# Patient Record
Sex: Male | Born: 1940
Health system: Southern US, Community
[De-identification: ages and names within clinical notes are randomized; demographics above are authoritative.]

## PROBLEM LIST (undated history)

## (undated) ENCOUNTER — Emergency Department (HOSPITAL_COMMUNITY): Admission: EM | Discharge status: Absent without leave | Payer: Medicare Other

## (undated) DIAGNOSIS — R3914 Feeling of incomplete bladder emptying: Secondary | ICD-10-CM

## (undated) DIAGNOSIS — J439 Emphysema, unspecified: Secondary | ICD-10-CM

## (undated) DIAGNOSIS — K219 Gastro-esophageal reflux disease without esophagitis: Secondary | ICD-10-CM

## (undated) DIAGNOSIS — E119 Type 2 diabetes mellitus without complications: Secondary | ICD-10-CM

## (undated) DIAGNOSIS — N200 Calculus of kidney: Secondary | ICD-10-CM

## (undated) DIAGNOSIS — Z923 Personal history of irradiation: Secondary | ICD-10-CM

## (undated) DIAGNOSIS — F419 Anxiety disorder, unspecified: Secondary | ICD-10-CM

## (undated) DIAGNOSIS — I714 Abdominal aortic aneurysm, without rupture: Secondary | ICD-10-CM

## (undated) DIAGNOSIS — M48061 Spinal stenosis, lumbar region without neurogenic claudication: Secondary | ICD-10-CM

## (undated) DIAGNOSIS — C61 Malignant neoplasm of prostate: Secondary | ICD-10-CM

## (undated) DIAGNOSIS — Z77098 Contact with and (suspected) exposure to other hazardous, chiefly nonmedicinal, chemicals: Secondary | ICD-10-CM

## (undated) DIAGNOSIS — M810 Age-related osteoporosis without current pathological fracture: Secondary | ICD-10-CM

## (undated) DIAGNOSIS — F32A Depression, unspecified: Secondary | ICD-10-CM

## (undated) DIAGNOSIS — Z973 Presence of spectacles and contact lenses: Secondary | ICD-10-CM

## (undated) DIAGNOSIS — R0609 Other forms of dyspnea: Secondary | ICD-10-CM

## (undated) DIAGNOSIS — K449 Diaphragmatic hernia without obstruction or gangrene: Secondary | ICD-10-CM

## (undated) DIAGNOSIS — C3492 Malignant neoplasm of unspecified part of left bronchus or lung: Secondary | ICD-10-CM

## (undated) DIAGNOSIS — F329 Major depressive disorder, single episode, unspecified: Secondary | ICD-10-CM

## (undated) DIAGNOSIS — M109 Gout, unspecified: Secondary | ICD-10-CM

## (undated) DIAGNOSIS — E1140 Type 2 diabetes mellitus with diabetic neuropathy, unspecified: Secondary | ICD-10-CM

## (undated) DIAGNOSIS — Z87442 Personal history of urinary calculi: Secondary | ICD-10-CM

## (undated) DIAGNOSIS — E785 Hyperlipidemia, unspecified: Secondary | ICD-10-CM

## (undated) DIAGNOSIS — N401 Enlarged prostate with lower urinary tract symptoms: Secondary | ICD-10-CM

## (undated) DIAGNOSIS — Z972 Presence of dental prosthetic device (complete) (partial): Secondary | ICD-10-CM

## (undated) DIAGNOSIS — Z87448 Personal history of other diseases of urinary system: Secondary | ICD-10-CM

## (undated) DIAGNOSIS — R0600 Dyspnea, unspecified: Secondary | ICD-10-CM

## (undated) DIAGNOSIS — C3491 Malignant neoplasm of unspecified part of right bronchus or lung: Secondary | ICD-10-CM

## (undated) DIAGNOSIS — J701 Chronic and other pulmonary manifestations due to radiation: Secondary | ICD-10-CM

## (undated) DIAGNOSIS — Z8679 Personal history of other diseases of the circulatory system: Secondary | ICD-10-CM

## (undated) DIAGNOSIS — N323 Diverticulum of bladder: Secondary | ICD-10-CM

## (undated) DIAGNOSIS — Z86718 Personal history of other venous thrombosis and embolism: Secondary | ICD-10-CM

## (undated) DIAGNOSIS — Z860100 Personal history of colon polyps, unspecified: Secondary | ICD-10-CM

## (undated) DIAGNOSIS — I10 Essential (primary) hypertension: Secondary | ICD-10-CM

## (undated) DIAGNOSIS — Z8719 Personal history of other diseases of the digestive system: Secondary | ICD-10-CM

## (undated) DIAGNOSIS — M1990 Unspecified osteoarthritis, unspecified site: Secondary | ICD-10-CM

## (undated) DIAGNOSIS — H259 Unspecified age-related cataract: Secondary | ICD-10-CM

## (undated) DIAGNOSIS — I7143 Infrarenal abdominal aortic aneurysm, without rupture: Secondary | ICD-10-CM

## (undated) DIAGNOSIS — K5909 Other constipation: Secondary | ICD-10-CM

## (undated) DIAGNOSIS — N319 Neuromuscular dysfunction of bladder, unspecified: Secondary | ICD-10-CM

## (undated) DIAGNOSIS — N529 Male erectile dysfunction, unspecified: Secondary | ICD-10-CM

## (undated) DIAGNOSIS — Z7739 Contact with and (suspected) exposure to other war theater: Secondary | ICD-10-CM

## (undated) HISTORY — PX: CYSTOLITHOTOMY: SHX1421

## (undated) HISTORY — PX: CYSTOSCOPY W/ LITHOLAPAXY / EHL: SUR377

## (undated) HISTORY — DX: Diaphragmatic hernia without obstruction or gangrene: K44.9

## (undated) HISTORY — DX: Male erectile dysfunction, unspecified: N52.9

## (undated) HISTORY — DX: Age-related osteoporosis without current pathological fracture: M81.0

## (undated) HISTORY — PX: COLONOSCOPY: SHX174

## (undated) HISTORY — DX: Essential (primary) hypertension: I10

## (undated) HISTORY — DX: Personal history of colonic polyps: Z86.010

## (undated) HISTORY — DX: Personal history of colon polyps, unspecified: Z86.0100

## (undated) HISTORY — DX: Spinal stenosis, lumbar region without neurogenic claudication: M48.061

## (undated) HISTORY — DX: Hyperlipidemia, unspecified: E78.5

## (undated) HISTORY — DX: Unspecified osteoarthritis, unspecified site: M19.90

## (undated) HISTORY — PX: OTHER SURGICAL HISTORY: SHX169

---

## 2003-03-12 ENCOUNTER — Emergency Department (HOSPITAL_COMMUNITY): Admission: EM | Admit: 2003-03-12 | Discharge: 2003-03-12 | Payer: Self-pay | Admitting: Emergency Medicine

## 2004-01-31 ENCOUNTER — Ambulatory Visit: Payer: Self-pay | Admitting: Family Medicine

## 2004-11-13 ENCOUNTER — Ambulatory Visit: Payer: Self-pay | Admitting: Family Medicine

## 2004-11-16 ENCOUNTER — Encounter: Payer: Self-pay | Admitting: Family Medicine

## 2004-11-17 ENCOUNTER — Encounter (INDEPENDENT_AMBULATORY_CARE_PROVIDER_SITE_OTHER): Payer: Self-pay | Admitting: *Deleted

## 2004-11-17 LAB — CONVERTED CEMR LAB: PSA: 0.64 ng/mL

## 2004-11-23 ENCOUNTER — Ambulatory Visit: Payer: Self-pay | Admitting: Family Medicine

## 2004-11-28 ENCOUNTER — Ambulatory Visit: Payer: Self-pay | Admitting: Family Medicine

## 2004-12-11 ENCOUNTER — Ambulatory Visit (HOSPITAL_COMMUNITY): Admission: RE | Admit: 2004-12-11 | Discharge: 2004-12-11 | Payer: Self-pay | Admitting: Family Medicine

## 2004-12-11 ENCOUNTER — Ambulatory Visit: Payer: Self-pay | Admitting: *Deleted

## 2005-03-13 ENCOUNTER — Ambulatory Visit: Payer: Self-pay | Admitting: Family Medicine

## 2005-07-11 ENCOUNTER — Ambulatory Visit: Payer: Self-pay | Admitting: Family Medicine

## 2005-11-12 ENCOUNTER — Ambulatory Visit: Payer: Self-pay | Admitting: Family Medicine

## 2006-05-02 ENCOUNTER — Emergency Department (HOSPITAL_COMMUNITY): Admission: EM | Admit: 2006-05-02 | Discharge: 2006-05-02 | Payer: Self-pay | Admitting: Emergency Medicine

## 2006-05-06 ENCOUNTER — Ambulatory Visit: Payer: Self-pay | Admitting: Family Medicine

## 2006-07-22 ENCOUNTER — Ambulatory Visit: Payer: Self-pay | Admitting: Family Medicine

## 2006-07-23 ENCOUNTER — Encounter: Payer: Self-pay | Admitting: Family Medicine

## 2006-10-07 ENCOUNTER — Ambulatory Visit: Payer: Self-pay | Admitting: Family Medicine

## 2006-10-30 ENCOUNTER — Ambulatory Visit: Payer: Self-pay | Admitting: Family Medicine

## 2006-11-11 ENCOUNTER — Encounter: Payer: Self-pay | Admitting: Family Medicine

## 2006-11-11 LAB — CONVERTED CEMR LAB
BUN: 17 mg/dL (ref 6–23)
CO2: 25 meq/L (ref 19–32)
Calcium: 9.3 mg/dL (ref 8.4–10.5)
Chloride: 102 meq/L (ref 96–112)
Cholesterol: 171 mg/dL (ref 0–200)
Creatinine, Ser: 1.43 mg/dL (ref 0.40–1.50)
Glucose, Bld: 95 mg/dL (ref 70–99)
HDL: 35 mg/dL — ABNORMAL LOW (ref >39)
LDL Cholesterol: 108 mg/dL — ABNORMAL HIGH (ref 0–99)
Potassium: 3.7 meq/L (ref 3.5–5.3)
Sodium: 138 meq/L (ref 135–145)
Total CHOL/HDL Ratio: 4.9
Triglycerides: 140 mg/dL (ref <150)
VLDL: 28 mg/dL (ref 0–40)

## 2006-12-03 ENCOUNTER — Ambulatory Visit: Payer: Self-pay | Admitting: Family Medicine

## 2007-01-06 ENCOUNTER — Ambulatory Visit: Payer: Self-pay | Admitting: Family Medicine

## 2007-02-24 ENCOUNTER — Ambulatory Visit (HOSPITAL_BASED_OUTPATIENT_CLINIC_OR_DEPARTMENT_OTHER): Admission: RE | Admit: 2007-02-24 | Discharge: 2007-02-24 | Payer: Self-pay | Admitting: Urology

## 2007-02-25 ENCOUNTER — Ambulatory Visit: Payer: Self-pay | Admitting: Family Medicine

## 2007-04-15 ENCOUNTER — Ambulatory Visit: Payer: Self-pay | Admitting: Family Medicine

## 2007-07-22 ENCOUNTER — Encounter: Payer: Self-pay | Admitting: *Deleted

## 2007-07-22 ENCOUNTER — Ambulatory Visit: Payer: Self-pay | Admitting: Family Medicine

## 2007-07-22 ENCOUNTER — Encounter (INDEPENDENT_AMBULATORY_CARE_PROVIDER_SITE_OTHER): Payer: Self-pay | Admitting: *Deleted

## 2007-07-22 DIAGNOSIS — I10 Essential (primary) hypertension: Secondary | ICD-10-CM | POA: Insufficient documentation

## 2007-07-22 DIAGNOSIS — E669 Obesity, unspecified: Secondary | ICD-10-CM | POA: Insufficient documentation

## 2007-07-22 DIAGNOSIS — F528 Other sexual dysfunction not due to a substance or known physiological condition: Secondary | ICD-10-CM | POA: Insufficient documentation

## 2007-07-22 DIAGNOSIS — E785 Hyperlipidemia, unspecified: Secondary | ICD-10-CM | POA: Insufficient documentation

## 2007-07-28 ENCOUNTER — Ambulatory Visit (HOSPITAL_COMMUNITY): Admission: RE | Admit: 2007-07-28 | Discharge: 2007-07-28 | Payer: Self-pay | Admitting: Family Medicine

## 2007-08-11 ENCOUNTER — Ambulatory Visit: Payer: Self-pay | Admitting: Family Medicine

## 2007-08-11 ENCOUNTER — Encounter: Payer: Self-pay | Admitting: Family Medicine

## 2007-08-12 DIAGNOSIS — F17200 Nicotine dependence, unspecified, uncomplicated: Secondary | ICD-10-CM | POA: Insufficient documentation

## 2007-09-12 ENCOUNTER — Ambulatory Visit: Payer: Self-pay | Admitting: Family Medicine

## 2007-09-12 LAB — CONVERTED CEMR LAB
Basophils Absolute: 0 10*3/uL (ref 0.0–0.1)
Basophils Relative: 1 % (ref 0–1)
Eosinophils Absolute: 0.2 10*3/uL (ref 0.0–0.7)
Eosinophils Relative: 3 % (ref 0–5)
HCT: 43.9 % (ref 39.0–52.0)
Hemoglobin: 14.3 g/dL (ref 13.0–17.0)
Lymphocytes Relative: 37 % (ref 12–46)
Lymphs Abs: 2.3 10*3/uL (ref 0.7–4.0)
MCHC: 32.6 g/dL (ref 30.0–36.0)
MCV: 98.7 fL (ref 78.0–100.0)
Monocytes Absolute: 0.5 10*3/uL (ref 0.1–1.0)
Monocytes Relative: 7 % (ref 3–12)
Neutro Abs: 3.3 10*3/uL (ref 1.7–7.7)
Neutrophils Relative %: 53 % (ref 43–77)
PSA: 0.74 ng/mL (ref 0.10–4.00)
Platelets: 165 10*3/uL (ref 150–400)
RBC: 4.45 M/uL (ref 4.22–5.81)
RDW: 14.4 % (ref 11.5–15.5)
WBC: 6.2 10*3/uL (ref 4.0–10.5)

## 2007-09-15 ENCOUNTER — Encounter: Payer: Self-pay | Admitting: Family Medicine

## 2007-09-15 LAB — CONVERTED CEMR LAB: Hgb A1c MFr Bld: 6.2 % — ABNORMAL HIGH (ref 4.6–6.1)

## 2007-09-25 ENCOUNTER — Ambulatory Visit (HOSPITAL_COMMUNITY): Payer: Self-pay | Admitting: Psychiatry

## 2007-10-11 ENCOUNTER — Emergency Department (HOSPITAL_COMMUNITY): Admission: EM | Admit: 2007-10-11 | Discharge: 2007-10-11 | Payer: Self-pay | Admitting: Emergency Medicine

## 2007-10-14 ENCOUNTER — Ambulatory Visit (HOSPITAL_COMMUNITY): Admission: RE | Admit: 2007-10-14 | Discharge: 2007-10-14 | Payer: Self-pay | Admitting: Family Medicine

## 2007-10-14 ENCOUNTER — Ambulatory Visit: Payer: Self-pay | Admitting: Family Medicine

## 2007-10-16 ENCOUNTER — Ambulatory Visit (HOSPITAL_COMMUNITY): Payer: Self-pay | Admitting: Psychiatry

## 2007-11-13 ENCOUNTER — Ambulatory Visit (HOSPITAL_COMMUNITY): Payer: Self-pay | Admitting: Psychiatry

## 2007-12-05 DIAGNOSIS — N319 Neuromuscular dysfunction of bladder, unspecified: Secondary | ICD-10-CM | POA: Insufficient documentation

## 2008-02-13 ENCOUNTER — Telehealth: Payer: Self-pay | Admitting: Family Medicine

## 2008-02-27 ENCOUNTER — Ambulatory Visit: Payer: Self-pay | Admitting: Family Medicine

## 2008-02-27 DIAGNOSIS — F3289 Other specified depressive episodes: Secondary | ICD-10-CM | POA: Insufficient documentation

## 2008-02-27 DIAGNOSIS — F329 Major depressive disorder, single episode, unspecified: Secondary | ICD-10-CM | POA: Insufficient documentation

## 2008-02-27 DIAGNOSIS — R079 Chest pain, unspecified: Secondary | ICD-10-CM | POA: Insufficient documentation

## 2008-03-02 ENCOUNTER — Encounter: Payer: Self-pay | Admitting: Family Medicine

## 2008-03-02 ENCOUNTER — Telehealth: Payer: Self-pay | Admitting: Family Medicine

## 2008-03-15 ENCOUNTER — Ambulatory Visit: Payer: Self-pay | Admitting: Cardiology

## 2008-04-01 ENCOUNTER — Encounter: Payer: Self-pay | Admitting: Family Medicine

## 2008-04-01 ENCOUNTER — Ambulatory Visit: Payer: Self-pay | Admitting: Family Medicine

## 2008-06-21 ENCOUNTER — Emergency Department (HOSPITAL_COMMUNITY): Admission: EM | Admit: 2008-06-21 | Discharge: 2008-06-21 | Payer: Self-pay | Admitting: Emergency Medicine

## 2008-07-20 ENCOUNTER — Ambulatory Visit: Payer: Self-pay | Admitting: Family Medicine

## 2008-07-20 DIAGNOSIS — R5383 Other fatigue: Secondary | ICD-10-CM

## 2008-07-20 DIAGNOSIS — N3000 Acute cystitis without hematuria: Secondary | ICD-10-CM | POA: Insufficient documentation

## 2008-07-20 DIAGNOSIS — R5381 Other malaise: Secondary | ICD-10-CM | POA: Insufficient documentation

## 2008-07-24 DIAGNOSIS — G4700 Insomnia, unspecified: Secondary | ICD-10-CM | POA: Insufficient documentation

## 2008-07-26 ENCOUNTER — Ambulatory Visit: Payer: Self-pay | Admitting: Family Medicine

## 2008-07-27 ENCOUNTER — Encounter: Payer: Self-pay | Admitting: Family Medicine

## 2008-07-28 ENCOUNTER — Encounter: Payer: Self-pay | Admitting: Family Medicine

## 2008-07-29 LAB — CONVERTED CEMR LAB
BUN: 15 mg/dL (ref 6–23)
CO2: 26 meq/L (ref 19–32)
Calcium: 9.2 mg/dL (ref 8.4–10.5)
Chloride: 104 meq/L (ref 96–112)
Cholesterol: 165 mg/dL (ref 0–200)
Creatinine, Ser: 1.21 mg/dL (ref 0.40–1.50)
Glucose, Bld: 99 mg/dL (ref 70–99)
HDL: 35 mg/dL — ABNORMAL LOW (ref >39)
LDL Cholesterol: 105 mg/dL — ABNORMAL HIGH (ref 0–99)
Potassium: 4.1 meq/L (ref 3.5–5.3)
Sodium: 137 meq/L (ref 135–145)
Total CHOL/HDL Ratio: 4.7
Triglycerides: 123 mg/dL (ref <150)
VLDL: 25 mg/dL (ref 0–40)

## 2008-07-30 ENCOUNTER — Telehealth: Payer: Self-pay | Admitting: Family Medicine

## 2008-08-09 ENCOUNTER — Telehealth: Payer: Self-pay | Admitting: Family Medicine

## 2008-09-02 ENCOUNTER — Telehealth: Payer: Self-pay | Admitting: Family Medicine

## 2008-09-03 ENCOUNTER — Encounter: Payer: Self-pay | Admitting: Family Medicine

## 2008-10-04 ENCOUNTER — Encounter: Payer: Self-pay | Admitting: Family Medicine

## 2008-10-11 ENCOUNTER — Ambulatory Visit: Payer: Self-pay | Admitting: Family Medicine

## 2008-10-11 DIAGNOSIS — N323 Diverticulum of bladder: Secondary | ICD-10-CM | POA: Insufficient documentation

## 2008-10-26 ENCOUNTER — Ambulatory Visit: Payer: Self-pay | Admitting: Family Medicine

## 2008-10-26 DIAGNOSIS — J449 Chronic obstructive pulmonary disease, unspecified: Secondary | ICD-10-CM | POA: Insufficient documentation

## 2008-10-27 ENCOUNTER — Encounter: Payer: Self-pay | Admitting: Family Medicine

## 2008-10-27 LAB — CONVERTED CEMR LAB
BUN: 13 mg/dL (ref 6–23)
Basophils Absolute: 0 10*3/uL (ref 0.0–0.1)
Basophils Relative: 1 % (ref 0–1)
CO2: 22 meq/L (ref 19–32)
Calcium: 8.9 mg/dL (ref 8.4–10.5)
Chloride: 103 meq/L (ref 96–112)
Creatinine, Ser: 1.35 mg/dL (ref 0.40–1.50)
Eosinophils Absolute: 0.2 10*3/uL (ref 0.0–0.7)
Eosinophils Relative: 4 % (ref 0–5)
Glucose, Bld: 100 mg/dL — ABNORMAL HIGH (ref 70–99)
HCT: 43.5 % (ref 39.0–52.0)
Hemoglobin: 14.2 g/dL (ref 13.0–17.0)
Lymphocytes Relative: 37 % (ref 12–46)
Lymphs Abs: 2.2 10*3/uL (ref 0.7–4.0)
MCHC: 32.6 g/dL (ref 30.0–36.0)
MCV: 98.6 fL (ref 78.0–100.0)
Monocytes Absolute: 0.4 10*3/uL (ref 0.1–1.0)
Monocytes Relative: 7 % (ref 3–12)
Neutro Abs: 3.1 10*3/uL (ref 1.7–7.7)
Neutrophils Relative %: 52 % (ref 43–77)
PSA: 0.69 ng/mL (ref 0.10–4.00)
Platelets: 161 10*3/uL (ref 150–400)
Potassium: 4 meq/L (ref 3.5–5.3)
RBC: 4.41 M/uL (ref 4.22–5.81)
RDW: 13.5 % (ref 11.5–15.5)
Sodium: 138 meq/L (ref 135–145)
TSH: 0.893 microintl units/mL (ref 0.350–4.500)
WBC: 5.9 10*3/uL (ref 4.0–10.5)

## 2008-11-02 ENCOUNTER — Telehealth: Payer: Self-pay | Admitting: Family Medicine

## 2008-11-05 ENCOUNTER — Telehealth: Payer: Self-pay | Admitting: Family Medicine

## 2008-11-18 ENCOUNTER — Encounter: Payer: Self-pay | Admitting: Family Medicine

## 2008-11-27 ENCOUNTER — Encounter: Payer: Self-pay | Admitting: Family Medicine

## 2008-11-28 ENCOUNTER — Encounter: Payer: Self-pay | Admitting: Family Medicine

## 2008-11-29 ENCOUNTER — Encounter: Payer: Self-pay | Admitting: Family Medicine

## 2008-12-02 ENCOUNTER — Ambulatory Visit: Payer: Self-pay | Admitting: Family Medicine

## 2008-12-02 DIAGNOSIS — N3090 Cystitis, unspecified without hematuria: Secondary | ICD-10-CM | POA: Insufficient documentation

## 2008-12-03 ENCOUNTER — Encounter: Payer: Self-pay | Admitting: Family Medicine

## 2008-12-16 ENCOUNTER — Encounter: Payer: Self-pay | Admitting: Family Medicine

## 2009-02-02 ENCOUNTER — Telehealth: Payer: Self-pay | Admitting: Family Medicine

## 2009-03-07 ENCOUNTER — Encounter: Payer: Self-pay | Admitting: Family Medicine

## 2009-04-01 ENCOUNTER — Ambulatory Visit: Payer: Self-pay | Admitting: Family Medicine

## 2009-04-02 DIAGNOSIS — K5900 Constipation, unspecified: Secondary | ICD-10-CM | POA: Insufficient documentation

## 2009-04-02 DIAGNOSIS — K5909 Other constipation: Secondary | ICD-10-CM | POA: Insufficient documentation

## 2009-04-08 ENCOUNTER — Encounter: Payer: Self-pay | Admitting: Family Medicine

## 2009-05-16 ENCOUNTER — Telehealth: Payer: Self-pay | Admitting: Family Medicine

## 2009-06-06 ENCOUNTER — Encounter: Payer: Self-pay | Admitting: Family Medicine

## 2009-07-27 ENCOUNTER — Encounter: Payer: Self-pay | Admitting: Family Medicine

## 2009-07-28 ENCOUNTER — Encounter: Payer: Self-pay | Admitting: Family Medicine

## 2009-09-08 ENCOUNTER — Telehealth: Payer: Self-pay | Admitting: Family Medicine

## 2009-09-09 ENCOUNTER — Encounter: Payer: Self-pay | Admitting: Family Medicine

## 2009-09-12 ENCOUNTER — Encounter: Payer: Self-pay | Admitting: Family Medicine

## 2009-09-22 ENCOUNTER — Telehealth: Payer: Self-pay | Admitting: Family Medicine

## 2009-09-23 ENCOUNTER — Encounter: Payer: Self-pay | Admitting: Family Medicine

## 2009-10-03 ENCOUNTER — Ambulatory Visit: Payer: Self-pay | Admitting: Family Medicine

## 2009-10-07 ENCOUNTER — Telehealth: Payer: Self-pay | Admitting: Family Medicine

## 2009-10-12 ENCOUNTER — Encounter: Payer: Self-pay | Admitting: Family Medicine

## 2009-12-28 ENCOUNTER — Ambulatory Visit: Payer: Self-pay | Admitting: Family Medicine

## 2009-12-29 ENCOUNTER — Encounter: Payer: Self-pay | Admitting: Family Medicine

## 2010-01-02 ENCOUNTER — Encounter: Payer: Self-pay | Admitting: Family Medicine

## 2010-01-09 ENCOUNTER — Encounter: Payer: Self-pay | Admitting: Cardiology

## 2010-01-11 ENCOUNTER — Encounter: Payer: Self-pay | Admitting: Cardiology

## 2010-01-12 ENCOUNTER — Encounter: Payer: Self-pay | Admitting: Cardiology

## 2010-01-13 ENCOUNTER — Ambulatory Visit: Payer: Self-pay | Admitting: Cardiology

## 2010-01-23 ENCOUNTER — Encounter: Payer: Self-pay | Admitting: Cardiology

## 2010-03-03 ENCOUNTER — Encounter: Payer: Self-pay | Admitting: Family Medicine

## 2010-03-06 ENCOUNTER — Telehealth: Payer: Self-pay | Admitting: Family Medicine

## 2010-04-03 ENCOUNTER — Ambulatory Visit
Admission: RE | Admit: 2010-04-03 | Discharge: 2010-04-03 | Payer: Self-pay | Source: Home / Self Care | Attending: Family Medicine | Admitting: Family Medicine

## 2010-04-03 ENCOUNTER — Encounter: Payer: Self-pay | Admitting: Family Medicine

## 2010-04-06 ENCOUNTER — Encounter: Payer: Self-pay | Admitting: Family Medicine

## 2010-04-17 ENCOUNTER — Encounter: Payer: Self-pay | Admitting: Family Medicine

## 2010-04-24 ENCOUNTER — Encounter: Payer: Self-pay | Admitting: Family Medicine

## 2010-04-25 NOTE — Miscellaneous (Signed)
Summary: refill  Clinical Lists Changes  Medications: Rx of VIAGRA 100 MG TABS (SILDENAFIL CITRATE) one tab by mouth once daily prn;  #5 x 0;  Signed;  Entered by: Everitt Amber;  Authorized by: Syliva Overman MD;  Method used: Electronically to [facility] [address], [place], [place], [name]  [address], Ph: [phone], Fax: [phone]    Prescriptions: VIAGRA 100 MG TABS (SILDENAFIL CITRATE) one tab by mouth once daily prn  #5 x 0   Entered by:   Everitt Amber   Authorized by:   Syliva Overman MD   Signed by:   Everitt Amber on 03/07/2009   Method used:   Electronically to        [facility] (retail)       [address]       [place], [name]  [address]       Ph: [phone]       Fax: [phone]   RxID:   [id number]
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
## 2010-04-25 NOTE — Assessment & Plan Note (Signed)
Summary: follow up/cnd   Vital Signs:  Patient profile:   70 year old male Height:      77 inches Weight:      280.44 pounds BMI:     33.38 O2 Sat:      92 % Pulse rate:   86 / minute Pulse rhythm:   regular Resp:     16 per minute BP sitting:   134 / 80  (left arm) Cuff size:   large  Vitals Entered By: Everitt Amber (December 02, 2008 8:59 AM)  Nutrition Counseling: Patient's BMI is greater than 25 and therefore counseled on weight management options. CC: Follow up from [facility], was in there 3 days Is Patient Diabetic? No   CC:  Follow up from [facility] and was in there 3 days.  History of Present Illness: Pt hospitalised at [facility] for urinary tract infection with a UTI and bronchitis from 09/04 to 09/06/ 2011. Pt states that the fault was with this office since hre did not get the antibiotcs for his August UTI until 3 days after .This was in August, review of the hospital records shows the final blood and urine cultures to show no growth, however his wbc was high and will need to be reptd. He also had an [name] of kidneys and bladder which showed bladder diverticulum.I will make him aware of th findings.  He is on prednisone and chantix and is now smoking on avg 7 per day.  States his big prob is with the bladder diverticulum, as it may rupture.Mar[place] KitchenHe [place] and plans to investigate this option.   He states the catheters from the mail are dong good for  him.    Current Medications (verified): 1)  Amlodipine Besylate 10 Mg Tabs (Amlodipine Besylate) .... Take 1 Tablet By Mouth Once A Day 2)  Maxzide-25 37.5-25 Mg Tabs (Triamterene-Hctz) .... One and Half Tablets Daily 3)  Symbicort 80-4.5 Mcg/act Aero (Budesonide-Formoterol Fumarate) .... 2 Puffs Twice Daily 4)  Proventil Hfa 108 (90 Base) Mcg/act Aers (Albuterol Sulfate) .... 2 Puffs Every 6 To 8 Hours As Needed 5)  Nebulizer Compressor  Misc (Nebulizers) .... Uad 6)   Duoneb 0.5-2.5 (3) Mg/29ml Soln (Ipratropium-Albuterol) .... Use Three Times A Day As Needed 7)  Levaquin 500 Mg Tabs (Levofloxacin) .... Take 1 Tablet By Mouth Once A Day 8)  Prednisone 20 Mg Tabs (Prednisone) .... 2 Daily For 5 Days, Then 1 Daily For 5 Days  Allergies (verified): No Known Drug Allergies  Review of Systems      See HPI General:  Complains of fatigue; denies chills and fever. ENT:  Denies hoarseness, nasal congestion, and sinus pressure. CV:  Denies chest pain or discomfort and palpitations. Resp:  See HPI. GI:  Denies abdominal pain, constipation, diarrhea, nausea, and vomiting. GU:  Complains of urinary frequency. MS:  Denies joint pain and stiffness. Derm:  Denies itching, lesion(s), and rash. Psych:  Complains of anxiety and depression; denies suicidal thoughts/plans and unusual visions or sounds; frustrated with poor health , wants definitive surgery for bladder diverticulum.  Physical Exam  General:  alert, well-hydrated, and overweight-appearing.  HEENT: No facial asymmetry,  EOMI, No sinus tenderness, TM's Clear, oropharynx  pink and moist.   Chest: Clear to auscultation bilaterally.  CVS: S1, S2, No murmurs, No S3.   Abd: Soft, Nontender.  MS: Adequate ROM spine, hips, shoulders and knees.  Ext: No edema.   CNS: CN 2-12  intact, power tone and sensation normal throughout.   Skin: Intact, no visible lesions or rashes.  Psych: Good eye contact, normal affect.  Memory intact, not anxious or depressed appearing.     Impression & Recommendations:  Problem # 1:  CYSTITIS, RECURRENT (ICD-595.9) Assessment Deteriorated  The following medications were removed from the medication list:    Macrodantin 100 Mg Caps (Nitrofurantoin macrocrystal) ..... One cap by mouth bid    Cipro 500 Mg Tabs (Ciprofloxacin hcl) ..... One tab by mouth bid His updated medication list for this problem includes:    Levaquin 500 Mg Tabs (Levofloxacin) .[name]... Take 1 tablet by mouth  once a day  Orders:  Urology Referral (Urology)  Problem # 2:  COPD (ICD-496) Assessment: Unchanged  His updated medication list for this problem includes:    Symbicort 80-4.5 Mcg/act Aero (Budesonide-formoterol fumarate) .[name]... 2 puffs twice daily    Proventil Hfa 108 (90 Base) Mcg/act Aers (Albuterol sulfate) .[name]... 2 puffs every 6 to 8 hours as needed    Duoneb 0.5-2.5 (3) Mg/47ml Soln (Ipratropium-albuterol) ..... Use three times a day as needed  Problem # 3:  DIVERTICULUM, BLADDER (ICD-596.3) Assessment: Unchanged pt actively seeking surgical correction  Problem # 4:  NICOTINE ADDICTION (ICD-305.1) Assessment: Improved  Encouraged smoking cessation and discussed different methods for smoking cessation. reports smoking on avg 7 per day  Problem # 5:  HYPERTENSION (ICD-401.9) Assessment: Improved  His updated medication list for this problem includes:    Amlodipine Besylate 10 Mg Tabs (Amlodipine besylate) .[name]... Take 1 tablet by mouth once a day    Maxzide-25 37.5-25 Mg Tabs (Triamterene-hctz) ..... One and half tablets daily  BP today: 134/80 Prior BP: 140/90 (10/26/2008)  Labs Reviewed: K+: 4.0 (10/26/2008) Creat: : 1.35 (10/26/2008)   Chol: 165 (07/28/2008)   HDL: 35 (07/28/2008)   LDL: 105 (07/28/2008)   TG: 123 (07/28/2008)  Complete Medication List: 1)  Amlodipine Besylate 10 Mg Tabs (Amlodipine besylate) .... Take 1 tablet by mouth once a day 2)  Maxzide-25 37.5-25 Mg Tabs (Triamterene-hctz) .... One and half tablets daily 3)  Symbicort 80-4.5 Mcg/act Aero (Budesonide-formoterol fumarate) .... 2 puffs twice daily 4)  Proventil Hfa 108 (90 Base) Mcg/act Aers (Albuterol sulfate) .... 2 puffs every 6 to 8 hours as needed 5)  Nebulizer Compressor Misc (Nebulizers) .... Uad 6)  Duoneb 0.5-2.5 (3) Mg/71ml Soln (Ipratropium-albuterol) .... Use three times a day as needed 7)  Levaquin 500 Mg Tabs (Levofloxacin) .... Take 1 tablet by mouth once a day 8)  Prednisone 20 Mg  Tabs (Prednisone) .... 2 daily for 5 days, then 1 daily for 5 days  Other Orders: T-Culture, Urine ([id number]) Neurology Referral (Neuro)  Patient Instructions: 1)  Please schedule a follow-up appointment in 3 months. 2)  You need to submit urine for culture every month .pls continue this until I hear from Dr. Alferd Apa 3)  I am referring you to Dr. Alferd Apa as a medical kidney specialist, for his opinion on managing your chronic infections.  Appended Document: follow up/cnd pls contact ptand let him knowi got the urine and blood culture reports from [facility], both had no infection in hthem, but his white cell count was high, he needs a rept CBC and diff when he goes to gethis urine re-tested at [facility], pls send over stamped order, not as a stat  Appended Document: follow up/cnd called patient, left message  Appended Document: follow up/cnd patient aware

## 2010-04-25 NOTE — Progress Notes (Signed)
Summary: [facility]   Imported By: Lind Guest 10/04/2009 10:31:10  _____________________________________________________________________  External Attachment:    Type:   Image     Comment:   External Document

## 2010-04-25 NOTE — Assessment & Plan Note (Signed)
Summary: follow up from er   Vital Signs:  Patient profile:   70 year old male Height:      77 inches (195.58 cm) Weight:      284.19 pounds (129.18 kg) BMI:     33.82 BSA:     2.60 Pulse rate:   82 / minute Pulse rhythm:   regular Resp:     16 per minute BP sitting:   180 / 96  (left arm)  Vitals Entered By: Worthy Keeler LPN (July 20, 2008 8:33 AM)  Nutrition Counseling: Patient's BMI is greater than 25 and therefore counseled on weight management options. CC: follow up chronic problems- problems sleeping Is Patient Diabetic? No Pain Assessment Patient in pain? no        CC:  follow up chronic problems- problems sleeping.  History of Present Illness: Pt currently off BP meds and he states hewas recently having floaters, and since stopping the meds the floaters gotbetter.iI advised the 2 were not at all related and that he needs to resume the medication. He is still in the courts regarding the bladder surgery and states he is getting help from good lawyers. He is sleeping poorly on avg 3 hours daily. He reports improvement in his symptoms of depression and anger however. He denies any recent fevr or chills. He does feel, as though he is likelybeginnjing to get another uTI, and states he has been to the ED on several occasions since his last oV with urinary symptoms. He has to  chronically self catheterise, and ios having difficulty getting supplies. He denies head or chest congestion. He dinies significant joint pain or stiffness.  He still smokes, and though he wants to quit , finds it near impossible at this time.  Preventive Screening-Counseling & Management     Smoking Cessation Counseling: yes  Current Medications (verified): 1)  None  Allergies (verified): No Known Drug Allergies  Review of Systems General:  Denies chills and fever. ENT:  Denies hoarseness, nasal congestion, postnasal drainage, and sinus pressure. CV:  Denies chest pain or discomfort,  difficulty breathing while lying down, palpitations, and swelling of feet. Resp:  Denies cough, sputum productive, and wheezing. GI:  Denies abdominal pain, constipation, diarrhea, nausea, and vomiting. GU:  Complains of dysuria. MS:  See HPI. Psych:  Complains of anxiety and depression; denies suicidal thoughts/plans, thoughts of violence, and unusual visions or sounds.  Physical Exam  General:  alert, well-hydrated, and overweight-appearing. HEENT: No facial asymmetry,  EOMI, No sinus tenderness, TM's Clear, oropharynx  pink and moist.   Chest: Clear to auscultation bilaterally.  CVS: S1, S2, No murmurs, No S3.   Abd: Soft, Nontender.  MS: Adequate ROM spine, hips, shoulders and knees.  Ext: No edema.   CNS: CN 2-12 intact, power tone and sensation normal throughout.   Skin: Intact, no visible lesions or rashes.  Psych: Good eye contact, normal affect.  Memory intact, not anxious or depressed appearing.      Impression & Recommendations:  Problem # 1:  ACUTE CYSTITIS (ICD-595.0) Assessment Comment Only  His updated medication list for this problem includes:    Levaquin 500 Mg Tabs (Levofloxacin) .[name]... Take 1 tablet by mouth once a day  Orders: T-Culture, Urine ([id number])  Encouraged to push clear liquids, get enough rest, and take acetaminophen as needed. To be seen in 10 days if no improvement, sooner if worse.  Problem # 2:  NICOTINE ADDICTION (ICD-305.1) Assessment: Unchanged  The following medications were removed from  the medication list:    Chantix Starting Month Pak 0.5 Mg X 11 & 1 Mg X 42 Misc (Varenicline tartrate) ..... Uad  Encouraged smoking cessation and discussed different methods for smoking cessation.   Problem # 3:  HYPERTENSION (ICD-401.9) Assessment: Deteriorated  The following medications were removed from the medication list:    Amlodipine Besylate 10 Mg Tabs (Amlodipine besylate) ..... One tab by mouth qd His updated medication list for this  problem includes:    Maxzide-25 37.5-25 Mg Tabs (Triamterene-hctz) .[name]... Take 1 tablet by mouth once a day  Orders: T-Basic Metabolic Panel [id number])  BP today: 180/96 Prior BP: 148/84 (04/01/2008)  Labs Reviewed: K+: 4.1 (09/12/2007) Creat: : 1.22 (09/12/2007)   Chol: 166 (09/12/2007)   HDL: 37 (09/12/2007)   LDL: 100 (09/12/2007)   TG: 146 (09/12/2007)  Problem # 4:  DYSLIPIDEMIA (ICD-272.4) Assessment: Comment Only  Orders: T-Lipid Profile ([id number])    HDL:37 (09/12/2007), 35 (11/11/2006)  LDL:100 (09/12/2007), 108 (11/11/2006)  Chol:166 (09/12/2007), 171 (11/11/2006)  Trig:146 (09/12/2007), 140 (11/11/2006)  Problem # 5:  OBESITY (ICD-278.00) Assessment: Deteriorated  Ht: 77 (07/20/2008)   Wt: 284.19 (07/20/2008)   BMI: 33.82 (07/20/2008)  Problem # 6:  INSOMNIA (ICD-780.52) Assessment: Comment Only  His updated medication list for this problem includes:    Zolpidem Tartrate 10 Mg Tabs (Zolpidem tartrate) .[name]... Take 1 tab by mouth at bedtime, pt already practicing good sleep hygiene, however expereiencing difficulty falling asleep and staying asleep.  Complete Medication List: 1)  Zolpidem Tartrate 10 Mg Tabs (Zolpidem tartrate) .... Take 1 tab by mouth at bedtime 2)  Levaquin 500 Mg Tabs (Levofloxacin) .... Take 1 tablet by mouth once a day 3)  Maxzide-25 37.5-25 Mg Tabs (Triamterene-hctz) .... Take 1 tablet by mouth once a day 4)  Flexeril 10 Mg Tabs (Cyclobenzaprine hcl) .... Take 1 tab by mouth at bedtime as needed  Other Orders: T-CBC w/Diff ([id number])  Patient Instructions: 1)  Nurse BP check next week Monday. 2)  Please schedule a follow-up appointment in 2 months.MD. 3)  You will start a new bP med today, it is sent in to your pharmacy. 4)  You will get a new med  for assistance with sleep at night . 5)  A med for urinary infection is being sent in, It is best for you to see a doctor if you  think you have an infection and get the urine sent for  testing for culture before you take the first levaquin tablet. 6)  Pls send a culture today. 7)  It is important that you exercise regularly at least 20 minutes 5 times a week. If you develop chest pain, have severe difficulty breathing, or feel very tired , stop exercising immediately and seek medical attention. 8)  You need to lose weight. Consider a lower calorie diet and regular exercise.  9)  Tobacco is very bad for your health and your loved ones! You Should stop smoking!. 10)  Stop Smoking Tips: Choose a Quit date. Cut down before the Quit date. decide what you will do as a substitute when you feel the urge to smoke(gum,toothpick,exercise). 11)  BMP prior to visit, ICD-9: 12)  Lipid Panel prior to visit, ICD-9:  fasting next week 13)  CBC w/ Diff prior to visit, ICD-9: Prescriptions: FLEXERIL 10 MG TABS (CYCLOBENZAPRINE HCL) Take 1 tab by mouth at bedtime as needed  #30 x 3   Entered and Authorized by:   Syliva Overman MD   Signed by:  Syliva Overman MD on 07/20/2008   Method used:   Electronically to        [facility] (retail)       [address]       [place], [name]  [address]       Ph: [phone]       Fax: [phone]   RxID:   [id number] MAXZIDE-25 37.5-25 MG TABS (TRIAMTERENE-HCTZ) Take 1 tablet by mouth once a day  #30 x 2   Entered and Authorized by:   Syliva Overman MD   Signed by:   Syliva Overman MD on 07/20/2008   Method used:   Electronically to        [facility] (retail)       [address]       [place], [name]  [address]       Ph: [phone]       Fax: [phone]   RxID:   [id number] LEVAQUIN 500 MG TABS (LEVOFLOXACIN) Take 1 tablet by mouth once a day  #7 x 0   Entered and Authorized by:   Syliva Overman MD   Signed by:   Syliva Overman MD on 07/20/2008   Method used:   Electronically to        [facility]       [address]       [place], [name]  [address]       Ph: [phone]       Fax: [phone]   RxID:   [id number] ZOLPIDEM TARTRATE 10 MG TABS (ZOLPIDEM TARTRATE) Take 1 tab by mouth at bedtime  #30 x 1   Entered and Authorized by:   Syliva Overman MD   Signed by:   Syliva Overman MD on 07/20/2008   Method used:   Printed then faxed to ...       [facility] (retail)       [address]       [place], [name]  [address]       Ph: [phone]       Fax: [phone]   RxID:   [id number]

## 2010-04-25 NOTE — Progress Notes (Signed)
Summary: [facility]   Imported By: Lind Guest 10/11/2008 09:48:32  _____________________________________________________________________  External Attachment:    Type:   Image     Comment:   External Document

## 2010-04-25 NOTE — Progress Notes (Signed)
Summary: speak with doc  Phone Note Call from Patient   Summary of Call: would like to speak with doc asap   [phone]    [phone] Initial call taken by: Rudene Anda,  September 02, 2008 11:49 AM  Follow-up for Phone Call        Pt states that he wants to come by outside of regular hours an independent medical opinion regarding his  medical condition, states hewants this copied and scannedinto the records here, andwanted me to read it, he is now reading it over the phone, he states he is upset about someof the wording in a report, I avised  him to leave the experts to do their job, and to try not to get upset about   it and to continue to livehis life. Follow-up by: Syliva Overman MD,  September 06, 2008 5:19 PM  Additional Follow-up for Phone Call Additional follow up Details #1::        the pt may bring some papers he wants me to read this is about some claim he is trying to settle, pls make acopy of the papers , lv in mmy box they will be scanned  into [facility] record Additional Follow-up by: Syliva Overman MD,  September 06, 2008 5:20 PM    Additional Follow-up for Phone Call Additional follow up Details #2::    called and left message for pt to call office back.  Follow-up by: Rudene Anda,  September 13, 2008 11:40 AM

## 2010-04-25 NOTE — Letter (Signed)
Summary: MEDICAL RELEASE  MEDICAL RELEASE   Imported By: Lind Guest 07/20/2008 09:00:42  _____________________________________________________________________  External Attachment:    Type:   Image     Comment:   External Document

## 2010-04-25 NOTE — Assessment & Plan Note (Signed)
Summary: BP CHECK  Nurse Visit   Vital Signs:  Patient Profile:   70 Years Old Male CC:      Nurse visit for BP check Height:     77 inches Weight:      279 pounds BMI:     33.20 O2 Sat:      96 % Pulse rate:   96 / minute BP sitting:   148 / 84  (left arm)             Comments BP check was 148/84     Prior Medications: DARVOCET-N 100 100-650 MG  TABS (PROPOXYPHENE N-APAP) one tab by mouth twice weekly as needed for headache VIAGRA 100 MG  TABS (SILDENAFIL CITRATE) Take 1 tablet by mouth once a day as needed AMLODIPINE BESYLATE 10 MG TABS (AMLODIPINE BESYLATE) ONE TAB by mouth QD CHANTIX STARTING MONTH PAK 0.5 MG X 11 & 1 MG X 42 MISC (VARENICLINE TARTRATE) UAD Current Allergies: No known allergies       ]

## 2010-04-25 NOTE — Progress Notes (Signed)
Summary: NOTES  Phone Note Call from Patient   Summary of Call: NEEDS ANOTHER HANDICAPP CARD FILLED OUT  AND ALSO  NEEDS A NOTE ABOUT HE HAS BREATHING MACHINE  AND OFFICE  NOTES SHOWING ABOUT BREATHING MACHINE FOR [employer] CALL BACK AT [phone] OR FAX IT TO HIM  NEXT APPT   7.13.11 8:45 Initial call taken by: Lind Guest,  September 08, 2009 2:43 PM  Follow-up for Phone Call        handicapped application completed and on bookcase for signature  not sure what is needed for [employer] Follow-up by: Adella Hare LPN,  September 08, 2009 3:11 PM  Additional Follow-up for Phone Call Additional follow up Details #1::        pls find out from pt where he got his neb machine from, which pharmacy, and where he is getting neb solution from, track this down then i can write letter, he does have copd , all I am able to see is the mdi's  Additional Follow-up by: Syliva Overman MD,  September 08, 2009 5:44 PM    Additional Follow-up for Phone Call Additional follow up Details #2::    patient uses [facility], has never had neb tx filled there nebs not filled since 10/2008 is requesting refill on this? Follow-up by: Adella Hare LPN,  September 12, 2009 1:18 PM  Additional Follow-up for Phone Call Additional follow up Details #3:: Details for Additional Follow-up Action Taken: pls refill the neb solution and type the letter i have written for him i will sign Additional Follow-up by: Syliva Overman MD,  September 12, 2009 4:12 PM  Prescriptions: DUONEB 0.5-2.5 (3) MG/3ML SOLN (IPRATROPIUM-ALBUTEROL) Use three times a day as needed  #60 x 0   Entered by:   Adella Hare LPN   Authorized by:   Syliva Overman MD   Signed by:   Adella Hare LPN on 47/82/9562   Method used:   Electronically to        [facility] (retail)       [address]       [place], [name]  [address]       Ph: [phone]       Fax: [phone]   RxID:   [id number]  rx sent and letter provided for  doctor's signature

## 2010-04-25 NOTE — Progress Notes (Signed)
[facility]  Phone Note Call from Patient   Summary of Call: needs [facility] as[facility] by today. he said call hime if there is a problem.  said this was very important [phone] Initial call taken by: Rudene Anda,  October 07, 2009 10:29 AM  Follow-up for Phone Call        form in your box Follow-up by: Adella Hare LPN,  October 07, 2009 10:51 AM  Additional Follow-up for Phone Call Additional follow up Details #1::        it has been signed off, ipls let him know Additional Follow-up by: Syliva Overman MD,  October 07, 2009 12:14 PM    Additional Follow-up for Phone Call Additional follow up Details #2::    form faxed, called patient, no answer Follow-up by: Adella Hare LPN,  October 07, 2009 1:26 PM

## 2010-04-25 NOTE — Progress Notes (Signed)
  Phone Note Call from Patient   Caller: Patient Summary of Call: patient wants to know if you can write spiriva for him to replace the nebulizers Initial call taken by: Adella Hare LPN,  September 22, 2009 1:42 PM  Follow-up for Phone Call        needs ov before meds are changed and has not been here for 6 months,pls let himknow  Follow-up by: Syliva Overman MD,  September 22, 2009 2:46 PM  Additional Follow-up for Phone Call Additional follow up Details #1::        NO ANSWER FAX MACHINE Additional Follow-up by: Lind Guest,  September 22, 2009 3:00 PM    Additional Follow-up for Phone Call Additional follow up Details #2::    FAX MACHINE AGAIN Follow-up by: Lind Guest,  September 23, 2009 8:28 AM  Additional Follow-up for Phone Call Additional follow up Details #3:: Details for Additional Follow-up Action Taken: fax machine again  send him a letter to call back Additional Follow-up by: Lind Guest,  September 23, 2009 1:32 PM

## 2010-04-25 NOTE — Assessment & Plan Note (Signed)
Summary: OV   Vital Signs:  Patient profile:   70 year old male Height:      77 inches Weight:      288 pounds BMI:     34.28 O2 Sat:      94 % Pulse rate:   86 / minute Pulse rhythm:   regular Resp:     16 per minute BP sitting:   140 / 90  (left arm) Cuff size:   xl  Vitals Entered By: Everitt Amber (October 26, 2008 8:08 AM)  Nutrition Counseling: Patient's BMI is greater than 25 and therefore counseled on weight management options. CC: wants to see about getting neb machine for bronchitis Is Patient Diabetic? No Pain Assessment Patient in pain? no        CC:  wants to see about getting neb machine for bronchitis.  History of Present Illness: Pt reports that  he has had inc cough, shortness of breath and wheezing over the last 4 to 5 days. Within the past 3 monbths he has had 2 ED  visitswith this complaint, and he states he has used a nb machine of a relative when he feels esp tight in the chest. He denies cough productive of yellow sputum. He denies nasal pressure and inc post nasal drainage. He deniues fver or chills. He reports good tolerance of his BP meds. He denies lightheadedness or leg cramps. he is trying to quit smoking as he realizes that this is a big part of the problem withhis breathing, he now smokes less than10 ciggs daily on avg,   Current Medications (verified): 1)  Maxzide-25 37.5-25 Mg Tabs (Triamterene-Hctz) .... Take 1 Tablet By Mouth Once A Day 2)  Amlodipine Besylate 10 Mg Tabs (Amlodipine Besylate) .... Take 1 Tablet By Mouth Once A Day  Allergies (verified): No Known Drug Allergies  Review of Systems      See HPI General:  Complains of fatigue; denies chills and fever. ENT:  Denies hoarseness, nasal congestion, sinus pressure, and sore throat. CV:  Denies chest pain or discomfort, palpitations, and swelling of feet. Resp:  See HPI. GI:  Denies abdominal pain, constipation, diarrhea, nausea, and vomiting. GU:  Complains of dysuria; denies  erectile dysfunction and urinary frequency; feeels as though his urine is infected and wants to be checked for infection.symptoms std approx 5 days ago, denies flank pain , fever or chills.. MS:  Complains of joint pain and stiffness. Psych:  Complains of anxiety, depression, irritability, and mental problems; denies suicidal thoughts/plans, thoughts of violence, and unusual visions or sounds. Endo:  Denies cold intolerance, excessive hunger, excessive thirst, excessive urination, heat intolerance, polyuria, and weight change.  Physical Exam  General:  alert, well-hydrated, and overweight-appearing.  HEENT: No facial asymmetry,  EOMI, No sinus tenderness, TM's Clear, oropharynx  pink and moist.   Chest: decreased air entry , few crackles, no wheezes  CVS: S1, S2, No murmurs, No S3.   Abd: Soft, Nontender.  MS: Adequate ROM spine, hips, shoulders and knees.  Ext: No edema.   CNS: CN 2-12 intact, power tone and sensation normal throughout.   Skin: Intact, no visible lesions or rashes.  Psych: Good eye contact, normal affect.  Memory intact, not anxious or depressed appearing.     Impression & Recommendations:  Problem # 1:  COPD (ICD-496) Assessment Deteriorated  His updated medication list for this problem includes:    Symbicort 80-4.5 Mcg/act Aero (Budesonide-formoterol fumarate) .[name]... 2 puffs twice daily  Proventil Hfa 108 (90 Base) Mcg/act Aers (Albuterol sulfate) .[name]... 2 puffs every 6 to 8 hours as needed    Duoneb 0.5-2.5 (3) Mg/14ml Soln (Ipratropium-albuterol) ..... Use three times a day as needed  Orders: Pulmonary Referral (Pulmonary)  Problem # 2:  NICOTINE ADDICTION (ICD-305.1) Assessment: Improved  Encouraged smoking cessation and discussed different methods for smoking cessation.   Problem # 3:  OBESITY (ICD-278.00) Assessment: Unchanged  Ht: 77 (10/26/2008)   Wt: 288 (10/26/2008)   BMI: 34.28 (10/26/2008)  Problem # 4:  HYPERTENSION (ICD-401.9) Assessment:  Improved  The following medications were removed from the medication list:    Maxzide-25 37.5-25 Mg Tabs (Triamterene-hctz) .[name]... Take 1 tablet by mouth once a day His updated medication list for this problem includes:    Amlodipine Besylate 10 Mg Tabs (Amlodipine besylate) .[name]... Take 1 tablet by mouth once a day    Maxzide-25 37.5-25 Mg Tabs (Triamterene-hctz) ..... One and half tablets daily  BP today: 140/90 Prior BP: 170/100 (10/11/2008)  Labs Reviewed: K+: 4.1 (07/28/2008) Creat: : 1.21 (07/28/2008)   Chol: 165 (07/28/2008)   HDL: 35 (07/28/2008)   LDL: 105 (07/28/2008)   TG: 123 (07/28/2008)  Problem # 5:  ACUTE CYSTITIS (ICD-595.0) Assessment: Comment Only  Orders: T-Culture, Urine ([id number])  Complete Medication List: 1)  Amlodipine Besylate 10 Mg Tabs (Amlodipine besylate) .... Take 1 tablet by mouth once a day 2)  Maxzide-25 37.5-25 Mg Tabs (Triamterene-hctz) .... One and half tablets daily 3)  Symbicort 80-4.5 Mcg/act Aero (Budesonide-formoterol fumarate) .... 2 puffs twice daily 4)  Proventil Hfa 108 (90 Base) Mcg/act Aers (Albuterol sulfate) .... 2 puffs every 6 to 8 hours as needed 5)  Nebulizer Compressor Misc (Nebulizers) .... Uad 6)  Duoneb 0.5-2.5 (3) Mg/48ml Soln (Ipratropium-albuterol) .... Use three times a day as needed  Patient Instructions: 1)  F/u in 5 weeks. 2)  You will be referred for lung function tests, and will start new meds for COPD. 3)  It is absolutely impt that you quit smoking. 4)  your bP is still too high, pls inc the maxzide to one and a half daily, new dosing instructions have been sent in Prescriptions: DUONEB 0.5-2.5 (3) MG/3ML SOLN (IPRATROPIUM-ALBUTEROL) Use three times a day as needed  #60 x 0   Entered by:   Everitt Amber   Authorized by:   Syliva Overman MD   Signed by:   Worthy Keeler LPN on 09/81/1914   Method used:   Handwritten   RxID:   [id number] PROVENTIL HFA 108 (90 BASE) MCG/ACT AERS (ALBUTEROL SULFATE) 2 puffs  every 6 to 8 hours as needed  #1 x 3   Entered and Authorized by:   Syliva Overman MD   Signed by:   Syliva Overman MD on 10/26/2008   Method used:   Electronically to        [facility] (retail)       [address]       [place], [name]  [address]       Ph: [phone]       Fax: [phone]   RxID:   [id number] SYMBICORT 80-4.5 MCG/ACT AERO (BUDESONIDE-FORMOTEROL FUMARATE) 2 puffs twice daily  #1 x 3   Entered and Authorized by:   Syliva Overman MD   Signed by:   Syliva Overman MD on 10/26/2008   Method used:   Electronically to        [facility] (retail)       [address]       [place], [name]  [address]       Ph: [phone]       Fax: [phone]   RxID:   [id number] MAXZIDE-25 37.5-25 MG TABS (TRIAMTERENE-HCTZ) one and half tablets daily  #45 x 3   Entered and Authorized by:   Syliva Overman MD   Signed by:   Syliva Overman MD on 10/26/2008   Method used:   Electronically to        [facility] (retail)       [address]       [place], [name]  [address]       Ph: [phone]       Fax: [phone]   RxID:   [id number]

## 2010-04-25 NOTE — Assessment & Plan Note (Signed)
Summary: office visit   Vital Signs:  Patient Profile:   70 Years Old Male Height:     77 inches Weight:      279 pounds BMI:     33.20 O2 Sat:      94 % Pulse rate:   94 / minute Resp:     16 per minute BP sitting:   148 / 84  (left arm)  Vitals Entered By: Everitt Amber (April 01, 2008 8:31 AM)                 Chief Complaint:  Follow up and BP check.    Current Allergies: No known allergies         Complete Medication List: 1)  Darvocet-n 100 100-650 Mg Tabs (Propoxyphene n-apap) .... One tab by mouth twice weekly as needed for headache 2)  Viagra 100 Mg Tabs (Sildenafil citrate) .... Take 1 tablet by mouth once a day as needed 3)  Amlodipine Besylate 10 Mg Tabs (Amlodipine besylate) .... One tab by mouth qd 4)  Chantix Starting Month Pak 0.5 Mg X 11 & 1 Mg X 42 Misc (Varenicline tartrate) .... Uad    ]Pt left the office against the advice of the nurse before i could re-check his BP and make med changes

## 2010-04-25 NOTE — Progress Notes (Signed)
Summary: RX  Phone Note Call from Patient   Summary of Call: NEEDS LEVAQUIN SEND TO [facility] IN [place]  HE FEELS THE INFECTION  CALL BACK AT [phone] OR [phone] Initial call taken by: Lind Guest,  February 02, 2009 1:42 PM  Follow-up for Phone Call        returned call, left message Follow-up by: Everitt Amber,  February 02, 2009 2:35 PM  Additional Follow-up for Phone Call Additional follow up Details #1::        Thinks he has a UTI. He was told as soon as he felt anything coming on to let dr know so he could get antibiotic. Wants [facility] in [place]. He has been feeling this way for about 5 days so he needs something now. Additional Follow-up by: Everitt Amber,  February 02, 2009 2:41 PM    Additional Follow-up for Phone Call Additional follow up Details #2::    called in levaquin 500 1 once daily #7 tabs per dr. Lodema Hong Follow-up by: Everitt Amber,  February 02, 2009 3:24 PM  Prescriptions: LEVAQUIN 500 MG TABS (LEVOFLOXACIN) Take 1 tablet by mouth once a day  #7 x 0   Entered by:   Everitt Amber   Authorized by:   Syliva Overman MD   Signed by:   Everitt Amber on 02/02/2009   Method used:   Telephoned to ...       [facility] ([facility]ail)       [address]       [place], [name]  [address]       Ph: [phone]       Fax: [phone]   RxID:   [id number]

## 2010-04-25 NOTE — Medication Information (Signed)
Summary: Tax adviser   Imported By: Lind Guest 10/27/2008 16:40:56  _____________________________________________________________________  External Attachment:    Type:   Image     Comment:   External Document

## 2010-04-25 NOTE — Assessment & Plan Note (Signed)
Summary: office visit   Vital Signs:  Patient profile:   70 year old male Height:      77 inches Weight:      286.56 pounds BMI:     34.10 O2 Sat:      94 % Pulse rate:   88 / minute Pulse rhythm:   regular Resp:     16 per minute BP sitting:   170 / 100  (left arm) Cuff size:   large  Vitals Entered By: Everitt Amber (October 11, 2008 7:56 AM) CC: Follow up chronic problems Is Patient Diabetic? No   CC:  Follow up chronic problems.  History of Present Illness: Ot. continues to try to get his bladder diverticular disease which per his report is worsening. He brings in a CT report from 10/09/2008  for scanning into his records and local review. Of note, there is mention of a small kidney stone, possible liver cyst and kidney cyst, aldso a small umbilical hernia.   he continues to experience severe mental stress over thesituation, and plans to go to today to see about how thingsare proceeding. he reports benmg treated for bronchitis twice in the past 1 month, denies any current symptoms. He has dysuria all the time. he reports mild ankle swelling. He is not exercising at all. He smokes half PPD   Current Medications (verified): 1)  Maxzide-25 37.5-25 Mg Tabs (Triamterene-Hctz) .... Take 1 Tablet By Mouth Once A Day  Allergies (verified): No Known Drug Allergies  Review of Systems General:  Complains of fatigue and sleep disorder; denies chills and fever. ENT:  Denies hoarseness, nasal congestion, sinus pressure, and sore throat. CV:  Complains of swelling of feet; denies chest pain or discomfort, difficulty breathing while lying down, palpitations, and shortness of breath with exertion; 2 pillows for yrs, mild ankle swelling. Resp:  Complains of cough, shortness of breath, sputum productive, and wheezing; Pt was i  the Ed twice in the past 4 weeks at [facility]  for bronchitis, this is now resolved, states his breathing treatment which helped, now he has an mDI. Smokes  half PPD. GI:  Denies abdominal pain, constipation, diarrhea, nausea, and vomiting. GU:  Complains of dysuria; looks cloudy, but does not want to have it checked today. Saw urology recently and has CT report which sh persitentqws persitent . MS:  Complains of joint pain and stiffness. Derm:  Denies lesion(s) and rash. Neuro:  Denies headaches and seizures. Psych:  Complains of anxiety, depression, easily tearful, and irritability; denies suicidal thoughts/plans, thoughts of violence, and unusual visions or sounds. Heme:  Denies abnormal bruising and bleeding.  Physical Exam  General:  alert, well-hydrated, and overweight-appearing.  HEENT: No facial asymmetry,  EOMI, No sinus tenderness, TM's Clear, oropharynx  pink and moist.   Chest: Clear to auscultation bilaterally.  CVS: S1, S2, No murmurs, No S3.   Abd: Soft, Nontender.  MS: Adequate ROM spine, hips, shoulders and knees.  Ext: trace edema.   CNS: CN 2-12 intact, power tone and sensation normal throughout.   Skin: Intact, no visible lesions or rashes.  Psych: Good eye contact, normal affect.  Memory intact, not anxious or depressed appearing.     Impression & Recommendations:  Problem # 1:  DEPRESSION (ICD-311) Assessment Unchanged no interest in therapy or medication at thistime, today pt appears to be in better spirits than at hisprevious visit, but cleearly the situation remains volatile  Problem # 2:  NICOTINE ADDICTION (ICD-305.1) Assessment: Improved  Encouraged smoking cessation and discussed different methods for smoking cessation.   Problem # 3:  OBESITY (ICD-278.00) Assessment: Deteriorated  Ht: 77 (10/11/2008)   Wt: 286.56 (10/11/2008)   BMI: 34.10 (10/11/2008)  Problem # 4:  HYPERTENSION (ICD-401.9) Assessment: Deteriorated  His updated medication list for this problem includes:    Maxzide-25 37.5-25 Mg Tabs (Triamterene-hctz) .[name]... Take 1 tablet by mouth once a day    Amlodipine Besylate 10 Mg Tabs  (Amlodipine besylate) .[name]... Take 1 tablet by mouth once a day  Orders: T-Basic Metabolic Panel [id number])  BP today: 170/100 Prior BP: 140/90 (07/26/2008)  Labs Reviewed: K+: 4.1 (07/28/2008) Creat: : 1.21 (07/28/2008)   Chol: 165 (07/28/2008)   HDL: 35 (07/28/2008)   LDL: 105 (07/28/2008)   TG: 123 (07/28/2008)  Problem # 5:  DIVERTICULUM, BLADDER (ICD-596.3) Assessment: Deteriorated  Complete Medication List: 1)  Maxzide-25 37.5-25 Mg Tabs (Triamterene-hctz) .... Take 1 tablet by mouth once a day 2)  Amlodipine Besylate 10 Mg Tabs (Amlodipine besylate) .... Take 1 tablet by mouth once a day  Other Orders: T-CBC w/Diff ([id number]) T-TSH ([id number]) T-PSA ([id number])  Patient Instructions: 1)  Nurse visit in 10 days for rept blood pressure check. 2)  MD visit in 3 months. 3)  TSH prior to visit, ICD-9: 4)  CBC w/ Diff prior to visit, ICD-9:  fasting in 10 days. 5)  Your bP today is 170/100 6)  PSA prior to visit, ICD-9: 7)  BMP prior to visit, ICD-9: 8)  You need to start anew med in addityion to what you are on, pls take the same time every day. Prescriptions: AMLODIPINE BESYLATE 10 MG TABS (AMLODIPINE BESYLATE) Take 1 tablet by mouth once a day  #30 x 3   Entered and Authorized by:   Syliva Overman MD   Signed by:   Syliva Overman MD on 10/11/2008   Method used:   Print then Give to Patient   RxID:   [id number]

## 2010-04-25 NOTE — Progress Notes (Signed)
  Phone Note From Pharmacy   Caller: [facility] Summary of Call: requesting advair, not on med list past or present Initial call taken by: Adella Hare LPN,  May 16, 2009 4:41 PM  Follow-up for Phone Call        i removed symbicort and sent in advair 9in its place let him know Follow-up by: Syliva Overman MD,  May 16, 2009 5:29 PM  Additional Follow-up for Phone Call Additional follow up Details #1::        patient didnt call, pharmacy sent request Additional Follow-up by: Adella Hare LPN,  May 17, 2009 11:31 AM    New/Updated Medications: ADVAIR DISKUS 100-50 MCG/DOSE AEPB (FLUTICASONE-SALMETEROL) one puff twice daily Prescriptions: ADVAIR DISKUS 100-50 MCG/DOSE AEPB (FLUTICASONE-SALMETEROL) one puff twice daily  #1 x 3   Entered and Authorized by:   Syliva Overman MD   Signed by:   Syliva Overman MD on 05/16/2009   Method used:   Electronically to        [facility] (retail)       [address]       [place], [name]  [address]       Ph: [phone]       Fax: [phone]   RxID:   [id number]

## 2010-04-25 NOTE — Medication Information (Signed)
Summary: Tax adviser   Imported By: Lind Guest 12/16/2008 16:54:24  _____________________________________________________________________  External Attachment:    Type:   Image     Comment:   External Document

## 2010-04-25 NOTE — Letter (Signed)
Summary: HANDICAPP CARD  HANDICAPP CARD   Imported By: Lind Guest 10/12/2009 10:27:30  _____________________________________________________________________  External Attachment:    Type:   Image     Comment:   External Document

## 2010-04-25 NOTE — Assessment & Plan Note (Signed)
Summary: bp check  Nurse Visit   Vital Signs:  Patient profile:   70 year old male BP sitting:   140 / 90  (left arm)  Vitals Entered By: Worthy Keeler LPN (Jul 26, 1476 12:09 PM)    Allergies: No Known Drug Allergies       Prescriptions: SEPTRA DS 800-160 MG TABS (SULFAMETHOXAZOLE-TRIMETHOPRIM) one tab by mouth every mon, wed, and fri  #12 x 5   Entered by:   Worthy Keeler LPN   Authorized by:   Syliva Overman MD   Signed by:   Worthy Keeler LPN on 29/56/2130   Method used:   Handwritten   RxID:   [id number] BAND TOUCHLESS PLUS FREE LUBRICATED URETHER CATHETER KIT uad  #59mnth x 5   Entered by:   Worthy Keeler LPN   Authorized by:   Syliva Overman MD   Signed by:   Worthy Keeler LPN on 32/44/0102   Method used:   Handwritten   RxID:   [id number]

## 2010-04-25 NOTE — Letter (Signed)
Summary: HANDICAPP CARD  HANDICAPP CARD   Imported By: Lind Guest 09/09/2009 08:52:54  _____________________________________________________________________  External Attachment:    Type:   Image     Comment:   External Document

## 2010-04-25 NOTE — Progress Notes (Signed)
Summary: NEED PRE OP PAPERS TODAY  Phone Note Other Incoming   Summary of Call: [facility] CALLED AND NEEDS THE PRE OP PAPERS FAXED OVER BY 2:00 TODAY HIS SURGERY IS TOMORROW FAX # IS [phone] Initial call taken by: Lind Guest,  March 02, 2008 11:52 AM  Follow-up for Phone Call        advise i still have lab reports the pt has not had his blood work, he has been referred to the cardiologist , we can fax his EKG which is abn I have referred him to cardiology also and am awaiting cardiac clearance Follow-up by: Syliva Overman MD,  March 02, 2008 1:51 PM  Additional Follow-up for Phone Call Additional follow up Details #1::        done Additional Follow-up by: Lind Guest,  March 02, 2008 2:12 PM

## 2010-04-25 NOTE — Medication Information (Signed)
Summary: Tax adviser   Imported By: Rudene Anda 09/03/2008 10:03:29  _____________________________________________________________________  External Attachment:    Type:   Image     Comment:   External Document

## 2010-04-25 NOTE — Letter (Signed)
Summary: Letter  Letter   Imported By: Lind Guest 01/02/2010 15:40:32  _____________________________________________________________________  External Attachment:    Type:   Image     Comment:   External Document

## 2010-04-25 NOTE — Assessment & Plan Note (Signed)
Vital Signs:  Patient Profile:   70 Years Old Male Height:     67 inches Weight:      282.3 pounds BMI:     44.37 Pulse rate:   94 / minute Resp:     16 per minute BP sitting:   148 / 98  (right arm)  Pt. in pain?   no  Vitals Entered By: Chipper Herb (Aug 11, 2007 8:04 AM)                  Chief Complaint:  f/u uncontrolled hypertension and other chronic probs..  History of Present Illness: The pt. has not taken the 2nd BP. med prescribed since he reports thar he feels weak ehen he does. He has tried to take both pills at the same time though the directions specify otherwise. He also c/o excessive urination with the diuretic, and since he has to self catherise to void, an alternate drug is clearly prefrrable. Jonathan [name] states the darvocet did help his headache, but he reports that lying still in a dark room is most beneficial. Jonathan [name] has an upcoming appy. with mental health in the next month, and he still experiences alot of anger over his medical care.  l      Current Allergies: No known allergies   Past Surgical History:    Bladder surgery for diveerticular disease in 02/2007.   Social History:    Retired    Single    current smoker    Alcohol use-no    Drug use-no    Current Smoker   Risk Factors:  Tobacco use:  current    Counseled to quit/cut down tobacco use:  yes   Review of Systems  ENT      Denies difficulty swallowing, hoarseness, nasal congestion, sinus pressure, and sore throat.  CV      Denies chest pain or discomfort, difficulty breathing at night, palpitations, shortness of breath with exertion, swelling of feet, and swelling of hands.  Resp      Denies cough, shortness of breath, sputum productive, and wheezing.  GI      Denies abdominal pain, bloody stools, constipation, diarrhea, and indigestion.  MS      Complains of joint pain.  Psych      Denies anxiety and depression.   Physical Exam  General:  overweight-appearing.   Head:     Normocephalic and atraumatic without obvious abnormalities. No apparent alopecia or balding. Ears:     R ear normal and L ear normal.   Nose:     no external deformity and no external erythema.   Mouth:     fair dentition.   Neck:     No deformities, masses, or tenderness noted. Chest Wall:     No deformities, masses, tenderness or gynecomastia noted. Lungs:     Normal respiratory effort, chest expands symmetrically. Lungs are clear to auscultation, no crackles or wheezes. Heart:     Normal rate and regular rhythm. S1 and S2 normal without gallop, murmur, click, rub or other extra sounds. Abdomen:     soft and non-tender.   Msk:     decreased ROM knees..      Impression & Recommendations:  Problem # 1:  OBESITY (ICD-278.00) Assessment: Deteriorated  Problem # 2:  DYSLIPIDEMIA (ICD-272.4)  Labs Reviewed: Chol: 171 (11/11/2006)   HDL: 35 (11/11/2006)   LDL: 108 (11/11/2006)   TG: 140 (11/11/2006) Fasting labs prior to next O/V.   Problem #  3:  HYPERTENSION (ICD-401.9)  The following medications were removed from the medication list:    Tekturna Hct 300-25 Mg Tabs (Aliskiren-hydrochlorothiazide) ..... One tab by mouth once daily  His updated medication list for this problem includes:    Amlodipine Besylate 5 Mg Tabs (Amlodipine besylate) ..... One tab by mouth once daily    Tekturna 300 Mg Tabs (Aliskiren fumarate) .[name]... 1 tab. daily   Problem # 4:  nicotine  Complete Medication List: 1)  Amlodipine Besylate 5 Mg Tabs (Amlodipine besylate) .... One tab by mouth once daily 2)  Darvocet-n 100 100-650 Mg Tabs (Propoxyphene n-apap) .... One tab by mouth twice weekly as needed for headache 3)  Tekturna 300 Mg Tabs (Aliskiren fumarate) .[name].. 1 tab. daily   Patient Instructions: 1)  Please schedule a follow-up appointment in 2 weeks. 2)  BMP prior to visit, ICD-9: 3)  Lipid Panel prior to visit, ICD-9: 4)  PSA prior to visit, ICD-9: 5)   Tobacco is very bad for your health and your loved ones! You Should stop smoking!. 6)  Stop Smoking Tips: Choose a Quit date. Cut down before the Quit date. decide what you will do as a substitute when you feel the urge to smoke(gum,toothpick,exercise). 7)  It is important that you exercise regularly at least 20 minutes 5 times a week. If you develop chest pain, have severe difficulty breathing, or feel very tired , stop exercising immediately and seek medical attention. 8)  You need to lose weight. Consider a lower calorie diet and regular exercise.  9)  Check your Blood Pressure regularly. If it is above: you should make an appointment.   ]

## 2010-04-25 NOTE — Miscellaneous (Signed)
Summary: labs  Clinical Lists Changes  Orders: Added new Test order of T-CBC w/Diff [phone]) - Signed

## 2010-04-25 NOTE — Miscellaneous (Signed)
  Clinical Lists Changes  Problems: Added new problem of SPECIAL SCREENING MALIGNANT NEOPLASM OF PROSTATE (ICD-V76.44) - Signed Orders: Added new Test order of T-Basic Metabolic Panel [phone]) - Signed Added new Test order of T-CBC w/Diff [phone]) - Signed Added new Test order of T-TSH [phone]) - Signed Added new Test order of T-PSA ([id number]) - Signed

## 2010-04-25 NOTE — Letter (Signed)
Summary: Letter  Letter   Imported By: Lind Guest 09/13/2009 13:12:55  _____________________________________________________________________  External Attachment:    Type:   Image     Comment:   External Document

## 2010-04-25 NOTE — Letter (Signed)
Summary: [facility] D[facility]C  DR. [facility]YA  [facility]  [facility]   Imported By: Zachary George 01/30/2010 11:55:32  _____________________________________________________________________  External Attachment:    Type:   Image     Comment:   External Document

## 2010-04-25 NOTE — Medication Information (Signed)
Summary: Tax adviser   Imported By: Lind Guest 10/26/2008 14:31:45  _____________________________________________________________________  External Attachment:    Type:   Image     Comment:   External Document

## 2010-04-25 NOTE — Letter (Signed)
Summary: Letter  Letter   Imported By: Lind Guest 09/23/2009 14:43:23  _____________________________________________________________________  External Attachment:    Type:   Image     Comment:   External Document

## 2010-04-25 NOTE — Progress Notes (Signed)
Summary: call  Phone Note Call from Patient   Summary of Call: took his ant. looking at the news and there is an [place] outbreak he has ecoli and he has ate at the [employer] in [address] and [employer] in [place] wants you to report this to the health department so they will know there is one case of ecoli here he eats at these places at least every other day  cell number is [phone] Initial call taken by: Lind Guest,  Jul 30, 2008 8:12 AM  Follow-up for Phone Call        let pt know that the reportable E coli has to do with vomitting and diarreah, intestinal infection, the infection he has a urinary infeaction which most often due to the same bacteria E.coli, so there is really no need to report his infection. Follow-up by: Syliva Overman MD,  Jul 30, 2008 8:39 AM  Additional Follow-up for Phone Call Additional follow up Details #1::        Returned call, left message Additional Follow-up by: Everitt Amber,  Jul 30, 2008 1:39 PM    Additional Follow-up for Phone Call Additional follow up Details #2::    Returned call, left message Follow-up by: Everitt Amber,  Aug 04, 2008 11:22 AM  Additional Follow-up for Phone Call Additional follow up Details #3:: Details for Additional Follow-up Action Taken: Cannot reach patient Additional Follow-up by: Everitt Amber,  Aug 05, 2008 11:13 AM

## 2010-04-25 NOTE — Letter (Signed)
Summary: [facility]   Imported By: Lind Guest 03/03/2010 09:16:38  _____________________________________________________________________  External Attachment:    Type:   Image     Comment:   External Document

## 2010-04-25 NOTE — Progress Notes (Signed)
Summary: rx  Phone Note Call from Patient   Summary of Call: needs a refill on vigra  [facility] [place]. Initial call taken by: Rudene Anda,  February 13, 2008 11:13 AM  Follow-up for Phone Call        ok refill x 6 Follow-up by: Syliva Overman MD,  February 13, 2008 1:37 PM  Additional Follow-up for Phone Call Additional follow up Details #1::        Sent to [facility] in [place] Additional Follow-up by: Everitt Amber,  February 13, 2008 2:16 PM      Prescriptions: VIAGRA 100 MG  TABS (SILDENAFIL CITRATE) Take 1 tablet by mouth once a day as needed  #5 x 3   Entered by:   Everitt Amber   Authorized by:   Syliva Overman MD   Signed by:   Everitt Amber on 02/13/2008   Method used:   Electronically to        [facility] [phone]* (retail)       [address]       [place], [name]  [address]       Ph: [phone] or [phone]       Fax: [phone]   RxID:   [id number] VIAGRA 100 MG  TABS (SILDENAFIL CITRATE) Take 1 tablet by mouth once a day as needed  #5 x 3   Entered by:   Everitt Amber   Authorized by:   Syliva Overman MD   Signed by:   Everitt Amber on 02/13/2008   Method used:   Electronically to        [facility] (retail)       [address]       [place], [name]  [address]       Ph: [phone]       Fax: [phone]   RxID:   [id number]

## 2010-04-25 NOTE — Letter (Signed)
Summary: Discharge Summary  Discharge Summary   Imported By: Lind Guest 12/29/2008 08:19:07  _____________________________________________________________________  External Attachment:    Type:   Image     Comment:   External Document

## 2010-04-25 NOTE — Progress Notes (Signed)
[facility]   Imported By: Lind Guest 10/05/2009 11:43:08  _____________________________________________________________________  External Attachment:    Type:   Image     Comment:   External Document

## 2010-04-25 NOTE — Assessment & Plan Note (Signed)
Summary: migraine headaches   Vital Signs:  Patient Profile:   70 Years Old Male Height:     282.7 inches Weight:      77.31 pounds BMI:     0.68 Pulse rate:   85 / minute Resp:     20 per minute BP sitting:   158 / 90  (left arm)  Pt. in pain?   yes    Location:   head    Intensity:   10    Type:       aching  Vitals Entered By: Worthy Keeler LPN (July 22, 2007 8:18 AM)                  Chief Complaint:  migraine headache.    Current Allergies: No known allergies         Complete Medication List: 1)  Amlodipine Besylate 5 Mg Tabs (Amlodipine besylate) .... One tab by mouth once daily 2)  Tekturna Hct 300-25 Mg Tabs (Aliskiren-hydrochlorothiazide) .... One tab by mouth once daily 3)  Darvocet-n 100 100-650 Mg Tabs (Propoxyphene n-apap) .... One tab by mouth twice weekly as needed for headache     ]

## 2010-04-25 NOTE — Letter (Signed)
Summary: Letter  Letter   Imported By: Lind Guest 04/12/2009 09:38:03  _____________________________________________________________________  External Attachment:    Type:   Image     Comment:   External Document

## 2010-04-25 NOTE — Assessment & Plan Note (Signed)
Summary: F UP   Vital Signs:  Patient profile:   70 year old male Height:      77 inches Weight:      281.75 pounds BMI:     33.53 O2 Sat:      94 % Pulse rate:   89 / minute Pulse rhythm:   regular Resp:     16 per minute BP sitting:   146 / 90  (left arm)  Vitals Entered By: Everitt Amber LPN (October 03, 2009 9:57 AM)  Nutrition Counseling: Patient's BMI is greater than 25 and therefore counseled on weight management options. CC: Follow up chronic problems   CC:  Follow up chronic problems.  History of Present Illness: Reports  that the has been doing fairly well. He has been having several evaluations by orhtopedics for joint pains, and has also seen a rheumatologist. The pt states that he has an upcoming hearing in [place] as far as the issue of his bladder diverticulum is concerned.  He also reports 3 episodes of flare up in his copd/bronchitis since he was last seen, 1 reportedly requiring hospitalisation in [place] Denies recent fever or chills. Denies sinus pressure, nasal congestion , ear pain or sore throat. Denies chest congestion, or cough productive of sputum. Denies chest pain, palpitations, PND, orthopnea or leg swelling. Denies abdominal pain, nausea, vomitting, diarrhea or constipation. Denies change in bowel movements or bloody stool. Pt has to self catheerise to void, he jhas had no UTI's for over 4 months , and he is currently asymptomatic. Reports chronic back pain with reduced mobility as well s pain in the kneesa and ankles. Denies headaches, vertigo, seizures. Continues to experience depression and anger of his poor health as it relates to bladder issues, in no therapy, still does not want to ever see a Equities trader in [place]. Denies  rash, lesions, or itch.     Current Medications (verified): 1)  Maxzide-25 37.5-25 Mg Tabs (Triamterene-Hctz) .... One and Half Tablets Daily 2)  Proventil Hfa 108 (90 Base) Mcg/act Aers (Albuterol Sulfate)  .... 2 Puffs Every 6 To 8 Hours As Needed 3)  Nebulizer Compressor  Misc (Nebulizers) .... Uad 4)  Duoneb 0.5-2.5 (3) Mg/23ml Soln (Ipratropium-Albuterol) .... Use Three Times A Day As Needed 5)  Amlodipine Besylate 5 Mg Tabs (Amlodipine Besylate) .... Take 1 Tablet By Mouth Once A Day 6)  Advair Diskus 100-50 Mcg/dose Aepb (Fluticasone-Salmeterol) .... One Puff Twice Daily  Allergies (verified): No Known Drug Allergies  Review of Systems      See HPI General:  Complains of fatigue and sleep disorder. Eyes:  Denies discharge and vision loss-1 eye. Resp:  Complains of cough, shortness of breath, and wheezing; pt reports having had 3n flare ups of copd, was hospitalised for 2 days at [facility]. GU:  noo bladder infection for the pasrt 3 months , and not on prophylactic antibiotics. MS:  Complains of joint pain and stiffness; right wrist pain and right ankle pain and stiffness being treated by [facility] the past 3 to4 months, also seeing a rheumatologist . Psych:  Complains of depression, easily angered, and thoughts of violence; denies suicidal thoughts/plans and unusual visions or sounds; still harboring violent thoughts intermittently. Endo:  Denies cold intolerance, excessive hunger, excessive urination, and polyuria. Heme:  Denies abnormal bruising and bleeding. Allergy:  Denies hives or rash and itching eyes.  Physical Exam  General:  Well-developed,overweight ,in no acute distress; alert,appropriate and cooperative throughout examination HEENT:  No facial asymmetry,  EOMI, No sinus tenderness, TM's Clear, oropharynx  pink and moist.   Chest: decreased air entry bilaterally, few wheezes, no crackles, adequate air entry CVS: S1, S2, No murmurs, No S3.   Abd: Soft, Nontender.  EA:[id number]  ROM spine, hips, shoulders and knees.  Ext: No edema.   CNS: CN 2-12 intact, power tone and sensation normal throughout.   Skin: Intact, no visible lesions or rashes.  Psych: Good  eye contact, normal affect.  Memory intact, not anxious or depressed appearing.    Impression & Recommendations:  Problem # 1:  CYSTITIS, RECURRENT (ICD-595.9) Assessment Improved  Problem # 2:  COPD (ICD-496) Assessment: Deteriorated  His updated medication list for this problem includes:    Proventil Hfa 108 (90 Base) Mcg/act Aers (Albuterol sulfate) .[name]... 2 puffs every 6 to 8 hours as needed    Duoneb 0.5-2.5 (3) Mg/73ml Soln (Ipratropium-albuterol) ..... Use three times a day as needed    Advair Diskus 100-50 Mcg/dose Aepb (Fluticasone-salmeterol) ..... One puff twice daily    Spiriva Handihaler 18 Mcg Caps (Tiotropium bromide monohydrate) ..... One puff once daily  Problem # 3:  DIVERTICULUM, BLADDER (ICD-596.3) Assessment: Unchanged  Problem # 4:  INSOMNIA (ICD-780.52) Assessment: Unchanged  Discussed sleep hygiene.   Problem # 5:  NICOTINE ADDICTION (ICD-305.1) Assessment: Unchanged  Problem # 6:  HYPERTENSION (ICD-401.9) Assessment: Deteriorated  The following medications were removed from the medication list:    Furosemide 20 Mg Tabs (Furosemide) .[name]... 1/2 tab as needed His updated medication list for this problem includes:    Maxzide-25 37.5-25 Mg Tabs (Triamterene-hctz) ..... One and half tablets daily    Amlodipine Besylate 5 Mg Tabs (Amlodipine besylate) .[name]... Take 1 tablet by mouth once a day  BP today: 146/90 Prior BP: 140/90 (04/01/2009)  Labs Reviewed: K+: 4.0 (10/26/2008) Creat: : 1.35 (10/26/2008)   Chol: 165 (07/28/2008)   HDL: 35 (07/28/2008)   LDL: 105 (07/28/2008)   TG: 123 (07/28/2008)  Complete Medication List: 1)  Maxzide-25 37.5-25 Mg Tabs (Triamterene-hctz) .... One and half tablets daily 2)  Proventil Hfa 108 (90 Base) Mcg/act Aers (Albuterol sulfate) .... 2 puffs every 6 to 8 hours as needed 3)  Nebulizer Compressor Misc (Nebulizers) .... Uad 4)  Duoneb 0.5-2.5 (3) Mg/66ml Soln (Ipratropium-albuterol) .... Use three times a day as  needed 5)  Amlodipine Besylate 5 Mg Tabs (Amlodipine besylate) .... Take 1 tablet by mouth once a day 6)  Advair Diskus 100-50 Mcg/dose Aepb (Fluticasone-salmeterol) .... One puff twice daily 7)  Spiriva Handihaler 18 Mcg Caps (Tiotropium bromide monohydrate) .... One puff once daily 8)  Viagra 100 Mg Tabs (Sildenafil citrate) .... One tablet 30 minutes before intercourse , maximum twice weekly  Patient Instructions: 1)  f/u in 5 months and 3 weeks 2)  It is important that you exercise regularly at least 20 minutes 5 times a week. If you develop chest pain, have severe difficulty breathing, or feel very tired , stop exercising immediately and seek medical attention. 3)  You need to lose weight. Consider a lower calorie diet and regular exercise.  Prescriptions: VIAGRA 100 MG TABS (SILDENAFIL CITRATE) one tablet 30 minutes before intercourse , maximum twice weekly  #7 x 5   Entered and Authorized by:   Syliva Overman MD   Signed by:   Syliva Overman MD on 10/03/2009   Method used:   Electronically to        [facility] (retail)  [address]       [place], [name]  [address]       Ph: [phone]       Fax: [phone]   RxID:   [id number] SPIRIVA HANDIHALER 18 MCG CAPS (TIOTROPIUM BROMIDE MONOHYDRATE) one puff once daily  #1 x 4   Entered and Authorized by:   Syliva Overman MD   Signed by:   Syliva Overman MD on 10/03/2009   Method used:   Electronically to        [facility] (retail)       [address]       [place], [name]  [address]       Ph: [phone]       Fax: [phone]   RxID:   [id number]

## 2010-04-25 NOTE — Progress Notes (Signed)
Summary: [facility]   Imported By: Lind Guest 10/04/2009 10:30:31  _____________________________________________________________________  External Attachment:    Type:   Image     Comment:   External Document

## 2010-04-25 NOTE — Letter (Signed)
Summary: Letter  Letter   Imported By: Lind Guest 12/06/2008 14:55:19  _____________________________________________________________________  External Attachment:    Type:   Image     Comment:   External Document

## 2010-04-25 NOTE — Letter (Signed)
Summary: medical release  medical release   Imported By: Lind Guest 10/05/2009 08:18:56  _____________________________________________________________________  External Attachment:    Type:   Image     Comment:   External Document

## 2010-04-25 NOTE — Consult Note (Signed)
Summary: [facility]  [facility]   Imported By: Zachary George 01/30/2010 11:47:01  _____________________________________________________________________  External Attachment:    Type:   Image     Comment:   External Document

## 2010-04-25 NOTE — Progress Notes (Signed)
Summary: no show appt  Phone Note Call from Patient   Summary of Call: he was a no show for his pft Initial call taken by: Lind Guest,  November 05, 2008 11:10 AM  Follow-up for Phone Call        noted Follow-up by: Syliva Overman MD,  November 05, 2008 11:12 AM

## 2010-04-25 NOTE — Letter (Signed)
Summary: medical release  medical release   Imported By: Lind Guest 10/05/2009 08:18:30  _____________________________________________________________________  External Attachment:    Type:   Image     Comment:   External Document

## 2010-04-25 NOTE — Progress Notes (Signed)
Summary: [facility]   Imported By: Lind Guest 10/11/2008 08:02:44  _____________________________________________________________________  External Attachment:    Type:   Image     Comment:   External Document

## 2010-04-25 NOTE — Assessment & Plan Note (Signed)
Summary: follow up   Vital Signs:  Patient profile:   70 year old male Height:      77 inches Weight:      280.75 pounds BMI:     33.41 O2 Sat:      93 % on Room air Pulse rate:   96 / minute Pulse rhythm:   regular Resp:     16 per minute BP sitting:   146 / 90  (left arm)  Vitals Entered By: Adella Hare LPN (December 28, 2009 8:43 AM)  Nutrition Counseling: Patient's BMI is greater than 25 and therefore counseled on weight management options.  O2 Flow:  Room air CC: follow up Is Patient Diabetic? No Pain Assessment Patient in pain? no      Comments did not bring meds to ov but reviewed with patient from our list   CC:  follow up.  History of Present Illness: Pt hospitalised 9/27 to 9/29 for UTI, reported by him with sepsis, d/c summary states admit WBC was 16109.Has appt with urologist today in [place], and wants to go to [facility] for surgical opinion, he is requesting a letter from me in support of this decision. he has been troubled by recurrent UTI's some requiringhospitalisation, and has to self catherise. Though his BP is noted to be elevated, he is resistant to med adjustment, stating that it was fie in the hospial. He continues to have anxiety over his health, but appears less angry and depresed.   Current Medications (verified): 1)  Maxzide-25 37.5-25 Mg Tabs (Triamterene-Hctz) .... One and Half Tablets Daily 2)  Nebulizer Compressor  Misc (Nebulizers) .... Uad 3)  Duoneb 0.5-2.5 (3) Mg/59ml Soln (Ipratropium-Albuterol) .... Use Three Times A Day As Needed 4)  Advair Diskus 100-50 Mcg/dose Aepb (Fluticasone-Salmeterol) .... One Puff Twice Daily 5)  Spiriva Handihaler 18 Mcg Caps (Tiotropium Bromide Monohydrate) .... One Puff Once Daily 6)  Viagra 100 Mg Tabs (Sildenafil Citrate) .... One Tablet 30 Minutes Before Intercourse , Maximum Twice Weekly  Allergies (verified): No Known Drug Allergies  Past History:  Past medical, surgical, family and social  histories (including risk factors) reviewed, and no changes noted (except as noted below).  Past Medical History: NICOTINE ADDICTION (ICD-305.1) OBESITY (ICD-278.00) ERECTILE DYSFUNCTION (ICD-302.72) DYSLIPIDEMIA (ICD-272.4) HYPERTENSION (ICD-401.9) Hospitalised with uTI and elevated WBC in Sept 2011  Past Surgical History: Reviewed history from 08/11/2007 and no changes required. Bladder surgery for diveerticular disease in 02/2007.  Family History: Reviewed history from 07/22/2007 and no changes required. TWO CHILDREN MOTHER DECEASED FATHER DECEASED LUNG CANCER FROM ASBESTOS ONE SISTER DECEASED AT CHILDBIRTH  THREE SISTER SISTERS LIVING ONE BROTHER DECEASED TWO BROTHERS LIVING ONE SIBLING  HYPERTENSIVE  Social History: Reviewed history from 08/11/2007 and no changes required. Retired Single current smoker Alcohol use-no Drug use-no Current Smoker  Review of Systems      See HPI General:  Complains of fatigue and malaise. Eyes:  Denies discharge and eye pain. ENT:  Denies nasal congestion, postnasal drainage, and sinus pressure. CV:  Denies chest pain or discomfort, palpitations, and swelling of feet. Resp:  Denies cough, shortness of breath, and sputum productive; continues to smooke. GI:  Denies abdominal pain, constipation, diarrhea, nausea, and vomiting. GU:  Complains of urinary frequency and urinary hesitancy. MS:  Complains of joint pain, low back pain, and mid back pain. Derm:  Denies itching and rash. Neuro:  Denies headaches, seizures, and sensation of room spinning. Psych:  Complains of anxiety and depression; denies mental problems,  suicidal thoughts/plans, thoughts of violence, and unusual visions or sounds. Endo:  Denies excessive thirst and excessive urination. Heme:  Denies abnormal bruising and bleeding. Allergy:  Denies hives or rash and itching eyes.  Physical Exam  General:  Well-developed,well-nourished,in no acute distress; alert,appropriate  and cooperative throughout examination HEENT: No facial asymmetry,  EOMI, No sinus tenderness, TM's Clear, oropharynx  pink and moist.   Chest: Clear to auscultation bilaterally.  CVS: S1, S2, No murmurs, No S3.   Abd: Soft, Nontender.  MS: Adequate ROM spine, hips, shoulders and knees.  Ext: No edema.   CNS: CN 2-12 intact, power tone and sensation normal throughout.   Skin: Intact, no visible lesions or rashes.  Psych: Good eye contact, normal affect.  Memory intact, not anxious or depressed appearing.    Impression & Recommendations:  Problem # 1:  CYSTITIS, RECURRENT (ICD-595.9) Assessment Deteriorated recently hospitalised , plans trip  to tertiary ctr  Problem # 2:  COPD (ICD-496) Assessment: Unchanged  The following medications were removed from the medication list:    Proventil Hfa 108 (90 Base) Mcg/act Aers (Albuterol sulfate) .[name]... 2 puffs every 6 to 8 hours as needed His updated medication list for this problem includes:    Duoneb 0.5-2.5 (3) Mg/97ml Soln (Ipratropium-albuterol) ..... Use three times a day as needed    Advair Diskus 100-50 Mcg/dose Aepb (Fluticasone-salmeterol) ..... One puff twice daily    Spiriva Handihaler 18 Mcg Caps (Tiotropium bromide monohydrate) ..... One puff once daily  Problem # 3:  NICOTINE ADDICTION (ICD-305.1) Assessment: Unchanged  Encouraged smoking cessation and discussed different methods for smoking cessation.   Problem # 4:  HYPERTENSION (ICD-401.9) Assessment: Unchanged  The following medications were removed from the medication list:    Amlodipine Besylate 5 Mg Tabs (Amlodipine besylate) .[name]... Take 1 tablet by mouth once a day His updated medication list for this problem includes:    Maxzide-25 37.5-25 Mg Tabs (Triamterene-hctz) ..... One and half tablets daily  BP today: 146/90 Prior BP: 146/90 (10/03/2009) pt refusing additional med Labs Reviewed: K+: 4.0 (10/26/2008) Creat: : 1.35 (10/26/2008)   Chol: 165 (07/28/2008)    HDL: 35 (07/28/2008)   LDL: 105 (07/28/2008)   TG: 123 (07/28/2008)  Problem # 5:  DYSLIPIDEMIA (ICD-272.4) Assessment: Comment Only    HDL:35 (07/28/2008), 37 (09/12/2007)  LDL:105 (07/28/2008), 100 (09/12/2007)  Chol:165 (07/28/2008), 166 (09/12/2007)  Trig:123 (07/28/2008), 146 (09/12/2007) Low fat dietdiscussed and encouraged  Complete Medication List: 1)  Maxzide-25 37.5-25 Mg Tabs (Triamterene-hctz) .... One and half tablets daily 2)  Nebulizer Compressor Misc (Nebulizers) .... Uad 3)  Duoneb 0.5-2.5 (3) Mg/42ml Soln (Ipratropium-albuterol) .... Use three times a day as needed 4)  Advair Diskus 100-50 Mcg/dose Aepb (Fluticasone-salmeterol) .... One puff twice daily 5)  Spiriva Handihaler 18 Mcg Caps (Tiotropium bromide monohydrate) .... One puff once daily 6)  Viagra 100 Mg Tabs (Sildenafil citrate) .... One tablet 30 minutes before intercourse , maximum twice weekly  Other Orders: T-PSA ([id number])  Patient Instructions: 1)  Please schedule a follow-up appointment in 3 months. 2)  PSA prior to visit, ICD-9:  today/ asap copy tio Dr Baldo Ash urologist in [place].This is past due , last qwas in 08/20`1 and was nprmal. 3)  I support 100% your decision to establish with local urologist as well as to seek another surgical opinion regarding mx of your bladder diverticulum regarding risks and benefitsas far as surgical options are concerned. 4)  BP today is 146/90

## 2010-04-26 ENCOUNTER — Encounter: Payer: Self-pay | Admitting: Family Medicine

## 2010-04-27 NOTE — Letter (Signed)
Summary: BLOOD PRESSURE CUFF  BLOOD PRESSURE CUFF   Imported By: Lind Guest 04/03/2010 13:09:17  _____________________________________________________________________  External Attachment:    Type:   Image     Comment:   External Document

## 2010-04-27 NOTE — Letter (Signed)
Summary: POST OP VISIT  POST OP VISIT   Imported By: Lind Guest 04/18/2010 11:19:30  _____________________________________________________________________  External Attachment:    Type:   Image     Comment:   External Document

## 2010-04-27 NOTE — Assessment & Plan Note (Signed)
Summary: office visit   Vital Signs:  Patient profile:   70 year old male Height:      77 inches Weight:      292.50 pounds BMI:     34.81 O2 Sat:      93 % Pulse rate:   80 / minute Pulse rhythm:   regular Resp:     16 per minute BP sitting:   150 / 104  (left arm) Cuff size:   large  Vitals Entered By: Everitt Amber LPN (April 03, 2010 8:10 AM)  Nutrition Counseling: Patient's BMI is greater than 25 and therefore counseled on weight management options. CC: Follow up chronic problems   CC:  Follow up chronic problems.  History of Present Illness: Reports  that he has been doing fairly well. He has surgery for a bladder  diverticulum which has been a source of poor health and challenge for at least 4 yrs this Thursday. He is very hopeful about the outcome. He had cardiac eval in the past 4 mths, negative, he is in today concerned his BP is high. He has been a challenge as far as compliance is concerned , he deciedes that now he neither wants a stroke, nor does he want his syrgery postponed. Denies recent fever or chills. Denies sinus pressure, nasal congestion , ear pain or sore throat. Denies chest congestion, or cough productive of sputum. Denies chest pain, palpitations, PND, orthopnea or leg swelling. Denies abdominal pain, nausea, vomitting, diarrhea or constipation. Denies change in bowel movements or bloody stool.  Denies  joint pain, swelling, or reduced mobility. Denies headaches, vertigo, seizures. Denies depression, anxiety or insomnia. Denies  rash, lesions, or itch.     Preventive Screening-Counseling & Management  Alcohol-Tobacco     Smoking Cessation Counseling: yes  Current Medications (verified): 1)  Nebulizer Compressor  Misc (Nebulizers) .... Uad 2)  Duoneb 0.5-2.5 (3) Mg/33ml Soln (Ipratropium-Albuterol) .... Use Three Times A Day As Needed 3)  Spiriva Handihaler 18 Mcg Caps (Tiotropium Bromide Monohydrate) .... One Puff Once Daily 4)  Viagra 100  Mg Tabs (Sildenafil Citrate) .... One Tablet 30 Minutes Before Intercourse , Maximum Twice Weekly 5)  Lactulose  Soln (Lactulose) .[name].. 15 To 30 Cc Evry 3 To 4 Days As Needed 6)  Toprol Xl 25 Mg Xr24h-Tab (Metoprolol Succinate) .... Take 1 Tablet By Mouth Once A Day 7)  Ceftin 500 Mg Tabs (Cefuroxime Axetil) .... Take 1 Tablet By Mouth Two Times A Day 8)  Macrodantin 100 Mg Caps (Nitrofurantoin Macrocrystal) .... One Tab Every Evening After Supper 9)  Oxybutynin Chloride 5 Mg Tabs (Oxybutynin Chloride) .... Take 1 Tablet By Mouth Two Times A Day 10)  Isosorbide Mononitrate Cr 60 Mg Xr24h-Tab (Isosorbide Mononitrate) .... One Half Tab Once Daily 11)  Hydrocodone-Acetaminophen 7.5-325 Mg Tabs (Hydrocodone-Acetaminophen) .... One Tab Every 6 Hrs As Needed  Allergies (verified): No Known Drug Allergies  Review of Systems      See HPI Eyes:  Denies discharge and double vision. Endo:  Denies cold intolerance, excessive hunger, excessive urination, and weight change. Heme:  Denies abnormal bruising and bleeding. Allergy:  Denies hives or rash and itching eyes.  Physical Exam  General:  Well-developedobese,in no acute distress; alert,appropriate and cooperative throughout examination HEENT: No facial asymmetry,  EOMI, No sinus tenderness, TM's Clear, oropharynx  pink and moist.   Chest: Clear to auscultation bilaterally.  CVS: S1, S2, No murmurs, No S3.   Abd: Soft, Nontender.  MS: Adequate ROM spine,  hips, shoulders and knees.  Ext: No edema.   CNS: CN 2-12 intact, power tone and sensation normal throughout.   Skin: Intact, no visible lesions or rashes.  Psych: Good eye contact, normal affect.  Memory intact, not anxious or depressed appearing.    Impression & Recommendations:  Problem # 1:  CYSTITIS, RECURRENT (ICD-595.9) Assessment Comment Only  His updated medication list for this problem includes:    Ceftin 500 Mg Tabs (Cefuroxime axetil) .[name]... Take 1 tablet by mouth two times a  day    Macrodantin 100 Mg Caps (Nitrofurantoin macrocrystal) ..... One tab every evening after supper    Oxybutynin Chloride 5 Mg Tabs (Oxybutynin chloride) .[name]... Take 1 tablet by mouth two times a day upcoming surgery to correct this in 3 days at [facility]  Problem # 2:  DIVERTICULUM, BLADDER (ICD-596.3) Assessment: Comment Only elective surgical correction in 3 days  Problem # 3:  NICOTINE ADDICTION (ICD-305.1) Assessment: Improved  His updated medication list for this problem includes:    Nicoderm Cq 21 Mg/24hr Pt24 (Nicotine) .[name]... Apply one patch once daily for 2 weeks    Nicoderm Cq 14 Mg/24hr Pt24 (Nicotine) .[name]... Apply one patch once daily for 2 weeks, start 04/17/2009    Nicoderm Cq 7 Mg/24hr Pt24 (Nicotine) .[name]... Apply one patch daily for 2 weeks , start 05/01/2009  Orders: Medicare Electronic Prescription [phone])  Encouraged smoking cessation and discussed different methods for smoking cessation.   Problem # 4:  HYPERTENSION (ICD-401.9) Assessment: Deteriorated  The following medications were removed from the medication list:    Maxzide-25 37.5-25 Mg Tabs (Triamterene-hctz) ..... One and half tablets daily His updated medication list for this problem includes:    Toprol Xl 25 Mg Xr24h-tab (Metoprolol succinate) .[name]... Take 1 tablet by mouth once a day    Maxzide-25 37.5-25 Mg Tabs (Triamterene-hctz) .[name]... Take 1 tablet by mouth once a day  Orders: Medicare Electronic Prescription [phone])  BP today: 150/104 Prior BP: 146/90 (12/28/2009)  Labs Reviewed: K+: 4.0 (10/26/2008) Creat: : 1.35 (10/26/2008)   Chol: 165 (07/28/2008)   HDL: 35 (07/28/2008)   LDL: 105 (07/28/2008)   TG: 123 (07/28/2008)  Complete Medication List: 1)  Nebulizer Compressor Misc (Nebulizers) .... Uad 2)  Duoneb 0.5-2.5 (3) Mg/44ml Soln (Ipratropium-albuterol) .... Use three times a day as needed 3)  Spiriva Handihaler 18 Mcg Caps (Tiotropium bromide monohydrate) .... One puff once daily 4)   Viagra 100 Mg Tabs (Sildenafil citrate) .... One tablet 30 minutes before intercourse , maximum twice weekly 5)  Lactulose Soln (Lactulose) .[name].. 15 to 30 cc evry 3 to 4 days as needed 6)  Toprol Xl 25 Mg Xr24h-tab (Metoprolol succinate) .... Take 1 tablet by mouth once a day 7)  Ceftin 500 Mg Tabs (Cefuroxime axetil) .... Take 1 tablet by mouth two times a day 8)  Macrodantin 100 Mg Caps (Nitrofurantoin macrocrystal) .... One tab every evening after supper 9)  Oxybutynin Chloride 5 Mg Tabs (Oxybutynin chloride) .... Take 1 tablet by mouth two times a day 10)  Isosorbide Mononitrate Cr 60 Mg Xr24h-tab (Isosorbide mononitrate) .... One half tab once daily 11)  Hydrocodone-acetaminophen 7.5-325 Mg Tabs (Hydrocodone-acetaminophen) .... One tab every 6 hrs as needed 12)  Maxzide-25 37.5-25 Mg Tabs (Triamterene-hctz) .... Take 1 tablet by mouth once a day 13)  Prednisone (pak) 5 Mg Tabs (Prednisone) .... Use as directed 14)  Nicoderm Cq 21 Mg/24hr Pt24 (Nicotine) .... Apply one patch once daily for 2 weeks 15)  Nicoderm Cq 14  Mg/24hr Pt24 (Nicotine) .... Apply one patch once daily for 2 weeks, start 04/17/2009 16)  Nicoderm Cq 7 Mg/24hr Pt24 (Nicotine) .... Apply one patch daily for 2 weeks , start 05/01/2009  Patient Instructions: 1)  Please schedule a follow-up appointment in 3 to 3.5  months. 2)  Tobacco is very bad for your health and your loved ones! You Should stop smoking!. 3)  Stop Smoking Tips: Choose a Quit date. Cut down before the Quit date. decide what you will do as a substitute when you feel the urge to smoke(gum,toothpick,exercise). 4)  It is important that you exercise regularly at least 20 minutes 5 times a week. If you develop chest pain, have severe difficulty breathing, or feel very tired , stop exercising immediately and seek medical attention. 5)  You need to lose weight. Consider a lower calorie diet and regular exercise.  6)  Your BP is too high 150/104 , pls start maxzide 25mg    one dAILY IN ADDITION TO THE MEDS YOU ARE ALREADY TAKING 7)  All the best with your upcoming surgery. Prescriptions: LACTULOSE  SOLN (LACTULOSE) 15 to 30 cc evry 3 to 4 days as needed  #26mnth x 3   Entered by:   Adella Hare LPN   Authorized by:   Syliva Overman MD   Signed by:   Adella Hare LPN on 16/12/9602   Method used:   Electronically to        [facility] (retail)       [address]       [place], [name]  [address]       Ph: [phone]       Fax: [phone]   RxID:   [id number] NICODERM CQ 7 MG/24HR PT24 (NICOTINE) apply one patch daily for 2 weeks , start 05/01/2009  #14 x 0   Entered and Authorized by:   Syliva Overman MD   Signed by:   Syliva Overman MD on 04/03/2010   Method used:   Electronically to        [facility] (retail)       [address]       [place], [name]  [address]       Ph: [phone]       Fax: [phone]   RxID:   [id number] NICODERM CQ 14 MG/24HR PT24 (NICOTINE) apply one patch once daily for 2 weeks, start 04/17/2009  #14 x 0   Entered and Authorized by:   Syliva Overman MD   Signed by:   Syliva Overman MD on 04/03/2010   Method used:   Electronically to        [facility] (retail)       [address]       [place], [name]  [address]       Ph: [phone]       Fax: [phone]   RxID:   [id number] NICODERM CQ 21 MG/24HR PT24 (NICOTINE) apply one patch once daily for 2 weeks  #14 x 0   Entered and Authorized by:   Syliva Overman MD   Signed by:   Syliva Overman MD on 04/03/2010   Method used:   Electronically to        [facility] (retail)       [address]       [place], [name]  [address]       Ph: [phone]       Fax: [phone]   RxID:   [id number] PREDNISONE (PAK) 5 MG TABS (PREDNISONE) Use as directed  #21 x 0   Entered and Authorized by:   Syliva Overman MD   Signed by:    Syliva Overman MD on 04/03/2010   Method used:   Electronically to        [facility] (retail)       [address]       [place], [name]  [address]       Ph: [phone]       Fax: [phone]   RxID:   [id number] MAXZIDE-25 37.5-25 MG TABS (TRIAMTERENE-HCTZ) Take 1 tablet by mouth once a day  #30 x 3   Entered and Authorized by:   Syliva Overman MD   Signed by:   Syliva Overman MD on 04/03/2010   Method used:   Electronically to        [facility] (retail)       [address]       [place], [name]  [address]       Ph: [phone]       Fax: [phone]   RxID:   [id number]    Orders Added: 1)  Est. Patient Level IV [[address]] 2)  Medicare Electronic Prescription [phone]

## 2010-04-27 NOTE — Progress Notes (Signed)
Summary: medicine  Phone Note Call from Patient   Summary of Call: please resend his lactulose to [facility] again they said they did not recieve it Initial call taken by: Lind Guest,  March 06, 2010 10:48 AM    New/Updated Medications: LACTULOSE  SOLN (LACTULOSE) 15 to 30 cc evry 3 to 4 days as needed Prescriptions: LACTULOSE  SOLN (LACTULOSE) 15 to 30 cc evry 3 to 4 days as needed  #75mnth x 1   Entered by:   Adella Hare LPN   Authorized by:   Syliva Overman MD   Signed by:   Adella Hare LPN on 16/12/9602   Method used:   Electronically to        [facility] (retail)       [address]       [place], [name]  [address]       Ph: [phone]       Fax: [phone]   RxID:   [id number]

## 2010-05-01 ENCOUNTER — Encounter: Payer: Self-pay | Admitting: Family Medicine

## 2010-05-03 NOTE — Letter (Signed)
Summary: post- op visit  post- op visit   Imported By: Lind Guest 04/28/2010 15:51:23  _____________________________________________________________________  External Attachment:    Type:   Image     Comment:   External Document

## 2010-05-03 NOTE — Letter (Signed)
Summary: sono follow up  sono follow up   Imported By: Lind Guest 04/24/2010 11:35:38  _____________________________________________________________________  External Attachment:    Type:   Image     Comment:   External Document

## 2010-05-11 NOTE — Letter (Signed)
Summary: post op  post op   Imported By: Lind Guest 05/01/2010 14:11:08  _____________________________________________________________________  External Attachment:    Type:   Image     Comment:   External Document

## 2010-05-17 ENCOUNTER — Encounter: Payer: Self-pay | Admitting: Family Medicine

## 2010-05-23 NOTE — Letter (Signed)
Summary: urology  urology   Imported By: Lind Guest 05/19/2010 14:28:36  _____________________________________________________________________  External Attachment:    Type:   Image     Comment:   External Document

## 2010-05-24 ENCOUNTER — Encounter: Payer: Self-pay | Admitting: Family Medicine

## 2010-06-01 ENCOUNTER — Encounter: Payer: Self-pay | Admitting: Family Medicine

## 2010-06-01 NOTE — Letter (Signed)
Summary: stent removable post op  stent removable post op   Imported By: Lind Guest 05/26/2010 11:28:29  _____________________________________________________________________  External Attachment:    Type:   Image     Comment:   External Document

## 2010-06-06 NOTE — Letter (Signed)
Summary: Historic Patient File  Historic Patient File   Imported By: Rudene Anda 06/01/2010 10:03:29  _____________________________________________________________________  External Attachment:    Type:   Image     Comment:   External Document

## 2010-06-08 ENCOUNTER — Encounter: Payer: Self-pay | Admitting: Family Medicine

## 2010-06-13 NOTE — Letter (Signed)
Summary: PVR FOLLOW UP  PVR FOLLOW UP   Imported By: Lind Guest 06/08/2010 10:33:34  _____________________________________________________________________  External Attachment:    Type:   Image     Comment:   External Document

## 2010-07-06 ENCOUNTER — Other Ambulatory Visit: Payer: Self-pay | Admitting: Family Medicine

## 2010-07-31 ENCOUNTER — Encounter: Payer: Self-pay | Admitting: Family Medicine

## 2010-08-03 ENCOUNTER — Encounter: Payer: Self-pay | Admitting: Family Medicine

## 2010-08-03 ENCOUNTER — Ambulatory Visit: Payer: Self-pay | Admitting: Family Medicine

## 2010-08-08 NOTE — Assessment & Plan Note (Signed)
[facility]                       [place] CARDIOLOGY OFFICE NOTE   ADEMIDE, SCHABERG                    MRN:          [id number]  DATE:03/15/2008                            DOB:          06/01/1940    REFERRING PHYSICIAN:  Milus Mallick. Lodema Hong, MD at [facility].   I was asked by Dr. Syliva Overman to consult on Jonathan [name]. Jonathan [name]  with abnormal EKG and provide preoperative clearance for cystoscopy and  perhaps diverticular surgery of the bladder in the future.   HISTORY OF PRESENT ILLNESS:  Jonathan [name] is a 70 year old black male  who comes in today for the above issues.   He follows with Dr. Lodema [name] on a regular basis.  I have several of her  electronic medical notes all of which have been reviewed.  Recently, she  has been trying to control his blood pressure and actually has increased  his amlodipine to 10 mg per day.  This was based on a blood pressure of  160/90.   He currently has no cardiac complaints.  He specifically has no chest  discomfort or angina.  He has had no orthopnea, PND, or peripheral  edema.  He denies any tachy palpitations, syncope, or presyncope.  He  does not even have dyspnea on exertion.   His EKG showed poor progression across the anterior precordium.  We  repeated and confirmed this today.   PAST MEDICAL HISTORY:  He has no known drug allergies.   He has a history of hypertension.  He had bladder surgery in 2007 at the  [place].  He is currently under litigation about his concern about  postoperative care.  He has had a colonoscopy.   CURRENT MEDICATIONS:  1. Tekturna 300 mg a day.  2. Amlodipine 10 mg per day.  3. Cranberry pill 1 daily.   SOCIAL HISTORY:  He currently smokes 3 packs of cigarettes per day.  He  does not drink alcohol.  He uses no illicit drugs.   He is retired.  He enjoys playing golf.  He is widowed.  He has 2  children.  He was in the [employer] prior to retiring.   FAMILY HISTORY:  Negative for any premature coronary artery disease or  cardiac condition per se.   REVIEW OF SYSTEMS:  He wears reading glasses.  He has bottom dentures.  He has a subcatheter in place from his genitourinary standpoint.  His  other review of systems were questioned and are negative.   His last chest x-ray was October 11, 2007, which showed bronchitis,  possible small pulmonary nodule on the right.  CT scan was done which  was negative for lung nodule.   PHYSICAL EXAMINATION:  VITAL SIGNS:  He is 6 feet 7, weighs 279 pounds.  His blood pressure is 160/90 in the right arm, but he took neither blood  pressure medicine this morning!  Pulse is 85 and regular.  EKG is normal  sinus rhythm with poor progression across the anterior precordium.  HEENT:  Sclerae are muddy.  Facial symmetry is normal, bottom dentures.  NECK:  Supple.  There  is no cervical lymphadenopathy.  Carotid upstrokes  were equal bilaterally without bruits.  No JVD.  Thyroid is not  enlarged.  Trachea is midline.  LUNGS:  For decreased breath sounds throughout.  No rhonchi or wheezes.  HEART:  Poorly appreciated PMI.  Normal S1 and S2.  No gallop, rub, or  murmur.  ABDOMEN:  Soft, good bowel sounds.  No midline bruit.  No hepatomegaly.  EXTREMITIES:  No cyanosis, clubbing, or significant edema.  Pulses are  present.  NEURO:  Intact.   ASSESSMENT:  Abnormal EKG, rule out previous silent anterior myocardial  infarction.  I suspect this is unlikely without any significant history  of such and no other abnormalities on his EKG.  I suspect most of this  is clockwise rotation of the heart from COPD and hyperinflation of his  lungs.  He is also a very large man and this could reflect difficulties  in voltage transmission as well.   PLAN:  A 2-D echocardiogram.  If this is normal, we will see him back on  a p.r.n. basis.  He is also cleared for surgery at that point.     Thomas C. Daleen Squibb, MD, [facility]   Electronically Signed    TCW/MedQ  DD: 03/15/2008  DT: 03/16/2008  Job #: [id number]   cc:   Jonathan [name], M.D.

## 2010-08-08 NOTE — Op Note (Signed)
NAME:  Jonathan [name], Jonathan [name]             ACCOUNT NO.:  [id number]   MEDICAL RECORD NO.:  [id number]          PATIENT TYPE:  AMB   LOCATION:  [facility]                         FACILITY:  [facility]   PHYSICIAN:  Lindaann Slough, M.D.  DATE OF BIRTH:  May 09, 1940   DATE OF PROCEDURE:  02/24/2007  DATE OF DISCHARGE:                               OPERATIVE REPORT   PREOPERATIVE DIAGNOSIS:  Bladder stone, neurogenic bladder and bladder  diverticulum.   POSTOPERATIVE DIAGNOSIS:  Bladder stone, neurogenic bladder and bladder  diverticulum.   PROCEDURE:  Cystoscopy, EHL bladder stone.   SURGEON:  Dr. Brunilda Payor.   ANESTHESIA:  General.   INDICATIONS:  The patient is a 70 years old male who had suprapubic  cystolithotomy at the [facility] in November 2007.  He is known to  have a large bladder diverticulum and chronic urinary tract infection.  CT scan of the abdomen and pelvis showed a 1 cm bladder stone, large  bladder diverticulum.  He has been on in-and-out catheterization to  empty his bladder.  I discussed treatment options with the patient.  I  have told him that in view of the size of the diverticulum, that I do  not feel that a diverticulectomy is indicated at this time.  Prostate  ultrasound shows a prostate volume of 39 mL.  I discussed the treatment  options with him.  I had told that we could remove the bladder stone  with EHL.  Told him that he has a high risk of recurrence in view of the  fact that he is doing in-and-out catheterization and the presence of the  diverticulum.  And he wanted to have it out.  He is scheduled today for  the procedure.   The patient gave his informed consent.  He understands all the risks of  the procedure.   Under general anesthesia the patient was prepped and draped and placed  in the dorsal lithotomy position.  A #22 panendoscope was inserted in  the bladder.  The anterior urethra is normal.  He has a moderate  prostatic hypertrophy.  There is a large stone  in the bladder.  There is  no tumor in the bladder.  There is a diverticulum with ostium of  diverticulum in the posterior wall of the bladder above  the ureteral  orifices.  There is no stone or tumor in the diverticulum.  And the  ureteral orifices are in normal position and shape with clear efflux.  Then with the electrohydraulic probe, the stone was fragmented and stone  fragments were removed with the [facility] basket.  The smaller stone  fragments were then irrigated out of the bladder.  There was no evidence  of remaining stone fragments at the end of the procedure.  The bladder  was then emptied and the cystoscope removed.   The patient tolerated the procedure well and left the OR in satisfactory  condition to post anesthesia care unit.     Lindaann Slough, M.D.  Electronically Signed    MN/MEDQ  D:  02/24/2007  T:  02/24/2007  Job:  [id number]

## 2010-08-10 ENCOUNTER — Emergency Department (HOSPITAL_COMMUNITY)
Admission: EM | Admit: 2010-08-10 | Discharge: 2010-08-10 | Disposition: A | Discharge status: Home / Self Care | Payer: Medicare Other | Attending: Emergency Medicine | Admitting: Emergency Medicine

## 2010-08-10 DIAGNOSIS — I10 Essential (primary) hypertension: Secondary | ICD-10-CM | POA: Insufficient documentation

## 2010-08-10 DIAGNOSIS — R609 Edema, unspecified: Secondary | ICD-10-CM | POA: Insufficient documentation

## 2010-08-10 DIAGNOSIS — N289 Disorder of kidney and ureter, unspecified: Secondary | ICD-10-CM | POA: Insufficient documentation

## 2010-08-10 DIAGNOSIS — M7989 Other specified soft tissue disorders: Secondary | ICD-10-CM | POA: Insufficient documentation

## 2010-08-10 DIAGNOSIS — Z79899 Other long term (current) drug therapy: Secondary | ICD-10-CM | POA: Insufficient documentation

## 2010-08-10 LAB — POCT I-STAT, CHEM 8
BUN: 21 mg/dL (ref 6–23)
Calcium, Ion: 1.16 mmol/L (ref 1.12–1.32)
Chloride: 100 mEq/L (ref 96–112)
Creatinine, Ser: 1.7 mg/dL — ABNORMAL HIGH (ref 0.4–1.5)
Glucose, Bld: 105 mg/dL — ABNORMAL HIGH (ref 70–99)
HCT: 46 % (ref 39.0–52.0)
Hemoglobin: 15.6 g/dL (ref 13.0–17.0)
Potassium: 3.8 mEq/L (ref 3.5–5.1)
Sodium: 139 mEq/L (ref 135–145)
TCO2: 29 mmol/L (ref 0–100)

## 2010-08-10 LAB — URINALYSIS, ROUTINE W REFLEX MICROSCOPIC
Bilirubin Urine: NEGATIVE
Glucose, UA: NEGATIVE mg/dL
Hgb urine dipstick: NEGATIVE
Ketones, ur: NEGATIVE mg/dL
Nitrite: NEGATIVE
Protein, ur: NEGATIVE mg/dL
Specific Gravity, Urine: 1.024 (ref 1.005–1.030)
Urobilinogen, UA: 0.2 mg/dL (ref 0.0–1.0)
pH: 5.5 (ref 5.0–8.0)

## 2010-08-11 LAB — URINE CULTURE
Colony Count: >=100000
Culture  Setup Time: 201205170457

## 2010-08-11 NOTE — H&P (Signed)
NAME:  AMAL, SAIKI                       ACCOUNT NO.:  [id number]   MEDICAL RECORD NO.:  [id number]                   PATIENT TYPE:  AMB   LOCATION:  DAY                                  FACILITY:  [facility]   PHYSICIAN:  Jerolyn Shin C. Katrinka Blazing, M.D.                DATE OF BIRTH:  06-Mar-1941   DATE OF ADMISSION:  DATE OF DISCHARGE:                                HISTORY & PHYSICAL   A 70 year old male referred for screening colonoscopy.  He has been  relatively symptomatic.  His bowel movements have been regular.  He has not  had rectal bleeding.  There is no family history of colon cancer.   PAST MEDICAL HISTORY:  He has hypertension. He has never been hospitalized.   MEDICATIONS:  His only medication is Benicar 20 mg daily.   FAMILY HISTORY:  Positive for lung cancer.   SOCIAL HISTORY:  He is retired Investment banker, operational.  He is married,  college education.   REVIEW OF SYSTEMS:  Negative.   PHYSICAL EXAMINATION:  VITAL SIGNS:  Blood pressure 140/100, pulse 88,  respirations 18, weight 276 pounds.  HEENT:  Unremarkable.  NECK:  Supple.  No JVD or bruits.  CHEST:  Clear to auscultation.  HEART:  Regular rate and rhythm without murmur, gallop, or rub.  ABDOMEN:  Soft, nontender, without masses.  RECTAL:  Normal.  No masses.  Stool guaiac negative.  EXTREMITIES:  No cyanosis, clubbing, or edema.  NEUROLOGIC:  No focal motor, sensory, or cerebellar deficits.   IMPRESSION:  1. Need for screening colonoscopy.  2. Hypertension.   PLAN:  Screening colonoscopy.     ___________________________________________                                         Dirk Dress. Katrinka Blazing, M.D.   LCS/MEDQ  D:  07/07/2003  T:  07/07/2003  Job:  [id number]

## 2010-08-11 NOTE — Procedures (Signed)
NAME:  Jonathan [name], COY NO.:  [id number]   MEDICAL RECORD NO.:  [id number]          PATIENT TYPE:  OUT   LOCATION:  RAD                           FACILITY:  [facility]   PHYSICIAN:  Vida Roller, M.D.   DATE OF BIRTH:  30-Dec-1940   DATE OF PROCEDURE:  DATE OF DISCHARGE:                                  ECHOCARDIOGRAM   TAPE NUMBER:  [id number]   TAPE COUNT:  [id number]   HISTORY OF PRESENT ILLNESS:  This is a 70 year old man with hypertension and  lower extremity edema.  Technical quality of this study is adequate.   M-MODE TRACINGS:  Aorta is 42 mm.   Left atrium 48 mm.   Septum 19 mm.   Posterior wall 15 mm.   Left ventricular diastolic dimension 37 mm.   Left ventricular systolic dimension 22 mm.   A 2D AND DOPPLER IMAGING:  The left ventricle is normal size with vigorous  systolic function, estimated ejection fraction 65-70%.  There are no obvious  wall motion abnormalities.  There is moderate concentric left ventricular  hypertrophy with some predominance in the upper septum.  There is no  evidence of LV outflow obstruction versus systolic anterior motion of the  mitral leaflet.   The right ventricle is normal size with normal systolic function.   Both atria are enlarged.   The aortic valve is mildly sclerotic with no stenosis or regurgitation.   Mitral valve has annular calcification, but no significant stenosis or  regurgitation.   There is trace tricuspid regurgitation.   Pulmonic valve not well seen.   No pericardial effusion.   Inferior vena cava not well seen.   The aortic root appears to be slightly dilated, certainly within or right at  the limits of normal.   RECOMMENDATIONS:  Consideration for further imaging of the aorta including  CT scan.      Vida Roller, M.D.  Electronically Signed     JH/MEDQ  D:  12/11/2004  T:  12/11/2004  Job:  [id number]

## 2010-11-08 ENCOUNTER — Emergency Department (HOSPITAL_COMMUNITY)
Admission: EM | Admit: 2010-11-08 | Discharge: 2010-11-08 | Disposition: A | Discharge status: Home / Self Care | Payer: Medicare Other | Attending: Emergency Medicine | Admitting: Emergency Medicine

## 2010-11-08 ENCOUNTER — Emergency Department (HOSPITAL_COMMUNITY): Payer: Medicare Other

## 2010-11-08 DIAGNOSIS — I10 Essential (primary) hypertension: Secondary | ICD-10-CM | POA: Insufficient documentation

## 2010-11-08 DIAGNOSIS — S43016A Anterior dislocation of unspecified humerus, initial encounter: Secondary | ICD-10-CM | POA: Insufficient documentation

## 2010-11-08 DIAGNOSIS — R209 Unspecified disturbances of skin sensation: Secondary | ICD-10-CM | POA: Insufficient documentation

## 2010-11-08 DIAGNOSIS — W010XXA Fall on same level from slipping, tripping and stumbling without subsequent striking against object, initial encounter: Secondary | ICD-10-CM | POA: Insufficient documentation

## 2010-11-08 DIAGNOSIS — S46909A Unspecified injury of unspecified muscle, fascia and tendon at shoulder and upper arm level, unspecified arm, initial encounter: Secondary | ICD-10-CM | POA: Insufficient documentation

## 2010-11-08 DIAGNOSIS — Y92838 Other recreation area as the place of occurrence of the external cause: Secondary | ICD-10-CM | POA: Insufficient documentation

## 2010-11-08 DIAGNOSIS — S4980XA Other specified injuries of shoulder and upper arm, unspecified arm, initial encounter: Secondary | ICD-10-CM | POA: Insufficient documentation

## 2010-11-08 DIAGNOSIS — Y9239 Other specified sports and athletic area as the place of occurrence of the external cause: Secondary | ICD-10-CM | POA: Insufficient documentation

## 2010-11-08 DIAGNOSIS — Y9353 Activity, golf: Secondary | ICD-10-CM | POA: Insufficient documentation

## 2010-11-08 DIAGNOSIS — M25519 Pain in unspecified shoulder: Secondary | ICD-10-CM | POA: Insufficient documentation

## 2010-11-08 DIAGNOSIS — S4490XA Injury of unspecified nerve at shoulder and upper arm level, unspecified arm, initial encounter: Secondary | ICD-10-CM | POA: Insufficient documentation

## 2010-11-08 DIAGNOSIS — Z79899 Other long term (current) drug therapy: Secondary | ICD-10-CM | POA: Insufficient documentation

## 2010-11-10 ENCOUNTER — Emergency Department (HOSPITAL_COMMUNITY): Payer: Medicare Other

## 2010-11-10 ENCOUNTER — Other Ambulatory Visit (HOSPITAL_COMMUNITY): Payer: Medicare Other

## 2010-11-10 ENCOUNTER — Other Ambulatory Visit (HOSPITAL_COMMUNITY): Payer: Self-pay | Admitting: Emergency Medicine

## 2010-11-10 ENCOUNTER — Emergency Department (HOSPITAL_COMMUNITY)
Admission: EM | Admit: 2010-11-10 | Discharge: 2010-11-10 | Discharge status: Against Medical Advice | Payer: Medicare Other | Attending: Emergency Medicine | Admitting: Emergency Medicine

## 2010-11-10 ENCOUNTER — Encounter (HOSPITAL_COMMUNITY): Payer: Self-pay | Admitting: *Deleted

## 2010-11-10 ENCOUNTER — Ambulatory Visit (HOSPITAL_COMMUNITY)
Admission: RE | Admit: 2010-11-10 | Discharge: 2010-11-10 | Disposition: A | Discharge status: Home / Self Care | Payer: Medicare Other | Source: Ambulatory Visit | Attending: Emergency Medicine | Admitting: Emergency Medicine

## 2010-11-10 DIAGNOSIS — S4991XA Unspecified injury of right shoulder and upper arm, initial encounter: Secondary | ICD-10-CM

## 2010-11-10 DIAGNOSIS — W19XXXA Unspecified fall, initial encounter: Secondary | ICD-10-CM | POA: Insufficient documentation

## 2010-11-10 DIAGNOSIS — F17200 Nicotine dependence, unspecified, uncomplicated: Secondary | ICD-10-CM | POA: Insufficient documentation

## 2010-11-10 DIAGNOSIS — I10 Essential (primary) hypertension: Secondary | ICD-10-CM | POA: Insufficient documentation

## 2010-11-10 DIAGNOSIS — S143XXA Injury of brachial plexus, initial encounter: Secondary | ICD-10-CM | POA: Insufficient documentation

## 2010-11-10 NOTE — ED Notes (Signed)
Dr. Adriana Simas spoke with pt to inform him that his shoulder was dislocated; Dr. Adriana Simas spoke with Dr. Magnus Ivan, the orthopedic doctor on call at [facility] who said pt could have the arm reduced here in the ED since no ligaments were involved; I informed pt that Dr. Adriana Simas could see pt if he registered; pt stated he just wanted a copy of his MRI report so he could take it with him if he chose to go somewhere else; pt was encouraged to register and be seen, but pt stated he had waited long enough and did not wish to stay; pt told if his condition gets any worse to come in and be seen.

## 2010-11-10 NOTE — ED Notes (Signed)
Fell on Wednesday causing dislocation of rt shoulder, swelling and numbness of rt hand.  Seen at [facility] ER

## 2010-11-10 NOTE — ED Provider Notes (Addendum)
History     CSN: [id number] Arrival date & time: 11/10/2010  2:56 AM  Chief Complaint  Patient presents with  . Hand Problem   HPI Comments: Pt is right handed. States he is unable to use his fingers and has no feeling in his fingers since he fell two days ago. States he was hoping it would get better, has mild movement in his RIF but otherwise not improving.   Patient is a 70 y.o. male presenting with hand injury. The history is provided by the patient.  Hand Injury  The incident occurred 2 days ago. Incident location: Pt was playing golf and fell. He was seen at [facility] ED and had a right shoulder dislocation that was reduced. Pt states he has had numbness and weakness in his right hand since he fell.  The injury mechanism was a fall. The pain is present in the right wrist and right hand. Quality: numbness. The pain has been constant since the incident.    Past Medical History  Diagnosis Date  . Nicotine addiction   . Obesity   . Dyslipidemia   . Hypertension   . Elevated WBCs September 2011    and UTI Hospitalized   . Erectile dysfunction   . Arthritis     Past Surgical History  Procedure Date  . Bladder surgery 02/2007    For Diveeticular Disease    Family History  Problem Relation Age of Onset  . Cancer Father     History  Substance Use Topics  . Smoking status: Current Everyday Smoker  . Smokeless tobacco: Not on file  . Alcohol Use: No      Review of Systems  All other systems reviewed and are negative.    Physical Exam  BP 129/87  Pulse 91  Temp(Src) 98.4 F (36.9 C) (Oral)  Resp 18  Wt 278 lb (126.1 kg)  SpO2 98%  Physical Exam  Constitutional: He appears well-developed and well-nourished.  HENT:  Head: Normocephalic and atraumatic.  Eyes: Conjunctivae and EOM are normal. Pupils are equal, round, and reactive to light.  Pulmonary/Chest: Effort normal.  Musculoskeletal:       Pt is wearing a shoulder immobilizer on the right. His shoulder feels  like it is in joint. He appears to have a deformity in his right wrist with diffuse swelling of his right wrist, hand and fingers. He has minimal movement of his right thumb and RIF. He has numbness of the whole right hand.He is unable to flex his elbow or dorsiflex his wrist. He has no radial, medial or ulnar nerve function.  He has intact axillary nerve. He has good pulses and good warmth.   Neurological: He is alert.  Skin: Skin is warm, dry and intact.  Psychiatric: He has a normal mood and affect. His speech is normal and behavior is normal. Judgment normal.    ED Course  Procedures  MDM Results for orders placed during the hospital encounter of 08/10/10  URINALYSIS, ROUTINE W REFLEX MICROSCOPIC      Component Value Range   Color, Urine YELLOW  YELLOW    Appearance CLEAR  CLEAR    Specific Gravity, Urine 1.024  1.005 - 1.030    pH 5.5  5.0 - 8.0    Glucose, UA NEGATIVE  NEGATIVE (mg/dL)   Hgb urine dipstick NEGATIVE  NEGATIVE    Bilirubin Urine NEGATIVE  NEGATIVE    Ketones, ur NEGATIVE  NEGATIVE (mg/dL)   Protein, ur NEGATIVE  NEGATIVE (mg/dL)  Urobilinogen, UA 0.2  0.0 - 1.0 (mg/dL)   Nitrite NEGATIVE  NEGATIVE    Leukocytes, UA    NEGATIVE    Value: NEGATIVE MICROSCOPIC NOT DONE ON URINES WITH NEGATIVE PROTEIN, BLOOD, LEUKOCYTES, NITRITE, OR GLUCOSE <1000 mg/dL.  POCT I-STAT, CHEM 8      Component Value Range   Sodium 139  135 - 145 (mEq/L)   Potassium 3.8  3.5 - 5.1 (mEq/L)   Chloride 100  96 - 112 (mEq/L)   BUN 21  6 - 23 (mg/dL)   Creatinine, Ser 1.61 (*) 0.4 - 1.5 (mg/dL)   Glucose, Bld 096 (*) 70 - 99 (mg/dL)   Calcium, Ion 0.45  4.09 - 1.32 (mmol/L)   TCO2 29  0 - 100 (mmol/L)   Hemoglobin 15.6  13.0 - 17.0 (g/dL)   HCT 81.1  91.4 - 78.2 (%)  URINE CULTURE      Component Value Range   Specimen Description URINE, RANDOM     Special Requests NONE     Setup Time [id number]     Colony Count >=100,000 COLONIES/ML     Culture       Value: Multiple bacterial  morphotypes present, none predominant. Suggest appropriate recollection if clinically indicated.   Report Status 08/11/2010 FINAL     Dg Hand Complete Right  11/10/2010  *RADIOLOGY REPORT*  Clinical Data: Pain, numbness, and swelling after fall 2 days ago.  RIGHT HAND - COMPLETE 3+ VIEW  Comparison: None.  Findings: Marked productive degenerative changes around the first metacarpal phalangeal joint.  Mild degenerative changes of the second metacarpal phalangeal joint and multiple interphalangeal joints.  Degenerative changes in the carpus.  There appears to be some soft tissue swelling over the dorsum of the wrist with small bone fragments demonstrated which appear well corticated, likely representing old ununited ossicles.  No definite evidence of any acute fracture or subluxation.  No focal bone erosion.  IMPRESSION: Degenerative changes in the right hand.  No acute bony abnormalities demonstrated.  Original Report Authenticated By: Marlon Pel, M.D.    Right wrist reviewed by me (canopy problems) Has a lot of degenerative changes, no acute changes.     Have discussed xray results with patient and that he has a brachial plexus injury. He has a friend with him and wants to take him home and come back to the ED later today to get a MRI of his shoulder (offered to let them sleep in the ED until they arrive at 6:30 but he wanted to take his friend home).       Ward Givens, MD 11/10/10 [phone]  5:16 AM Pt has decided he will wait and have his MRI this morning.  Devoria Albe, MD, FACEP  Ward Givens, MD 11/10/10 [phone]   7:16 AM Pt has now decided he needs to leave and will come back later. I have talked to Kathlene November in MRI and he is going to work him in at 2:30 and knows we need a brachial plexus study.   Ward Givens, MD 11/10/10 [phone]

## 2010-11-10 NOTE — ED Notes (Signed)
Pt upset that he has had to wait so long. Pt stated he was told he would have his MRI around 0630. Dr. Lynelle Doctor in and clarified/updated pt. Advised may be around 730 or 8am before MRI can be done. Pt states his ride can not wait that long. Pt has appt for his MRI at 230pm today, pt aware and states he can get a ride back. P in nad. R arm in sling-states prior nurse gave him an xlarge sling and it feels better. NAD at this time.

## 2010-11-11 ENCOUNTER — Emergency Department (HOSPITAL_COMMUNITY)
Admission: EM | Admit: 2010-11-11 | Discharge: 2010-11-11 | Disposition: A | Discharge status: Home / Self Care | Payer: Medicare Other | Attending: Emergency Medicine | Admitting: Emergency Medicine

## 2010-11-11 DIAGNOSIS — R29898 Other symptoms and signs involving the musculoskeletal system: Secondary | ICD-10-CM | POA: Insufficient documentation

## 2010-11-11 DIAGNOSIS — M25519 Pain in unspecified shoulder: Secondary | ICD-10-CM | POA: Insufficient documentation

## 2010-11-11 DIAGNOSIS — R209 Unspecified disturbances of skin sensation: Secondary | ICD-10-CM | POA: Insufficient documentation

## 2010-11-11 DIAGNOSIS — I10 Essential (primary) hypertension: Secondary | ICD-10-CM | POA: Insufficient documentation

## 2010-11-11 DIAGNOSIS — R296 Repeated falls: Secondary | ICD-10-CM | POA: Insufficient documentation

## 2010-11-11 DIAGNOSIS — M25539 Pain in unspecified wrist: Secondary | ICD-10-CM | POA: Insufficient documentation

## 2010-11-11 DIAGNOSIS — M79609 Pain in unspecified limb: Secondary | ICD-10-CM | POA: Insufficient documentation

## 2010-12-22 LAB — POCT I-STAT, CHEM 8
BUN: 14
Calcium, Ion: 1.06 — ABNORMAL LOW
Chloride: 104
Creatinine, Ser: 1.6 — ABNORMAL HIGH
Glucose, Bld: 105 — ABNORMAL HIGH
HCT: 45
Hemoglobin: 15.3
Potassium: 3.8
Sodium: 137
TCO2: 26

## 2010-12-22 LAB — URINALYSIS, ROUTINE W REFLEX MICROSCOPIC
Bilirubin Urine: NEGATIVE
Glucose, UA: NEGATIVE
Hgb urine dipstick: NEGATIVE
Ketones, ur: NEGATIVE
Nitrite: POSITIVE — AB
Protein, ur: 30 — AB
Specific Gravity, Urine: 1.02
Urobilinogen, UA: 0.2
pH: 7

## 2010-12-22 LAB — URINE CULTURE: Colony Count: 8000

## 2010-12-22 LAB — URINE MICROSCOPIC-ADD ON

## 2010-12-29 ENCOUNTER — Emergency Department (HOSPITAL_COMMUNITY)
Admission: EM | Admit: 2010-12-29 | Discharge: 2010-12-29 | Disposition: A | Discharge status: Home / Self Care | Payer: Medicare Other | Attending: Emergency Medicine | Admitting: Emergency Medicine

## 2010-12-29 DIAGNOSIS — R209 Unspecified disturbances of skin sensation: Secondary | ICD-10-CM | POA: Insufficient documentation

## 2010-12-29 DIAGNOSIS — S143XXA Injury of brachial plexus, initial encounter: Secondary | ICD-10-CM | POA: Insufficient documentation

## 2010-12-29 DIAGNOSIS — M79609 Pain in unspecified limb: Secondary | ICD-10-CM | POA: Insufficient documentation

## 2010-12-29 DIAGNOSIS — Z7982 Long term (current) use of aspirin: Secondary | ICD-10-CM | POA: Insufficient documentation

## 2010-12-29 DIAGNOSIS — M7989 Other specified soft tissue disorders: Secondary | ICD-10-CM | POA: Insufficient documentation

## 2010-12-29 DIAGNOSIS — R296 Repeated falls: Secondary | ICD-10-CM | POA: Insufficient documentation

## 2010-12-29 DIAGNOSIS — Z79899 Other long term (current) drug therapy: Secondary | ICD-10-CM | POA: Insufficient documentation

## 2010-12-29 DIAGNOSIS — I10 Essential (primary) hypertension: Secondary | ICD-10-CM | POA: Insufficient documentation

## 2011-01-01 LAB — URINALYSIS, ROUTINE W REFLEX MICROSCOPIC
Bilirubin Urine: NEGATIVE
Glucose, UA: NEGATIVE
Nitrite: NEGATIVE
Protein, ur: 30 — AB
Specific Gravity, Urine: 1.025
Urobilinogen, UA: 1
pH: 6.5

## 2011-01-01 LAB — POCT I-STAT 4, (NA,K, GLUC, HGB,HCT)
Glucose, Bld: 91
HCT: 47
Hemoglobin: 16
Operator id: 114531
Potassium: 5.5 — ABNORMAL HIGH
Sodium: 138

## 2011-01-01 LAB — URINE MICROSCOPIC-ADD ON

## 2011-01-01 LAB — URINE CULTURE
Colony Count: 50000
Special Requests: NEGATIVE

## 2011-07-10 ENCOUNTER — Other Ambulatory Visit: Payer: Self-pay | Admitting: Family Medicine

## 2011-09-25 DIAGNOSIS — R300 Dysuria: Secondary | ICD-10-CM | POA: Insufficient documentation

## 2011-09-25 DIAGNOSIS — K219 Gastro-esophageal reflux disease without esophagitis: Secondary | ICD-10-CM | POA: Insufficient documentation

## 2011-11-08 DIAGNOSIS — R42 Dizziness and giddiness: Secondary | ICD-10-CM

## 2011-12-20 DIAGNOSIS — R079 Chest pain, unspecified: Secondary | ICD-10-CM

## 2012-09-25 DIAGNOSIS — G609 Hereditary and idiopathic neuropathy, unspecified: Secondary | ICD-10-CM | POA: Insufficient documentation

## 2012-09-29 ENCOUNTER — Other Ambulatory Visit: Payer: Self-pay | Admitting: Neurosurgery

## 2012-09-29 DIAGNOSIS — M47816 Spondylosis without myelopathy or radiculopathy, lumbar region: Secondary | ICD-10-CM

## 2012-10-07 ENCOUNTER — Ambulatory Visit
Admission: RE | Admit: 2012-10-07 | Discharge: 2012-10-07 | Disposition: A | Discharge status: Home / Self Care | Payer: Medicare Other | Source: Ambulatory Visit | Attending: Neurosurgery | Admitting: Neurosurgery

## 2012-10-07 DIAGNOSIS — M47816 Spondylosis without myelopathy or radiculopathy, lumbar region: Secondary | ICD-10-CM

## 2012-11-15 ENCOUNTER — Observation Stay (HOSPITAL_COMMUNITY): Payer: Medicare Other

## 2012-11-15 ENCOUNTER — Inpatient Hospital Stay (HOSPITAL_COMMUNITY)
Admission: EM | Admit: 2012-11-15 | Discharge: 2012-11-17 | DRG: 312 | Disposition: A | Discharge status: Home / Self Care | Payer: Medicare Other | Attending: Internal Medicine | Admitting: Internal Medicine

## 2012-11-15 ENCOUNTER — Encounter (HOSPITAL_COMMUNITY): Payer: Self-pay | Admitting: Emergency Medicine

## 2012-11-15 ENCOUNTER — Emergency Department (HOSPITAL_COMMUNITY): Payer: Medicare Other

## 2012-11-15 DIAGNOSIS — J449 Chronic obstructive pulmonary disease, unspecified: Secondary | ICD-10-CM | POA: Diagnosis present

## 2012-11-15 DIAGNOSIS — I10 Essential (primary) hypertension: Secondary | ICD-10-CM | POA: Diagnosis present

## 2012-11-15 DIAGNOSIS — M129 Arthropathy, unspecified: Secondary | ICD-10-CM | POA: Diagnosis present

## 2012-11-15 DIAGNOSIS — J4489 Other specified chronic obstructive pulmonary disease: Secondary | ICD-10-CM | POA: Diagnosis present

## 2012-11-15 DIAGNOSIS — F3289 Other specified depressive episodes: Secondary | ICD-10-CM | POA: Diagnosis present

## 2012-11-15 DIAGNOSIS — F528 Other sexual dysfunction not due to a substance or known physiological condition: Secondary | ICD-10-CM

## 2012-11-15 DIAGNOSIS — E872 Acidosis: Secondary | ICD-10-CM

## 2012-11-15 DIAGNOSIS — E785 Hyperlipidemia, unspecified: Secondary | ICD-10-CM | POA: Diagnosis present

## 2012-11-15 DIAGNOSIS — M109 Gout, unspecified: Secondary | ICD-10-CM

## 2012-11-15 DIAGNOSIS — R55 Syncope and collapse: Principal | ICD-10-CM | POA: Diagnosis present

## 2012-11-15 DIAGNOSIS — M48061 Spinal stenosis, lumbar region without neurogenic claudication: Secondary | ICD-10-CM | POA: Diagnosis present

## 2012-11-15 DIAGNOSIS — E860 Dehydration: Secondary | ICD-10-CM | POA: Diagnosis present

## 2012-11-15 DIAGNOSIS — F329 Major depressive disorder, single episode, unspecified: Secondary | ICD-10-CM | POA: Diagnosis present

## 2012-11-15 DIAGNOSIS — R079 Chest pain, unspecified: Secondary | ICD-10-CM

## 2012-11-15 DIAGNOSIS — Z87440 Personal history of urinary (tract) infections: Secondary | ICD-10-CM

## 2012-11-15 DIAGNOSIS — K5909 Other constipation: Secondary | ICD-10-CM | POA: Diagnosis present

## 2012-11-15 DIAGNOSIS — K5900 Constipation, unspecified: Secondary | ICD-10-CM | POA: Diagnosis present

## 2012-11-15 DIAGNOSIS — E669 Obesity, unspecified: Secondary | ICD-10-CM | POA: Diagnosis present

## 2012-11-15 DIAGNOSIS — F17200 Nicotine dependence, unspecified, uncomplicated: Secondary | ICD-10-CM | POA: Diagnosis present

## 2012-11-15 LAB — CBC
HCT: 42.4 % (ref 39.0–52.0)
Hemoglobin: 15 g/dL (ref 13.0–17.0)
MCH: 32.8 pg (ref 26.0–34.0)
MCHC: 35.4 g/dL (ref 30.0–36.0)
MCV: 92.6 fL (ref 78.0–100.0)
Platelets: 182 10*3/uL (ref 150–400)
RBC: 4.58 MIL/uL (ref 4.22–5.81)
RDW: 14.4 % (ref 11.5–15.5)
WBC: 9.2 10*3/uL (ref 4.0–10.5)

## 2012-11-15 LAB — URINALYSIS, ROUTINE W REFLEX MICROSCOPIC
Bilirubin Urine: NEGATIVE
Glucose, UA: NEGATIVE mg/dL
Hgb urine dipstick: NEGATIVE
Ketones, ur: NEGATIVE mg/dL
Leukocytes, UA: NEGATIVE
Nitrite: NEGATIVE
Protein, ur: 30 mg/dL — AB
Specific Gravity, Urine: 1.012 (ref 1.005–1.030)
Urobilinogen, UA: 0.2 mg/dL (ref 0.0–1.0)
pH: 7 (ref 5.0–8.0)

## 2012-11-15 LAB — BASIC METABOLIC PANEL
BUN: 13 mg/dL (ref 6–23)
CO2: 28 mEq/L (ref 19–32)
Calcium: 9 mg/dL (ref 8.4–10.5)
Chloride: 100 mEq/L (ref 96–112)
Creatinine, Ser: 1.43 mg/dL — ABNORMAL HIGH (ref 0.50–1.35)
GFR calc Af Amer: 56 mL/min — ABNORMAL LOW (ref >90)
GFR calc non Af Amer: 48 mL/min — ABNORMAL LOW (ref >90)
Glucose, Bld: 144 mg/dL — ABNORMAL HIGH (ref 70–99)
Potassium: 3.4 mEq/L — ABNORMAL LOW (ref 3.5–5.1)
Sodium: 139 mEq/L (ref 135–145)

## 2012-11-15 LAB — PRO B NATRIURETIC PEPTIDE: Pro B Natriuretic peptide (BNP): 22.6 pg/mL (ref 0–125)

## 2012-11-15 LAB — LACTIC ACID, PLASMA
Lactic Acid, Venous: 3 mmol/L — ABNORMAL HIGH (ref 0.5–2.2)
Lactic Acid, Venous: 1.8 mmol/L (ref 0.5–2.2)

## 2012-11-15 LAB — POCT I-STAT TROPONIN I: Troponin i, poc: 0.01 ng/mL (ref 0.00–0.08)

## 2012-11-15 LAB — TSH: TSH: 0.348 u[IU]/mL — ABNORMAL LOW (ref 0.350–4.500)

## 2012-11-15 LAB — TROPONIN I: Troponin I: <0.3 ng/mL (ref <0.30)

## 2012-11-15 LAB — CK: Total CK: 120 U/L (ref 7–232)

## 2012-11-15 LAB — URINE MICROSCOPIC-ADD ON

## 2012-11-15 MED ORDER — NICOTINE 14 MG/24HR TD PT24
14.0000 mg | MEDICATED_PATCH | TRANSDERMAL | Status: DC
  Administered 2012-11-15 – 2012-11-17 (×3): 14 mg via TRANSDERMAL
  Filled 2012-11-15 (×3): qty 1

## 2012-11-15 MED ORDER — MOMETASONE FURO-FORMOTEROL FUM 100-5 MCG/ACT IN AERO
2.0000 | INHALATION_SPRAY | Freq: Two times a day (BID) | RESPIRATORY_TRACT | Status: DC
  Filled 2012-11-15: qty 8.8

## 2012-11-15 MED ORDER — SODIUM CHLORIDE 0.9 % IV BOLUS (SEPSIS)
500.0000 mL | Freq: Once | INTRAVENOUS | Status: AC
  Administered 2012-11-15: 500 mL via INTRAVENOUS

## 2012-11-15 MED ORDER — SODIUM CHLORIDE 0.9 % IJ SOLN
3.0000 mL | INTRAMUSCULAR | Status: DC | PRN
  Administered 2012-11-15: 3 mL via INTRAVENOUS

## 2012-11-15 MED ORDER — ATENOLOL 50 MG PO TABS
50.0000 mg | ORAL_TABLET | ORAL | Status: DC
  Administered 2012-11-15 – 2012-11-16 (×2): 50 mg via ORAL
  Filled 2012-11-15 (×3): qty 1

## 2012-11-15 MED ORDER — CHLORTHALIDONE 25 MG PO TABS
25.0000 mg | ORAL_TABLET | ORAL | Status: DC
  Administered 2012-11-15 – 2012-11-16 (×2): 25 mg via ORAL
  Filled 2012-11-15 (×3): qty 1

## 2012-11-15 MED ORDER — SODIUM CHLORIDE 0.9 % IV SOLN
250.0000 mL | INTRAVENOUS | Status: DC | PRN
  Administered 2012-11-16: 1000 mL via INTRAVENOUS

## 2012-11-15 MED ORDER — ATENOLOL 50 MG PO TABS: 50.0000 mg | ORAL_TABLET | ORAL | Status: DC

## 2012-11-15 MED ORDER — ACETAMINOPHEN 325 MG PO TABS: 650.0000 mg | ORAL_TABLET | Freq: Four times a day (QID) | ORAL | Status: DC | PRN

## 2012-11-15 MED ORDER — SODIUM CHLORIDE 0.9 % IJ SOLN: 3.0000 mL | Freq: Two times a day (BID) | INTRAMUSCULAR | Status: DC

## 2012-11-15 MED ORDER — HEPARIN SODIUM (PORCINE) 5000 UNIT/ML IJ SOLN
5000.0000 [IU] | Freq: Three times a day (TID) | INTRAMUSCULAR | Status: DC
  Administered 2012-11-15 – 2012-11-17 (×5): 5000 [IU] via SUBCUTANEOUS
  Filled 2012-11-15 (×9): qty 1

## 2012-11-15 MED ORDER — LACTULOSE 10 GM/15ML PO SOLN
20.0000 g | Freq: Every day | ORAL | Status: DC | PRN
  Filled 2012-11-15: qty 30

## 2012-11-15 MED ORDER — ACETAMINOPHEN 650 MG RE SUPP: 650.0000 mg | Freq: Four times a day (QID) | RECTAL | Status: DC | PRN

## 2012-11-15 MED ORDER — IPRATROPIUM-ALBUTEROL 0.5-2.5 (3) MG/3ML IN SOLN: 3.0000 mL | Freq: Three times a day (TID) | RESPIRATORY_TRACT | Status: DC | PRN

## 2012-11-15 MED ORDER — ATENOLOL 50 MG PO TABS
50.0000 mg | ORAL_TABLET | Freq: Every day | ORAL | Status: DC
  Filled 2012-11-15: qty 1

## 2012-11-15 MED ORDER — IPRATROPIUM BROMIDE 0.02 % IN SOLN
0.5000 mg | Freq: Three times a day (TID) | RESPIRATORY_TRACT | Status: DC | PRN
  Administered 2012-11-17: 0.5 mg via RESPIRATORY_TRACT
  Filled 2012-11-15: qty 2.5

## 2012-11-15 MED ORDER — ALBUTEROL SULFATE (5 MG/ML) 0.5% IN NEBU
2.5000 mg | INHALATION_SOLUTION | Freq: Three times a day (TID) | RESPIRATORY_TRACT | Status: DC | PRN
  Administered 2012-11-17: 2.5 mg via RESPIRATORY_TRACT
  Filled 2012-11-15: qty 0.5

## 2012-11-15 MED ORDER — CHLORTHALIDONE 25 MG PO TABS: 25.0000 mg | ORAL_TABLET | ORAL | Status: DC

## 2012-11-15 MED ORDER — SODIUM CHLORIDE 0.9 % IV SOLN: INTRAVENOUS | Status: DC

## 2012-11-15 MED ORDER — CHLORTHALIDONE 25 MG PO TABS
25.0000 mg | ORAL_TABLET | Freq: Every day | ORAL | Status: DC
  Filled 2012-11-15: qty 1

## 2012-11-15 MED ORDER — ATENOLOL-CHLORTHALIDONE 50-25 MG PO TABS: 1.0000 | ORAL_TABLET | Freq: Every day | ORAL | Status: DC

## 2012-11-15 MED ORDER — SODIUM CHLORIDE 0.9 % IV BOLUS (SEPSIS)
1000.0000 mL | Freq: Once | INTRAVENOUS | Status: AC
  Administered 2012-11-15: 1000 mL via INTRAVENOUS

## 2012-11-15 MED ORDER — NAPROXEN 500 MG PO TABS
500.0000 mg | ORAL_TABLET | Freq: Every day | ORAL | Status: DC | PRN
  Filled 2012-11-15: qty 1

## 2012-11-15 NOTE — ED Notes (Addendum)
Patient alert and oriented and is being admitted to 3 west report to [employer]. Transferred to the floor via strecher. Patient transferred with monitors intact

## 2012-11-15 NOTE — ED Notes (Signed)
Pt sts took Atenolol last night-no bp meds since.

## 2012-11-15 NOTE — ED Notes (Signed)
Patient brought in via [facility]. EMS. Call came is that  patient was riding a lawn mower and was found on the ground clammy and hypotensive. Patient complains of a "gasey sensation in chest, Hx of GRED also complains left flank pain.

## 2012-11-15 NOTE — H&P (Signed)
Date: 11/15/2012               Patient Name:  Jonathan [name] MRN: [id number]  DOB: 07-29-1940 Age / Sex: 72 y.o., male   PCP: Kerri Perches, MD         Medical Service: Internal Medicine Teaching Service         Attending Physician: Dr. Farley Ly, MD    First Contact: Dr. Yetta Barre Pager: [phone]  Second Contact: Dr. Clyde Lundborg Pager: [phone]       After Hours (After 5p/  First Contact Pager: [phone]  weekends / holidays): Second Contact Pager: [phone]   Chief Complaint: Syncope  History of Present Illness:  [name] is a 72 y.o. male w/ PMHx of HTN, ?COPD, dyslipidemia, gout, and OA, presents to the ED after an episode of syncope this AM. He said he went outside to cut the grass before it started to rain and stepped off of his riding lawn mower when he started to feel weak in his legs until they gave out. He said he was dizzy and his surroundings were spinning when he went down to the ground. He denies hitting his head. He said he was on the ground feeling dizzy and thinks he lost his consciousness at this time. It is unclear from the patient how long he thought he was unconscious for, however the ED note says for ~3 hours. He denies any tongue biting, voiding of bowel or bladder, nausea, vomiting, or palpitations. The patient says he has been feeling weird in the last several months involving brief episodes of dizziness and tremulousness prior to eating a meal. He says he has checked his blood sugar during one of these moments and it has never been below 100. The patient is a relatively poor historian and the description of these similar "episodes" is very unclear. Otherwise, the patient denies any chest pain, SOB, cough, history of arrhythmias, history of stroke, change in vision, or slurring of speech. The patient was shown to have spinal stenosis noted on an MRI from 10/07/12 around the region of L4-L5. He takes several medications that can cause fluid depletion such as  Lasix, Chlorthalidone, and Lactulose, and also takes Flomax for his prostate. He also takes Viagra, but has not done so for a couple months.  On arrival to the ED, the patient was noted to have an elevated lactate to 3.0 and Cr of 1.42. Hypotension was not seen w/ orthostatics (139/77 to 144/103 from lying to standing), however, his pulse increased from 77 to 107 when changing from lying to standing.   Meds: Current Facility-Administered Medications  Medication Dose Route Frequency Provider Last Rate Last Dose  . 0.9 %  sodium chloride infusion  250 mL Intravenous PRN Lars Masson, MD      . 0.9 %  sodium chloride infusion   Intravenous Continuous Lars Masson, MD      . acetaminophen (TYLENOL) tablet 650 mg  650 mg Oral Q6H PRN Lars Masson, MD       Or  . acetaminophen (TYLENOL) suppository 650 mg  650 mg Rectal Q6H PRN Lars Masson, MD      . ipratropium (ATROVENT) nebulizer solution 0.5 mg  0.5 mg Nebulization TID PRN Drue Barbaraann Faster, RPH       And  . albuterol (PROVENTIL) (5 MG/ML) 0.5% nebulizer solution 2.5 mg  2.5 mg Nebulization TID PRN Drue Barbaraann Faster, RPH      . atenolol (TENORMIN) tablet 50 mg  50 mg Oral Daily Drue Theba, RPH      . chlorthalidone (HYGROTON) tablet 25 mg  25 mg Oral Daily Drue Alapaha, RPH      . heparin injection 5,000 Units  5,000 Units Subcutaneous Q8H Lars Masson, MD      . lactulose (CHRONULAC) 10 GM/15ML solution 20 g  20 g Oral Daily PRN Lars Masson, MD      . mometasone-formoterol (DULERA) 100-5 MCG/ACT inhaler 2 puff  2 puff Inhalation BID Lars Masson, MD      . naproxen (NAPROSYN) tablet 500 mg  500 mg Oral Daily PRN Lars Masson, MD      . nicotine (NICODERM CQ - dosed in mg/24 hours) patch 14 mg  14 mg Transdermal Q24H Lars Masson, MD      . sodium chloride 0.9 % injection 3 mL  3 mL Intravenous Q12H Lars Masson, MD      . sodium chloride 0.9 % injection 3 mL  3 mL Intravenous Q12H Lars Masson, MD      . sodium chloride 0.9 % injection 3 mL  3 mL Intravenous PRN  Lars Masson, MD        Allergies: Allergies as of 11/15/2012  . (No Known Allergies)   Past Medical History  Diagnosis Date  . Nicotine addiction   . Obesity   . Dyslipidemia   . Hypertension   . Elevated WBCs September 2011    and UTI Hospitalized   . Erectile dysfunction   . Arthritis    Past Surgical History  Procedure Laterality Date  . Bladder surgery  02/2007    For Diveeticular Disease   Family History  Problem Relation Age of Onset  . Cancer Father    History   Social History  . Marital Status: Widowed    Spouse Name: N/A    Number of Children: 2  . Years of Education: N/A   Occupational History  . retired    Social History Main Topics  . Smoking status: Current Every Day Smoker  . Smokeless tobacco: Not on file  . Alcohol Use: No  . Drug Use: No  . Sexual Activity: Not on file   Other Topics Concern  . Not on file   Social History Narrative  . No narrative on file   Review of Systems: General: Denies fever, chills, diaphoresis, appetite change and fatigue.  HEENT: Denies change in vision, eye pain, redness, hearing loss, congestion, sore throat, rhinorrhea, sneezing, mouth sores, trouble swallowing, neck pain, neck stiffness and tinnitus.   Respiratory: Denies SOB, DOE, cough, chest tightness, and wheezing.   Cardiovascular: Positive for infrequent leg swelling. Denies chest pain, palpitations.  Gastrointestinal: Denies nausea, vomiting, abdominal pain, diarrhea, constipation, blood in stool and abdominal distention.  Genitourinary: Denies dysuria, urgency, frequency, hematuria, flank pain and difficulty urinating.  Endocrine: Denies hot or cold intolerance, sweats, polyuria, polydipsia. Musculoskeletal: Denies myalgias, back pain, joint swelling, arthralgias and gait problem.  Skin: Denies pallor, rash and wounds.  Neurological: Positive for weakness, dizziness, lightheadedness, and syncope. Denies seizures, numbness and headaches.    Hematological: Denies adenopathy,easy bruising, personal or family bleeding history.  Psychiatric/Behavioral: Denies mood changes, confusion, nervousness, sleep disturbance and agitation.  Physical Exam: Filed Vitals:   11/15/12 1315 11/15/12 1330 11/15/12 1345 11/15/12 1400  BP: 152/79 140/78 143/84 146/83  Pulse: 80 92 75 94  Resp: 19 21 13 21   SpO2: 97% 97% 91% 94%   General: Vital signs reviewed.  Patient is a well-developed and  well-nourished, in no acute distress and cooperative with exam. Alert and oriented x3.  Head: Normocephalic and atraumatic. Nose: No erythema or drainage noted.  Turbinates normal. Mouth: No erythema, exudates, sores, or ulcerations. Moist mucus membranes. Eyes: PERRL, EOMI, conjunctivae normal, No scleral icterus.  Neck: Supple, trachea midline, normal ROM, No JVD, masses, thyromegaly, or carotid bruit present.  Cardiovascular: RRR, S1 normal, S2 normal, no murmurs, gallops, or rubs. Pulmonary/Chest: Normal respiratory effort, CTAB, no wheezes, rales, or rhonchi. Abdominal: Soft. Non-tender, non-distended, bowel sounds are normal, no masses, organomegaly, or guarding present.  Musculoskeletal: No joint deformities, erythema, or stiffness, ROM full and no nontender. Extremities: Trace pitting edema in LE's. Pulses symmetric and intact bilaterally. No cyanosis or clubbing. Neurological: A&O x3, Grip strength is 4/5 in RUE, but is a chronic issue. Cranial nerve II-XII are grossly intact, no focal motor deficit, sensory intact to light touch bilaterally.  Skin: Warm, dry and intact. No rashes or erythema. Psychiatric: Normal mood and affect. speech and behavior is normal. Cognition and memory are normal.   Lab results: Basic Metabolic Panel:  Recent Labs  78/46/96 1149  NA 139  K 3.4*  CL 100  CO2 28  GLUCOSE 144*  BUN 13  CREATININE 1.43*  CALCIUM 9.0   CBC:  Recent Labs  11/15/12 1149  WBC 9.2  HGB 15.0  HCT 42.4  MCV 92.6  PLT 182    Cardiac Enzymes:  Recent Labs  11/15/12 1549 11/15/12 1601  CKTOTAL  --  120  TROPONINI <0.30  --    BNP:  Recent Labs  11/15/12 1549  PROBNP 22.6   Urinalysis:  Recent Labs  11/15/12 1301  COLORURINE YELLOW  LABSPEC 1.012  PHURINE 7.0  GLUCOSEU NEGATIVE  HGBUR NEGATIVE  BILIRUBINUR NEGATIVE  KETONESUR NEGATIVE  PROTEINUR 30*  UROBILINOGEN 0.2  NITRITE NEGATIVE  LEUKOCYTESUR NEGATIVE   Imaging results:  Ct Head Wo Contrast  11/15/2012   *RADIOLOGY REPORT*  Clinical Data: Syncopal episode.  History of hypertension.  CT HEAD WITHOUT CONTRAST  Technique:  Contiguous axial images were obtained from the base of the skull through the vertex without contrast.  Comparison: Most recent CT head 11/08/2011.  Also prior MR head 07/28/2007.  Findings: There is no evidence for acute infarction, intracranial hemorrhage, mass lesion, hydrocephalus, or extra-axial fluid.  Mild atrophy.  Mild chronic microvascular ischemic change.  Calvarium intact.  No sinus or mastoid disease.  Dense lenticular opacities. Scalp and other extracranial soft tissues unremarkable.  Similar appearance to priors.  IMPRESSION: Mild atrophy and chronic microvascular ischemic change.  No acute intracranial findings.   Original Report Authenticated By: Davonna Belling, M.D.   Dg Chest Port 1 View  11/15/2012   *RADIOLOGY REPORT*  Clinical Data: Hypertension.  PORTABLE CHEST - 1 VIEW  Comparison: 12/20/2011  Findings: The cardiac silhouette is normal in size and configuration.  The mediastinum is normal in contour caliber. There are no hilar masses.  The lungs are mildly hyperexpanded.  The lungs are clear.  No pleural effusion or pneumothorax.  The bony thorax is demineralized but grossly intact.  IMPRESSION: No acute cardiopulmonary disease.  No change from the prior study.   Original Report Authenticated By: Amie Portland, M.D.    Other results: EKG: NSR  Assessment & Plan by Problem:  72 y.o. male w/ PMHx of  HTN, ?COPD, dyslipidemia, gout, and OA, presents to the ED after an episode of syncope this AM.  #Syncope- Patient reports presyncope w/ dizziness and weakness after  mowing the lawn, leading to loss of consciousness, the length of which is unclear. Most likely d/t low intravascular volume. Patient is on both lasix and Atenolol-Chlorthalidone, in addition to Lactulose for constipation and Flomax for BPH. All of these things most likely contributing to volume depletion. Patient also presented w/ Cr of 1.43, baseline unknown however. Given 1.5 L NS in the ED. Orthostatics performed in the ED were negative, however, pulse increased from 77-107 when transitioning from lying to standing. Other possible causes of syncope still must be ruled out. If patient does not improve with volume repletion and still complains of dizziness, consider vestibular PT for BPPV as well. -Admitted for observation to telemetry bed. -Started on NS at 75 ml/hr. -pBNP drawn, found to be 22.6. CHF unlikely at this time. -2D ECHO ordered to rule out possible structural/dynamic cardiac abnormalities.  -Troponin x1 -ve. EKG shows no dynamic changes suggestive of ischemia. -Hold Lasix at this time.  #Dyslipidemia- Previous lipids unknown -Fasting lipid panel ordered for the AM.  #HTN- BP stable at this time. Continue home atenolol-chlorthalidone. -Hold Lasix for now.  #COPD- Patient has been smoking .5 packs/day for >40years. Continued home regimen COPD medications. Albuterol/atrovent prn.  #Constipation- Continued Lactulose  #Gout- Apparently takes Prednisone for gout attacks, however he rarely takes this medication. Held for now.  #Nicotine abuse- Given Nicoderm patch 14 mg  DVT/PE PPx- Heparin  Dispo: Disposition is deferred at this time, awaiting improvement of current medical problems. Anticipated discharge in approximately 1-2 day(s).   The patient does have a current PCP Kerri Perches, MD) and does need an [facility]  hospital follow-up appointment after discharge.  The patient does not have transportation limitations that hinder transportation to clinic appointments.  Signed: Lars Masson, MD 11/15/2012, 5:32 PM  Pager: [phone]

## 2012-11-15 NOTE — ED Provider Notes (Signed)
CSN: [id number]     Arrival date & time 11/15/12  1046 History     First MD Initiated Contact with Patient 11/15/12 1102     Chief Complaint  Patient presents with  . Hypotension   (Consider location/radiation/quality/duration/timing/severity/associated sxs/prior Treatment) Patient is a 72 y.o. male presenting with syncope. The history is provided by the patient.  Loss of Consciousness Episode history:  Single Most recent episode:  Today Timing: once. Progression:  Worsening Chronicity:  New Context: dehydration and standing up   Context: not blood draw, not bowel movement, not exertion, not with normal activity, not sight of blood, not sitting down and not urination   Witnessed: no   Relieved by:  Nothing Worsened by:  Nothing tried Ineffective treatments:  None tried Associated symptoms: diaphoresis, dizziness (woozy) and nausea   Associated symptoms: no chest pain, no confusion, no difficulty breathing, no shortness of breath and no vomiting     Past Medical History  Diagnosis Date  . Nicotine addiction   . Obesity   . Dyslipidemia   . Hypertension   . Elevated WBCs September 2011    and UTI Hospitalized   . Erectile dysfunction   . Arthritis    Past Surgical History  Procedure Laterality Date  . Bladder surgery  02/2007    For Diveeticular Disease   Family History  Problem Relation Age of Onset  . Cancer Father    History  Substance Use Topics  . Smoking status: Current Every Day Smoker  . Smokeless tobacco: Not on file  . Alcohol Use: No    Review of Systems  Constitutional: Positive for diaphoresis.  Respiratory: Negative for shortness of breath.   Cardiovascular: Positive for syncope. Negative for chest pain.  Gastrointestinal: Positive for nausea and abdominal pain (chronic). Negative for vomiting and anal bleeding.  Neurological: Positive for dizziness (woozy).  Psychiatric/Behavioral: Negative for confusion.  All other systems reviewed and are  negative.    Allergies  Review of patient's allergies indicates no known allergies.  Home Medications   Current Outpatient Rx  Name  Route  Sig  Dispense  Refill  . atenolol-chlorthalidone (TENORETIC) 50-25 MG per tablet   Oral   Take 1 tablet by mouth daily.         . Fluticasone-Salmeterol (ADVAIR DISKUS) 100-50 MCG/DOSE AEPB   Inhalation   Inhale 1 puff into the lungs as needed (shortness of breath).          . furosemide (LASIX) 40 MG tablet   Oral   Take 40 mg by mouth daily.           [name] ipratropium-albuterol (DUONEB) 0.5-2.5 (3) MG/3ML SOLN   Nebulization   Take 3 mLs by nebulization 3 (three) times daily as needed (wheezing).          [name] lactulose (CHRONULAC) 10 GM/15ML solution   Oral   Take 20 g by mouth daily as needed (constipation).         . naproxen (NAPROSYN) 500 MG tablet   Oral   Take 500 mg by mouth daily as needed (pain).         . Nebulizers (NEBULIZER COMPRESSOR) MISC   Does not apply   by Does not apply route. UAD          . predniSONE (DELTASONE) 10 MG tablet   Oral   Take 20 mg by mouth daily.          . sildenafil (VIAGRA) 100 MG tablet  Oral   Take 100 mg by mouth. One tablet 30 minutes before intercourse, maximum twice weekly          . tiotropium (SPIRIVA) 18 MCG inhalation capsule   Inhalation   Place 18 mcg into inhaler and inhale daily.            BP 137/81  Pulse 102  SpO2 96% Physical Exam  Constitutional: He is oriented to person, place, and time. He appears well-developed and well-nourished. No distress.  HENT:  Head: Normocephalic and atraumatic.  Mouth/Throat: No oropharyngeal exudate.  Eyes: EOM are normal. Pupils are equal, round, and reactive to light.  Neck: Normal range of motion. Neck supple.  Cardiovascular: Normal rate and regular rhythm.  Exam reveals no friction rub.   No murmur heard. Pulmonary/Chest: Effort normal and breath sounds normal. No respiratory distress. He has no wheezes. He  has no rales.  Abdominal: He exhibits no distension. There is no tenderness. There is no rebound.  Musculoskeletal: Normal range of motion. He exhibits no edema.  Neurological: He is alert and oriented to person, place, and time.  Skin: He is not diaphoretic.    ED Course   Procedures (including critical care time)  Labs Reviewed  CBC  BASIC METABOLIC PANEL  LACTIC ACID, PLASMA   Dg Chest Port 1 View  11/15/2012   *RADIOLOGY REPORT*  Clinical Data: Hypertension.  PORTABLE CHEST - 1 VIEW  Comparison: 12/20/2011  Findings: The cardiac silhouette is normal in size and configuration.  The mediastinum is normal in contour caliber. There are no hilar masses.  The lungs are mildly hyperexpanded.  The lungs are clear.  No pleural effusion or pneumothorax.  The bony thorax is demineralized but grossly intact.  IMPRESSION: No acute cardiopulmonary disease.  No change from the prior study.   Original Report Authenticated By: Amie Portland, M.D.   1. Lactic acidosis   2. Dehydration   3. Syncope      Date: 11/15/2012  Rate: 96  Rhythm: normal sinus rhythm  QRS Axis: normal  Intervals: normal  ST/T Wave abnormalities: Very mild ST depression inferiorly.  Conduction Disutrbances:none  Narrative Interpretation:   Old EKG Reviewed: mild ST depression inferiorly   MDM  72 year old male presents with syncope. 16 field a disorder with some nausea and old the dizziness. He is monos lawn, was very hot and sweaty, and when he stood up to get off he passed out. He denied any preceding shortness of breath or chest pain. He stated that happened around 7 AM.  she arrived to the  ED around 11 after he called 911. There is concern he was down for about 3 hours. He is well-appearing here. Vitals are stable and is not hypotensive. He is staying still nauseated and occasionally woozy, but is improving. Concern for dehydration due to his tongue being very cobblestoned and dry. We'll give fluids and check basic  labs. EKG is normal. Labs show lactic acidosis. With dehydration and elevated lactate, will admit for observation and fluids.  I have reviewed all labs and imaging and considered them in my medical decision making.   Dagmar Hait, MD 11/15/12 1332

## 2012-11-16 ENCOUNTER — Inpatient Hospital Stay (HOSPITAL_COMMUNITY): Payer: Medicare Other

## 2012-11-16 HISTORY — PX: TRANSTHORACIC ECHOCARDIOGRAM: SHX275

## 2012-11-16 LAB — CBC
HCT: 39.2 % (ref 39.0–52.0)
Hemoglobin: 13.5 g/dL (ref 13.0–17.0)
MCH: 32.1 pg (ref 26.0–34.0)
MCHC: 34.4 g/dL (ref 30.0–36.0)
MCV: 93.3 fL (ref 78.0–100.0)
Platelets: 182 10*3/uL (ref 150–400)
RBC: 4.2 MIL/uL — ABNORMAL LOW (ref 4.22–5.81)
RDW: 14.7 % (ref 11.5–15.5)
WBC: 11.8 10*3/uL — ABNORMAL HIGH (ref 4.0–10.5)

## 2012-11-16 LAB — COMPREHENSIVE METABOLIC PANEL
ALT: 14 U/L (ref 0–53)
AST: 14 U/L (ref 0–37)
Albumin: 3.1 g/dL — ABNORMAL LOW (ref 3.5–5.2)
Alkaline Phosphatase: 42 U/L (ref 39–117)
BUN: 13 mg/dL (ref 6–23)
CO2: 31 mEq/L (ref 19–32)
Calcium: 8.8 mg/dL (ref 8.4–10.5)
Chloride: 103 mEq/L (ref 96–112)
Creatinine, Ser: 1.25 mg/dL (ref 0.50–1.35)
GFR calc Af Amer: 66 mL/min — ABNORMAL LOW (ref >90)
GFR calc non Af Amer: 57 mL/min — ABNORMAL LOW (ref >90)
Glucose, Bld: 108 mg/dL — ABNORMAL HIGH (ref 70–99)
Potassium: 3.8 mEq/L (ref 3.5–5.1)
Sodium: 139 mEq/L (ref 135–145)
Total Bilirubin: 0.3 mg/dL (ref 0.3–1.2)
Total Protein: 6.6 g/dL (ref 6.0–8.3)

## 2012-11-16 LAB — LIPID PANEL
Cholesterol: 154 mg/dL (ref 0–200)
HDL: 36 mg/dL — ABNORMAL LOW (ref >39)
LDL Cholesterol: 93 mg/dL (ref 0–99)
Total CHOL/HDL Ratio: 4.3 RATIO
Triglycerides: 125 mg/dL (ref <150)
VLDL: 25 mg/dL (ref 0–40)

## 2012-11-16 LAB — HEMOGLOBIN A1C
Hgb A1c MFr Bld: 6 % — ABNORMAL HIGH (ref <5.7)
Mean Plasma Glucose: 126 mg/dL — ABNORMAL HIGH (ref <117)

## 2012-11-16 LAB — TROPONIN I: Troponin I: <0.3 ng/mL (ref <0.30)

## 2012-11-16 NOTE — Progress Notes (Signed)
  Echocardiogram 2D Echocardiogram has been performed.  Jonathan [name] FRANCES 11/16/2012, 5:01 PM

## 2012-11-16 NOTE — Progress Notes (Signed)
Utilization review completed.  

## 2012-11-16 NOTE — Evaluation (Signed)
Physical Therapy Evaluation Patient Details Name: Jonathan [name] MRN: [id number] DOB: November 06, 1940 Today's Date: 11/16/2012 Time: 2952-8413 PT Time Calculation (min): 11 min  PT Assessment / Plan / Recommendation History of Present Illness  Patient is a 72 year old man with history of hypertension, gout, dyslipidemia, arthritis, spinal stenosis, and other problems as outlined in the medical history admitted following an episode of loss of consciousness with fall.  Clinical Impression  Patient presents with general weakness and slight decrease in balance.  Patient reports no dizziness throughout session.  Will benefit from acute PT to maximize independence prior to discharge home.  Do not anticipate need for any f/u PT at discharge.    PT Assessment  Patient needs continued PT services    Follow Up Recommendations  No PT follow up;Supervision/Assistance - 24 hour    Does the patient have the potential to tolerate intense rehabilitation      Barriers to Discharge        Equipment Recommendations  None recommended by PT    Recommendations for Other Services     Frequency Min 3X/week    Precautions / Restrictions Precautions Precautions: Fall Restrictions Weight Bearing Restrictions: No   Pertinent Vitals/Pain       Mobility  Bed Mobility Bed Mobility: Supine to Sit;Sitting - Scoot to Edge of Bed Supine to Sit: 6: Modified independent (Device/Increase time);With rails Sitting - Scoot to Edge of Bed: 6: Modified independent (Device/Increase time);With rail Transfers Transfers: Sit to Stand;Stand to Sit Sit to Stand: 4: Min guard;With upper extremity assist;From bed Stand to Sit: 4: Min guard;With upper extremity assist;With armrests;To chair/3-in-1 Details for Transfer Assistance: Verbal cues for hand placement.  Assist for balance/safety. Ambulation/Gait Ambulation/Gait Assistance: 4: Min guard Ambulation Distance (Feet): 40 Feet Assistive device:  None Ambulation/Gait Assistance Details: Patient able to ambulate without assistive device with min guard assist.  Balance slightly unsteady.  Patient reports no dizziness with position changes or with gait. Gait Pattern: Step-through pattern;Decreased step length - right;Decreased step length - left;Trunk flexed Gait velocity: Slow gait speed General Gait Details: Patient reports he is "unsteady" in early mornings and when he hasn't eaten.    Exercises     PT Diagnosis: Difficulty walking;Abnormality of gait;Generalized weakness  PT Problem List: Decreased strength;Decreased activity tolerance;Decreased balance;Decreased mobility;Decreased knowledge of use of DME PT Treatment Interventions: DME instruction;Gait training;Functional mobility training;Balance training;Patient/family education     PT Goals(Current goals can be found in the care plan section) Acute Rehab PT Goals Patient Stated Goal: To go home soon PT Goal Formulation: With patient Time For Goal Achievement: 11/23/12 Potential to Achieve Goals: Good  Visit Information  Last PT Received On: 11/16/12 Assistance Needed: +1 History of Present Illness: Patient is a 72 year old man with history of hypertension, gout, dyslipidemia, arthritis, spinal stenosis, and other problems as outlined in the medical history admitted following an episode of loss of consciousness with fall.       Prior Functioning  Home Living Family/patient expects to be discharged to:: Private residence Living Arrangements: Spouse/significant other Available Help at Discharge: Family;Available 24 hours/day Type of Home: House Home Access: Ramped entrance Home Layout: One level Home Equipment: Walker - 2 wheels;Shower seat;Bedside commode;Electric scooter Prior Function Level of Independence: Independent with assistive device(s) Communication Communication: No difficulties    Cognition  Cognition Arousal/Alertness: Awake/alert Behavior During  Therapy: WFL for tasks assessed/performed Overall Cognitive Status: Within Functional Limits for tasks assessed    Extremity/Trunk Assessment Upper Extremity Assessment Upper Extremity Assessment: Overall [facility]  for tasks assessed Lower Extremity Assessment Lower Extremity Assessment: Generalized weakness (Reports occasional pain LLE from "back problem")   Balance Balance Balance Assessed: Yes Static Sitting Balance Static Sitting - Balance Support: No upper extremity supported;Feet supported Static Sitting - Level of Assistance: 5: Stand by assistance Static Sitting - Comment/# of Minutes: 4 Static Standing Balance Static Standing - Balance Support: No upper extremity supported Static Standing - Level of Assistance: 5: Stand by assistance Static Standing - Comment/# of Minutes: 2  End of Session PT - End of Session Equipment Utilized During Treatment: Gait belt Activity Tolerance: Patient limited by fatigue Patient left: in chair;with nursing/sitter in room (being transported to MRI) Nurse Communication: Mobility status  GP     Vena Austria 11/16/2012, 6:42 PM Durenda Hurt. Renaldo Fiddler, [facility] Acute Rehab Services Pager [phone]

## 2012-11-16 NOTE — Progress Notes (Signed)
Subjective: [name] is a 72 y.o. male w/ PMHx of HTN, ?COPD, dyslipidemia, gout, and OA, presents to the ED after an episode of syncope on the morning of 11/15/12.  Patient seen at bedside this AM. Says he is feeling much better today. Says he hasn't felt this good in a long time. Denies any dizziness, SOB, lightheadedness, chest pain, palpitations, nausea, vomiting, or LOC.   Objective: Vital signs in last 24 hours: Filed Vitals:   11/15/12 1345 11/15/12 1400 11/15/12 2057 11/16/12 0531  BP: 143/84 146/83 145/77 125/85  Pulse: 75 94 92 76  Temp:   97.9 F (36.6 C) 97.6 F (36.4 C)  Resp: 13 21 18 18   SpO2: 91% 94% 99% 100%   Weight change:   Intake/Output Summary (Last 24 hours) at 11/16/12 0947 Last data filed at 11/16/12 1610  Gross per 24 hour  Intake    720 ml  Output    500 ml  Net    220 ml   Physical Exam: General: Alert, cooperative, and in no apparent distress HEENT: Vision grossly intact, oropharynx clear and non-erythematous  Neck: Full range of motion without pain, supple, no lymphadenopathy or carotid bruits Lungs: Clear to ascultation bilaterally, normal work of respiration, no wheezes, rales, ronchi Heart: Regular rate and rhythm, no murmurs, gallops, or rubs Abdomen: Soft, non-tender, non-distended, normal bowel sounds Extremities: +1 pitting edema in LE's today, more significant than yesterday. Neurologic: Alert & oriented X3, cranial nerves II-XII intact, strength grossly intact, sensation intact to light touch  Lab Results: Basic Metabolic Panel:  Recent Labs Lab 11/15/12 1149 11/16/12 0435  NA 139 139  K 3.4* 3.8  CL 100 103  CO2 28 31  GLUCOSE 144* 108*  BUN 13 13  CREATININE 1.43* 1.25  CALCIUM 9.0 8.8   Liver Function Tests:  Recent Labs Lab 11/16/12 0435  AST 14  ALT 14  ALKPHOS 42  BILITOT 0.3  PROT 6.6  ALBUMIN 3.1*   CBC:  Recent Labs Lab 11/15/12 1149 11/16/12 0435  WBC 9.2 11.8*  HGB 15.0 13.5  HCT  42.4 39.2  MCV 92.6 93.3  PLT 182 182   Cardiac Enzymes:  Recent Labs Lab 11/15/12 1549 11/15/12 1601 11/16/12 0809  CKTOTAL  --  120  --   TROPONINI <0.30  --  <0.30   BNP:  Recent Labs Lab 11/15/12 1549  PROBNP 22.6   Hemoglobin A1C:  Recent Labs Lab 11/15/12 1601  HGBA1C 6.0*   Fasting Lipid Panel:  Recent Labs Lab 11/16/12 0435  CHOL 154  HDL 36*  LDLCALC 93  TRIG 960  CHOLHDL 4.3   Thyroid Function Tests:  Recent Labs Lab 11/15/12 1601  TSH 0.348*   Urinalysis:  Recent Labs Lab 11/15/12 1301  COLORURINE YELLOW  LABSPEC 1.012  PHURINE 7.0  GLUCOSEU NEGATIVE  HGBUR NEGATIVE  BILIRUBINUR NEGATIVE  KETONESUR NEGATIVE  PROTEINUR 30*  UROBILINOGEN 0.2  NITRITE NEGATIVE  LEUKOCYTESUR NEGATIVE   Studies/Results: Ct Head Wo Contrast  11/15/2012   *RADIOLOGY REPORT*  Clinical Data: Syncopal episode.  History of hypertension.  CT HEAD WITHOUT CONTRAST  Technique:  Contiguous axial images were obtained from the base of the skull through the vertex without contrast.  Comparison: Most recent CT head 11/08/2011.  Also prior MR head 07/28/2007.  Findings: There is no evidence for acute infarction, intracranial hemorrhage, mass lesion, hydrocephalus, or extra-axial fluid.  Mild atrophy.  Mild chronic microvascular ischemic change.  Calvarium intact.  No sinus  or mastoid disease.  Dense lenticular opacities. Scalp and other extracranial soft tissues unremarkable.  Similar appearance to priors.  IMPRESSION: Mild atrophy and chronic microvascular ischemic change.  No acute intracranial findings.   Original Report Authenticated By: Davonna Belling, M.D.   Dg Chest Port 1 View  11/15/2012   *RADIOLOGY REPORT*  Clinical Data: Hypertension.  PORTABLE CHEST - 1 VIEW  Comparison: 12/20/2011  Findings: The cardiac silhouette is normal in size and configuration.  The mediastinum is normal in contour caliber. There are no hilar masses.  The lungs are mildly hyperexpanded.  The  lungs are clear.  No pleural effusion or pneumothorax.  The bony thorax is demineralized but grossly intact.  IMPRESSION: No acute cardiopulmonary disease.  No change from the prior study.   Original Report Authenticated By: Amie Portland, M.D.   Medications: I have reviewed the patient's current medications. Scheduled Meds: . atenolol  50 mg Oral Q24H  . chlorthalidone  25 mg Oral Q24H  . heparin  5,000 Units Subcutaneous Q8H  . mometasone-formoterol  2 puff Inhalation BID  . nicotine  14 mg Transdermal Q24H  . sodium chloride  3 mL Intravenous Q12H  . sodium chloride  3 mL Intravenous Q12H   Continuous Infusions: . sodium chloride     PRN Meds:.sodium chloride, acetaminophen, acetaminophen, albuterol, ipratropium, lactulose, naproxen, sodium chloride  Assessment/Plan: [name] is a 72 y.o. male w/ PMHx of HTN, ?COPD, dyslipidemia, gout, and OA, presents to the ED after an episode of syncope on the morning of 11/15/12.  #Syncope- Patient was stepping off of his riding lawnmower on 11/15/12 and felt his legs become weak, and he slowly fell to the ground. He felt his surroundings start to spin and it is thought that at this time he lost consciousness. The exact details of the event are very unclear as the patient is a very poor historian. Denies any tongue biting, voiding of bowel or bladder, or palpitations. This event is most likely d/t intravascular volume deletion 2/2 medications. This differential diagnosis was further supported on more discussion w/ patient today. Said he had taken 2 doses of lasix the day previously and felt very thirsty the morning of his syncopal episode. He takes both lasix and chlorthalidone, in addition to Flomax for BPH and lactulose for constipation. On admission, Cr was 1.43, today it is 1.25. Patient did have lactate of 3.0 on admission, however on repeat it was normal at 1.8. Patient feels much better this AM after fluids given. -Discontinued fluids  today as he is eating and drinking well. -For MRI/MRA today to r/o any possible intracranial abnormalities accounting for his dizziness/syncope. -For ECHO today, rule out possible structural abnormalities or valvular disease. -pBNP 22, wnl.  -Repeat troponin  (x2) -ve. -CK not elevated at 120.  -Perform EEG today to r/o possible seizure activity, however at this time it is unlikely given the patient's history. -Lipid panel wnl -Patient describes LE weakness prior to episode of weakness and also has LUE grip strength weakness, however this was noted to be a chronic issue. PT/OT consulted today, recommendations pending.  #Mild Leukocytosis- No elevation of WBC's on admission, but patient presented w/ elevated lactate at 3.0, however on repeat, it was 1.8. THis morning, WBC's were 11.8, only slightly elevated, however an occult infection cannot be ruled out d/t elevated lactate initially. -Sent for BCx x 2 today. Pending  #Dyslipidemia- Previous lipids unknown. Fasting lipid panel performed today, shows total cholesterol of 154, triglycerides 125,  HDL 36, and LDL 93. No statin necessary at this time.  #HTN- BP stable at this time. Continue home atenolol-chlorthalidone.  -Still holding Lasix for now.   #COPD- Patient has been smoking .5 packs/day for >40years.  -Continued home regimen COPD medications. Albuterol/atrovent prn.   #Constipation- Continue Lactulose   #Gout- Apparently takes Prednisone for gout attacks, however he rarely takes this medication. Held for now.   #Nicotine abuse- Given Nicoderm patch 14 mg   DVT/PE PPx- Heparin  Dispo: Disposition is deferred at this time, awaiting improvement of current medical problems.  Anticipated discharge in approximately 1-2 day(s).   The patient does have a current PCP Kerri Perches, MD) and does need an [facility] hospital follow-up appointment after discharge.  The patient does not have transportation limitations that hinder  transportation to clinic appointments.  .Services Needed at time of discharge: Y = Yes, Blank = No PT:   OT:   RN:   Equipment:   Other:     LOS: 1 day   Lars Masson, MD 11/16/2012, 9:47 AM Pager: [phone]

## 2012-11-16 NOTE — H&P (Signed)
Internal Medicine Attending Admission Note Date: 11/16/2012  Patient name: Jonathan [name] Medical record number: [id number] Date of birth: 10/17/1940 Age: 72 y.o. Gender: male  I saw and evaluated the patient. I reviewed the resident's note and I agree with the resident's findings and plan as documented in the resident's note, with the following additional comments.  Chief Complaint(s): Episode of loss of consciousness  History - key components related to admission: Patient is a 72 year old man with history of hypertension, gout, dyslipidemia, arthritis, spinal stenosis, and other problems as outlined in the medical history admitted following an episode of loss of consciousness which occurred at home.  Patient reports that he had been mowing his grass, got off of his lawnmower, and when he stood up his legs became weak and then he passed out.  He thinks that he was out for only a few minutes, then regained consciousness and called EMS.  He denies any injury in the fall.  He reports chronic episodes of feeling somewhat dizzy whenever he is hungry; he also reports a fall associated with "vertigo" in the past in which he hurt his right shoulder with subsequent right upper extremity weakness.  He says that he has been worked up at other hospitals including [facility] for a number of problems including a bladder diverticula.  He takes occasional prednisone for gout and says he does not take this on a regular basis.  He had no chest pain with this event, although he does report a feeling of abdominal bloating after he eats.  Review of Care Everywhere shows a visit in May to the [facility] emergency department with complaint of generalized muscle weakness, with a note that his symptoms sounded like intermittent hypoglycemic spells; his glucose there was normal.  He was also been seen at [facility] in the past by a urologist for recurrent UTI.   Physical Exam - key components related to  admission:  Filed Vitals:   11/15/12 1345 11/15/12 1400 11/15/12 2057 11/16/12 0531  BP: 143/84 146/83 145/77 125/85  Pulse: 75 94 92 76  Temp:   97.9 F (36.6 C) 97.6 F (36.4 C)  Resp: 13 21 18 18   SpO2: 91% 94% 99% 100%   General: Alert, no distress  Neck: Supple; no bruits Lungs: Clear Heart: Regular; S1-S2, no S3, no S4, no murmurs Abdomen: Bowel sounds present, soft, nontender; no hepatosplenomegaly; no bruits Extremities: No edema Neurologic: Alert and oriented; cranial nerves II through XII intact; motor 5 over 5 throughout; nonfocal   Lab results:   Basic Metabolic Panel:  Recent Labs  44/01/02 1149 11/16/12 0435  NA 139 139  K 3.4* 3.8  CL 100 103  CO2 28 31  GLUCOSE 144* 108*  BUN 13 13  CREATININE 1.43* 1.25  CALCIUM 9.0 8.8    Liver Function Tests:  Recent Labs  11/16/12 0435  AST 14  ALT 14  ALKPHOS 42  BILITOT 0.3  PROT 6.6  ALBUMIN 3.1*     CBC:  Recent Labs  11/15/12 1149 11/16/12 0435  WBC 9.2 11.8*  HGB 15.0 13.5  HCT 42.4 39.2  MCV 92.6 93.3  PLT 182 182     Cardiac Enzymes:  Recent Labs  11/15/12 1549 11/15/12 1601 11/16/12 0809  CKTOTAL  --  120  --   TROPONINI <0.30  --  <0.30    BNP:  Recent Labs  11/15/12 1549  PROBNP 22.6      Hemoglobin A1C:  Recent Labs  11/15/12  1601  HGBA1C 6.0*   Fasting Lipid Panel:  Recent Labs  11/16/12 0435  CHOL 154  HDL 36*  LDLCALC 93  TRIG 829  CHOLHDL 4.3    Thyroid Function Tests:  Recent Labs  11/15/12 1601  TSH 0.348*      Urinalysis    Component Value Date/Time   COLORURINE YELLOW 11/15/2012 1301   APPEARANCEUR CLEAR 11/15/2012 1301   LABSPEC 1.012 11/15/2012 1301   PHURINE 7.0 11/15/2012 1301   GLUCOSEU NEGATIVE 11/15/2012 1301   HGBUR NEGATIVE 11/15/2012 1301   BILIRUBINUR NEGATIVE 11/15/2012 1301   KETONESUR NEGATIVE 11/15/2012 1301   PROTEINUR 30* 11/15/2012 1301   UROBILINOGEN 0.2 11/15/2012 1301   NITRITE NEGATIVE 11/15/2012 1301    LEUKOCYTESUR NEGATIVE 11/15/2012 1301    Urine microscopic:  Recent Labs  11/15/12 1301  OTHERU AMORPHOUS URATES/PHOSPHATES     Imaging results:  Ct Head Wo Contrast  11/15/2012   *RADIOLOGY REPORT*  Clinical Data: Syncopal episode.  History of hypertension.  CT HEAD WITHOUT CONTRAST  Technique:  Contiguous axial images were obtained from the base of the skull through the vertex without contrast.  Comparison: Most recent CT head 11/08/2011.  Also prior MR head 07/28/2007.  Findings: There is no evidence for acute infarction, intracranial hemorrhage, mass lesion, hydrocephalus, or extra-axial fluid.  Mild atrophy.  Mild chronic microvascular ischemic change.  Calvarium intact.  No sinus or mastoid disease.  Dense lenticular opacities. Scalp and other extracranial soft tissues unremarkable.  Similar appearance to priors.  IMPRESSION: Mild atrophy and chronic microvascular ischemic change.  No acute intracranial findings.   Original Report Authenticated By: Davonna Belling, M.D.   Dg Chest Port 1 View  11/15/2012   *RADIOLOGY REPORT*  Clinical Data: Hypertension.  PORTABLE CHEST - 1 VIEW  Comparison: 12/20/2011  Findings: The cardiac silhouette is normal in size and configuration.  The mediastinum is normal in contour caliber. There are no hilar masses.  The lungs are mildly hyperexpanded.  The lungs are clear.  No pleural effusion or pneumothorax.  The bony thorax is demineralized but grossly intact.  IMPRESSION: No acute cardiopulmonary disease.  No change from the prior study.   Original Report Authenticated By: Amie Portland, M.D.    Other results: EKG: Reportedly normal (not available in Epic)  Assessment & Plan by Problem:  1.  Episode of loss of consciousness.  Patient has no prior history of syncope or seizure; he does have a history of episodes of dizziness which he associates with being hungry, but no documented hypoglycemia.  He also reports a past episode of vertigo with a fall.  The  etiology of yesterday's episode is unclear; he does report premonitory postural symptoms, and it may be that he was volume depleted; he was apparently on both a thiazide diuretic and furosemide, and had been working outside in the heat.  His heart rate increased on orthostatics, which would support orthostatic hypotension as a cause of syncope.  Plan is monitor; 2-D echocardiogram; MRI/MRA of brain; EEG; mobilize out of bed with assistance; check a.m. cortisol; follow blood sugars.  2.  Volume depletion.  Plan is IV volume replacement; follow ins and outs; follow orthostatic vital signs; hold furosemide.  3.  Would request outside records from patient's primary care physician.  4.  Other problems and plans as per the resident physician's note.

## 2012-11-17 ENCOUNTER — Inpatient Hospital Stay (HOSPITAL_COMMUNITY): Payer: Medicare Other

## 2012-11-17 DIAGNOSIS — F528 Other sexual dysfunction not due to a substance or known physiological condition: Secondary | ICD-10-CM

## 2012-11-17 LAB — BASIC METABOLIC PANEL
BUN: 11 mg/dL (ref 6–23)
CO2: 29 mEq/L (ref 19–32)
Calcium: 8.8 mg/dL (ref 8.4–10.5)
Chloride: 100 mEq/L (ref 96–112)
Creatinine, Ser: 1.23 mg/dL (ref 0.50–1.35)
GFR calc Af Amer: 67 mL/min — ABNORMAL LOW (ref >90)
GFR calc non Af Amer: 58 mL/min — ABNORMAL LOW (ref >90)
Glucose, Bld: 93 mg/dL (ref 70–99)
Potassium: 3.4 mEq/L — ABNORMAL LOW (ref 3.5–5.1)
Sodium: 136 mEq/L (ref 135–145)

## 2012-11-17 LAB — CBC WITH DIFFERENTIAL/PLATELET
Basophils Absolute: 0 10*3/uL (ref 0.0–0.1)
Basophils Relative: 0 % (ref 0–1)
Eosinophils Absolute: 0.2 10*3/uL (ref 0.0–0.7)
Eosinophils Relative: 4 % (ref 0–5)
HCT: 39.1 % (ref 39.0–52.0)
Hemoglobin: 13.2 g/dL (ref 13.0–17.0)
Lymphocytes Relative: 39 % (ref 12–46)
Lymphs Abs: 2.6 10*3/uL (ref 0.7–4.0)
MCH: 31.7 pg (ref 26.0–34.0)
MCHC: 33.8 g/dL (ref 30.0–36.0)
MCV: 93.8 fL (ref 78.0–100.0)
Monocytes Absolute: 0.5 10*3/uL (ref 0.1–1.0)
Monocytes Relative: 7 % (ref 3–12)
Neutro Abs: 3.2 10*3/uL (ref 1.7–7.7)
Neutrophils Relative %: 50 % (ref 43–77)
Platelets: 162 10*3/uL (ref 150–400)
RBC: 4.17 MIL/uL — ABNORMAL LOW (ref 4.22–5.81)
RDW: 14.4 % (ref 11.5–15.5)
WBC: 6.5 10*3/uL (ref 4.0–10.5)

## 2012-11-17 LAB — T4, FREE: Free T4: 1.47 ng/dL (ref 0.80–1.80)

## 2012-11-17 LAB — CORTISOL: Cortisol, Plasma: 6.2 ug/dL

## 2012-11-17 LAB — VITAMIN B12: Vitamin B-12: 534 pg/mL (ref 211–911)

## 2012-11-17 MED ORDER — POTASSIUM CHLORIDE CRYS ER 20 MEQ PO TBCR
40.0000 meq | EXTENDED_RELEASE_TABLET | Freq: Once | ORAL | Status: AC
  Administered 2012-11-17: 40 meq via ORAL
  Filled 2012-11-17: qty 2

## 2012-11-17 MED ORDER — POLYETHYLENE GLYCOL 3350 17 G PO PACK: 17.0000 g | PACK | Freq: Every day | ORAL | Status: DC

## 2012-11-17 MED ORDER — FUROSEMIDE 40 MG PO TABS: 20.0000 mg | ORAL_TABLET | Freq: Every day | ORAL | Status: DC

## 2012-11-17 MED ORDER — COSYNTROPIN 0.25 MG IJ SOLR
0.2500 mg | Freq: Once | INTRAMUSCULAR | Status: AC
  Administered 2012-11-17: 0.25 mg via INTRAVENOUS
  Filled 2012-11-17: qty 0.25

## 2012-11-17 NOTE — Progress Notes (Signed)
EEG completed; results pending.    

## 2012-11-17 NOTE — Discharge Summary (Signed)
Name: Jonathan [name] MRN: [id number] DOB: [birth year] 72 y.o. PCP: Kerri Perches, MD  Date of Admission: 11/15/2012 10:46 AM Date of Discharge: 11/17/2012 Attending Physician: Farley Ly, MD  Discharge Diagnosis: 1. Syncope- Most likely d/t volume depletion. 2. HTN 3. COPD 4. Gout 5. Constipation  Discharge Medications:   Medication List    STOP taking these medications       lactulose 10 GM/15ML solution  Commonly known as:  CHRONULAC      TAKE these medications       ADVAIR DISKUS 100-50 MCG/DOSE Aepb  Generic drug:  Fluticasone-Salmeterol  Inhale 1 puff into the lungs as needed (shortness of breath).     atenolol-chlorthalidone 50-25 MG per tablet  Commonly known as:  TENORETIC  Take 1 tablet by mouth daily.     DUONEB 0.5-2.5 (3) MG/3ML Soln  Generic drug:  ipratropium-albuterol  Take 3 mLs by nebulization 3 (three) times daily as needed (wheezing).     furosemide 40 MG tablet  Commonly known as:  LASIX  Take 0.5 tablets (20 mg total) by mouth daily.     naproxen 500 MG tablet  Commonly known as:  NAPROSYN  Take 500 mg by mouth daily as needed (pain).     Nebulizer Compressor Misc  by Does not apply route. UAD     polyethylene glycol packet  Commonly known as:  MIRALAX  Take 17 g by mouth daily.     predniSONE 10 MG tablet  Commonly known as:  DELTASONE  Take 20 mg by mouth daily.     sildenafil 100 MG tablet  Commonly known as:  VIAGRA  Take 100 mg by mouth. One tablet 30 minutes before intercourse, maximum twice weekly     tiotropium 18 MCG inhalation capsule  Commonly known as:  SPIRIVA  Place 18 mcg into inhaler and inhale daily.        Disposition and follow-up:   [name] was discharged from [facility] in good condition.  At the hospital follow up visit please address:  1.  Assess if new medication regimen has provided him relief from syncope/pre-syncope.   2.  Labs / imaging needed at  time of follow-up: none  3.  Pending labs/ test needing follow-up: Cosyntropin Stimulation test; performed just prior to discharge. Review results w/ patient. Negative result; patient does have adequate ACTH/cortisol response.  Follow-up Appointments:     Follow-up Information   Follow up with Gust Rung, DO On 11/25/2012. (10:00 AM)    Specialty:  Internal Medicine   Contact information:   [address] [name] [address] [phone]       Discharge Instructions: Discharge Orders   Future Appointments Provider Department Dept Phone   11/25/2012 10:00 AM Carlynn Purl, DO [facility] [phone]   12/05/2012 8:30 AM York Spaniel, MD [facility] [phone]   Future Orders Complete By Expires   Call MD for:  difficulty breathing, headache or visual disturbances  As directed    Call MD for:  persistant dizziness or light-headedness  As directed    Diet - low sodium heart healthy  As directed    Increase activity slowly  As directed       Consultations:  none  Procedures Performed:  Ct Head Wo Contrast  11/15/2012   *RADIOLOGY REPORT*  Clinical Data: Syncopal episode.  History of hypertension.  CT HEAD WITHOUT CONTRAST  Technique:  Contiguous axial  images were obtained from the base of the skull through the vertex without contrast.  Comparison: Most recent CT head 11/08/2011.  Also prior MR head 07/28/2007.  Findings: There is no evidence for acute infarction, intracranial hemorrhage, mass lesion, hydrocephalus, or extra-axial fluid.  Mild atrophy.  Mild chronic microvascular ischemic change.  Calvarium intact.  No sinus or mastoid disease.  Dense lenticular opacities. Scalp and other extracranial soft tissues unremarkable.  Similar appearance to priors.  IMPRESSION: Mild atrophy and chronic microvascular ischemic change.  No acute intracranial findings.   Original Report Authenticated By: Davonna Belling, M.D.   Mr [facility] Wo  Contrast  11/16/2012   *RADIOLOGY REPORT*  Clinical Data:  Syncopal episode.  Dizziness.  Stroke risk factors include hypertension, dyslipidemia, and tobacco abuse.  MRI HEAD WITHOUT CONTRAST MRA HEAD WITHOUT CONTRAST  Technique:  Multiplanar, multiecho pulse sequences of the brain and surrounding structures were obtained without intravenous contrast. Angiographic images of the head were obtained using MRA technique without contrast.  Comparison:  CT head 11/15/2012.  MR head 07/28/2007.  MRI HEAD  Findings:  There is no evidence for acute infarction, intracranial hemorrhage, mass lesion, hydrocephalus, or extra-axial fluid.  Mild cerebral and cerebellar atrophy. Mild to moderate chronic microvascular ischemic change affects the periventricular and subcortical white matter.  No large vessel infarct.  No foci of chronic hemorrhage.  Flow voids are maintained in the major intracranial vascular structures.  No osseous lesions are evident. There is no sinus or mastoid disease.  Negative orbits.  Normal midline structures. Compared with prior CT head there is no change. Compared with prior MR 2009, there is slight progression of small vessel disease.  IMPRESSION: Mild atrophy.  Mild to moderate small vessel disease.  No acute intracranial findings.  MRA HEAD  Findings: The internal carotid arteries are dolichoectatic but widely patent.  The basilar artery is widely patent with left vertebral as the dominant contributor.  The right vertebral primarily terminates in PICA with a rudimentary basilar connection. There is no proximal stenosis of the anterior, middle, or posterior cerebral arteries.  There is no visible cerebellar branch occlusion.  No aneurysm is seen.  IMPRESSION: Dolichoectatic but widely patent intracranial vasculature.   Original Report Authenticated By: Davonna Belling, M.D.   Mr Brain Wo Contrast  11/16/2012   *RADIOLOGY REPORT*  Clinical Data:  Syncopal episode.  Dizziness.  Stroke risk factors include  hypertension, dyslipidemia, and tobacco abuse.  MRI HEAD WITHOUT CONTRAST MRA HEAD WITHOUT CONTRAST  Technique:  Multiplanar, multiecho pulse sequences of the brain and surrounding structures were obtained without intravenous contrast. Angiographic images of the head were obtained using MRA technique without contrast.  Comparison:  CT head 11/15/2012.  MR head 07/28/2007.  MRI HEAD  Findings:  There is no evidence for acute infarction, intracranial hemorrhage, mass lesion, hydrocephalus, or extra-axial fluid.  Mild cerebral and cerebellar atrophy. Mild to moderate chronic microvascular ischemic change affects the periventricular and subcortical white matter.  No large vessel infarct.  No foci of chronic hemorrhage.  Flow voids are maintained in the major intracranial vascular structures.  No osseous lesions are evident. There is no sinus or mastoid disease.  Negative orbits.  Normal midline structures. Compared with prior CT head there is no change. Compared with prior MR 2009, there is slight progression of small vessel disease.  IMPRESSION: Mild atrophy.  Mild to moderate small vessel disease.  No acute intracranial findings.  MRA HEAD  Findings: The internal carotid arteries are dolichoectatic but  widely patent.  The basilar artery is widely patent with left vertebral as the dominant contributor.  The right vertebral primarily terminates in PICA with a rudimentary basilar connection. There is no proximal stenosis of the anterior, middle, or posterior cerebral arteries.  There is no visible cerebellar branch occlusion.  No aneurysm is seen.  IMPRESSION: Dolichoectatic but widely patent intracranial vasculature.   Original Report Authenticated By: Davonna Belling, M.D.   Dg Chest Port 1 View  11/15/2012   *RADIOLOGY REPORT*  Clinical Data: Hypertension.  PORTABLE CHEST - 1 VIEW  Comparison: 12/20/2011  Findings: The cardiac silhouette is normal in size and configuration.  The mediastinum is normal in contour  caliber. There are no hilar masses.  The lungs are mildly hyperexpanded.  The lungs are clear.  No pleural effusion or pneumothorax.  The bony thorax is demineralized but grossly intact.  IMPRESSION: No acute cardiopulmonary disease.  No change from the prior study.   Original Report Authenticated By: Amie Portland, M.D.    2D Echo: 11/16/12 Study Conclusions: - Left ventricle: The cavity size was normal. Systolic function was normal. The estimated ejection fraction was in the range of 55% to 60%. Wall motion was normal; there were no regional wall motion abnormalities. Doppler parameters are consistent with abnormal left ventricular relaxation (grade 1 diastolic dysfunction). - Mitral valve: Mild regurgitation. - Right ventricle: The cavity size was mildly dilated. Wall thickness was normal.  Admission HPI: [name] is a 72 y.o. male w/ PMHx of HTN, ?COPD, dyslipidemia, gout, and OA, presents to the ED after an episode of syncope this AM. He said he went outside to cut the grass before it started to rain and stepped off of his riding lawn mower when he started to feel weak in his legs until they gave out. He said he was dizzy and his surroundings were spinning when he went down to the ground. He denies hitting his head. He said he was on the ground feeling dizzy and thinks he lost his consciousness at this time. It is unclear from the patient how long he thought he was unconscious for, however the ED note says for ~3 hours. He denies any tongue biting, voiding of bowel or bladder, nausea, vomiting, or palpitations. The patient says he has been feeling weird in the last several months involving brief episodes of dizziness and tremulousness prior to eating a meal. He says he has checked his blood sugar during one of these moments and it has never been below 100. The patient is a relatively poor historian and the description of these similar "episodes" is very unclear. Otherwise, the patient  denies any chest pain, SOB, cough, history of arrhythmias, history of stroke, change in vision, or slurring of speech. The patient was shown to have spinal stenosis noted on an MRI from 10/07/12 around the region of L4-L5. He takes several medications that can cause fluid depletion such as Lasix, Chlorthalidone, and Lactulose, and also takes Flomax for his prostate. He also takes Viagra, but has not done so for a couple months.  On arrival to the ED, the patient was noted to have an elevated lactate to 3.0 and Cr of 1.42. Hypotension was not seen w/ orthostatics (139/77 to 144/103 from lying to standing), however, his pulse increased from 77 to 107 when changing from lying to standing.  Hospital Course by problem list: Principal Problem:   Syncope Active Problems:   DYSLIPIDEMIA   OBESITY   NICOTINE ADDICTION  DEPRESSION   HYPERTENSION   COPD   CONSTIPATION   Gout   1. Syncope- Patient presented to the ED after he stepped off of his riding lawnmower and his legs became weak and he fell to the ground, losing consciousness after he fell to the ground. He says he did not hit his head, bite his tongue, void bowel or bladder, have palpitations, nausea, vomiting or chest pain. He has been taking several medications at home that could potentially cause syncope when combined, including Lasix, Atenolol+Chlorthalidone, Lactulose, Viagra, and Flomax. The patient was given 1.5L of fluid in the ED and orthostatics were -ve for a significant change in BP, but the patient's pulse did increase from 77 to 107 when changing from lying to standing. It was unclear if orthostatics had been performed before or after the patient was bolused with fluids. He was admitted to telemetry for further workup. CT head showed no acute abnormalities, ECHO showed EF of 55-60% w/ no wall motion abnormalities, but w/ grade 1 diastolic dysfunction. MRI/MRA were performed and showed on acute ischemic changes. The patient was started on 75  ml/hr NS on the floor and he reported feeling much better the next day. Lasix was held during his admission. Patient also reported some other non-specific symptoms during his admission involving feeling weak prior to eating. He reports checking his blood sugar at home and says he is never below 100, therefore it is unlikely the patient's symptoms are 2/2 hypoglycemia. Several lab tests were run including TSH, B12, and AM cortisol. TSH came back low, however fT4 was normal. B12 levels were wnl, but AM cortisol was low at 6.2. In order to rule out adrenal insufficiency, an ACTH stimulation test was performed as well. Baseline cortisol prior to cosyntropin injection was 4.8 (low). He was injected w/ IV cosyntropin 0.25 mg. Cortisol level at 30 minutes was 16.5, and at 1 hour it was 20.2, signifying an adequate response. Patient was discharged home with adequate follow up and relief of all symptoms including dizziness and lightheadedness. Patient was changed to 20 mg lasix qd (from 40 qd) and lactulose for constipation was changed to Miralax prn for constipation to alleviate any overt loss of fluid.  2. HTN- Well controlled at home. During his admission, SBP ranged from 110's-130's. Continued Atenolol-Chlorthalidone 50-25. Discharged with no change to this medication use.  3. COPD- No recent issues w/ COPD, uses duoneb at home. Started on albuterol/atrovent prn. No issues during his admission. History of smoking 1/2 daily for >40 years. Given Nicoderm CQ for this.   4. Gout- No issues with gout at this time. Apparently he takes Prednisone 20 mg daily for this at home but has not domes so for quite some time. Has not had any recent flare-ups. No intervention during this admission.  Discharge Vitals:   BP 128/74  Pulse 71  Temp(Src) 98.1 F (36.7 C) (Oral)  Resp 16  Ht 6\' 8"  (2.032 m)  Wt 275 lb (124.739 kg)  BMI 30.21 kg/m2  SpO2 98%  Discharge Labs:  Results for orders placed during the hospital  encounter of 11/15/12 (from the past 24 hour(s))  CORTISOL     Status: None   Collection Time    11/17/12  4:45 AM      Result Value Range   Cortisol, Plasma 6.2    CBC WITH DIFFERENTIAL     Status: Abnormal   Collection Time    11/17/12  4:45 AM      Result  Value Range   WBC 6.5  4.0 - 10.5 K/uL   RBC 4.17 (*) 4.22 - 5.81 MIL/uL   Hemoglobin 13.2  13.0 - 17.0 g/dL   HCT 16.1  09.6 - 04.5 %   MCV 93.8  78.0 - 100.0 fL   MCH 31.7  26.0 - 34.0 pg   MCHC 33.8  30.0 - 36.0 g/dL   RDW 40.9  81.1 - 91.4 %   Platelets 162  150 - 400 K/uL   Neutrophils Relative % 50  43 - 77 %   Neutro Abs 3.2  1.7 - 7.7 K/uL   Lymphocytes Relative 39  12 - 46 %   Lymphs Abs 2.6  0.7 - 4.0 K/uL   Monocytes Relative 7  3 - 12 %   Monocytes Absolute 0.5  0.1 - 1.0 K/uL   Eosinophils Relative 4  0 - 5 %   Eosinophils Absolute 0.2  0.0 - 0.7 K/uL   Basophils Relative 0  0 - 1 %   Basophils Absolute 0.0  0.0 - 0.1 K/uL  BASIC METABOLIC PANEL     Status: Abnormal   Collection Time    11/17/12  4:45 AM      Result Value Range   Sodium 136  135 - 145 mEq/L   Potassium 3.4 (*) 3.5 - 5.1 mEq/L   Chloride 100  96 - 112 mEq/L   CO2 29  19 - 32 mEq/L   Glucose, Bld 93  70 - 99 mg/dL   BUN 11  6 - 23 mg/dL   Creatinine, Ser 7.82  0.50 - 1.35 mg/dL   Calcium 8.8  8.4 - 95.6 mg/dL   GFR calc non Af Amer 58 (*) >90 mL/min   GFR calc Af Amer 67 (*) >90 mL/min  T4, FREE     Status: None   Collection Time    11/17/12 12:54 PM      Result Value Range   Free T4 1.47  0.80 - 1.80 ng/dL  VITAMIN O13     Status: None   Collection Time    11/17/12 12:54 PM      Result Value Range   Vitamin B-12 534  211 - 911 pg/mL    Signed: Lars Masson, MD 11/17/2012, 4:59 PM   Time Spent on Discharge: 35 minutes Services Ordered on Discharge: none Equipment Ordered on Discharge: none

## 2012-11-17 NOTE — Progress Notes (Signed)
Subjective:  [name] is a 72 y.o. male w/ PMHx of HTN, ?COPD, dyslipidemia, gout, and OA, presents to the ED after an episode of syncope on the morning of 11/15/12.  Patient seen at bedside this AM. York Spaniel he is feeling great today and ready to go home. Does not have any complaints.  Patient had MRI/MRA yesterday, no acute intracranial abnormalities. ECHO performed yesterday showed EF of 55-60%, w/ no wall motion abnormalities and a grade 1 diastolic dysfunction. EEG results still pending. Do no expect any abnormalities as the patient did not describe a history suggestive of seizures.  Objective: Vital signs in last 24 hours: Filed Vitals:   11/16/12 1106 11/16/12 1411 11/16/12 2153 11/17/12 0649  BP: 128/82 121/65 123/67 136/83  Pulse: 71 77 77 70  Temp: 97 F (36.1 C) 98.1 F (36.7 C) 97.4 F (36.3 C) 98.1 F (36.7 C)  TempSrc:  Oral Oral Oral  Resp: 16 18 18 18   Height: 6\' 8"  (2.032 m)     Weight: 275 lb (124.739 kg)     SpO2: 100% 98% 96% 97%   Weight change:   Intake/Output Summary (Last 24 hours) at 11/17/12 0815 Last data filed at 11/17/12 0600  Gross per 24 hour  Intake   1680 ml  Output   2900 ml  Net  -1220 ml   Physical Exam: General: Alert, cooperative, and in no apparent distress HEENT: Vision grossly intact, oropharynx clear and non-erythematous  Neck: Full range of motion without pain, supple, no lymphadenopathy or carotid bruits Lungs: Clear to ascultation bilaterally, normal work of respiration, no wheezes, rales, ronchi Heart: Regular rate and rhythm, no murmurs, gallops, or rubs Abdomen: Soft, non-tender, non-distended, normal bowel sounds Extremities: +1 pitting edema in LE's today. Neurologic: Alert & oriented X3, cranial nerves II-XII intact, strength grossly intact, sensation intact to light touch  Lab Results: Basic Metabolic Panel:  Recent Labs Lab 11/16/12 0435 11/17/12 0445  NA 139 136  K 3.8 3.4*  CL 103 100  CO2 31 29    GLUCOSE 108* 93  BUN 13 11  CREATININE 1.25 1.23  CALCIUM 8.8 8.8   Liver Function Tests:  Recent Labs Lab 11/16/12 0435  AST 14  ALT 14  ALKPHOS 42  BILITOT 0.3  PROT 6.6  ALBUMIN 3.1*   CBC:  Recent Labs Lab 11/16/12 0435 11/17/12 0445  WBC 11.8* 6.5  NEUTROABS  --  3.2  HGB 13.5 13.2  HCT 39.2 39.1  MCV 93.3 93.8  PLT 182 162   Cardiac Enzymes:  Recent Labs Lab 11/15/12 1549 11/15/12 1601 11/16/12 0809  CKTOTAL  --  120  --   TROPONINI <0.30  --  <0.30   BNP:  Recent Labs Lab 11/15/12 1549  PROBNP 22.6   Hemoglobin A1C:  Recent Labs Lab 11/15/12 1601  HGBA1C 6.0*   Fasting Lipid Panel:  Recent Labs Lab 11/16/12 0435  CHOL 154  HDL 36*  LDLCALC 93  TRIG 161  CHOLHDL 4.3   Thyroid Function Tests:  Recent Labs Lab 11/15/12 1601  TSH 0.348*   Urinalysis:  Recent Labs Lab 11/15/12 1301  COLORURINE YELLOW  LABSPEC 1.012  PHURINE 7.0  GLUCOSEU NEGATIVE  HGBUR NEGATIVE  BILIRUBINUR NEGATIVE  KETONESUR NEGATIVE  PROTEINUR 30*  UROBILINOGEN 0.2  NITRITE NEGATIVE  LEUKOCYTESUR NEGATIVE   Studies/Results: Ct Head Wo Contrast  11/15/2012   *RADIOLOGY REPORT*  Clinical Data: Syncopal episode.  History of hypertension.  CT HEAD WITHOUT CONTRAST  Technique:  Contiguous axial images were obtained from the base of the skull through the vertex without contrast.  Comparison: Most recent CT head 11/08/2011.  Also prior MR head 07/28/2007.  Findings: There is no evidence for acute infarction, intracranial hemorrhage, mass lesion, hydrocephalus, or extra-axial fluid.  Mild atrophy.  Mild chronic microvascular ischemic change.  Calvarium intact.  No sinus or mastoid disease.  Dense lenticular opacities. Scalp and other extracranial soft tissues unremarkable.  Similar appearance to priors.  IMPRESSION: Mild atrophy and chronic microvascular ischemic change.  No acute intracranial findings.   Original Report Authenticated By: Davonna Belling, M.D.    Mr [facility] Wo Contrast  11/16/2012   *RADIOLOGY REPORT*  Clinical Data:  Syncopal episode.  Dizziness.  Stroke risk factors include hypertension, dyslipidemia, and tobacco abuse.  MRI HEAD WITHOUT CONTRAST MRA HEAD WITHOUT CONTRAST  Technique:  Multiplanar, multiecho pulse sequences of the brain and surrounding structures were obtained without intravenous contrast. Angiographic images of the head were obtained using MRA technique without contrast.  Comparison:  CT head 11/15/2012.  MR head 07/28/2007.  MRI HEAD  Findings:  There is no evidence for acute infarction, intracranial hemorrhage, mass lesion, hydrocephalus, or extra-axial fluid.  Mild cerebral and cerebellar atrophy. Mild to moderate chronic microvascular ischemic change affects the periventricular and subcortical white matter.  No large vessel infarct.  No foci of chronic hemorrhage.  Flow voids are maintained in the major intracranial vascular structures.  No osseous lesions are evident. There is no sinus or mastoid disease.  Negative orbits.  Normal midline structures. Compared with prior CT head there is no change. Compared with prior MR 2009, there is slight progression of small vessel disease.  IMPRESSION: Mild atrophy.  Mild to moderate small vessel disease.  No acute intracranial findings.  MRA HEAD  Findings: The internal carotid arteries are dolichoectatic but widely patent.  The basilar artery is widely patent with left vertebral as the dominant contributor.  The right vertebral primarily terminates in PICA with a rudimentary basilar connection. There is no proximal stenosis of the anterior, middle, or posterior cerebral arteries.  There is no visible cerebellar branch occlusion.  No aneurysm is seen.  IMPRESSION: Dolichoectatic but widely patent intracranial vasculature.   Original Report Authenticated By: Davonna Belling, M.D.   Mr Brain Wo Contrast  11/16/2012   *RADIOLOGY REPORT*  Clinical Data:  Syncopal episode.  Dizziness.  Stroke  risk factors include hypertension, dyslipidemia, and tobacco abuse.  MRI HEAD WITHOUT CONTRAST MRA HEAD WITHOUT CONTRAST  Technique:  Multiplanar, multiecho pulse sequences of the brain and surrounding structures were obtained without intravenous contrast. Angiographic images of the head were obtained using MRA technique without contrast.  Comparison:  CT head 11/15/2012.  MR head 07/28/2007.  MRI HEAD  Findings:  There is no evidence for acute infarction, intracranial hemorrhage, mass lesion, hydrocephalus, or extra-axial fluid.  Mild cerebral and cerebellar atrophy. Mild to moderate chronic microvascular ischemic change affects the periventricular and subcortical white matter.  No large vessel infarct.  No foci of chronic hemorrhage.  Flow voids are maintained in the major intracranial vascular structures.  No osseous lesions are evident. There is no sinus or mastoid disease.  Negative orbits.  Normal midline structures. Compared with prior CT head there is no change. Compared with prior MR 2009, there is slight progression of small vessel disease.  IMPRESSION: Mild atrophy.  Mild to moderate small vessel disease.  No acute intracranial findings.  MRA HEAD  Findings: The internal carotid  arteries are dolichoectatic but widely patent.  The basilar artery is widely patent with left vertebral as the dominant contributor.  The right vertebral primarily terminates in PICA with a rudimentary basilar connection. There is no proximal stenosis of the anterior, middle, or posterior cerebral arteries.  There is no visible cerebellar branch occlusion.  No aneurysm is seen.  IMPRESSION: Dolichoectatic but widely patent intracranial vasculature.   Original Report Authenticated By: Davonna Belling, M.D.   Dg Chest Port 1 View  11/15/2012   *RADIOLOGY REPORT*  Clinical Data: Hypertension.  PORTABLE CHEST - 1 VIEW  Comparison: 12/20/2011  Findings: The cardiac silhouette is normal in size and configuration.  The mediastinum is  normal in contour caliber. There are no hilar masses.  The lungs are mildly hyperexpanded.  The lungs are clear.  No pleural effusion or pneumothorax.  The bony thorax is demineralized but grossly intact.  IMPRESSION: No acute cardiopulmonary disease.  No change from the prior study.   Original Report Authenticated By: Amie Portland, M.D.   Medications: I have reviewed the patient's current medications. Scheduled Meds: . atenolol  50 mg Oral Q24H  . chlorthalidone  25 mg Oral Q24H  . heparin  5,000 Units Subcutaneous Q8H  . mometasone-formoterol  2 puff Inhalation BID  . nicotine  14 mg Transdermal Q24H  . sodium chloride  3 mL Intravenous Q12H  . sodium chloride  3 mL Intravenous Q12H   Continuous Infusions:   PRN Meds:.sodium chloride, acetaminophen, acetaminophen, albuterol, ipratropium, lactulose, naproxen, sodium chloride  Assessment/Plan: Mr. MARTINEZ BOXX is a 72 y.o. male w/ PMHx of HTN, ?COPD, dyslipidemia, gout, and OA, presents to the ED after an episode of syncope on the morning of 11/15/12.  #Syncope- Patient was stepping off of his riding lawnmower on 11/15/12 and felt his legs become weak, and he slowly fell to the ground. He felt his surroundings start to spin and it is thought that at this time he lost consciousness. The exact details of the event are very unclear as the patient is a very poor historian. Denies any tongue biting, voiding of bowel or bladder, or palpitations. This event is most likely d/t intravascular volume deletion 2/2 medications. This differential diagnosis was further supported on more discussion w/ patient today. Said he had taken 2 doses of lasix the day previously and felt very thirsty the morning of his syncopal episode. He takes both lasix and chlorthalidone, in addition to Flomax for BPH and lactulose for constipation. On admission, Cr was 1.43, today it is 1.23. Patient did have lactate of 3.0 on admission, however on repeat it was normal at 1.8.  Patient feels much better this AM. -Discontinued fluids yesterday as he is eating and drinking well. -MRI/MRA showed no acute findings suggestive of stroke. -ECHO revealed EF of 55-60% w/ no wall motion abnormalities and grade 1 diastolic dysfunction.  -pBNP 22, wnl.  -Repeat troponin  (x2) -ve. -CK not elevated at 120.  -Performed EEG today to r/o possible seizure activity, however at this time it is unlikely given the patient's history. -Lipid panel wnl -Patient describes LE weakness prior to episode of weakness and also has LUE grip strength weakness, however this was noted to be a chronic issue. PT/OT consulted yesterday, do not suggest SNF placement. They did however suggest some supervision at home, which is a possibility as the patient lives with his girlfriend.  -AM Cortisol 6.2. <10 is considered low, while <3 is considered adrenal insufficiency. Cosyntropin stimulation test will be  performed to determine if patient has adrenal insufficiency, which may help some of the patient's non-specific symptoms. -Also, patient was found to have low TSH at 0.348. Sent for fT4. -Given patient's non-specific symptoms of "tingling" in the hands and feet, a B12 level was also ordered, results pending.  #Mild Leukocytosis- No elevation of WBC's on admission, but patient presented w/ elevated lactate at 3.0, however on repeat, it was 1.8. Yesterday morning, WBC's were 11.8, only slightly elevated. Today, WBC's have returned to normal, 6.5. -Sent for BCx x 2 yesteday. Still pending. Do not suspect infectious process at this time.  #Dyslipidemia- Previous lipids unknown. Fasting lipid panel performed 11/16/12, shows total cholesterol of 154, triglycerides 125, HDL 36, and LDL 93. No statin necessary at this time.  #HTN- BP stable at 136/83 this AM. Continue home atenolol-chlorthalidone.  -Still holding Lasix for now.   #COPD- Patient has been smoking .5 packs/day for >40years.  -Continued home regimen  COPD medications. Albuterol/atrovent prn.   #Constipation- Continue Lactulose   #Gout- Apparently takes Prednisone for gout attacks, however he rarely takes this medication. Held for now.   #Nicotine abuse- Given Nicoderm patch 14 mg   DVT/PE PPx- Heparin  Dispo: Patient in good condition.  Anticipated discharge today.   The patient does have a current PCP Kerri Perches, MD) and does need an [place] hospital follow-up appointment after discharge.  The patient does not have transportation limitations that hinder transportation to clinic appointments.  .Services Needed at time of discharge: Y = Yes, Blank = No PT: N  OT: N  RN: N  Equipment: N  Other: N    LOS: 2 days   Lars Masson, MD 11/17/2012, 8:15 AM Pager: [phone]

## 2012-11-17 NOTE — Progress Notes (Signed)
Internal Medicine Attending  Date: 11/17/2012  Patient name: Jonathan [name] Medical record number: [id number] Date of birth: 05-Feb-1941 Age: 72 y.o. Gender: male  I saw and evaluated the patient on a.m. rounds with house staff.  I reviewed the resident's note by Dr. Yetta Barre and I agree with the resident's findings and plans as documented in his note.

## 2012-11-17 NOTE — Progress Notes (Signed)
Physical Therapy Treatment Patient Details Name: Jonathan [name] MRN: [id number] DOB: 1940/04/29 Today's Date: 11/17/2012 Time: 4782-9562 PT Time Calculation (min): 23 min  PT Assessment / Plan / Recommendation  History of Present Illness Patient is a 72 year old man with history of hypertension, gout, dyslipidemia, arthritis, spinal stenosis, and other problems as outlined in the medical history admitted following an episode of loss of consciousness with fall.   PT Comments   Pt continues to demonstrate deficits in mobility and balance. Upon further assessment, feel patient may benefit from in home balance program with HHPT. Spoke with patient at length regarding expectations for mobility, discharge recommendations and activities to assist with safety.  Will continue to see as indicated and progress activity as tolerated.   Follow Up Recommendations  Home health PT           Equipment Recommendations  None recommended by PT       Frequency Min 3X/week   Progress towards PT Goals Progress towards PT goals: Progressing toward goals  Plan Discharge plan needs to be updated    Precautions / Restrictions Precautions Precautions: Fall   Pertinent Vitals/Pain Patient reports no pain or dizziness at this time.    Mobility  Bed Mobility Bed Mobility: Supine to Sit;Sit to Supine Supine to Sit: 7: Independent Sit to Supine: 7: Independent Transfers Transfers: Sit to Stand;Stand to Sit Sit to Stand: 6: Modified independent (Device/Increase time);With upper extremity assist Stand to Sit: 6: Modified independent (Device/Increase time);With upper extremity assist Ambulation/Gait Ambulation/Gait Assistance: 4: Min guard Ambulation Distance (Feet): 60 Feet Assistive device: None Ambulation/Gait Assistance Details: Patient with noted balance checks requiring use of furniture and rails for support.  Gait Pattern: Step-through pattern;Decreased step length - right;Decreased step length -  left;Trunk flexed Gait velocity: Slow gait speed General Gait Details: Unsteady with ambulation, heavy reliance on "furniture surfing"      PT Goals (current goals can now be found in the care plan section) Acute Rehab PT Goals Patient Stated Goal: To go home soon PT Goal Formulation: With patient Time For Goal Achievement: 11/23/12 Potential to Achieve Goals: Good  Visit Information  Last PT Received On: 11/17/12 Assistance Needed: +1 History of Present Illness: Patient is a 72 year old man with history of hypertension, gout, dyslipidemia, arthritis, spinal stenosis, and other problems as outlined in the medical history admitted following an episode of loss of consciousness with fall.    Subjective Data  Subjective: my balance is terrible Patient Stated Goal: To go home soon   Cognition  Cognition Arousal/Alertness: Awake/alert Behavior During Therapy: WFL for tasks assessed/performed Overall Cognitive Status: Within Functional Limits for tasks assessed    Balance  Static Sitting Balance Static Sitting - Balance Support: No upper extremity supported;Feet supported Static Sitting - Level of Assistance: 7: Independent Static Standing Balance Static Standing - Balance Support: No upper extremity supported Static Standing - Level of Assistance: 5: Stand by assistance Static Standing - Comment/# of Minutes: 5 minutes, during conversation, required support from rail as he bacame fatigued  End of Session PT - End of Session Equipment Utilized During Treatment: Gait belt Activity Tolerance: Patient limited by fatigue Patient left: in chair Nurse Communication: Mobility status   GP     Fabio Asa 11/17/2012, 3:25 PM Charlotte Crumb, PT DPT  [phone]

## 2012-11-17 NOTE — Procedures (Signed)
ELECTROENCEPHALOGRAM REPORT   Patient: MARQUEZE RAMCHARAN       Room #: 4V40 EEG No. ID: [id number] Age: 72 y.o.        Sex: male Referring Physician: Brooke Dare Report Date:  11/17/2012        Interpreting Physician: Aline Brochure  History: ARYE WEYENBERG is an 72 y.o. male history of hypertension, echo, dyslipidemia, arthritis and spinal stenosis, who presented following an episode of loss of consciousness at home, will likely syncopal in etiology.    Indications for study:  Rule out new onset seizure disorder.  Technique: This is an 18 channel routine scalp EEG performed at the bedside with bipolar and monopolar montages arranged in accordance to the international 10/20 system of electrode placement.   Description:  This EEG was recorded during wakefulness and during sleep. Predominant background activity during wakefulness consisted of 9 Hz symmetrical alpha rhythm, which attenuated well with eye opening. Photic stimulation produced a symmetrical occipital driving response. Hyperventilation was not performed. It was symmetrical slowing of background activity during sleep. Symmetrical vertex waves sleep spindles and K-complexes are recorded during stage II of sleep. No epileptiform discharges recorded. There was no abnormal slowing.   Interpretation: This is a normal EEG recording during wakefulness and during sleep. No evidence of an epileptic disorder was demonstrated.  Venetia Maxon M.D. [facility] [phone]

## 2012-11-17 NOTE — Progress Notes (Signed)
After hours case manager notified of patient's new HH PT order.  Patient notified that he will receive a call the morning after discharge to set up home health.  Patient stable with no S/S of distress.  Medication and discharge information reviewed with patient and patient's family.  Patient DC home with family. [place], Mitzi Hansen

## 2012-11-17 NOTE — Evaluation (Signed)
Occupational Therapy Evaluation Patient Details Name: Jonathan [name] MRN: [id number] DOB: Sep 10, 1940 Today's Date: 11/17/2012 Time: 1210-1240 OT Time Calculation (min): 30 min  OT Assessment / Plan / Recommendation History of present illness Patient is a 72 year old man with history of hypertension, gout, dyslipidemia, arthritis, spinal stenosis, and other problems as outlined in the medical history admitted following an episode of loss of consciousness with fall.   Clinical Impression   Pt reports he is back to his baseline in mobility and ADL.  All equipment needs are met.  No further OT needs.    OT Assessment  Patient does not need any further OT services    Follow Up Recommendations  No OT follow up    Barriers to Discharge      Equipment Recommendations  None recommended by OT    Recommendations for Other Services    Frequency       Precautions / Restrictions     Pertinent Vitals/Pain VSS, no pain    ADL  Eating/Feeding: Independent Where Assessed - Eating/Feeding: Edge of bed Grooming: Wash/dry hands;Modified independent (leans on sink) Where Assessed - Grooming: Unsupported standing Upper Body Bathing: Independent Where Assessed - Upper Body Bathing: Unsupported sitting Lower Body Bathing: Modified independent Where Assessed - Lower Body Bathing: Unsupported sitting;Supported sit to stand Upper Body Dressing: Independent Where Assessed - Upper Body Dressing: Unsupported sitting Lower Body Dressing: Modified independent Where Assessed - Lower Body Dressing: Unsupported sitting;Supported sit to stand Toilet Transfer: Modified independent Toilet Transfer Method: Sit to Barista: Grab bars;Regular height toilet Toileting - Clothing Manipulation and Hygiene: Modified independent Where Assessed - Toileting Clothing Manipulation and Hygiene: Standing Transfers/Ambulation Related to ADLs: ambulated in room steadying himself minimally with  furniture/sink ADL Comments: No difficulty reaching feet.  Has all necessary DME at home from the [place].    OT Diagnosis:    OT Problem List:   OT Treatment Interventions:     OT Goals(Current goals can be found in the care plan section) Acute Rehab OT Goals Patient Stated Goal: To go home soon  Visit Information  Last OT Received On: 11/17/12 History of Present Illness: Patient is a 72 year old man with history of hypertension, gout, dyslipidemia, arthritis, spinal stenosis, and other problems as outlined in the medical history admitted following an episode of loss of consciousness with fall.       Prior Functioning     Home Living Family/patient expects to be discharged to:: Private residence Living Arrangements: Alone Available Help at Discharge: Available PRN/intermittently has paid help for housekeeping Type of Home: House Home Access: Ramped entrance Home Layout: One level Home Equipment: Environmental consultant - 2 wheels;Shower seat;Bedside commode;Electric scooter Prior Function Level of Independence: Independent with assistive device(s) Communication Communication: No difficulties Dominant Hand: Right         Vision/Perception Vision - History Patient Visual Report: No change from baseline   Cognition  Cognition Arousal/Alertness: Awake/alert Behavior During Therapy: WFL for tasks assessed/performed Overall Cognitive Status: Within Functional Limits for tasks assessed    Extremity/Trunk Assessment Upper Extremity Assessment Upper Extremity Assessment: Overall WFL for tasks assessed Lower Extremity Assessment Lower Extremity Assessment: Defer to PT evaluation Cervical / Trunk Assessment Cervical / Trunk Assessment: Normal     Mobility Bed Mobility Bed Mobility: Supine to Sit;Sit to Supine Supine to Sit: 7: Independent Sit to Supine: 7: Independent Transfers Transfers: Sit to Stand;Stand to Sit Sit to Stand: 6: Modified independent (Device/Increase time);With upper  extremity assist Stand to Sit: 6:  Modified independent (Device/Increase time);With upper extremity assist     Exercise     Balance     End of Session OT - End of Session Activity Tolerance: Patient tolerated treatment well Patient left: in bed;with call bell/phone within reach  GO     Evern Bio 11/17/2012, 12:56 PM [phone]

## 2012-11-18 LAB — ACTH STIMULATION, 3 TIME POINTS
Cortisol, 30 Min: 16.5 ug/dL — ABNORMAL LOW (ref >20)
Cortisol, 60 Min: 20.2 ug/dL (ref >20)
Cortisol, Base: 4.8 ug/dL

## 2012-11-18 NOTE — Progress Notes (Signed)
On Call CM received a call from 3w RN concerning HH orders written for pt to dc home with PT. Pt selected [facility]. Referral was given to in house rep with [facility].

## 2012-11-18 NOTE — Care Management Note (Signed)
    Page 1 of 1   11/18/2012     11:44:49 AM   CARE MANAGEMENT NOTE 11/18/2012  Patient:  Jonathan [name], Jonathan [name]   Account Number:  [id number]  Date Initiated:  11/18/2012  Documentation initiated by:  GRAVES-BIGELOW,Jesica Goheen  Subjective/Objective Assessment:   Pt in with syncope. He lives in [place] and has no family. Pt states he has DME from the [place].     Action/Plan:   CM did call the pt today and he will need HHPT services and SW for resources with transportation and aide services. CM did make referral for [facility]. SOC to begin within 24-48 hours post d/c.   Anticipated DC Date:  11/17/2012   Anticipated DC Plan:  HOME W HOME HEALTH SERVICES      DC Planning Services  CM consult      [facility] Choice  HOME HEALTH   Choice offered to / List presented to:  C-1 Patient        HH arranged  HH-2 PT  HH-6 SOCIAL WORKER      HH agency  [facility]   Status of service:  Completed, signed off Medicare Important Message given?   (If response is "NO", the following Medicare IM given date fields will be blank) Date Medicare IM given:   Date Additional Medicare IM given:    Discharge Disposition:  HOME W HOME HEALTH SERVICES  Per UR Regulation:  Reviewed for med. necessity/level of care/duration of stay  If discussed at Long Length of Stay Meetings, dates discussed:    Comments:

## 2012-11-19 NOTE — Discharge Summary (Signed)
Please note that patient takes prednisone prescribed by his outpatient physician only during a gout flare; he is currently not taking prednisone at discharge.

## 2012-11-22 LAB — CULTURE, BLOOD (ROUTINE X 2)
Culture: NO GROWTH
Culture: NO GROWTH

## 2012-11-25 ENCOUNTER — Encounter: Payer: Self-pay | Admitting: Internal Medicine

## 2012-11-25 ENCOUNTER — Ambulatory Visit (INDEPENDENT_AMBULATORY_CARE_PROVIDER_SITE_OTHER): Payer: Medicare Other | Admitting: Internal Medicine

## 2012-11-25 VITALS — BP 114/69 | HR 84 | Temp 97.4°F | Ht >= 80 in | Wt 278.4 lb

## 2012-11-25 DIAGNOSIS — K5900 Constipation, unspecified: Secondary | ICD-10-CM

## 2012-11-25 DIAGNOSIS — R55 Syncope and collapse: Secondary | ICD-10-CM

## 2012-11-25 DIAGNOSIS — I10 Essential (primary) hypertension: Secondary | ICD-10-CM

## 2012-11-25 DIAGNOSIS — F17200 Nicotine dependence, unspecified, uncomplicated: Secondary | ICD-10-CM

## 2012-11-25 DIAGNOSIS — J449 Chronic obstructive pulmonary disease, unspecified: Secondary | ICD-10-CM

## 2012-11-25 NOTE — Assessment & Plan Note (Signed)
Doing well with Miralax.  No acute issues.

## 2012-11-25 NOTE — Assessment & Plan Note (Addendum)
Patient denies any further symptoms of near syncope.  BP 114/69 today.  Reports he is feeling much better after switch of lactulose to miralax and decreased dose of lasix.  He does occasionally have episodes of what he describes as vertigo. And has had an increased number of falls but he reports this has been helped with his physical therapy.  He does have an upcoming appointment with Dr. Anne Hahn, Neurology, for spinal stenosis and he reports he also wants to speak to Neurology about vertigo symptoms since other veterans he know who have had exposure to agent orange have experienced these symptoms. Patient to follow up with PCP Dr. Boyce Medici in [place], [name].

## 2012-11-25 NOTE — Assessment & Plan Note (Signed)
Patient currently smoking 1/2 ppd.  Reports less desire to continue than previously.  Cessation encouraged, patient will not give quit date at this time but reports he will continue to try to cut down.  Does not want nicotine replacement therapy. Flu Vaccine offered at this visit, patient refused but reports he will get one at next visit.

## 2012-11-25 NOTE — Patient Instructions (Signed)
1.  Continue to take Lasix 20mg  daily. 2.  Please make an appointment to follow up with you PCP- Dr Boyce Medici 3.  Please keep appointment with Dr. Anne Hahn Neurology. 4.  Please continue to think about smoking cessasation and consider developing a quit date.

## 2012-11-25 NOTE — Assessment & Plan Note (Addendum)
Doing well with Duoneb inhaler, and Advair.  Will continue. Patient refuses Flu vaccination at this time.  Reports he has had Pneumovax 3 years ago.

## 2012-11-25 NOTE — Progress Notes (Addendum)
Patient ID: Jonathan [name], male   DOB: October 29, 1940, 72 y.o.   MRN: [id number]   Subjective:   Patient ID: Jonathan [name] male   DOB: 23-Sep-1940 72 y.o.   MRN: [id number]  HPI: Jonathan [name] is a 73 y.o. male with PMH of HTN, Cystitis, Tobacco Abuse, Gout, who presents today to [facility] for hospital follow up after being admitted for a syncopal episodes.  During patient's hospitalization a CT head an MRI were obtained which showed no acute changes, but did note mild atrophy and small vessel disease.  A TTE was performed which showed EF 55-60% with grade 1 diastolic dysfunction.  Patient was found to have positive orthostatic vital signs.  He was discharged with a decreased dose of lasix and lactulose was changed to miralax for constipation. Patient currently feels much better, does denies any more episodes of falls or near syncopal episodes.  He does have an upcoming appointment with Jonathan [name], Neurology, for which he wishes to ask about spinal stenosis and possible vertigo symptoms.    Past Medical History  Diagnosis Date  . Nicotine addiction   . Obesity   . Dyslipidemia   . Hypertension   . Elevated WBCs September 2011    and UTI Hospitalized   . Erectile dysfunction   . Arthritis    Current Outpatient Prescriptions  Medication Sig Dispense Refill  . aspirin EC 81 MG tablet Take 81 mg by mouth. Take by mouth daily.      [name] atenolol-chlorthalidone (TENORETIC) 50-25 MG per tablet Take 1 tablet by mouth daily.      . Fluticasone-Salmeterol (ADVAIR DISKUS) 100-50 MCG/DOSE AEPB Inhale 1 puff into the lungs as needed (shortness of breath).       . furosemide (LASIX) 40 MG tablet Take 0.5 tablets (20 mg total) by mouth daily.  30 tablet  2  . ipratropium-albuterol (DUONEB) 0.5-2.5 (3) MG/3ML SOLN Take 3 mLs by nebulization 3 (three) times daily as needed (wheezing).       . naproxen (NAPROSYN) 500 MG tablet Take 500 mg by mouth daily as needed (pain).      . Nebulizers (NEBULIZER  COMPRESSOR) MISC by Does not apply route. UAD       . polyethylene glycol (MIRALAX) packet Take 17 g by mouth daily.  14 each  2  . predniSONE (DELTASONE) 10 MG tablet Take 20 mg by mouth daily.       . sildenafil (VIAGRA) 100 MG tablet Take 100 mg by mouth. One tablet 30 minutes before intercourse, maximum twice weekly       . tiotropium (SPIRIVA) 18 MCG inhalation capsule Place 18 mcg into inhaler and inhale daily.         No current facility-administered medications for this visit.   Family History  Problem Relation Age of Onset  . Cancer Father    History   Social History  . Marital Status: Widowed    Spouse Name: N/A    Number of Children: 2  . Years of Education: N/A   Occupational History  . retired    Social History Main Topics  . Smoking status: Current Every Day Smoker -- 0.50 packs/day  . Smokeless tobacco: None  . Alcohol Use: No  . Drug Use: No  . Sexual Activity: None   Other Topics Concern  . None   Social History Narrative  . None   Review of Systems: Review of Systems  Constitutional: Negative for fever, chills, weight loss, malaise/fatigue and diaphoresis.  HENT: Negative for congestion and sore throat.   Eyes: Negative for blurred vision.  Respiratory: Negative for cough and shortness of breath.   Cardiovascular: Negative for chest pain and palpitations.  Gastrointestinal: Negative for nausea, vomiting, abdominal pain, diarrhea and constipation.  Genitourinary: Negative for dysuria, urgency and frequency.  Musculoskeletal: Positive for back pain and falls (none since hospitalization). Negative for myalgias.  Skin: Negative for rash.  Neurological: Negative for dizziness, tingling, loss of consciousness, weakness and headaches.  Psychiatric/Behavioral: Negative for depression.    Objective:  Physical Exam: Filed Vitals:   11/25/12 1015  BP: 114/69  Pulse: 84  Temp: 97.4 F (36.3 C)  TempSrc: Oral  Height: 6\' 8"  (2.032 m)  Weight: 278 lb  6.4 oz (126.281 kg)  SpO2: 94%   Physical Exam  Nursing note and vitals reviewed. Constitutional: He is oriented to person, place, and time and well-developed, well-nourished, and in no distress. No distress.  HENT:  Head: Normocephalic and atraumatic.  Eyes: Conjunctivae are normal. No scleral icterus.  Cardiovascular: Normal rate, regular rhythm and normal heart sounds.   No murmur heard. Pulmonary/Chest: Effort normal and breath sounds normal. No respiratory distress. He has no wheezes. He has no rales.  Abdominal: Soft. Bowel sounds are normal.  Musculoskeletal: He exhibits no edema and no tenderness.  Neurological: He is alert and oriented to person, place, and time. Coordination abnormal.  Gait instability, uses Motorized scooter for transportation  Skin: Skin is warm and dry. He is not diaphoretic.    Assessment & Plan:  See Problem based Assessment and Plan ADDENDUM: No Murmur appreciated.

## 2012-11-25 NOTE — Assessment & Plan Note (Signed)
BP Readings from Last 3 Encounters:  11/25/12 114/69  11/17/12 128/74  11/10/10 148/91    Lab Results  Component Value Date   NA 136 11/17/2012   K 3.4* 11/17/2012   CREATININE 1.23 11/17/2012    Assessment: Blood pressure control: controlled Progress toward BP goal:  at goal Comments: BP well controlled with lower dose of lasix (20mg )  Plan: Medications:  continue current medications Educational resources provided: brochure Self management tools provided:   Other plans: Continue  Atenolol-chlorthalidone, Lasix.

## 2012-11-25 NOTE — Progress Notes (Signed)
I saw and evaluated the patient.  I personally confirmed the key portions of the history and exam documented by Dr. Hoffman and I reviewed pertinent patient test results.  The assessment, diagnosis, and plan were formulated together and I agree with the documentation in the resident's note. 

## 2012-11-28 ENCOUNTER — Other Ambulatory Visit: Payer: Self-pay | Admitting: *Deleted

## 2012-12-04 ENCOUNTER — Encounter: Payer: Self-pay | Admitting: Neurology

## 2012-12-05 ENCOUNTER — Ambulatory Visit (INDEPENDENT_AMBULATORY_CARE_PROVIDER_SITE_OTHER): Payer: Medicare Other | Admitting: Neurology

## 2012-12-05 ENCOUNTER — Encounter: Payer: Self-pay | Admitting: Neurology

## 2012-12-05 VITALS — BP 133/85 | HR 80 | Ht 79.0 in | Wt 278.0 lb

## 2012-12-05 DIAGNOSIS — R6889 Other general symptoms and signs: Secondary | ICD-10-CM

## 2012-12-05 DIAGNOSIS — G5621 Lesion of ulnar nerve, right upper limb: Secondary | ICD-10-CM

## 2012-12-05 DIAGNOSIS — E1142 Type 2 diabetes mellitus with diabetic polyneuropathy: Secondary | ICD-10-CM | POA: Insufficient documentation

## 2012-12-05 DIAGNOSIS — D518 Other vitamin B12 deficiency anemias: Secondary | ICD-10-CM

## 2012-12-05 DIAGNOSIS — G63 Polyneuropathy in diseases classified elsewhere: Secondary | ICD-10-CM

## 2012-12-05 DIAGNOSIS — G5620 Lesion of ulnar nerve, unspecified upper limb: Secondary | ICD-10-CM

## 2012-12-05 NOTE — Progress Notes (Signed)
Reason for visit: Peripheral neuropathy  Jonathan [name] is a 72 y.o. male  History of present illness:  Jonathan [name] is a 71 year old right-handed white male with a history of problems with chronic low back pain. The patient is a poor historian, but he indicates that he began having problems with his back and legs since 2007 following surgery on his bladder. The patient indicates that he has some back pain, and pain down the legs, and some numbness in the feet. The patient describes burning and stinging sensations and tingling in the feet at night, and he may have some difficulty sleeping because of this. The patient has some gait instability, and he uses a walker and a power wheelchair that has been provided to him from the [facility]. The patient also indicates that he has injured his right arm and shoulder following a fall when he had vertigo. The patient has developed a right ulnar neuropathy, and he has difficulty with abduction of his right arm and shoulder. The patient is to be entering physical therapy for gait training and leg strengthening exercises in the near future. The patient has undergone MRI evaluation of the low back that reveals spinal stenosis that is mild at the L2-3 level, moderate at L3-4 level with some impingement of the left L4 nerve root. The patient is not on medications for neuropathy or back pain. The patient is on a chronic dose of prednisone, and he claims that this is for his arthritis. The patient has undergone recent EMG and nerve conduction study evaluation, and the presence of a peripheral neuropathy was determined. The patient is sent for further evaluation of this issue.  Past Medical History  Diagnosis Date  . Nicotine addiction   . Obesity   . Dyslipidemia   . Hypertension   . Elevated WBCs September 2011    and UTI Hospitalized   . Erectile dysfunction   . Arthritis   . Polyneuropathy in other diseases classified elsewhere 12/05/2012  . Lesion of  ulnar nerve 12/05/2012    Right side  . COPD (chronic obstructive pulmonary disease)   . Gait disorder     Past Surgical History  Procedure Laterality Date  . Bladder surgery  02/2007    For Diveeticular Disease  . Colon polyp resection      Family History  Problem Relation Age of Onset  . Cancer Father     Social history:  reports that he has been smoking Cigarettes.  He has been smoking about 0.50 packs per day. He has never used smokeless tobacco. He reports that he does not drink alcohol or use illicit drugs.  Medications:  Current Outpatient Prescriptions on File Prior to Visit  Medication Sig Dispense Refill  . aspirin EC 81 MG tablet Take 81 mg by mouth. Take by mouth daily.      [name] atenolol-chlorthalidone (TENORETIC) 50-25 MG per tablet Take 1 tablet by mouth daily.      . Fluticasone-Salmeterol (ADVAIR DISKUS) 100-50 MCG/DOSE AEPB Inhale 1 puff into the lungs as needed (shortness of breath).       . furosemide (LASIX) 40 MG tablet Take 0.5 tablets (20 mg total) by mouth daily.  30 tablet  2  . ipratropium-albuterol (DUONEB) 0.5-2.5 (3) MG/3ML SOLN Take 3 mLs by nebulization 3 (three) times daily as needed (wheezing).       . naproxen (NAPROSYN) 500 MG tablet Take 500 mg by mouth daily as needed (pain).      . Nebulizers (  NEBULIZER COMPRESSOR) MISC by Does not apply route. UAD       . polyethylene glycol (MIRALAX) packet Take 17 g by mouth daily.  14 each  2  . predniSONE (DELTASONE) 10 MG tablet Take 20 mg by mouth daily.       . sildenafil (VIAGRA) 100 MG tablet Take 100 mg by mouth. One tablet 30 minutes before intercourse, maximum twice weekly       . tiotropium (SPIRIVA) 18 MCG inhalation capsule Place 18 mcg into inhaler and inhale daily.         No current facility-administered medications on file prior to visit.      Allergies  Allergen Reactions  . Ciprofloxacin Rash  . Levaquin  [Levofloxacin] Rash    ROS:  Out of a complete 14 system review of symptoms,  the patient complains only of the following symptoms, and all other reviewed systems are negative.  Fatigue Chest pain, swelling in the legs Rash, moles Blurred vision Shortness of breath, wheezing Impotence  Increased thirst, joint pain, joint swelling, muscle cramps, achy muscles Frequent infections Numbness, weakness, dizziness Depression, anxiety, insomnia, change in appetite, hallucinations, racing thoughts Restless legs  Blood pressure 133/85, pulse 80, height 6\' 7"  (2.007 m), weight 278 lb (126.1 kg).  Physical Exam  General: The patient is alert and cooperative at the time of the examination. The patient is minimally obese.  Head: Pupils are equal, round, and reactive to light. Discs are flat bilaterally.  Neck: The neck is supple, no carotid bruits are noted.  Respiratory: The respiratory examination is clear.  Cardiovascular: The cardiovascular examination reveals a regular rate and rhythm, no obvious murmurs or rubs are noted.  Skin: Extremities are with 1+ edema at the ankles. The patient has limitation of movement with abduction of the right shoulder, pain with elevation.  Neurologic Exam  Mental status:  Cranial nerves: Facial symmetry is present. There is good sensation of the face to pinprick and soft touch bilaterally. The strength of the facial muscles and the muscles to head turning and shoulder shrug are normal bilaterally. Speech is well enunciated, no aphasia or dysarthria is noted. Extraocular movements are full. Visual fields are full.  Motor: The motor testing reveals 5 over 5 strength of all 4 extremities. The patient has giveaway weakness however, on the right arm and right leg associated with pain. Good symmetric motor tone is noted throughout.  Sensory: Sensory testing is intact to pinprick, soft touch, vibration sensation, and position sense on the upper extremities, with the exception that there is some decreased pinprick sensation on the right  hand. With the lower extremities, there is a stocking pattern pinprick sensory deficit one half way up the legs. Vibration sensation and position sensation are mildly impaired in the feet. No evidence of extinction is noted.  Coordination: Cerebellar testing reveals good finger-nose-finger and heel-to-shin bilaterally.  Gait and station: Gait is wide-based, slightly unsteady. Tandem gait is poor. Romberg is negative. No drift is seen.  Reflexes: Deep tendon reflexes are symmetric, but are depressed bilaterally. Toes are downgoing bilaterally.   Assessment/Plan:  1. Peripheral neuropathy  2. Gait disorder  3. Chronic low back pain, spinal stenosis  4. Right ulnar neuropathy  The patient will be sent for blood work today for an evaluation of the neuropathy. The patient is unable to answer whether or not he wants to go on medications for his neuropathy, and no prescriptions were given today. I will contact the patient concerning the blood work  results, and the patient will followup in this office if needed.  Marlan Palau MD 12/05/2012 8:56 PM  [facility] [address] [place], [name] [address]  Phone [phone] Fax [phone]

## 2012-12-09 LAB — IFE AND PE, SERUM
Albumin SerPl Elph-Mcnc: 3.7 g/dL (ref 3.2–5.6)
Albumin/Glob SerPl: 1.2 (ref 0.7–2.0)
Alpha 1: 0.2 g/dL (ref 0.1–0.4)
Alpha2 Glob SerPl Elph-Mcnc: 0.7 g/dL (ref 0.4–1.2)
B-Globulin SerPl Elph-Mcnc: 0.9 g/dL (ref 0.6–1.3)
Gamma Glob SerPl Elph-Mcnc: 1.4 g/dL (ref 0.5–1.6)
Globulin, Total: 3.2 g/dL (ref 2.0–4.5)
IgA/Immunoglobulin A, Serum: 209 mg/dL (ref 91–414)
IgG (Immunoglobin G), Serum: 1698 mg/dL — ABNORMAL HIGH (ref 700–1600)
IgM (Immunoglobulin M), Srm: 58 mg/dL (ref 40–230)
M Protein SerPl Elph-Mcnc: 0.8 g/dL — ABNORMAL HIGH
Total Protein: 6.9 g/dL (ref 6.0–8.5)

## 2012-12-09 LAB — ANGIOTENSIN CONVERTING ENZYME: Angio Convert Enzyme: 31 U/L (ref 14–82)

## 2012-12-09 LAB — VITAMIN B12: Vitamin B-12: 304 pg/mL (ref 211–946)

## 2012-12-09 LAB — TSH: TSH: 0.701 u[IU]/mL (ref 0.450–4.500)

## 2012-12-09 LAB — ANA W/REFLEX: Anti Nuclear Antibody(ANA): NEGATIVE

## 2012-12-09 LAB — LYME, TOTAL AB TEST/REFLEX: Lyme IgG/IgM Ab: <0.91 {ISR} (ref 0.00–0.90)

## 2012-12-09 LAB — RHEUMATOID FACTOR: Rhuematoid fact SerPl-aCnc: 9.6 IU/mL (ref 0.0–13.9)

## 2013-01-29 ENCOUNTER — Other Ambulatory Visit: Payer: Self-pay

## 2013-02-09 DIAGNOSIS — M47817 Spondylosis without myelopathy or radiculopathy, lumbosacral region: Secondary | ICD-10-CM | POA: Insufficient documentation

## 2013-03-31 ENCOUNTER — Telehealth: Payer: Self-pay | Admitting: Neurology

## 2013-03-31 ENCOUNTER — Encounter: Payer: Self-pay | Admitting: Neurology

## 2013-03-31 DIAGNOSIS — M5126 Other intervertebral disc displacement, lumbar region: Secondary | ICD-10-CM | POA: Insufficient documentation

## 2013-03-31 NOTE — Telephone Encounter (Signed)
I tried to call the patient, cannot leave a message. The patient wants to know whether his possible exposure in 1968 in 1969 in [place] to agent orange to be the cause of his peripheral neuropathy. The patient began having symptoms in 2007 following surgery on his bladder. Blood work evaluation for his peripheral neuropathy included an immunoelectrophoresis study that revealed evidence of an IgG monoclonal antibody. This MGUS is the likely source of his peripheral neuropathy. I cannot say that the agent orange exposure in 1968 in 1969 is the etiology of his current peripheral neuropathy.

## 2013-04-02 ENCOUNTER — Other Ambulatory Visit: Payer: Self-pay | Admitting: Neurosurgery

## 2013-05-04 ENCOUNTER — Encounter (HOSPITAL_COMMUNITY): Payer: Self-pay

## 2013-05-06 ENCOUNTER — Encounter (HOSPITAL_COMMUNITY): Payer: Self-pay

## 2013-05-06 ENCOUNTER — Encounter (HOSPITAL_COMMUNITY)
Admission: RE | Admit: 2013-05-06 | Discharge: 2013-05-06 | Disposition: A | Discharge status: Home / Self Care | Payer: Medicare Other | Source: Ambulatory Visit | Attending: Neurosurgery | Admitting: Neurosurgery

## 2013-05-06 ENCOUNTER — Encounter (HOSPITAL_COMMUNITY)
Admission: RE | Admit: 2013-05-06 | Discharge: 2013-05-06 | Disposition: A | Discharge status: Home / Self Care | Payer: Medicare Other | Source: Ambulatory Visit | Attending: Anesthesiology | Admitting: Anesthesiology

## 2013-05-06 DIAGNOSIS — Z01812 Encounter for preprocedural laboratory examination: Secondary | ICD-10-CM | POA: Insufficient documentation

## 2013-05-06 DIAGNOSIS — Z01818 Encounter for other preprocedural examination: Secondary | ICD-10-CM | POA: Insufficient documentation

## 2013-05-06 HISTORY — DX: Gastro-esophageal reflux disease without esophagitis: K21.9

## 2013-05-06 HISTORY — DX: Personal history of urinary calculi: Z87.442

## 2013-05-06 LAB — CBC
HCT: 42.8 % (ref 39.0–52.0)
Hemoglobin: 14.8 g/dL (ref 13.0–17.0)
MCH: 32.8 pg (ref 26.0–34.0)
MCHC: 34.6 g/dL (ref 30.0–36.0)
MCV: 94.9 fL (ref 78.0–100.0)
Platelets: 176 10*3/uL (ref 150–400)
RBC: 4.51 MIL/uL (ref 4.22–5.81)
RDW: 14.1 % (ref 11.5–15.5)
WBC: 7.3 10*3/uL (ref 4.0–10.5)

## 2013-05-06 LAB — BASIC METABOLIC PANEL
BUN: 11 mg/dL (ref 6–23)
CO2: 31 mEq/L (ref 19–32)
Calcium: 9.2 mg/dL (ref 8.4–10.5)
Chloride: 100 mEq/L (ref 96–112)
Creatinine, Ser: 1.31 mg/dL (ref 0.50–1.35)
GFR calc Af Amer: 62 mL/min — ABNORMAL LOW (ref >90)
GFR calc non Af Amer: 53 mL/min — ABNORMAL LOW (ref >90)
Glucose, Bld: 109 mg/dL — ABNORMAL HIGH (ref 70–99)
Potassium: 3.7 mEq/L (ref 3.7–5.3)
Sodium: 142 mEq/L (ref 137–147)

## 2013-05-06 LAB — SURGICAL PCR SCREEN
MRSA, PCR: NEGATIVE
Staphylococcus aureus: NEGATIVE

## 2013-05-06 NOTE — Progress Notes (Signed)
05/06/13 1106  OBSTRUCTIVE SLEEP APNEA  Have you ever been diagnosed with sleep apnea through a sleep study? No  Do you snore loudly (loud enough to be heard through closed doors)?  0  Do you often feel tired, fatigued, or sleepy during the daytime? 0  Has anyone observed you stop breathing during your sleep? 0  Do you have, or are you being treated for high blood pressure? 1  BMI more than 35 kg/m2? 0  Age over 72 years old? 1  Neck circumference greater than 40 cm/18 inches? 1 (20)  Gender: 1  Obstructive Sleep Apnea Score 4  Score 4 or greater  Results sent to PCP

## 2013-05-06 NOTE — Pre-Procedure Instructions (Signed)
PRATT BRESS  05/06/2013   Your procedure is scheduled on:  05/15/13  Report to [facility] short stay admitting  at 44AM.  Call this number if you have problems the morning of surgery: [phone]   Remember:   Do not eat food or drink liquids after midnight.   Take these medicines the morning of surgery with A SIP OF WATER: inhalers,nebulizer, pain med, gabapentin, flomax,tenoretic       STOP all herbel meds, nsaids (aleve,naproxen,advil,ibuprofen) 5 days prior to surgery including vitamins ,asa   Do not wear jewelry, make-up or nail polish.  Do not wear lotions, powders, or perfumes. You may wear deodorant.  Do not shave 48 hours prior to surgery. Men may shave face and neck.  Do not bring valuables to the hospital.  [facility] is not responsible                  for any belongings or valuables.               Contacts, dentures or bridgework may not be worn into surgery.  Leave suitcase in the car. After surgery it may be brought to your room.  For patients admitted to the hospital, discharge time is determined by your                treatment team.               Patients discharged the day of surgery will not be allowed to drive  home.  Name and phone number of your driver:   Special Instructions:  Special Instructions: [place] - Preparing for Surgery  Before surgery, you can play an important role.  Because skin is not sterile, your skin needs to be as free of germs as possible.  You can reduce the number of germs on you skin by washing with CHG (chlorahexidine gluconate) soap before surgery.  CHG is an antiseptic cleaner which kills germs and bonds with the skin to continue killing germs even after washing.  Please DO NOT use if you have an allergy to CHG or antibacterial soaps.  If your skin becomes reddened/irritated stop using the CHG and inform your nurse when you arrive at Short Stay.  Do not shave (including legs and underarms) for at least 48 hours prior to the  first CHG shower.  You may shave your face.  Please follow these instructions carefully:   1.  Shower with CHG Soap the night before surgery and the morning of Surgery.  2.  If you choose to wash your hair, wash your hair first as usual with your normal shampoo.  3.  After you shampoo, rinse your hair and body thoroughly to remove the Shampoo.  4.  Use CHG as you would any other liquid soap.  You can apply chg directly  to the skin and wash gently with scrungie or a clean washcloth.  5.  Apply the CHG Soap to your body ONLY FROM THE NECK DOWN.  Do not use on open wounds or open sores.  Avoid contact with your eyes ears, mouth and genitals (private parts).  Wash genitals (private parts)       with your normal soap.  6.  Wash thoroughly, paying special attention to the area where your surgery will be performed.  7.  Thoroughly rinse your body with warm water from the neck down.  8.  DO NOT shower/wash with your normal soap after using and rinsing off the CHG  Soap.  9.  Pat yourself dry with a clean towel.            10.  Wear clean pajamas.            11.  Place clean sheets on your bed the night of your first shower and do not sleep with pets.  Day of Surgery  Do not apply any lotions/deodorants the morning of surgery.  Please wear clean clothes to the hospital/surgery center.   Please read over the following fact sheets that you were given: Pain Booklet, Coughing and Deep Breathing, MRSA Information and Surgical Site Infection Prevention

## 2013-05-14 MED ORDER — DEXTROSE 5 % IV SOLN
3.0000 g | INTRAVENOUS | Status: DC
  Filled 2013-05-14: qty 3000

## 2013-05-15 ENCOUNTER — Encounter (HOSPITAL_COMMUNITY): Payer: Self-pay | Admitting: Anesthesiology

## 2013-05-15 ENCOUNTER — Ambulatory Visit (HOSPITAL_COMMUNITY)
Admission: RE | Admit: 2013-05-15 | Discharge: 2013-05-15 | Disposition: A | Discharge status: Home / Self Care | Payer: Medicare Other | Source: Ambulatory Visit | Attending: Neurosurgery | Admitting: Neurosurgery

## 2013-05-15 ENCOUNTER — Encounter (HOSPITAL_COMMUNITY)
Admission: RE | Disposition: A | Discharge status: Home / Self Care | Payer: Self-pay | Source: Ambulatory Visit | Attending: Neurosurgery

## 2013-05-15 ENCOUNTER — Ambulatory Visit (HOSPITAL_COMMUNITY): Payer: Medicare Other | Admitting: Anesthesiology

## 2013-05-15 ENCOUNTER — Encounter (HOSPITAL_COMMUNITY): Payer: Medicare Other | Admitting: Anesthesiology

## 2013-05-15 DIAGNOSIS — Z538 Procedure and treatment not carried out for other reasons: Secondary | ICD-10-CM | POA: Insufficient documentation

## 2013-05-15 DIAGNOSIS — M109 Gout, unspecified: Secondary | ICD-10-CM | POA: Insufficient documentation

## 2013-05-15 DIAGNOSIS — K219 Gastro-esophageal reflux disease without esophagitis: Secondary | ICD-10-CM | POA: Insufficient documentation

## 2013-05-15 DIAGNOSIS — M5126 Other intervertebral disc displacement, lumbar region: Secondary | ICD-10-CM | POA: Insufficient documentation

## 2013-05-15 DIAGNOSIS — E119 Type 2 diabetes mellitus without complications: Secondary | ICD-10-CM | POA: Insufficient documentation

## 2013-05-15 DIAGNOSIS — I10 Essential (primary) hypertension: Secondary | ICD-10-CM | POA: Insufficient documentation

## 2013-05-15 DIAGNOSIS — F17200 Nicotine dependence, unspecified, uncomplicated: Secondary | ICD-10-CM | POA: Insufficient documentation

## 2013-05-15 DIAGNOSIS — J4489 Other specified chronic obstructive pulmonary disease: Secondary | ICD-10-CM | POA: Insufficient documentation

## 2013-05-15 DIAGNOSIS — F3289 Other specified depressive episodes: Secondary | ICD-10-CM | POA: Insufficient documentation

## 2013-05-15 DIAGNOSIS — J449 Chronic obstructive pulmonary disease, unspecified: Secondary | ICD-10-CM | POA: Insufficient documentation

## 2013-05-15 DIAGNOSIS — F329 Major depressive disorder, single episode, unspecified: Secondary | ICD-10-CM | POA: Insufficient documentation

## 2013-05-15 LAB — GLUCOSE, CAPILLARY: Glucose-Capillary: 130 mg/dL — ABNORMAL HIGH (ref 70–99)

## 2013-05-15 SURGERY — CANCELLED PROCEDURE

## 2013-05-15 MED ORDER — PROPOFOL 10 MG/ML IV BOLUS
INTRAVENOUS | Status: AC
  Filled 2013-05-15: qty 20

## 2013-05-15 MED ORDER — ROCURONIUM BROMIDE 50 MG/5ML IV SOLN
INTRAVENOUS | Status: AC
  Filled 2013-05-15: qty 2

## 2013-05-15 MED ORDER — FENTANYL CITRATE 0.05 MG/ML IJ SOLN
INTRAMUSCULAR | Status: AC
  Filled 2013-05-15: qty 5

## 2013-05-15 MED ORDER — ONDANSETRON HCL 4 MG/2ML IJ SOLN
INTRAMUSCULAR | Status: AC
  Filled 2013-05-15: qty 2

## 2013-05-15 MED ORDER — LIDOCAINE HCL (CARDIAC) 20 MG/ML IV SOLN
INTRAVENOUS | Status: AC
  Filled 2013-05-15: qty 5

## 2013-05-15 MED ORDER — LACTATED RINGERS IV SOLN
INTRAVENOUS | Status: DC | PRN
  Administered 2013-05-15 – 2017-09-05 (×8): via INTRAVENOUS

## 2013-05-15 MED ORDER — SUCCINYLCHOLINE CHLORIDE 20 MG/ML IJ SOLN
INTRAMUSCULAR | Status: AC
  Filled 2013-05-15: qty 1

## 2013-05-15 MED ORDER — MIDAZOLAM HCL 2 MG/2ML IJ SOLN
INTRAMUSCULAR | Status: AC
  Filled 2013-05-15: qty 2

## 2013-05-15 SURGICAL SUPPLY — 50 items
ADH SKN CLS APL DERMABOND .7 (GAUZE/BANDAGES/DRESSINGS) ×1
APL SKNCLS STERI-STRIP NONHPOA (GAUZE/BANDAGES/DRESSINGS)
BAG DECANTER FOR FLEXI CONT (MISCELLANEOUS) ×1 IMPLANT
BENZOIN TINCTURE PRP APPL 2/3 (GAUZE/BANDAGES/DRESSINGS) IMPLANT
BLADE SURG ROTATE 9660 (MISCELLANEOUS) IMPLANT
BUR MATCHSTICK NEURO 3.0 LAGG (BURR) ×4 IMPLANT
CANISTER SUCT 3000ML (MISCELLANEOUS) ×4 IMPLANT
CLOSURE WOUND 1/2 X4 (GAUZE/BANDAGES/DRESSINGS)
CONT SPEC 4OZ CLIKSEAL STRL BL (MISCELLANEOUS) ×4 IMPLANT
DECANTER SPIKE VIAL GLASS SM (MISCELLANEOUS) ×4 IMPLANT
DERMABOND ADVANCED (GAUZE/BANDAGES/DRESSINGS) ×2
DERMABOND ADVANCED .7 DNX12 (GAUZE/BANDAGES/DRESSINGS) ×1 IMPLANT
DRAPE LAPAROTOMY 100X72X124 (DRAPES) ×4 IMPLANT
DRAPE MICROSCOPE LEICA (MISCELLANEOUS) ×3 IMPLANT
DRAPE POUCH INSTRU U-SHP 10X18 (DRAPES) ×4 IMPLANT
DRAPE SURG 17X23 STRL (DRAPES) ×4 IMPLANT
DURAPREP 26ML APPLICATOR (WOUND CARE) ×4 IMPLANT
ELECT REM PT RETURN 9FT ADLT (ELECTROSURGICAL) ×3
ELECTRODE REM PT RTRN 9FT ADLT (ELECTROSURGICAL) ×2 IMPLANT
GAUZE SPONGE 4X4 16PLY XRAY LF (GAUZE/BANDAGES/DRESSINGS) IMPLANT
GLOVE ECLIPSE 6.5 STRL STRAW (GLOVE) ×4 IMPLANT
GLOVE EXAM NITRILE LRG STRL (GLOVE) IMPLANT
GLOVE EXAM NITRILE MD LF STRL (GLOVE) IMPLANT
GLOVE EXAM NITRILE XL STR (GLOVE) IMPLANT
GLOVE EXAM NITRILE XS STR PU (GLOVE) IMPLANT
GOWN BRE IMP SLV AUR LG STRL (GOWN DISPOSABLE) ×2 IMPLANT
GOWN BRE IMP SLV AUR XL STRL (GOWN DISPOSABLE) IMPLANT
GOWN STRL REIN 2XL LVL4 (GOWN DISPOSABLE) IMPLANT
KIT BASIN OR (CUSTOM PROCEDURE TRAY) ×4 IMPLANT
KIT ROOM TURNOVER OR (KITS) ×4 IMPLANT
NDL HYPO 25X1 1.5 SAFETY (NEEDLE) ×1 IMPLANT
NDL SPNL 18GX3.5 QUINCKE PK (NEEDLE) IMPLANT
NEEDLE HYPO 25X1 1.5 SAFETY (NEEDLE) ×3 IMPLANT
NEEDLE SPNL 18GX3.5 QUINCKE PK (NEEDLE) IMPLANT
NS IRRIG 1000ML POUR BTL (IV SOLUTION) ×4 IMPLANT
PACK LAMINECTOMY NEURO (CUSTOM PROCEDURE TRAY) ×4 IMPLANT
PAD ARMBOARD 7.5X6 YLW CONV (MISCELLANEOUS) IMPLANT
RUBBERBAND STERILE (MISCELLANEOUS) ×8 IMPLANT
SPONGE GAUZE 4X4 12PLY (GAUZE/BANDAGES/DRESSINGS) IMPLANT
SPONGE LAP 4X18 X RAY DECT (DISPOSABLE) IMPLANT
SPONGE SURGIFOAM ABS GEL SZ50 (HEMOSTASIS) ×4 IMPLANT
STRIP CLOSURE SKIN 1/2X4 (GAUZE/BANDAGES/DRESSINGS) IMPLANT
SUT VIC AB 0 CT1 18XCR BRD8 (SUTURE) ×2 IMPLANT
SUT VIC AB 0 CT1 8-18 (SUTURE) ×3
SUT VIC AB 2-0 CT1 18 (SUTURE) ×4 IMPLANT
SUT VIC AB 3-0 SH 8-18 (SUTURE) ×3 IMPLANT
SYR 20ML ECCENTRIC (SYRINGE) ×4 IMPLANT
TOWEL OR 17X24 6PK STRL BLUE (TOWEL DISPOSABLE) ×4 IMPLANT
TOWEL OR 17X26 10 PK STRL BLUE (TOWEL DISPOSABLE) ×4 IMPLANT
WATER STERILE IRR 1000ML POUR (IV SOLUTION) ×4 IMPLANT

## 2013-05-15 NOTE — H&P (Signed)
BP 114/72  Pulse 79  Temp(Src) 97.7 F (36.5 C) (Oral)  Resp 18  SpO2 98%     [name] presents today for evaluation of pain that he has in his lower back and numbness and pain that he has in the lower extremities.  He has had these problems now for quite some time.  He was told by a neurologist that it was likely that he had some sort of peripheral neuropathy.  This was accounting for the severe pain which he had in his lower extremities and that he also had lumbar stenosis.  He says that the symptoms have been waxing and waning over time, but over time they do seem to be worse.  He recently had dental surgery and had some slight problems with that, but overall he is here because of this pain that he has in the lower extremities along with the severe foot pain.  He has had no bowel dysfunction.  His bladder problems are something which are very longstanding.   PAST MEDICAL HISTORY:            Current Medical Conditions:  [name] has a long history of renal disease, kidney stones, and bladder problems.  He also has a history of gout, neuropathy, and depression.            Medications and Allergies:  Medications are pantoprazole, tamsulosin, atenolol and chlorthalidone, ipratropium bromide, prednisone 5 mg as needed, potassium chloride, and albuterol.   SOCIAL HISTORY:                                            He does smoke less than one pack of cigarettes a day, does not use illicit drugs.  He currently is widowed, lives at home by himself.   PHYSICAL EXAMINATION:                                On examination, he is alert and oriented x4 and answering all questions appropriately.  Memory, language, attention span, and fund of knowledge are normal.  Well kempt and in no real distress.  I have seen 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  His strength is full.  Gait is antalgic,  but mildly so.  Muscle tone, bulk, and coordination is normal.  Reflexes elicited 2+ at the biceps, triceps, brachioradialis; not elicited at the knees or ankles.  He has intact light touch.  He does have intact proprioception in the upper extremities and mildly decreased at the great toe bilaterally in the lower extremities.       Mr. Pro returns today.  I went over the blood test results as I looked at his Epic chart and these are the tests that Dr. Jannifer Franklin has sent.  No etiology truly could be identified there.  As of now, the only etiology I have for his peripheral neuropathy is Agent Orange and the exposure to that substance.   IMPRESSION/PLAN:                             [name] also has a far lateral herniated disc at L4-5 on the left side.  He states that most of his pain is not all of his discomfort is in his left upper leg and into the left lower extremity.  Given that he would like to proceed with a discectomy at L4-5 on the left side.  Certainly, it is reasonable.  But it is a great pain, so I assured him that it certainly cannot be guarantee offered for pain relief.  I think he will get better, certainly he has impingement on the L4 root, which he does never know how much relief someone will get, but considering the amount of pain that he is in and how long he has endure it, he at this point feels that this is his only reasonable alternative.  Pathology certainly evident, I have no objections whatsoever to proceeding with the operation.  Bleeding, infection, no relief, need for further surgery, and disc recurrence were discussed.  He understands.  I also gave him the detailed instruction sheet and [name] would like for this to proceed sometime in February to mid February.  The reason for this is that he has been taking Lyrica for the peripheral neuropathy, which is now documented and he says that is working significantly better than the Neurontin did.  He is taking pain pills, but  truly wants to be able to get off the pain pills.  He was given Percocet 10 and this is not something he feels comfortable doing.  Since there was an etiology for the pain, again he would like to proceed with operative decompression.

## 2013-05-15 NOTE — Progress Notes (Signed)
Surgery cancelled Dr. Christella Noa. Pt. Dressed self & left ambulatory

## 2013-05-15 NOTE — Anesthesia Preprocedure Evaluation (Signed)
Anesthesia Evaluation    Reviewed: Allergy & Precautions, H&P , NPO status , Patient's Chart, lab work & pertinent test results  Airway       Dental   Pulmonary COPD COPD inhaler, Current Smoker,          Cardiovascular hypertension, Pt. on home beta blockers     Neuro/Psych PSYCHIATRIC DISORDERS Depression    GI/Hepatic GERD-  ,  Endo/Other  diabetes  Renal/GU      Musculoskeletal   Abdominal   Peds  Hematology   Anesthesia Other Findings   Reproductive/Obstetrics                           Anesthesia Physical Anesthesia Plan  ASA: III  Anesthesia Plan: General   Post-op Pain Management:    Induction: Intravenous  Airway Management Planned: Oral ETT  Additional Equipment:   Intra-op Plan:   Post-operative Plan: Extubation in OR  Informed Consent:   Plan Discussed with: CRNA, Anesthesiologist and Surgeon  Anesthesia Plan Comments:         Anesthesia Quick Evaluation

## 2013-06-03 ENCOUNTER — Other Ambulatory Visit: Payer: Self-pay | Admitting: Neurosurgery

## 2013-06-03 DIAGNOSIS — M5136 Other intervertebral disc degeneration, lumbar region: Secondary | ICD-10-CM

## 2013-08-22 ENCOUNTER — Encounter (HOSPITAL_COMMUNITY): Payer: Self-pay | Admitting: Emergency Medicine

## 2013-08-22 ENCOUNTER — Emergency Department (HOSPITAL_COMMUNITY)
Admission: EM | Admit: 2013-08-22 | Discharge: 2013-08-22 | Disposition: A | Discharge status: Home / Self Care | Payer: Medicare Other | Attending: Emergency Medicine | Admitting: Emergency Medicine

## 2013-08-22 ENCOUNTER — Emergency Department (HOSPITAL_COMMUNITY): Payer: Medicare Other

## 2013-08-22 DIAGNOSIS — Z791 Long term (current) use of non-steroidal anti-inflammatories (NSAID): Secondary | ICD-10-CM | POA: Insufficient documentation

## 2013-08-22 DIAGNOSIS — Z87442 Personal history of urinary calculi: Secondary | ICD-10-CM | POA: Insufficient documentation

## 2013-08-22 DIAGNOSIS — IMO0002 Reserved for concepts with insufficient information to code with codable children: Secondary | ICD-10-CM | POA: Insufficient documentation

## 2013-08-22 DIAGNOSIS — J4489 Other specified chronic obstructive pulmonary disease: Secondary | ICD-10-CM | POA: Insufficient documentation

## 2013-08-22 DIAGNOSIS — R109 Unspecified abdominal pain: Secondary | ICD-10-CM | POA: Insufficient documentation

## 2013-08-22 DIAGNOSIS — J449 Chronic obstructive pulmonary disease, unspecified: Secondary | ICD-10-CM | POA: Insufficient documentation

## 2013-08-22 DIAGNOSIS — E785 Hyperlipidemia, unspecified: Secondary | ICD-10-CM | POA: Insufficient documentation

## 2013-08-22 DIAGNOSIS — Z79899 Other long term (current) drug therapy: Secondary | ICD-10-CM | POA: Insufficient documentation

## 2013-08-22 DIAGNOSIS — I10 Essential (primary) hypertension: Secondary | ICD-10-CM | POA: Insufficient documentation

## 2013-08-22 DIAGNOSIS — N529 Male erectile dysfunction, unspecified: Secondary | ICD-10-CM | POA: Insufficient documentation

## 2013-08-22 DIAGNOSIS — E669 Obesity, unspecified: Secondary | ICD-10-CM | POA: Insufficient documentation

## 2013-08-22 DIAGNOSIS — M79609 Pain in unspecified limb: Secondary | ICD-10-CM

## 2013-08-22 DIAGNOSIS — M79662 Pain in left lower leg: Secondary | ICD-10-CM

## 2013-08-22 DIAGNOSIS — Z8719 Personal history of other diseases of the digestive system: Secondary | ICD-10-CM | POA: Insufficient documentation

## 2013-08-22 DIAGNOSIS — F17200 Nicotine dependence, unspecified, uncomplicated: Secondary | ICD-10-CM | POA: Insufficient documentation

## 2013-08-22 DIAGNOSIS — N390 Urinary tract infection, site not specified: Secondary | ICD-10-CM | POA: Insufficient documentation

## 2013-08-22 DIAGNOSIS — Z8701 Personal history of pneumonia (recurrent): Secondary | ICD-10-CM | POA: Insufficient documentation

## 2013-08-22 DIAGNOSIS — M129 Arthropathy, unspecified: Secondary | ICD-10-CM | POA: Insufficient documentation

## 2013-08-22 LAB — URINALYSIS, ROUTINE W REFLEX MICROSCOPIC
Bilirubin Urine: NEGATIVE
Glucose, UA: NEGATIVE mg/dL
Hgb urine dipstick: NEGATIVE
Ketones, ur: NEGATIVE mg/dL
Nitrite: POSITIVE — AB
Protein, ur: NEGATIVE mg/dL
Specific Gravity, Urine: 1.016 (ref 1.005–1.030)
Urobilinogen, UA: 1 mg/dL (ref 0.0–1.0)
pH: 7.5 (ref 5.0–8.0)

## 2013-08-22 LAB — CBC WITH DIFFERENTIAL/PLATELET
Basophils Absolute: 0 10*3/uL (ref 0.0–0.1)
Basophils Relative: 0 % (ref 0–1)
Eosinophils Absolute: 0.3 10*3/uL (ref 0.0–0.7)
Eosinophils Relative: 3 % (ref 0–5)
HCT: 40.4 % (ref 39.0–52.0)
Hemoglobin: 14.2 g/dL (ref 13.0–17.0)
Lymphocytes Relative: 24 % (ref 12–46)
Lymphs Abs: 1.9 10*3/uL (ref 0.7–4.0)
MCH: 33.4 pg (ref 26.0–34.0)
MCHC: 35.1 g/dL (ref 30.0–36.0)
MCV: 95.1 fL (ref 78.0–100.0)
Monocytes Absolute: 0.6 10*3/uL (ref 0.1–1.0)
Monocytes Relative: 7 % (ref 3–12)
Neutro Abs: 5.1 10*3/uL (ref 1.7–7.7)
Neutrophils Relative %: 66 % (ref 43–77)
Platelets: 153 10*3/uL (ref 150–400)
RBC: 4.25 MIL/uL (ref 4.22–5.81)
RDW: 13.9 % (ref 11.5–15.5)
WBC: 7.9 10*3/uL (ref 4.0–10.5)

## 2013-08-22 LAB — URINE MICROSCOPIC-ADD ON

## 2013-08-22 LAB — COMPREHENSIVE METABOLIC PANEL
ALT: 13 U/L (ref 0–53)
AST: 17 U/L (ref 0–37)
Albumin: 3.6 g/dL (ref 3.5–5.2)
Alkaline Phosphatase: 49 U/L (ref 39–117)
BUN: 13 mg/dL (ref 6–23)
CO2: 30 mEq/L (ref 19–32)
Calcium: 9.3 mg/dL (ref 8.4–10.5)
Chloride: 103 mEq/L (ref 96–112)
Creatinine, Ser: 1.33 mg/dL (ref 0.50–1.35)
GFR calc Af Amer: 60 mL/min — ABNORMAL LOW (ref >90)
GFR calc non Af Amer: 52 mL/min — ABNORMAL LOW (ref >90)
Glucose, Bld: 106 mg/dL — ABNORMAL HIGH (ref 70–99)
Potassium: 4.4 mEq/L (ref 3.7–5.3)
Sodium: 143 mEq/L (ref 137–147)
Total Bilirubin: 0.4 mg/dL (ref 0.3–1.2)
Total Protein: 7.6 g/dL (ref 6.0–8.3)

## 2013-08-22 MED ORDER — MORPHINE SULFATE 4 MG/ML IJ SOLN
4.0000 mg | Freq: Once | INTRAMUSCULAR | Status: AC
  Administered 2013-08-22: 4 mg via INTRAVENOUS
  Filled 2013-08-22: qty 1

## 2013-08-22 MED ORDER — CEFTRIAXONE SODIUM 1 G IJ SOLR: 1.0000 g | Freq: Once | INTRAMUSCULAR | Status: DC

## 2013-08-22 MED ORDER — DEXTROSE 5 % IV SOLN
1.0000 g | Freq: Once | INTRAVENOUS | Status: AC
  Administered 2013-08-22: 1 g via INTRAVENOUS
  Filled 2013-08-22: qty 10

## 2013-08-22 MED ORDER — IOHEXOL 300 MG/ML  SOLN
100.0000 mL | Freq: Once | INTRAMUSCULAR | Status: AC | PRN
  Administered 2013-08-22: 100 mL via INTRAVENOUS

## 2013-08-22 MED ORDER — CEPHALEXIN 500 MG PO CAPS: 500.0000 mg | ORAL_CAPSULE | Freq: Two times a day (BID) | ORAL | Status: DC

## 2013-08-22 MED ORDER — SODIUM CHLORIDE 0.9 % IV SOLN
Freq: Once | INTRAVENOUS | Status: AC
  Administered 2013-08-22: 07:00:00 via INTRAVENOUS

## 2013-08-22 MED ORDER — IOHEXOL 300 MG/ML  SOLN
25.0000 mL | INTRAMUSCULAR | Status: AC | PRN
  Administered 2013-08-22 (×2): 25 mL via ORAL

## 2013-08-22 NOTE — ED Notes (Signed)
Patient transported to CT A&Ox3; no signs of distress.

## 2013-08-22 NOTE — Discharge Instructions (Signed)
Call for a follow up appointment with a Family or Primary Care Provider for further evaluation of your left calf pain and possible repeat ultrasound of your leg in 1 week.  Call for an appointment with a urologist for further evaluation of your urinary tract infection. Return if Symptoms worsen.   Take medication as prescribed.    Metformin and X-ray Contrast Studies For some X-ray exams, a contrast dye is used. Contrast dye is a type of medicine used to make the X-ray image clearer. The contrast dye is given to the patient through a vein (intravenously). If you need to have this type of X-ray exam and you take a medication called metformin, your caregiver may have you stop taking metformin before the exam.  LACTIC ACIDOSIS In rare cases, a serious medical condition called lactic acidosis can develop in people who take metformin and receive contrast dye. The following conditions can increase the risk of this complication:   Kidney failure.  Liver problems.  Certain types of heart problems such as:  Heart failure.  Heart attack.  Heart infection.  Heart valve problems.  Alcohol abuse. If left untreated, lactic acidosis can lead to coma.  SYMPTOMS OF LACTIC ACIDOSIS Symptoms of lactic acidosis can include:  Rapid breathing (hyperventilation).  Neurologic symptoms such as:  Headaches.  Confusion.  Dizziness.  Excessive sweating.  Feeling sick to your stomach (nauseous) or throwing up (vomiting). AFTER THE X-RAY EXAM  Stay well-hydrated. Drink fluids as instructed by your caregiver.  If you have a risk of developing lactic acidosis, blood tests may be done to make sure your kidney function is okay.  Metformin is usually stopped for 48 hours after the X-ray exam. Ask your caregiver when you can start taking metformin again. SEEK MEDICAL CARE IF:   You have shortness of breath or difficulty breathing.  You develop a headache that does not go away.  You have nausea or  vomiting.  You urinate more than normal.  You develop a skin rash and have:  Redness.  Swelling.  Itching. Document Released: 02/28/2009 Document Revised: 06/04/2011 Document Reviewed: 02/28/2009 [facility] Patient Information 2014 [place], [place].

## 2013-08-22 NOTE — ED Notes (Addendum)
Presents with left calf pain that began tonight and is associated with foot swelling.  He states he came to the ER tonight for left calf pain, both ankles are swollen. He is alert. No swelling to calf, no redness. Pain is worse with movement. Pt has multiple complaitns and states he has been dizzy for 4 years.

## 2013-08-22 NOTE — ED Notes (Signed)
Pt remains in imaging.

## 2013-08-22 NOTE — ED Provider Notes (Signed)
CSN: [id number]     Arrival date & time 08/22/13  0122 History   First MD Initiated Contact with Patient 08/22/13 [phone]     Chief Complaint  Patient presents with  . Leg Pain     (Consider location/radiation/quality/duration/timing/severity/associated sxs/prior Treatment) HPI Comments: The patient is a 73 year old with a PMH of HTN, Previous urologic procedures with recurrent cystitis, Tobacco Abuse, Gout presenting for left calf pain that started yesterday.  The patient reports a "knot" in his left calf and. He reports chronic bilateral edema has unchanged. He denies recent injury to leg.   He also complains of lower abdominal discomfort while waiting in emergency department. Denies nausea, vomiting. He reports history of constipation, last bowel movement was 2-3 days ago. Patient states at baseline he has a bowel movement every 3 days.  Denies bright red blood per rectum. Reports last colonoscopy within a year.  The patient complains of dysuria, similar to previous UTI.  PCP: [facility] Celedonio Savage, MD  The history is provided by the patient. No language interpreter was used.    Past Medical History  Diagnosis Date  . Nicotine addiction   . Obesity   . Dyslipidemia   . Hypertension   . Elevated WBCs September 2011    and UTI Hospitalized   . Erectile dysfunction   . Arthritis   . Polyneuropathy in other diseases classified elsewhere 12/05/2012  . Lesion of ulnar nerve 12/05/2012    Right side  . Gait disorder   . COPD (chronic obstructive pulmonary disease)     normally have breathing tx prior to surgery  . Pneumonia     hx  . History of kidney stones   . GERD (gastroesophageal reflux disease)   . Neuropathy   . Vertigo   . Diabetes mellitus without complication     bordrline no med   Past Surgical History  Procedure Laterality Date  . Bladder surgery  02/2011    For Diverticulum of bladder removed  . Colon polyp resection    . Cysto     Family History  Problem Relation Age  of Onset  . Cancer Father    History  Substance Use Topics  . Smoking status: Current Every Day Smoker -- 0.50 packs/day for 35 years    Types: Cigarettes  . Smokeless tobacco: Never Used  . Alcohol Use: No    Review of Systems  Constitutional: Negative for fever and chills.  Respiratory: Negative for shortness of breath.   Cardiovascular: Negative for chest pain.  Gastrointestinal: Positive for abdominal pain and constipation. Negative for nausea, vomiting, diarrhea, blood in stool and anal bleeding.  Genitourinary: Positive for dysuria. Negative for hematuria.  Skin: Negative for color change and wound.      Allergies  Ciprofloxacin and Levaquin  Home Medications   Prior to Admission medications   Medication Sig Start Date End Date Taking? Authorizing Provider  atenolol-chlorthalidone (TENORETIC) 50-25 MG per tablet Take 1 tablet by mouth at bedtime.     Historical Provider, MD  Fluticasone-Salmeterol (ADVAIR DISKUS) 100-50 MCG/DOSE AEPB Inhale 1 puff into the lungs as needed (shortness of breath).     Historical Provider, MD  gabapentin (NEURONTIN) 400 MG capsule Take 400 mg by mouth daily.    Historical Provider, MD  HYDROcodone-acetaminophen (NORCO/VICODIN) 5-325 MG per tablet Take 1 tablet by mouth every 6 (six) hours as needed for moderate pain.    Historical Provider, MD  ipratropium-albuterol (DUONEB) 0.5-2.5 (3) MG/3ML SOLN Take 3 mLs by nebulization  3 (three) times daily as needed (wheezing).     Historical Provider, MD  naproxen (NAPROSYN) 500 MG tablet Take 500 mg by mouth daily as needed (pain).    Historical Provider, MD  Nebulizers (NEBULIZER COMPRESSOR) MISC by Does not apply route. UAD     Historical Provider, MD  polyethylene glycol (MIRALAX / GLYCOLAX) packet Take 17 g by mouth daily as needed for mild constipation.    Historical Provider, MD  predniSONE (DELTASONE) 10 MG tablet Take 20 mg by mouth daily.     Historical Provider, MD  pregabalin (LYRICA) 150  MG capsule Take 150 mg by mouth at bedtime.    Historical Provider, MD  sildenafil (VIAGRA) 100 MG tablet Take 100 mg by mouth daily as needed for erectile dysfunction. One tablet 30 minutes before intercourse, maximum twice weekly    Historical Provider, MD  tamsulosin (FLOMAX) 0.4 MG CAPS capsule Take 0.4 mg by mouth 2 (two) times daily.    Historical Provider, MD  tiotropium (SPIRIVA) 18 MCG inhalation capsule Place 18 mcg into inhaler and inhale daily.      Historical Provider, MD   BP 115/70  Pulse 61  Temp(Src) 97.7 F (36.5 C) (Oral)  Resp 18  SpO2 97% Physical Exam  Nursing note and vitals reviewed. Constitutional: He is oriented to person, place, and time. He appears well-developed and well-nourished.  Non-toxic appearance. He does not have a sickly appearance. He does not appear ill. No distress.  HENT:  Head: Normocephalic and atraumatic.  Eyes: EOM are normal. Pupils are equal, round, and reactive to light. No scleral icterus.  Neck: Neck supple.  Cardiovascular: Normal rate, regular rhythm and normal heart sounds.   1+ pitting edema to lower extremities, R>L. No calf tenderness on exam.  No palpable mass behind knee.  Pulmonary/Chest: Effort normal and breath sounds normal. He has no wheezes.  Abdominal: Soft. Bowel sounds are normal. He exhibits no mass. There is tenderness in the right lower quadrant, suprapubic area and left lower quadrant. There is no rigidity, no rebound and no guarding.  Mild lower extremity tenderness with palpation. Well healed suprapubic midline scar.  Musculoskeletal: Normal range of motion. He exhibits no edema.       Left knee: He exhibits normal range of motion.       Left ankle: He exhibits normal range of motion.       Left lower leg: He exhibits no tenderness and no deformity.  No spasm noted  Neurological: He is alert and oriented to person, place, and time.  Skin: Skin is warm and dry. No rash noted. He is not diaphoretic.  Psychiatric: He  has a normal mood and affect.    ED Course  Procedures (including critical care time) Labs Review Results for orders placed during the hospital encounter of 08/22/13  CBC WITH DIFFERENTIAL      Result Value Ref Range   WBC 7.9  4.0 - 10.5 K/uL   RBC 4.25  4.22 - 5.81 MIL/uL   Hemoglobin 14.2  13.0 - 17.0 g/dL   HCT 40.4  39.0 - 52.0 %   MCV 95.1  78.0 - 100.0 fL   MCH 33.4  26.0 - 34.0 pg   MCHC 35.1  30.0 - 36.0 g/dL   RDW 13.9  11.5 - 15.5 %   Platelets 153  150 - 400 K/uL   Neutrophils Relative % 66  43 - 77 %   Neutro Abs 5.1  1.7 - 7.7 K/uL  Lymphocytes Relative 24  12 - 46 %   Lymphs Abs 1.9  0.7 - 4.0 K/uL   Monocytes Relative 7  3 - 12 %   Monocytes Absolute 0.6  0.1 - 1.0 K/uL   Eosinophils Relative 3  0 - 5 %   Eosinophils Absolute 0.3  0.0 - 0.7 K/uL   Basophils Relative 0  0 - 1 %   Basophils Absolute 0.0  0.0 - 0.1 K/uL  COMPREHENSIVE METABOLIC PANEL      Result Value Ref Range   Sodium 143  137 - 147 mEq/L   Potassium 4.4  3.7 - 5.3 mEq/L   Chloride 103  96 - 112 mEq/L   CO2 30  19 - 32 mEq/L   Glucose, Bld 106 (*) 70 - 99 mg/dL   BUN 13  6 - 23 mg/dL   Creatinine, Ser 1.33  0.50 - 1.35 mg/dL   Calcium 9.3  8.4 - 10.5 mg/dL   Total Protein 7.6  6.0 - 8.3 g/dL   Albumin 3.6  3.5 - 5.2 g/dL   AST 17  0 - 37 U/L   ALT 13  0 - 53 U/L   Alkaline Phosphatase 49  39 - 117 U/L   Total Bilirubin 0.4  0.3 - 1.2 mg/dL   GFR calc non Af Amer 52 (*) >90 mL/min   GFR calc Af Amer 60 (*) >90 mL/min  URINALYSIS, ROUTINE W REFLEX MICROSCOPIC      Result Value Ref Range   Color, Urine YELLOW  YELLOW   APPearance CLEAR  CLEAR   Specific Gravity, Urine 1.016  1.005 - 1.030   pH 7.5  5.0 - 8.0   Glucose, UA NEGATIVE  NEGATIVE mg/dL   Hgb urine dipstick NEGATIVE  NEGATIVE   Bilirubin Urine NEGATIVE  NEGATIVE   Ketones, ur NEGATIVE  NEGATIVE mg/dL   Protein, ur NEGATIVE  NEGATIVE mg/dL   Urobilinogen, UA 1.0  0.0 - 1.0 mg/dL   Nitrite POSITIVE (*) NEGATIVE    Leukocytes, UA SMALL (*) NEGATIVE  URINE MICROSCOPIC-ADD ON      Result Value Ref Range   WBC, UA 11-20  <3 WBC/hpf   Bacteria, UA FEW (*) RARE   Ct Abdomen Pelvis W Contrast  08/22/2013   CLINICAL DATA:  Abdominal pain for 6 years, back pain, history of stone surgery  EXAM: CT ABDOMEN AND PELVIS WITH CONTRAST  TECHNIQUE: Multidetector CT imaging of the abdomen and pelvis was performed using the standard protocol following bolus administration of intravenous contrast.  CONTRAST:  160mL OMNIPAQUE IOHEXOL 300 MG/ML  SOLN  COMPARISON:  CT abdomen pelvis -06/04/2012  FINDINGS: Normal hepatic contour. There is an ill-defined approximately 1.3 cm hypo attenuating lesion within the dome of the right lobe of the liver (image 19, series 2) as well as a less well-defined subcentimeter hypo attenuating lesion within the anterior aspect of the right lobe of the liver (image 19, series 2) which are both suboptimally evaluated due to patient respiratory artifact though appear morphologically unchanged since the 2010 examination and thus favored to represent either hyperdense cysts or hepatic hemangiomas. Normal appearance of the gallbladder. No radiopaque gallstones. No intra or extrahepatic ductal dilatation. No ascites.  There is symmetric enhancement and excretion of the bilateral kidneys. Bilateral renal cysts are grossly unchanged with index approximately 2.2 cm hypo attenuating (7 Hounsfield unit) cyst within the superior pole of the right kidney (image 36, series 2) and index approximately 2.3 cm hypoattenuating (4 Hounsfield unit)  cyst within the inferior pole of the left kidney (image 37, series 2). Additional bilateral subcentimeter hypoattenuating renal lesions are too small to accurately characterize. No radiopaque stones on this noncontrast examination. The urinary obstruction or perinephric stranding. Normal appearance of the bilateral adrenal glands, pancreas and spleen.  A minimal amount of enteric contrast  has been ingested and is seen extending to the level of the mid small bowel. Moderate colonic stool burden without evidence of obstruction. The bowel is normal in course and caliber without wall thickening. Normal appearance of the appendix. No pneumoperitoneum, pneumatosis or portal venous gas.  Scattered mixed calcified and noncalcified slightly irregular atherosclerotic plaque within a mildly aneurysmal abdominal aorta measuring approximately 3 cm in greatest oblique axial dimension (image 42, series 2). The major branch vessels of the abdominal aorta appear widely patent on this non CTA examination. A shotty porta hepatis lymph nodes are not enlarged by size criteria. No retroperitoneal, mesenteric, pelvic or inguinal lymphadenopathy.  Calcifications within normal size prostate. No free fluid within the pelvis. Normal appearance of the urinary bladder given degree of distension.  Limited visualization of the lower thorax is degraded secondary to patient respiratory artifact. No discrete focal airspace opacity or pleural effusion. Normal heart size. No pericardial effusion.  No acute or aggressive osseus abnormalities. Mild to moderate multilevel lumbar spine DDD, worse at L3-L4, L4-L5 with disc space height loss, endplate irregularity and sclerosis. There is mild to moderate bilateral facet degenerative change within the lower lumbar spine. Regional soft tissues appear normal.  IMPRESSION: 1. No explanation for patient's chronic abdominal pain. 2. Mixed calcified and noncalcified slightly irregular atherosclerotic plaque within a mildly aneurysmal abdominal aorta measuring approximately 3 cm in diameter. Recommend follow up by [name] in 3 years. This recommendation follows ACR consensus guidelines: White Paper of the ACR Incidental Findings Committee II on Vascular Findings. J Am Coll [id number]; 82:956-213. 3. Mild to moderate multilevel lumbar spine DDD.   Electronically Signed   By: Sandi Mariscal M.D.   On:  08/22/2013 09:43   Vascular Ultrasound  Lower extremity venous duplex has been completed. Preliminary findings: no evidence of DVT or baker's cyst.   MDM   Final diagnoses:  UTI (lower urinary tract infection)  Pain of left calf   Patient complaining of left calf pain and swelling. Resolved prior to evaluation. Also complains of lower abdominal discomfort mild tenderness palpation. UA shows infection, culture sent PT without correlating cause of constipation or abdominal pain. Duplex negative for DVT. Will discharge home with antibiotics for UTI and urology followup as well as followup with PCP. Dr. Vanita Panda also evaluated the patient while in emergency department. Discussed lab results, imaging results, and treatment plan with the patient. Return precautions given. Reports understanding and no other concerns at this time.  Patient is stable for discharge at this time.  Meds given in ED:  Medications  0.9 %  sodium chloride infusion ( Intravenous Stopped 08/22/13 1035)  morphine 4 MG/ML injection 4 mg (4 mg Intravenous Given 08/22/13 0657)  iohexol (OMNIPAQUE) 300 MG/ML solution 25 mL (25 mLs Oral Contrast Given 08/22/13 0710)  cefTRIAXone (ROCEPHIN) 1 g in dextrose 5 % 50 mL IVPB (0 g Intravenous Stopped 08/22/13 1035)  iohexol (OMNIPAQUE) 300 MG/ML solution 100 mL (100 mLs Intravenous Contrast Given 08/22/13 0830)    Discharge Medication List as of 08/22/2013 11:11 AM    START taking these medications   Details  cephALEXin (KEFLEX) 500 MG capsule Take 1 capsule (500 mg total)  by mouth 2 (two) times daily., Starting 08/22/2013, Until Discontinued, Print            Lorrine Kin, PA-C 08/22/13 [phone]

## 2013-08-22 NOTE — Progress Notes (Signed)
*  PRELIMINARY RESULTS* Vascular Ultrasound Lower extremity venous duplex has been completed.  Preliminary findings: no evidence of DVT or baker's cyst.   Landry Mellow, RDMS, RVT  08/22/2013, 10:16 AM

## 2013-08-22 NOTE — ED Notes (Signed)
CT made aware pt done with contrast

## 2013-08-22 NOTE — ED Provider Notes (Signed)
  This was a shared visit with a mid-level provided (NP or PA).  Throughout the patient's course I was available for consultation/collaboration.  I saw the ECG (if appropriate), relevant labs and studies - I agree with the interpretation.  On my exam the patient was in no distress.      Carmin Muskrat, MD 08/22/13 [phone]

## 2013-08-24 LAB — URINE CULTURE: Colony Count: >=100000

## 2013-09-06 ENCOUNTER — Telehealth (HOSPITAL_BASED_OUTPATIENT_CLINIC_OR_DEPARTMENT_OTHER): Payer: Self-pay | Admitting: Emergency Medicine

## 2013-09-06 ENCOUNTER — Encounter (HOSPITAL_COMMUNITY): Payer: Self-pay | Admitting: Emergency Medicine

## 2013-09-06 ENCOUNTER — Emergency Department (HOSPITAL_COMMUNITY)
Admission: EM | Admit: 2013-09-06 | Discharge: 2013-09-06 | Disposition: A | Discharge status: Home / Self Care | Payer: Medicare Other | Attending: Emergency Medicine | Admitting: Emergency Medicine

## 2013-09-06 DIAGNOSIS — Z87442 Personal history of urinary calculi: Secondary | ICD-10-CM | POA: Insufficient documentation

## 2013-09-06 DIAGNOSIS — J4489 Other specified chronic obstructive pulmonary disease: Secondary | ICD-10-CM | POA: Insufficient documentation

## 2013-09-06 DIAGNOSIS — N390 Urinary tract infection, site not specified: Secondary | ICD-10-CM

## 2013-09-06 DIAGNOSIS — E669 Obesity, unspecified: Secondary | ICD-10-CM | POA: Insufficient documentation

## 2013-09-06 DIAGNOSIS — Z87448 Personal history of other diseases of urinary system: Secondary | ICD-10-CM | POA: Insufficient documentation

## 2013-09-06 DIAGNOSIS — I10 Essential (primary) hypertension: Secondary | ICD-10-CM | POA: Insufficient documentation

## 2013-09-06 DIAGNOSIS — Z791 Long term (current) use of non-steroidal anti-inflammatories (NSAID): Secondary | ICD-10-CM | POA: Insufficient documentation

## 2013-09-06 DIAGNOSIS — Z8701 Personal history of pneumonia (recurrent): Secondary | ICD-10-CM | POA: Insufficient documentation

## 2013-09-06 DIAGNOSIS — Z79899 Other long term (current) drug therapy: Secondary | ICD-10-CM | POA: Insufficient documentation

## 2013-09-06 DIAGNOSIS — Z9889 Other specified postprocedural states: Secondary | ICD-10-CM | POA: Insufficient documentation

## 2013-09-06 DIAGNOSIS — Z8669 Personal history of other diseases of the nervous system and sense organs: Secondary | ICD-10-CM | POA: Insufficient documentation

## 2013-09-06 DIAGNOSIS — E785 Hyperlipidemia, unspecified: Secondary | ICD-10-CM | POA: Insufficient documentation

## 2013-09-06 DIAGNOSIS — R609 Edema, unspecified: Secondary | ICD-10-CM | POA: Insufficient documentation

## 2013-09-06 DIAGNOSIS — K219 Gastro-esophageal reflux disease without esophagitis: Secondary | ICD-10-CM | POA: Insufficient documentation

## 2013-09-06 DIAGNOSIS — R5383 Other fatigue: Secondary | ICD-10-CM

## 2013-09-06 DIAGNOSIS — E119 Type 2 diabetes mellitus without complications: Secondary | ICD-10-CM | POA: Insufficient documentation

## 2013-09-06 DIAGNOSIS — F17200 Nicotine dependence, unspecified, uncomplicated: Secondary | ICD-10-CM | POA: Insufficient documentation

## 2013-09-06 DIAGNOSIS — R5381 Other malaise: Secondary | ICD-10-CM | POA: Insufficient documentation

## 2013-09-06 DIAGNOSIS — Z792 Long term (current) use of antibiotics: Secondary | ICD-10-CM | POA: Insufficient documentation

## 2013-09-06 DIAGNOSIS — J449 Chronic obstructive pulmonary disease, unspecified: Secondary | ICD-10-CM | POA: Insufficient documentation

## 2013-09-06 LAB — URINALYSIS, ROUTINE W REFLEX MICROSCOPIC
Bilirubin Urine: NEGATIVE
Glucose, UA: NEGATIVE mg/dL
Hgb urine dipstick: NEGATIVE
Ketones, ur: NEGATIVE mg/dL
Leukocytes, UA: NEGATIVE
Nitrite: NEGATIVE
Protein, ur: NEGATIVE mg/dL
Specific Gravity, Urine: 1.019 (ref 1.005–1.030)
Urobilinogen, UA: 1 mg/dL (ref 0.0–1.0)
pH: 6 (ref 5.0–8.0)

## 2013-09-06 LAB — CBC WITH DIFFERENTIAL/PLATELET
Basophils Absolute: 0 K/uL (ref 0.0–0.1)
Basophils Relative: 0 % (ref 0–1)
Eosinophils Absolute: 0.2 K/uL (ref 0.0–0.7)
Eosinophils Relative: 4 % (ref 0–5)
HCT: 40.9 % (ref 39.0–52.0)
Hemoglobin: 14.3 g/dL (ref 13.0–17.0)
Lymphocytes Relative: 28 % (ref 12–46)
Lymphs Abs: 1.7 K/uL (ref 0.7–4.0)
MCH: 33.1 pg (ref 26.0–34.0)
MCHC: 35 g/dL (ref 30.0–36.0)
MCV: 94.7 fL (ref 78.0–100.0)
Monocytes Absolute: 0.6 K/uL (ref 0.1–1.0)
Monocytes Relative: 9 % (ref 3–12)
Neutro Abs: 3.7 K/uL (ref 1.7–7.7)
Neutrophils Relative %: 60 % (ref 43–77)
Platelets: 152 K/uL (ref 150–400)
RBC: 4.32 MIL/uL (ref 4.22–5.81)
RDW: 13.8 % (ref 11.5–15.5)
WBC: 6.2 K/uL (ref 4.0–10.5)

## 2013-09-06 LAB — COMPREHENSIVE METABOLIC PANEL
ALT: 15 U/L (ref 0–53)
AST: 21 U/L (ref 0–37)
Albumin: 3.5 g/dL (ref 3.5–5.2)
Alkaline Phosphatase: 45 U/L (ref 39–117)
BUN: 14 mg/dL (ref 6–23)
CO2: 29 mEq/L (ref 19–32)
Calcium: 9.3 mg/dL (ref 8.4–10.5)
Chloride: 98 mEq/L (ref 96–112)
Creatinine, Ser: 1.31 mg/dL (ref 0.50–1.35)
GFR calc Af Amer: 62 mL/min — ABNORMAL LOW (ref >90)
GFR calc non Af Amer: 53 mL/min — ABNORMAL LOW (ref >90)
Glucose, Bld: 121 mg/dL — ABNORMAL HIGH (ref 70–99)
Potassium: 3.7 mEq/L (ref 3.7–5.3)
Sodium: 141 mEq/L (ref 137–147)
Total Bilirubin: 0.4 mg/dL (ref 0.3–1.2)
Total Protein: 7.5 g/dL (ref 6.0–8.3)

## 2013-09-06 LAB — LIPASE, BLOOD: Lipase: 44 U/L (ref 11–59)

## 2013-09-06 LAB — LACTIC ACID, PLASMA: Lactic Acid, Venous: 1.8 mmol/L (ref 0.5–2.2)

## 2013-09-06 MED ORDER — NITROFURANTOIN MONOHYD MACRO 100 MG PO CAPS: 100.0000 mg | ORAL_CAPSULE | Freq: Two times a day (BID) | ORAL | Status: DC

## 2013-09-06 NOTE — ED Notes (Signed)
Pt reports being seen on 5/30 for abd pain and fatigue. Treated for UTI and sent home on antibiotics but reports still feeling the same. Still having fatigue, weakness and abd pain. No acute distress noted at triage, airway intact.

## 2013-09-06 NOTE — ED Provider Notes (Signed)
CSN: [id number]     Arrival date & time 09/06/13  4193 History   First MD Initiated Contact with Patient 09/06/13 1122     Chief Complaint  Patient presents with  . Urinary Tract Infection  . Weakness      HPI Pt reports being seen on 5/30 for abd pain and fatigue. Treated for UTI and sent home on antibiotics but reports still feeling the same. Still having fatigue, weakness and abd pain. No acute distress noted at triage, airway intact.  Past Medical History  Diagnosis Date  . Nicotine addiction   . Obesity   . Dyslipidemia   . Hypertension   . Elevated WBCs September 2011    and UTI Hospitalized   . Erectile dysfunction   . Arthritis   . Polyneuropathy in other diseases classified elsewhere 12/05/2012  . Lesion of ulnar nerve 12/05/2012    Right side  . Gait disorder   . COPD (chronic obstructive pulmonary disease)     normally have breathing tx prior to surgery  . Pneumonia     hx  . History of kidney stones   . GERD (gastroesophageal reflux disease)   . Neuropathy   . Vertigo   . Diabetes mellitus without complication     bordrline no med   Past Surgical History  Procedure Laterality Date  . Bladder surgery  02/2011    For Diverticulum of bladder removed  . Colon polyp resection    . Cysto     Family History  Problem Relation Age of Onset  . Cancer Father    History  Substance Use Topics  . Smoking status: Current Every Day Smoker -- 0.50 packs/day for 35 years    Types: Cigarettes  . Smokeless tobacco: Never Used  . Alcohol Use: No    Review of Systems  All other systems reviewed and are negative.     Allergies  Ciprofloxacin and Levaquin  Home Medications   Prior to Admission medications   Medication Sig Start Date End Date Taking? Authorizing Provider  atenolol-chlorthalidone (TENORETIC) 50-25 MG per tablet Take 1 tablet by mouth at bedtime.    Yes Historical Provider, MD  cephALEXin (KEFLEX) 500 MG capsule Take 1 capsule (500 mg total) by  mouth 2 (two) times daily. 08/22/13  Yes Lauren Burnetta Sabin, PA-C  Fluticasone-Salmeterol (ADVAIR DISKUS) 100-50 MCG/DOSE AEPB Inhale 1 puff into the lungs every 6 (six) hours as needed (shortness of breath).    Yes Historical Provider, MD  gabapentin (NEURONTIN) 400 MG capsule Take 400 mg by mouth daily.   Yes Historical Provider, MD  HYDROcodone-acetaminophen (NORCO/VICODIN) 5-325 MG per tablet Take 1 tablet by mouth every 6 (six) hours as needed for moderate pain.   Yes Historical Provider, MD  ipratropium-albuterol (DUONEB) 0.5-2.5 (3) MG/3ML SOLN Take 3 mLs by nebulization 3 (three) times daily as needed (wheezing).    Yes Historical Provider, MD  naproxen (NAPROSYN) 500 MG tablet Take 500 mg by mouth daily as needed (pain).   Yes Historical Provider, MD  nitroGLYCERIN (NITROSTAT) 0.4 MG SL tablet Place 0.4 mg under the tongue every 5 (five) minutes as needed for chest pain.   Yes Historical Provider, MD  pantoprazole (PROTONIX) 40 MG tablet Take 40 mg by mouth daily.   Yes Historical Provider, MD  polyethylene glycol (MIRALAX / GLYCOLAX) packet Take 17 g by mouth daily as needed for mild constipation.   Yes Historical Provider, MD  predniSONE (DELTASONE) 10 MG tablet Take 20 mg by mouth  daily.    Yes Historical Provider, MD  pregabalin (LYRICA) 150 MG capsule Take 150 mg by mouth at bedtime.   Yes Historical Provider, MD  sildenafil (VIAGRA) 100 MG tablet Take 100 mg by mouth daily as needed for erectile dysfunction. One tablet 30 minutes before intercourse, maximum twice weekly   Yes Historical Provider, MD  tamsulosin (FLOMAX) 0.4 MG CAPS capsule Take 0.4 mg by mouth 2 (two) times daily.   Yes Historical Provider, MD  tiotropium (SPIRIVA) 18 MCG inhalation capsule Place 18 mcg into inhaler and inhale daily.     Yes Historical Provider, MD   BP 135/61  Pulse 67  Temp(Src) 98.2 F (36.8 C) (Oral)  Resp 13  SpO2 95% Physical Exam  Nursing note and vitals reviewed. Constitutional: He is  oriented to person, place, and time. He appears well-developed and well-nourished. No distress.  HENT:  Head: Normocephalic and atraumatic.  Eyes: Pupils are equal, round, and reactive to light.  Neck: Normal range of motion.  Cardiovascular: Normal rate and intact distal pulses.   Pulmonary/Chest: No respiratory distress.  Abdominal: Normal appearance. He exhibits no distension. There is no tenderness. There is no rebound.  Musculoskeletal: Normal range of motion. He exhibits edema (Bilateral lower extremity).  Neurological: He is alert and oriented to person, place, and time. No cranial nerve deficit.  Skin: Skin is warm and dry. No rash noted.  Psychiatric: He has a normal mood and affect. His behavior is normal.    ED Course  Procedures (including critical care time)  Review of cultures done on urine showed greater than 100,000 with no specific organism found.  Review of today's labs findings no significant abnormality.  Plan at this time is to change antibiotics and have followup with primary care physician. Labs Review Labs Reviewed  URINALYSIS, ROUTINE W REFLEX MICROSCOPIC - Abnormal; Notable for the following:    APPearance CLOUDY (*)    All other components within normal limits  COMPREHENSIVE METABOLIC PANEL - Abnormal; Notable for the following:    Glucose, Bld 121 (*)    GFR calc non Af Amer 53 (*)    GFR calc Af Amer 62 (*)    All other components within normal limits  URINE CULTURE  CBC WITH DIFFERENTIAL  LACTIC ACID, PLASMA  LIPASE, BLOOD    Imaging Review No results found.   EKG Interpretation None      MDM   Final diagnoses:  UTI (lower urinary tract infection)        Dot Lanes, MD 09/06/13 1410

## 2013-09-06 NOTE — Discharge Instructions (Signed)
Urinary Tract Infection A urinary tract infection (UTI) can occur any place along the urinary tract. The tract includes the kidneys, ureters, bladder, and urethra. A type of germ called bacteria often causes a UTI. UTIs are often helped with antibiotic medicine.  HOME CARE   If given, take antibiotics as told by your doctor. Finish them even if you start to feel better.  Drink enough fluids to keep your pee (urine) clear or pale yellow.  Avoid tea, drinks with caffeine, and bubbly (carbonated) drinks.  Pee often. Avoid holding your pee in for a long time.  Pee before and after having sex (intercourse).  Wipe from front to back after you poop (bowel movement) if you are a woman. Use each tissue only once. GET HELP RIGHT AWAY IF:   You have back pain.  You have lower belly (abdominal) pain.  You have chills.  You feel sick to your stomach (nauseous).  You throw up (vomit).  Your burning or discomfort with peeing does not go away.  You have a fever.  Your symptoms are not better in 3 days. MAKE SURE YOU:   Understand these instructions.  Will watch your condition.  Will get help right away if you are not doing well or get worse. Document Released: 08/29/2007 Document Revised: 12/05/2011 Document Reviewed: 10/11/2011 [facility] Patient Information 2014 [place], [place].

## 2013-09-07 LAB — URINE CULTURE
Colony Count: NO GROWTH
Culture: NO GROWTH

## 2013-11-06 ENCOUNTER — Emergency Department (HOSPITAL_COMMUNITY)
Admission: EM | Admit: 2013-11-06 | Discharge: 2013-11-06 | Disposition: A | Discharge status: Home / Self Care | Payer: Medicare Other | Attending: Emergency Medicine | Admitting: Emergency Medicine

## 2013-11-06 ENCOUNTER — Encounter (HOSPITAL_COMMUNITY): Payer: Self-pay | Admitting: Emergency Medicine

## 2013-11-06 DIAGNOSIS — M129 Arthropathy, unspecified: Secondary | ICD-10-CM | POA: Diagnosis not present

## 2013-11-06 DIAGNOSIS — F17200 Nicotine dependence, unspecified, uncomplicated: Secondary | ICD-10-CM | POA: Diagnosis not present

## 2013-11-06 DIAGNOSIS — I10 Essential (primary) hypertension: Secondary | ICD-10-CM | POA: Diagnosis not present

## 2013-11-06 DIAGNOSIS — Z87442 Personal history of urinary calculi: Secondary | ICD-10-CM | POA: Diagnosis not present

## 2013-11-06 DIAGNOSIS — E669 Obesity, unspecified: Secondary | ICD-10-CM | POA: Insufficient documentation

## 2013-11-06 DIAGNOSIS — R5383 Other fatigue: Secondary | ICD-10-CM | POA: Diagnosis present

## 2013-11-06 DIAGNOSIS — R5381 Other malaise: Secondary | ICD-10-CM | POA: Insufficient documentation

## 2013-11-06 DIAGNOSIS — J449 Chronic obstructive pulmonary disease, unspecified: Secondary | ICD-10-CM | POA: Insufficient documentation

## 2013-11-06 DIAGNOSIS — Z8701 Personal history of pneumonia (recurrent): Secondary | ICD-10-CM | POA: Insufficient documentation

## 2013-11-06 DIAGNOSIS — Z87448 Personal history of other diseases of urinary system: Secondary | ICD-10-CM | POA: Diagnosis not present

## 2013-11-06 DIAGNOSIS — Z79899 Other long term (current) drug therapy: Secondary | ICD-10-CM | POA: Insufficient documentation

## 2013-11-06 DIAGNOSIS — K219 Gastro-esophageal reflux disease without esophagitis: Secondary | ICD-10-CM | POA: Insufficient documentation

## 2013-11-06 DIAGNOSIS — J4489 Other specified chronic obstructive pulmonary disease: Secondary | ICD-10-CM | POA: Insufficient documentation

## 2013-11-06 DIAGNOSIS — G609 Hereditary and idiopathic neuropathy, unspecified: Secondary | ICD-10-CM | POA: Insufficient documentation

## 2013-11-06 DIAGNOSIS — Z862 Personal history of diseases of the blood and blood-forming organs and certain disorders involving the immune mechanism: Secondary | ICD-10-CM | POA: Insufficient documentation

## 2013-11-06 DIAGNOSIS — E119 Type 2 diabetes mellitus without complications: Secondary | ICD-10-CM | POA: Diagnosis not present

## 2013-11-06 DIAGNOSIS — G629 Polyneuropathy, unspecified: Secondary | ICD-10-CM

## 2013-11-06 DIAGNOSIS — IMO0002 Reserved for concepts with insufficient information to code with codable children: Secondary | ICD-10-CM | POA: Insufficient documentation

## 2013-11-06 LAB — COMPREHENSIVE METABOLIC PANEL
ALT: 20 U/L (ref 0–53)
AST: 19 U/L (ref 0–37)
Albumin: 3.4 g/dL — ABNORMAL LOW (ref 3.5–5.2)
Alkaline Phosphatase: 44 U/L (ref 39–117)
Anion gap: 11 (ref 5–15)
BUN: 18 mg/dL (ref 6–23)
CO2: 29 mEq/L (ref 19–32)
Calcium: 9.3 mg/dL (ref 8.4–10.5)
Chloride: 102 mEq/L (ref 96–112)
Creatinine, Ser: 1.43 mg/dL — ABNORMAL HIGH (ref 0.50–1.35)
GFR calc Af Amer: 55 mL/min — ABNORMAL LOW (ref >90)
GFR calc non Af Amer: 48 mL/min — ABNORMAL LOW (ref >90)
Glucose, Bld: 106 mg/dL — ABNORMAL HIGH (ref 70–99)
Potassium: 3.4 mEq/L — ABNORMAL LOW (ref 3.7–5.3)
Sodium: 142 mEq/L (ref 137–147)
Total Bilirubin: 0.2 mg/dL — ABNORMAL LOW (ref 0.3–1.2)
Total Protein: 7.1 g/dL (ref 6.0–8.3)

## 2013-11-06 LAB — URINE MICROSCOPIC-ADD ON

## 2013-11-06 LAB — CBC
HCT: 39.7 % (ref 39.0–52.0)
Hemoglobin: 13.4 g/dL (ref 13.0–17.0)
MCH: 32.1 pg (ref 26.0–34.0)
MCHC: 33.8 g/dL (ref 30.0–36.0)
MCV: 95 fL (ref 78.0–100.0)
Platelets: 169 10*3/uL (ref 150–400)
RBC: 4.18 MIL/uL — ABNORMAL LOW (ref 4.22–5.81)
RDW: 14 % (ref 11.5–15.5)
WBC: 7.2 10*3/uL (ref 4.0–10.5)

## 2013-11-06 LAB — URINALYSIS, ROUTINE W REFLEX MICROSCOPIC
Bilirubin Urine: NEGATIVE
Glucose, UA: NEGATIVE mg/dL
Hgb urine dipstick: NEGATIVE
Ketones, ur: NEGATIVE mg/dL
Leukocytes, UA: NEGATIVE
Nitrite: POSITIVE — AB
Protein, ur: NEGATIVE mg/dL
Specific Gravity, Urine: 1.021 (ref 1.005–1.030)
Urobilinogen, UA: 0.2 mg/dL (ref 0.0–1.0)
pH: 5.5 (ref 5.0–8.0)

## 2013-11-06 MED ORDER — CEPHALEXIN 500 MG PO CAPS: 500.0000 mg | ORAL_CAPSULE | Freq: Two times a day (BID) | ORAL | Status: DC

## 2013-11-06 MED ORDER — LIDOCAINE HCL (PF) 1 % IJ SOLN
2.1000 mL | Freq: Once | INTRAMUSCULAR | Status: AC
  Administered 2013-11-06: 2.1 mL via INTRADERMAL

## 2013-11-06 MED ORDER — LIDOCAINE HCL (PF) 1 % IJ SOLN
INTRAMUSCULAR | Status: AC
  Administered 2013-11-06: 2.1 mL via INTRADERMAL
  Filled 2013-11-06: qty 5

## 2013-11-06 MED ORDER — CEFTRIAXONE SODIUM 1 G IJ SOLR: 1.0000 g | Freq: Once | INTRAMUSCULAR | Status: DC

## 2013-11-06 MED ORDER — CEFTRIAXONE SODIUM 1 G IJ SOLR
500.0000 mg | Freq: Once | INTRAMUSCULAR | Status: AC
  Administered 2013-11-06: 500 mg via INTRAMUSCULAR
  Filled 2013-11-06: qty 10

## 2013-11-06 NOTE — ED Provider Notes (Signed)
CSN: [id number]     Arrival date & time 11/06/13  1249 History   First MD Initiated Contact with Patient 11/06/13 1256     Chief Complaint  Patient presents with  . Weakness   HPI [name] is a 73 yo man with a PMH of DM (last A1c 6.2% according to patient), HTN, recurrent cystitis (s/p urologic procedures), gout, tobacco abuse, and COPD who is here for assessment of his 3 year history of "wobbly" walking, swollen ankles and numbness in his feet and right hand. He had a recent ED visit for his lower extremity edema with negative LE dopplers (08/22/2013). His balance problems have been evaluated by neurology, who diagnosed peripheral neuropathy with EMG and nerve conduction studies.   Past Medical History  Diagnosis Date  . Nicotine addiction   . Obesity   . Dyslipidemia   . Hypertension   . Elevated WBCs September 2011    and UTI Hospitalized   . Erectile dysfunction   . Arthritis   . Polyneuropathy in other diseases classified elsewhere 12/05/2012  . Lesion of ulnar nerve 12/05/2012    Right side  . Gait disorder   . COPD (chronic obstructive pulmonary disease)     normally have breathing tx prior to surgery  . Pneumonia     hx  . History of kidney stones   . GERD (gastroesophageal reflux disease)   . Neuropathy   . Vertigo   . Diabetes mellitus without complication     bordrline no med   Past Surgical History  Procedure Laterality Date  . Bladder surgery  02/2011    For Diverticulum of bladder removed  . Colon polyp resection    . Cysto     Family History  Problem Relation Age of Onset  . Cancer Father    History  Substance Use Topics  . Smoking status: Current Every Day Smoker -- 0.50 packs/day for 35 years    Types: Cigarettes  . Smokeless tobacco: Never Used  . Alcohol Use: No    Review of Systems General: believes that his lower extremity symptoms are due to exposure injury during war Skin: no rashes HEENT: no headaches Cardiac: no chest pain, no  palpitations Respiratory: no SOB GI: "blowing up" feeling in his abdomen, hunger after BMs Urinary: some recent dysuria, recurrent UTIs Msk: occasional LE pain, swelling in ankles b/l Neurologic: decreased feeling in right hand and LE b/l Endocrine: no heat/cold intolerance, no weight changes Psychiatric: no recent stressors    Allergies  Ciprofloxacin and Levaquin  Home Medications   Prior to Admission medications   Medication Sig Start Date End Date Taking? Authorizing Provider  atenolol-chlorthalidone (TENORETIC) 50-25 MG per tablet Take 1 tablet by mouth at bedtime.     Historical Provider, MD  Fluticasone-Salmeterol (ADVAIR DISKUS) 100-50 MCG/DOSE AEPB Inhale 1 puff into the lungs every 6 (six) hours as needed (shortness of breath).     Historical Provider, MD  gabapentin (NEURONTIN) 400 MG capsule Take 400 mg by mouth daily.    Historical Provider, MD  HYDROcodone-acetaminophen (NORCO/VICODIN) 5-325 MG per tablet Take 1 tablet by mouth every 6 (six) hours as needed for moderate pain.    Historical Provider, MD  ipratropium-albuterol (DUONEB) 0.5-2.5 (3) MG/3ML SOLN Take 3 mLs by nebulization 3 (three) times daily as needed (wheezing).     Historical Provider, MD  naproxen (NAPROSYN) 500 MG tablet Take 500 mg by mouth daily as needed (pain).    Historical Provider, MD  nitrofurantoin, macrocrystal-monohydrate, (  MACROBID) 100 MG capsule Take 1 capsule (100 mg total) by mouth 2 (two) times daily. 09/06/13   Dot Lanes, MD  nitroGLYCERIN (NITROSTAT) 0.4 MG SL tablet Place 0.4 mg under the tongue every 5 (five) minutes as needed for chest pain.    Historical Provider, MD  pantoprazole (PROTONIX) 40 MG tablet Take 40 mg by mouth daily.    Historical Provider, MD  polyethylene glycol (MIRALAX / GLYCOLAX) packet Take 17 g by mouth daily as needed for mild constipation.    Historical Provider, MD  predniSONE (DELTASONE) 10 MG tablet Take 20 mg by mouth daily.     Historical Provider, MD   pregabalin (LYRICA) 150 MG capsule Take 150 mg by mouth at bedtime.    Historical Provider, MD  sildenafil (VIAGRA) 100 MG tablet Take 100 mg by mouth daily as needed for erectile dysfunction. One tablet 30 minutes before intercourse, maximum twice weekly    Historical Provider, MD  tamsulosin (FLOMAX) 0.4 MG CAPS capsule Take 0.4 mg by mouth 2 (two) times daily.    Historical Provider, MD  tiotropium (SPIRIVA) 18 MCG inhalation capsule Place 18 mcg into inhaler and inhale daily.      Historical Provider, MD   BP 123/79  Pulse 66  Temp(Src) 98 F (36.7 C) (Oral)  Resp 11  SpO2 97% Physical Exam Appearance: in NAD, chatting with employees in hallway on wheelchair ride into room, then lying back in bed with wife at bedside HEENT: [place]/AT, PERRL, EOMi, slightly yellowed sclera Heart: RRR, normal S1S2, no MRG Lungs: CTAB, no wheezes Abdomen: BS+, soft, nontender, no hepatosplenomegaly Musculoskeletal: significant edema in ankles b/l with 2+ pitting edema laterally in both lower extremities up to 1" above ankles Neurologic: strength 4/5 RUE and RLE, 5/5 LUE and LLE, sensation to light touch and temperature decreased on right side of body, full on left side, coordination intact, right arm tremor noted on  Skin: dry skin, no rashes or lesions   ED Course  Procedures (including critical care time) Labs Review Labs Reviewed  CBC - Abnormal; Notable for the following:    RBC 4.18 (*)    All other components within normal limits  COMPREHENSIVE METABOLIC PANEL  URINALYSIS, ROUTINE W REFLEX MICROSCOPIC    Imaging Review No results found.   EKG Interpretation None      MDM   Final diagnoses:  None    [name] is a 4 yo man presenting with a 3 year history of lower extremity numbness, "wobbling" and bilateral lower extremity edema. His exam is similar to exams noted in prior visits. Because of his history of recurrent UTIs, a UA will be drawn. In addition, a CBC and CMET will be  sent. Recent neurology and DVT workups are complete, without need for further investigation at this time.  The patient's CBC and CMET were very similar to his prior labs; he has a slightly decreased potassium at 3.4, a slightly elevated creatinine at 1.43 (baseline seems to be around 1.3). His UA was positive for nitrites, with 3-6 WBC. The patient is very concerned about UTIs. He was given rocephin 1g IV in the ED and sent out on a 7 day course of Keflex PO. He also requested a urology referral, which was given. He also requested compression stocking rx, which was given.    Drucilla Schmidt, MD 11/06/13 [phone]

## 2013-11-06 NOTE — ED Notes (Signed)
MD at bedside. 

## 2013-11-06 NOTE — ED Notes (Signed)
Leg weakness states started 5 years ago has been getting worse x 3 -4 weeks swelling in his ankles, out of metformin he states

## 2013-11-06 NOTE — ED Notes (Signed)
Pharmacy notified to send Rocephin 500 mg inj to [facility] B tube station, med not in [place] B pyxis

## 2013-11-06 NOTE — Discharge Instructions (Signed)
Peripheral Neuropathy Peripheral neuropathy is a type of nerve damage. It affects nerves that carry signals between the spinal cord and other parts of the body. These are called peripheral nerves. With peripheral neuropathy, one nerve or a group of nerves may be damaged.  CAUSES  Many things can damage peripheral nerves. For some people with peripheral neuropathy, the cause is unknown. Some causes include:  Diabetes. This is the most common cause of peripheral neuropathy.  Injury to a nerve.  Pressure or stress on a nerve that lasts a long time.  Too little vitamin B. Alcoholism can lead to this.  Infections.  Autoimmune diseases, such as multiple sclerosis and systemic lupus erythematosus.  Inherited nerve diseases.  Some medicines, such as cancer drugs.  Toxic substances, such as lead and mercury.  Too little blood flowing to the legs.  Kidney disease.  Thyroid disease. SIGNS AND SYMPTOMS  Different people have different symptoms. The symptoms you have will depend on which of your nerves is damaged. Common symptoms include:  Loss of feeling (numbness) in the feet and hands.  Tingling in the feet and hands.  Pain that burns.  Very sensitive skin.  Weakness.  Not being able to move a part of the body (paralysis).  Muscle twitching.  Clumsiness or poor coordination.  Loss of balance.  Not being able to control your bladder.  Feeling dizzy.  Sexual problems. DIAGNOSIS  Peripheral neuropathy is a symptom, not a disease. Finding the cause of peripheral neuropathy can be hard. To figure that out, your health care provider will take a medical history and do a physical exam. A neurological exam will also be done. This involves checking things affected by your brain, spinal cord, and nerves (nervous system). For example, your health care provider will check your reflexes, how you move, and what you can feel.  Other types of tests may also be ordered, such as:  Blood  tests.  A test of the fluid in your spinal cord.  Imaging tests, such as CT scans or an MRI.  Electromyography (EMG). This test checks the nerves that control muscles.  Nerve conduction velocity tests. These tests check how fast messages pass through your nerves.  Nerve biopsy. A small piece of nerve is removed. It is then checked under a microscope. TREATMENT   Medicine is often used to treat peripheral neuropathy. Medicines may include:  Pain-relieving medicines. Prescription or over-the-counter medicine may be suggested.  Antiseizure medicine. This may be used for pain.  Antidepressants. These also may help ease pain from neuropathy.  Lidocaine. This is a numbing medicine. You might wear a patch or be given a shot.  Mexiletine. This medicine is typically used to help control irregular heart rhythms.  Surgery. Surgery may be needed to relieve pressure on a nerve or to destroy a nerve that is causing pain.  Physical therapy to help movement.  Assistive devices to help movement. HOME CARE INSTRUCTIONS   Only take over-the-counter or prescription medicines as directed by your health care provider. Follow the instructions carefully for any given medicines. Do not take any other medicines without first getting approval from your health care provider.  If you have diabetes, work closely with your health care provider to keep your blood sugar under control.  If you have numbness in your feet:  Check every day for signs of injury or infection. Watch for redness, warmth, and swelling.  Wear padded socks and comfortable shoes. These help protect your feet.  Do not do  things that put pressure on your damaged nerve.  Do not smoke. Smoking keeps blood from getting to damaged nerves.  Avoid or limit alcohol. Too much alcohol can cause a lack of B vitamins. These vitamins are needed for healthy nerves.  Develop a good support system. Coping with peripheral neuropathy can be  stressful. Talk to a mental health specialist or join a support group if you are struggling.  Follow up with your health care provider as directed. SEEK MEDICAL CARE IF:   You have new signs or symptoms of peripheral neuropathy.  You are struggling emotionally from dealing with peripheral neuropathy.  You have a fever. SEEK IMMEDIATE MEDICAL CARE IF:   You have an injury or infection that is not healing.  You feel very dizzy or begin vomiting.  You have chest pain.  You have trouble breathing. Document Released: 03/02/2002 Document Revised: 11/22/2010 Document Reviewed: 11/17/2012 [facility] Patient Information 2015 [place], [place]. This information is not intended to replace advice given to you by your health care provider. Make sure you discuss any questions you have with your health care provider.   Urinary Tract Infection Urinary tract infections (UTIs) can develop anywhere along your urinary tract. Your urinary tract is your body's drainage system for removing wastes and extra water. Your urinary tract includes two kidneys, two ureters, a bladder, and a urethra. Your kidneys are a pair of bean-shaped organs. Each kidney is about the size of your fist. They are located below your ribs, one on each side of your spine. CAUSES Infections are caused by microbes, which are microscopic organisms, including fungi, viruses, and bacteria. These organisms are so small that they can only be seen through a microscope. Bacteria are the microbes that most commonly cause UTIs. SYMPTOMS  Symptoms of UTIs may vary by age and gender of the patient and by the location of the infection. Symptoms in young women typically include a frequent and intense urge to urinate and a painful, burning feeling in the bladder or urethra during urination. Older women and men are more likely to be tired, shaky, and weak and have muscle aches and abdominal pain. A fever may mean the infection is in your kidneys. Other  symptoms of a kidney infection include pain in your back or sides below the ribs, nausea, and vomiting. DIAGNOSIS To diagnose a UTI, your caregiver will ask you about your symptoms. Your caregiver also will ask to provide a urine sample. The urine sample will be tested for bacteria and white blood cells. White blood cells are made by your body to help fight infection. TREATMENT  Typically, UTIs can be treated with medication. Because most UTIs are caused by a bacterial infection, they usually can be treated with the use of antibiotics. The choice of antibiotic and length of treatment depend on your symptoms and the type of bacteria causing your infection. HOME CARE INSTRUCTIONS  If you were prescribed antibiotics, take them exactly as your caregiver instructs you. Finish the medication even if you feel better after you have only taken some of the medication.  Drink enough water and fluids to keep your urine clear or pale yellow.  Avoid caffeine, tea, and carbonated beverages. They tend to irritate your bladder.  Empty your bladder often. Avoid holding urine for long periods of time.  Empty your bladder before and after sexual intercourse.  After a bowel movement, women should cleanse from front to back. Use each tissue only once. SEEK MEDICAL CARE IF:   You have  back pain.  You develop a fever.  Your symptoms do not begin to resolve within 3 days. SEEK IMMEDIATE MEDICAL CARE IF:   You have severe back pain or lower abdominal pain.  You develop chills.  You have nausea or vomiting.  You have continued burning or discomfort with urination. MAKE SURE YOU:   Understand these instructions.  Will watch your condition.  Will get help right away if you are not doing well or get worse. Document Released: 12/20/2004 Document Revised: 09/11/2011 Document Reviewed: 04/20/2011 [facility] Patient Information 2015 [place], [place]. This information is not intended to replace advice given to  you by your health care provider. Make sure you discuss any questions you have with your health care provider.

## 2013-11-09 NOTE — ED Provider Notes (Signed)
I saw and evaluated the patient, reviewed the resident's note and I agree with the findings and plan.   EKG Interpretation None       Patient with acute on chronic weakness. His acute weaknesses over the past several weeks to months. He's had an extensive workup, including at multiple facilities such as [facility] clinic. No obvious new etiology for his weakness. Has a questionable UTI with positive nitrites, will treat with antibiotics and get him urology followup. We'll recommend he follow up with his neurologist as well for his chronic weakness.  Ephraim Hamburger, MD 11/09/13 1341

## 2014-01-25 ENCOUNTER — Other Ambulatory Visit (INDEPENDENT_AMBULATORY_CARE_PROVIDER_SITE_OTHER): Payer: Self-pay | Admitting: Surgery

## 2014-02-03 ENCOUNTER — Emergency Department (HOSPITAL_COMMUNITY)
Admission: EM | Admit: 2014-02-03 | Discharge: 2014-02-03 | Disposition: A | Discharge status: Home / Self Care | Payer: Medicare Other | Attending: Emergency Medicine | Admitting: Emergency Medicine

## 2014-02-03 ENCOUNTER — Emergency Department (HOSPITAL_COMMUNITY): Payer: Medicare Other

## 2014-02-03 ENCOUNTER — Encounter (HOSPITAL_COMMUNITY): Payer: Self-pay | Admitting: Emergency Medicine

## 2014-02-03 DIAGNOSIS — Z79899 Other long term (current) drug therapy: Secondary | ICD-10-CM | POA: Insufficient documentation

## 2014-02-03 DIAGNOSIS — N3000 Acute cystitis without hematuria: Secondary | ICD-10-CM | POA: Diagnosis not present

## 2014-02-03 DIAGNOSIS — E669 Obesity, unspecified: Secondary | ICD-10-CM | POA: Insufficient documentation

## 2014-02-03 DIAGNOSIS — Z87442 Personal history of urinary calculi: Secondary | ICD-10-CM | POA: Insufficient documentation

## 2014-02-03 DIAGNOSIS — Z792 Long term (current) use of antibiotics: Secondary | ICD-10-CM | POA: Diagnosis not present

## 2014-02-03 DIAGNOSIS — M1990 Unspecified osteoarthritis, unspecified site: Secondary | ICD-10-CM | POA: Diagnosis not present

## 2014-02-03 DIAGNOSIS — K219 Gastro-esophageal reflux disease without esophagitis: Secondary | ICD-10-CM | POA: Insufficient documentation

## 2014-02-03 DIAGNOSIS — Z720 Tobacco use: Secondary | ICD-10-CM | POA: Insufficient documentation

## 2014-02-03 DIAGNOSIS — Z8701 Personal history of pneumonia (recurrent): Secondary | ICD-10-CM | POA: Insufficient documentation

## 2014-02-03 DIAGNOSIS — R1084 Generalized abdominal pain: Secondary | ICD-10-CM | POA: Diagnosis present

## 2014-02-03 DIAGNOSIS — J449 Chronic obstructive pulmonary disease, unspecified: Secondary | ICD-10-CM | POA: Diagnosis not present

## 2014-02-03 DIAGNOSIS — I10 Essential (primary) hypertension: Secondary | ICD-10-CM | POA: Diagnosis not present

## 2014-02-03 DIAGNOSIS — R109 Unspecified abdominal pain: Secondary | ICD-10-CM

## 2014-02-03 DIAGNOSIS — G8929 Other chronic pain: Secondary | ICD-10-CM | POA: Diagnosis not present

## 2014-02-03 DIAGNOSIS — Z7952 Long term (current) use of systemic steroids: Secondary | ICD-10-CM | POA: Insufficient documentation

## 2014-02-03 LAB — URINALYSIS, ROUTINE W REFLEX MICROSCOPIC
Bilirubin Urine: NEGATIVE
Glucose, UA: NEGATIVE mg/dL
Hgb urine dipstick: NEGATIVE
Ketones, ur: NEGATIVE mg/dL
Nitrite: POSITIVE — AB
Protein, ur: NEGATIVE mg/dL
Specific Gravity, Urine: 1.024 (ref 1.005–1.030)
Urobilinogen, UA: 1 mg/dL (ref 0.0–1.0)
pH: 6.5 (ref 5.0–8.0)

## 2014-02-03 LAB — COMPREHENSIVE METABOLIC PANEL
ALT: 17 U/L (ref 0–53)
AST: 15 U/L (ref 0–37)
Albumin: 3.6 g/dL (ref 3.5–5.2)
Alkaline Phosphatase: 53 U/L (ref 39–117)
Anion gap: 15 (ref 5–15)
BUN: 16 mg/dL (ref 6–23)
CO2: 29 mEq/L (ref 19–32)
Calcium: 9.4 mg/dL (ref 8.4–10.5)
Chloride: 98 mEq/L (ref 96–112)
Creatinine, Ser: 1.36 mg/dL — ABNORMAL HIGH (ref 0.50–1.35)
GFR calc Af Amer: 59 mL/min — ABNORMAL LOW (ref >90)
GFR calc non Af Amer: 51 mL/min — ABNORMAL LOW (ref >90)
Glucose, Bld: 109 mg/dL — ABNORMAL HIGH (ref 70–99)
Potassium: 3.5 mEq/L — ABNORMAL LOW (ref 3.7–5.3)
Sodium: 142 mEq/L (ref 137–147)
Total Bilirubin: 0.3 mg/dL (ref 0.3–1.2)
Total Protein: 7.7 g/dL (ref 6.0–8.3)

## 2014-02-03 LAB — LIPASE, BLOOD: Lipase: 43 U/L (ref 11–59)

## 2014-02-03 LAB — URINE MICROSCOPIC-ADD ON

## 2014-02-03 LAB — CBC WITH DIFFERENTIAL/PLATELET
Basophils Absolute: 0 10*3/uL (ref 0.0–0.1)
Basophils Relative: 0 % (ref 0–1)
Eosinophils Absolute: 0.2 10*3/uL (ref 0.0–0.7)
Eosinophils Relative: 3 % (ref 0–5)
HCT: 43.3 % (ref 39.0–52.0)
Hemoglobin: 14.9 g/dL (ref 13.0–17.0)
Lymphocytes Relative: 26 % (ref 12–46)
Lymphs Abs: 1.8 10*3/uL (ref 0.7–4.0)
MCH: 32 pg (ref 26.0–34.0)
MCHC: 34.4 g/dL (ref 30.0–36.0)
MCV: 93.1 fL (ref 78.0–100.0)
Monocytes Absolute: 0.5 10*3/uL (ref 0.1–1.0)
Monocytes Relative: 7 % (ref 3–12)
Neutro Abs: 4.5 10*3/uL (ref 1.7–7.7)
Neutrophils Relative %: 64 % (ref 43–77)
Platelets: 176 10*3/uL (ref 150–400)
RBC: 4.65 MIL/uL (ref 4.22–5.81)
RDW: 13.9 % (ref 11.5–15.5)
WBC: 7.1 10*3/uL (ref 4.0–10.5)

## 2014-02-03 MED ORDER — NITROFURANTOIN MONOHYD MACRO 100 MG PO CAPS: 100.0000 mg | ORAL_CAPSULE | Freq: Two times a day (BID) | ORAL | Status: DC

## 2014-02-03 MED ORDER — IOHEXOL 300 MG/ML  SOLN
100.0000 mL | Freq: Once | INTRAMUSCULAR | Status: AC | PRN
  Administered 2014-02-03: 100 mL via INTRAVENOUS

## 2014-02-03 MED ORDER — HYDROCODONE-ACETAMINOPHEN 5-325 MG PO TABS: 1.0000 | ORAL_TABLET | Freq: Four times a day (QID) | ORAL | Status: DC | PRN

## 2014-02-03 MED ORDER — SODIUM CHLORIDE 0.9 % IV BOLUS (SEPSIS)
500.0000 mL | Freq: Once | INTRAVENOUS | Status: AC
  Administered 2014-02-03: 500 mL via INTRAVENOUS

## 2014-02-03 MED ORDER — IOHEXOL 300 MG/ML  SOLN
25.0000 mL | Freq: Once | INTRAMUSCULAR | Status: AC | PRN
  Administered 2014-02-03: 25 mL via ORAL

## 2014-02-03 NOTE — ED Provider Notes (Signed)
CSN: [id number]     Arrival date & time 02/03/14  0140 History   First MD Initiated Contact with Patient 02/03/14 0217     Chief Complaint  Patient presents with  . Abdominal Pain     (Consider location/radiation/quality/duration/timing/severity/associated sxs/prior Treatment) HPI Comments: Patient is a 73 year old male with history of COPD, GERD, diabetes, and neuropathy. Presents tonight with complaints of abdominal pain. His pain is located all over his abdomen and worsened by eating or drinking. He denies any fevers or chills. He tells me he had an ultrasound performed at [facility] and has been referred to Dr. Ninfa Linden for removal of his gallbladder due to gallstones.  Patient is a 73 y.o. male presenting with abdominal pain. The history is provided by the patient.  Abdominal Pain Pain location:  Generalized Pain quality: bloating and cramping   Pain radiates to:  Does not radiate Pain severity:  Moderate Onset quality:  Gradual Duration: many months. Timing:  Intermittent Progression:  Worsening Chronicity:  Chronic Relieved by:  Nothing Worsened by:  Palpation and eating   Past Medical History  Diagnosis Date  . Nicotine addiction   . Obesity   . Dyslipidemia   . Hypertension   . Elevated WBCs September 2011    and UTI Hospitalized   . Erectile dysfunction   . Arthritis   . Polyneuropathy in other diseases classified elsewhere 12/05/2012  . Lesion of ulnar nerve 12/05/2012    Right side  . Gait disorder   . COPD (chronic obstructive pulmonary disease)     normally have breathing tx prior to surgery  . Pneumonia     hx  . History of kidney stones   . GERD (gastroesophageal reflux disease)   . Neuropathy   . Vertigo   . Diabetes mellitus without complication     bordrline no med   Past Surgical History  Procedure Laterality Date  . Bladder surgery  02/2011    For Diverticulum of bladder removed  . Colon polyp resection    . Cysto     Family  History  Problem Relation Age of Onset  . Cancer Father    History  Substance Use Topics  . Smoking status: Current Every Day Smoker -- 0.50 packs/day for 35 years    Types: Cigarettes  . Smokeless tobacco: Never Used  . Alcohol Use: No    Review of Systems  Gastrointestinal: Positive for abdominal pain.  All other systems reviewed and are negative.     Allergies  Ciprofloxacin and Levaquin  Home Medications   Prior to Admission medications   Medication Sig Start Date End Date Taking? Authorizing Provider  atenolol-chlorthalidone (TENORETIC) 50-25 MG per tablet Take 1 tablet by mouth at bedtime.     Historical Provider, MD  cephALEXin (KEFLEX) 500 MG capsule Take 1 capsule (500 mg total) by mouth 2 (two) times daily. 11/06/13   Drucilla Schmidt, MD  Fluticasone-Salmeterol (ADVAIR DISKUS) 100-50 MCG/DOSE AEPB Inhale 1 puff into the lungs every 6 (six) hours as needed (shortness of breath).     Historical Provider, MD  gabapentin (NEURONTIN) 400 MG capsule Take 400 mg by mouth daily.    Historical Provider, MD  HYDROcodone-acetaminophen (NORCO/VICODIN) 5-325 MG per tablet Take 1 tablet by mouth every 6 (six) hours as needed for moderate pain.    Historical Provider, MD  ipratropium-albuterol (DUONEB) 0.5-2.5 (3) MG/3ML SOLN Take 3 mLs by nebulization 3 (three) times daily as needed (wheezing).     Historical Provider, MD  meclizine (ANTIVERT) 50 MG tablet Take 50 mg by mouth 2 (two) times daily as needed.    Historical Provider, MD  naproxen (NAPROSYN) 500 MG tablet Take 500 mg by mouth daily as needed (pain).    Historical Provider, MD  nitrofurantoin, macrocrystal-monohydrate, (MACROBID) 100 MG capsule Take 1 capsule (100 mg total) by mouth 2 (two) times daily. 09/06/13   Dot Lanes, MD  nitroGLYCERIN (NITROSTAT) 0.4 MG SL tablet Place 0.4 mg under the tongue every 5 (five) minutes as needed for chest pain.    Historical Provider, MD  pantoprazole (PROTONIX) 40 MG tablet Take 40  mg by mouth daily.    Historical Provider, MD  polyethylene glycol (MIRALAX / GLYCOLAX) packet Take 17 g by mouth daily as needed for mild constipation.    Historical Provider, MD  predniSONE (DELTASONE) 10 MG tablet Take 20 mg by mouth daily.     Historical Provider, MD  pregabalin (LYRICA) 150 MG capsule Take 150 mg by mouth at bedtime.    Historical Provider, MD  sildenafil (VIAGRA) 100 MG tablet Take 100 mg by mouth daily as needed for erectile dysfunction. One tablet 30 minutes before intercourse, maximum twice weekly    Historical Provider, MD  tamsulosin (FLOMAX) 0.4 MG CAPS capsule Take 0.4 mg by mouth 2 (two) times daily.    Historical Provider, MD  tiotropium (SPIRIVA) 18 MCG inhalation capsule Place 18 mcg into inhaler and inhale daily.      Historical Provider, MD   BP 134/88 mmHg  Pulse 93  Temp(Src) 98 F (36.7 C) (Oral)  Resp 18  SpO2 99% Physical Exam  Constitutional: He is oriented to person, place, and time. He appears well-developed and well-nourished. No distress.  HENT:  Head: Normocephalic and atraumatic.  Neck: Normal range of motion. Neck supple.  Cardiovascular: Normal rate, regular rhythm and normal heart sounds.   No murmur heard. Pulmonary/Chest: Effort normal and breath sounds normal. No respiratory distress. He has no wheezes.  Abdominal: Soft. Bowel sounds are normal. He exhibits no distension. There is tenderness.  There is tenderness to palpation in all 4 quadrants with no rebound and no guarding.  Musculoskeletal: Normal range of motion. He exhibits no edema.  Neurological: He is alert and oriented to person, place, and time.  Skin: Skin is warm and dry. He is not diaphoretic.  Nursing note and vitals reviewed.   ED Course  Procedures (including critical care time) Labs Review Labs Reviewed  COMPREHENSIVE METABOLIC PANEL  LIPASE, BLOOD  CBC WITH DIFFERENTIAL  PREGNANCY, URINE    Imaging Review No results found.   EKG  Interpretation   Date/Time:  Wednesday February 03 2014 01:49:13 EST Ventricular Rate:  95 PR Interval:  148 QRS Duration: 80 QT Interval:  360 QTC Calculation: 452 R Axis:   75 Text Interpretation:  Normal sinus rhythm Nonspecific ST abnormality  Abnormal ECG Confirmed by DELOS  MD, Ivelis Norgard ([id number]) on 02/03/2014 2:34:35  AM      MDM   Final diagnoses:  None    Patient presents with complaints of generalized abdominal pain. He tells me he has known gallstones and is scheduled to have his gallbladder removed by Dr. Ninfa Linden. He presents here with worsening discomfort. He is diffusely tender without rebound or guarding. CAT scan reveals no emergent intra-abdominal pathology, however UA does reflect a UTI. He will be treated with Bactrim, pain meds, and follow-up with Dr. Ninfa Linden.    Veryl Speak, MD 02/03/14 [phone]

## 2014-02-03 NOTE — ED Notes (Signed)
Pt c/o upper abd pain for several months. Pt is scheduled to have gallbladder removed 12/2.  Pt also c/o abd being bloated and nausea  For several months

## 2014-02-03 NOTE — Discharge Instructions (Signed)
Macrobid as prescribed.  Hydrocodone as prescribed as needed for pain.  Follow-up with Dr. Ninfa Linden is scheduled to discuss your gallbladder.   Abdominal Pain Many things can cause abdominal pain. Usually, abdominal pain is not caused by a disease and will improve without treatment. It can often be observed and treated at home. Your health care provider will do a physical exam and possibly order blood tests and X-rays to help determine the seriousness of your pain. However, in many cases, more time must pass before a clear cause of the pain can be found. Before that point, your health care provider may not know if you need more testing or further treatment. HOME CARE INSTRUCTIONS  Monitor your abdominal pain for any changes. The following actions may help to alleviate any discomfort you are experiencing:  Only take over-the-counter or prescription medicines as directed by your health care provider.  Do not take laxatives unless directed to do so by your health care provider.  Try a clear liquid diet (broth, tea, or water) as directed by your health care provider. Slowly move to a bland diet as tolerated. SEEK MEDICAL CARE IF:  You have unexplained abdominal pain.  You have abdominal pain associated with nausea or diarrhea.  You have pain when you urinate or have a bowel movement.  You experience abdominal pain that wakes you in the night.  You have abdominal pain that is worsened or improved by eating food.  You have abdominal pain that is worsened with eating fatty foods.  You have a fever. SEEK IMMEDIATE MEDICAL CARE IF:   Your pain does not go away within 2 hours.  You keep throwing up (vomiting).  Your pain is felt only in portions of the abdomen, such as the right side or the left lower portion of the abdomen.  You pass bloody or black tarry stools. MAKE SURE YOU:  Understand these instructions.   Will watch your condition.   Will get help right away if you are  not doing well or get worse.  Document Released: 12/20/2004 Document Revised: 03/17/2013 Document Reviewed: 11/19/2012 [facility] Patient Information 2015 [place], [place]. This information is not intended to replace advice given to you by your health care provider. Make sure you discuss any questions you have with your health care provider.  Urinary Tract Infection Urinary tract infections (UTIs) can develop anywhere along your urinary tract. Your urinary tract is your body's drainage system for removing wastes and extra water. Your urinary tract includes two kidneys, two ureters, a bladder, and a urethra. Your kidneys are a pair of bean-shaped organs. Each kidney is about the size of your fist. They are located below your ribs, one on each side of your spine. CAUSES Infections are caused by microbes, which are microscopic organisms, including fungi, viruses, and bacteria. These organisms are so small that they can only be seen through a microscope. Bacteria are the microbes that most commonly cause UTIs. SYMPTOMS  Symptoms of UTIs may vary by age and gender of the patient and by the location of the infection. Symptoms in young women typically include a frequent and intense urge to urinate and a painful, burning feeling in the bladder or urethra during urination. Older women and men are more likely to be tired, shaky, and weak and have muscle aches and abdominal pain. A fever may mean the infection is in your kidneys. Other symptoms of a kidney infection include pain in your back or sides below the ribs, nausea, and vomiting. DIAGNOSIS  To diagnose a UTI, your caregiver will ask you about your symptoms. Your caregiver also will ask to provide a urine sample. The urine sample will be tested for bacteria and white blood cells. White blood cells are made by your body to help fight infection. TREATMENT  Typically, UTIs can be treated with medication. Because most UTIs are caused by a bacterial infection, they  usually can be treated with the use of antibiotics. The choice of antibiotic and length of treatment depend on your symptoms and the type of bacteria causing your infection. HOME CARE INSTRUCTIONS  If you were prescribed antibiotics, take them exactly as your caregiver instructs you. Finish the medication even if you feel better after you have only taken some of the medication.  Drink enough water and fluids to keep your urine clear or pale yellow.  Avoid caffeine, tea, and carbonated beverages. They tend to irritate your bladder.  Empty your bladder often. Avoid holding urine for long periods of time.  Empty your bladder before and after sexual intercourse.  After a bowel movement, women should cleanse from front to back. Use each tissue only once. SEEK MEDICAL CARE IF:   You have back pain.  You develop a fever.  Your symptoms do not begin to resolve within 3 days. SEEK IMMEDIATE MEDICAL CARE IF:   You have severe back pain or lower abdominal pain.  You develop chills.  You have nausea or vomiting.  You have continued burning or discomfort with urination. MAKE SURE YOU:   Understand these instructions.  Will watch your condition.  Will get help right away if you are not doing well or get worse. Document Released: 12/20/2004 Document Revised: 09/11/2011 Document Reviewed: 04/20/2011 [facility] Patient Information 2015 [place], [place]. This information is not intended to replace advice given to you by your health care provider. Make sure you discuss any questions you have with your health care provider.

## 2014-02-16 ENCOUNTER — Encounter (HOSPITAL_BASED_OUTPATIENT_CLINIC_OR_DEPARTMENT_OTHER): Payer: Self-pay | Admitting: *Deleted

## 2014-02-16 NOTE — Progress Notes (Signed)
   02/16/14 1602  OBSTRUCTIVE SLEEP APNEA  Have you ever been diagnosed with sleep apnea through a sleep study? No  Do you snore loudly (loud enough to be heard through closed doors)?  0  Do you often feel tired, fatigued, or sleepy during the daytime? 0  Has anyone observed you stop breathing during your sleep? 0  Do you have, or are you being treated for high blood pressure? 1  BMI more than 35 kg/m2? 0  Age over 72 years old? 1  Neck circumference greater than 40 cm/16 inches? 1  Gender: 1  Obstructive Sleep Apnea Score 4  Score 4 or greater  Results sent to PCP

## 2014-02-16 NOTE — Progress Notes (Signed)
Pt was in er 02/03/14-had ekg,cbc ,cmet-abd pain-recent abscess tooth,uti, On antibiotics-says dr Ninfa Linden aware To bring all meds dos

## 2014-02-23 NOTE — H&P (Signed)
Jonathan [name]. Jonathan [name]  Location: [facility] Patient #: [id number] DOB: [birth year] Widowed / Language: Cleophus Molt / Race: White Male  History of Present Illness  Patient words: DISCUSS GALLBLADDER REMOVAL.  The patient is a 73 year old male who presents for evaluation of gall stones. This pleasant gentleman is referred by Dr. Celedonio Savage for evaluation of symptomatic cholelithiasis. He has been having pain in the right upper quadrant with nausea and bloating for several years. He refers to the epigastrium. It is worse with fatty meals. He is otherwise without complaints. He denies jaundice. The pain is moderate and sharp in intensity.   Other Problems  Cholelithiasis Depression High blood pressure  Diagnostic Studies History  Colonoscopy within last year  Allergies  Ciprofloxacin *Fluoroquinolones** Levaquin *FLUOROQUINOLONES*  Medication History  Atenolol-Chlorthalidone (50-25MG  Tablet, Oral) Active. Advair Diskus (100-50MCG/DOSE Aero Pow Br Act, Inhalation) Active. Neurontin (400MG  Capsule, Oral) Active. Norco (5-325MG  Tablet, Oral) Active. DuoNeb (0.5-2.5 (3)MG/3ML Solution, Inhalation) Active. Antivert (50MG  Tablet, Oral) Active. Naprosyn (500MG  Tablet, Oral) Active. Macrobid (100MG  Capsule, Oral) Active. Protonix (40MG  Packet, Oral) Active. MiraLax (Oral) Active. PredniSONE (Pak) (10MG  Tablet, Oral) Active. Viagra (100MG  Tablet, Oral) Active. Flomax (0.4MG  Capsule ER 24HR, Oral) Active. Spiriva HandiHaler (18MCG Capsule, Inhalation) Active. B-12 Dots (500MCG Tablet Disperse, Oral) Active.  Social History  Tobacco use Current every day smoker.  Review of Systems  Skin Present- Dryness. Not Present- Change in Wart/Mole, Hives, Jaundice, New Lesions, Non-Healing Wounds, Rash and Ulcer. HEENT Not Present- Earache, Hearing Loss, Hoarseness, Nose Bleed, Oral Ulcers, Ringing in the Ears, Seasonal Allergies, Sinus Pain, Sore Throat, Visual  Disturbances, Wears glasses/contact lenses and Yellow Eyes. Gastrointestinal Present- Nausea. Not Present- Abdominal Pain, Bloating, Bloody Stool, Change in Bowel Habits, Chronic diarrhea, Constipation, Difficulty Swallowing, Excessive gas, Gets full quickly at meals, Hemorrhoids, Indigestion, Rectal Pain and Vomiting.   Vitals   Weight: 286.38 lb Height: 80in Body Surface Area: 2.71 m Body Mass Index: 31.46 kg/m Temp.: 98.15F  Pulse: 80 (Regular)  Resp.: 18 (Unlabored)  BP: 138/80 (Sitting, Left Arm, Standard)    Physical Exam  General Mental Status-Alert. General Appearance-Consistent with stated age. Hydration-Well hydrated. Voice-Normal.  Head and Neck Head-normocephalic, atraumatic with no lesions or palpable masses.  Eye Eyeball - Bilateral-Extraocular movements intact. Sclera/Conjunctiva - Bilateral-No scleral icterus.  Chest and Lung Exam Chest and lung exam reveals -quiet, even and easy respiratory effort with no use of accessory muscles and on auscultation, normal breath sounds, no adventitious sounds and normal vocal resonance. Inspection Chest Wall - Normal. Back - normal.  Cardiovascular Cardiovascular examination reveals -on palpation PMI is normal in location and amplitude, no palpable S3 or S4. Normal cardiac borders., normal heart sounds, regular rate and rhythm with no murmurs, carotid auscultation reveals no bruits and normal pedal pulses bilaterally.  Abdomen Inspection Inspection of the abdomen reveals - No Hernias. Skin - Scar - no surgical scars. Palpation/Percussion Palpation and Percussion of the abdomen reveal - Soft. Note: There is tenderness with mild guarding in the right upper quadrant. Auscultation Auscultation of the abdomen reveals - Bowel sounds normal.  Neurologic Neurologic evaluation reveals -alert and oriented x 3 with no impairment of recent or remote memory. Mental  Status-Normal.  Musculoskeletal Normal Exam - Left-Upper Extremity Strength Normal and Lower Extremity Strength Normal. Normal Exam - Right-Upper Extremity Strength Normal, Lower Extremity Weakness.    Assessment & Plan  SYMPTOMATIC CHOLELITHIASIS (574.20  K80.20) Impression: I have reviewed his ultrasound showing cholelithiasis. The bile duct is normal. Liver function tests  are normal. Laparoscopic cholecystectomy with possible cholangiogram is recommended. I discussed this with him in detail and gave him literature regarding the surgery. I discussed the risks which includes but is not limited to bleeding, infection, bile duct injury, bile leak, injury to surrounding structures, and need to convert to an open procedure, cardiopulmonary issues given his COPD, et Ronney Asters. I also discussed postoperative recovery. He understands and wishes to proceed.

## 2014-02-24 ENCOUNTER — Ambulatory Visit (HOSPITAL_BASED_OUTPATIENT_CLINIC_OR_DEPARTMENT_OTHER)
Admission: RE | Admit: 2014-02-24 | Discharge: 2014-02-24 | Disposition: A | Discharge status: Home / Self Care | Payer: Medicare Other | Source: Ambulatory Visit | Attending: Surgery | Admitting: Surgery

## 2014-02-24 ENCOUNTER — Ambulatory Visit (HOSPITAL_BASED_OUTPATIENT_CLINIC_OR_DEPARTMENT_OTHER): Payer: Medicare Other | Admitting: Anesthesiology

## 2014-02-24 ENCOUNTER — Encounter (HOSPITAL_BASED_OUTPATIENT_CLINIC_OR_DEPARTMENT_OTHER): Payer: Self-pay | Admitting: Anesthesiology

## 2014-02-24 ENCOUNTER — Encounter (HOSPITAL_BASED_OUTPATIENT_CLINIC_OR_DEPARTMENT_OTHER)
Admission: RE | Disposition: A | Discharge status: Home / Self Care | Payer: Self-pay | Source: Ambulatory Visit | Attending: Surgery

## 2014-02-24 DIAGNOSIS — E119 Type 2 diabetes mellitus without complications: Secondary | ICD-10-CM | POA: Insufficient documentation

## 2014-02-24 DIAGNOSIS — F329 Major depressive disorder, single episode, unspecified: Secondary | ICD-10-CM | POA: Diagnosis not present

## 2014-02-24 DIAGNOSIS — Z881 Allergy status to other antibiotic agents status: Secondary | ICD-10-CM | POA: Insufficient documentation

## 2014-02-24 DIAGNOSIS — Z6831 Body mass index (BMI) 31.0-31.9, adult: Secondary | ICD-10-CM | POA: Diagnosis not present

## 2014-02-24 DIAGNOSIS — I10 Essential (primary) hypertension: Secondary | ICD-10-CM | POA: Insufficient documentation

## 2014-02-24 DIAGNOSIS — J449 Chronic obstructive pulmonary disease, unspecified: Secondary | ICD-10-CM | POA: Diagnosis not present

## 2014-02-24 DIAGNOSIS — K219 Gastro-esophageal reflux disease without esophagitis: Secondary | ICD-10-CM | POA: Diagnosis not present

## 2014-02-24 DIAGNOSIS — K8010 Calculus of gallbladder with chronic cholecystitis without obstruction: Secondary | ICD-10-CM | POA: Insufficient documentation

## 2014-02-24 DIAGNOSIS — K8020 Calculus of gallbladder without cholecystitis without obstruction: Secondary | ICD-10-CM | POA: Diagnosis present

## 2014-02-24 HISTORY — DX: Presence of dental prosthetic device (complete) (partial): Z97.2

## 2014-02-24 HISTORY — PX: CHOLECYSTECTOMY: SHX55

## 2014-02-24 LAB — GLUCOSE, CAPILLARY
Glucose-Capillary: 91 mg/dL (ref 70–99)
Glucose-Capillary: 140 mg/dL — ABNORMAL HIGH (ref 70–99)

## 2014-02-24 SURGERY — LAPAROSCOPIC CHOLECYSTECTOMY
Anesthesia: General | Site: Abdomen

## 2014-02-24 MED ORDER — PHENYLEPHRINE HCL 10 MG/ML IJ SOLN
INTRAMUSCULAR | Status: DC | PRN
  Administered 2014-02-24 (×2): 80 ug via INTRAVENOUS

## 2014-02-24 MED ORDER — ONDANSETRON HCL 4 MG/2ML IJ SOLN
INTRAMUSCULAR | Status: DC | PRN
  Administered 2014-02-24: 4 mg via INTRAVENOUS

## 2014-02-24 MED ORDER — BUPIVACAINE-EPINEPHRINE (PF) 0.5% -1:200000 IJ SOLN
INTRAMUSCULAR | Status: AC
  Filled 2014-02-24: qty 30

## 2014-02-24 MED ORDER — DEXAMETHASONE SODIUM PHOSPHATE 4 MG/ML IJ SOLN
INTRAMUSCULAR | Status: DC | PRN
  Administered 2014-02-24: 10 mg via INTRAVENOUS

## 2014-02-24 MED ORDER — PROPOFOL 10 MG/ML IV BOLUS
INTRAVENOUS | Status: AC
  Filled 2014-02-24: qty 80

## 2014-02-24 MED ORDER — ONDANSETRON HCL 4 MG/2ML IJ SOLN: 4.0000 mg | Freq: Once | INTRAMUSCULAR | Status: DC | PRN

## 2014-02-24 MED ORDER — ACETAMINOPHEN 325 MG PO TABS: 650.0000 mg | ORAL_TABLET | ORAL | Status: DC | PRN

## 2014-02-24 MED ORDER — PROPOFOL 10 MG/ML IV EMUL
INTRAVENOUS | Status: AC
  Filled 2014-02-24: qty 100

## 2014-02-24 MED ORDER — OXYCODONE HCL 5 MG PO TABS: 5.0000 mg | ORAL_TABLET | ORAL | Status: DC | PRN

## 2014-02-24 MED ORDER — LIDOCAINE HCL (CARDIAC) 20 MG/ML IV SOLN
INTRAVENOUS | Status: DC | PRN
  Administered 2014-02-24: 100 mg via INTRAVENOUS

## 2014-02-24 MED ORDER — DEXTROSE 5 % IV SOLN: 3.0000 g | INTRAVENOUS | Status: DC

## 2014-02-24 MED ORDER — SODIUM CHLORIDE 0.9 % IR SOLN
Status: DC | PRN
  Administered 2014-02-24: 1

## 2014-02-24 MED ORDER — FENTANYL CITRATE 0.05 MG/ML IJ SOLN: 50.0000 ug | INTRAMUSCULAR | Status: DC | PRN

## 2014-02-24 MED ORDER — ALBUTEROL SULFATE HFA 108 (90 BASE) MCG/ACT IN AERS
INHALATION_SPRAY | RESPIRATORY_TRACT | Status: DC | PRN
  Administered 2014-02-24 (×2): 2 via RESPIRATORY_TRACT

## 2014-02-24 MED ORDER — OXYCODONE HCL 5 MG PO TABS: 5.0000 mg | ORAL_TABLET | Freq: Once | ORAL | Status: DC | PRN

## 2014-02-24 MED ORDER — HYDROMORPHONE HCL 1 MG/ML IJ SOLN
INTRAMUSCULAR | Status: AC
  Filled 2014-02-24: qty 1

## 2014-02-24 MED ORDER — SODIUM CHLORIDE 0.9 % IJ SOLN: 3.0000 mL | Freq: Two times a day (BID) | INTRAMUSCULAR | Status: DC

## 2014-02-24 MED ORDER — LACTATED RINGERS IV SOLN
INTRAVENOUS | Status: DC
  Administered 2014-02-24: 10:00:00 via INTRAVENOUS

## 2014-02-24 MED ORDER — MORPHINE SULFATE 2 MG/ML IJ SOLN: 1.0000 mg | INTRAMUSCULAR | Status: DC | PRN

## 2014-02-24 MED ORDER — DEXTROSE 5 % IV SOLN
3.0000 g | INTRAVENOUS | Status: DC | PRN
  Administered 2014-02-24: 3 g via INTRAVENOUS

## 2014-02-24 MED ORDER — OXYCODONE HCL 5 MG/5ML PO SOLN: 5.0000 mg | Freq: Once | ORAL | Status: DC | PRN

## 2014-02-24 MED ORDER — MIDAZOLAM HCL 2 MG/2ML IJ SOLN: 1.0000 mg | INTRAMUSCULAR | Status: DC | PRN

## 2014-02-24 MED ORDER — SUCCINYLCHOLINE CHLORIDE 20 MG/ML IJ SOLN
INTRAMUSCULAR | Status: DC | PRN
  Administered 2014-02-24: 50 mg via INTRAVENOUS

## 2014-02-24 MED ORDER — SODIUM CHLORIDE 0.9 % IV SOLN: 250.0000 mL | INTRAVENOUS | Status: DC | PRN

## 2014-02-24 MED ORDER — FENTANYL CITRATE 0.05 MG/ML IJ SOLN
INTRAMUSCULAR | Status: AC
  Filled 2014-02-24: qty 6

## 2014-02-24 MED ORDER — HYDROMORPHONE HCL 1 MG/ML IJ SOLN
0.2500 mg | INTRAMUSCULAR | Status: DC | PRN
  Administered 2014-02-24 (×3): 0.5 mg via INTRAVENOUS

## 2014-02-24 MED ORDER — SODIUM CHLORIDE 0.9 % IJ SOLN: 3.0000 mL | INTRAMUSCULAR | Status: DC | PRN

## 2014-02-24 MED ORDER — ACETAMINOPHEN 650 MG RE SUPP: 650.0000 mg | RECTAL | Status: DC | PRN

## 2014-02-24 MED ORDER — FENTANYL CITRATE 0.05 MG/ML IJ SOLN
INTRAMUSCULAR | Status: DC | PRN
  Administered 2014-02-24: 100 ug via INTRAVENOUS

## 2014-02-24 MED ORDER — CEFAZOLIN SODIUM 1-5 GM-% IV SOLN
INTRAVENOUS | Status: AC
  Filled 2014-02-24: qty 50

## 2014-02-24 MED ORDER — BUPIVACAINE-EPINEPHRINE (PF) 0.5% -1:200000 IJ SOLN
INTRAMUSCULAR | Status: DC | PRN
  Administered 2014-02-24: 20 mL

## 2014-02-24 MED ORDER — CEFAZOLIN SODIUM-DEXTROSE 2-3 GM-% IV SOLR
INTRAVENOUS | Status: AC
  Filled 2014-02-24: qty 50

## 2014-02-24 MED ORDER — OXYCODONE-ACETAMINOPHEN 10-325 MG PO TABS: 1.0000 | ORAL_TABLET | ORAL | Status: DC | PRN

## 2014-02-24 MED ORDER — PROPOFOL 10 MG/ML IV BOLUS
INTRAVENOUS | Status: DC | PRN
  Administered 2014-02-24 (×2): 200 mg via INTRAVENOUS

## 2014-02-24 MED ORDER — SUCCINYLCHOLINE CHLORIDE 20 MG/ML IJ SOLN
INTRAMUSCULAR | Status: AC
  Filled 2014-02-24: qty 3

## 2014-02-24 SURGICAL SUPPLY — 49 items
APL SKNCLS STERI-STRIP NONHPOA (GAUZE/BANDAGES/DRESSINGS) ×2
APPLIER CLIP 5 13 M/L LIGAMAX5 (MISCELLANEOUS) ×4
APR CLP MED LRG 5 ANG JAW (MISCELLANEOUS) ×2
BAG SPEC RTRVL LRG 6X4 10 (ENDOMECHANICALS) ×2
BANDAGE ADH SHEER 1  50/CT (GAUZE/BANDAGES/DRESSINGS) ×16 IMPLANT
BENZOIN TINCTURE PRP APPL 2/3 (GAUZE/BANDAGES/DRESSINGS) ×4 IMPLANT
BLADE CLIPPER SURG (BLADE) ×3 IMPLANT
CANISTER SUCT 3000ML (MISCELLANEOUS) ×4 IMPLANT
CHLORAPREP W/TINT 26ML (MISCELLANEOUS) ×4 IMPLANT
CLIP APPLIE 5 13 M/L LIGAMAX5 (MISCELLANEOUS) ×2 IMPLANT
CLOSURE WOUND 1/2 X4 (GAUZE/BANDAGES/DRESSINGS) ×1
COVER MAYO STAND STRL (DRAPES) IMPLANT
DECANTER SPIKE VIAL GLASS SM (MISCELLANEOUS) IMPLANT
DRAPE C-ARM 42X72 X-RAY (DRAPES) IMPLANT
ELECT REM PT RETURN 9FT ADLT (ELECTROSURGICAL) ×4
ELECTRODE REM PT RTRN 9FT ADLT (ELECTROSURGICAL) ×2 IMPLANT
FILTER SMOKE EVAC LAPAROSHD (FILTER) ×1 IMPLANT
GLOVE BIO SURGEON STRL SZ7 (GLOVE) ×3 IMPLANT
GLOVE BIO SURGEON STRL SZ7.5 (GLOVE) ×4 IMPLANT
GLOVE BIOGEL PI IND STRL 7.5 (GLOVE) ×1 IMPLANT
GLOVE BIOGEL PI IND STRL 8 (GLOVE) ×1 IMPLANT
GLOVE BIOGEL PI INDICATOR 7.5 (GLOVE) ×2
GLOVE BIOGEL PI INDICATOR 8 (GLOVE) ×2
GLOVE EXAM NITRILE MD LF STRL (GLOVE) ×6 IMPLANT
GLOVE SURG SIGNA 7.5 PF LTX (GLOVE) ×4 IMPLANT
GOWN STRL REUS W/ TWL LRG LVL3 (GOWN DISPOSABLE) ×2 IMPLANT
GOWN STRL REUS W/ TWL XL LVL3 (GOWN DISPOSABLE) ×2 IMPLANT
GOWN STRL REUS W/TWL LRG LVL3 (GOWN DISPOSABLE) ×4
GOWN STRL REUS W/TWL XL LVL3 (GOWN DISPOSABLE) ×8
HEMOSTAT SNOW SURGICEL 2X4 (HEMOSTASIS) ×4 IMPLANT
NS IRRIG 1000ML POUR BTL (IV SOLUTION) ×4 IMPLANT
PACK BASIN DAY SURGERY FS (CUSTOM PROCEDURE TRAY) ×4 IMPLANT
POUCH SPECIMEN RETRIEVAL 10MM (ENDOMECHANICALS) ×4 IMPLANT
SCISSORS LAP 5X35 DISP (ENDOMECHANICALS) IMPLANT
SET CHOLANGIOGRAPH 5 50 .035 (SET/KITS/TRAYS/PACK) IMPLANT
SET IRRIG TUBING LAPAROSCOPIC (IRRIGATION / IRRIGATOR) ×4 IMPLANT
SLEEVE ENDOPATH XCEL 5M (ENDOMECHANICALS) ×8 IMPLANT
SLEEVE SCD COMPRESS KNEE MED (MISCELLANEOUS) ×4 IMPLANT
SPECIMEN JAR SMALL (MISCELLANEOUS) ×4 IMPLANT
STRIP CLOSURE SKIN 1/2X4 (GAUZE/BANDAGES/DRESSINGS) ×2 IMPLANT
SUT MON AB 4-0 PC3 18 (SUTURE) ×4 IMPLANT
SUT VICRYL 0 UR6 27IN ABS (SUTURE) IMPLANT
TOWEL OR 17X24 6PK STRL BLUE (TOWEL DISPOSABLE) ×4 IMPLANT
TRAY LAPAROSCOPIC (CUSTOM PROCEDURE TRAY) ×4 IMPLANT
TROCAR XCEL BLUNT TIP 100MML (ENDOMECHANICALS) ×4 IMPLANT
TROCAR XCEL NON-BLD 5MMX100MML (ENDOMECHANICALS) ×4 IMPLANT
TUBE CONNECTING 20'X1/4 (TUBING) ×1
TUBE CONNECTING 20X1/4 (TUBING) ×3 IMPLANT
TUBING INSUFFLATION (TUBING) ×4 IMPLANT

## 2014-02-24 NOTE — Anesthesia Procedure Notes (Signed)
Procedure Name: Intubation Date/Time: 02/24/2014 8:31 AM Performed by: Marrianne Mood Pre-anesthesia Checklist: Patient identified, Emergency Drugs available, Suction available, Patient being monitored and Timeout performed Patient Re-evaluated:Patient Re-evaluated prior to inductionOxygen Delivery Method: Circle System Utilized Preoxygenation: Pre-oxygenation with 100% oxygen Intubation Type: IV induction Ventilation: Mask ventilation without difficulty Tube type: Oral Tube size: 8.0 mm Number of attempts: 1 Airway Equipment and Method: stylet and oral airway Placement Confirmation: ETT inserted through vocal cords under direct vision,  positive ETCO2 and breath sounds checked- equal and bilateral Secured at: 26 cm Tube secured with: Tape Dental Injury: Teeth and Oropharynx as per pre-operative assessment

## 2014-02-24 NOTE — Anesthesia Postprocedure Evaluation (Signed)
  Anesthesia Post-op Note  Patient: Jonathan [name]  Procedure(s) Performed: Procedure(s): LAPAROSCOPIC CHOLECYSTECTOMY (N/A)  Patient Location: PACU  Anesthesia Type: General   Level of Consciousness: awake, alert  and oriented  Airway and Oxygen Therapy: Patient Spontanous Breathing  Post-op Pain: mild  Post-op Assessment: Post-op Vital signs reviewed  Post-op Vital Signs: Reviewed  Last Vitals:  Filed Vitals:   02/24/14 1115  BP: 126/80  Pulse: 81  Temp:   Resp: 13    Complications: No apparent anesthesia complications

## 2014-02-24 NOTE — Op Note (Signed)
Laparoscopic Cholecystectomy Procedure Note  Indications: This patient presents with symptomatic gallbladder disease and will undergo laparoscopic cholecystectomy.  Pre-operative Diagnosis: Calculus of gallbladder without mention of cholecystitis or obstruction  Post-operative Diagnosis: Same  Surgeon: Coralie Keens A   Assistants: 0  Anesthesia: General endotracheal anesthesia  ASA Class: 2  Procedure Details  The patient was seen again in the Holding Room. The risks, benefits, complications, treatment options, and expected outcomes were discussed with the patient. The possibilities of reaction to medication, pulmonary aspiration, perforation of viscus, bleeding, recurrent infection, finding a normal gallbladder, the need for additional procedures, failure to diagnose a condition, the possible need to convert to an open procedure, and creating a complication requiring transfusion or operation were discussed with the patient. The likelihood of improving the patient's symptoms with return to their baseline status is good.  The patient and/or family concurred with the proposed plan, giving informed consent. The site of surgery properly noted. The patient was taken to Operating Room, identified as Jonathan [name] and the procedure verified as Laparoscopic Cholecystectomy with Intraoperative Cholangiogram. A Time Out was held and the above information confirmed.  Prior to the induction of general anesthesia, antibiotic prophylaxis was administered. General endotracheal anesthesia was then administered and tolerated well. After the induction, the abdomen was prepped with Chloraprep and draped in sterile fashion. The patient was positioned in the supine position.  Local anesthetic agent was injected into the skin near the umbilicus and an incision made. We dissected down to the abdominal fascia with blunt dissection.  The fascia was incised vertically and we entered the peritoneal cavity bluntly.   A pursestring suture of 0-Vicryl was placed around the fascial opening.  The Hasson cannula was inserted and secured with the stay suture.  Pneumoperitoneum was then created with CO2 and tolerated well without any adverse changes in the patient's vital signs. An 5-mm port was placed in the subxiphoid position.  Two 5-mm ports were placed in the right upper quadrant. All skin incisions were infiltrated with a local anesthetic agent before making the incision and placing the trocars.   We positioned the patient in reverse Trendelenburg, tilted slightly to the patient's left.  The gallbladder was identified, the fundus grasped and retracted cephalad. Adhesions were lysed bluntly and with the electrocautery where indicated, taking care not to injure any adjacent organs or viscus. The infundibulum was grasped and retracted laterally, exposing the peritoneum overlying the triangle of Calot. This was then divided and exposed in a blunt fashion. The cystic duct was clearly identified and bluntly dissected circumferentially. A critical view of the cystic duct and cystic artery was obtained.  The cystic duct was then ligated with clips and divided. The cystic artery was, dissected free, ligated with clips and divided as well.   The gallbladder was dissected from the liver bed in retrograde fashion with the electrocautery. The gallbladder was removed and placed in an Endocatch sac. The liver bed was irrigated and inspected. Hemostasis was achieved with the electrocautery. Copious irrigation was utilized and was repeatedly aspirated until clear.  The gallbladder and Endocatch sac were then removed through the umbilical port site.  The pursestring suture was used to close the umbilical fascia.    We again inspected the right upper quadrant for hemostasis.  Pneumoperitoneum was released as we removed the trocars.  4-0 Monocryl was used to close the skin.   Benzoin, steri-strips, and clean dressings were applied. The  patient was then extubated and brought to the recovery room  in stable condition. Instrument, sponge, and needle counts were correct at closure and at the conclusion of the case.   Findings: Cholecystitis with Cholelithiasis  Estimated Blood Loss: Minimal         Drains: 0         Specimens: Gallbladder           Complications: None; patient tolerated the procedure well.         Disposition: PACU - hemodynamically stable.         Condition: stable

## 2014-02-24 NOTE — Interval H&P Note (Signed)
History and Physical Interval Note: no change in H and P  02/24/2014 7:59 AM  Jonathan [name]  has presented today for surgery, with the diagnosis of Gallstones  The various methods of treatment have been discussed with the patient and family. After consideration of risks, benefits and other options for treatment, the patient has consented to  Procedure(s): LAPAROSCOPIC CHOLECYSTECTOMY WITH INTRAOPERATIVE CHOLANGIOGRAM (N/A) as a surgical intervention .  The patient's history has been reviewed, patient examined, no change in status, stable for surgery.  I have reviewed the patient's chart and labs.  Questions were answered to the patient's satisfaction.     Marlys Stegmaier A

## 2014-02-24 NOTE — Transfer of Care (Signed)
Immediate Anesthesia Transfer of Care Note  Patient: Jonathan [name]  Procedure(s) Performed: Procedure(s): LAPAROSCOPIC CHOLECYSTECTOMY (N/A)  Patient Location: PACU  Anesthesia Type:General  Level of Consciousness: sedated  Airway & Oxygen Therapy: Patient Spontanous Breathing and Patient connected to face mask oxygen  Post-op Assessment: Report given to PACU RN and Post -op Vital signs reviewed and stable  Post vital signs: Reviewed and stable  Complications: No apparent anesthesia complications

## 2014-02-24 NOTE — Discharge Instructions (Signed)
[facility] ______[facility] °LAPAROSCOPIC SURGERY: POST OP INSTRUCTIONS °Always review your discharge instruction sheet given to you by the facility where your surgery was performed. °IF YOU HAVE DISABILITY OR FAMILY LEAVE FORMS, YOU MUST BRING THEM TO THE OFFICE FOR PROCESSING.   °DO NOT GIVE THEM TO YOUR DOCTOR. ° °1. A prescription for pain medication may be given to you upon discharge.  Take your pain medication as prescribed, if needed.  If narcotic pain medicine is not needed, then you may take acetaminophen (Tylenol) or ibuprofen (Advil) as needed. °2. Take your usually prescribed medications unless otherwise directed. °3. If you need a refill on your pain medication, please contact your pharmacy.  They will contact our office to request authorization. Prescriptions will not be filled after 5pm or on week-ends. °4. You should follow a light diet the first few days after arrival home, such as soup and crackers, etc.  Be sure to include lots of fluids daily. °5. Most patients will experience some swelling and bruising in the area of the incisions.  Ice packs will help.  Swelling and bruising can take several days to resolve.  °6. It is common to experience some constipation if taking pain medication after surgery.  Increasing fluid intake and taking a stool softener (such as Colace) will usually help or prevent this problem from occurring.  A mild laxative (Milk of Magnesia or Miralax) should be taken according to package instructions if there are no bowel movements after 48 hours. °7. Unless discharge instructions indicate otherwise, you may remove your bandages 24-48 hours after surgery, and you may shower at that time.  You may have steri-strips (small skin tapes) in place directly over the incision.  These strips should be left on the skin for 7-10 days.  If your surgeon used skin glue on the incision, you may shower in 24 hours.  The glue will flake off over the next 2-3 weeks.  Any sutures or  staples will be removed at the office during your follow-up visit. °8. ACTIVITIES:  You may resume regular (light) daily activities beginning the next day--such as daily self-care, walking, climbing stairs--gradually increasing activities as tolerated.  You may have sexual intercourse when it is comfortable.  Refrain from any heavy lifting or straining until approved by your doctor. °a. You may drive when you are no longer taking prescription pain medication, you can comfortably wear a seatbelt, and you can safely maneuver your car and apply brakes. °b. RETURN TO WORK:  __________________________________________________________ °9. You should see your doctor in the office for a follow-up appointment approximately 2-3 weeks after your surgery.  Make sure that you call for this appointment within a day or two after you arrive home to insure a convenient appointment time. °10. OTHER INSTRUCTIONS: __________________________________________________________________________________________________________________________ __________________________________________________________________________________________________________________________ °WHEN TO CALL YOUR DOCTOR: °1. Fever over 101.0 °2. Inability to urinate °3. Continued bleeding from incision. °4. Increased pain, redness, or drainage from the incision. °5. Increasing abdominal pain ° °The clinic staff is available to answer your questions during regular business hours.  Please don’t hesitate to call and ask to speak to one of the nurses for clinical concerns.  If you have a medical emergency, go to the nearest emergency room or call 911.  A surgeon from [facility] is always on call at the hospital. °[address], [address], [place], [place]  [address] ? [address] [address], [place], [place]   [address] °[phone] ? [phone] ? FAX [phone] °Web site:   [email or web address]   Post Anesthesia Home Care Instructions  Activity: Get  plenty of rest for the remainder of the day. A responsible adult should stay with you for 24 hours following the procedure.  For the next 24 hours, DO NOT: -Drive a car -Paediatric nurse -Drink alcoholic beverages -Take any medication unless instructed by your physician -Make any legal decisions or sign important papers.  Meals: Start with liquid foods such as gelatin or soup. Progress to regular foods as tolerated. Avoid greasy, spicy, heavy foods. If nausea and/or vomiting occur, drink only clear liquids until the nausea and/or vomiting subsides. Call your physician if vomiting continues.  Special Instructions/Symptoms: Your throat may feel dry or sore from the anesthesia or the breathing tube placed in your throat during surgery. If this causes discomfort, gargle with warm salt water. The discomfort should disappear within 24 hours.

## 2014-02-24 NOTE — Anesthesia Preprocedure Evaluation (Addendum)
Anesthesia Evaluation  Patient identified by MRN, date of birth, ID band  Reviewed: Allergy & Precautions, H&P , NPO status , Patient's Chart, lab work & pertinent test results  Airway Mallampati: I  TM Distance: >3 FB Neck ROM: Full    Dental  (+) Missing, Teeth Intact, Dental Advisory Given   Pulmonary COPDCurrent Smoker,  breath sounds clear to auscultation        Cardiovascular hypertension, Pt. on medications Rhythm:Regular Rate:Normal     Neuro/Psych    GI/Hepatic GERD-  Medicated,  Endo/Other  diabetes, Well Controlled, Type 2, Oral Hypoglycemic AgentsMorbid obesity  Renal/GU      Musculoskeletal   Abdominal   Peds  Hematology   Anesthesia Other Findings   Reproductive/Obstetrics                            Anesthesia Physical Anesthesia Plan  ASA: III  Anesthesia Plan: General   Post-op Pain Management:    Induction: Intravenous  Airway Management Planned: Oral ETT  Additional Equipment:   Intra-op Plan:   Post-operative Plan: Extubation in OR  Informed Consent: I have reviewed the patients History and Physical, chart, labs and discussed the procedure including the risks, benefits and alternatives for the proposed anesthesia with the patient or authorized representative who has indicated his/her understanding and acceptance.   Dental advisory given  Plan Discussed with: CRNA, Anesthesiologist and Surgeon  Anesthesia Plan Comments:         Anesthesia Quick Evaluation

## 2014-02-26 ENCOUNTER — Encounter (HOSPITAL_BASED_OUTPATIENT_CLINIC_OR_DEPARTMENT_OTHER): Payer: Self-pay | Admitting: Surgery

## 2014-03-02 ENCOUNTER — Emergency Department (HOSPITAL_COMMUNITY)
Admission: EM | Admit: 2014-03-02 | Discharge: 2014-03-02 | Disposition: A | Discharge status: Home / Self Care | Payer: Medicare Other | Attending: Emergency Medicine | Admitting: Emergency Medicine

## 2014-03-02 ENCOUNTER — Encounter (HOSPITAL_COMMUNITY): Payer: Self-pay | Admitting: Emergency Medicine

## 2014-03-02 ENCOUNTER — Emergency Department (HOSPITAL_COMMUNITY): Payer: Medicare Other

## 2014-03-02 DIAGNOSIS — Z720 Tobacco use: Secondary | ICD-10-CM | POA: Insufficient documentation

## 2014-03-02 DIAGNOSIS — E119 Type 2 diabetes mellitus without complications: Secondary | ICD-10-CM | POA: Diagnosis not present

## 2014-03-02 DIAGNOSIS — Z87442 Personal history of urinary calculi: Secondary | ICD-10-CM | POA: Insufficient documentation

## 2014-03-02 DIAGNOSIS — Z79899 Other long term (current) drug therapy: Secondary | ICD-10-CM | POA: Insufficient documentation

## 2014-03-02 DIAGNOSIS — I10 Essential (primary) hypertension: Secondary | ICD-10-CM | POA: Insufficient documentation

## 2014-03-02 DIAGNOSIS — Z87440 Personal history of urinary (tract) infections: Secondary | ICD-10-CM | POA: Diagnosis not present

## 2014-03-02 DIAGNOSIS — Z98811 Dental restoration status: Secondary | ICD-10-CM | POA: Diagnosis not present

## 2014-03-02 DIAGNOSIS — G8918 Other acute postprocedural pain: Secondary | ICD-10-CM | POA: Diagnosis not present

## 2014-03-02 DIAGNOSIS — Z8701 Personal history of pneumonia (recurrent): Secondary | ICD-10-CM | POA: Diagnosis not present

## 2014-03-02 DIAGNOSIS — N529 Male erectile dysfunction, unspecified: Secondary | ICD-10-CM | POA: Insufficient documentation

## 2014-03-02 DIAGNOSIS — G629 Polyneuropathy, unspecified: Secondary | ICD-10-CM | POA: Insufficient documentation

## 2014-03-02 DIAGNOSIS — Z7952 Long term (current) use of systemic steroids: Secondary | ICD-10-CM | POA: Diagnosis not present

## 2014-03-02 DIAGNOSIS — R1013 Epigastric pain: Secondary | ICD-10-CM | POA: Diagnosis not present

## 2014-03-02 DIAGNOSIS — J449 Chronic obstructive pulmonary disease, unspecified: Secondary | ICD-10-CM | POA: Diagnosis not present

## 2014-03-02 DIAGNOSIS — E669 Obesity, unspecified: Secondary | ICD-10-CM | POA: Insufficient documentation

## 2014-03-02 DIAGNOSIS — M1990 Unspecified osteoarthritis, unspecified site: Secondary | ICD-10-CM | POA: Insufficient documentation

## 2014-03-02 DIAGNOSIS — K219 Gastro-esophageal reflux disease without esophagitis: Secondary | ICD-10-CM | POA: Insufficient documentation

## 2014-03-02 DIAGNOSIS — R109 Unspecified abdominal pain: Secondary | ICD-10-CM

## 2014-03-02 DIAGNOSIS — R079 Chest pain, unspecified: Secondary | ICD-10-CM | POA: Insufficient documentation

## 2014-03-02 LAB — BASIC METABOLIC PANEL
Anion gap: 14 (ref 5–15)
BUN: 13 mg/dL (ref 6–23)
CO2: 26 mEq/L (ref 19–32)
Calcium: 9.1 mg/dL (ref 8.4–10.5)
Chloride: 92 mEq/L — ABNORMAL LOW (ref 96–112)
Creatinine, Ser: 1.05 mg/dL (ref 0.50–1.35)
GFR calc Af Amer: 80 mL/min — ABNORMAL LOW (ref >90)
GFR calc non Af Amer: 69 mL/min — ABNORMAL LOW (ref >90)
Glucose, Bld: 176 mg/dL — ABNORMAL HIGH (ref 70–99)
Potassium: 5.3 mEq/L (ref 3.7–5.3)
Sodium: 132 mEq/L — ABNORMAL LOW (ref 137–147)

## 2014-03-02 LAB — URINE MICROSCOPIC-ADD ON

## 2014-03-02 LAB — HEPATIC FUNCTION PANEL
ALT: 30 U/L (ref 0–53)
AST: 37 U/L (ref 0–37)
Albumin: 3.3 g/dL — ABNORMAL LOW (ref 3.5–5.2)
Alkaline Phosphatase: 44 U/L (ref 39–117)
Bilirubin, Direct: <0.2 mg/dL (ref 0.0–0.3)
Total Bilirubin: 0.7 mg/dL (ref 0.3–1.2)
Total Protein: 7.5 g/dL (ref 6.0–8.3)

## 2014-03-02 LAB — URINALYSIS, ROUTINE W REFLEX MICROSCOPIC
Bilirubin Urine: NEGATIVE
Glucose, UA: NEGATIVE mg/dL
Hgb urine dipstick: NEGATIVE
Ketones, ur: NEGATIVE mg/dL
Nitrite: NEGATIVE
Protein, ur: NEGATIVE mg/dL
Specific Gravity, Urine: 1.018 (ref 1.005–1.030)
Urobilinogen, UA: 0.2 mg/dL (ref 0.0–1.0)
pH: 6.5 (ref 5.0–8.0)

## 2014-03-02 LAB — CBC WITH DIFFERENTIAL/PLATELET
Basophils Absolute: 0 10*3/uL (ref 0.0–0.1)
Basophils Relative: 0 % (ref 0–1)
Eosinophils Absolute: 0.2 10*3/uL (ref 0.0–0.7)
Eosinophils Relative: 2 % (ref 0–5)
HCT: 43.9 % (ref 39.0–52.0)
Hemoglobin: 15.1 g/dL (ref 13.0–17.0)
Lymphocytes Relative: 21 % (ref 12–46)
Lymphs Abs: 1.7 10*3/uL (ref 0.7–4.0)
MCH: 32.3 pg (ref 26.0–34.0)
MCHC: 34.4 g/dL (ref 30.0–36.0)
MCV: 94 fL (ref 78.0–100.0)
Monocytes Absolute: 0.5 10*3/uL (ref 0.1–1.0)
Monocytes Relative: 6 % (ref 3–12)
Neutro Abs: 5.7 10*3/uL (ref 1.7–7.7)
Neutrophils Relative %: 71 % (ref 43–77)
Platelets: 154 10*3/uL (ref 150–400)
RBC: 4.67 MIL/uL (ref 4.22–5.81)
RDW: 14 % (ref 11.5–15.5)
WBC: 8.1 10*3/uL (ref 4.0–10.5)

## 2014-03-02 LAB — I-STAT TROPONIN, ED: Troponin i, poc: 0.01 ng/mL (ref 0.00–0.08)

## 2014-03-02 LAB — TROPONIN I: Troponin I: <0.3 ng/mL (ref <0.30)

## 2014-03-02 LAB — PRO B NATRIURETIC PEPTIDE: Pro B Natriuretic peptide (BNP): 6.8 pg/mL (ref 0–125)

## 2014-03-02 LAB — LIPASE, BLOOD: Lipase: 43 U/L (ref 11–59)

## 2014-03-02 LAB — CBG MONITORING, ED: Glucose-Capillary: 178 mg/dL — ABNORMAL HIGH (ref 70–99)

## 2014-03-02 NOTE — Discharge Instructions (Signed)

## 2014-03-02 NOTE — ED Provider Notes (Signed)
CSN: [id number]     Arrival date & time 03/02/14  1114 History   First MD Initiated Contact with Patient 03/02/14 1129     Chief Complaint  Patient presents with  . Hyperglycemia  . Chest Pain  . Abdominal Pain     (Consider location/radiation/quality/duration/timing/severity/associated sxs/prior Treatment) HPI  The patient reports that he has epigastric bloating and discomfort. He reports he had his gallbladder removed approximately a week ago. He denies vomiting but does report he is felt nauseated. He reports he feels like he is distended in his upper abdomen. The patient reports that last night he was having trouble controlling his blood sugars. He reports today ranged from 80 up to 150 all throughout the night. The patient reports that if his blood sugar goes below 100 he feels really bad. Chest pressure is identified in the triage note. However it does appear this is more of a epigastric distention sensation. The patient poor she has multiple chronic medical problems including neuropathy and diabetes.  Past Medical History  Diagnosis Date  . Nicotine addiction   . Obesity   . Dyslipidemia   . Hypertension   . Elevated WBCs September 2011    and UTI Hospitalized   . Erectile dysfunction   . Arthritis   . Polyneuropathy in other diseases classified elsewhere 12/05/2012  . Lesion of ulnar nerve 12/05/2012    Right side  . Gait disorder   . COPD (chronic obstructive pulmonary disease)     normally have breathing tx prior to surgery  . Pneumonia     hx  . History of kidney stones   . GERD (gastroesophageal reflux disease)   . Neuropathy   . Vertigo   . Diabetes mellitus without complication     bordrline no med  . Wears dentures     bottom   Past Surgical History  Procedure Laterality Date  . Bladder surgery  02/2011    For Diverticulum of bladder removed  . Colon polyp resection    . Cysto    . Cholecystectomy N/A 02/24/2014    Procedure: LAPAROSCOPIC CHOLECYSTECTOMY;   Surgeon: Coralie Keens, MD;  Location: [facility];  Service: General;  Laterality: N/A;   Family History  Problem Relation Age of Onset  . Cancer Father    History  Substance Use Topics  . Smoking status: Current Every Day Smoker -- 0.50 packs/day for 35 years    Types: Cigarettes  . Smokeless tobacco: Never Used  . Alcohol Use: No    Review of Systems 10 Systems reviewed and are negative for acute change except as noted in the HPI.    Allergies  Ciprofloxacin and Levaquin  Home Medications   Prior to Admission medications   Medication Sig Start Date End Date Taking? Authorizing Provider  atenolol-chlorthalidone (TENORETIC) 50-25 MG per tablet Take 1 tablet by mouth at bedtime.    Yes Historical Provider, MD  Chlorpheniramine-APAP (CORICIDIN) 2-325 MG TABS Take 1 tablet by mouth daily.   Yes Historical Provider, MD  Cyanocobalamin (VITAMIN B-12 PO) Take 1 tablet by mouth daily.   Yes Historical Provider, MD  esomeprazole (NEXIUM) 40 MG capsule Take 40 mg by mouth daily at 12 noon.   Yes Historical Provider, MD  gabapentin (NEURONTIN) 300 MG capsule Take 300 mg by mouth at bedtime.   Yes Historical Provider, MD  gabapentin (NEURONTIN) 400 MG capsule Take 400 mg by mouth 2 (two) times daily.    Yes Historical Provider, MD  ipratropium-albuterol (  DUONEB) 0.5-2.5 (3) MG/3ML SOLN Take 3 mLs by nebulization 3 (three) times daily as needed (wheezing).    Yes Historical Provider, MD  loratadine-pseudoephedrine (CLARITIN-D 12-HOUR) 5-120 MG per tablet Take 1 tablet by mouth 2 (two) times daily.   Yes Historical Provider, MD  meclizine (ANTIVERT) 50 MG tablet Take 50 mg by mouth 2 (two) times daily as needed for dizziness.    Yes Historical Provider, MD  metFORMIN (GLUCOPHAGE) 500 MG tablet Take by mouth 2 (two) times daily with a meal.   Yes Historical Provider, MD  naproxen (NAPROSYN) 500 MG tablet Take 500 mg by mouth daily as needed (pain).   Yes Historical Provider,  MD  nitroGLYCERIN (NITROSTAT) 0.4 MG SL tablet Place 0.4 mg under the tongue every 5 (five) minutes as needed for chest pain.   Yes Historical Provider, MD  oxyCODONE-acetaminophen (PERCOCET) 10-325 MG per tablet Take 1 tablet by mouth every 4 (four) hours as needed for pain. 02/24/14  Yes Coralie Keens, MD  pantoprazole (PROTONIX) 40 MG tablet Take 40 mg by mouth daily.   Yes Historical Provider, MD  polyethylene glycol (MIRALAX / GLYCOLAX) packet Take 17 g by mouth daily as needed for mild constipation.   Yes Historical Provider, MD  predniSONE (DELTASONE) 10 MG tablet Take 20 mg by mouth daily.    Yes Historical Provider, MD  ranitidine (ZANTAC) 150 MG tablet Take 150 mg by mouth 2 (two) times daily.   Yes Historical Provider, MD  tamsulosin (FLOMAX) 0.4 MG CAPS capsule Take 0.4 mg by mouth 2 (two) times daily.   Yes Historical Provider, MD  tiotropium (SPIRIVA) 18 MCG inhalation capsule Place 18 mcg into inhaler and inhale daily.     Yes Historical Provider, MD  sildenafil (VIAGRA) 100 MG tablet Take 100 mg by mouth daily as needed for erectile dysfunction. One tablet 30 minutes before intercourse, maximum twice weekly    Historical Provider, MD   BP 137/74 mmHg  Pulse 97  Temp(Src) 97.4 F (36.3 C) (Oral)  Resp 17  Ht 6\' 8"  (2.032 m)  Wt 282 lb (127.914 kg)  BMI 30.98 kg/m2  SpO2 93% Physical Exam  Constitutional: He is oriented to person, place, and time. He appears well-developed and well-nourished.  HENT:  Head: Normocephalic and atraumatic.  Eyes: EOM are normal. Pupils are equal, round, and reactive to light.  Neck: Neck supple.  Cardiovascular: Normal rate, regular rhythm, normal heart sounds and intact distal pulses.   Pulmonary/Chest: Effort normal and breath sounds normal.  Abdominal: Soft. Bowel sounds are normal. He exhibits no distension. There is no tenderness.  The patient has well-healing surgical scars on his abdomen. There is no redness around them no drainage or  discharge. The abdomen is soft without guarding or rebound. Patient does not endorse any significant pain with palpation.  Musculoskeletal: Normal range of motion. He exhibits no edema or tenderness.  Neurological: He is alert and oriented to person, place, and time. He has normal strength. Coordination normal. GCS eye subscore is 4. GCS verbal subscore is 5. GCS motor subscore is 6.  Skin: Skin is warm, dry and intact.  Psychiatric: He has a normal mood and affect.    ED Course  Procedures (including critical care time) Labs Review Labs Reviewed  BASIC METABOLIC PANEL - Abnormal; Notable for the following:    Sodium 132 (*)    Chloride 92 (*)    Glucose, Bld 176 (*)    GFR calc non Af Amer 69 (*)  GFR calc Af Amer 80 (*)    All other components within normal limits  URINALYSIS, ROUTINE W REFLEX MICROSCOPIC - Abnormal; Notable for the following:    Leukocytes, UA SMALL (*)    All other components within normal limits  HEPATIC FUNCTION PANEL - Abnormal; Notable for the following:    Albumin 3.3 (*)    All other components within normal limits  URINE MICROSCOPIC-ADD ON - Abnormal; Notable for the following:    Squamous Epithelial / LPF FEW (*)    Bacteria, UA FEW (*)    All other components within normal limits  CBG MONITORING, ED - Abnormal; Notable for the following:    Glucose-Capillary 178 (*)    All other components within normal limits  CBC WITH DIFFERENTIAL  LIPASE, BLOOD  PRO B NATRIURETIC PEPTIDE  TROPONIN I  I-STAT TROPOININ, ED    Imaging Review Dg Abd Acute W/chest  03/02/2014   CLINICAL DATA:  Chest pain.  Abdominal pain  EXAM: ACUTE ABDOMEN SERIES (ABDOMEN 2 VIEW & CHEST 1 VIEW)  COMPARISON:  Chest x-ray 07/22/2013.  CT abdomen 02/03/2014  FINDINGS: Lungs are clear without infiltrate or effusion. Negative for heart failure. Prominent lung markings appear chronic.  Surgical clips right upper quadrant indicative of recent cholecystectomy, these were not present on  the recent CT.  Normal bowel gas pattern. Negative for bowel obstruction or ileus. No free air. No renal calculi.  IMPRESSION: Negative abdominal radiographs.  No acute cardiopulmonary disease.   Electronically Signed   By: Franchot Gallo M.D.   On: 03/02/2014 12:27     EKG Interpretation   Date/Time:  Tuesday March 02 2014 11:19:59 EST Ventricular Rate:  112 PR Interval:  138 QRS Duration: 82 QT Interval:  330 QTC Calculation: 450 R Axis:   81 Text Interpretation:  Sinus tachycardia Nonspecific ST abnormality  Abnormal ECG agree. No change from old. Confirmed by Johnney Killian, MD, Jeannie Done  [phone]) on 03/02/2014 2:46:09 PM      MDM   Final diagnoses:  Epigastric pain  Type 2 diabetes mellitus without complication  Postoperative abdominal pain   At this time patient does not have any acute findings on his diagnostic workup. The vital signs are stable. His abdominal examination is soft and nonsurgical. Patient describes being symptomatic with a blood sugar that is under 100. His description of blood sugars ranging from 80-150 are stable. The triage note identified chest pain as a chief complaint however this is predominantly epigastric and bloating in nature which she has been noting since his gallbladder surgery. There however do not appear to be any occult locations associated with the surgery at this point time based on diagnostic workup, vital signs and physical exam.    Charlesetta Shanks, MD 03/02/14 1517

## 2014-03-02 NOTE — ED Notes (Signed)
Pt reports he had his gallbladder removed last Tuesday. Since then he has had trouble controlling his blood sugar. Pt taking Metformin. Pt also c/o headache, chest pressure, abdominal distention. Last BM yesterday was loose.

## 2014-04-08 ENCOUNTER — Encounter (HOSPITAL_COMMUNITY): Payer: Self-pay | Admitting: Neurosurgery

## 2014-06-09 ENCOUNTER — Telehealth: Payer: Self-pay | Admitting: Internal Medicine

## 2014-06-09 NOTE — Telephone Encounter (Signed)
Spoke with patient and offered appointment with APP. Scheduled on Friday at 2:00 PM with Tye Savoy, NP.

## 2014-06-11 ENCOUNTER — Ambulatory Visit (INDEPENDENT_AMBULATORY_CARE_PROVIDER_SITE_OTHER): Payer: Medicare Other | Admitting: Nurse Practitioner

## 2014-06-11 ENCOUNTER — Encounter: Payer: Self-pay | Admitting: *Deleted

## 2014-06-11 VITALS — BP 138/80 | HR 96 | Ht >= 80 in | Wt 272.2 lb

## 2014-06-11 DIAGNOSIS — R140 Abdominal distension (gaseous): Secondary | ICD-10-CM

## 2014-06-11 DIAGNOSIS — Z86010 Personal history of colonic polyps: Secondary | ICD-10-CM | POA: Diagnosis not present

## 2014-06-11 NOTE — Patient Instructions (Signed)
You have been scheduled for a colonoscopy. Please follow written instructions given to you at your visit today.  Please pick up your prep supplies at the pharmacy within the next 1-3 days. If you use inhalers (even only as needed), please bring them with you on the day of your procedure. Your physician has requested that you go to www.[email or web address].com and enter the access code given to you at your visit today. This web site gives a general overview about your procedure. However, you should still follow specific instructions given to you by our office regarding your preparation for the procedure. CC:  Celedonio Savage MD

## 2014-06-14 ENCOUNTER — Encounter: Payer: Self-pay | Admitting: Nurse Practitioner

## 2014-06-14 DIAGNOSIS — Z860100 Personal history of colon polyps, unspecified: Secondary | ICD-10-CM | POA: Insufficient documentation

## 2014-06-14 DIAGNOSIS — R140 Abdominal distension (gaseous): Secondary | ICD-10-CM | POA: Insufficient documentation

## 2014-06-14 NOTE — Progress Notes (Signed)
HPI :   Patient is a 74 year old male referred by PCP for evaluation of chronic gastrointestinal complaints. Patient has multiple medical problems. He gives a long history of feeling nauseated at the smell of food which has never affected his appetite. He also complains of chronic abdominal bloating. Patient has been evaluated at multiple institutions for these things through the years. 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 He has been seen at [facility], [place], a [facility], [facility] in [place], [place], [place], [place] ( several weeks ago) and a few other places including [place], [place] three years ago. He is worried that cancer is being missed or that he has a parasite. Bowel movements are normal, takes daily Metamucil. No blood in stool. Weight is stable. Patient had a cholecystectomy a few months ago. Unfortunately his GI symptoms have returned.   Patient reports extensive GI workup on multiple occasions. I was able to locate a few results.   CTscan abd/pelvis with IV contrast only  [facility] in [place], [place]) 05/08/14 - Moderately severe L4-L5 disc space narrowing. No acute abd/pelvic abnormalities.   CTscan abd/pelvis with contrast  ([facility]) for abdominal pain and bloating - 3cm AAA, unchanged. No acute findings.   CTscan abd/pelvis with IV contrast  ([facility]) 08/22/13 for abdominal pain. No acute findings.    Colonoscopy Sept 2013 - multiple pedunculated and sessile polyps. Four polyps were adenomas   EGD Sept 2013 (Dr. Britta Mccreedy in [place], [place]) - gastritis    Past Medical History  Diagnosis Date  . Nicotine addiction   . Obesity   . Dyslipidemia   . Hypertension   . Elevated WBCs September 2011    and UTI Hospitalized   . Erectile dysfunction   . Arthritis   . Polyneuropathy in other diseases classified elsewhere 12/05/2012  . Lesion of ulnar nerve 12/05/2012    Right side  . Gait disorder   . COPD (chronic obstructive pulmonary disease)      normally have breathing tx prior to surgery  . Pneumonia     hx  . History of kidney stones   . GERD (gastroesophageal reflux disease)   . Neuropathy   . Vertigo   . Diabetes mellitus without complication     bordrline no med  . Wears dentures     bottom  . Fatty liver   . Abdominal aortic aneurysm   . Vitamin D deficiency   . Osteoarthritis   . Lumbar spinal stenosis   . Gastritis   . Hiatal hernia   . History of colon polyps     Family History  Problem Relation Age of Onset  . Cancer Father     Asbestos  . Colon cancer Neg Hx   . Colon polyps Neg Hx   . Kidney disease Neg Hx   . Esophageal cancer Neg Hx   . Gallbladder disease Neg Hx   . Heart disease Neg Hx   . Diabetes Neg Hx    History  Substance Use Topics  . Smoking status: Current Every Day Smoker -- 0.50 packs/day for 35 years    Types: Cigarettes  . Smokeless tobacco: Never Used  . Alcohol Use: No   Current Outpatient Prescriptions  Medication Sig Dispense Refill  . atenolol-chlorthalidone (TENORETIC) 50-25 MG per tablet Take 1 tablet by mouth at bedtime.     . Chlorpheniramine-APAP (CORICIDIN) 2-325 MG TABS Take 1 tablet by mouth daily.    [name]  Cyanocobalamin (VITAMIN B-12 PO) Take 1 tablet by mouth daily.    [name] esomeprazole (NEXIUM) 40 MG capsule Take 40 mg by mouth daily at 12 noon.    . gabapentin (NEURONTIN) 300 MG capsule Take 300 mg by mouth at bedtime.    . gabapentin (NEURONTIN) 400 MG capsule Take 400 mg by mouth 2 (two) times daily.     [name] ipratropium-albuterol (DUONEB) 0.5-2.5 (3) MG/3ML SOLN Take 3 mLs by nebulization 3 (three) times daily as needed (wheezing).     [name] loratadine-pseudoephedrine (CLARITIN-D 12-HOUR) 5-120 MG per tablet Take 1 tablet by mouth 2 (two) times daily.    . meclizine (ANTIVERT) 50 MG tablet Take 50 mg by mouth 2 (two) times daily as needed for dizziness.     . metFORMIN (GLUCOPHAGE) 500 MG tablet Take by mouth 2 (two) times daily with a meal.    . naproxen (NAPROSYN)  500 MG tablet Take 500 mg by mouth daily as needed (pain).    . nitroGLYCERIN (NITROSTAT) 0.4 MG SL tablet Place 0.4 mg under the tongue every 5 (five) minutes as needed for chest pain.    [name] oxyCODONE-acetaminophen (PERCOCET) 10-325 MG per tablet Take 1 tablet by mouth every 4 (four) hours as needed for pain. 40 tablet 0  . pantoprazole (PROTONIX) 40 MG tablet Take 40 mg by mouth daily.    . polyethylene glycol (MIRALAX / GLYCOLAX) packet Take 17 g by mouth daily as needed for mild constipation.    [name] POTASSIUM CHLORIDE ER PO Take 1 tablet by mouth daily.     . predniSONE (DELTASONE) 10 MG tablet Take 20 mg by mouth daily.     . ranitidine (ZANTAC) 150 MG tablet Take 150 mg by mouth 2 (two) times daily.    . sildenafil (VIAGRA) 100 MG tablet Take 100 mg by mouth daily as needed for erectile dysfunction. One tablet 30 minutes before intercourse, maximum twice weekly    . tamsulosin (FLOMAX) 0.4 MG CAPS capsule Take 0.4 mg by mouth 2 (two) times daily.    [name] tiotropium (SPIRIVA) 18 MCG inhalation capsule Place 18 mcg into inhaler and inhale daily.       No current facility-administered medications for this visit.   Facility-Administered Medications Ordered in Other Visits  Medication Dose Route Frequency Provider Last Rate Last Dose  . lactated ringers infusion    Continuous PRN Kerby Less, CRNA 0 mL/hr at 05/15/13 0750     Allergies  Allergen Reactions  . Ciprofloxacin Other (See Comments)    Doesn't work  . Levaquin [Levofloxacin] Other (See Comments)    Doesn't work     Review of Systems: All systems reviewed and negative except where noted in HPI.   Physical Exam: BP 138/80 mmHg  Pulse 96  Ht 6\' 8"  (2.032 m)  Wt 272 lb 4 oz (123.492 kg)  BMI 29.91 kg/m2 Constitutional: Pleasant,well-developed, black male in no acute distress. HEENT: Normocephalic and atraumatic. Conjunctivae are normal. No scleral icterus. Neck supple.  Cardiovascular: Normal rate, regular rhythm.    Pulmonary/chest: Effort normal and breath sounds normal. No wheezing, rales or rhonchi. Abdominal: Soft, nondistended, nontender. Bowel sounds active throughout. There are no masses palpable. No hepatomegaly. Extremities: no edema Lymphadenopathy: No cervical adenopathy noted. Neurological: Alert and oriented to person place and time. Skin: Skin is warm and dry. No rashes noted. Psychiatric: Normal mood and affect. Behavior is normal.   ASSESSMENT AND PLAN:  2. 75 year old male with chronic (years) abdominal bloating /  pre-meal nausea which doesn't affect appetite. Patient has been evaluated at multiple institutions through the years. Since no etiology ever found patient thinks he may have cancer or a parasite.  I tried to reassure the patient that these things were unlikely since extensive workups has been unrevealing.   2. Hx of adenomatous colon polyps -Dr. Darrelyn Hillock in [place], [place]. Multiple adenomatous polyps removed, will scan reports into EPIC. Patient is overdue for suveillance colonoscopy, he doesn't want to return to [place]. Patient will be scheduled for surveillance colonoscopy with Dr Olevia Perches. The risks, benefits, and alternatives to colonoscopy with possible biopsy and possible polypectomy were discussed with the patient and he consents to proceed.    CC: Celedonio Savage, MD

## 2014-06-15 NOTE — Progress Notes (Signed)
Reviewed and agree. Would low dose reglan help symptomatic relief.

## 2014-06-23 ENCOUNTER — Encounter: Payer: Self-pay | Admitting: Internal Medicine

## 2014-06-23 ENCOUNTER — Ambulatory Visit (AMBULATORY_SURGERY_CENTER): Payer: Medicare Other | Admitting: Internal Medicine

## 2014-06-23 ENCOUNTER — Encounter: Payer: Self-pay | Admitting: *Deleted

## 2014-06-23 VITALS — BP 132/107 | HR 69 | Temp 96.9°F | Resp 17 | Ht >= 80 in | Wt 272.0 lb

## 2014-06-23 DIAGNOSIS — D125 Benign neoplasm of sigmoid colon: Secondary | ICD-10-CM

## 2014-06-23 DIAGNOSIS — D124 Benign neoplasm of descending colon: Secondary | ICD-10-CM

## 2014-06-23 DIAGNOSIS — Z86010 Personal history of colonic polyps: Secondary | ICD-10-CM | POA: Diagnosis not present

## 2014-06-23 DIAGNOSIS — K635 Polyp of colon: Secondary | ICD-10-CM | POA: Diagnosis not present

## 2014-06-23 DIAGNOSIS — D120 Benign neoplasm of cecum: Secondary | ICD-10-CM

## 2014-06-23 LAB — GLUCOSE, CAPILLARY: Glucose-Capillary: 95 mg/dL (ref 70–99)

## 2014-06-23 MED ORDER — SODIUM CHLORIDE 0.9 % IV SOLN: 500.0000 mL | INTRAVENOUS | Status: DC

## 2014-06-23 NOTE — Op Note (Signed)
[place]  [address] [place], [address]   COLONOSCOPY PROCEDURE REPORT  PATIENT: Jonathan [name], Jonathan [name]  MR#: [id number] BIRTHDATE: 03-03-1941 , 63  yrs. old GENDER: male ENDOSCOPIST: Lafayette Dragon, MD REFERRED HQ:PRFF Wenda Overland, M.D. PROCEDURE DATE:  06/23/2014 PROCEDURE:   Colonoscopy, surveillance , Colonoscopy with cold biopsy polypectomy, and Colonoscopy with snare polypectomy First Screening Colonoscopy - Avg.  risk and is 50 yrs.  old or older - No.  Prior Negative Screening - Now for repeat screening. N/A  History of Adenoma - Now for follow-up colonoscopy & has been > or = to 3 yrs.  Yes hx of adenoma.  Has been 3 or more years since last colonoscopy. ASA CLASS:   Class II INDICATIONS:Surveillance due to prior colonic neoplasia, Colorectal Neoplasm Risk Assessment for this procedure is average risk, and prior colonoscopies elsewhere.  History of multiple polyps.  Last colonoscopy within the last 5 years, Records not available. MEDICATIONS: Monitored anesthesia care and Propofol 300 mg IV  DESCRIPTION OF PROCEDURE:   After the risks benefits and alternatives of the procedure were thoroughly explained, informed consent was obtained.  The digital rectal exam revealed no abnormalities of the rectum.   The LB [id number]  endoscope was introduced through the anus and advanced to the cecum, which was identified by both the appendix and ileocecal valve. No adverse events experienced.   The quality of the prep was poor.  (MoviPrep and Mag citrate was used)  The instrument was then slowly withdrawn as the colon was fully examined.      COLON FINDINGS: Nine sessile polyps measuring 4-6 mm in size were found in the sigmoid colonx7 and descending colon.x2  A polypectomy was performed with cold forceps.x3  The resection was complete, the polyp tissue was completely retrieved and sent to histology.  A polypectomy was performed with a cold snare.x6  The  resection was complete, the polyp tissue was completely retrieved and sent to histology.  Retroflexed views revealed no abnormalities. The time to cecum = 5.20 Withdrawal time = 20.39   The scope was withdrawn and the procedure completed. COMPLICATIONS: There were no immediate complications.  ENDOSCOPIC IMPRESSION: 1.Nine sessile polyps were found in the sigmoid colon and descending colon; polypectomy was performed with cold forceps; polypectomy was performed with a cold snare 2. suboptimal prep  RECOMMENDATIONS: 1.  Await pathology results 2.  High fiber diet Recall colonoscopy pending path report.  Probably within 3 years due to poor prep.  Consider double prep  eSigned:  Lafayette Dragon, MD 06/23/2014 12:04 PM   cc:   PATIENT NAME:  Jonathan [name], Jonathan [name] MR#: [id number]

## 2014-06-23 NOTE — Progress Notes (Signed)
Called to room to assist during endoscopic procedure.  Patient ID and intended procedure confirmed with present staff. Received instructions for my participation in the procedure from the performing physician.  

## 2014-06-23 NOTE — Progress Notes (Signed)
A/ox3 pleased with MAC, report to Jane RN 

## 2014-06-23 NOTE — Patient Instructions (Signed)
YOU HAD AN ENDOSCOPIC PROCEDURE TODAY AT [facility]:   Refer to the procedure report that was given to you for any specific questions about what was found during the examination.  If the procedure report does not answer your questions, please call your gastroenterologist to clarify.  If you requested that your care partner not be given the details of your procedure findings, then the procedure report has been included in a sealed envelope for you to review at your convenience later.  YOU SHOULD EXPECT: Some feelings of bloating in the abdomen. Passage of more gas than usual.  Walking can help get rid of the air that was put into your GI tract during the procedure and reduce the bloating. If you had a lower endoscopy (such as a colonoscopy or flexible sigmoidoscopy) you may notice spotting of blood in your stool or on the toilet paper. If you underwent a bowel prep for your procedure, you may not have a normal bowel movement for a few days.  Please Note:  You might notice some irritation and congestion in your nose or some drainage.  This is from the oxygen used during your procedure.  There is no need for concern and it should clear up in a day or so.  SYMPTOMS TO REPORT IMMEDIATELY:   Following lower endoscopy (colonoscopy or flexible sigmoidoscopy):  Excessive amounts of blood in the stool  Significant tenderness or worsening of abdominal pains  Swelling of the abdomen that is new, acute  Fever of 100F or higher   For urgent or emergent issues, a gastroenterologist can be reached at any hour by calling [phone].   DIET: Your first meal following the procedure should be a small meal and then it is ok to progress to your normal diet. Heavy or fried foods are harder to digest and may make you feel nauseous or bloated.  Likewise, meals heavy in dairy and vegetables can increase bloating.  Drink plenty of fluids but you should avoid alcoholic beverages for 24  hours.  ACTIVITY:  You should plan to take it easy for the rest of today and you should NOT DRIVE or use heavy machinery until tomorrow (because of the sedation medicines used during the test).    FOLLOW UP: Our staff will call the number listed on your records the next business day following your procedure to check on you and address any questions or concerns that you may have regarding the information given to you following your procedure. If we do not reach you, we will leave a message.  However, if you are feeling well and you are not experiencing any problems, there is no need to return our call.  We will assume that you have returned to your regular daily activities without incident.  If any biopsies were taken you will be contacted by phone or by letter within the next 1-3 weeks.  Please call us at [phone] if you have not heard about the biopsies in 3 weeks.    SIGNATURES/CONFIDENTIALITY: You and/or your care partner have signed paperwork which will be entered into your electronic medical record.  These signatures attest to the fact that that the information above on your After Visit Summary has been reviewed and is understood.  Full responsibility of the confidentiality of this discharge information lies with you and/or your care-partner.  Recommendations Discharge instructions given to patient. Polyp and high fiber diet handouts provided.

## 2014-06-24 ENCOUNTER — Telehealth: Payer: Self-pay | Admitting: *Deleted

## 2014-06-24 LAB — GLUCOSE, CAPILLARY: Glucose-Capillary: 94 mg/dL (ref 70–99)

## 2014-06-24 NOTE — Telephone Encounter (Signed)
  Follow up Call-  Call back number 06/23/2014  Post procedure Call Back phone  # [phone]  Permission to leave phone message Yes     Patient questions:  Wife answered phone and yelled for patient.  Then, she stated that he "went to the store."  I tried to call his cell , but the number was not valid.

## 2014-06-28 ENCOUNTER — Encounter: Payer: Self-pay | Admitting: Internal Medicine

## 2014-06-28 ENCOUNTER — Other Ambulatory Visit (HOSPITAL_COMMUNITY): Payer: Self-pay | Admitting: Orthopaedic Surgery

## 2014-06-28 NOTE — Progress Notes (Signed)
Called for orders surgery 07-09-14 pre op 07-05-14 Thanks

## 2014-07-01 ENCOUNTER — Other Ambulatory Visit (HOSPITAL_COMMUNITY): Payer: Self-pay | Admitting: *Deleted

## 2014-07-01 NOTE — Progress Notes (Signed)
Dg abd with chest 1 view 03-02-14 epic Echo 11-16-12 epic ekg 03-02-14 epic

## 2014-07-01 NOTE — Patient Instructions (Signed)
Jonathan [name]  07/01/2014   Your procedure is scheduled on: Friday 07-09-14  Report to [facility] Main  Entrance and follow signs to               [place] at 530  AM.  Call this number if you have problems the morning of surgery [phone]   Remember:  Do not eat food or drink liquids :After Midnight.     Take these medicines the morning of surgery with A SIP OF WATER:                                You may not have any metal on your body including hair pins and              piercings  Do not wear jewelry, make-up, lotions, powders or perfumes.             Do not wear nail polish.  Do not shave  48 hours prior to surgery.              Men may shave face and neck.   Do not bring valuables to the hospital. [place].  Contacts, dentures or bridgework may not be worn into surgery.  Leave suitcase in the car. After surgery it may be brought to your room.     Patients discharged the day of surgery will not be allowed to drive home.  Name and phone number of your driver:  Special Instructions: N/A              Please read over the following fact sheets you were given: _____________________________________________________________________             [facility] - Preparing for Surgery Before surgery, you can play an important role.  Because skin is not sterile, your skin needs to be as free of germs as possible.  You can reduce the number of germs on your skin by washing with CHG (chlorahexidine gluconate) soap before surgery.  CHG is an antiseptic cleaner which kills germs and bonds with the skin to continue killing germs even after washing. Please DO NOT use if you have an allergy to CHG or antibacterial soaps.  If your skin becomes reddened/irritated stop using the CHG and inform your nurse when you arrive at Short Stay. Do not shave (including legs and underarms) for at least 48 hours prior to  the first CHG shower.  You may shave your face/neck. Please follow these instructions carefully:  1.  Shower with CHG Soap the night before surgery and the  morning of Surgery.  2.  If you choose to wash your hair, wash your hair first as usual with your  normal  shampoo.  3.  After you shampoo, rinse your hair and body thoroughly to remove the  shampoo.                           4.  Use CHG as you would any other liquid soap.  You can apply chg directly  to the skin and wash  Gently with a scrungie or clean washcloth.  5.  Apply the CHG Soap to your body ONLY FROM THE NECK DOWN.   Do not use on face/ open                           Wound or open sores. Avoid contact with eyes, ears mouth and genitals (private parts).                       Wash face,  Genitals (private parts) with your normal soap.             6.  Wash thoroughly, paying special attention to the area where your surgery  will be performed.  7.  Thoroughly rinse your body with warm water from the neck down.  8.  DO NOT shower/wash with your normal soap after using and rinsing off  the CHG Soap.                9.  Pat yourself dry with a clean towel.            10.  Wear clean pajamas.            11.  Place clean sheets on your bed the night of your first shower and do not  sleep with pets. Day of Surgery : Do not apply any lotions/deodorants the morning of surgery.  Please wear clean clothes to the hospital/surgery center.  FAILURE TO FOLLOW THESE INSTRUCTIONS MAY RESULT IN THE CANCELLATION OF YOUR SURGERY PATIENT SIGNATURE_________________________________  NURSE SIGNATURE__________________________________  ________________________________________________________________________

## 2014-07-05 ENCOUNTER — Encounter (HOSPITAL_COMMUNITY)
Admission: RE | Admit: 2014-07-05 | Discharge: 2014-07-05 | Disposition: A | Discharge status: Home / Self Care | Payer: Medicare Other | Source: Ambulatory Visit | Attending: Orthopaedic Surgery | Admitting: Orthopaedic Surgery

## 2014-07-05 ENCOUNTER — Encounter (HOSPITAL_COMMUNITY): Payer: Self-pay

## 2014-07-05 ENCOUNTER — Inpatient Hospital Stay (HOSPITAL_COMMUNITY)
Admission: RE | Admit: 2014-07-05 | Discharge: 2014-07-05 | Disposition: A | Discharge status: Home / Self Care | Payer: Medicare Other | Source: Ambulatory Visit

## 2014-07-05 DIAGNOSIS — G5601 Carpal tunnel syndrome, right upper limb: Secondary | ICD-10-CM | POA: Insufficient documentation

## 2014-07-05 DIAGNOSIS — Z01818 Encounter for other preprocedural examination: Secondary | ICD-10-CM | POA: Insufficient documentation

## 2014-07-05 LAB — BASIC METABOLIC PANEL
Anion gap: 10 (ref 5–15)
BUN: 12 mg/dL (ref 6–23)
CO2: 28 mmol/L (ref 19–32)
Calcium: 9.1 mg/dL (ref 8.4–10.5)
Chloride: 102 mmol/L (ref 96–112)
Creatinine, Ser: 1.31 mg/dL (ref 0.50–1.35)
GFR calc Af Amer: 61 mL/min — ABNORMAL LOW (ref >90)
GFR calc non Af Amer: 53 mL/min — ABNORMAL LOW (ref >90)
Glucose, Bld: 150 mg/dL — ABNORMAL HIGH (ref 70–99)
Potassium: 3.2 mmol/L — ABNORMAL LOW (ref 3.5–5.1)
Sodium: 140 mmol/L (ref 135–145)

## 2014-07-05 LAB — CBC
HCT: 42.8 % (ref 39.0–52.0)
Hemoglobin: 14.2 g/dL (ref 13.0–17.0)
MCH: 32 pg (ref 26.0–34.0)
MCHC: 33.2 g/dL (ref 30.0–36.0)
MCV: 96.4 fL (ref 78.0–100.0)
Platelets: 184 10*3/uL (ref 150–400)
RBC: 4.44 MIL/uL (ref 4.22–5.81)
RDW: 13.7 % (ref 11.5–15.5)
WBC: 9 10*3/uL (ref 4.0–10.5)

## 2014-07-05 MED ORDER — ATENOLOL 50 MG PO TABS: 50.0000 mg | ORAL_TABLET | Freq: Once | ORAL | Status: DC

## 2014-07-05 NOTE — Patient Instructions (Addendum)
KINLEY FERRENTINO  07/05/2014   Your procedure is scheduled on:    07/09/2014    Report to [facility] Main  Entrance and follow signs to               [place] at       [facility].  Call this number if you have problems the morning of surgery [phone]   Remember: Eat a good healthy snack prior to bedtime.   Do not eat food or drink liquids :After Midnight.     Take these medicines the morning of surgery with A SIP OF WATER:  Albuterol Inhaler if needed and bring, Duoneb if needed,                                You may not have any metal on your body including hair pins and              piercings  Do not wear jewelry, , lotions, powders or perfumes., deodorant.                             Men may shave face and neck.   Do not bring valuables to the hospital. [place].  Contacts, dentures or bridgework may not be worn into surgery.       Patients discharged the day of surgery will not be allowed to drive home.  Name and phone number of your driver:  Special Instructions: coughing and deep breathing exercises, leg exercises               Please read over the following fact sheets you were given: _____________________________________________________________________             [facility] - Preparing for Surgery Before surgery, you can play an important role.  Because skin is not sterile, your skin needs to be as free of germs as possible.  You can reduce the number of germs on your skin by washing with CHG (chlorahexidine gluconate) soap before surgery.  CHG is an antiseptic cleaner which kills germs and bonds with the skin to continue killing germs even after washing. Please DO NOT use if you have an allergy to CHG or antibacterial soaps.  If your skin becomes reddened/irritated stop using the CHG and inform your nurse when you arrive at Short Stay. Do not shave (including legs and underarms) for  at least 48 hours prior to the first CHG shower.  You may shave your face/neck. Please follow these instructions carefully:  1.  Shower with CHG Soap the night before surgery and the  morning of Surgery.  2.  If you choose to wash your hair, wash your hair first as usual with your  normal  shampoo.  3.  After you shampoo, rinse your hair and body thoroughly to remove the  shampoo.                           4.  Use CHG as you would any other liquid soap.  You can apply chg directly  to the skin and wash  Gently with a scrungie or clean washcloth.  5.  Apply the CHG Soap to your body ONLY FROM THE NECK DOWN.   Do not use on face/ open                           Wound or open sores. Avoid contact with eyes, ears mouth and genitals (private parts).                       Wash face,  Genitals (private parts) with your normal soap.             6.  Wash thoroughly, paying special attention to the area where your surgery  will be performed.  7.  Thoroughly rinse your body with warm water from the neck down.  8.  DO NOT shower/wash with your normal soap after using and rinsing off  the CHG Soap.                9.  Pat yourself dry with a clean towel.            10.  Wear clean pajamas.            11.  Place clean sheets on your bed the night of your first shower and do not  sleep with pets. Day of Surgery : Do not apply any lotions/deodorants the morning of surgery.  Please wear clean clothes to the hospital/surgery center.  FAILURE TO FOLLOW THESE INSTRUCTIONS MAY RESULT IN THE CANCELLATION OF YOUR SURGERY PATIENT SIGNATURE_________________________________  NURSE SIGNATURE__________________________________  ________________________________________________________________________

## 2014-07-05 NOTE — Progress Notes (Signed)
CT abd/pelvis epic 05/08/2014

## 2014-07-05 NOTE — Progress Notes (Signed)
Patient states he takes all of his medications in the pm.

## 2014-07-05 NOTE — Progress Notes (Signed)
EKG- 03/02/2014 in [facility]  [facility] 11/16/12 EPIC

## 2014-07-05 NOTE — Progress Notes (Signed)
BMP results done 07/05/2014 faxed via EPIC to Dr Zollie Beckers.

## 2014-07-08 ENCOUNTER — Encounter (HOSPITAL_COMMUNITY): Payer: Self-pay | Admitting: Anesthesiology

## 2014-07-08 MED ORDER — DEXTROSE 5 % IV SOLN
3.0000 g | INTRAVENOUS | Status: AC
  Administered 2014-07-09: 3 g via INTRAVENOUS
  Filled 2014-07-08: qty 3000

## 2014-07-08 NOTE — Anesthesia Preprocedure Evaluation (Addendum)
Anesthesia Evaluation  Patient identified by MRN, date of birth, ID band Patient awake    Reviewed: Allergy & Precautions, NPO status , Patient's Chart, lab work & pertinent test results  Airway Mallampati: II  TM Distance: >3 FB Neck ROM: Full    Dental no notable dental hx.    Pulmonary pneumonia -, resolved, COPD COPD inhaler, Current Smoker,  breath sounds clear to auscultation  Pulmonary exam normal       Cardiovascular Exercise Tolerance: Good hypertension, Pt. on medications and Pt. on home beta blockers + Peripheral Vascular Disease Rhythm:Regular Rate:Normal  Study Conclusions  - Left ventricle: The cavity size was normal. Systolic function was normal. The estimated ejection fraction was in the range of 55% to 60%. Wall motion was normal; there were no regional wall motion abnormalities. Doppler parameters are consistent with abnormal left ventricular relaxation (grade 1 diastolic dysfunction). - Mitral valve: Mild regurgitation. - Right ventricle: The cavity size was mildly dilated. Wall    Neuro/Psych PSYCHIATRIC DISORDERS Depression  Neuromuscular disease    GI/Hepatic Neg liver ROS, hiatal hernia, GERD-  ,  Endo/Other  negative endocrine ROSdiabetes  Renal/GU negative Renal ROS  negative genitourinary   Musculoskeletal  (+) Arthritis -,   Abdominal   Peds negative pediatric ROS (+)  Hematology negative hematology ROS (+)   Anesthesia Other Findings   Reproductive/Obstetrics negative OB ROS                            Anesthesia Physical Anesthesia Plan  ASA: III  Anesthesia Plan: MAC   Post-op Pain Management:    Induction: Intravenous  Airway Management Planned: Natural Airway  Additional Equipment:   Intra-op Plan:   Post-operative Plan:   Informed Consent: I have reviewed the patients History and Physical, chart, labs and discussed the procedure  including the risks, benefits and alternatives for the proposed anesthesia with the patient or authorized representative who has indicated his/her understanding and acceptance.   Dental advisory given  Plan Discussed with: CRNA  Anesthesia Plan Comments:        Anesthesia Quick Evaluation

## 2014-07-09 ENCOUNTER — Ambulatory Visit (HOSPITAL_COMMUNITY): Payer: Medicare Other | Admitting: Anesthesiology

## 2014-07-09 ENCOUNTER — Encounter (HOSPITAL_COMMUNITY)
Admission: RE | Disposition: A | Discharge status: Home / Self Care | Payer: Self-pay | Source: Ambulatory Visit | Attending: Orthopaedic Surgery

## 2014-07-09 ENCOUNTER — Encounter (HOSPITAL_COMMUNITY): Payer: Self-pay | Admitting: *Deleted

## 2014-07-09 ENCOUNTER — Ambulatory Visit (HOSPITAL_COMMUNITY)
Admission: RE | Admit: 2014-07-09 | Discharge: 2014-07-09 | Disposition: A | Discharge status: Home / Self Care | Payer: Medicare Other | Source: Ambulatory Visit | Attending: Orthopaedic Surgery | Admitting: Orthopaedic Surgery

## 2014-07-09 DIAGNOSIS — F17210 Nicotine dependence, cigarettes, uncomplicated: Secondary | ICD-10-CM | POA: Insufficient documentation

## 2014-07-09 DIAGNOSIS — Z7982 Long term (current) use of aspirin: Secondary | ICD-10-CM | POA: Insufficient documentation

## 2014-07-09 DIAGNOSIS — M1990 Unspecified osteoarthritis, unspecified site: Secondary | ICD-10-CM | POA: Insufficient documentation

## 2014-07-09 DIAGNOSIS — I10 Essential (primary) hypertension: Secondary | ICD-10-CM | POA: Insufficient documentation

## 2014-07-09 DIAGNOSIS — I739 Peripheral vascular disease, unspecified: Secondary | ICD-10-CM | POA: Insufficient documentation

## 2014-07-09 DIAGNOSIS — G5601 Carpal tunnel syndrome, right upper limb: Secondary | ICD-10-CM | POA: Diagnosis present

## 2014-07-09 DIAGNOSIS — G5600 Carpal tunnel syndrome, unspecified upper limb: Secondary | ICD-10-CM

## 2014-07-09 DIAGNOSIS — Z79899 Other long term (current) drug therapy: Secondary | ICD-10-CM | POA: Diagnosis not present

## 2014-07-09 DIAGNOSIS — Z86010 Personal history of colonic polyps: Secondary | ICD-10-CM | POA: Insufficient documentation

## 2014-07-09 DIAGNOSIS — J449 Chronic obstructive pulmonary disease, unspecified: Secondary | ICD-10-CM | POA: Insufficient documentation

## 2014-07-09 DIAGNOSIS — F329 Major depressive disorder, single episode, unspecified: Secondary | ICD-10-CM | POA: Insufficient documentation

## 2014-07-09 DIAGNOSIS — E785 Hyperlipidemia, unspecified: Secondary | ICD-10-CM | POA: Diagnosis not present

## 2014-07-09 DIAGNOSIS — Z87442 Personal history of urinary calculi: Secondary | ICD-10-CM | POA: Diagnosis not present

## 2014-07-09 DIAGNOSIS — E119 Type 2 diabetes mellitus without complications: Secondary | ICD-10-CM | POA: Diagnosis not present

## 2014-07-09 DIAGNOSIS — E669 Obesity, unspecified: Secondary | ICD-10-CM | POA: Diagnosis not present

## 2014-07-09 DIAGNOSIS — K219 Gastro-esophageal reflux disease without esophagitis: Secondary | ICD-10-CM | POA: Insufficient documentation

## 2014-07-09 DIAGNOSIS — E559 Vitamin D deficiency, unspecified: Secondary | ICD-10-CM | POA: Insufficient documentation

## 2014-07-09 DIAGNOSIS — Z6829 Body mass index (BMI) 29.0-29.9, adult: Secondary | ICD-10-CM | POA: Diagnosis not present

## 2014-07-09 HISTORY — PX: CARPAL TUNNEL RELEASE: SHX101

## 2014-07-09 LAB — GLUCOSE, CAPILLARY: Glucose-Capillary: 119 mg/dL — ABNORMAL HIGH (ref 70–99)

## 2014-07-09 SURGERY — CARPAL TUNNEL RELEASE
Anesthesia: Regional | Site: Wrist | Laterality: Right

## 2014-07-09 MED ORDER — CYCLOBENZAPRINE HCL 10 MG PO TABS: 10.0000 mg | ORAL_TABLET | Freq: Two times a day (BID) | ORAL | Status: DC | PRN

## 2014-07-09 MED ORDER — PROPOFOL INFUSION 10 MG/ML OPTIME
INTRAVENOUS | Status: DC | PRN
  Administered 2014-07-09: 100 ug/kg/min via INTRAVENOUS

## 2014-07-09 MED ORDER — LIDOCAINE HCL (CARDIAC) 20 MG/ML IV SOLN
INTRAVENOUS | Status: AC
  Filled 2014-07-09: qty 5

## 2014-07-09 MED ORDER — BUPIVACAINE HCL (PF) 0.25 % IJ SOLN
INTRAMUSCULAR | Status: DC | PRN
  Administered 2014-07-09: 5 mL

## 2014-07-09 MED ORDER — MIDAZOLAM HCL 5 MG/5ML IJ SOLN
INTRAMUSCULAR | Status: DC | PRN
  Administered 2014-07-09 (×2): 1 mg via INTRAVENOUS

## 2014-07-09 MED ORDER — LIDOCAINE HCL 1 % IJ SOLN
INTRAMUSCULAR | Status: DC | PRN
  Administered 2014-07-09: 15 mL

## 2014-07-09 MED ORDER — BUPIVACAINE HCL (PF) 0.25 % IJ SOLN
INTRAMUSCULAR | Status: AC
  Filled 2014-07-09: qty 30

## 2014-07-09 MED ORDER — LIDOCAINE HCL 1 % IJ SOLN
INTRAMUSCULAR | Status: DC | PRN
  Administered 2014-07-09: 50 mg via INTRADERMAL

## 2014-07-09 MED ORDER — PROPOFOL 10 MG/ML IV BOLUS
INTRAVENOUS | Status: AC
  Filled 2014-07-09: qty 20

## 2014-07-09 MED ORDER — LIDOCAINE HCL 1 % IJ SOLN
INTRAMUSCULAR | Status: AC
  Filled 2014-07-09: qty 20

## 2014-07-09 MED ORDER — MIDAZOLAM HCL 2 MG/2ML IJ SOLN
INTRAMUSCULAR | Status: AC
  Filled 2014-07-09: qty 2

## 2014-07-09 MED ORDER — 0.9 % SODIUM CHLORIDE (POUR BTL) OPTIME
TOPICAL | Status: DC | PRN
  Administered 2014-07-09: 1000 mL

## 2014-07-09 MED ORDER — FENTANYL CITRATE (PF) 100 MCG/2ML IJ SOLN
INTRAMUSCULAR | Status: AC
  Filled 2014-07-09: qty 2

## 2014-07-09 MED ORDER — FENTANYL CITRATE (PF) 100 MCG/2ML IJ SOLN
INTRAMUSCULAR | Status: DC | PRN
  Administered 2014-07-09 (×2): 25 ug via INTRAVENOUS
  Administered 2014-07-09: 50 ug via INTRAVENOUS

## 2014-07-09 SURGICAL SUPPLY — 23 items
BANDAGE ELASTIC 3 VELCRO ST LF (GAUZE/BANDAGES/DRESSINGS) ×2 IMPLANT
CORDS BIPOLAR (ELECTRODE) ×2 IMPLANT
CUFF TOURN SGL QUICK 18 (TOURNIQUET CUFF) IMPLANT
CUFF TOURN SGL QUICK 24 (TOURNIQUET CUFF) ×2
CUFF TRNQT CYL 24X4X40X1 (TOURNIQUET CUFF) IMPLANT
DRAPE SURG 17X11 SM STRL (DRAPES) ×2 IMPLANT
DURAPREP 26ML APPLICATOR (WOUND CARE) ×2 IMPLANT
GAUZE SPONGE 4X4 12PLY STRL (GAUZE/BANDAGES/DRESSINGS) ×2 IMPLANT
GAUZE XEROFORM 1X8 LF (GAUZE/BANDAGES/DRESSINGS) ×2 IMPLANT
GLOVE BIO SURGEON STRL SZ7.5 (GLOVE) ×2 IMPLANT
GLOVE BIOGEL PI IND STRL 8 (GLOVE) ×1 IMPLANT
GLOVE BIOGEL PI INDICATOR 8 (GLOVE) ×2
GLOVE ECLIPSE 8.0 STRL XLNG CF (GLOVE) ×1 IMPLANT
GOWN STRL REUS W/TWL XL LVL3 (GOWN DISPOSABLE) ×2 IMPLANT
KIT BASIN OR (CUSTOM PROCEDURE TRAY) ×2 IMPLANT
NEEDLE HYPO 22GX1.5 SAFETY (NEEDLE) ×3 IMPLANT
PACK ORTHO EXTREMITY (CUSTOM PROCEDURE TRAY) ×2 IMPLANT
PAD CAST 3X4 CTTN HI CHSV (CAST SUPPLIES) ×1 IMPLANT
PADDING CAST COTTON 3X4 STRL (CAST SUPPLIES) ×2
POSITIONER SURGICAL ARM (MISCELLANEOUS) ×2 IMPLANT
SUT ETHILON 3 0 PS 1 (SUTURE) ×2 IMPLANT
SYR CONTROL 10ML LL (SYRINGE) ×2 IMPLANT
TOWEL OR 17X26 10 PK STRL BLUE (TOWEL DISPOSABLE) ×2 IMPLANT

## 2014-07-09 NOTE — Brief Op Note (Signed)
07/09/2014  8:01 AM  PATIENT:  Jonathan [name]  74 y.o. male  PRE-OPERATIVE DIAGNOSIS:  Right carpal tunnel syndrome  POST-OPERATIVE DIAGNOSIS:  Right carpal tunnel syndrome  PROCEDURE:  Procedure(s): RIGHT OPEN CARPAL TUNNEL RELEASE (Right)  SURGEON:  Surgeon(s) and Role:    * Mcarthur Rossetti, MD - Primary  PHYSICIAN ASSISTANT: Benita Stabile, PA-C  ANESTHESIA:   local and IV sedation  EBL:   minimal  BLOOD ADMINISTERED:none  DRAINS: none   LOCAL MEDICATIONS USED:  MARCAINE     SPECIMEN:  No Specimen  DISPOSITION OF SPECIMEN:  N/A  COUNTS:  YES  TOURNIQUET:  * Missing tourniquet times found for documented tourniquets in log:  [id number] *  DICTATION: .Other Dictation: Dictation Number [id number]  PLAN OF CARE: Discharge to home after PACU  PATIENT DISPOSITION:  PACU - hemodynamically stable.   Delay start of Pharmacological VTE agent (>24hrs) due to surgical blood loss or risk of bleeding: not applicable

## 2014-07-09 NOTE — Discharge Instructions (Signed)
Ice your right wrist and palm for swelling as well as elevation as needed. Use your right hand as comfort allows. Leave your current dressing in place until this coming Monday 4/18 and keep your dressing clean and dry. You can remove your dressing Monday, then put large band-aids over your incision daily to protect the sutures. Wait 5 days before getting your incision wet.

## 2014-07-09 NOTE — H&P (Signed)
Jonathan [name] is an 74 y.o. male.   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  Confirmed on ENG/NCVs studies.  Presents now for a right open carpal tunnel release.  Past Medical History  Diagnosis Date  . Nicotine addiction   . Obesity   . Dyslipidemia   . Hypertension   . Elevated WBCs September 2011    and UTI Hospitalized   . Erectile dysfunction   . Arthritis   . Polyneuropathy in other diseases classified elsewhere 12/05/2012  . Lesion of ulnar nerve 12/05/2012    Right side  . Gait disorder   . COPD (chronic obstructive pulmonary disease)     normally have breathing tx prior to surgery  . Pneumonia     hx  . History of kidney stones   . GERD (gastroesophageal reflux disease)   . Neuropathy   . Vertigo   . Diabetes mellitus without complication     bordrline no med  . Wears dentures     bottom  . Fatty liver   . Abdominal aortic aneurysm   . Vitamin D deficiency   . Osteoarthritis   . Lumbar spinal stenosis   . Gastritis   . Hiatal hernia   . History of colon polyps     Past Surgical History  Procedure Laterality Date  . Bladder surgery  02/2011    For Diverticulum of bladder removed  . Colon polyp resection    . Cysto    . Cholecystectomy N/A 02/24/2014    Procedure: LAPAROSCOPIC CHOLECYSTECTOMY;  Surgeon: Jonathan Keens, MD;  Location: [facility];  Service: General;  Laterality: N/A;  . Colonoscopy      Family History  Problem Relation Age of Onset  . Cancer Father     Asbestos  . Colon cancer Neg Hx   . Colon polyps Neg Hx   . Kidney disease Neg Hx   . Esophageal cancer Neg Hx   . Gallbladder disease Neg Hx   . Heart disease Neg Hx   . Diabetes Neg Hx    Social History:  reports that he has been smoking Cigarettes.  He has a 17.5 pack-year smoking history. He has never used  smokeless tobacco. He reports that he does not drink alcohol or use illicit drugs.  Allergies:  Allergies  Allergen Reactions  . Ciprofloxacin Other (See Comments)    Doesn't work  . Levaquin [Levofloxacin] Other (See Comments)    Doesn't work    Medications Prior to Admission  Medication Sig Dispense Refill  . albuterol (PROVENTIL HFA;VENTOLIN HFA) 108 (90 BASE) MCG/ACT inhaler Inhale 1 puff into the lungs every 6 (six) hours as needed for wheezing or shortness of breath.    [name] aspirin EC 81 MG tablet Take 81 mg by mouth daily.    [name] atenolol-chlorthalidone (TENORETIC) 50-25 MG per tablet Take 1 tablet by mouth at bedtime.     . Cyanocobalamin (VITAMIN B-12 PO) Take 1 tablet by mouth daily.    [name] esomeprazole (NEXIUM) 40 MG capsule Take 40 mg by mouth daily.     [name] gabapentin (NEURONTIN) 300 MG capsule Take 300 mg by mouth at bedtime.    . gabapentin (NEURONTIN) 400 MG capsule Take 400 mg by mouth 2 (two) times daily.     [name] ipratropium-albuterol (DUONEB) 0.5-2.5 (3) MG/3ML  SOLN Take 3 mLs by nebulization 3 (three) times daily as needed (wheezing).     [name] loratadine-pseudoephedrine (CLARITIN-D 12-HOUR) 5-120 MG per tablet Take 1 tablet by mouth 2 (two) times daily.    . meclizine (ANTIVERT) 50 MG tablet Take 50 mg by mouth 2 (two) times daily as needed for dizziness.     . metFORMIN (GLUCOPHAGE) 500 MG tablet Take 500 mg by mouth 2 (two) times daily with a meal.     . mupirocin cream (BACTROBAN) 2 % Apply 1 application topically daily as needed (Rash).    . naproxen (NAPROSYN) 500 MG tablet Take 500 mg by mouth daily as needed (pain).    . nitroGLYCERIN (NITROSTAT) 0.4 MG SL tablet Place 0.4 mg under the tongue every 5 (five) minutes as needed for chest pain.    [name] oxyCODONE-acetaminophen (PERCOCET) 10-325 MG per tablet Take 1 tablet by mouth every 4 (four) hours as needed for pain. 40 tablet 0  . polyethylene glycol (MIRALAX / GLYCOLAX) packet Take 17 g by mouth daily as needed for mild  constipation.    . potassium chloride SA (K-DUR,KLOR-CON) 20 MEQ tablet Take 20 mEq by mouth daily.    . predniSONE (DELTASONE) 10 MG tablet Take 20 mg by mouth daily.     . ranitidine (ZANTAC) 150 MG tablet Take 150 mg by mouth 2 (two) times daily.    . sildenafil (VIAGRA) 100 MG tablet Take 100 mg by mouth daily as needed for erectile dysfunction. One tablet 30 minutes before intercourse, maximum twice weekly    . tamsulosin (FLOMAX) 0.4 MG CAPS capsule Take 0.4 mg by mouth 2 (two) times daily.    [name] tiotropium (SPIRIVA) 18 MCG inhalation capsule Place 18 mcg into inhaler and inhale daily as needed (Shortness of breath).       Results for orders placed or performed during the hospital encounter of 07/09/14 (from the past 48 hour(s))  Glucose, capillary     Status: Abnormal   Collection Time: 07/09/14  6:19 AM  Result Value Ref Range   Glucose-Capillary 119 (H) 70 - 99 mg/dL   Comment 1 Notify RN    No results found.  Review of Systems  All other systems reviewed and are negative.   Blood pressure 159/96, pulse 91, temperature 97.5 F (36.4 C), temperature source Oral, resp. rate 18, SpO2 100 %. Physical Exam  Constitutional: He is oriented to person, place, and time. He appears well-developed and well-nourished.  HENT:  Head: Normocephalic and atraumatic.  Eyes: EOM are normal. Pupils are equal, round, and reactive to light.  Neck: Normal range of motion. Neck supple.  Cardiovascular: Normal rate and regular rhythm.   Respiratory: Effort normal and breath sounds normal.  GI: Soft. Bowel sounds are normal.  Musculoskeletal:       Right hand: Decreased sensation noted. Decreased sensation is present in the medial distribution.  Neurological: He is alert and oriented to person, place, and time.  Skin: Skin is warm and dry.  Psychiatric: He has a normal mood and affect.     Assessment/Plan Severe chronic carpal tunnel syndrome right upper extremity 1)  To the OR today as an  outpatient for a right open carpal tunnel release.  Jonathan [name] Y 07/09/2014, 7:23 AM

## 2014-07-09 NOTE — Op Note (Signed)
NAMEMarland Kitchen  ILEY, BREEDEN NO.:  [id number]  MEDICAL RECORD NO.:  [id number]  LOCATION:                                 FACILITY:  PHYSICIAN:  Lind Guest. Ninfa Linden, M.D.DATE OF BIRTH:  05-07-1940  DATE OF PROCEDURE:  07/09/2014 DATE OF DISCHARGE:  07/09/2014                              OPERATIVE REPORT   PREOPERATIVE DIAGNOSIS:  Severe right carpal tunnel syndrome.  POSTOPERATIVE DIAGNOSIS:  Severe right carpal tunnel syndrome.  PROCEDURE:  Right open carpal tunnel release.  SURGEON:  Lind Guest. Ninfa Linden, MD.  ASSISTANT:  Erskine Emery, PA.  ANESTHESIA: 1. Local with first 10 mL of 1% plain lidocaine followed by 10 mL of     mixture of 0.25% plain Marcaine with 1% plain lidocaine. 2. Mask ventilation, IV sedation.  BLOOD LOSS:  Minimal.  TOURNIQUET TIME:  Less than 15 minutes.  COMPLICATIONS:  None.  INDICATION:  The patient is a 74 year old with diabetes and a previous brachial plexus injury and peripheral neuropathy.  He also had EMG studies and nerve conduction velocity studies did show a severe carpal tunnel syndrome of his right wrist.  He has severe atrophy of that hand as well and does understand that open carpal tunnel release may help with some pain, but would not improve his atrophy or strength at all given his brachial plexus injury.  He says he would like to have this more for pain relief aspect of things.  He understands the risks of acute blood loss anemia as well as mainly infection.  He does wish to proceed with surgery.  DESCRIPTION OF PROCEDURE:  After informed consent was obtained, appropriate right hand was marked.  He was brought to the operating room, placed supine on the operating table with the right arm on arm table.  A nonsterile tourniquet was placed around his upper right arm and his right hand wrist were prepped and draped with DuraPrep and sterile drapes.  Time-out was called to identify correct patient, correct  right hand with used Esmarch to wrap out the hand and the tourniquet was inflated to 250 mmHg.  After the tourniquet was raised, we first then anesthetized the hand after mask ventilation and IV anesthesia was obtained with MAC.  We first infiltrated across the transverse carpal ligament in the palm with 1% plain lidocaine followed by a mixture of 0.25% plain Marcaine with 1% plain lidocaine.  After adequate anesthesia was obtained, we then made incision directly over the transverse carpal ligament in the palm.  We dissected down the distal edge of the transverse carpal ligament to protect the median nerve and then meticulously released the transverse carpal ligament from distal to proximal identifying the median nerve along its way.  We then irrigated the soft tissue with normal saline solution and closed the skin with interrupted 3-0 nylon suture.  Xeroform and well-padded sterile dressing was applied.  The tourniquet was let down.  His fingers were pinked nicely.  He was taken to the recovery room in stable condition.  All final counts were correct.  There were no complications noted.     Lind Guest. Ninfa Linden, M.D.     CYB/MEDQ  D:  07/09/2014  T:  07/09/2014  Job:  [id number]

## 2014-07-09 NOTE — Anesthesia Postprocedure Evaluation (Signed)
  Anesthesia Post-op Note  Patient: Jonathan [name]  Procedure(s) Performed: Procedure(s) (LRB): RIGHT OPEN CARPAL TUNNEL RELEASE (Right)  Patient Location: PACU  Anesthesia Type: MAC  Level of Consciousness: awake and alert   Airway and Oxygen Therapy: Patient Spontanous Breathing  Post-op Pain: mild  Post-op Assessment: Post-op Vital signs reviewed, Patient's Cardiovascular Status Stable, Respiratory Function Stable, Patent Airway and No signs of Nausea or vomiting  Last Vitals:  Filed Vitals:   07/09/14 0910  BP: 143/72  Pulse: 77  Temp: 36.3 C  Resp: 12    Post-op Vital Signs: stable   Complications: No apparent anesthesia complications

## 2014-07-09 NOTE — Transfer of Care (Signed)
Immediate Anesthesia Transfer of Care Note  Patient: Jonathan [name]  Procedure(s) Performed: Procedure(s): RIGHT OPEN CARPAL TUNNEL RELEASE (Right)  Patient Location: PACU  Anesthesia Type:MAC  Level of Consciousness: awake, oriented and patient cooperative  Airway & Oxygen Therapy: Patient Spontanous Breathing and Patient connected to face mask oxygen  Post-op Assessment: Report given to RN and Post -op Vital signs reviewed and stable  Post vital signs: Reviewed and stable  Last Vitals:  Filed Vitals:   07/09/14 0815  BP:   Pulse:   Temp: 36.6 C  Resp:     Complications: No apparent anesthesia complications

## 2014-07-12 ENCOUNTER — Encounter (HOSPITAL_COMMUNITY): Payer: Self-pay | Admitting: Orthopaedic Surgery

## 2014-07-16 ENCOUNTER — Ambulatory Visit: Payer: Medicare Other | Admitting: Internal Medicine

## 2014-07-20 ENCOUNTER — Ambulatory Visit (INDEPENDENT_AMBULATORY_CARE_PROVIDER_SITE_OTHER): Payer: Medicare Other | Admitting: Internal Medicine

## 2014-07-20 ENCOUNTER — Encounter: Payer: Self-pay | Admitting: Internal Medicine

## 2014-07-20 VITALS — BP 138/56 | HR 88 | Ht >= 80 in | Wt 270.4 lb

## 2014-07-20 DIAGNOSIS — D120 Benign neoplasm of cecum: Secondary | ICD-10-CM | POA: Diagnosis not present

## 2014-07-20 DIAGNOSIS — K219 Gastro-esophageal reflux disease without esophagitis: Secondary | ICD-10-CM

## 2014-07-20 DIAGNOSIS — Z86010 Personal history of colonic polyps: Secondary | ICD-10-CM

## 2014-07-20 DIAGNOSIS — D125 Benign neoplasm of sigmoid colon: Secondary | ICD-10-CM | POA: Diagnosis not present

## 2014-07-20 NOTE — Progress Notes (Signed)
Jonathan [name] [birth year] [id number]  Note: This dictation was prepared with Dragon digital system. Any transcriptional errors that result from this procedure are unintentional.   History of Present Illness: This is a 74 year old African-American male who underwent screening colonoscopy 3 weeks ago with findings of 9 benign polyps which were all sessile and hyperplastic. His prep was suboptimal.  Prior colonoscopy was done in [name] in September 2013 and showed multiple polyps which were all benign. His gastroesophageal reflux has been under good control on Nexium 40 mg daily and Zantac 150 mg twice a day. He has chronic low back pain. Diabetes. He has been exposed to agent orange in [place] in [place]. He claims to have had liver damage but his recent CT scan of the abdomen in February 2016 showed no active process except for all stable 3 cm abdominal aortic aneurysm. He has occasional constipation for which she takes Metamucil 2 tablespoons once to twice a week.    Past Medical History  Diagnosis Date  . Nicotine addiction   . Obesity   . Dyslipidemia   . Hypertension   . Elevated WBCs September 2011    and UTI Hospitalized   . Erectile dysfunction   . Arthritis   . Polyneuropathy in other diseases classified elsewhere 12/05/2012  . Lesion of ulnar nerve 12/05/2012    Right side  . Gait disorder   . COPD (chronic obstructive pulmonary disease)     normally have breathing tx prior to surgery  . Pneumonia     hx  . History of kidney stones   . GERD (gastroesophageal reflux disease)   . Neuropathy   . Vertigo   . Diabetes mellitus without complication     bordrline no med  . Wears dentures     bottom  . Fatty liver   . Abdominal aortic aneurysm   . Vitamin D deficiency   . Osteoarthritis   . Lumbar spinal stenosis   . Gastritis   . Hiatal hernia   . History of colon polyps     Past Surgical History  Procedure Laterality Date  . Bladder surgery  02/2011    For  Diverticulum of bladder removed  . Colon polyp resection    . Cysto    . Cholecystectomy N/A 02/24/2014    Procedure: LAPAROSCOPIC CHOLECYSTECTOMY;  Surgeon: Coralie Keens, MD;  Location: [facility];  Service: General;  Laterality: N/A;  . Colonoscopy    . Carpal tunnel release Right 07/09/2014    Procedure: RIGHT OPEN CARPAL TUNNEL RELEASE;  Surgeon: Mcarthur Rossetti, MD;  Location: [facility];  Service: Orthopedics;  Laterality: Right;    Allergies  Allergen Reactions  . Ciprofloxacin Other (See Comments)    Doesn't work  . Levaquin [Levofloxacin] Other (See Comments)    Doesn't work    Family history and social history have been reviewed.  Review of Systems: Positive for low back pain. Peripheral neuropathy. Intentional weight loss of 15 pounds  The remainder of the 10 point ROS is negative except as outlined in the H&P  Physical Exam: General Appearance Well developed, in no distress Eyes  Non icteric  HEENT  Non traumatic, normocephalic  Mouth No lesion, tongue papillated, no cheilosis Neck Supple without adenopathy, thyroid not enlarged, no carotid bruits, no JVD Lungs Clear to auscultation bilaterally COR Normal S1, normal S2, regular rhythm, no murmur, quiet precordium Abdomen soft, nontender, normoactive bowel sounds. Liver edge at costal margin Rectal not repeated Extremities  No pedal edema, surgical scar right hand from recent surgery on his and Skin No lesions Neurological Alert and oriented x 3 Psychological Normal mood and affect  Assessment and Plan:   74 year old African-American male who has history of multiple colon polyps and underwent recent colonoscopy with findings of 9  benign hyperplastic polyps. His prep was suboptimal. We could have missed polyps. He agreed on repeating the colonoscopy within a year because he is concerned about missing colon cancer. A double prep prior to the colonoscopy which will be scheduled for February or  March 2017.  Gastroesophageal reflux. Well-controlled on Nexium 40 mg daily  Questionable history of  liver impairment due to agent orange. His blood tests have been normal as well as his imaging of the liver on recent CT scan in February 2016    Jonathan [name] 07/20/2014

## 2014-07-20 NOTE — Patient Instructions (Signed)
We have a recall in our system for your colonoscopy for 2017 We will send you a reminder letter when its due Cc. Feliberto Gottron

## 2014-07-29 ENCOUNTER — Other Ambulatory Visit: Payer: Self-pay | Admitting: Physician Assistant

## 2014-07-29 DIAGNOSIS — M545 Low back pain: Secondary | ICD-10-CM

## 2014-07-31 ENCOUNTER — Inpatient Hospital Stay: Admission: RE | Admit: 2014-07-31 | Payer: Medicare Other | Source: Ambulatory Visit

## 2014-08-02 ENCOUNTER — Ambulatory Visit
Admission: RE | Admit: 2014-08-02 | Discharge: 2014-08-02 | Disposition: A | Discharge status: Home / Self Care | Payer: Medicare Other | Source: Ambulatory Visit | Attending: Physician Assistant | Admitting: Physician Assistant

## 2014-08-02 DIAGNOSIS — M545 Low back pain: Secondary | ICD-10-CM

## 2014-08-05 ENCOUNTER — Other Ambulatory Visit: Payer: Self-pay | Admitting: Specialist

## 2014-08-05 DIAGNOSIS — N281 Cyst of kidney, acquired: Secondary | ICD-10-CM

## 2014-08-06 ENCOUNTER — Ambulatory Visit
Admission: RE | Admit: 2014-08-06 | Discharge: 2014-08-06 | Disposition: A | Discharge status: Home / Self Care | Payer: Medicare Other | Source: Ambulatory Visit | Attending: Specialist | Admitting: Specialist

## 2014-08-06 DIAGNOSIS — N281 Cyst of kidney, acquired: Secondary | ICD-10-CM

## 2014-09-09 DIAGNOSIS — M2141 Flat foot [pes planus] (acquired), right foot: Secondary | ICD-10-CM | POA: Insufficient documentation

## 2014-09-09 DIAGNOSIS — M19079 Primary osteoarthritis, unspecified ankle and foot: Secondary | ICD-10-CM | POA: Insufficient documentation

## 2014-09-20 ENCOUNTER — Other Ambulatory Visit: Payer: Self-pay

## 2014-10-04 ENCOUNTER — Encounter (HOSPITAL_COMMUNITY): Payer: Self-pay

## 2014-10-04 ENCOUNTER — Emergency Department (HOSPITAL_COMMUNITY): Payer: Medicare Other

## 2014-10-04 ENCOUNTER — Emergency Department (HOSPITAL_COMMUNITY)
Admission: EM | Admit: 2014-10-04 | Discharge: 2014-10-05 | Disposition: A | Discharge status: Home / Self Care | Payer: Medicare Other | Attending: Emergency Medicine | Admitting: Emergency Medicine

## 2014-10-04 DIAGNOSIS — N390 Urinary tract infection, site not specified: Secondary | ICD-10-CM | POA: Insufficient documentation

## 2014-10-04 DIAGNOSIS — E669 Obesity, unspecified: Secondary | ICD-10-CM | POA: Insufficient documentation

## 2014-10-04 DIAGNOSIS — K2970 Gastritis, unspecified, without bleeding: Secondary | ICD-10-CM | POA: Diagnosis not present

## 2014-10-04 DIAGNOSIS — Z8701 Personal history of pneumonia (recurrent): Secondary | ICD-10-CM | POA: Insufficient documentation

## 2014-10-04 DIAGNOSIS — K219 Gastro-esophageal reflux disease without esophagitis: Secondary | ICD-10-CM | POA: Diagnosis not present

## 2014-10-04 DIAGNOSIS — Z86010 Personal history of colonic polyps: Secondary | ICD-10-CM | POA: Insufficient documentation

## 2014-10-04 DIAGNOSIS — Z862 Personal history of diseases of the blood and blood-forming organs and certain disorders involving the immune mechanism: Secondary | ICD-10-CM | POA: Insufficient documentation

## 2014-10-04 DIAGNOSIS — Z720 Tobacco use: Secondary | ICD-10-CM | POA: Insufficient documentation

## 2014-10-04 DIAGNOSIS — Z7952 Long term (current) use of systemic steroids: Secondary | ICD-10-CM | POA: Diagnosis not present

## 2014-10-04 DIAGNOSIS — R079 Chest pain, unspecified: Secondary | ICD-10-CM | POA: Diagnosis not present

## 2014-10-04 DIAGNOSIS — Z7982 Long term (current) use of aspirin: Secondary | ICD-10-CM | POA: Diagnosis not present

## 2014-10-04 DIAGNOSIS — Z79899 Other long term (current) drug therapy: Secondary | ICD-10-CM | POA: Insufficient documentation

## 2014-10-04 DIAGNOSIS — R600 Localized edema: Secondary | ICD-10-CM

## 2014-10-04 DIAGNOSIS — J449 Chronic obstructive pulmonary disease, unspecified: Secondary | ICD-10-CM | POA: Insufficient documentation

## 2014-10-04 DIAGNOSIS — M1990 Unspecified osteoarthritis, unspecified site: Secondary | ICD-10-CM | POA: Insufficient documentation

## 2014-10-04 DIAGNOSIS — R2243 Localized swelling, mass and lump, lower limb, bilateral: Secondary | ICD-10-CM | POA: Diagnosis present

## 2014-10-04 DIAGNOSIS — Z8669 Personal history of other diseases of the nervous system and sense organs: Secondary | ICD-10-CM | POA: Diagnosis not present

## 2014-10-04 DIAGNOSIS — Z87442 Personal history of urinary calculi: Secondary | ICD-10-CM | POA: Insufficient documentation

## 2014-10-04 DIAGNOSIS — Z87438 Personal history of other diseases of male genital organs: Secondary | ICD-10-CM | POA: Insufficient documentation

## 2014-10-04 DIAGNOSIS — I10 Essential (primary) hypertension: Secondary | ICD-10-CM | POA: Diagnosis not present

## 2014-10-04 DIAGNOSIS — E119 Type 2 diabetes mellitus without complications: Secondary | ICD-10-CM | POA: Insufficient documentation

## 2014-10-04 LAB — BRAIN NATRIURETIC PEPTIDE: B Natriuretic Peptide: 7.8 pg/mL (ref 0.0–100.0)

## 2014-10-04 LAB — BASIC METABOLIC PANEL
Anion gap: 11 (ref 5–15)
BUN: 13 mg/dL (ref 6–20)
CO2: 25 mmol/L (ref 22–32)
Calcium: 9.1 mg/dL (ref 8.9–10.3)
Chloride: 101 mmol/L (ref 101–111)
Creatinine, Ser: 1.3 mg/dL — ABNORMAL HIGH (ref 0.61–1.24)
GFR calc Af Amer: >60 mL/min (ref >60)
GFR calc non Af Amer: 53 mL/min — ABNORMAL LOW (ref >60)
Glucose, Bld: 109 mg/dL — ABNORMAL HIGH (ref 65–99)
Potassium: 3.5 mmol/L (ref 3.5–5.1)
Sodium: 137 mmol/L (ref 135–145)

## 2014-10-04 LAB — CBC
HCT: 41.6 % (ref 39.0–52.0)
Hemoglobin: 14.4 g/dL (ref 13.0–17.0)
MCH: 32.2 pg (ref 26.0–34.0)
MCHC: 34.6 g/dL (ref 30.0–36.0)
MCV: 93.1 fL (ref 78.0–100.0)
Platelets: 176 10*3/uL (ref 150–400)
RBC: 4.47 MIL/uL (ref 4.22–5.81)
RDW: 14.3 % (ref 11.5–15.5)
WBC: 6.3 10*3/uL (ref 4.0–10.5)

## 2014-10-04 LAB — I-STAT TROPONIN, ED: Troponin i, poc: 0 ng/mL (ref 0.00–0.08)

## 2014-10-04 NOTE — ED Notes (Signed)
Pt arrived to room

## 2014-10-04 NOTE — ED Notes (Signed)
Pt states he started having left sided chest tightness about 2000 and took his spiriva and states he seems hoarse. States he went on vacation and didn't watch his salt intake and both ankles are swollen.

## 2014-10-04 NOTE — ED Provider Notes (Signed)
CSN: [id number]     Arrival date & time 10/04/14  2055 History   First MD Initiated Contact with Patient 10/04/14 2157     Chief Complaint  Patient presents with  . Chest Pain  . Leg Swelling     (Consider location/radiation/quality/duration/timing/severity/associated sxs/prior Treatment) HPI Patient presents with concern of ongoing chest pressure, bilateral lower extremity swelling. Symptoms began over the past 3 or 4 days, as the patient was on vacation.  Patient acknowledges not taking all medication as directed, not maintaining a healthy diet while on vacation for 4 days. Other than that, the patient has no new medication, no new diet, no new activity or travel. Patient's chest pressure is not pain according to him, but is more discomfort. No ongoing lightheadedness, syncope, nausea, vomiting, loss of sensation anywhere.  Past Medical History  Diagnosis Date  . Nicotine addiction   . Obesity   . Dyslipidemia   . Hypertension   . Elevated WBCs September 2011    and UTI Hospitalized   . Erectile dysfunction   . Arthritis   . Polyneuropathy in other diseases classified elsewhere 12/05/2012  . Lesion of ulnar nerve 12/05/2012    Right side  . Gait disorder   . COPD (chronic obstructive pulmonary disease)     normally have breathing tx prior to surgery  . Pneumonia     hx  . History of kidney stones   . GERD (gastroesophageal reflux disease)   . Neuropathy   . Vertigo   . Diabetes mellitus without complication     bordrline no med  . Wears dentures     bottom  . Fatty liver   . Abdominal aortic aneurysm   . Vitamin D deficiency   . Osteoarthritis   . Lumbar spinal stenosis   . Gastritis   . Hiatal hernia   . History of colon polyps    Past Surgical History  Procedure Laterality Date  . Bladder surgery  02/2011    For Diverticulum of bladder removed  . Colon polyp resection    . Cysto    . Cholecystectomy N/A 02/24/2014    Procedure: LAPAROSCOPIC  CHOLECYSTECTOMY;  Surgeon: Coralie Keens, MD;  Location: [facility];  Service: General;  Laterality: N/A;  . Colonoscopy    . Carpal tunnel release Right 07/09/2014    Procedure: RIGHT OPEN CARPAL TUNNEL RELEASE;  Surgeon: Mcarthur Rossetti, MD;  Location: [facility];  Service: Orthopedics;  Laterality: Right;   Family History  Problem Relation Age of Onset  . Cancer Father     Asbestos  . Colon cancer Neg Hx   . Colon polyps Neg Hx   . Kidney disease Neg Hx   . Esophageal cancer Neg Hx   . Gallbladder disease Neg Hx   . Heart disease Neg Hx   . Diabetes Neg Hx    History  Substance Use Topics  . Smoking status: Current Every Day Smoker -- 0.50 packs/day for 35 years    Types: Cigarettes  . Smokeless tobacco: Never Used  . Alcohol Use: No    Review of Systems  Constitutional:       Per HPI, otherwise negative  HENT:       Per HPI, otherwise negative  Respiratory:       Per HPI, otherwise negative  Cardiovascular:       Per HPI, otherwise negative  Gastrointestinal: Negative for vomiting.  Endocrine:       Negative aside from HPI  Genitourinary:       Neg aside from HPI   Musculoskeletal:       Per HPI, otherwise negative  Skin: Negative.   Neurological: Negative for syncope.      Allergies  Ciprofloxacin and Levaquin  Home Medications   Prior to Admission medications   Medication Sig Start Date End Date Taking? Authorizing Provider  albuterol (PROVENTIL HFA;VENTOLIN HFA) 108 (90 BASE) MCG/ACT inhaler Inhale 1 puff into the lungs every 6 (six) hours as needed for wheezing or shortness of breath.    Historical Provider, MD  aspirin EC 81 MG tablet Take 81 mg by mouth daily.    Historical Provider, MD  atenolol-chlorthalidone (TENORETIC) 50-25 MG per tablet Take 1 tablet by mouth at bedtime.     Historical Provider, MD  Cyanocobalamin (VITAMIN B-12 PO) Take 1 tablet by mouth daily.    Historical Provider, MD  cyclobenzaprine (FLEXERIL) 10 MG  tablet Take 1 tablet (10 mg total) by mouth 2 (two) times daily between meals as needed for muscle spasms. 07/09/14   Mcarthur Rossetti, MD  esomeprazole (NEXIUM) 40 MG capsule Take 40 mg by mouth daily.     Historical Provider, MD  gabapentin (NEURONTIN) 300 MG capsule Take 300 mg by mouth at bedtime.    Historical Provider, MD  gabapentin (NEURONTIN) 400 MG capsule Take 400 mg by mouth 2 (two) times daily.     Historical Provider, MD  ipratropium-albuterol (DUONEB) 0.5-2.5 (3) MG/3ML SOLN Take 3 mLs by nebulization 3 (three) times daily as needed (wheezing).     Historical Provider, MD  loratadine-pseudoephedrine (CLARITIN-D 12-HOUR) 5-120 MG per tablet Take 1 tablet by mouth 2 (two) times daily.    Historical Provider, MD  meclizine (ANTIVERT) 50 MG tablet Take 50 mg by mouth 2 (two) times daily as needed for dizziness.     Historical Provider, MD  metFORMIN (GLUCOPHAGE) 500 MG tablet Take 500 mg by mouth 2 (two) times daily with a meal.     Historical Provider, MD  mupirocin cream (BACTROBAN) 2 % Apply 1 application topically daily as needed (Rash).    Historical Provider, MD  naproxen (NAPROSYN) 500 MG tablet Take 500 mg by mouth daily as needed (pain).    Historical Provider, MD  nitroGLYCERIN (NITROSTAT) 0.4 MG SL tablet Place 0.4 mg under the tongue every 5 (five) minutes as needed for chest pain.    Historical Provider, MD  oxyCODONE-acetaminophen (PERCOCET) 10-325 MG per tablet Take 1 tablet by mouth every 4 (four) hours as needed for pain. 02/24/14   Coralie Keens, MD  polyethylene glycol [facility] / Floria Raveling) packet Take 17 g by mouth daily as needed for mild constipation.    Historical Provider, MD  potassium chloride SA (K-DUR,KLOR-CON) 20 MEQ tablet Take 20 mEq by mouth daily.    Historical Provider, MD  predniSONE (DELTASONE) 10 MG tablet Take 20 mg by mouth daily.     Historical Provider, MD  ranitidine (ZANTAC) 150 MG tablet Take 150 mg by mouth 2 (two) times daily.     Historical Provider, MD  sildenafil (VIAGRA) 100 MG tablet Take 100 mg by mouth daily as needed for erectile dysfunction. One tablet 30 minutes before intercourse, maximum twice weekly    Historical Provider, MD  tamsulosin (FLOMAX) 0.4 MG CAPS capsule Take 0.4 mg by mouth 2 (two) times daily.    Historical Provider, MD  tiotropium (SPIRIVA) 18 MCG inhalation capsule Place 18 mcg into inhaler and inhale daily as needed (Shortness of breath).  Historical Provider, MD   BP 104/70 mmHg  Pulse 85  Temp(Src) 98.1 F (36.7 C) (Oral)  Resp 20  Ht '6\' 8"'$  (2.032 m)  Wt 276 lb 8 oz (125.42 kg)  BMI 30.38 kg/m2  SpO2 94% Physical Exam  Constitutional: He is oriented to person, place, and time. He appears well-developed. No distress.  HENT:  Head: Normocephalic and atraumatic.  Eyes: Conjunctivae and EOM are normal.  Cardiovascular: Normal rate and regular rhythm.   Pulmonary/Chest: Effort normal. No stridor. No respiratory distress.  Abdominal: He exhibits no distension. There is no tenderness. There is no rebound.  Musculoskeletal: He exhibits edema.  Symmetric bilateral 1+ pitting edema  Neurological: He is alert and oriented to person, place, and time.  Skin: Skin is warm and dry.  Psychiatric: He has a normal mood and affect.  Nursing note and vitals reviewed.   ED Course  Procedures (including critical care time) Labs Review Labs Reviewed  BASIC METABOLIC PANEL - Abnormal; Notable for the following:    Glucose, Bld 109 (*)    Creatinine, Ser 1.30 (*)    GFR calc non Af Amer 53 (*)    All other components within normal limits  URINALYSIS, ROUTINE W REFLEX MICROSCOPIC (NOT AT [facility]) - Abnormal; Notable for the following:    APPearance CLOUDY (*)    Nitrite POSITIVE (*)    Leukocytes, UA SMALL (*)    All other components within normal limits  URINE MICROSCOPIC-ADD ON - Abnormal; Notable for the following:    Bacteria, UA MANY (*)    All other components within normal limits   CBC  BRAIN NATRIURETIC PEPTIDE  I-STAT TROPOININ, ED    Imaging Review Dg Chest 2 View  10/04/2014   CLINICAL DATA:  74 year old male with history of chest pain and shortness of breath.  EXAM: CHEST  2 VIEW  COMPARISON:  Chest x-ray 07/22/2013.  FINDINGS: Lung volumes are normal. No consolidative airspace disease. No pleural effusions. Mild diffuse peribronchial cuffing, similar to the prior examination. No evidence of pulmonary edema. Heart size and mediastinal contours are within normal limits. Atherosclerosis in the thoracic aorta.  IMPRESSION: 1. Mild diffuse peribronchial cuffing appears to be chronic. No radiographic evidence of acute cardiopulmonary disease. 2. Atherosclerosis.   Electronically Signed   By: Vinnie Langton M.D.   On: 10/04/2014 21:24     EKG Interpretation   Date/Time:  Monday October 04 2014 20:58:53 EDT Ventricular Rate:  78 PR Interval:  148 QRS Duration: 86 QT Interval:  366 QTC Calculation: 417 R Axis:   67 Text Interpretation:  Sinus rhythm with marked sinus arrhythmia Otherwise  normal ECG Sinus rhythm Sinus arrhythmia Artifact Premature atrial  complexes Abnormal ekg Confirmed by Carmin Muskrat  MD ([id number]) on  10/04/2014 10:04:47 PM     Pulse ox 95% room air normal Cardiac 80 sinus normal  On repeat exam the patient appears calm. Vital signs remain similar. We discussed all findings, including evidence for urinary tract infection. Patient will start antibiotics, follow-up tomorrow with his primary care physician  MDM  Patient presents with multiple concerns, but primarily is worried about bilateral lower extremity swelling, generalized discomfort. Patient has COPD, baseline tightness, and this seems unchanged. No evidence for ongoing coronary ischemia, pneumonia. Patient is in no distress, afebrile, hemodynamically stable 4 hours of monitoring here. Patient does have evidence for urinary tract infection, likely contributing to his  illness. Patient tolerated initial antibodies well, was discharged in stable condition.  Carmin Muskrat, MD  10/05/14 0041 

## 2014-10-05 DIAGNOSIS — R2243 Localized swelling, mass and lump, lower limb, bilateral: Secondary | ICD-10-CM | POA: Diagnosis not present

## 2014-10-05 LAB — URINALYSIS, ROUTINE W REFLEX MICROSCOPIC
Bilirubin Urine: NEGATIVE
Glucose, UA: NEGATIVE mg/dL
Hgb urine dipstick: NEGATIVE
Ketones, ur: NEGATIVE mg/dL
Nitrite: POSITIVE — AB
Protein, ur: NEGATIVE mg/dL
Specific Gravity, Urine: 1.022 (ref 1.005–1.030)
Urobilinogen, UA: 1 mg/dL (ref 0.0–1.0)
pH: 6.5 (ref 5.0–8.0)

## 2014-10-05 LAB — URINE MICROSCOPIC-ADD ON

## 2014-10-05 MED ORDER — CEPHALEXIN 250 MG PO CAPS
500.0000 mg | ORAL_CAPSULE | Freq: Once | ORAL | Status: AC
  Administered 2014-10-05: 500 mg via ORAL
  Filled 2014-10-05: qty 2

## 2014-10-05 MED ORDER — CEPHALEXIN 500 MG PO CAPS: 500.0000 mg | ORAL_CAPSULE | Freq: Two times a day (BID) | ORAL | Status: AC

## 2014-10-05 NOTE — Discharge Instructions (Signed)
As discussed, it is important that you follow up in the next 2-3 days with your physician for continued management of your condition.  If you develop any new, or concerning changes in your condition, please return to the emergency department immediately.

## 2015-02-25 ENCOUNTER — Telehealth: Payer: Self-pay | Admitting: Gastroenterology

## 2015-02-28 NOTE — Telephone Encounter (Signed)
Patient of Dr Olevia Perches. He was diagnosed with gastroparesis by his PCP and started on Reglan. He calls with complaints of loose frequent stools. He wants to be seen sooner than the first available which is February. He declines to see any extender stating he does not deal with PA's. He does agree to call his diagnosing and prescribing PCP Dr Kennon Holter.

## 2015-03-01 NOTE — Telephone Encounter (Signed)
Patient will call his prescribing physician and ask if his dose can be reduced. Offered appointment. Declines.

## 2015-04-06 ENCOUNTER — Other Ambulatory Visit (HOSPITAL_COMMUNITY): Payer: Self-pay | Admitting: Orthopedic Surgery

## 2015-04-18 ENCOUNTER — Encounter (HOSPITAL_COMMUNITY): Payer: Self-pay

## 2015-04-18 ENCOUNTER — Encounter (HOSPITAL_COMMUNITY)
Admission: RE | Admit: 2015-04-18 | Discharge: 2015-04-18 | Disposition: A | Discharge status: Home / Self Care | Payer: Medicare Other | Source: Ambulatory Visit | Attending: Orthopedic Surgery | Admitting: Orthopedic Surgery

## 2015-04-18 DIAGNOSIS — F17210 Nicotine dependence, cigarettes, uncomplicated: Secondary | ICD-10-CM | POA: Diagnosis not present

## 2015-04-18 DIAGNOSIS — R0602 Shortness of breath: Secondary | ICD-10-CM | POA: Diagnosis not present

## 2015-04-18 DIAGNOSIS — E785 Hyperlipidemia, unspecified: Secondary | ICD-10-CM | POA: Diagnosis not present

## 2015-04-18 DIAGNOSIS — M19071 Primary osteoarthritis, right ankle and foot: Secondary | ICD-10-CM | POA: Diagnosis not present

## 2015-04-18 DIAGNOSIS — Z6829 Body mass index (BMI) 29.0-29.9, adult: Secondary | ICD-10-CM | POA: Diagnosis not present

## 2015-04-18 DIAGNOSIS — Z79899 Other long term (current) drug therapy: Secondary | ICD-10-CM | POA: Diagnosis not present

## 2015-04-18 DIAGNOSIS — E1140 Type 2 diabetes mellitus with diabetic neuropathy, unspecified: Secondary | ICD-10-CM | POA: Diagnosis not present

## 2015-04-18 DIAGNOSIS — E669 Obesity, unspecified: Secondary | ICD-10-CM | POA: Diagnosis not present

## 2015-04-18 DIAGNOSIS — K219 Gastro-esophageal reflux disease without esophagitis: Secondary | ICD-10-CM | POA: Diagnosis not present

## 2015-04-18 DIAGNOSIS — I10 Essential (primary) hypertension: Secondary | ICD-10-CM | POA: Diagnosis not present

## 2015-04-18 DIAGNOSIS — I714 Abdominal aortic aneurysm, without rupture: Secondary | ICD-10-CM | POA: Diagnosis not present

## 2015-04-18 DIAGNOSIS — Z7984 Long term (current) use of oral hypoglycemic drugs: Secondary | ICD-10-CM | POA: Diagnosis not present

## 2015-04-18 DIAGNOSIS — J449 Chronic obstructive pulmonary disease, unspecified: Secondary | ICD-10-CM | POA: Diagnosis not present

## 2015-04-18 HISTORY — DX: Major depressive disorder, single episode, unspecified: F32.9

## 2015-04-18 HISTORY — DX: Depression, unspecified: F32.A

## 2015-04-18 HISTORY — DX: Anxiety disorder, unspecified: F41.9

## 2015-04-18 LAB — CBC
HCT: 45.2 % (ref 39.0–52.0)
Hemoglobin: 15.5 g/dL (ref 13.0–17.0)
MCH: 32.4 pg (ref 26.0–34.0)
MCHC: 34.3 g/dL (ref 30.0–36.0)
MCV: 94.4 fL (ref 78.0–100.0)
Platelets: 165 10*3/uL (ref 150–400)
RBC: 4.79 MIL/uL (ref 4.22–5.81)
RDW: 13.7 % (ref 11.5–15.5)
WBC: 7.1 10*3/uL (ref 4.0–10.5)

## 2015-04-18 LAB — COMPREHENSIVE METABOLIC PANEL
ALT: 15 U/L — ABNORMAL LOW (ref 17–63)
AST: 17 U/L (ref 15–41)
Albumin: 3.7 g/dL (ref 3.5–5.0)
Alkaline Phosphatase: 43 U/L (ref 38–126)
Anion gap: 11 (ref 5–15)
BUN: 7 mg/dL (ref 6–20)
CO2: 26 mmol/L (ref 22–32)
Calcium: 9.3 mg/dL (ref 8.9–10.3)
Chloride: 102 mmol/L (ref 101–111)
Creatinine, Ser: 1.22 mg/dL (ref 0.61–1.24)
GFR calc Af Amer: >60 mL/min (ref >60)
GFR calc non Af Amer: 57 mL/min — ABNORMAL LOW (ref >60)
Glucose, Bld: 111 mg/dL — ABNORMAL HIGH (ref 65–99)
Potassium: 3.9 mmol/L (ref 3.5–5.1)
Sodium: 139 mmol/L (ref 135–145)
Total Bilirubin: 0.7 mg/dL (ref 0.3–1.2)
Total Protein: 7.3 g/dL (ref 6.5–8.1)

## 2015-04-18 LAB — APTT: aPTT: 32 seconds (ref 24–37)

## 2015-04-18 LAB — PROTIME-INR
INR: 1.09 (ref 0.00–1.49)
Prothrombin Time: 14.3 seconds (ref 11.6–15.2)

## 2015-04-18 LAB — GLUCOSE, CAPILLARY: Glucose-Capillary: 121 mg/dL — ABNORMAL HIGH (ref 65–99)

## 2015-04-18 LAB — SURGICAL PCR SCREEN
MRSA, PCR: NEGATIVE
Staphylococcus aureus: NEGATIVE

## 2015-04-18 NOTE — Progress Notes (Signed)
PCP is Dr. Wenda Overland Denies seeing a cardiologist. Echo noted from 10-2012. Denies ever having a card cath or stress test. Reports his fasting CBG's run in the 140's

## 2015-04-18 NOTE — Pre-Procedure Instructions (Addendum)
Jonathan [name]  04/18/2015      [facility] - [address] [place] [address] Phone: [phone] Fax: [phone]    Your procedure is scheduled on Wednesday January 25th   Report to [facility] Admitting at 6:30 A.M.   Call this number if you have problems the morning of surgery: [phone]   Remember:  Do not eat food or drink liquids after midnight.    Take these medicines the morning of surgery with A SIP OF WATER: Inhalers and nebulizer if needed, Nexium, Gabapentin (neurontin), Oxycodone (percocet) if needed, Zantac, prednisone if needed, Claritin D if needed,  Atenolol  Stop taking aspirin, aspirin containing products, Nsaids, Ibuprofin (advil, motrin), Naproxin (aleve), vitamins and herbal products How to Manage Your Diabetes Before Surgery   Why is it important to control my blood sugar before and after surgery?   Improving blood sugar levels before and after surgery helps healing and can limit problems.  A way of improving blood sugar control is eating a healthy diet by:  - Eating less sugar and carbohydrates  - Increasing activity/exercise  - Talk with your doctor about reaching your blood sugar goals  High blood sugars (greater than 180 mg/dL) can raise your risk of infections and slow down your recovery so you will need to focus on controlling your diabetes during the weeks before surgery.  Make sure that the doctor who takes care of your diabetes knows about your planned surgery including the date and location.  How do I manage my blood sugars before surgery?   Check your blood sugar at least 4 times a day, 2 days before surgery to make sure that they are not too high or low.   Check your blood sugar the morning of your surgery when you wake up and every 2               hours until you get to the Short-Stay unit.  If your blood sugar is less than 70 mg/dL, you will need  to treat for low blood sugar by:  Treat a low blood sugar (less than 70 mg/dL) with 1/2 cup of clear juice (cranberry or apple), 4 glucose tablets, OR glucose gel.  Recheck blood sugar in 15 minutes after treatment (to make sure it is greater than 70 mg/dL).  If blood sugar is not greater than 70 mg/dL on re-check, call [phone] for further instructions.   Report your blood sugar to the Short-Stay nurse when you get to Short-Stay.  References:  [employer], 2007 "How to Manage your Diabetes Before and After Surgery".  What do I do about my diabetes medications?   Do not take oral diabetes medicines (pills) the morning of surgery.   Do not wear jewelry  Do not wear lotions, powders, or perfumes.  You may not wear deodorant.  Do not shave 48 hours prior to surgery.  Men may shave face and neck.   Do not bring valuables to the hospital.  [facility] is not responsible for any belongings or valuables.  Contacts, dentures or bridgework may not be worn into surgery.  Leave your suitcase in the car.  After surgery it may be brought to your room.  For patients admitted to the hospital, discharge time will be determined by your treatment team.  Patients discharged the day of surgery will not be allowed to drive home  Please read over the following fact sheets that you were given. Pain Booklet, Coughing and Deep Breathing and Surgical Site Infection Prevention

## 2015-04-19 LAB — HEMOGLOBIN A1C
Hgb A1c MFr Bld: 6.6 % — ABNORMAL HIGH (ref 4.8–5.6)
Mean Plasma Glucose: 143 mg/dL

## 2015-04-19 MED ORDER — DEXTROSE 5 % IV SOLN
3.0000 g | INTRAVENOUS | Status: AC
  Administered 2015-04-20: 3 g via INTRAVENOUS
  Filled 2015-04-19: qty 3000

## 2015-04-19 MED ORDER — CHLORHEXIDINE GLUCONATE 4 % EX LIQD: 60.0000 mL | Freq: Once | CUTANEOUS | Status: DC

## 2015-04-20 ENCOUNTER — Encounter (HOSPITAL_COMMUNITY): Payer: Self-pay | Admitting: *Deleted

## 2015-04-20 ENCOUNTER — Inpatient Hospital Stay (HOSPITAL_COMMUNITY): Payer: Medicare Other

## 2015-04-20 ENCOUNTER — Encounter (HOSPITAL_COMMUNITY)
Admission: RE | Disposition: A | Discharge status: Home / Self Care | Payer: Self-pay | Source: Ambulatory Visit | Attending: Orthopedic Surgery

## 2015-04-20 ENCOUNTER — Inpatient Hospital Stay (HOSPITAL_COMMUNITY): Payer: Medicare Other | Admitting: Anesthesiology

## 2015-04-20 ENCOUNTER — Ambulatory Visit (HOSPITAL_COMMUNITY)
Admission: RE | Admit: 2015-04-20 | Discharge: 2015-04-21 | Disposition: A | Discharge status: Home / Self Care | Payer: Medicare Other | Source: Ambulatory Visit | Attending: Orthopedic Surgery | Admitting: Orthopedic Surgery

## 2015-04-20 DIAGNOSIS — Z96661 Presence of right artificial ankle joint: Secondary | ICD-10-CM

## 2015-04-20 DIAGNOSIS — K219 Gastro-esophageal reflux disease without esophagitis: Secondary | ICD-10-CM | POA: Insufficient documentation

## 2015-04-20 DIAGNOSIS — M19071 Primary osteoarthritis, right ankle and foot: Principal | ICD-10-CM | POA: Insufficient documentation

## 2015-04-20 DIAGNOSIS — F17210 Nicotine dependence, cigarettes, uncomplicated: Secondary | ICD-10-CM | POA: Diagnosis not present

## 2015-04-20 DIAGNOSIS — J449 Chronic obstructive pulmonary disease, unspecified: Secondary | ICD-10-CM | POA: Insufficient documentation

## 2015-04-20 DIAGNOSIS — M19271 Secondary osteoarthritis, right ankle and foot: Secondary | ICD-10-CM | POA: Diagnosis not present

## 2015-04-20 DIAGNOSIS — R0602 Shortness of breath: Secondary | ICD-10-CM | POA: Insufficient documentation

## 2015-04-20 DIAGNOSIS — Z471 Aftercare following joint replacement surgery: Secondary | ICD-10-CM | POA: Diagnosis not present

## 2015-04-20 DIAGNOSIS — G8918 Other acute postprocedural pain: Secondary | ICD-10-CM | POA: Diagnosis not present

## 2015-04-20 DIAGNOSIS — E1142 Type 2 diabetes mellitus with diabetic polyneuropathy: Secondary | ICD-10-CM | POA: Diagnosis not present

## 2015-04-20 DIAGNOSIS — Z419 Encounter for procedure for purposes other than remedying health state, unspecified: Secondary | ICD-10-CM

## 2015-04-20 DIAGNOSIS — Z6829 Body mass index (BMI) 29.0-29.9, adult: Secondary | ICD-10-CM | POA: Diagnosis not present

## 2015-04-20 DIAGNOSIS — E669 Obesity, unspecified: Secondary | ICD-10-CM | POA: Diagnosis not present

## 2015-04-20 DIAGNOSIS — M1990 Unspecified osteoarthritis, unspecified site: Secondary | ICD-10-CM | POA: Diagnosis not present

## 2015-04-20 DIAGNOSIS — S82839D Other fracture of upper and lower end of unspecified fibula, subsequent encounter for closed fracture with routine healing: Secondary | ICD-10-CM | POA: Diagnosis not present

## 2015-04-20 DIAGNOSIS — E1140 Type 2 diabetes mellitus with diabetic neuropathy, unspecified: Secondary | ICD-10-CM | POA: Insufficient documentation

## 2015-04-20 DIAGNOSIS — I714 Abdominal aortic aneurysm, without rupture: Secondary | ICD-10-CM | POA: Insufficient documentation

## 2015-04-20 DIAGNOSIS — E785 Hyperlipidemia, unspecified: Secondary | ICD-10-CM | POA: Insufficient documentation

## 2015-04-20 DIAGNOSIS — Z7984 Long term (current) use of oral hypoglycemic drugs: Secondary | ICD-10-CM | POA: Insufficient documentation

## 2015-04-20 DIAGNOSIS — Z79899 Other long term (current) drug therapy: Secondary | ICD-10-CM | POA: Insufficient documentation

## 2015-04-20 DIAGNOSIS — I10 Essential (primary) hypertension: Secondary | ICD-10-CM | POA: Insufficient documentation

## 2015-04-20 HISTORY — PX: TOTAL ANKLE ARTHROPLASTY: SHX811

## 2015-04-20 LAB — GLUCOSE, CAPILLARY
Glucose-Capillary: 101 mg/dL — ABNORMAL HIGH (ref 65–99)
Glucose-Capillary: 122 mg/dL — ABNORMAL HIGH (ref 65–99)
Glucose-Capillary: 127 mg/dL — ABNORMAL HIGH (ref 65–99)
Glucose-Capillary: 138 mg/dL — ABNORMAL HIGH (ref 65–99)
Glucose-Capillary: 142 mg/dL — ABNORMAL HIGH (ref 65–99)
Glucose-Capillary: 174 mg/dL — ABNORMAL HIGH (ref 65–99)

## 2015-04-20 SURGERY — ARTHROPLASTY, ANKLE, TOTAL
Anesthesia: Regional | Site: Ankle | Laterality: Right

## 2015-04-20 MED ORDER — LIDOCAINE HCL (CARDIAC) 20 MG/ML IV SOLN
INTRAVENOUS | Status: AC
  Filled 2015-04-20: qty 5

## 2015-04-20 MED ORDER — BISACODYL 10 MG RE SUPP: 10.0000 mg | Freq: Every day | RECTAL | Status: DC | PRN

## 2015-04-20 MED ORDER — PHENYLEPHRINE 40 MCG/ML (10ML) SYRINGE FOR IV PUSH (FOR BLOOD PRESSURE SUPPORT)
PREFILLED_SYRINGE | INTRAVENOUS | Status: AC
Start: 2015-04-20 — End: 2015-04-20
  Filled 2015-04-20: qty 10

## 2015-04-20 MED ORDER — GABAPENTIN 400 MG PO CAPS
400.0000 mg | ORAL_CAPSULE | Freq: Two times a day (BID) | ORAL | Status: DC
  Administered 2015-04-20 (×2): 400 mg via ORAL
  Filled 2015-04-20 (×2): qty 1

## 2015-04-20 MED ORDER — PHENYLEPHRINE 40 MCG/ML (10ML) SYRINGE FOR IV PUSH (FOR BLOOD PRESSURE SUPPORT)
PREFILLED_SYRINGE | INTRAVENOUS | Status: AC
  Filled 2015-04-20: qty 10

## 2015-04-20 MED ORDER — FUROSEMIDE 40 MG PO TABS: 40.0000 mg | ORAL_TABLET | Freq: Every day | ORAL | Status: DC | PRN

## 2015-04-20 MED ORDER — POTASSIUM CHLORIDE CRYS ER 20 MEQ PO TBCR
10.0000 meq | EXTENDED_RELEASE_TABLET | Freq: Every day | ORAL | Status: DC
  Administered 2015-04-20: 10 meq via ORAL
  Filled 2015-04-20 (×2): qty 1

## 2015-04-20 MED ORDER — PANTOPRAZOLE SODIUM 40 MG PO TBEC: 40.0000 mg | DELAYED_RELEASE_TABLET | Freq: Every day | ORAL | Status: DC

## 2015-04-20 MED ORDER — ONDANSETRON HCL 4 MG PO TABS: 4.0000 mg | ORAL_TABLET | Freq: Four times a day (QID) | ORAL | Status: DC | PRN

## 2015-04-20 MED ORDER — MIDAZOLAM HCL 5 MG/5ML IJ SOLN
INTRAMUSCULAR | Status: DC | PRN
  Administered 2015-04-20 (×2): 1 mg via INTRAVENOUS

## 2015-04-20 MED ORDER — METOCLOPRAMIDE HCL 5 MG PO TABS: 5.0000 mg | ORAL_TABLET | Freq: Three times a day (TID) | ORAL | Status: DC | PRN

## 2015-04-20 MED ORDER — METOCLOPRAMIDE HCL 5 MG PO TABS
5.0000 mg | ORAL_TABLET | Freq: Four times a day (QID) | ORAL | Status: DC
  Administered 2015-04-20 – 2015-04-21 (×2): 5 mg via ORAL
  Filled 2015-04-20 (×2): qty 1

## 2015-04-20 MED ORDER — PHENYLEPHRINE HCL 10 MG/ML IJ SOLN
INTRAMUSCULAR | Status: DC | PRN
  Administered 2015-04-20 (×3): 80 ug via INTRAVENOUS

## 2015-04-20 MED ORDER — SODIUM CHLORIDE 0.9 % IV SOLN: INTRAVENOUS | Status: DC

## 2015-04-20 MED ORDER — ACETAMINOPHEN 325 MG PO TABS: 325.0000 mg | ORAL_TABLET | ORAL | Status: DC | PRN

## 2015-04-20 MED ORDER — EPHEDRINE SULFATE 50 MG/ML IJ SOLN
INTRAMUSCULAR | Status: AC
  Filled 2015-04-20: qty 1

## 2015-04-20 MED ORDER — MEPIVACAINE HCL 1.5 % IJ SOLN
INTRAMUSCULAR | Status: DC | PRN
  Administered 2015-04-20: 10 mL via PERINEURAL
  Administered 2015-04-20: 5 mL via PERINEURAL

## 2015-04-20 MED ORDER — TAMSULOSIN HCL 0.4 MG PO CAPS: 0.4000 mg | ORAL_CAPSULE | Freq: Every day | ORAL | Status: DC | PRN

## 2015-04-20 MED ORDER — PROPOFOL 10 MG/ML IV BOLUS
INTRAVENOUS | Status: AC
  Filled 2015-04-20: qty 20

## 2015-04-20 MED ORDER — HYDROMORPHONE HCL 1 MG/ML IJ SOLN: 0.2500 mg | INTRAMUSCULAR | Status: DC | PRN

## 2015-04-20 MED ORDER — PROPOFOL 10 MG/ML IV BOLUS
INTRAVENOUS | Status: DC | PRN
  Administered 2015-04-20: 160 mg via INTRAVENOUS

## 2015-04-20 MED ORDER — OXYCODONE HCL 5 MG/5ML PO SOLN: 5.0000 mg | Freq: Once | ORAL | Status: DC | PRN

## 2015-04-20 MED ORDER — HYDROMORPHONE HCL 1 MG/ML IJ SOLN: 1.0000 mg | INTRAMUSCULAR | Status: DC | PRN

## 2015-04-20 MED ORDER — ONDANSETRON HCL 4 MG/2ML IJ SOLN
INTRAMUSCULAR | Status: DC | PRN
  Administered 2015-04-20: 4 mg via INTRAVENOUS

## 2015-04-20 MED ORDER — SODIUM CHLORIDE 0.9 % IJ SOLN
INTRAMUSCULAR | Status: AC
  Filled 2015-04-20: qty 10

## 2015-04-20 MED ORDER — BUPIVACAINE-EPINEPHRINE (PF) 0.5% -1:200000 IJ SOLN
INTRAMUSCULAR | Status: DC | PRN
  Administered 2015-04-20: 20 mL via PERINEURAL
  Administered 2015-04-20: 10 mL via PERINEURAL

## 2015-04-20 MED ORDER — PHENYLEPHRINE HCL 10 MG/ML IJ SOLN
10.0000 mg | INTRAVENOUS | Status: DC | PRN
  Administered 2015-04-20: 25 ug/min via INTRAVENOUS

## 2015-04-20 MED ORDER — ROCURONIUM BROMIDE 50 MG/5ML IV SOLN
INTRAVENOUS | Status: AC
  Filled 2015-04-20: qty 1

## 2015-04-20 MED ORDER — VANCOMYCIN HCL 1000 MG IV SOLR
INTRAVENOUS | Status: AC
  Filled 2015-04-20: qty 1000

## 2015-04-20 MED ORDER — METHOCARBAMOL 1000 MG/10ML IJ SOLN: 500.0000 mg | Freq: Four times a day (QID) | INTRAVENOUS | Status: DC | PRN

## 2015-04-20 MED ORDER — ACETAMINOPHEN 325 MG PO TABS
650.0000 mg | ORAL_TABLET | Freq: Four times a day (QID) | ORAL | Status: DC | PRN
  Administered 2015-04-21: 650 mg via ORAL
  Filled 2015-04-20: qty 2

## 2015-04-20 MED ORDER — MIDAZOLAM HCL 2 MG/2ML IJ SOLN
INTRAMUSCULAR | Status: AC
  Filled 2015-04-20: qty 2

## 2015-04-20 MED ORDER — SUCCINYLCHOLINE CHLORIDE 20 MG/ML IJ SOLN
INTRAMUSCULAR | Status: AC
  Filled 2015-04-20: qty 1

## 2015-04-20 MED ORDER — EPHEDRINE SULFATE 50 MG/ML IJ SOLN
INTRAMUSCULAR | Status: DC | PRN
  Administered 2015-04-20 (×2): 10 mg via INTRAVENOUS

## 2015-04-20 MED ORDER — OXYCODONE HCL 5 MG PO TABS: 5.0000 mg | ORAL_TABLET | Freq: Once | ORAL | Status: DC | PRN

## 2015-04-20 MED ORDER — LIDOCAINE HCL (CARDIAC) 20 MG/ML IV SOLN
INTRAVENOUS | Status: DC | PRN
Start: 2015-04-20 — End: 2015-04-20
  Administered 2015-04-20: 80 mg via INTRAVENOUS

## 2015-04-20 MED ORDER — ACETAMINOPHEN 650 MG RE SUPP: 650.0000 mg | Freq: Four times a day (QID) | RECTAL | Status: DC | PRN

## 2015-04-20 MED ORDER — METOCLOPRAMIDE HCL 5 MG/ML IJ SOLN
INTRAMUSCULAR | Status: AC
  Filled 2015-04-20: qty 2

## 2015-04-20 MED ORDER — FENTANYL CITRATE (PF) 250 MCG/5ML IJ SOLN
INTRAMUSCULAR | Status: AC
  Filled 2015-04-20: qty 5

## 2015-04-20 MED ORDER — OXYCODONE HCL 5 MG PO TABS: 10.0000 mg | ORAL_TABLET | Freq: Three times a day (TID) | ORAL | Status: DC | PRN

## 2015-04-20 MED ORDER — 0.9 % SODIUM CHLORIDE (POUR BTL) OPTIME
TOPICAL | Status: DC | PRN
  Administered 2015-04-20: 1000 mL

## 2015-04-20 MED ORDER — MECLIZINE HCL 25 MG PO TABS: 50.0000 mg | ORAL_TABLET | Freq: Two times a day (BID) | ORAL | Status: DC | PRN

## 2015-04-20 MED ORDER — ATENOLOL-CHLORTHALIDONE 50-25 MG PO TABS: 1.0000 | ORAL_TABLET | Freq: Every day | ORAL | Status: DC

## 2015-04-20 MED ORDER — POLYETHYLENE GLYCOL 3350 17 G PO PACK
17.0000 g | PACK | Freq: Every day | ORAL | Status: DC | PRN
Start: 2015-04-20 — End: 2015-04-21

## 2015-04-20 MED ORDER — ONDANSETRON HCL 4 MG/2ML IJ SOLN
INTRAMUSCULAR | Status: AC
  Filled 2015-04-20: qty 2

## 2015-04-20 MED ORDER — DEXTROSE 5 % IV SOLN
3.0000 g | Freq: Four times a day (QID) | INTRAVENOUS | Status: AC
  Administered 2015-04-20 – 2015-04-21 (×3): 3 g via INTRAVENOUS
  Filled 2015-04-20 (×5): qty 3000

## 2015-04-20 MED ORDER — FENTANYL CITRATE (PF) 100 MCG/2ML IJ SOLN
INTRAMUSCULAR | Status: DC | PRN
  Administered 2015-04-20 (×4): 50 ug via INTRAVENOUS

## 2015-04-20 MED ORDER — METHOCARBAMOL 500 MG PO TABS: 500.0000 mg | ORAL_TABLET | Freq: Four times a day (QID) | ORAL | Status: DC | PRN

## 2015-04-20 MED ORDER — IPRATROPIUM-ALBUTEROL 0.5-2.5 (3) MG/3ML IN SOLN: 3.0000 mL | Freq: Three times a day (TID) | RESPIRATORY_TRACT | Status: DC | PRN

## 2015-04-20 MED ORDER — ONDANSETRON HCL 4 MG/2ML IJ SOLN
4.0000 mg | Freq: Four times a day (QID) | INTRAMUSCULAR | Status: DC | PRN
  Administered 2015-04-20: 4 mg via INTRAVENOUS

## 2015-04-20 MED ORDER — ACETAMINOPHEN 160 MG/5ML PO SOLN: 325.0000 mg | ORAL | Status: DC | PRN

## 2015-04-20 MED ORDER — CHLORTHALIDONE 25 MG PO TABS
25.0000 mg | ORAL_TABLET | Freq: Every day | ORAL | Status: DC
  Filled 2015-04-20: qty 1

## 2015-04-20 MED ORDER — ALBUTEROL SULFATE (2.5 MG/3ML) 0.083% IN NEBU
2.5000 mg | INHALATION_SOLUTION | Freq: Four times a day (QID) | RESPIRATORY_TRACT | Status: DC | PRN
Start: 2015-04-20 — End: 2015-04-21

## 2015-04-20 MED ORDER — ATENOLOL 50 MG PO TABS: 50.0000 mg | ORAL_TABLET | Freq: Every day | ORAL | Status: DC

## 2015-04-20 MED ORDER — METFORMIN HCL 500 MG PO TABS: 250.0000 mg | ORAL_TABLET | Freq: Every day | ORAL | Status: DC

## 2015-04-20 MED ORDER — ASPIRIN EC 325 MG PO TBEC: 325.0000 mg | DELAYED_RELEASE_TABLET | Freq: Every day | ORAL | Status: DC

## 2015-04-20 MED ORDER — METOCLOPRAMIDE HCL 5 MG/ML IJ SOLN
5.0000 mg | Freq: Three times a day (TID) | INTRAMUSCULAR | Status: DC | PRN
  Administered 2015-04-20: 10 mg via INTRAVENOUS

## 2015-04-20 MED ORDER — TIOTROPIUM BROMIDE MONOHYDRATE 18 MCG IN CAPS
18.0000 ug | ORAL_CAPSULE | Freq: Every day | RESPIRATORY_TRACT | Status: DC | PRN
  Administered 2015-04-21: 18 ug via RESPIRATORY_TRACT

## 2015-04-20 SURGICAL SUPPLY — 90 items
BANDAGE ESMARK 6X9 LF (GAUZE/BANDAGES/DRESSINGS) ×1 IMPLANT
BIT DRILL 110X2.5XQCK CNCT (BIT) IMPLANT
BIT DRILL 2.5 (BIT) ×3
BIT DRILL STD 2.0MM (DRILL) IMPLANT
BIT DRL 110X2.5XQCK CNCT (BIT) ×1
BLADE LONG MED 31MMX9MM (MISCELLANEOUS) ×1
BLADE LONG MED 31X9 (MISCELLANEOUS) ×1 IMPLANT
BLADE RECIPRO TAPERED (BLADE) ×1 IMPLANT
BLADE SURG 15 STRL LF DISP TIS (BLADE) ×2 IMPLANT
BLADE SURG 15 STRL SS (BLADE) ×6
BNDG CMPR 9X6 STRL LF SNTH (GAUZE/BANDAGES/DRESSINGS)
BNDG COHESIVE 4X5 TAN STRL (GAUZE/BANDAGES/DRESSINGS) ×2 IMPLANT
BNDG COHESIVE 6X5 TAN STRL LF (GAUZE/BANDAGES/DRESSINGS) ×2 IMPLANT
BNDG ESMARK 6X9 LF (GAUZE/BANDAGES/DRESSINGS)
BNDG GAUZE ELAST 4 BULKY (GAUZE/BANDAGES/DRESSINGS) ×2 IMPLANT
BUR ANKLE TM (BUR) ×4 IMPLANT
CANISTER SUCT 3000ML PPV (MISCELLANEOUS) ×1 IMPLANT
CHLORAPREP W/TINT 26ML (MISCELLANEOUS) ×1 IMPLANT
COVER SURGICAL LIGHT HANDLE (MISCELLANEOUS) ×4 IMPLANT
CUFF TOURNIQUET SINGLE 34IN LL (TOURNIQUET CUFF) ×3 IMPLANT
CUFF TOURNIQUET SINGLE 44IN (TOURNIQUET CUFF) IMPLANT
DRAPE C-ARM 42X72 X-RAY (DRAPES) ×3 IMPLANT
DRAPE C-ARMOR (DRAPES) ×3 IMPLANT
DRAPE EXTREMITY T 121X128X90 (DRAPE) ×3 IMPLANT
DRAPE IMP U-DRAPE 54X76 (DRAPES) ×3 IMPLANT
DRAPE OEC MINIVIEW 54X84 (DRAPES) ×2 IMPLANT
DRAPE ORTHO SPLIT 77X108 STRL (DRAPES) ×3
DRAPE SURG ORHT 6 SPLT 77X108 (DRAPES) ×1 IMPLANT
DRAPE U-SHAPE 47X51 STRL (DRAPES) ×3 IMPLANT
DRILL STANDARD 2.0MM (DRILL) ×3
DRSG ADAPTIC 3X8 NADH LF (GAUZE/BANDAGES/DRESSINGS) ×2 IMPLANT
DRSG MEPILEX BORDER 4X4 (GAUZE/BANDAGES/DRESSINGS) ×1 IMPLANT
DRSG MEPITEL 4X7.2 (GAUZE/BANDAGES/DRESSINGS) ×1 IMPLANT
DRSG PAD ABDOMINAL 8X10 ST (GAUZE/BANDAGES/DRESSINGS) ×3 IMPLANT
ELECT REM PT RETURN 9FT ADLT (ELECTROSURGICAL) ×3
ELECTRODE REM PT RTRN 9FT ADLT (ELECTROSURGICAL) ×1 IMPLANT
EVACUATOR 1/8 PVC DRAIN (DRAIN) ×1 IMPLANT
GAUZE SPONGE 4X4 12PLY STRL (GAUZE/BANDAGES/DRESSINGS) ×2 IMPLANT
GLOVE BIO SURGEON STRL SZ7 (GLOVE) ×4 IMPLANT
GLOVE BIO SURGEON STRL SZ8 (GLOVE) ×1 IMPLANT
GLOVE BIOGEL PI IND STRL 7.5 (GLOVE) ×1 IMPLANT
GLOVE BIOGEL PI IND STRL 8 (GLOVE) ×1 IMPLANT
GLOVE BIOGEL PI INDICATOR 7.5 (GLOVE) ×2
GLOVE BIOGEL PI INDICATOR 8 (GLOVE)
GOWN STRL REUS W/ TWL LRG LVL3 (GOWN DISPOSABLE) ×2 IMPLANT
GOWN STRL REUS W/ TWL XL LVL3 (GOWN DISPOSABLE) ×1 IMPLANT
GOWN STRL REUS W/TWL LRG LVL3 (GOWN DISPOSABLE) ×6
GOWN STRL REUS W/TWL XL LVL3 (GOWN DISPOSABLE) ×3
K-WIRE ANKLE TM 1.6 (WIRE) ×2 IMPLANT
KIT BASIN OR (CUSTOM PROCEDURE TRAY) ×3 IMPLANT
KIT ROOM TURNOVER OR (KITS) ×3 IMPLANT
NS IRRIG 1000ML POUR BTL (IV SOLUTION) ×3 IMPLANT
PACK TOTAL JOINT (CUSTOM PROCEDURE TRAY) ×3 IMPLANT
PACK UNIVERSAL I (CUSTOM PROCEDURE TRAY) ×3 IMPLANT
PAD ABD 8X10 STRL (GAUZE/BANDAGES/DRESSINGS) ×4 IMPLANT
PAD ARMBOARD 7.5X6 YLW CONV (MISCELLANEOUS) ×6 IMPLANT
PAD CAST 4YDX4 CTTN HI CHSV (CAST SUPPLIES) ×3 IMPLANT
PADDING CAST COTTON 4X4 STRL (CAST SUPPLIES)
PADDING CAST COTTON 6X4 STRL (CAST SUPPLIES) IMPLANT
PIN ANKLE TM 4X1500 (PIN) ×2 IMPLANT
PIN ANKLE TM 5X200 (PIN) ×4 IMPLANT
PIN CALCANEUS (PIN) ×2 IMPLANT
PLATE FIBULA LOCK 8HOLE RIGHT (Plate) ×2 IMPLANT
SCREW 3.5X16MM (Screw) ×6 IMPLANT
SCREW BN 2.7X16X3.5XST NS (Screw) IMPLANT
SCREW CORTICAL 3.5X18 (Screw) ×4 IMPLANT
SCREW LOCK 16X2.7X (Screw) IMPLANT
SCREW LOCKING 2.7X16 (Screw) ×3 IMPLANT
SCREW PERI 3.5X14MM W/2.7 (Screw) ×2 IMPLANT
SCREW PERI CORTICAL 3.5X20MM (Screw) ×6 IMPLANT
SCREW PERI CORTICAL 3.5X22MM (Screw) ×2 IMPLANT
SCREW PERI CORTICAL 3.5X24MM (Screw) ×2 IMPLANT
SLEEVE PROTECTIVE (Orthopedic Implant) ×2 IMPLANT
SPONGE GAUZE 4X4 12PLY STER LF (GAUZE/BANDAGES/DRESSINGS) ×2 IMPLANT
SUCTION FRAZIER HANDLE 10FR (MISCELLANEOUS)
SUCTION TUBE FRAZIER 10FR DISP (MISCELLANEOUS) ×1 IMPLANT
SURGILUBE 3G PEEL PACK STRL (MISCELLANEOUS) IMPLANT
SUT ETHILON 2 0 PSLX (SUTURE) ×4 IMPLANT
SUT ETHILON 3 0 PS 1 (SUTURE) ×3 IMPLANT
SUT MNCRL AB 3-0 PS2 18 (SUTURE) ×6 IMPLANT
SUT PROLENE 3 0 PS 2 (SUTURE) ×5 IMPLANT
SUT VIC AB 0 CT1 27 (SUTURE) ×3
SUT VIC AB 0 CT1 27XBRD ANBCTR (SUTURE) ×1 IMPLANT
SYR BULB IRRIGATION 50ML (SYRINGE) ×2 IMPLANT
TALUS TOTAL ANKLE RIGHT SZ 6 (Orthopedic Implant) ×2 IMPLANT
TIBIA TOTAL ANKLE SZ 6 (Orthopedic Implant) ×2 IMPLANT
TIBIAL INSERT PROLONG SZ6 +0 (Orthopedic Implant) ×2 IMPLANT
TOWEL OR 17X24 6PK STRL BLUE (TOWEL DISPOSABLE) ×3 IMPLANT
TOWEL OR 17X26 10 PK STRL BLUE (TOWEL DISPOSABLE) ×3 IMPLANT
WATER STERILE IRR 1000ML POUR (IV SOLUTION) ×1 IMPLANT

## 2015-04-20 NOTE — Anesthesia Procedure Notes (Addendum)
Procedure Name: LMA Insertion Date/Time: 04/20/2015 8:46 AM Performed by: Greggory Stallion, MICHAEL L Pre-anesthesia Checklist: Patient identified, Emergency Drugs available, Suction available, Patient being monitored and Timeout performed Patient Re-evaluated:Patient Re-evaluated prior to inductionOxygen Delivery Method: Circle system utilized Preoxygenation: Pre-oxygenation with 100% oxygen Intubation Type: IV induction Ventilation: Mask ventilation without difficulty LMA: LMA inserted LMA Size: 5.0 Number of attempts: 1 Placement Confirmation: positive ETCO2 and breath sounds checked- equal and bilateral Tube secured with: Tape Dental Injury: Teeth and Oropharynx as per pre-operative assessment    Anesthesia Regional Block:  Adductor canal block  Pre-Anesthetic Checklist: ,, timeout performed, Correct Patient, Correct Site, Correct Laterality, Correct Procedure, Correct Position, site marked, Risks and benefits discussed,  Surgical consent,  Pre-op evaluation,  At surgeon's request and post-op pain management  Laterality: Lower and Right  Prep: chloraprep       Needles:  Injection technique: Single-shot  Needle Type: Echogenic Stimulator Needle          Additional Needles:  Procedures: ultrasound guided (picture in chart) Adductor canal block Narrative:  Injection made incrementally with aspirations every 5 mL.  Performed by: Personally  Anesthesiologist: Joas Motton  Additional Notes: H+P and labs reviewed, risks and benefits discussed with patient, procedure tolerated well without complications   Anesthesia Regional Block:  Popliteal block  Pre-Anesthetic Checklist: ,, timeout performed, Correct Patient, Correct Site, Correct Laterality, Correct Procedure, Correct Position, site marked, Risks and benefits discussed,  Surgical consent,  Pre-op evaluation,  At surgeon's request and post-op pain management  Laterality: Lower and Right  Prep: chloraprep        Needles:  Injection technique: Single-shot  Needle Type: Echogenic Needle          Additional Needles:  Procedures: ultrasound guided (picture in chart) Popliteal block Narrative:  Injection made incrementally with aspirations every 5 mL.  Performed by: Personally   Additional Notes: H+P and labs reviewed, risks and benefits discussed with patient, procedure tolerated well without complications

## 2015-04-20 NOTE — Discharge Summary (Signed)
Physician Discharge Summary  Patient ID: CAYDYN SPRUNG MRN: [id number] DOB/AGE: [id number] y.o.  Admit date: 04/20/2015 Discharge date: 04/20/2015  Admission Diagnoses: Osteoarthritis right ankle  Discharge Diagnoses: Right total ankle arthroplasty Active Problems:   H/O total ankle replacement   Discharged Condition: stable  Hospital Course: Patient's hospital course was essentially unremarkable. He underwent total ankle arthroplasty. Postoperatively patient progressed well and was discharged to home in stable condition.  Consults: None  Significant Diagnostic Studies: labs: Routine labs  Treatments: surgery: See operative note  Discharge Exam: Blood pressure 127/78, pulse 80, temperature 97.2 F (36.2 C), temperature source Oral, resp. rate 16, height '6\' 8"'$  (2.032 m), weight 122.471 kg (270 lb), SpO2 100 %. Incision/Wound: dressing clean and dry  Disposition: 01-Home or Self Care     Medication List    ASK your doctor about these medications        albuterol 108 (90 Base) MCG/ACT inhaler  Commonly known as:  PROVENTIL HFA;VENTOLIN HFA  Inhale 1 puff into the lungs every 6 (six) hours as needed for wheezing or shortness of breath.     atenolol-chlorthalidone 50-25 MG tablet  Commonly known as:  TENORETIC  Take 1 tablet by mouth at bedtime.     COLON CLEANSE PO  Take 1 capsule by mouth daily.     DUONEB 0.5-2.5 (3) MG/3ML Soln  Generic drug:  ipratropium-albuterol  Take 3 mLs by nebulization 3 (three) times daily as needed (wheezing).     esomeprazole 40 MG capsule  Commonly known as:  NEXIUM  Take 40 mg by mouth daily.     furosemide 40 MG tablet  Commonly known as:  LASIX  Take 40 mg by mouth daily as needed for fluid.     gabapentin 400 MG capsule  Commonly known as:  NEURONTIN  Take 400 mg by mouth 2 (two) times daily.     meclizine 50 MG tablet  Commonly known as:  ANTIVERT  Take 50 mg by mouth 2 (two) times daily as needed for dizziness.     metFORMIN 500 MG tablet  Commonly known as:  GLUCOPHAGE  Take 250 mg by mouth daily with breakfast.     metoCLOPramide 5 MG tablet  Commonly known as:  REGLAN  Take 5 mg by mouth 4 (four) times daily.     mupirocin cream 2 %  Commonly known as:  BACTROBAN  Apply 1 application topically daily as needed (Rash).     nicotine polacrilex 4 MG gum  Commonly known as:  NICORETTE  Take 4 mg by mouth as needed for smoking cessation.     nitroGLYCERIN 0.4 MG SL tablet  Commonly known as:  NITROSTAT  Place 0.4 mg under the tongue every 5 (five) minutes as needed for chest pain.     Oxycodone HCl 10 MG Tabs  Take 10 mg by mouth 3 (three) times daily as needed.     potassium chloride 10 MEQ tablet  Commonly known as:  K-DUR  Take 10 mEq by mouth daily.     predniSONE 10 MG tablet  Commonly known as:  DELTASONE  Take 20 mg by mouth daily as needed. For gout per patient     PROBIOTIC DAILY PO  Take 1 capsule by mouth daily as needed.     sennosides-docusate sodium 8.6-50 MG tablet  Commonly known as:  SENOKOT-S  Take 1 tablet by mouth every other day.     sildenafil 100 MG tablet  Commonly known as:  VIAGRA  Take 100 mg by mouth daily as needed for erectile dysfunction. One tablet 30 minutes before intercourse, maximum twice weekly     tamsulosin 0.4 MG Caps capsule  Commonly known as:  FLOMAX  Take 0.4 mg by mouth daily as needed. For bladder per patient     tiotropium 18 MCG inhalation capsule  Commonly known as:  SPIRIVA  Place 18 mcg into inhaler and inhale daily as needed (Shortness of breath).           Follow-up Information    Follow up with DUDA,MARCUS V, MD In 1 week.   Specialty:  Orthopedic Surgery   Contact information:   [place] [address] [phone]       Signed: Newt Minion 04/20/2015, 7:08 PM

## 2015-04-20 NOTE — Anesthesia Preprocedure Evaluation (Signed)
Anesthesia Evaluation  Patient identified by MRN, date of birth, ID band Patient awake    Reviewed: Allergy & Precautions, NPO status , Patient's Chart, lab work & pertinent test results  History of Anesthesia Complications Negative for: history of anesthetic complications  Airway Mallampati: III  TM Distance: >3 FB Neck ROM: Full    Dental  (+) Poor Dentition, Missing   Pulmonary shortness of breath and with exertion, COPD,  COPD inhaler, Current Smoker,    breath sounds clear to auscultation       Cardiovascular hypertension, Pt. on medications (-) angina+ Peripheral Vascular Disease  (-) Past MI and (-) CHF  Rhythm:Regular     Neuro/Psych PSYCHIATRIC DISORDERS Anxiety Depression  Neuromuscular disease    GI/Hepatic hiatal hernia, GERD  Medicated and Controlled,  Endo/Other  diabetes  Renal/GU Renal InsufficiencyRenal disease     Musculoskeletal  (+) Arthritis ,   Abdominal   Peds  Hematology   Anesthesia Other Findings   Reproductive/Obstetrics                             Anesthesia Physical Anesthesia Plan  ASA: III  Anesthesia Plan: General and Regional   Post-op Pain Management:    Induction: Intravenous  Airway Management Planned: LMA and Oral ETT  Additional Equipment: None  Intra-op Plan:   Post-operative Plan: Extubation in OR  Informed Consent: I have reviewed the patients History and Physical, chart, labs and discussed the procedure including the risks, benefits and alternatives for the proposed anesthesia with the patient or authorized representative who has indicated his/her understanding and acceptance.   Dental advisory given  Plan Discussed with: CRNA and Surgeon  Anesthesia Plan Comments:         Anesthesia Quick Evaluation

## 2015-04-20 NOTE — Op Note (Signed)
04/20/2015  11:27 AM  PATIENT:  Jonathan [name]    PRE-OPERATIVE DIAGNOSIS:  Osteoarthritis Right Ankle  POST-OPERATIVE DIAGNOSIS:  Same  PROCEDURE:  TOTAL ANKLE ARTHOPLASTY  Open reduction internal fixation fibular osteotomy  SURGEON:  Newt Minion, MD  PHYSICIAN ASSISTANT:None ANESTHESIA:   General  PREOPERATIVE INDICATIONS:  [name] is a  75 y.o. male with a diagnosis of Osteoarthritis Right Ankle who failed conservative measures and elected for surgical management.    The risks benefits and alternatives were discussed with the patient preoperatively including but not limited to the risks of infection, bleeding, nerve injury, cardiopulmonary complications, the need for revision surgery, among others, and the patient was willing to proceed.  OPERATIVE IMPLANTS: Zimmer implants size 6 with a +0 polyethylene  OPERATIVE FINDINGS: Hard sclerotic bone   OPERATIVE PROCEDURE: Patient was brought to the operating room and underwent a general anesthetic after a femoral and popliteal block. After adequate levels anesthesia obtained patient's right lower extremity was prepped using DuraPrep draped into a sterile field. A timeout was called. A lateral incision was made over the fibula and Weber B fibular fracture was made with the distal aspect 1 cm proximal to the joint line. The fibula was reflected distally with the calcaneofibular ligament intact. The leg was placed on the jagged and a calcaneal pin was placed to tibial pins and a Lovena Le  Were placed to align the ankle with the milling jig parallel and perpendicular. After alignment was obtained the milling or was placed in the rotation was centered on the joint and the milling was used to mill the distal tibia and talus. The bone was very sclerotic. After milling was performed the opponents were placed to put the rails in place. The alignment was checked guidewires were placed and the drill cuts were made for the rails. The Taylor  and then tibial component were impacted. C-arm floss be verified alignment in both AP and lateral planes. Patient's ankle had good range of motion and the implants had a good seating surface. The fibula was then realigned and an interfrag screw was placed followed by a lateral buttress plate with 3 screws proximally 3 screws distally. A small piece of the bone cracked at the interface of the interfrag screw this did not change the alignment. The ankle mortise was congruent. The wound was irrigated with normal saline throughout the case. The subcutaneous was closed using 0 Vicryl the skin was closed using staples. A sterile compressive dressing was applied. Patient was extubated taken to the PACU in stable condition.

## 2015-04-20 NOTE — H&P (Signed)
Jonathan [name] is an 75 y.o. male.   Chief Complaint: Osteoarthritis right ankle HPI: Patient is a 75 year old gentleman who was failed conservative care for right ankle arthritis. Patient wishes to proceed with right total ankle arthroplasty  Past Medical History  Diagnosis Date  . Nicotine addiction   . Obesity   . Dyslipidemia   . Hypertension   . Elevated WBCs September 2011    and UTI Hospitalized   . Erectile dysfunction   . Arthritis   . Polyneuropathy in other diseases classified elsewhere (HCC) 12/05/2012  . Lesion of ulnar nerve 12/05/2012    Right side  . Gait disorder   . COPD (chronic obstructive pulmonary disease) (HCC)     normally have breathing tx prior to surgery  . Pneumonia     hx  . History of kidney stones   . GERD (gastroesophageal reflux disease)   . Neuropathy (HCC)   . Vertigo   . Diabetes mellitus without complication (HCC)     bordrline no med  . Wears dentures     bottom  . Fatty liver   . Abdominal aortic aneurysm (HCC)   . Vitamin D deficiency   . Osteoarthritis   . Lumbar spinal stenosis   . Gastritis   . Hiatal hernia   . History of colon polyps   . Shortness of breath dyspnea   . Anxiety   . Depression   . Kidney stones     Past Surgical History  Procedure Laterality Date  . Bladder surgery  02/2011    For Diverticulum of bladder removed  . Colon polyp resection    . Cysto    . Cholecystectomy N/A 02/24/2014    Procedure: LAPAROSCOPIC CHOLECYSTECTOMY;  Surgeon: Abigail Miyamoto, MD;  Location: [facility];  Service: General;  Laterality: N/A;  . Colonoscopy    . Carpal tunnel release Right 07/09/2014    Procedure: RIGHT OPEN CARPAL TUNNEL RELEASE;  Surgeon: Kathryne Hitch, MD;  Location: [facility];  Service: Orthopedics;  Laterality: Right;    Family History  Problem Relation Age of Onset  . Cancer Father     Asbestos  . Colon cancer Neg Hx   . Colon polyps Neg Hx   . Kidney disease Neg Hx   .  Esophageal cancer Neg Hx   . Gallbladder disease Neg Hx   . Heart disease Neg Hx   . Diabetes Neg Hx    Social History:  reports that he has been smoking Cigarettes.  He has a 17.5 pack-year smoking history. He has never used smokeless tobacco. He reports that he does not drink alcohol or use illicit drugs.  Allergies:  Allergies  Allergen Reactions  . Lisinopril Other (See Comments)    "Not tolerated well"  . Ciprofloxacin Other (See Comments)    Doesn't work  . Levaquin [Levofloxacin] Other (See Comments)    Doesn't work  . Triamterene-Hctz Other (See Comments)    UNSPECIFIED    No prescriptions prior to admission    Results for orders placed or performed during the hospital encounter of 04/18/15 (from the past 48 hour(s))  Glucose, capillary     Status: Abnormal   Collection Time: 04/18/15 12:54 PM  Result Value Ref Range   Glucose-Capillary 121 (H) 65 - 99 mg/dL  APTT     Status: None   Collection Time: 04/18/15  1:38 PM  Result Value Ref Range   aPTT 32 24 - 37 seconds  CBC  Status: None   Collection Time: 04/18/15  1:38 PM  Result Value Ref Range   WBC 7.1 4.0 - 10.5 K/uL   RBC 4.79 4.22 - 5.81 MIL/uL   Hemoglobin 15.5 13.0 - 17.0 g/dL   HCT 45.2 39.0 - 52.0 %   MCV 94.4 78.0 - 100.0 fL   MCH 32.4 26.0 - 34.0 pg   MCHC 34.3 30.0 - 36.0 g/dL   RDW 13.7 11.5 - 15.5 %   Platelets 165 150 - 400 K/uL  Comprehensive metabolic panel     Status: Abnormal   Collection Time: 04/18/15  1:38 PM  Result Value Ref Range   Sodium 139 135 - 145 mmol/L   Potassium 3.9 3.5 - 5.1 mmol/L   Chloride 102 101 - 111 mmol/L   CO2 26 22 - 32 mmol/L   Glucose, Bld 111 (H) 65 - 99 mg/dL   BUN 7 6 - 20 mg/dL   Creatinine, Ser 1.22 0.61 - 1.24 mg/dL   Calcium 9.3 8.9 - 10.3 mg/dL   Total Protein 7.3 6.5 - 8.1 g/dL   Albumin 3.7 3.5 - 5.0 g/dL   AST 17 15 - 41 U/L   ALT 15 (L) 17 - 63 U/L   Alkaline Phosphatase 43 38 - 126 U/L   Total Bilirubin 0.7 0.3 - 1.2 mg/dL   GFR calc  non Af Amer 57 (L) >60 mL/min   GFR calc Af Amer >60 >60 mL/min    Comment: (NOTE) The eGFR has been calculated using the CKD EPI equation. This calculation has not been validated in all clinical situations. eGFR's persistently <60 mL/min signify possible Chronic Kidney Disease.    Anion gap 11 5 - 15  Protime-INR     Status: None   Collection Time: 04/18/15  1:38 PM  Result Value Ref Range   Prothrombin Time 14.3 11.6 - 15.2 seconds   INR 1.09 0.00 - 1.49  Hemoglobin A1c     Status: Abnormal   Collection Time: 04/18/15  1:38 PM  Result Value Ref Range   Hgb A1c MFr Bld 6.6 (H) 4.8 - 5.6 %    Comment: (NOTE)         Pre-diabetes: 5.7 - 6.4         Diabetes: >6.4         Glycemic control for adults with diabetes: <7.0    Mean Plasma Glucose 143 mg/dL    Comment: (NOTE) Performed At: [facility] [place], [place] [id number] Lindon Romp MD GB:[id number]   Surgical pcr screen     Status: None   Collection Time: 04/18/15  1:39 PM  Result Value Ref Range   MRSA, PCR NEGATIVE NEGATIVE   Staphylococcus aureus NEGATIVE NEGATIVE    Comment:        The Xpert SA Assay (FDA approved for NASAL specimens in patients over 66 years of age), is one component of a comprehensive surveillance program.  Test performance has been validated by [facility] for patients greater than or equal to 25 year old. It is not intended to diagnose infection nor to guide or monitor treatment.    No results found.  Review of Systems  All other systems reviewed and are negative.   There were no vitals taken for this visit. Physical Exam  Patient is a good dorsalis pedis pulse he has radiographs which show arthritis of the right ankle. Patient's skin is intact no redness no cellulitis in his foot is plantigrade. Assessment/Plan  Assessment: Right ankle arthritis.  Plan: We'll plan for right total ankle arthroplasty. Risks and benefits were discussed including infection  neurovascular injury failure of the implant need for additional surgery need for fusion. Patient states he understands and wishes to proceed at this time.  DUDA,MARCUS V 04/20/2015, 6:19 AM

## 2015-04-20 NOTE — Progress Notes (Signed)
Orthopedic Tech Progress Note Patient Details:  Jonathan [name] January 20, 1941 [id number]  Ortho Devices Type of Ortho Device: CAM walker Ortho Device/Splint Location: rle Ortho Device/Splint Interventions: Application   [name] 04/20/2015, 2:16 PM

## 2015-04-20 NOTE — Progress Notes (Signed)
Patient ID: Jonathan [name], male   DOB: 07-08-1940, 75 y.o.   MRN: [id number] Patient resting comfortably postoperatively. He has no pain no complaints. Plan for physical therapy nonweightbearing on the right he is to wear his fracture boot at all times. Discharge to home in the morning. I'll follow-up in 1 week.

## 2015-04-20 NOTE — Anesthesia Postprocedure Evaluation (Signed)
Anesthesia Post Note  Patient: DYMIR NEESON  Procedure(s) Performed: Procedure(s) (LRB): TOTAL ANKLE ARTHOPLASTY (Right)  Patient location during evaluation: PACU Anesthesia Type: General Level of consciousness: awake and alert Pain management: pain level controlled Vital Signs Assessment: post-procedure vital signs reviewed and stable Respiratory status: spontaneous breathing, nonlabored ventilation, respiratory function stable and patient connected to nasal cannula oxygen Cardiovascular status: blood pressure returned to baseline and stable Postop Assessment: no signs of nausea or vomiting Anesthetic complications: no    Last Vitals:  Filed Vitals:   04/20/15 1330 04/20/15 1346  BP:  127/78  Pulse: 66 80  Temp:  36.2 C  Resp:  16    Last Pain:  Filed Vitals:   04/20/15 1350  PainSc: 0-No pain                 Charday Capetillo A

## 2015-04-20 NOTE — Transfer of Care (Signed)
Immediate Anesthesia Transfer of Care Note  Patient: Jonathan [name]  Procedure(s) Performed: Procedure(s): TOTAL ANKLE ARTHOPLASTY (Right)  Patient Location: PACU  Anesthesia Type:GA combined with regional for post-op pain  Level of Consciousness: awake, alert , oriented and patient cooperative  Airway & Oxygen Therapy: Patient Spontanous Breathing and Patient connected to nasal cannula oxygen  Post-op Assessment: Report given to RN, Post -op Vital signs reviewed and stable and Patient moving all extremities  Post vital signs: Reviewed and stable  Last Vitals:  Filed Vitals:   04/20/15 0707  BP: 118/76  Pulse: 74  Temp: 36.4 C  Resp: 20    Complications: No apparent anesthesia complications

## 2015-04-21 ENCOUNTER — Encounter (HOSPITAL_COMMUNITY): Payer: Self-pay | Admitting: Orthopedic Surgery

## 2015-04-21 DIAGNOSIS — M19071 Primary osteoarthritis, right ankle and foot: Secondary | ICD-10-CM | POA: Diagnosis not present

## 2015-04-21 LAB — GLUCOSE, CAPILLARY
Glucose-Capillary: 129 mg/dL — ABNORMAL HIGH (ref 65–99)
Glucose-Capillary: 86 mg/dL (ref 65–99)
Glucose-Capillary: 93 mg/dL (ref 65–99)
Glucose-Capillary: 127 mg/dL — ABNORMAL HIGH (ref 65–99)

## 2015-04-21 NOTE — Care Management Note (Signed)
Case Management Note  Patient Details  Name: Jonathan [name] MRN: [id number] Date of Birth: 1940-10-23  Subjective/Objective:     75 yr old male s/p right total ankle arthroplasty.               Action/Plan: Patient was preoperatively setup with [facility], no changes. He has family support at discharge. States he has a motorized chair, and has ordered a knee scooter which will be delivered by May 02, 2015. Patient has declined a wheelchair.  Expected Discharge Date:    04/21/15              Expected Discharge Plan:  [place]  In-House Referral:     Discharge planning Services  CM Consult  Post Acute Care Choice:  Durable Medical Equipment, Home Health Choice offered to:     DME Arranged:  N/A DME Agency:     HH Arranged:  PT HH Agency:  [facility]  Status of Service:  Completed, signed off  Medicare Important Message Given:    Date Medicare IM Given:    Medicare IM give by:    Date Additional Medicare IM Given:    Additional Medicare Important Message give by:     If discussed at [place] of Stay Meetings, dates discussed:    Additional Comments:  Ninfa Meeker, RN 04/21/2015, 10:35 AM

## 2015-04-21 NOTE — Anesthesia Postprocedure Evaluation (Signed)
Anesthesia Post Note  Patient: Jonathan [name]  Procedure(s) Performed: Procedure(s) (LRB): TOTAL ANKLE ARTHOPLASTY (Right)  Patient location during evaluation: PACU Anesthesia Type: General and Regional Level of consciousness: awake Pain management: pain level controlled Vital Signs Assessment: post-procedure vital signs reviewed and stable Respiratory status: spontaneous breathing Cardiovascular status: stable Postop Assessment: no signs of nausea or vomiting Anesthetic complications: no    Last Vitals:  Filed Vitals:   04/21/15 0135 04/21/15 0555  BP: 95/50 109/62  Pulse: 95 90  Temp: 36.6 C 36.6 C  Resp: 18 17    Last Pain:  Filed Vitals:   04/21/15 0945  PainSc: 0-No pain                 Mieka Leaton

## 2015-04-21 NOTE — Progress Notes (Signed)
While sitting up in chair pt became dizzy diaphoretic bp 113/66 reclined back in chair gave gingerale,crackers felt better.

## 2015-04-21 NOTE — Evaluation (Signed)
Physical Therapy Evaluation Patient Details Name: Jonathan [name] MRN: [id number] DOB: December 01, 1940 Today's Date: 04/21/2015   History of Present Illness  75 yo admitted for right ankle replacement. Pt PMHx: HTN, gout, arthritis, stenosis  Clinical Impression  Pt pleasant with decreased safety awareness and impulsivity throughout eval. Pt reports scooter and other equipment at home but had difficulty rationalizing use and understanding safety with DME recommendations. Pt not safe to ambulate with RW without assist and cues, improved stability with knee walker but continues to require constant cues and supervision. Pt will benefit from [facility] for home use to maintain NWB status, prevent falls and maintain safety. Pt with decreased balance and activity tolerance who will benefit from acute therapy to maximize mobility, function and gait.     Follow Up Recommendations Home health PT;Supervision for mobility/OOB    Equipment Recommendations  Wheelchair (measurements PT);Wheelchair cushion (measurements PT)    Recommendations for Other Services       Precautions / Restrictions Precautions Precautions: Fall Restrictions Weight Bearing Restrictions: Yes RLE Weight Bearing: Non weight bearing      Mobility  Bed Mobility Overal bed mobility: Modified Independent                Transfers Overall transfer level: Needs assistance   Transfers: Sit to/from Stand Sit to Stand: Min assist         General transfer comment: cues for hand placement, safety and sequence with increased cues for decreased impulsiveness from bed and to recliner  Ambulation/Gait Ambulation/Gait assistance: Min assist;+2 safety/equipment Ambulation Distance (Feet): 100 Feet Assistive device: Rolling walker (2 wheeled) (knee walker) Gait Pattern/deviations: Step-to pattern;Trunk flexed;Narrow base of support   Gait velocity interpretation: Below normal speed for age/gender General Gait Details: pt walked  10' with RW with min assist to maintain balance and max cues to step into RW and maintain safe RW use. Pt then transitioned to knee walker and was able to ambulate 100' but continued to require mod cues and guarding for safety and stability  Stairs            Wheelchair Mobility    Modified Rankin (Stroke Patients Only)       Balance Overall balance assessment: Needs assistance   Sitting balance-Leahy Scale: Good       Standing balance-Leahy Scale: Poor                               Pertinent Vitals/Pain Pain Assessment: 0-10 Pain Score: 3  Pain Location: RLE Pain Descriptors / Indicators: Aching Pain Intervention(s): Limited activity within patient's tolerance;Monitored during session;Premedicated before session;Repositioned    Home Living Family/patient expects to be discharged to:: Private residence Living Arrangements: Alone Available Help at Discharge: Friend(s);Available PRN/intermittently Type of Home: House Home Access: Ramped entrance     Home Layout: One level Home Equipment: Walker - 2 wheels;Bedside commode;Electric scooter;Wheelchair - power Additional Comments: Pt reports he ordered a knee walker online but has not received it yet and that the scooter will not fit in his house    Prior Function Level of Independence: Independent               Hand Dominance        Extremity/Trunk Assessment   Upper Extremity Assessment: Overall WFL for tasks assessed           Lower Extremity Assessment: RLE deficits/detail RLE Deficits / Details: decreased ROM due to CAM and limited  function due to NWB    Cervical / Trunk Assessment: Normal  Communication   Communication: No difficulties  Cognition Arousal/Alertness: Awake/alert Behavior During Therapy: Impulsive Overall Cognitive Status: No family/caregiver present to determine baseline cognitive functioning                      General Comments      Exercises         Assessment/Plan    PT Assessment Patient needs continued PT services  PT Diagnosis Difficulty walking;Acute pain   PT Problem List Decreased strength;Decreased activity tolerance;Decreased balance;Decreased mobility;Decreased safety awareness;Decreased knowledge of use of DME  PT Treatment Interventions DME instruction;Gait training;Functional mobility training;Therapeutic activities;Therapeutic exercise;Balance training;Patient/family education   PT Goals (Current goals can be found in the Care Plan section) Acute Rehab PT Goals Patient Stated Goal: return home PT Goal Formulation: With patient Time For Goal Achievement: 04/28/15 Potential to Achieve Goals: Fair    Frequency Min 5X/week   Barriers to discharge Decreased caregiver support      Co-evaluation               End of Session Equipment Utilized During Treatment: Gait belt Activity Tolerance: Patient tolerated treatment well Patient left: in chair;with call bell/phone within reach Nurse Communication: Mobility status;Precautions;Weight bearing status    Functional Assessment Tool Used: clinical judgement Functional Limitation: Mobility: Walking and moving around Mobility: Walking and Moving Around Current Status [phone]): At least 20 percent but less than 40 percent impaired, limited or restricted Mobility: Walking and Moving Around Goal Status [phone]): At least 1 percent but less than 20 percent impaired, limited or restricted    Time: 0830-0851 PT Time Calculation (min) (ACUTE ONLY): 21 min   Charges:   PT Evaluation $PT Eval Moderate Complexity: 1 Procedure     PT G Codes:   PT G-Codes **NOT FOR INPATIENT CLASS** Functional Assessment Tool Used: clinical judgement Functional Limitation: Mobility: Walking and moving around Mobility: Walking and Moving Around Current Status ([id number]): At least 20 percent but less than 40 percent impaired, limited or restricted Mobility: Walking and Moving Around Goal  Status [phone]): At least 1 percent but less than 20 percent impaired, limited or restricted    Melford Aase 04/21/2015, 9:35 AM  Elwyn Reach, [place]

## 2015-04-22 DIAGNOSIS — I10 Essential (primary) hypertension: Secondary | ICD-10-CM | POA: Diagnosis not present

## 2015-04-22 DIAGNOSIS — G629 Polyneuropathy, unspecified: Secondary | ICD-10-CM | POA: Diagnosis not present

## 2015-04-22 DIAGNOSIS — J449 Chronic obstructive pulmonary disease, unspecified: Secondary | ICD-10-CM | POA: Diagnosis not present

## 2015-04-22 DIAGNOSIS — Z471 Aftercare following joint replacement surgery: Secondary | ICD-10-CM | POA: Diagnosis not present

## 2015-04-22 DIAGNOSIS — M109 Gout, unspecified: Secondary | ICD-10-CM | POA: Diagnosis not present

## 2015-04-22 DIAGNOSIS — M1990 Unspecified osteoarthritis, unspecified site: Secondary | ICD-10-CM | POA: Diagnosis not present

## 2015-04-23 DIAGNOSIS — J449 Chronic obstructive pulmonary disease, unspecified: Secondary | ICD-10-CM | POA: Diagnosis not present

## 2015-04-23 DIAGNOSIS — M1990 Unspecified osteoarthritis, unspecified site: Secondary | ICD-10-CM | POA: Diagnosis not present

## 2015-04-23 DIAGNOSIS — I10 Essential (primary) hypertension: Secondary | ICD-10-CM | POA: Diagnosis not present

## 2015-04-23 DIAGNOSIS — M109 Gout, unspecified: Secondary | ICD-10-CM | POA: Diagnosis not present

## 2015-04-23 DIAGNOSIS — G629 Polyneuropathy, unspecified: Secondary | ICD-10-CM | POA: Diagnosis not present

## 2015-04-23 DIAGNOSIS — Z471 Aftercare following joint replacement surgery: Secondary | ICD-10-CM | POA: Diagnosis not present

## 2015-04-25 ENCOUNTER — Encounter (HOSPITAL_COMMUNITY): Payer: Self-pay | Admitting: Orthopedic Surgery

## 2015-04-25 DIAGNOSIS — K5900 Constipation, unspecified: Secondary | ICD-10-CM | POA: Diagnosis not present

## 2015-04-25 DIAGNOSIS — M1990 Unspecified osteoarthritis, unspecified site: Secondary | ICD-10-CM | POA: Diagnosis not present

## 2015-04-25 DIAGNOSIS — I10 Essential (primary) hypertension: Secondary | ICD-10-CM | POA: Diagnosis not present

## 2015-04-25 DIAGNOSIS — J449 Chronic obstructive pulmonary disease, unspecified: Secondary | ICD-10-CM | POA: Diagnosis not present

## 2015-04-25 DIAGNOSIS — Z79899 Other long term (current) drug therapy: Secondary | ICD-10-CM | POA: Diagnosis not present

## 2015-04-25 DIAGNOSIS — M109 Gout, unspecified: Secondary | ICD-10-CM | POA: Diagnosis not present

## 2015-04-25 DIAGNOSIS — K5903 Drug induced constipation: Secondary | ICD-10-CM | POA: Diagnosis not present

## 2015-04-25 DIAGNOSIS — Z8249 Family history of ischemic heart disease and other diseases of the circulatory system: Secondary | ICD-10-CM | POA: Diagnosis not present

## 2015-04-25 DIAGNOSIS — E119 Type 2 diabetes mellitus without complications: Secondary | ICD-10-CM | POA: Diagnosis not present

## 2015-04-25 DIAGNOSIS — Z7984 Long term (current) use of oral hypoglycemic drugs: Secondary | ICD-10-CM | POA: Diagnosis not present

## 2015-04-25 DIAGNOSIS — T8189XA Other complications of procedures, not elsewhere classified, initial encounter: Secondary | ICD-10-CM | POA: Diagnosis not present

## 2015-04-25 DIAGNOSIS — M25571 Pain in right ankle and joints of right foot: Secondary | ICD-10-CM | POA: Diagnosis not present

## 2015-04-25 DIAGNOSIS — R279 Unspecified lack of coordination: Secondary | ICD-10-CM | POA: Diagnosis not present

## 2015-04-25 DIAGNOSIS — F329 Major depressive disorder, single episode, unspecified: Secondary | ICD-10-CM | POA: Diagnosis not present

## 2015-04-25 DIAGNOSIS — Z743 Need for continuous supervision: Secondary | ICD-10-CM | POA: Diagnosis not present

## 2015-04-25 DIAGNOSIS — K219 Gastro-esophageal reflux disease without esophagitis: Secondary | ICD-10-CM | POA: Diagnosis not present

## 2015-04-25 DIAGNOSIS — Z471 Aftercare following joint replacement surgery: Secondary | ICD-10-CM | POA: Diagnosis not present

## 2015-04-25 DIAGNOSIS — G629 Polyneuropathy, unspecified: Secondary | ICD-10-CM | POA: Diagnosis not present

## 2015-04-25 DIAGNOSIS — Z87891 Personal history of nicotine dependence: Secondary | ICD-10-CM | POA: Diagnosis not present

## 2015-04-28 DIAGNOSIS — M19271 Secondary osteoarthritis, right ankle and foot: Secondary | ICD-10-CM | POA: Diagnosis not present

## 2015-04-29 ENCOUNTER — Ambulatory Visit: Payer: Medicare Other | Admitting: Gastroenterology

## 2015-04-29 DIAGNOSIS — J449 Chronic obstructive pulmonary disease, unspecified: Secondary | ICD-10-CM | POA: Diagnosis not present

## 2015-04-29 DIAGNOSIS — I10 Essential (primary) hypertension: Secondary | ICD-10-CM | POA: Diagnosis not present

## 2015-04-29 DIAGNOSIS — M1990 Unspecified osteoarthritis, unspecified site: Secondary | ICD-10-CM | POA: Diagnosis not present

## 2015-04-29 DIAGNOSIS — Z471 Aftercare following joint replacement surgery: Secondary | ICD-10-CM | POA: Diagnosis not present

## 2015-04-29 DIAGNOSIS — G629 Polyneuropathy, unspecified: Secondary | ICD-10-CM | POA: Diagnosis not present

## 2015-04-29 DIAGNOSIS — M109 Gout, unspecified: Secondary | ICD-10-CM | POA: Diagnosis not present

## 2015-05-04 DIAGNOSIS — G629 Polyneuropathy, unspecified: Secondary | ICD-10-CM | POA: Diagnosis not present

## 2015-05-04 DIAGNOSIS — E119 Type 2 diabetes mellitus without complications: Secondary | ICD-10-CM | POA: Diagnosis not present

## 2015-05-04 DIAGNOSIS — J449 Chronic obstructive pulmonary disease, unspecified: Secondary | ICD-10-CM | POA: Diagnosis not present

## 2015-05-04 DIAGNOSIS — M1990 Unspecified osteoarthritis, unspecified site: Secondary | ICD-10-CM | POA: Diagnosis not present

## 2015-05-04 DIAGNOSIS — Z79899 Other long term (current) drug therapy: Secondary | ICD-10-CM | POA: Diagnosis not present

## 2015-05-04 DIAGNOSIS — R112 Nausea with vomiting, unspecified: Secondary | ICD-10-CM | POA: Diagnosis not present

## 2015-05-04 DIAGNOSIS — K5900 Constipation, unspecified: Secondary | ICD-10-CM | POA: Diagnosis not present

## 2015-05-04 DIAGNOSIS — Z7984 Long term (current) use of oral hypoglycemic drugs: Secondary | ICD-10-CM | POA: Diagnosis not present

## 2015-05-04 DIAGNOSIS — Z471 Aftercare following joint replacement surgery: Secondary | ICD-10-CM | POA: Diagnosis not present

## 2015-05-04 DIAGNOSIS — M109 Gout, unspecified: Secondary | ICD-10-CM | POA: Diagnosis not present

## 2015-05-04 DIAGNOSIS — K5669 Other intestinal obstruction: Secondary | ICD-10-CM | POA: Diagnosis not present

## 2015-05-04 DIAGNOSIS — R918 Other nonspecific abnormal finding of lung field: Secondary | ICD-10-CM | POA: Diagnosis not present

## 2015-05-04 DIAGNOSIS — Z96661 Presence of right artificial ankle joint: Secondary | ICD-10-CM | POA: Diagnosis not present

## 2015-05-04 DIAGNOSIS — R1084 Generalized abdominal pain: Secondary | ICD-10-CM | POA: Diagnosis not present

## 2015-05-04 DIAGNOSIS — K219 Gastro-esophageal reflux disease without esophagitis: Secondary | ICD-10-CM | POA: Diagnosis not present

## 2015-05-04 DIAGNOSIS — I10 Essential (primary) hypertension: Secondary | ICD-10-CM | POA: Diagnosis not present

## 2015-05-04 DIAGNOSIS — Z87891 Personal history of nicotine dependence: Secondary | ICD-10-CM | POA: Diagnosis not present

## 2015-05-06 ENCOUNTER — Encounter: Payer: Self-pay | Admitting: Gastroenterology

## 2015-05-06 ENCOUNTER — Other Ambulatory Visit (HOSPITAL_COMMUNITY): Payer: Self-pay | Admitting: Family Medicine

## 2015-05-06 DIAGNOSIS — J984 Other disorders of lung: Secondary | ICD-10-CM

## 2015-05-06 DIAGNOSIS — R918 Other nonspecific abnormal finding of lung field: Secondary | ICD-10-CM

## 2015-05-09 ENCOUNTER — Other Ambulatory Visit: Payer: Self-pay | Admitting: Internal Medicine

## 2015-05-09 DIAGNOSIS — K5903 Drug induced constipation: Secondary | ICD-10-CM | POA: Diagnosis not present

## 2015-05-09 DIAGNOSIS — R918 Other nonspecific abnormal finding of lung field: Secondary | ICD-10-CM | POA: Diagnosis not present

## 2015-05-10 DIAGNOSIS — J449 Chronic obstructive pulmonary disease, unspecified: Secondary | ICD-10-CM | POA: Diagnosis not present

## 2015-05-10 DIAGNOSIS — Z471 Aftercare following joint replacement surgery: Secondary | ICD-10-CM | POA: Diagnosis not present

## 2015-05-10 DIAGNOSIS — G629 Polyneuropathy, unspecified: Secondary | ICD-10-CM | POA: Diagnosis not present

## 2015-05-10 DIAGNOSIS — I10 Essential (primary) hypertension: Secondary | ICD-10-CM | POA: Diagnosis not present

## 2015-05-10 DIAGNOSIS — M109 Gout, unspecified: Secondary | ICD-10-CM | POA: Diagnosis not present

## 2015-05-10 DIAGNOSIS — M1990 Unspecified osteoarthritis, unspecified site: Secondary | ICD-10-CM | POA: Diagnosis not present

## 2015-05-11 DIAGNOSIS — I10 Essential (primary) hypertension: Secondary | ICD-10-CM | POA: Diagnosis not present

## 2015-05-11 DIAGNOSIS — E119 Type 2 diabetes mellitus without complications: Secondary | ICD-10-CM | POA: Diagnosis not present

## 2015-05-11 DIAGNOSIS — R42 Dizziness and giddiness: Secondary | ICD-10-CM | POA: Diagnosis not present

## 2015-05-16 ENCOUNTER — Encounter (HOSPITAL_COMMUNITY)
Admission: RE | Admit: 2015-05-16 | Discharge: 2015-05-16 | Disposition: A | Discharge status: Home / Self Care | Payer: Medicare Other | Source: Ambulatory Visit | Attending: Family Medicine | Admitting: Family Medicine

## 2015-05-16 DIAGNOSIS — R918 Other nonspecific abnormal finding of lung field: Secondary | ICD-10-CM | POA: Insufficient documentation

## 2015-05-16 DIAGNOSIS — J984 Other disorders of lung: Secondary | ICD-10-CM | POA: Insufficient documentation

## 2015-05-16 LAB — GLUCOSE, CAPILLARY: Glucose-Capillary: 90 mg/dL (ref 65–99)

## 2015-05-16 MED ORDER — FLUDEOXYGLUCOSE F - 18 (FDG) INJECTION
13.8000 | Freq: Once | INTRAVENOUS | Status: AC | PRN
  Administered 2015-05-16: 13.8 via INTRAVENOUS

## 2015-05-18 DIAGNOSIS — Z471 Aftercare following joint replacement surgery: Secondary | ICD-10-CM | POA: Diagnosis not present

## 2015-05-18 DIAGNOSIS — M1990 Unspecified osteoarthritis, unspecified site: Secondary | ICD-10-CM | POA: Diagnosis not present

## 2015-05-18 DIAGNOSIS — G629 Polyneuropathy, unspecified: Secondary | ICD-10-CM | POA: Diagnosis not present

## 2015-05-18 DIAGNOSIS — M109 Gout, unspecified: Secondary | ICD-10-CM | POA: Diagnosis not present

## 2015-05-18 DIAGNOSIS — J449 Chronic obstructive pulmonary disease, unspecified: Secondary | ICD-10-CM | POA: Diagnosis not present

## 2015-05-18 DIAGNOSIS — I10 Essential (primary) hypertension: Secondary | ICD-10-CM | POA: Diagnosis not present

## 2015-05-19 DIAGNOSIS — D491 Neoplasm of unspecified behavior of respiratory system: Secondary | ICD-10-CM | POA: Diagnosis not present

## 2015-05-25 ENCOUNTER — Other Ambulatory Visit: Payer: Self-pay | Admitting: Thoracic Surgery (Cardiothoracic Vascular Surgery)

## 2015-05-25 DIAGNOSIS — R918 Other nonspecific abnormal finding of lung field: Secondary | ICD-10-CM

## 2015-05-25 DIAGNOSIS — M109 Gout, unspecified: Secondary | ICD-10-CM | POA: Diagnosis not present

## 2015-05-25 DIAGNOSIS — Z87891 Personal history of nicotine dependence: Secondary | ICD-10-CM | POA: Diagnosis not present

## 2015-05-25 DIAGNOSIS — M1990 Unspecified osteoarthritis, unspecified site: Secondary | ICD-10-CM | POA: Diagnosis not present

## 2015-05-25 DIAGNOSIS — I10 Essential (primary) hypertension: Secondary | ICD-10-CM | POA: Diagnosis not present

## 2015-05-25 DIAGNOSIS — J449 Chronic obstructive pulmonary disease, unspecified: Secondary | ICD-10-CM | POA: Diagnosis not present

## 2015-05-25 DIAGNOSIS — Z471 Aftercare following joint replacement surgery: Secondary | ICD-10-CM | POA: Diagnosis not present

## 2015-05-25 DIAGNOSIS — J432 Centrilobular emphysema: Secondary | ICD-10-CM | POA: Diagnosis not present

## 2015-05-25 DIAGNOSIS — G629 Polyneuropathy, unspecified: Secondary | ICD-10-CM | POA: Diagnosis not present

## 2015-05-26 ENCOUNTER — Other Ambulatory Visit: Payer: Self-pay | Admitting: Thoracic Surgery (Cardiothoracic Vascular Surgery)

## 2015-05-26 DIAGNOSIS — R918 Other nonspecific abnormal finding of lung field: Secondary | ICD-10-CM

## 2015-05-31 ENCOUNTER — Encounter: Payer: Self-pay | Admitting: *Deleted

## 2015-05-31 DIAGNOSIS — I10 Essential (primary) hypertension: Secondary | ICD-10-CM | POA: Diagnosis not present

## 2015-05-31 DIAGNOSIS — D381 Neoplasm of uncertain behavior of trachea, bronchus and lung: Secondary | ICD-10-CM

## 2015-05-31 DIAGNOSIS — G629 Polyneuropathy, unspecified: Secondary | ICD-10-CM | POA: Diagnosis not present

## 2015-05-31 DIAGNOSIS — M1990 Unspecified osteoarthritis, unspecified site: Secondary | ICD-10-CM | POA: Diagnosis not present

## 2015-05-31 DIAGNOSIS — N200 Calculus of kidney: Secondary | ICD-10-CM | POA: Insufficient documentation

## 2015-05-31 DIAGNOSIS — J449 Chronic obstructive pulmonary disease, unspecified: Secondary | ICD-10-CM | POA: Diagnosis not present

## 2015-05-31 DIAGNOSIS — M810 Age-related osteoporosis without current pathological fracture: Secondary | ICD-10-CM | POA: Insufficient documentation

## 2015-05-31 DIAGNOSIS — Z471 Aftercare following joint replacement surgery: Secondary | ICD-10-CM | POA: Diagnosis not present

## 2015-05-31 DIAGNOSIS — M109 Gout, unspecified: Secondary | ICD-10-CM | POA: Diagnosis not present

## 2015-05-31 DIAGNOSIS — J439 Emphysema, unspecified: Secondary | ICD-10-CM | POA: Insufficient documentation

## 2015-06-01 ENCOUNTER — Other Ambulatory Visit: Payer: Self-pay | Admitting: *Deleted

## 2015-06-01 ENCOUNTER — Institutional Professional Consult (permissible substitution) (INDEPENDENT_AMBULATORY_CARE_PROVIDER_SITE_OTHER): Payer: Medicare Other | Admitting: Thoracic Surgery (Cardiothoracic Vascular Surgery)

## 2015-06-01 ENCOUNTER — Ambulatory Visit
Admission: RE | Admit: 2015-06-01 | Discharge: 2015-06-01 | Disposition: A | Discharge status: Home / Self Care | Payer: Medicare Other | Source: Ambulatory Visit | Attending: Thoracic Surgery (Cardiothoracic Vascular Surgery) | Admitting: Thoracic Surgery (Cardiothoracic Vascular Surgery)

## 2015-06-01 ENCOUNTER — Encounter: Payer: Self-pay | Admitting: Thoracic Surgery (Cardiothoracic Vascular Surgery)

## 2015-06-01 ENCOUNTER — Ambulatory Visit (HOSPITAL_COMMUNITY)
Admission: RE | Admit: 2015-06-01 | Discharge: 2015-06-01 | Disposition: A | Discharge status: Home / Self Care | Payer: Medicare Other | Source: Ambulatory Visit | Attending: Thoracic Surgery (Cardiothoracic Vascular Surgery) | Admitting: Thoracic Surgery (Cardiothoracic Vascular Surgery)

## 2015-06-01 VITALS — BP 140/84 | HR 76 | Resp 20 | Ht >= 80 in | Wt 260.0 lb

## 2015-06-01 DIAGNOSIS — R918 Other nonspecific abnormal finding of lung field: Secondary | ICD-10-CM

## 2015-06-01 DIAGNOSIS — J449 Chronic obstructive pulmonary disease, unspecified: Secondary | ICD-10-CM | POA: Diagnosis not present

## 2015-06-01 DIAGNOSIS — E119 Type 2 diabetes mellitus without complications: Secondary | ICD-10-CM | POA: Insufficient documentation

## 2015-06-01 LAB — PULMONARY FUNCTION TEST
DL/VA % pred: 69 %
DL/VA: 3.51 ml/min/mmHg/L
DLCO unc % pred: 53 %
DLCO unc: 24.36 ml/min/mmHg
FEF 25-75 Post: 1.9 L/sec
FEF 25-75 Pre: 1.61 L/sec
FEF2575-%Change-Post: 18 %
FEF2575-%Pred-Post: 56 %
FEF2575-%Pred-Pre: 47 %
FEV1-%Change-Post: 2 %
FEV1-%Pred-Post: 60 %
FEV1-%Pred-Pre: 59 %
FEV1-Post: 2.5 L
FEV1-Pre: 2.44 L
FEV1FVC-%Change-Post: -8 %
FEV1FVC-%Pred-Pre: 86 %
FEV6-%Change-Post: 8 %
FEV6-%Pred-Post: 74 %
FEV6-%Pred-Pre: 68 %
FEV6-Post: 3.86 L
FEV6-Pre: 3.55 L
FEV6FVC-%Change-Post: -2 %
FEV6FVC-%Pred-Post: 97 %
FEV6FVC-%Pred-Pre: 99 %
FVC-%Change-Post: 11 %
FVC-%Pred-Post: 76 %
FVC-%Pred-Pre: 68 %
FVC-Post: 4.12 L
FVC-Pre: 3.7 L
Post FEV1/FVC ratio: 61 %
Post FEV6/FVC ratio: 94 %
Pre FEV1/FVC ratio: 66 %
Pre FEV6/FVC Ratio: 96 %
RV % pred: 119 %
RV: 3.67 L
TLC % pred: 82 %
TLC: 7.49 L

## 2015-06-01 MED ORDER — ALBUTEROL SULFATE (2.5 MG/3ML) 0.083% IN NEBU
2.5000 mg | INHALATION_SOLUTION | Freq: Once | RESPIRATORY_TRACT | Status: AC
  Administered 2015-06-01: 2.5 mg via RESPIRATORY_TRACT

## 2015-06-01 NOTE — Progress Notes (Signed)
PCP is Celedonio Savage, MD Referring Provider is Darovsky, Marko Stai, MD  Chief Complaint  Patient presents with  . Lung Lesion    Surgical eval, Super D Chest CT today,  C/A/P CT 05/04/15, MR Head 05/04/15    HPI: Jonathan [name] is sent for evaluation for bilateral pulmonary nodules.  Jonathan [name] is a 75 year old man with past medical history significant for tobacco abuse (50+ pack years), COPD, type 2 diabetes, hypertension, hyperlipidemia, gastroesophageal reflux, abdominal aortic aneurysm, neuropathy (secondary to agent orange exposure), arthritis, recent right ankle replacement, and chronic kidney disease. The details are murky but he has been followed for a right upper lobe nodule. He recently had a CT scan that showed a right upper lobe nodule had increased in size and there were branching tubular densities around it. There also was a new cavitary lesion in the left upper lobe. He saw Dr. Jacquiline Doe. A PET CT showed both lesions were markedly hypermetabolic, but there were no other worrisome findings. Specifically there was no hilar or mediastinal adenopathy.  Jonathan [name] denies any history of cardiac disease. He smoked until November 2016. He smoked about a pack a day for over 50 years. He had a right ankle replacement in January of this year and his activities have been limited since then. He is currently walking with a boot. He gets short of breath with heavy exertion but has not noticed any change in that recently. He denies any chest pain, pressure, or tightness. He's lost about 5 pounds over the past 3 months and 10 pounds over the past 6 months. He has an occasional cough, but denies wheezing or hemoptysis.  Zubrod Score: At the time of surgery this patient's most appropriate activity status/level should be described as: '[]'$     0    Normal activity, no symptoms '[x]'$     1    Restricted in physical strenuous activity but ambulatory, able to do out light work '[]'$     2    Ambulatory and capable of self  care, unable to do work activities, up and about >50 % of waking hours                              '[]'$     3    Only limited self care, in bed greater than 50% of waking hours '[]'$     4    Completely disabled, no self care, confined to bed or chair '[]'$     5    Moribund    Past Medical History  Diagnosis Date  . Nicotine addiction   . Obesity   . Dyslipidemia   . Hypertension   . Elevated WBCs September 2011    and UTI Hospitalized   . Erectile dysfunction   . Arthritis   . Polyneuropathy in other diseases classified elsewhere ([place]) 12/05/2012  . Lesion of ulnar nerve 12/05/2012    Right side  . Gait disorder   . COPD (chronic obstructive pulmonary disease) ([place])     normally have breathing tx prior to surgery  . Pneumonia     hx  . History of kidney stones   . GERD (gastroesophageal reflux disease)   . Neuropathy ([place])   . Vertigo   . Wears dentures     bottom  . Fatty liver   . Abdominal aortic aneurysm ([place])   . Vitamin D deficiency   . Osteoarthritis   . Lumbar spinal stenosis   .  Gastritis   . Hiatal hernia   . History of colon polyps   . Shortness of breath dyspnea   . Anxiety   . Depression   . Emphysema of lung ([place])   . Diabetes mellitus without complication ([place])     borderline no med, controlled with diet  . Kidney stones   . CKD (chronic kidney disease)   . Osteoporosis     Past Surgical History  Procedure Laterality Date  . Bladder surgery  02/2011    For Diverticulum of bladder removed  . Colon polyp resection    . Cysto    . Cholecystectomy N/A 02/24/2014    Procedure: LAPAROSCOPIC CHOLECYSTECTOMY;  Surgeon: Coralie Keens, MD;  Location: [facility];  Service: General;  Laterality: N/A;  . Colonoscopy    . Carpal tunnel release Right 07/09/2014    Procedure: RIGHT OPEN CARPAL TUNNEL RELEASE;  Surgeon: Mcarthur Rossetti, MD;  Location: [facility];  Service: Orthopedics;  Laterality: Right;  . Ankle arthroplasty Right 04/20/2015  .  Total ankle arthroplasty Right 04/20/2015    Procedure: TOTAL ANKLE ARTHOPLASTY;  Surgeon: Newt Minion, MD;  Location: [facility];  Service: Orthopedics;  Laterality: Right;    Family History  Problem Relation Age of Onset  . Cancer Father     Asbestos  . Colon cancer Neg Hx   . Colon polyps Neg Hx   . Kidney disease Neg Hx   . Esophageal cancer Neg Hx   . Gallbladder disease Neg Hx   . Heart disease Neg Hx   . Diabetes Neg Hx     Social History Social History  Substance Use Topics  . Smoking status: Current Every Day Smoker -- 0.50 packs/day for 35 years    Types: Cigarettes  . Smokeless tobacco: Never Used  . Alcohol Use: No    Current Outpatient Prescriptions  Medication Sig Dispense Refill  . albuterol (PROVENTIL HFA;VENTOLIN HFA) 108 (90 BASE) MCG/ACT inhaler Inhale 1 puff into the lungs every 6 (six) hours as needed for wheezing or shortness of breath.    [name] atenolol-chlorthalidone (TENORETIC) 50-25 MG per tablet Take 1 tablet by mouth at bedtime.     [name] esomeprazole (NEXIUM) 40 MG capsule Take 40 mg by mouth daily.     . furosemide (LASIX) 40 MG tablet Take 40 mg by mouth daily as needed for fluid.    [name] gabapentin (NEURONTIN) 400 MG capsule Take 400 mg by mouth 2 (two) times daily.     [name] ipratropium-albuterol (DUONEB) 0.5-2.5 (3) MG/3ML SOLN Take 3 mLs by nebulization 3 (three) times daily as needed (wheezing).     . meclizine (ANTIVERT) 50 MG tablet Take 50 mg by mouth 2 (two) times daily as needed for dizziness.     . metFORMIN (GLUCOPHAGE) 500 MG tablet Take 250 mg by mouth daily with breakfast.     . metoCLOPramide (REGLAN) 5 MG tablet Take 5 mg by mouth 4 (four) times daily.    . Misc Natural Products (COLON CLEANSE PO) Take 1 capsule by mouth daily.    . mupirocin cream (BACTROBAN) 2 % Apply 1 application topically daily as needed (Rash).    . nicotine polacrilex (NICORETTE) 4 MG gum Take 4 mg by mouth as needed for smoking cessation.    . nitroGLYCERIN (NITROSTAT) 0.4  MG SL tablet Place 0.4 mg under the tongue every 5 (five) minutes as needed for chest pain.    . Oxycodone HCl 10 MG TABS Take 10  mg by mouth 3 (three) times daily as needed.    . potassium chloride (K-DUR) 10 MEQ tablet Take 10 mEq by mouth daily.    . predniSONE (DELTASONE) 10 MG tablet Take 20 mg by mouth daily as needed. For gout per patient    . Probiotic Product (PROBIOTIC DAILY PO) Take 1 capsule by mouth daily as needed.    . sennosides-docusate sodium (SENOKOT-S) 8.6-50 MG tablet Take 1 tablet by mouth every other day.    . sildenafil (VIAGRA) 100 MG tablet Take 100 mg by mouth daily as needed for erectile dysfunction. One tablet 30 minutes before intercourse, maximum twice weekly    . tamsulosin (FLOMAX) 0.4 MG CAPS capsule Take 0.4 mg by mouth daily as needed. For bladder per patient    . tiotropium (SPIRIVA) 18 MCG inhalation capsule Place 18 mcg into inhaler and inhale daily as needed (Shortness of breath).      No current facility-administered medications for this visit.   Facility-Administered Medications Ordered in Other Visits  Medication Dose Route Frequency Provider Last Rate Last Dose  . lactated ringers infusion    Continuous PRN Kerby Less, CRNA 0 mL/hr at 05/15/13 0750      Allergies  Allergen Reactions  . Lisinopril Other (See Comments)    nervous  . Ciprofloxacin Other (See Comments)    Doesn't work  . Levaquin [Levofloxacin] Other (See Comments)    Doesn't work  . Triamterene-Hctz Other (See Comments)    agitation    Review of Systems  Constitutional: Positive for unexpected weight change (has losr 5 pounds in 3 months). Negative for fever, chills, diaphoresis and activity change.  HENT: Positive for dental problem (dentures). Negative for trouble swallowing and voice change.   Eyes: Positive for visual disturbance (blurry).  Respiratory: Positive for shortness of breath (with heavy exertion).   Cardiovascular: Positive for leg swelling. Negative  for chest pain and palpitations.  Gastrointestinal: Positive for abdominal pain (Reflux). Negative for blood in stool.  Genitourinary: Positive for frequency. Negative for difficulty urinating.  Musculoskeletal: Positive for joint swelling and arthralgias.       Recent right ankle replacement  Skin: Positive for rash.  Neurological: Positive for numbness. Negative for weakness and headaches.  Hematological: Negative for adenopathy. Does not bruise/bleed easily.  Psychiatric/Behavioral: Positive for dysphoric mood. The patient is nervous/anxious.     BP 140/84 mmHg  Pulse 76  Resp 20  Ht '6\' 8"'$  (2.032 m)  Wt 260 lb (117.935 kg)  BMI 28.56 kg/m2  SpO2 98% Physical Exam  Constitutional: He is oriented to person, place, and time. He appears well-developed and well-nourished. No distress.  HENT:  Head: Normocephalic and atraumatic.  Mouth/Throat: No oropharyngeal exudate.  Eyes: Conjunctivae and EOM are normal. No scleral icterus.  Neck: Neck supple. No thyromegaly present.  Cardiovascular: Normal rate, regular rhythm, normal heart sounds and intact distal pulses.  Exam reveals no gallop and no friction rub.   No murmur heard. Pulmonary/Chest: Effort normal and breath sounds normal. He has no wheezes. He has no rales.  Abdominal: Soft. He exhibits no distension. There is no tenderness.  Musculoskeletal: He exhibits no edema.  Lymphadenopathy:    He has no cervical adenopathy.  Neurological: He is alert and oriented to person, place, and time. No cranial nerve deficit.  No focal motor deficit, numb lateral aspect right forearm and hand  Skin: Skin is warm and dry.  Vitals reviewed.    Diagnostic Tests: CT CHEST WITHOUT CONTRAST  TECHNIQUE: Multidetector CT imaging of the chest was performed using thin slice collimation for electromagnetic bronchoscopy planning purposes, without intravenous contrast.  COMPARISON: 05/04/2015 CT and 05/16/2015  PET.  FINDINGS: Mediastinum/Lymph Nodes: Tortuous descending thoracic aorta. Aortic and branch vessel atherosclerosis. Normal heart size, without pericardial effusion. No mediastinal or definite hilar adenopathy, given limitations of unenhanced CT.  Lungs/Pleura: No pleural fluid. Mixed attenuation right upper lobe pulmonary nodule with solid component measuring 2.2 x 1.9 cm on image 25/series 4. 2.0 x 1.7 cm on the prior CT.  Cavitary left upper lobe pulmonary nodule measures 1.3 x 1.4 cm on image 25/series 4. Compare 1.3 x 1.2 cm on the prior exam (when remeasured).  Upper abdomen: Mild hepatic steatosis. Subtle irregular hepatic capsule, including on image 73/series 3. Normal imaged portions of the spleen, stomach, pancreas adrenal glands, left kidney.  Musculoskeletal: Moderate thoracic spondylosis.  IMPRESSION: 1. bilateral upper lobe pulmonary nodules, as previously. 2. No thoracic adenopathy. 3. Hepatic steatosis. Subtle irregular hepatic capsule is suspicious for cirrhosis. Correlate with risk factors.   Electronically Signed  By: Abigail Miyamoto M.D.  On: 06/01/2015 15:02  Pulmonary function testing  FVC= 3.78(68%) FEV1= 2.44(59%), post 2.50(60%) FEV1/FVC= 0.66 DLCO= 24.36 (53%)  I personally reviewed the CT and PET/CT and concur with findings as noted above. PET/CT showed both the right upper lobe and the left upper lobe nodules were markedly hypermetabolic. There were no other suspicious findings.  Impression: Mr. Nishiyama is a 75 year old man with a history of tobacco abuse who has bilateral upper lobe lung nodules. The right upper lobe nodule is more solid and has some branching tubular densities around it. The left upper lobe nodule is cystic in nature but with a thick wall. Both of these lesions were markedly hypermetabolic on PET/CT. They're both highly suspicious for synchronous primary bronchogenic carcinomas.   He currently is somewhat limited  physically due to his recent ankle surgery, but I do think he has adequate pulmonary and overall physiologic reserve to tolerate surgical resection.  I recommended to Mr. Detjen that we do a navigational bronchoscopy to sample of both lesions at the same setting. Given the appearance of the right upper lobe nodule there is an outside chance this could be infectious in nature. We will plan to do AFB and fungal cultures at the time of the procedure. He does understand that there is a possibility that the biopsies could be nondiagnostic. I described the procedure to him in detail. He was already familiar with it from reading about it. He understands that this would be done under general anesthesia on an outpatient basis. There would be no incisions, strictly an endoscopic approach. He understands risk include those associated with general anesthesia and include but are not limited to death, MI, DVT, PE, bleeding, pneumothorax, and failure to make a diagnosis, as well as the possibility of other unforeseeable complications. He accepts the risks and wishes to proceed.  Further down the road there is potential for a staged bilateral resections with a right upper lobectomy followed by a segmental resection of the left upper lobe. That of course depend on the findings at the time of bronchoscopy.  Plan:  Electromagnetic navigational bronchoscopy on Friday, 06/17/2015  I spent 45 minutes with Mr. Spillers during this visit. Greater than 50% of the time was spent in counseling. Melrose Nakayama, MD [facility] [phone]

## 2015-06-08 DIAGNOSIS — L602 Onychogryphosis: Secondary | ICD-10-CM | POA: Diagnosis not present

## 2015-06-08 DIAGNOSIS — E1351 Other specified diabetes mellitus with diabetic peripheral angiopathy without gangrene: Secondary | ICD-10-CM | POA: Diagnosis not present

## 2015-06-08 DIAGNOSIS — B353 Tinea pedis: Secondary | ICD-10-CM | POA: Diagnosis not present

## 2015-06-08 DIAGNOSIS — L84 Corns and callosities: Secondary | ICD-10-CM | POA: Diagnosis not present

## 2015-06-11 DIAGNOSIS — J449 Chronic obstructive pulmonary disease, unspecified: Secondary | ICD-10-CM | POA: Diagnosis not present

## 2015-06-11 DIAGNOSIS — M109 Gout, unspecified: Secondary | ICD-10-CM | POA: Diagnosis not present

## 2015-06-11 DIAGNOSIS — M1990 Unspecified osteoarthritis, unspecified site: Secondary | ICD-10-CM | POA: Diagnosis not present

## 2015-06-11 DIAGNOSIS — G629 Polyneuropathy, unspecified: Secondary | ICD-10-CM | POA: Diagnosis not present

## 2015-06-11 DIAGNOSIS — Z471 Aftercare following joint replacement surgery: Secondary | ICD-10-CM | POA: Diagnosis not present

## 2015-06-11 DIAGNOSIS — I10 Essential (primary) hypertension: Secondary | ICD-10-CM | POA: Diagnosis not present

## 2015-06-13 ENCOUNTER — Encounter (HOSPITAL_COMMUNITY): Payer: Self-pay

## 2015-06-13 ENCOUNTER — Other Ambulatory Visit (HOSPITAL_COMMUNITY): Payer: Self-pay | Admitting: *Deleted

## 2015-06-13 ENCOUNTER — Encounter (HOSPITAL_COMMUNITY)
Admission: RE | Admit: 2015-06-13 | Discharge: 2015-06-13 | Disposition: A | Discharge status: Home / Self Care | Payer: Medicare Other | Source: Ambulatory Visit | Attending: Thoracic Surgery (Cardiothoracic Vascular Surgery) | Admitting: Thoracic Surgery (Cardiothoracic Vascular Surgery)

## 2015-06-13 VITALS — BP 125/79 | HR 87 | Temp 97.5°F | Resp 20 | Ht >= 80 in | Wt 260.0 lb

## 2015-06-13 DIAGNOSIS — R918 Other nonspecific abnormal finding of lung field: Secondary | ICD-10-CM | POA: Insufficient documentation

## 2015-06-13 DIAGNOSIS — K219 Gastro-esophageal reflux disease without esophagitis: Secondary | ICD-10-CM | POA: Insufficient documentation

## 2015-06-13 DIAGNOSIS — Z01812 Encounter for preprocedural laboratory examination: Secondary | ICD-10-CM | POA: Insufficient documentation

## 2015-06-13 DIAGNOSIS — Z6828 Body mass index (BMI) 28.0-28.9, adult: Secondary | ICD-10-CM | POA: Insufficient documentation

## 2015-06-13 DIAGNOSIS — N189 Chronic kidney disease, unspecified: Secondary | ICD-10-CM | POA: Diagnosis not present

## 2015-06-13 DIAGNOSIS — E1142 Type 2 diabetes mellitus with diabetic polyneuropathy: Secondary | ICD-10-CM | POA: Insufficient documentation

## 2015-06-13 DIAGNOSIS — Z7984 Long term (current) use of oral hypoglycemic drugs: Secondary | ICD-10-CM | POA: Insufficient documentation

## 2015-06-13 DIAGNOSIS — E669 Obesity, unspecified: Secondary | ICD-10-CM | POA: Diagnosis not present

## 2015-06-13 DIAGNOSIS — F17210 Nicotine dependence, cigarettes, uncomplicated: Secondary | ICD-10-CM | POA: Insufficient documentation

## 2015-06-13 DIAGNOSIS — E1122 Type 2 diabetes mellitus with diabetic chronic kidney disease: Secondary | ICD-10-CM | POA: Diagnosis not present

## 2015-06-13 DIAGNOSIS — I129 Hypertensive chronic kidney disease with stage 1 through stage 4 chronic kidney disease, or unspecified chronic kidney disease: Secondary | ICD-10-CM | POA: Insufficient documentation

## 2015-06-13 DIAGNOSIS — Z79899 Other long term (current) drug therapy: Secondary | ICD-10-CM | POA: Diagnosis not present

## 2015-06-13 DIAGNOSIS — E785 Hyperlipidemia, unspecified: Secondary | ICD-10-CM | POA: Insufficient documentation

## 2015-06-13 DIAGNOSIS — J449 Chronic obstructive pulmonary disease, unspecified: Secondary | ICD-10-CM | POA: Insufficient documentation

## 2015-06-13 LAB — CBC
HCT: 40 % (ref 39.0–52.0)
Hemoglobin: 13.4 g/dL (ref 13.0–17.0)
MCH: 31.4 pg (ref 26.0–34.0)
MCHC: 33.5 g/dL (ref 30.0–36.0)
MCV: 93.7 fL (ref 78.0–100.0)
Platelets: 189 10*3/uL (ref 150–400)
RBC: 4.27 MIL/uL (ref 4.22–5.81)
RDW: 14 % (ref 11.5–15.5)
WBC: 9.4 10*3/uL (ref 4.0–10.5)

## 2015-06-13 LAB — COMPREHENSIVE METABOLIC PANEL
ALT: 14 U/L — ABNORMAL LOW (ref 17–63)
AST: 16 U/L (ref 15–41)
Albumin: 3.5 g/dL (ref 3.5–5.0)
Alkaline Phosphatase: 58 U/L (ref 38–126)
Anion gap: 10 (ref 5–15)
BUN: 9 mg/dL (ref 6–20)
CO2: 28 mmol/L (ref 22–32)
Calcium: 9.4 mg/dL (ref 8.9–10.3)
Chloride: 102 mmol/L (ref 101–111)
Creatinine, Ser: 1.27 mg/dL — ABNORMAL HIGH (ref 0.61–1.24)
GFR calc Af Amer: >60 mL/min (ref >60)
GFR calc non Af Amer: 54 mL/min — ABNORMAL LOW (ref >60)
Glucose, Bld: 98 mg/dL (ref 65–99)
Potassium: 3.5 mmol/L (ref 3.5–5.1)
Sodium: 140 mmol/L (ref 135–145)
Total Bilirubin: 0.4 mg/dL (ref 0.3–1.2)
Total Protein: 7.5 g/dL (ref 6.5–8.1)

## 2015-06-13 LAB — PROTIME-INR
INR: 1.11 (ref 0.00–1.49)
Prothrombin Time: 14.5 seconds (ref 11.6–15.2)

## 2015-06-13 LAB — APTT: aPTT: 29 seconds (ref 24–37)

## 2015-06-13 LAB — GLUCOSE, CAPILLARY: Glucose-Capillary: 86 mg/dL (ref 65–99)

## 2015-06-13 NOTE — Pre-Procedure Instructions (Addendum)
Jonathan [name]  06/13/2015      [facility] [place], [address] [place] [address] Phone: [phone] Fax: [phone]    Your procedure is scheduled on Friday, June 17, 2015   Report to [facility] Entrance "A" Admitting Office at 5:30 AM.   Call this number if you have problems the morning of surgery: [phone]   Any questions prior to day of surgery, please call [phone] between 8 & 4 PM.   Remember:  Do not eat food or drink liquids after midnight Thursday, 06/16/15.  Take these medicines the morning of surgery with A SIP OF WATER Esomeprazole (Nexium), Gabapentin (Neurontin), Metoclopramide (Reglan), Oxycodone - if needed, Tamsulosin (Flomax) - if needed, Spiriva inhaler - if needed, DuoNeb - if needed, Albuterol inhaler - if needed (Please bring this inhaler with you the morning of surgery)  No metformin day of surgery  How to Manage Your Diabetes Before Surgery   Why is it important to control my blood sugar before and after surgery?   Improving blood sugar levels before and after surgery helps healing and can limit problems.  A way of improving blood sugar control is eating a healthy diet by:  - Eating less sugar and carbohydrates  - Increasing activity/exercise  - Talk with your doctor about reaching your blood sugar goals  High blood sugars (greater than 180 mg/dL) can raise your risk of infections and slow down your recovery so you will need to focus on controlling your diabetes during the weeks before surgery.  Make sure that the doctor who takes care of your diabetes knows about your planned surgery including the date and location.  How do I manage my blood sugars before surgery?   Check your blood sugar at least 4 times a day, 2 days before surgery to make sure that they are not too high or low.  Check your blood sugar the morning of your surgery when you wake up and every 2  hours until you get to the Short-Stay unit.  Treat a low blood sugar (less than 70 mg/dL) with 1/2 cup of clear juice (cranberry or apple), 4 glucose tablets, OR glucose gel.  Recheck blood sugar in 15 minutes after treatment (to make sure it is greater than 70 mg/dL).  If blood sugar is not greater than 70 mg/dL on re-check, call [phone] for further instructions.   Report your blood sugar to the Short-Stay nurse when you get to Short-Stay.  References:  [facility], 2007 "How to Manage your Diabetes Before and After Surgery".  What do I do about my diabetes medications?   Do not take oral diabetes medicines (pills) the morning of surgery.(metformin)   Do not wear jewelry.  Do not wear lotions, powders, or cologne.  You may wear deodorant.  Men may shave face and neck.  Do not bring valuables to the hospital.  [facility] is not responsible for any belongings or valuables.  Contacts, dentures or bridgework may not be worn into surgery.  Leave your suitcase in the car.  After surgery it may be brought to your room.  For patients admitted to the hospital, discharge time will be determined by your treatment team.  Patients discharged the day of surgery will not be allowed to drive home.   Special instructions:  [facility] - Preparing for Surgery  Before surgery, you can play an important role.  Because skin is not sterile, your skin needs to be as free of germs as possible.  You can reduce the number of germs on you skin by washing with CHG (chlorahexidine gluconate) soap before surgery.  CHG is an antiseptic cleaner which kills germs and bonds with the skin to continue killing germs even after washing.  Please DO NOT use if you have an allergy to CHG or antibacterial soaps.  If your skin becomes reddened/irritated stop using the CHG and inform your nurse when you arrive at Short Stay.  Do not shave (including legs and underarms) for at least 48 hours  prior to the first CHG shower.  You may shave your face.  Please follow these instructions carefully:   1.  Shower with CHG Soap the night before surgery and the                                morning of Surgery.  2.  If you choose to wash your hair, wash your hair first as usual with your       normal shampoo.  3.  After you shampoo, rinse your hair and body thoroughly to remove the                      Shampoo.  4.  Use CHG as you would any other liquid soap.  You can apply chg directly       to the skin and wash gently with scrungie or a clean washcloth.  5.  Apply the CHG Soap to your body ONLY FROM THE NECK DOWN.        Do not use on open wounds or open sores.  Avoid contact with your eyes, ears, mouth and genitals (private parts).  Wash genitals (private parts) with your normal soap.  6.  Wash thoroughly, paying special attention to the area where your surgery        will be performed.  7.  Thoroughly rinse your body with warm water from the neck down.  8.  DO NOT shower/wash with your normal soap after using and rinsing off       the CHG Soap.  9.  Pat yourself dry with a clean towel.            10.  Wear clean pajamas.            11.  Place clean sheets on your bed the night of your first shower and do not        sleep with pets.  Day of Surgery  Do not apply any lotions the morning of surgery.  Please wear clean clothes to the hospital.   Please read over the following fact sheets that you were given. Pain Booklet, Coughing and Deep Breathing and Surgical Site Infection Prevention

## 2015-06-14 LAB — HEMOGLOBIN A1C
Hgb A1c MFr Bld: 6.3 % — ABNORMAL HIGH (ref 4.8–5.6)
Mean Plasma Glucose: 134 mg/dL

## 2015-06-17 ENCOUNTER — Ambulatory Visit (HOSPITAL_COMMUNITY)
Admission: RE | Admit: 2015-06-17 | Discharge: 2015-06-17 | Disposition: A | Discharge status: Home / Self Care | Payer: Medicare Other | Source: Ambulatory Visit | Attending: Thoracic Surgery (Cardiothoracic Vascular Surgery) | Admitting: Thoracic Surgery (Cardiothoracic Vascular Surgery)

## 2015-06-17 ENCOUNTER — Encounter (HOSPITAL_COMMUNITY): Payer: Self-pay | Admitting: *Deleted

## 2015-06-17 ENCOUNTER — Ambulatory Visit (HOSPITAL_COMMUNITY): Payer: Medicare Other

## 2015-06-17 ENCOUNTER — Encounter (HOSPITAL_COMMUNITY)
Admission: RE | Disposition: A | Discharge status: Home / Self Care | Payer: Self-pay | Source: Ambulatory Visit | Attending: Thoracic Surgery (Cardiothoracic Vascular Surgery)

## 2015-06-17 ENCOUNTER — Ambulatory Visit (HOSPITAL_COMMUNITY): Payer: Medicare Other | Admitting: Anesthesiology

## 2015-06-17 DIAGNOSIS — E1142 Type 2 diabetes mellitus with diabetic polyneuropathy: Secondary | ICD-10-CM | POA: Diagnosis not present

## 2015-06-17 DIAGNOSIS — C3412 Malignant neoplasm of upper lobe, left bronchus or lung: Secondary | ICD-10-CM | POA: Insufficient documentation

## 2015-06-17 DIAGNOSIS — R918 Other nonspecific abnormal finding of lung field: Secondary | ICD-10-CM

## 2015-06-17 DIAGNOSIS — I129 Hypertensive chronic kidney disease with stage 1 through stage 4 chronic kidney disease, or unspecified chronic kidney disease: Secondary | ICD-10-CM | POA: Insufficient documentation

## 2015-06-17 DIAGNOSIS — J449 Chronic obstructive pulmonary disease, unspecified: Secondary | ICD-10-CM | POA: Insufficient documentation

## 2015-06-17 DIAGNOSIS — Z7984 Long term (current) use of oral hypoglycemic drugs: Secondary | ICD-10-CM | POA: Insufficient documentation

## 2015-06-17 DIAGNOSIS — K219 Gastro-esophageal reflux disease without esophagitis: Secondary | ICD-10-CM | POA: Diagnosis not present

## 2015-06-17 DIAGNOSIS — Z79899 Other long term (current) drug therapy: Secondary | ICD-10-CM | POA: Diagnosis not present

## 2015-06-17 DIAGNOSIS — E1122 Type 2 diabetes mellitus with diabetic chronic kidney disease: Secondary | ICD-10-CM | POA: Insufficient documentation

## 2015-06-17 DIAGNOSIS — N189 Chronic kidney disease, unspecified: Secondary | ICD-10-CM | POA: Insufficient documentation

## 2015-06-17 DIAGNOSIS — E785 Hyperlipidemia, unspecified: Secondary | ICD-10-CM | POA: Diagnosis not present

## 2015-06-17 DIAGNOSIS — Z87891 Personal history of nicotine dependence: Secondary | ICD-10-CM | POA: Insufficient documentation

## 2015-06-17 DIAGNOSIS — Z418 Encounter for other procedures for purposes other than remedying health state: Secondary | ICD-10-CM

## 2015-06-17 DIAGNOSIS — M1990 Unspecified osteoarthritis, unspecified site: Secondary | ICD-10-CM | POA: Diagnosis not present

## 2015-06-17 DIAGNOSIS — Z77098 Contact with and (suspected) exposure to other hazardous, chiefly nonmedicinal, chemicals: Secondary | ICD-10-CM | POA: Insufficient documentation

## 2015-06-17 DIAGNOSIS — C3411 Malignant neoplasm of upper lobe, right bronchus or lung: Secondary | ICD-10-CM | POA: Diagnosis not present

## 2015-06-17 DIAGNOSIS — R911 Solitary pulmonary nodule: Secondary | ICD-10-CM | POA: Diagnosis not present

## 2015-06-17 DIAGNOSIS — Z419 Encounter for procedure for purposes other than remedying health state, unspecified: Secondary | ICD-10-CM

## 2015-06-17 HISTORY — PX: VIDEO BRONCHOSCOPY WITH ENDOBRONCHIAL NAVIGATION: SHX6175

## 2015-06-17 HISTORY — PX: VIDEO BRONCHOSCOPY: SHX5072

## 2015-06-17 HISTORY — PX: VIDEO BRONCHOSCOPY WITH ENDOBRONCHIAL ULTRASOUND: SHX6177

## 2015-06-17 LAB — GLUCOSE, CAPILLARY
Glucose-Capillary: 87 mg/dL (ref 65–99)
Glucose-Capillary: 119 mg/dL — ABNORMAL HIGH (ref 65–99)

## 2015-06-17 SURGERY — VIDEO BRONCHOSCOPY WITH ENDOBRONCHIAL NAVIGATION
Anesthesia: General | Site: Chest

## 2015-06-17 MED ORDER — DEXAMETHASONE SODIUM PHOSPHATE 4 MG/ML IJ SOLN
INTRAMUSCULAR | Status: DC | PRN
  Administered 2015-06-17: 4 mg via INTRAVENOUS

## 2015-06-17 MED ORDER — STERILE WATER FOR INJECTION IJ SOLN
INTRAMUSCULAR | Status: AC
  Filled 2015-06-17: qty 10

## 2015-06-17 MED ORDER — ROCURONIUM BROMIDE 100 MG/10ML IV SOLN
INTRAVENOUS | Status: DC | PRN
  Administered 2015-06-17: 50 mg via INTRAVENOUS
  Administered 2015-06-17: 10 mg via INTRAVENOUS

## 2015-06-17 MED ORDER — EPHEDRINE SULFATE 50 MG/ML IJ SOLN
INTRAMUSCULAR | Status: AC
  Filled 2015-06-17: qty 1

## 2015-06-17 MED ORDER — PHENYLEPHRINE 40 MCG/ML (10ML) SYRINGE FOR IV PUSH (FOR BLOOD PRESSURE SUPPORT)
PREFILLED_SYRINGE | INTRAVENOUS | Status: AC
  Filled 2015-06-17: qty 10

## 2015-06-17 MED ORDER — ROCURONIUM BROMIDE 50 MG/5ML IV SOLN
INTRAVENOUS | Status: AC
  Filled 2015-06-17: qty 1

## 2015-06-17 MED ORDER — PROPOFOL 10 MG/ML IV BOLUS
INTRAVENOUS | Status: AC
  Filled 2015-06-17: qty 20

## 2015-06-17 MED ORDER — LACTATED RINGERS IV SOLN
INTRAVENOUS | Status: DC | PRN
  Administered 2015-06-17 (×2): via INTRAVENOUS

## 2015-06-17 MED ORDER — ONDANSETRON HCL 4 MG/2ML IJ SOLN
INTRAMUSCULAR | Status: AC
  Filled 2015-06-17: qty 2

## 2015-06-17 MED ORDER — EPINEPHRINE HCL 1 MG/ML IJ SOLN
INTRAMUSCULAR | Status: AC
  Filled 2015-06-17: qty 1

## 2015-06-17 MED ORDER — EPHEDRINE SULFATE 50 MG/ML IJ SOLN
INTRAMUSCULAR | Status: DC | PRN
  Administered 2015-06-17: 5 mg via INTRAVENOUS
  Administered 2015-06-17 (×2): 10 mg via INTRAVENOUS
  Administered 2015-06-17: 5 mg via INTRAVENOUS

## 2015-06-17 MED ORDER — PHENYLEPHRINE HCL 10 MG/ML IJ SOLN
INTRAMUSCULAR | Status: DC | PRN
  Administered 2015-06-17 (×2): 160 ug via INTRAVENOUS

## 2015-06-17 MED ORDER — LACTATED RINGERS IV SOLN: INTRAVENOUS | Status: DC

## 2015-06-17 MED ORDER — LIDOCAINE HCL (CARDIAC) 20 MG/ML IV SOLN
INTRAVENOUS | Status: AC
  Filled 2015-06-17: qty 5

## 2015-06-17 MED ORDER — SUCCINYLCHOLINE CHLORIDE 20 MG/ML IJ SOLN
INTRAMUSCULAR | Status: DC | PRN
  Administered 2015-06-17: 140 mg via INTRAVENOUS

## 2015-06-17 MED ORDER — SUCCINYLCHOLINE CHLORIDE 20 MG/ML IJ SOLN
INTRAMUSCULAR | Status: AC
  Filled 2015-06-17: qty 1

## 2015-06-17 MED ORDER — GLYCOPYRROLATE 0.2 MG/ML IJ SOLN
INTRAMUSCULAR | Status: AC
  Filled 2015-06-17: qty 3

## 2015-06-17 MED ORDER — FENTANYL CITRATE (PF) 100 MCG/2ML IJ SOLN
INTRAMUSCULAR | Status: DC | PRN
  Administered 2015-06-17 (×2): 50 ug via INTRAVENOUS
  Administered 2015-06-17: 100 ug via INTRAVENOUS
  Administered 2015-06-17: 50 ug via INTRAVENOUS

## 2015-06-17 MED ORDER — ONDANSETRON HCL 4 MG/2ML IJ SOLN
INTRAMUSCULAR | Status: DC | PRN
  Administered 2015-06-17: 4 mg via INTRAVENOUS

## 2015-06-17 MED ORDER — SUGAMMADEX SODIUM 200 MG/2ML IV SOLN
INTRAVENOUS | Status: AC
  Filled 2015-06-17: qty 2

## 2015-06-17 MED ORDER — SUGAMMADEX SODIUM 200 MG/2ML IV SOLN
INTRAVENOUS | Status: DC | PRN
  Administered 2015-06-17: 200 mg via INTRAVENOUS

## 2015-06-17 MED ORDER — PHENYLEPHRINE HCL 10 MG/ML IJ SOLN
10.0000 mg | INTRAVENOUS | Status: DC | PRN
  Administered 2015-06-17: 30 ug/min via INTRAVENOUS

## 2015-06-17 MED ORDER — PROMETHAZINE HCL 25 MG/ML IJ SOLN
6.2500 mg | INTRAMUSCULAR | Status: DC | PRN
  Administered 2015-06-17: 6.25 mg via INTRAVENOUS

## 2015-06-17 MED ORDER — LIDOCAINE HCL (CARDIAC) 20 MG/ML IV SOLN
INTRAVENOUS | Status: DC | PRN
  Administered 2015-06-17: 100 mg via INTRAVENOUS

## 2015-06-17 MED ORDER — 0.9 % SODIUM CHLORIDE (POUR BTL) OPTIME
TOPICAL | Status: DC | PRN
  Administered 2015-06-17: 1000 mL

## 2015-06-17 MED ORDER — NEOSTIGMINE METHYLSULFATE 10 MG/10ML IV SOLN
INTRAVENOUS | Status: AC
  Filled 2015-06-17: qty 1

## 2015-06-17 MED ORDER — PROMETHAZINE HCL 25 MG/ML IJ SOLN
INTRAMUSCULAR | Status: AC
  Filled 2015-06-17: qty 1

## 2015-06-17 MED ORDER — ALBUMIN HUMAN 5 % IV SOLN
INTRAVENOUS | Status: DC | PRN
  Administered 2015-06-17: 08:00:00 via INTRAVENOUS

## 2015-06-17 MED ORDER — HYDROMORPHONE HCL 1 MG/ML IJ SOLN: 0.2500 mg | INTRAMUSCULAR | Status: DC | PRN

## 2015-06-17 MED ORDER — FENTANYL CITRATE (PF) 250 MCG/5ML IJ SOLN
INTRAMUSCULAR | Status: AC
  Filled 2015-06-17: qty 5

## 2015-06-17 MED ORDER — PROPOFOL 10 MG/ML IV BOLUS
INTRAVENOUS | Status: DC | PRN
  Administered 2015-06-17: 200 mg via INTRAVENOUS

## 2015-06-17 SURGICAL SUPPLY — 40 items
ADAPTER BRONCH F/PENTAX (ADAPTER) ×3 IMPLANT
ADPR BSCP EDG PNTX (ADAPTER) ×1
BRUSH SUPERTRAX BIOPSY (INSTRUMENTS) IMPLANT
BRUSH SUPERTRAX NDL-TIP CYTO (INSTRUMENTS) ×2 IMPLANT
CANISTER SUCTION 2500CC (MISCELLANEOUS) ×3 IMPLANT
CHANNEL WORK EXTEND EDGE 180 (KITS) IMPLANT
CHANNEL WORK EXTEND EDGE 45 (KITS) IMPLANT
CHANNEL WORK EXTEND EDGE 90 (KITS) IMPLANT
CONT SPEC 4OZ CLIKSEAL STRL BL (MISCELLANEOUS) ×20 IMPLANT
COVER TABLE BACK 60X90 (DRAPES) ×3 IMPLANT
FILTER STRAW FLUID ASPIR (MISCELLANEOUS) IMPLANT
FORCEPS BIOP SUPERTRX PREMAR (INSTRUMENTS) ×2 IMPLANT
GAUZE SPONGE 4X4 12PLY STRL (GAUZE/BANDAGES/DRESSINGS) ×3 IMPLANT
GLOVE SURG SIGNA 7.5 PF LTX (GLOVE) ×3 IMPLANT
GOWN STRL REUS W/ TWL XL LVL3 (GOWN DISPOSABLE) ×1 IMPLANT
GOWN STRL REUS W/TWL XL LVL3 (GOWN DISPOSABLE) ×3
KIT CLEAN ENDO COMPLIANCE (KITS) ×3 IMPLANT
KIT PROCEDURE EDGE 180 (KITS) ×2 IMPLANT
KIT PROCEDURE EDGE 45 (KITS) IMPLANT
KIT PROCEDURE EDGE 90 (KITS) IMPLANT
KIT ROOM TURNOVER OR (KITS) ×3 IMPLANT
MARKER SKIN DUAL TIP RULER LAB (MISCELLANEOUS) ×3 IMPLANT
NDL ECHOTIP HI DEF 22GA (NEEDLE) IMPLANT
NDL SUPERTRX PREMARK BIOPSY (NEEDLE) IMPLANT
NEEDLE ECHOTIP HI DEF 22GA (NEEDLE) ×3 IMPLANT
NEEDLE SUPERTRX PREMARK BIOPSY (NEEDLE) ×3 IMPLANT
NS IRRIG 1000ML POUR BTL (IV SOLUTION) ×3 IMPLANT
OIL SILICONE PENTAX (PARTS (SERVICE/REPAIRS)) ×3 IMPLANT
PAD ARMBOARD 7.5X6 YLW CONV (MISCELLANEOUS) ×6 IMPLANT
PATCHES PATIENT (LABEL) ×9 IMPLANT
SYR 20CC LL (SYRINGE) ×3 IMPLANT
SYR 20ML ECCENTRIC (SYRINGE) ×3 IMPLANT
SYR 30ML LL (SYRINGE) ×3 IMPLANT
SYR 5ML LL (SYRINGE) ×3 IMPLANT
SYSTEM GENCUT CORE BIOPSY (NEEDLE) ×2 IMPLANT
TOWEL OR 17X24 6PK STRL BLUE (TOWEL DISPOSABLE) ×3 IMPLANT
TRAP SPECIMEN MUCOUS 40CC (MISCELLANEOUS) ×3 IMPLANT
TUBE CONNECTING 20'X1/4 (TUBING) ×2
TUBE CONNECTING 20X1/4 (TUBING) ×4 IMPLANT
UNDERPAD 30X30 (UNDERPADS AND DIAPERS) ×3 IMPLANT

## 2015-06-17 NOTE — Anesthesia Procedure Notes (Signed)
Procedure Name: Intubation Date/Time: 06/17/2015 7:37 AM Performed by: Shirlyn Goltz Pre-anesthesia Checklist: Patient identified, Emergency Drugs available, Suction available and Patient being monitored Patient Re-evaluated:Patient Re-evaluated prior to inductionOxygen Delivery Method: Circle system utilized Preoxygenation: Pre-oxygenation with 100% oxygen Intubation Type: IV induction Ventilation: Mask ventilation without difficulty Laryngoscope Size: Mac and 4 Grade View: Grade I Tube type: Oral Tube size: 9.0 mm Number of attempts: 1 Airway Equipment and Method: Stylet Placement Confirmation: ETT inserted through vocal cords under direct vision,  positive ETCO2 and breath sounds checked- equal and bilateral Secured at: 24 cm Tube secured with: Tape Dental Injury: Teeth and Oropharynx as per pre-operative assessment

## 2015-06-17 NOTE — Brief Op Note (Addendum)
06/17/2015  9:48 AM  PATIENT:  Jonathan [name]  75 y.o. 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   Delay start of Pharmacological VTE agent (>24hrs) due to surgical blood loss or risk of bleeding: not applicable

## 2015-06-17 NOTE — Anesthesia Preprocedure Evaluation (Addendum)
Anesthesia Evaluation  Patient identified by MRN, date of birth, ID band Patient awake    Reviewed: Allergy & Precautions, H&P , NPO status , Patient's Chart, lab work & pertinent test results  History of Anesthesia Complications Negative for: history of anesthetic complications  Airway Mallampati: III  TM Distance: >3 FB Neck ROM: Full    Dental no notable dental hx. (+) Poor Dentition, Missing, Dental Advisory Given   Pulmonary shortness of breath and with exertion, COPD,  COPD inhaler, Current Smoker, former smoker,    Pulmonary exam normal breath sounds clear to auscultation       Cardiovascular hypertension, Pt. on medications (-) angina+ Peripheral Vascular Disease  (-) Past MI and (-) CHF Normal cardiovascular exam Rhythm:Regular Rate:Normal  Grade 1 diastolic dysfunction. ? AAA patient denies   Neuro/Psych PSYCHIATRIC DISORDERS Anxiety Depression Ulnar nerve lesion right side. Polyneuropathy. Gait disorder. vertigo  Neuromuscular disease negative psych ROS   GI/Hepatic negative GI ROS, Neg liver ROS, hiatal hernia, GERD  Medicated and Controlled,  Endo/Other  diabetes, Well Controlled, Type 2, Oral Hypoglycemic Agents  Renal/GU   negative genitourinary   Musculoskeletal  (+) Arthritis ,   Abdominal   Peds  Hematology negative hematology ROS (+)   Anesthesia Other Findings   Reproductive/Obstetrics negative OB ROS                           Anesthesia Physical Anesthesia Plan  ASA: III  Anesthesia Plan: General   Post-op Pain Management:    Induction: Intravenous  Airway Management Planned: Oral ETT  Additional Equipment:   Intra-op Plan:   Post-operative Plan: Extubation in OR  Informed Consent: I have reviewed the patients History and Physical, chart, labs and discussed the procedure including the risks, benefits and alternatives for the proposed anesthesia with the  patient or authorized representative who has indicated his/her understanding and acceptance.   Dental Advisory Given  Plan Discussed with: CRNA and Surgeon  Anesthesia Plan Comments:        Anesthesia Quick Evaluation

## 2015-06-17 NOTE — Transfer of Care (Signed)
Immediate Anesthesia Transfer of Care Note  Patient: NATIVIDAD HALLS  Procedure(s) Performed: Procedure(s): VIDEO BRONCHOSCOPY WITH ENDOBRONCHIAL NAVIGATION (N/A) VIDEO BRONCHOSCOPY (N/A) VIDEO BRONCHOSCOPY WITH ENDOBRONCHIAL ULTRASOUND (N/A)  Patient Location: PACU  Anesthesia Type:General  Level of Consciousness: awake, alert , oriented and patient cooperative  Airway & Oxygen Therapy: Patient Spontanous Breathing and Patient connected to face mask oxygen  Post-op Assessment: Report given to RN and Post -op Vital signs reviewed and stable  Post vital signs: Reviewed and stable  Last Vitals:  Filed Vitals:   06/17/15 0608  BP: 126/74  Pulse: 76  Temp: 36.8 C  Resp: 20    Complications: No apparent anesthesia complications

## 2015-06-17 NOTE — Anesthesia Postprocedure Evaluation (Signed)
Anesthesia Post Note  Patient: Jonathan [name]  Procedure(s) Performed: Procedure(s) (LRB): VIDEO BRONCHOSCOPY WITH ENDOBRONCHIAL NAVIGATION (N/A) VIDEO BRONCHOSCOPY (N/A) VIDEO BRONCHOSCOPY WITH ENDOBRONCHIAL ULTRASOUND (N/A)  Patient location during evaluation: PACU Anesthesia Type: General Level of consciousness: awake and alert Pain management: pain level controlled Vital Signs Assessment: post-procedure vital signs reviewed and stable Respiratory status: spontaneous breathing, nonlabored ventilation, respiratory function stable and patient connected to nasal cannula oxygen Cardiovascular status: blood pressure returned to baseline and stable Postop Assessment: no signs of nausea or vomiting Anesthetic complications: no    Last Vitals:  Filed Vitals:   06/17/15 1030 06/17/15 1045  BP: 131/72 120/69  Pulse: 71 74  Temp:    Resp: 14 16    Last Pain:  Filed Vitals:   06/17/15 1054  PainSc: 8                  Helga Asbury L

## 2015-06-17 NOTE — Interval H&P Note (Signed)
History and Physical Interval Note:  06/17/2015 7:21 AM  Jonathan [name]  has presented today for surgery, with the diagnosis of BILATERAL UPPER LOBE NODULES  The various methods of treatment have been discussed with the patient and family. After consideration of risks, benefits and other options for treatment, the patient has consented to  Procedure(s): [place] (N/A) as a surgical intervention .  The patient's history has been reviewed, patient examined, no change in status, stable for surgery.  I have reviewed the patient's chart and labs.  Questions were answered to the patient's satisfaction.     Melrose Nakayama

## 2015-06-17 NOTE — H&P (View-Only) (Signed)
PCP is Celedonio Savage, MD Referring Provider is Darovsky, Marko Stai, MD  Chief Complaint  Patient presents with  . Lung Lesion    Surgical eval, Super D Chest CT today,  C/A/P CT 05/04/15, MR Head 05/04/15    HPI: Mr. Dearden is sent for evaluation for bilateral pulmonary nodules.  Mr. Teale is a 75 year old man with past medical history significant for tobacco abuse (50+ pack years), COPD, type 2 diabetes, hypertension, hyperlipidemia, gastroesophageal reflux, abdominal aortic aneurysm, neuropathy (secondary to agent orange exposure), arthritis, recent right ankle replacement, and chronic kidney disease. The details are murky but he has been followed for a right upper lobe nodule. He recently had a CT scan that showed a right upper lobe nodule had increased in size and there were branching tubular densities around it. There also was a new cavitary lesion in the left upper lobe. He saw Dr. Jacquiline Doe. A PET CT showed both lesions were markedly hypermetabolic, but there were no other worrisome findings. Specifically there was no hilar or mediastinal adenopathy.  Mr. Auman denies any history of cardiac disease. He smoked until November 2016. He smoked about a pack a day for over 50 years. He had a right ankle replacement in January of this year and his activities have been limited since then. He is currently walking with a boot. He gets short of breath with heavy exertion but has not noticed any change in that recently. He denies any chest pain, pressure, or tightness. He's lost about 5 pounds over the past 3 months and 10 pounds over the past 6 months. He has an occasional cough, but denies wheezing or hemoptysis.  Zubrod Score: At the time of surgery this patient's most appropriate activity status/level should be described as: '[]'$     0    Normal activity, no symptoms '[x]'$     1    Restricted in physical strenuous activity but ambulatory, able to do out light work '[]'$     2    Ambulatory and capable of self  care, unable to do work activities, up and about >50 % of waking hours                              '[]'$     3    Only limited self care, in bed greater than 50% of waking hours '[]'$     4    Completely disabled, no self care, confined to bed or chair '[]'$     5    Moribund    Past Medical History  Diagnosis Date  . Nicotine addiction   . Obesity   . Dyslipidemia   . Hypertension   . Elevated WBCs September 2011    and UTI Hospitalized   . Erectile dysfunction   . Arthritis   . Polyneuropathy in other diseases classified elsewhere ([place]) 12/05/2012  . Lesion of ulnar nerve 12/05/2012    Right side  . Gait disorder   . COPD (chronic obstructive pulmonary disease) ([place])     normally have breathing tx prior to surgery  . Pneumonia     hx  . History of kidney stones   . GERD (gastroesophageal reflux disease)   . Neuropathy ([place])   . Vertigo   . Wears dentures     bottom  . Fatty liver   . Abdominal aortic aneurysm ([place])   . Vitamin D deficiency   . Osteoarthritis   . Lumbar spinal stenosis   .  Gastritis   . Hiatal hernia   . History of colon polyps   . Shortness of breath dyspnea   . Anxiety   . Depression   . Emphysema of lung ([place])   . Diabetes mellitus without complication ([place])     borderline no med, controlled with diet  . Kidney stones   . CKD (chronic kidney disease)   . Osteoporosis     Past Surgical History  Procedure Laterality Date  . Bladder surgery  02/2011    For Diverticulum of bladder removed  . Colon polyp resection    . Cysto    . Cholecystectomy N/A 02/24/2014    Procedure: LAPAROSCOPIC CHOLECYSTECTOMY;  Surgeon: Coralie Keens, MD;  Location: [facility];  Service: General;  Laterality: N/A;  . Colonoscopy    . Carpal tunnel release Right 07/09/2014    Procedure: RIGHT OPEN CARPAL TUNNEL RELEASE;  Surgeon: Mcarthur Rossetti, MD;  Location: [facility];  Service: Orthopedics;  Laterality: Right;  . Ankle arthroplasty Right 04/20/2015  .  Total ankle arthroplasty Right 04/20/2015    Procedure: TOTAL ANKLE ARTHOPLASTY;  Surgeon: Newt Minion, MD;  Location: [facility];  Service: Orthopedics;  Laterality: Right;    Family History  Problem Relation Age of Onset  . Cancer Father     Asbestos  . Colon cancer Neg Hx   . Colon polyps Neg Hx   . Kidney disease Neg Hx   . Esophageal cancer Neg Hx   . Gallbladder disease Neg Hx   . Heart disease Neg Hx   . Diabetes Neg Hx     Social History Social History  Substance Use Topics  . Smoking status: Current Every Day Smoker -- 0.50 packs/day for 35 years    Types: Cigarettes  . Smokeless tobacco: Never Used  . Alcohol Use: No    Current Outpatient Prescriptions  Medication Sig Dispense Refill  . albuterol (PROVENTIL HFA;VENTOLIN HFA) 108 (90 BASE) MCG/ACT inhaler Inhale 1 puff into the lungs every 6 (six) hours as needed for wheezing or shortness of breath.    [name] atenolol-chlorthalidone (TENORETIC) 50-25 MG per tablet Take 1 tablet by mouth at bedtime.     [name] esomeprazole (NEXIUM) 40 MG capsule Take 40 mg by mouth daily.     . furosemide (LASIX) 40 MG tablet Take 40 mg by mouth daily as needed for fluid.    [name] gabapentin (NEURONTIN) 400 MG capsule Take 400 mg by mouth 2 (two) times daily.     [name] ipratropium-albuterol (DUONEB) 0.5-2.5 (3) MG/3ML SOLN Take 3 mLs by nebulization 3 (three) times daily as needed (wheezing).     . meclizine (ANTIVERT) 50 MG tablet Take 50 mg by mouth 2 (two) times daily as needed for dizziness.     . metFORMIN (GLUCOPHAGE) 500 MG tablet Take 250 mg by mouth daily with breakfast.     . metoCLOPramide (REGLAN) 5 MG tablet Take 5 mg by mouth 4 (four) times daily.    . Misc Natural Products (COLON CLEANSE PO) Take 1 capsule by mouth daily.    . mupirocin cream (BACTROBAN) 2 % Apply 1 application topically daily as needed (Rash).    . nicotine polacrilex (NICORETTE) 4 MG gum Take 4 mg by mouth as needed for smoking cessation.    . nitroGLYCERIN (NITROSTAT) 0.4  MG SL tablet Place 0.4 mg under the tongue every 5 (five) minutes as needed for chest pain.    . Oxycodone HCl 10 MG TABS Take 10  mg by mouth 3 (three) times daily as needed.    . potassium chloride (K-DUR) 10 MEQ tablet Take 10 mEq by mouth daily.    . predniSONE (DELTASONE) 10 MG tablet Take 20 mg by mouth daily as needed. For gout per patient    . Probiotic Product (PROBIOTIC DAILY PO) Take 1 capsule by mouth daily as needed.    . sennosides-docusate sodium (SENOKOT-S) 8.6-50 MG tablet Take 1 tablet by mouth every other day.    . sildenafil (VIAGRA) 100 MG tablet Take 100 mg by mouth daily as needed for erectile dysfunction. One tablet 30 minutes before intercourse, maximum twice weekly    . tamsulosin (FLOMAX) 0.4 MG CAPS capsule Take 0.4 mg by mouth daily as needed. For bladder per patient    . tiotropium (SPIRIVA) 18 MCG inhalation capsule Place 18 mcg into inhaler and inhale daily as needed (Shortness of breath).      No current facility-administered medications for this visit.   Facility-Administered Medications Ordered in Other Visits  Medication Dose Route Frequency Provider Last Rate Last Dose  . lactated ringers infusion    Continuous PRN Kerby Less, CRNA 0 mL/hr at 05/15/13 0750      Allergies  Allergen Reactions  . Lisinopril Other (See Comments)    nervous  . Ciprofloxacin Other (See Comments)    Doesn't work  . Levaquin [Levofloxacin] Other (See Comments)    Doesn't work  . Triamterene-Hctz Other (See Comments)    agitation    Review of Systems  Constitutional: Positive for unexpected weight change (has losr 5 pounds in 3 months). Negative for fever, chills, diaphoresis and activity change.  HENT: Positive for dental problem (dentures). Negative for trouble swallowing and voice change.   Eyes: Positive for visual disturbance (blurry).  Respiratory: Positive for shortness of breath (with heavy exertion).   Cardiovascular: Positive for leg swelling. Negative  for chest pain and palpitations.  Gastrointestinal: Positive for abdominal pain (Reflux). Negative for blood in stool.  Genitourinary: Positive for frequency. Negative for difficulty urinating.  Musculoskeletal: Positive for joint swelling and arthralgias.       Recent right ankle replacement  Skin: Positive for rash.  Neurological: Positive for numbness. Negative for weakness and headaches.  Hematological: Negative for adenopathy. Does not bruise/bleed easily.  Psychiatric/Behavioral: Positive for dysphoric mood. The patient is nervous/anxious.     BP 140/84 mmHg  Pulse 76  Resp 20  Ht '6\' 8"'$  (2.032 m)  Wt 260 lb (117.935 kg)  BMI 28.56 kg/m2  SpO2 98% Physical Exam  Constitutional: He is oriented to person, place, and time. He appears well-developed and well-nourished. No distress.  HENT:  Head: Normocephalic and atraumatic.  Mouth/Throat: No oropharyngeal exudate.  Eyes: Conjunctivae and EOM are normal. No scleral icterus.  Neck: Neck supple. No thyromegaly present.  Cardiovascular: Normal rate, regular rhythm, normal heart sounds and intact distal pulses.  Exam reveals no gallop and no friction rub.   No murmur heard. Pulmonary/Chest: Effort normal and breath sounds normal. He has no wheezes. He has no rales.  Abdominal: Soft. He exhibits no distension. There is no tenderness.  Musculoskeletal: He exhibits no edema.  Lymphadenopathy:    He has no cervical adenopathy.  Neurological: He is alert and oriented to person, place, and time. No cranial nerve deficit.  No focal motor deficit, numb lateral aspect right forearm and hand  Skin: Skin is warm and dry.  Vitals reviewed.    Diagnostic Tests: CT CHEST WITHOUT CONTRAST  TECHNIQUE: Multidetector CT imaging of the chest was performed using thin slice collimation for electromagnetic bronchoscopy planning purposes, without intravenous contrast.  COMPARISON: 05/04/2015 CT and 05/16/2015  PET.  FINDINGS: Mediastinum/Lymph Nodes: Tortuous descending thoracic aorta. Aortic and branch vessel atherosclerosis. Normal heart size, without pericardial effusion. No mediastinal or definite hilar adenopathy, given limitations of unenhanced CT.  Lungs/Pleura: No pleural fluid. Mixed attenuation right upper lobe pulmonary nodule with solid component measuring 2.2 x 1.9 cm on image 25/series 4. 2.0 x 1.7 cm on the prior CT.  Cavitary left upper lobe pulmonary nodule measures 1.3 x 1.4 cm on image 25/series 4. Compare 1.3 x 1.2 cm on the prior exam (when remeasured).  Upper abdomen: Mild hepatic steatosis. Subtle irregular hepatic capsule, including on image 73/series 3. Normal imaged portions of the spleen, stomach, pancreas adrenal glands, left kidney.  Musculoskeletal: Moderate thoracic spondylosis.  IMPRESSION: 1. bilateral upper lobe pulmonary nodules, as previously. 2. No thoracic adenopathy. 3. Hepatic steatosis. Subtle irregular hepatic capsule is suspicious for cirrhosis. Correlate with risk factors.   Electronically Signed  By: Abigail Miyamoto M.D.  On: 06/01/2015 15:02  Pulmonary function testing  FVC= 3.78(68%) FEV1= 2.44(59%), post 2.50(60%) FEV1/FVC= 0.66 DLCO= 24.36 (53%)  I personally reviewed the CT and PET/CT and concur with findings as noted above. PET/CT showed both the right upper lobe and the left upper lobe nodules were markedly hypermetabolic. There were no other suspicious findings.  Impression: Mr. Bose is a 75 year old man with a history of tobacco abuse who has bilateral upper lobe lung nodules. The right upper lobe nodule is more solid and has some branching tubular densities around it. The left upper lobe nodule is cystic in nature but with a thick wall. Both of these lesions were markedly hypermetabolic on PET/CT. They're both highly suspicious for synchronous primary bronchogenic carcinomas.   He currently is somewhat limited  physically due to his recent ankle surgery, but I do think he has adequate pulmonary and overall physiologic reserve to tolerate surgical resection.  I recommended to Mr. Hickel that we do a navigational bronchoscopy to sample of both lesions at the same setting. Given the appearance of the right upper lobe nodule there is an outside chance this could be infectious in nature. We will plan to do AFB and fungal cultures at the time of the procedure. He does understand that there is a possibility that the biopsies could be nondiagnostic. I described the procedure to him in detail. He was already familiar with it from reading about it. He understands that this would be done under general anesthesia on an outpatient basis. There would be no incisions, strictly an endoscopic approach. He understands risk include those associated with general anesthesia and include but are not limited to death, MI, DVT, PE, bleeding, pneumothorax, and failure to make a diagnosis, as well as the possibility of other unforeseeable complications. He accepts the risks and wishes to proceed.  Further down the road there is potential for a staged bilateral resections with a right upper lobectomy followed by a segmental resection of the left upper lobe. That of course depend on the findings at the time of bronchoscopy.  Plan:  Electromagnetic navigational bronchoscopy on Friday, 06/17/2015  I spent 45 minutes with Mr. Norcia during this visit. Greater than 50% of the time was spent in counseling. Melrose Nakayama, MD [facility] [phone]

## 2015-06-17 NOTE — Op Note (Signed)
NAMEMarland Kitchen  EMMERICH, CRYER NO.:  [id number]  MEDICAL RECORD NO.:  [id number]  LOCATION:  [facility]                         FACILITY:  [facility]  PHYSICIAN:  Revonda Standard. Roxan Hockey, M.D.DATE OF BIRTH:  March 23, 1941  DATE OF PROCEDURE:  06/17/2015 DATE OF DISCHARGE:                              OPERATIVE REPORT   PREOPERATIVE DIAGNOSIS:  Bilateral upper lobe nodules.  POSTOPERATIVE DIAGNOSIS:  Bilateral upper lobe nodules.  PROCEDURE:   1. Electromagnetic navigational bronchoscopy with needle aspirations, brushings, and biopsies, and bronchoalveolar lavage, 2. Endobronchial ultrasound with mediastinal lymph node aspirations.  SURGEON:  Revonda Standard. Roxan Hockey, M.D.  ASSISTANT:  None.  ANESTHESIA:  General.  FINDINGS:  Navigated to within 1 cm of each of the upper lobe nodules, mediastinal lymph nodes appeared relatively normal in size.  CLINICAL NOTE:  Mr. Machia is a 75 year old gentleman with a history of tobacco abuse who has been followed for a right upper lobe nodule.  The nodule had increased in size.  The patient also developed a new cavitary lesion in the left upper lobe.  Both lesions were hypermetabolic by PET/CT.  There was no hilar or mediastinal adenopathy.  The patient was advised to undergo navigational bronchoscopy to sample each of the upper node lesions as well as endobronchial ultrasound to sample the mediastinal lymph nodes.  The indications, risks, benefits, and alternatives were discussed in detail with the patient.  He understood and accepted the risks and agreed to proceed.  OPERATIVE NOTE:  Mr. Nolasco was brought to the operating room on June 17, 2015.  He had induction of general anesthesia and was intubated. After performing a time-out, flexible fiberoptic bronchoscopy was performed via the endotracheal tube.  It revealed normal endobronchial anatomy with no endobronchial lesions.  The locatable guide for navigation then was advanced  through the bronchoscope.  Registration was performed. There was good correlation of the video and virtual bronchoscopies.  The left upper lobe cavitary nodule was selected first and the locatable guide was navigated to within 1 cm of the nodule with good alignment. Needle brushings and biopsies were obtained.  A small portion of the biopsy was sent for AFB and fungal cultures.  The remainder was sent for permanent pathology.  Bronchoalveolar lavage was performed and this specimen was sent for pathology as well.  Next, the locatable guide was navigated to within 1 cm of the right upper lobe nodule. New instruments were used to sample this nodule.  Multiple specimens were obtained beginning with needle brushings followed  by needle aspirations, biopsies, and then GenCut aspiration biopsies were performed as well.   Multiple specimens were obtained with each of these modalities.   A portion of the sample was again cultured for AFB and fungus.   Bronchoalveolar lavage was performed again at this site.  The endobronchial ultrasound probe then was advanced and systematic inspection of the mediastinal lymph node stations was carried out.  The nodes appeared relatively normal in size.  Aspirations were taken from the level 7, 4 L, and 4R lymph nodes.  With each aspiration, the needle was advanced into the node.  The inner cannula was removed.  Suction was applied and 10 to 12 passes with  the needle were performed while visualizing with the ultrasound.  Two samplings were performed from level 4R and level 7, one sample was taken from 4 L.  A portion of each sample was sent for immediate examination and the remainder was placed in CytoLyt for cell block.  After completing the aspirations, a final inspection was made.  There was no ongoing bleeding noted in the airways.  The endobronchial ultrasound probe was removed.  The patient was extubated in the operating room and taken to the postanesthetic  care unit in good condition.     Revonda Standard Roxan Hockey, M.D.     SCH/MEDQ  D:  06/17/2015  T:  06/17/2015  Job:  [id number]

## 2015-06-17 NOTE — Discharge Instructions (Signed)
Do not drive or engage in heavy physical activity for 24 hours  You may resume normal activities tomorrow  You may cough up small amounts of blood over the next few days  Call [phone] if you develop chest pain, shortness of breath, fever > 101 F or cough up large amounts of blood  My office will contact you with a follow up appointment

## 2015-06-18 DIAGNOSIS — J449 Chronic obstructive pulmonary disease, unspecified: Secondary | ICD-10-CM | POA: Diagnosis not present

## 2015-06-18 DIAGNOSIS — M109 Gout, unspecified: Secondary | ICD-10-CM | POA: Diagnosis not present

## 2015-06-18 DIAGNOSIS — G629 Polyneuropathy, unspecified: Secondary | ICD-10-CM | POA: Diagnosis not present

## 2015-06-18 DIAGNOSIS — M1990 Unspecified osteoarthritis, unspecified site: Secondary | ICD-10-CM | POA: Diagnosis not present

## 2015-06-18 DIAGNOSIS — I10 Essential (primary) hypertension: Secondary | ICD-10-CM | POA: Diagnosis not present

## 2015-06-18 DIAGNOSIS — Z471 Aftercare following joint replacement surgery: Secondary | ICD-10-CM | POA: Diagnosis not present

## 2015-06-18 LAB — ACID FAST SMEAR (AFB, MYCOBACTERIA)
Acid Fast Smear: NEGATIVE
Acid Fast Smear: NEGATIVE
Acid Fast Smear: NEGATIVE

## 2015-06-20 ENCOUNTER — Encounter (HOSPITAL_COMMUNITY): Payer: Self-pay | Admitting: Thoracic Surgery (Cardiothoracic Vascular Surgery)

## 2015-06-20 LAB — CULTURE, RESPIRATORY

## 2015-06-20 LAB — CULTURE, RESPIRATORY W GRAM STAIN
Culture: NO GROWTH
Gram Stain: NONE SEEN

## 2015-06-21 ENCOUNTER — Encounter: Payer: Self-pay | Admitting: Thoracic Surgery (Cardiothoracic Vascular Surgery)

## 2015-06-21 ENCOUNTER — Ambulatory Visit (INDEPENDENT_AMBULATORY_CARE_PROVIDER_SITE_OTHER): Payer: Medicare Other | Admitting: Thoracic Surgery (Cardiothoracic Vascular Surgery)

## 2015-06-21 ENCOUNTER — Other Ambulatory Visit: Payer: Self-pay | Admitting: *Deleted

## 2015-06-21 VITALS — BP 125/76 | HR 82 | Resp 16 | Ht >= 80 in | Wt 259.0 lb

## 2015-06-21 DIAGNOSIS — C3412 Malignant neoplasm of upper lobe, left bronchus or lung: Secondary | ICD-10-CM

## 2015-06-21 DIAGNOSIS — C3411 Malignant neoplasm of upper lobe, right bronchus or lung: Secondary | ICD-10-CM | POA: Diagnosis not present

## 2015-06-21 LAB — TISSUE CULTURE
Culture: NO GROWTH
Culture: NO GROWTH
Culture: NO GROWTH
Gram Stain: NONE SEEN
Gram Stain: NONE SEEN

## 2015-06-21 LAB — CULTURE, RESPIRATORY W GRAM STAIN
Culture: NO GROWTH
Gram Stain: NONE SEEN

## 2015-06-21 LAB — CULTURE, RESPIRATORY

## 2015-06-21 NOTE — Progress Notes (Signed)
[address]       [place] [address]             [phone]       HPI: Jonathan [name] returns today to discuss the results of his navigational bronchoscopy and endobronchial ultrasound.  Jonathan [name] is a 75 year old man with past medical history significant for tobacco abuse (50+ pack years), COPD, type 2 diabetes, hypertension, hyperlipidemia, gastroesophageal reflux, abdominal aortic aneurysm, neuropathy (secondary to agent orange exposure), arthritis, recent right ankle replacement, and chronic kidney disease. The details are murky but he has been followed for a right upper lobe nodule. He recently had a CT scan that showed a right upper lobe nodule had increased in size and there were branching tubular densities around it. There also was a new cavitary lesion in the left upper lobe. He saw Dr. Jacquiline Doe. A PET CT showed both lesions were markedly hypermetabolic, but there were no other worrisome findings. Specifically there was no hilar or mediastinal adenopathy.  Jonathan [name] denies any history of cardiac disease. He smoked until November 2016. He smoked about a pack a day for over 50 years. He had a right ankle replacement in January of this year and his activities have been limited since then. He is currently walking with a boot. He gets short of breath with heavy exertion but has not noticed any change in that recently. He denies any chest pain, pressure, or tightness. He's lost about 5 pounds over the past 3 months and 10 pounds over the past 6 months. He has an occasional cough, but denies wheezing or hemoptysis.  I did a navigational bronchoscopy on 06/17/2015 and biopsied both upper lobe nodules. I also did endobronchial ultrasound to sample the mediastinal lymph nodes. Both the upper lobe nodules were positive for squamous cell carcinoma. Different instruments were used to biopsy each of the upper lobe nodules. The mediastinal lymph nodes were negative although the  samples were suboptimal.  Past Medical History  Diagnosis Date  . Nicotine addiction   . Obesity   . Dyslipidemia   . Hypertension   . Elevated WBCs September 2011    and UTI Hospitalized   . Erectile dysfunction   . Arthritis   . Polyneuropathy in other diseases classified elsewhere ([place]) 12/05/2012  . Lesion of ulnar nerve 12/05/2012    Right side  . Gait disorder   . COPD (chronic obstructive pulmonary disease) ([place])     normally have breathing tx prior to surgery  . Pneumonia     hx  . History of kidney stones   . GERD (gastroesophageal reflux disease)   . Neuropathy ([place])   . Vertigo   . Wears dentures     bottom  . Fatty liver   . Vitamin D deficiency   . Osteoarthritis   . Lumbar spinal stenosis   . Gastritis   . Hiatal hernia   . History of colon polyps   . Shortness of breath dyspnea   . Anxiety   . Depression   . Emphysema of lung ([place])   . Diabetes mellitus without complication ([place])     borderline no med, controlled with diet  . Osteoporosis   . Abdominal aortic aneurysm ([place])     pt denies-  . History of kidney stones   . CKD (chronic kidney disease)     pt denies     Current Outpatient Prescriptions  Medication Sig Dispense Refill  . albuterol (PROVENTIL HFA;VENTOLIN  HFA) 108 (90 BASE) MCG/ACT inhaler Inhale 1 puff into the lungs every 6 (six) hours as needed for wheezing or shortness of breath.    [name] atenolol-chlorthalidone (TENORETIC) 50-25 MG per tablet Take 1 tablet by mouth at bedtime.     [name] esomeprazole (NEXIUM) 40 MG capsule Take 40 mg by mouth daily.     . furosemide (LASIX) 40 MG tablet Take 40 mg by mouth daily as needed for fluid.    [name] gabapentin (NEURONTIN) 400 MG capsule Take 400 mg by mouth 2 (two) times daily.     [name] ipratropium-albuterol (DUONEB) 0.5-2.5 (3) MG/3ML SOLN Take 3 mLs by nebulization 3 (three) times daily as needed (wheezing).     . meclizine (ANTIVERT) 50 MG tablet Take 50 mg by mouth 2 (two) times daily as needed for  dizziness.     . metFORMIN (GLUCOPHAGE) 500 MG tablet Take 250 mg by mouth daily with breakfast.     . metoCLOPramide (REGLAN) 5 MG tablet Take 5 mg by mouth 4 (four) times daily.    . Misc Natural Products (COLON CLEANSE PO) Take 1 capsule by mouth daily.    . mupirocin cream (BACTROBAN) 2 % Apply 1 application topically daily as needed ([place]).    . nicotine polacrilex (NICORETTE) 4 MG gum Take 4 mg by mouth as needed for smoking cessation.    . nitroGLYCERIN (NITROSTAT) 0.4 MG SL tablet Place 0.4 mg under the tongue every 5 (five) minutes as needed for chest pain.    . Oxycodone HCl 10 MG TABS Take 10 mg by mouth 3 (three) times daily as needed.    . potassium chloride (K-DUR) 10 MEQ tablet Take 10 mEq by mouth daily.    . predniSONE (DELTASONE) 10 MG tablet Take 20 mg by mouth daily as needed. For gout per patient    . Probiotic Product (PROBIOTIC DAILY PO) Take 1 capsule by mouth daily as needed.    . sennosides-docusate sodium (SENOKOT-S) 8.6-50 MG tablet Take 1 tablet by mouth every other day.    . sildenafil (VIAGRA) 100 MG tablet Take 100 mg by mouth daily as needed for erectile dysfunction. One tablet 30 minutes before intercourse, maximum twice weekly    . tamsulosin (FLOMAX) 0.4 MG CAPS capsule Take 0.4 mg by mouth daily as needed. For bladder per patient    . tiotropium (SPIRIVA) 18 MCG inhalation capsule Place 18 mcg into inhaler and inhale daily as needed (Shortness of breath).      No current facility-administered medications for this visit.   Facility-Administered Medications Ordered in Other Visits  Medication Dose Route Frequency Provider Last Rate Last Dose  . lactated ringers infusion    Continuous PRN Kerby Less, CRNA 0 mL/hr at 05/15/13 0750      Physical Exam BP 125/76 mmHg  Pulse 82  Resp 16  Ht '6\' 8"'$  (2.032 m)  Wt 259 lb (117.482 kg)  BMI 28.45 kg/m2  SpO34 46% 75 year old man in no acute distress Alert and oriented 3 with no focal motor deficits,  Decreased sensation ulnar distribution right arm No cervical or subclavicular adenopathy Cardiac regular rate and rhythm normal S1 and S2 Lungs clear with equal breath sounds bilaterally Abdomen soft and nontender Extremities- right leg in boot  Diagnostic Tests: I again reviewed his CT and PET CT findings are as noted in previous notes. He has bilateral upper lobe nodules that are hypermetabolic by PET.  Pathology reviewed it showed squamous cell carcinoma in both upper lobe  nodules.  Impression: 75 year old man with a history of tobacco abuse and agent orange exposure who has bilateral upper lobe squamous cell carcinomas. The right upper lobe nodule was present prior to the appearance of the left upper lobe nodule. This may be slightly metachronous primaries with a left upper lobe nodule could represent metastatic stage IV disease. I do think we she give him the benefit of the doubt and consider it separate primaries.  He had questions as to the cause of his lung cancer. The primary cause is likely tobacco abuse. Agent orange exposure is more likely than not to be a contributing factor as well. I did review his veterans file and service medical records.  I recommended that we began with a right VATS and right upper lobectomy with node dissection for treatment of the right upper lobe squamous cell carcinoma. After seeing how he does we can consider surgery or stereotactic radiation for the left upper lobe nodule. I described the proposed operation to him in detail. He understands the general nature of the procedure, the use of general anesthesia, the incisions to be used, the expected hospital stay, and overall recovery. He does understand we used chest tubes postoperatively. I reviewed the indications, risks, benefits, and alternatives. He understands the risk include, but are not limited to death, MI, DVT, PE, bleeding, possible need for transfusion, infection, prolonged air leak, cardiac  arrhythmias, as well as the possibility of other unforeseeable complications.  He understands and accepts the risks. He understands the issues involved with decision making regarding treatment with surgery versus chemotherapy and/or radiation. He strongly desires to proceed with surgery.  Plan: Right VATS, right upper lobectomy, lymph node dissection on Monday, 07/04/2015  Melrose Nakayama, MD [facility] [phone]

## 2015-06-29 ENCOUNTER — Other Ambulatory Visit (HOSPITAL_COMMUNITY): Payer: Self-pay | Admitting: *Deleted

## 2015-06-30 ENCOUNTER — Other Ambulatory Visit: Payer: Self-pay

## 2015-06-30 ENCOUNTER — Encounter (HOSPITAL_COMMUNITY): Payer: Self-pay

## 2015-06-30 ENCOUNTER — Encounter (HOSPITAL_COMMUNITY)
Admission: RE | Admit: 2015-06-30 | Discharge: 2015-06-30 | Disposition: A | Discharge status: Home / Self Care | Payer: Medicare Other | Source: Ambulatory Visit | Attending: Thoracic Surgery (Cardiothoracic Vascular Surgery) | Admitting: Thoracic Surgery (Cardiothoracic Vascular Surgery)

## 2015-06-30 VITALS — BP 140/77 | HR 62 | Temp 98.4°F | Resp 18 | Ht >= 80 in | Wt 271.8 lb

## 2015-06-30 DIAGNOSIS — C3411 Malignant neoplasm of upper lobe, right bronchus or lung: Secondary | ICD-10-CM | POA: Insufficient documentation

## 2015-06-30 DIAGNOSIS — Z01812 Encounter for preprocedural laboratory examination: Secondary | ICD-10-CM | POA: Insufficient documentation

## 2015-06-30 DIAGNOSIS — J449 Chronic obstructive pulmonary disease, unspecified: Secondary | ICD-10-CM | POA: Diagnosis not present

## 2015-06-30 DIAGNOSIS — Z7984 Long term (current) use of oral hypoglycemic drugs: Secondary | ICD-10-CM | POA: Insufficient documentation

## 2015-06-30 DIAGNOSIS — K219 Gastro-esophageal reflux disease without esophagitis: Secondary | ICD-10-CM | POA: Insufficient documentation

## 2015-06-30 DIAGNOSIS — I498 Other specified cardiac arrhythmias: Secondary | ICD-10-CM | POA: Insufficient documentation

## 2015-06-30 DIAGNOSIS — Z79899 Other long term (current) drug therapy: Secondary | ICD-10-CM | POA: Insufficient documentation

## 2015-06-30 DIAGNOSIS — Z01818 Encounter for other preprocedural examination: Secondary | ICD-10-CM | POA: Diagnosis not present

## 2015-06-30 DIAGNOSIS — E785 Hyperlipidemia, unspecified: Secondary | ICD-10-CM | POA: Insufficient documentation

## 2015-06-30 DIAGNOSIS — I10 Essential (primary) hypertension: Secondary | ICD-10-CM | POA: Diagnosis not present

## 2015-06-30 DIAGNOSIS — E1142 Type 2 diabetes mellitus with diabetic polyneuropathy: Secondary | ICD-10-CM | POA: Diagnosis not present

## 2015-06-30 DIAGNOSIS — Z0183 Encounter for blood typing: Secondary | ICD-10-CM | POA: Diagnosis not present

## 2015-06-30 LAB — CBC
HCT: 36.9 % — ABNORMAL LOW (ref 39.0–52.0)
Hemoglobin: 12.2 g/dL — ABNORMAL LOW (ref 13.0–17.0)
MCH: 30.3 pg (ref 26.0–34.0)
MCHC: 33.1 g/dL (ref 30.0–36.0)
MCV: 91.8 fL (ref 78.0–100.0)
Platelets: 160 10*3/uL (ref 150–400)
RBC: 4.02 MIL/uL — ABNORMAL LOW (ref 4.22–5.81)
RDW: 13.8 % (ref 11.5–15.5)
WBC: 9.7 10*3/uL (ref 4.0–10.5)

## 2015-06-30 LAB — BLOOD GAS, ARTERIAL
Acid-Base Excess: 2.7 mmol/L — ABNORMAL HIGH (ref 0.0–2.0)
Bicarbonate: 26.6 mEq/L — ABNORMAL HIGH (ref 20.0–24.0)
Drawn by: 421801
FIO2: 0.21
O2 Saturation: 96.6 %
Patient temperature: 98.6
TCO2: 27.8 mmol/L (ref 0–100)
pCO2 arterial: 40 mmHg (ref 35.0–45.0)
pH, Arterial: 7.438 (ref 7.350–7.450)
pO2, Arterial: 86.8 mmHg (ref 80.0–100.0)

## 2015-06-30 LAB — URINALYSIS, ROUTINE W REFLEX MICROSCOPIC
Bilirubin Urine: NEGATIVE
Glucose, UA: NEGATIVE mg/dL
Hgb urine dipstick: NEGATIVE
Ketones, ur: NEGATIVE mg/dL
Nitrite: POSITIVE — AB
Protein, ur: NEGATIVE mg/dL
Specific Gravity, Urine: 1.019 (ref 1.005–1.030)
pH: 6.5 (ref 5.0–8.0)

## 2015-06-30 LAB — TYPE AND SCREEN
ABO/RH(D): A POS
Antibody Screen: NEGATIVE

## 2015-06-30 LAB — SURGICAL PCR SCREEN
MRSA, PCR: NEGATIVE
Staphylococcus aureus: NEGATIVE

## 2015-06-30 LAB — URINE MICROSCOPIC-ADD ON: RBC / HPF: NONE SEEN RBC/hpf (ref 0–5)

## 2015-06-30 LAB — PROTIME-INR
INR: 1.06 (ref 0.00–1.49)
Prothrombin Time: 14 seconds (ref 11.6–15.2)

## 2015-06-30 LAB — COMPREHENSIVE METABOLIC PANEL
ALT: 14 U/L — ABNORMAL LOW (ref 17–63)
AST: 16 U/L (ref 15–41)
Albumin: 3.4 g/dL — ABNORMAL LOW (ref 3.5–5.0)
Alkaline Phosphatase: 51 U/L (ref 38–126)
Anion gap: 10 (ref 5–15)
BUN: 8 mg/dL (ref 6–20)
CO2: 23 mmol/L (ref 22–32)
Calcium: 9.3 mg/dL (ref 8.9–10.3)
Chloride: 105 mmol/L (ref 101–111)
Creatinine, Ser: 1.18 mg/dL (ref 0.61–1.24)
GFR calc Af Amer: >60 mL/min (ref >60)
GFR calc non Af Amer: 59 mL/min — ABNORMAL LOW (ref >60)
Glucose, Bld: 93 mg/dL (ref 65–99)
Potassium: 3.6 mmol/L (ref 3.5–5.1)
Sodium: 138 mmol/L (ref 135–145)
Total Bilirubin: 0.4 mg/dL (ref 0.3–1.2)
Total Protein: 7.2 g/dL (ref 6.5–8.1)

## 2015-06-30 LAB — APTT: aPTT: 29 seconds (ref 24–37)

## 2015-06-30 LAB — GLUCOSE, CAPILLARY: Glucose-Capillary: 106 mg/dL — ABNORMAL HIGH (ref 65–99)

## 2015-06-30 LAB — ABO/RH: ABO/RH(D): A POS

## 2015-06-30 NOTE — Progress Notes (Signed)
Pt. Been here recently, continues to be followed by Dr. Sharol Given. Pt. Reports no anesthesia problems in the past.  Pt. Reports that he has had echo & perhaps o0ther cardiac studies at [facility], will request via fax.

## 2015-06-30 NOTE — Pre-Procedure Instructions (Signed)
Jonathan [name]  06/30/2015      [facility] [place], [place] [address] Phone: [phone] Fax: [phone]    Your procedure is scheduled on 07/04/2015.  Report to [facility] Admitting at 5:30 A.M.  Call this number if you have problems the morning of surgery:  [phone]   Remember:  Do not eat food or drink liquids after midnight.  On Sunday   Take these medicines the morning of surgery with A SIP OF WATER : Nexium & Gabapentin;  You can also use inhalers as needed & take pain medicine if needed    Do not wear jewelry   Do not wear lotions, powders, or perfumes.     Men may shave face and neck.   Do not bring valuables to the hospital.   [facility] is not responsible for any belongings or valuables.  Contacts, dentures or bridgework may not be worn into surgery.  Leave your suitcase in the car.  After surgery it may be brought to your room.  For patients admitted to the hospital, discharge time will be determined by your treatment team.  Patients discharged the day of surgery will not be allowed to drive home.   Name and phone number of your driver:   With family Special instructions:     How to Manage Your Diabetes Before and After Surgery  Why is it important to control my blood sugar before and after surgery? . Improving blood sugar levels before and after surgery helps healing and can limit problems. . A way of improving blood sugar control is eating a healthy diet by: o  Eating less sugar and carbohydrates o  Increasing activity/exercise o  Talking with your doctor about reaching your blood sugar goals . High blood sugars (greater than 180 mg/dL) can raise your risk of infections and slow your recovery, so you will need to focus on controlling your diabetes during the weeks before surgery. . Make sure that the doctor who takes care of your diabetes knows about your  planned surgery including the date and location.  How do I manage my blood sugar before surgery? . Check your blood sugar at least 4 times a day, starting 2 days before surgery, to make sure that the level is not too high or low. o Check your blood sugar the morning of your surgery when you wake up and every 2 hours until you get to the Short Stay unit. . If your blood sugar is less than 70 mg/dL, you will need to treat for low blood sugar: o Do not take insulin. o Treat a low blood sugar (less than 70 mg/dL) with  cup of clear juice (cranberry or apple), 4 glucose tablets, OR glucose gel. o Recheck blood sugar in 15 minutes after treatment (to make sure it is greater than 70 mg/dL). If your blood sugar is not greater than 70 mg/dL on recheck, call [phone] for further instructions. . Report your blood sugar to the short stay nurse when you get to Short Stay.  . If you are admitted to the hospital after surgery: o Your blood sugar will be checked by the staff and you will probably be given insulin after surgery (instead of oral diabetes medicines) to make sure you have good blood sugar levels. o The goal for blood sugar control after surgery is 80-180 mg/dL.  WHAT DO I DO ABOUT MY DIABETES MEDICATION?   [name] Do not take oral diabetes medicines (pills) the morning of surgery.  .         .   Other Instructions:          Patient Signature:  Date:   Nurse Signature:  Date:   Reviewed and Endorsed by [facility] Patient Education Committee, August 2015Special Instructions: [facility] - Preparing for Surgery  Before surgery, you can play an important role.  Because skin is not sterile, your skin needs to be as free of germs as possible.  You can reduce the number of germs on you skin by washing with CHG (chlorahexidine gluconate) soap before surgery.  CHG is an antiseptic cleaner which kills germs and bonds with the skin to continue killing germs even after  washing.  Please DO NOT use if you have an allergy to CHG or antibacterial soaps.  If your skin becomes reddened/irritated stop using the CHG and inform your nurse when you arrive at Short Stay.  Do not shave (including legs and underarms) for at least 48 hours prior to the first CHG shower.  You may shave your face.  Please follow these instructions carefully:   1.  Shower with CHG Soap the night before surgery and the  morning of Surgery.  2.  If you choose to wash your hair, wash your hair first as usual with your  normal shampoo.  3.  After you shampoo, rinse your hair and body thoroughly to remove the  Shampoo.  4.  Use CHG as you would any other liquid soap.  You can apply chg directly to the skin and wash gently with scrungie or a clean washcloth.  5.  Apply the CHG Soap to your body ONLY FROM THE NECK DOWN.    Do not use on open wounds or open sores.  Avoid contact with your eyes, ears, mouth and genitals (private parts).  Wash genitals (private parts)   with your normal soap.  6.  Wash thoroughly, paying special attention to the area where your surgery will be performed.  7.  Thoroughly rinse your body with warm water from the neck down.  8.  DO NOT shower/wash with your normal soap after using and rinsing off   the CHG Soap.  9.  Pat yourself dry with a clean towel.            10.  Wear clean pajamas.            11.  Place clean sheets on your bed the night of your first shower and do not sleep with pets.  Day of Surgery  Do not apply any lotions/deodorants the morning of surgery.  Please wear clean clothes to the hospital/surgery center.  Please read over the following fact sheets that you were given. Pain Booklet, Coughing and Deep Breathing, Blood Transfusion Information, MRSA Information and Surgical Site Infection Prevention

## 2015-07-01 MED ORDER — DEXTROSE 5 % IV SOLN
1.5000 g | INTRAVENOUS | Status: AC
  Administered 2015-07-04 (×2): 1.5 g via INTRAVENOUS
  Filled 2015-07-01: qty 1.5

## 2015-07-04 ENCOUNTER — Encounter (HOSPITAL_COMMUNITY)
Admission: RE | Disposition: A | Discharge status: Home Health | Payer: Self-pay | Source: Ambulatory Visit | Attending: Thoracic Surgery (Cardiothoracic Vascular Surgery)

## 2015-07-04 ENCOUNTER — Inpatient Hospital Stay (HOSPITAL_COMMUNITY): Payer: Medicare Other

## 2015-07-04 ENCOUNTER — Inpatient Hospital Stay (HOSPITAL_COMMUNITY)
Admission: RE | Admit: 2015-07-04 | Discharge: 2015-07-14 | DRG: 164 | Disposition: A | Discharge status: Home Health | Payer: Medicare Other | Source: Ambulatory Visit | Attending: Thoracic Surgery (Cardiothoracic Vascular Surgery) | Admitting: Thoracic Surgery (Cardiothoracic Vascular Surgery)

## 2015-07-04 ENCOUNTER — Encounter (HOSPITAL_COMMUNITY): Payer: Self-pay | Admitting: Surgery

## 2015-07-04 ENCOUNTER — Inpatient Hospital Stay (HOSPITAL_COMMUNITY): Payer: Medicare Other | Admitting: Anesthesiology

## 2015-07-04 DIAGNOSIS — Z7952 Long term (current) use of systemic steroids: Secondary | ICD-10-CM | POA: Diagnosis not present

## 2015-07-04 DIAGNOSIS — M810 Age-related osteoporosis without current pathological fracture: Secondary | ICD-10-CM | POA: Diagnosis present

## 2015-07-04 DIAGNOSIS — M1990 Unspecified osteoarthritis, unspecified site: Secondary | ICD-10-CM | POA: Diagnosis present

## 2015-07-04 DIAGNOSIS — E669 Obesity, unspecified: Secondary | ICD-10-CM | POA: Diagnosis present

## 2015-07-04 DIAGNOSIS — K760 Fatty (change of) liver, not elsewhere classified: Secondary | ICD-10-CM | POA: Diagnosis present

## 2015-07-04 DIAGNOSIS — Z7951 Long term (current) use of inhaled steroids: Secondary | ICD-10-CM | POA: Diagnosis not present

## 2015-07-04 DIAGNOSIS — F419 Anxiety disorder, unspecified: Secondary | ICD-10-CM | POA: Diagnosis present

## 2015-07-04 DIAGNOSIS — E1142 Type 2 diabetes mellitus with diabetic polyneuropathy: Secondary | ICD-10-CM | POA: Diagnosis not present

## 2015-07-04 DIAGNOSIS — F329 Major depressive disorder, single episode, unspecified: Secondary | ICD-10-CM | POA: Diagnosis present

## 2015-07-04 DIAGNOSIS — Z6830 Body mass index (BMI) 30.0-30.9, adult: Secondary | ICD-10-CM | POA: Diagnosis not present

## 2015-07-04 DIAGNOSIS — Z79899 Other long term (current) drug therapy: Secondary | ICD-10-CM | POA: Diagnosis not present

## 2015-07-04 DIAGNOSIS — K219 Gastro-esophageal reflux disease without esophagitis: Secondary | ICD-10-CM | POA: Diagnosis present

## 2015-07-04 DIAGNOSIS — Z7984 Long term (current) use of oral hypoglycemic drugs: Secondary | ICD-10-CM

## 2015-07-04 DIAGNOSIS — I10 Essential (primary) hypertension: Secondary | ICD-10-CM | POA: Diagnosis present

## 2015-07-04 DIAGNOSIS — J9811 Atelectasis: Secondary | ICD-10-CM | POA: Diagnosis not present

## 2015-07-04 DIAGNOSIS — J449 Chronic obstructive pulmonary disease, unspecified: Secondary | ICD-10-CM | POA: Diagnosis not present

## 2015-07-04 DIAGNOSIS — C3412 Malignant neoplasm of upper lobe, left bronchus or lung: Secondary | ICD-10-CM | POA: Diagnosis present

## 2015-07-04 DIAGNOSIS — Z4682 Encounter for fitting and adjustment of non-vascular catheter: Secondary | ICD-10-CM

## 2015-07-04 DIAGNOSIS — E559 Vitamin D deficiency, unspecified: Secondary | ICD-10-CM | POA: Diagnosis present

## 2015-07-04 DIAGNOSIS — J9382 Other air leak: Secondary | ICD-10-CM | POA: Diagnosis not present

## 2015-07-04 DIAGNOSIS — E119 Type 2 diabetes mellitus without complications: Secondary | ICD-10-CM | POA: Diagnosis present

## 2015-07-04 DIAGNOSIS — J939 Pneumothorax, unspecified: Secondary | ICD-10-CM | POA: Diagnosis not present

## 2015-07-04 DIAGNOSIS — C3411 Malignant neoplasm of upper lobe, right bronchus or lung: Principal | ICD-10-CM | POA: Diagnosis present

## 2015-07-04 DIAGNOSIS — Z79891 Long term (current) use of opiate analgesic: Secondary | ICD-10-CM

## 2015-07-04 DIAGNOSIS — I4891 Unspecified atrial fibrillation: Secondary | ICD-10-CM | POA: Diagnosis not present

## 2015-07-04 DIAGNOSIS — F17200 Nicotine dependence, unspecified, uncomplicated: Secondary | ICD-10-CM | POA: Diagnosis present

## 2015-07-04 DIAGNOSIS — C3490 Malignant neoplasm of unspecified part of unspecified bronchus or lung: Secondary | ICD-10-CM | POA: Diagnosis present

## 2015-07-04 DIAGNOSIS — Z6828 Body mass index (BMI) 28.0-28.9, adult: Secondary | ICD-10-CM | POA: Diagnosis not present

## 2015-07-04 DIAGNOSIS — C3410 Malignant neoplasm of upper lobe, unspecified bronchus or lung: Secondary | ICD-10-CM | POA: Diagnosis not present

## 2015-07-04 DIAGNOSIS — J811 Chronic pulmonary edema: Secondary | ICD-10-CM | POA: Diagnosis not present

## 2015-07-04 DIAGNOSIS — Z574 Occupational exposure to toxic agents in agriculture: Secondary | ICD-10-CM

## 2015-07-04 DIAGNOSIS — E785 Hyperlipidemia, unspecified: Secondary | ICD-10-CM | POA: Diagnosis present

## 2015-07-04 DIAGNOSIS — R339 Retention of urine, unspecified: Secondary | ICD-10-CM | POA: Diagnosis not present

## 2015-07-04 DIAGNOSIS — Z902 Acquired absence of lung [part of]: Secondary | ICD-10-CM

## 2015-07-04 DIAGNOSIS — Z09 Encounter for follow-up examination after completed treatment for conditions other than malignant neoplasm: Secondary | ICD-10-CM

## 2015-07-04 HISTORY — PX: VIDEO ASSISTED THORACOSCOPY (VATS)/ LOBECTOMY: SHX6169

## 2015-07-04 LAB — GLUCOSE, CAPILLARY
Glucose-Capillary: 97 mg/dL (ref 65–99)
Glucose-Capillary: 132 mg/dL — ABNORMAL HIGH (ref 65–99)
Glucose-Capillary: 167 mg/dL — ABNORMAL HIGH (ref 65–99)

## 2015-07-04 SURGERY — VIDEO ASSISTED THORACOSCOPY (VATS)/ LOBECTOMY
Anesthesia: General | Site: Chest | Laterality: Right

## 2015-07-04 MED ORDER — PANTOPRAZOLE SODIUM 40 MG PO TBEC
40.0000 mg | DELAYED_RELEASE_TABLET | Freq: Every day | ORAL | Status: DC
  Administered 2015-07-05 – 2015-07-14 (×10): 40 mg via ORAL
  Filled 2015-07-04 (×10): qty 1

## 2015-07-04 MED ORDER — ONDANSETRON HCL 4 MG/2ML IJ SOLN: 4.0000 mg | Freq: Four times a day (QID) | INTRAMUSCULAR | Status: DC | PRN

## 2015-07-04 MED ORDER — OXYCODONE HCL 5 MG PO TABS: 5.0000 mg | ORAL_TABLET | Freq: Once | ORAL | Status: DC | PRN

## 2015-07-04 MED ORDER — FENTANYL CITRATE (PF) 250 MCG/5ML IJ SOLN
INTRAMUSCULAR | Status: AC
  Filled 2015-07-04: qty 5

## 2015-07-04 MED ORDER — ROCURONIUM BROMIDE 100 MG/10ML IV SOLN
INTRAVENOUS | Status: DC | PRN
  Administered 2015-07-04 (×2): 20 mg via INTRAVENOUS
  Administered 2015-07-04: 10 mg via INTRAVENOUS
  Administered 2015-07-04: 50 mg via INTRAVENOUS

## 2015-07-04 MED ORDER — 0.9 % SODIUM CHLORIDE (POUR BTL) OPTIME
TOPICAL | Status: DC | PRN
  Administered 2015-07-04: 3000 mL

## 2015-07-04 MED ORDER — LIDOCAINE HCL (CARDIAC) 20 MG/ML IV SOLN
INTRAVENOUS | Status: AC
  Filled 2015-07-04: qty 10

## 2015-07-04 MED ORDER — BISACODYL 5 MG PO TBEC
10.0000 mg | DELAYED_RELEASE_TABLET | Freq: Every day | ORAL | Status: DC
  Administered 2015-07-05 – 2015-07-13 (×8): 10 mg via ORAL
  Filled 2015-07-04 (×10): qty 2

## 2015-07-04 MED ORDER — TIOTROPIUM BROMIDE MONOHYDRATE 18 MCG IN CAPS
18.0000 ug | ORAL_CAPSULE | Freq: Every day | RESPIRATORY_TRACT | Status: DC
  Administered 2015-07-06 – 2015-07-12 (×6): 18 ug via RESPIRATORY_TRACT
  Filled 2015-07-04 (×4): qty 5

## 2015-07-04 MED ORDER — LUNG SURGERY BOOK
Freq: Once | Status: AC
  Administered 2015-07-04: 20:00:00
  Filled 2015-07-04: qty 1

## 2015-07-04 MED ORDER — ONDANSETRON HCL 4 MG/2ML IJ SOLN
INTRAMUSCULAR | Status: AC
  Filled 2015-07-04: qty 4

## 2015-07-04 MED ORDER — DEXTROSE 5 % IV SOLN
1.5000 g | Freq: Two times a day (BID) | INTRAVENOUS | Status: AC
  Administered 2015-07-04 – 2015-07-05 (×2): 1.5 g via INTRAVENOUS
  Filled 2015-07-04 (×2): qty 1.5

## 2015-07-04 MED ORDER — IPRATROPIUM-ALBUTEROL 0.5-2.5 (3) MG/3ML IN SOLN: 3.0000 mL | Freq: Three times a day (TID) | RESPIRATORY_TRACT | Status: DC | PRN

## 2015-07-04 MED ORDER — STERILE WATER FOR INJECTION IJ SOLN
INTRAMUSCULAR | Status: AC
  Filled 2015-07-04: qty 10

## 2015-07-04 MED ORDER — ACETAMINOPHEN 160 MG/5ML PO SOLN
1000.0000 mg | Freq: Four times a day (QID) | ORAL | Status: DC
  Filled 2015-07-04: qty 40.6

## 2015-07-04 MED ORDER — ATENOLOL-CHLORTHALIDONE 50-25 MG PO TABS: 1.0000 | ORAL_TABLET | Freq: Every day | ORAL | Status: DC

## 2015-07-04 MED ORDER — ATENOLOL 50 MG PO TABS
50.0000 mg | ORAL_TABLET | Freq: Every day | ORAL | Status: DC
  Administered 2015-07-04 – 2015-07-07 (×4): 50 mg via ORAL
  Filled 2015-07-04 (×4): qty 1

## 2015-07-04 MED ORDER — SUGAMMADEX SODIUM 500 MG/5ML IV SOLN
INTRAVENOUS | Status: AC
  Filled 2015-07-04: qty 5

## 2015-07-04 MED ORDER — PROPOFOL 10 MG/ML IV BOLUS
INTRAVENOUS | Status: AC
  Filled 2015-07-04: qty 20

## 2015-07-04 MED ORDER — ONDANSETRON HCL 4 MG/2ML IJ SOLN
4.0000 mg | Freq: Four times a day (QID) | INTRAMUSCULAR | Status: DC | PRN
  Filled 2015-07-04 (×2): qty 2

## 2015-07-04 MED ORDER — NALOXONE HCL 0.4 MG/ML IJ SOLN: 0.4000 mg | INTRAMUSCULAR | Status: DC | PRN

## 2015-07-04 MED ORDER — FUROSEMIDE 40 MG PO TABS: 40.0000 mg | ORAL_TABLET | Freq: Every day | ORAL | Status: DC | PRN

## 2015-07-04 MED ORDER — LACTATED RINGERS IV SOLN
INTRAVENOUS | Status: DC | PRN
  Administered 2015-07-04 (×2): via INTRAVENOUS

## 2015-07-04 MED ORDER — PROPOFOL 10 MG/ML IV BOLUS
INTRAVENOUS | Status: DC | PRN
  Administered 2015-07-04: 200 mg via INTRAVENOUS

## 2015-07-04 MED ORDER — FENTANYL 40 MCG/ML IV SOLN
INTRAVENOUS | Status: AC
  Filled 2015-07-04: qty 25

## 2015-07-04 MED ORDER — SENNOSIDES-DOCUSATE SODIUM 8.6-50 MG PO TABS
1.0000 | ORAL_TABLET | ORAL | Status: DC
  Administered 2015-07-06 – 2015-07-10 (×3): 1 via ORAL
  Filled 2015-07-04 (×7): qty 1

## 2015-07-04 MED ORDER — ONDANSETRON HCL 4 MG/2ML IJ SOLN
INTRAMUSCULAR | Status: DC | PRN
  Administered 2015-07-04: 4 mg via INTRAVENOUS

## 2015-07-04 MED ORDER — MECLIZINE HCL 25 MG PO TABS
50.0000 mg | ORAL_TABLET | Freq: Two times a day (BID) | ORAL | Status: DC | PRN
  Filled 2015-07-04: qty 2

## 2015-07-04 MED ORDER — OXYCODONE HCL 5 MG/5ML PO SOLN: 5.0000 mg | Freq: Once | ORAL | Status: DC | PRN

## 2015-07-04 MED ORDER — HYDROMORPHONE HCL 1 MG/ML IJ SOLN
0.2500 mg | INTRAMUSCULAR | Status: DC | PRN
  Administered 2015-07-04 (×2): 0.5 mg via INTRAVENOUS

## 2015-07-04 MED ORDER — HYDROMORPHONE HCL 1 MG/ML IJ SOLN
INTRAMUSCULAR | Status: AC
  Filled 2015-07-04: qty 1

## 2015-07-04 MED ORDER — EPHEDRINE SULFATE 50 MG/ML IJ SOLN
INTRAMUSCULAR | Status: AC
  Filled 2015-07-04: qty 1

## 2015-07-04 MED ORDER — ENOXAPARIN SODIUM 40 MG/0.4ML ~~LOC~~ SOLN
40.0000 mg | Freq: Every day | SUBCUTANEOUS | Status: DC
  Administered 2015-07-05 – 2015-07-13 (×9): 40 mg via SUBCUTANEOUS
  Filled 2015-07-04 (×10): qty 0.4

## 2015-07-04 MED ORDER — DEXTROSE IN LACTATED RINGERS 5 % IV SOLN
INTRAVENOUS | Status: DC
  Administered 2015-07-04: 100 mL/h via INTRAVENOUS
  Administered 2015-07-05 – 2015-07-06 (×2): via INTRAVENOUS

## 2015-07-04 MED ORDER — NICOTINE POLACRILEX 2 MG MT GUM
4.0000 mg | CHEWING_GUM | OROMUCOSAL | Status: DC | PRN
  Filled 2015-07-04 (×2): qty 2

## 2015-07-04 MED ORDER — OXYCODONE HCL 5 MG PO TABS
5.0000 mg | ORAL_TABLET | ORAL | Status: DC | PRN
  Administered 2015-07-04 – 2015-07-05 (×4): 5 mg via ORAL
  Administered 2015-07-07 – 2015-07-10 (×5): 10 mg via ORAL
  Administered 2015-07-10: 5 mg via ORAL
  Administered 2015-07-10: 10 mg via ORAL
  Filled 2015-07-04: qty 2
  Filled 2015-07-04: qty 1
  Filled 2015-07-04: qty 2
  Filled 2015-07-04 (×3): qty 1
  Filled 2015-07-04 (×3): qty 2
  Filled 2015-07-04: qty 1
  Filled 2015-07-04 (×2): qty 2

## 2015-07-04 MED ORDER — INSULIN ASPART 100 UNIT/ML ~~LOC~~ SOLN
0.0000 [IU] | Freq: Four times a day (QID) | SUBCUTANEOUS | Status: DC
  Administered 2015-07-04: 4 [IU] via SUBCUTANEOUS

## 2015-07-04 MED ORDER — CHLORTHALIDONE 25 MG PO TABS
25.0000 mg | ORAL_TABLET | Freq: Every day | ORAL | Status: DC
  Administered 2015-07-04 – 2015-07-07 (×3): 25 mg via ORAL
  Filled 2015-07-04 (×4): qty 1

## 2015-07-04 MED ORDER — VECURONIUM BROMIDE 10 MG IV SOLR
INTRAVENOUS | Status: DC | PRN
  Administered 2015-07-04: 2 mg via INTRAVENOUS
  Administered 2015-07-04 (×2): 1 mg via INTRAVENOUS
  Administered 2015-07-04: 2 mg via INTRAVENOUS

## 2015-07-04 MED ORDER — PHENYLEPHRINE HCL 10 MG/ML IJ SOLN
INTRAMUSCULAR | Status: DC | PRN
  Administered 2015-07-04: 40 ug via INTRAVENOUS
  Administered 2015-07-04: 80 ug via INTRAVENOUS

## 2015-07-04 MED ORDER — VECURONIUM BROMIDE 10 MG IV SOLR
INTRAVENOUS | Status: AC
  Filled 2015-07-04: qty 10

## 2015-07-04 MED ORDER — SILDENAFIL CITRATE 100 MG PO TABS: 100.0000 mg | ORAL_TABLET | Freq: Every day | ORAL | Status: DC | PRN

## 2015-07-04 MED ORDER — FENTANYL CITRATE (PF) 100 MCG/2ML IJ SOLN
INTRAMUSCULAR | Status: DC | PRN
  Administered 2015-07-04 (×3): 100 ug via INTRAVENOUS
  Administered 2015-07-04: 50 ug via INTRAVENOUS

## 2015-07-04 MED ORDER — SUGAMMADEX SODIUM 500 MG/5ML IV SOLN
INTRAVENOUS | Status: DC | PRN
  Administered 2015-07-04: 493.2 mg via INTRAVENOUS

## 2015-07-04 MED ORDER — ARTIFICIAL TEARS OP OINT
TOPICAL_OINTMENT | OPHTHALMIC | Status: DC | PRN
  Administered 2015-07-04: 1 via OPHTHALMIC

## 2015-07-04 MED ORDER — SUCCINYLCHOLINE CHLORIDE 20 MG/ML IJ SOLN
INTRAMUSCULAR | Status: AC
  Filled 2015-07-04: qty 1

## 2015-07-04 MED ORDER — ALBUTEROL SULFATE (2.5 MG/3ML) 0.083% IN NEBU: 2.5000 mg | INHALATION_SOLUTION | Freq: Four times a day (QID) | RESPIRATORY_TRACT | Status: DC | PRN

## 2015-07-04 MED ORDER — GABAPENTIN 400 MG PO CAPS
400.0000 mg | ORAL_CAPSULE | Freq: Two times a day (BID) | ORAL | Status: DC
  Filled 2015-07-04 (×5): qty 1

## 2015-07-04 MED ORDER — METOCLOPRAMIDE HCL 5 MG PO TABS
5.0000 mg | ORAL_TABLET | Freq: Two times a day (BID) | ORAL | Status: DC
  Administered 2015-07-04 – 2015-07-14 (×19): 5 mg via ORAL
  Filled 2015-07-04 (×20): qty 1

## 2015-07-04 MED ORDER — NITROGLYCERIN 0.4 MG SL SUBL: 0.4000 mg | SUBLINGUAL_TABLET | SUBLINGUAL | Status: DC | PRN

## 2015-07-04 MED ORDER — LIDOCAINE HCL (CARDIAC) 20 MG/ML IV SOLN
INTRAVENOUS | Status: DC | PRN
  Administered 2015-07-04: 75 mg via INTRAVENOUS

## 2015-07-04 MED ORDER — SODIUM CHLORIDE 0.9 % IJ SOLN
INTRAMUSCULAR | Status: AC
  Filled 2015-07-04: qty 10

## 2015-07-04 MED ORDER — MIDAZOLAM HCL 5 MG/5ML IJ SOLN
INTRAMUSCULAR | Status: DC | PRN
  Administered 2015-07-04: 2 mg via INTRAVENOUS

## 2015-07-04 MED ORDER — BUPIVACAINE HCL (PF) 0.5 % IJ SOLN
INTRAMUSCULAR | Status: DC | PRN
  Administered 2015-07-04: 10 mL

## 2015-07-04 MED ORDER — MUPIROCIN CALCIUM 2 % EX CREA
1.0000 "application " | TOPICAL_CREAM | Freq: Every day | CUTANEOUS | Status: DC | PRN
  Administered 2015-07-11: 1 via TOPICAL
  Filled 2015-07-04 (×2): qty 15

## 2015-07-04 MED ORDER — BUPIVACAINE 0.5 % ON-Q PUMP SINGLE CATH 400 ML
400.0000 mL | INJECTION | Status: DC
  Administered 2015-07-04: 400 mL
  Filled 2015-07-04: qty 400

## 2015-07-04 MED ORDER — PHENYLEPHRINE HCL 10 MG/ML IJ SOLN
10.0000 mg | INTRAMUSCULAR | Status: DC | PRN
  Administered 2015-07-04 (×2): 25 ug/min via INTRAVENOUS

## 2015-07-04 MED ORDER — ACETAMINOPHEN 500 MG PO TABS
1000.0000 mg | ORAL_TABLET | Freq: Four times a day (QID) | ORAL | Status: DC
  Administered 2015-07-04 – 2015-07-07 (×10): 1000 mg via ORAL
  Filled 2015-07-04 (×10): qty 2

## 2015-07-04 MED ORDER — DIPHENHYDRAMINE HCL 12.5 MG/5ML PO ELIX: 12.5000 mg | ORAL_SOLUTION | Freq: Four times a day (QID) | ORAL | Status: DC | PRN

## 2015-07-04 MED ORDER — FENTANYL 40 MCG/ML IV SOLN
INTRAVENOUS | Status: DC
  Administered 2015-07-04: 12:00:00 via INTRAVENOUS
  Administered 2015-07-04 (×3): 45 ug via INTRAVENOUS
  Administered 2015-07-05: 30 ug via INTRAVENOUS
  Administered 2015-07-05: 15 ug via INTRAVENOUS
  Administered 2015-07-05: 45 ug via INTRAVENOUS
  Administered 2015-07-06: 105 ug via INTRAVENOUS
  Administered 2015-07-06: 30 ug via INTRAVENOUS
  Administered 2015-07-06 – 2015-07-07 (×2): 15 ug via INTRAVENOUS
  Administered 2015-07-08: 0 ug via INTRAVENOUS

## 2015-07-04 MED ORDER — DIPHENHYDRAMINE HCL 50 MG/ML IJ SOLN
12.5000 mg | Freq: Four times a day (QID) | INTRAMUSCULAR | Status: DC | PRN
  Administered 2015-07-05: 12.5 mg via INTRAVENOUS
  Filled 2015-07-04: qty 1

## 2015-07-04 MED ORDER — TAMSULOSIN HCL 0.4 MG PO CAPS
0.4000 mg | ORAL_CAPSULE | Freq: Every day | ORAL | Status: DC
  Administered 2015-07-05 – 2015-07-14 (×10): 0.4 mg via ORAL
  Filled 2015-07-04 (×10): qty 1

## 2015-07-04 MED ORDER — BUPIVACAINE HCL (PF) 0.5 % IJ SOLN
INTRAMUSCULAR | Status: AC
  Filled 2015-07-04: qty 10

## 2015-07-04 MED ORDER — POTASSIUM CHLORIDE 10 MEQ/50ML IV SOLN
10.0000 meq | Freq: Every day | INTRAVENOUS | Status: DC | PRN
  Administered 2015-07-05 (×2): 10 meq via INTRAVENOUS
  Filled 2015-07-04 (×3): qty 50

## 2015-07-04 MED ORDER — ROCURONIUM BROMIDE 50 MG/5ML IV SOLN
INTRAVENOUS | Status: AC
  Filled 2015-07-04: qty 4

## 2015-07-04 MED ORDER — MIDAZOLAM HCL 2 MG/2ML IJ SOLN
INTRAMUSCULAR | Status: AC
  Filled 2015-07-04: qty 2

## 2015-07-04 MED ORDER — ONDANSETRON HCL 4 MG/2ML IJ SOLN
4.0000 mg | Freq: Four times a day (QID) | INTRAMUSCULAR | Status: DC | PRN
  Administered 2015-07-04 – 2015-07-05 (×3): 4 mg via INTRAVENOUS
  Filled 2015-07-04: qty 2

## 2015-07-04 MED ORDER — BUPIVACAINE ON-Q PAIN PUMP (FOR ORDER SET NO CHG)
INJECTION | Status: AC
  Filled 2015-07-04: qty 1

## 2015-07-04 MED ORDER — SODIUM CHLORIDE 0.9% FLUSH: 9.0000 mL | INTRAVENOUS | Status: DC | PRN

## 2015-07-04 MED ORDER — HYDROMORPHONE HCL 1 MG/ML IJ SOLN
0.2500 mg | INTRAMUSCULAR | Status: DC | PRN
  Administered 2015-07-04 (×4): 0.5 mg via INTRAVENOUS

## 2015-07-04 MED ORDER — ARTIFICIAL TEARS OP OINT
TOPICAL_OINTMENT | OPHTHALMIC | Status: AC
  Filled 2015-07-04: qty 7

## 2015-07-04 MED ORDER — POTASSIUM CHLORIDE ER 10 MEQ PO TBCR
10.0000 meq | EXTENDED_RELEASE_TABLET | Freq: Every day | ORAL | Status: DC
  Administered 2015-07-05 – 2015-07-14 (×9): 10 meq via ORAL
  Filled 2015-07-04 (×20): qty 1

## 2015-07-04 SURGICAL SUPPLY — 102 items
ADH SKN CLS APL DERMABOND .7 (GAUZE/BANDAGES/DRESSINGS) ×1
APPLIER CLIP ROT 10 11.4 M/L (STAPLE)
APR CLP MED LRG 11.4X10 (STAPLE)
BAG SPEC RTRVL LRG 6X4 10 (ENDOMECHANICALS)
CANISTER SUCTION 2500CC (MISCELLANEOUS) ×3 IMPLANT
CATH KIT ON Q 5IN SLV (PAIN MANAGEMENT) IMPLANT
CATH KIT ON-Q SILVERSOAK 5 (CATHETERS) IMPLANT
CATH KIT ON-Q SILVERSOAK 5IN (CATHETERS) ×3 IMPLANT
CATH THORACIC 28FR (CATHETERS) ×2 IMPLANT
CATH THORACIC 28FR RT ANG (CATHETERS) IMPLANT
CATH THORACIC 36FR (CATHETERS) IMPLANT
CATH THORACIC 36FR RT ANG (CATHETERS) IMPLANT
CLIP APPLIE ROT 10 11.4 M/L (STAPLE) IMPLANT
CLIP TI MEDIUM 6 (CLIP) ×3 IMPLANT
CONN ST 1/4X3/8  BEN (MISCELLANEOUS) ×2
CONN ST 1/4X3/8 BEN (MISCELLANEOUS) IMPLANT
CONN Y 3/8X3/8X3/8  BEN (MISCELLANEOUS) ×2
CONN Y 3/8X3/8X3/8 BEN (MISCELLANEOUS) IMPLANT
CONT SPEC 4OZ CLIKSEAL STRL BL (MISCELLANEOUS) ×21 IMPLANT
COUNTER NEEDLE 20 DBL MAG RED (NEEDLE) ×2 IMPLANT
COVER SURGICAL LIGHT HANDLE (MISCELLANEOUS) IMPLANT
CUTTER ECHEON FLEX ENDO 45 340 (ENDOMECHANICALS) ×2 IMPLANT
DERMABOND ADVANCED (GAUZE/BANDAGES/DRESSINGS) ×2
DERMABOND ADVANCED .7 DNX12 (GAUZE/BANDAGES/DRESSINGS) ×1 IMPLANT
DRAPE LAPAROSCOPIC ABDOMINAL (DRAPES) ×3 IMPLANT
DRAPE WARM FLUID 44X44 (DRAPE) ×3 IMPLANT
ELECT BLADE 6.5 EXT (BLADE) ×3 IMPLANT
ELECT REM PT RETURN 9FT ADLT (ELECTROSURGICAL) ×3
ELECTRODE REM PT RTRN 9FT ADLT (ELECTROSURGICAL) ×1 IMPLANT
GAUZE SPONGE 2X2 8PLY STRL LF (GAUZE/BANDAGES/DRESSINGS) IMPLANT
GAUZE SPONGE 4X4 12PLY STRL (GAUZE/BANDAGES/DRESSINGS) ×3 IMPLANT
GLOVE BIO SURGEON STRL SZ 6.5 (GLOVE) ×1 IMPLANT
GLOVE BIO SURGEONS STRL SZ 6.5 (GLOVE) ×1
GLOVE BIOGEL PI IND STRL 6.5 (GLOVE) IMPLANT
GLOVE BIOGEL PI INDICATOR 6.5 (GLOVE) ×8
GLOVE SS N UNI LF 6.5 STRL (GLOVE) ×2 IMPLANT
GLOVE SURG SIGNA 7.5 PF LTX (GLOVE) ×8 IMPLANT
GLOVE SURG SS PI 7.0 STRL IVOR (GLOVE) ×3 IMPLANT
GOWN SPEC L3 XXLG W/TWL (GOWN DISPOSABLE) ×2 IMPLANT
GOWN STRL REUS W/ TWL LRG LVL3 (GOWN DISPOSABLE) ×2 IMPLANT
GOWN STRL REUS W/ TWL XL LVL3 (GOWN DISPOSABLE) ×1 IMPLANT
GOWN STRL REUS W/TWL LRG LVL3 (GOWN DISPOSABLE) ×9
GOWN STRL REUS W/TWL XL LVL3 (GOWN DISPOSABLE) ×6
HEMOSTAT SURGICEL 2X14 (HEMOSTASIS) IMPLANT
KIT BASIN OR (CUSTOM PROCEDURE TRAY) ×3 IMPLANT
KIT ROOM TURNOVER OR (KITS) ×3 IMPLANT
KIT SUCTION CATH 14FR (SUCTIONS) IMPLANT
NS IRRIG 1000ML POUR BTL (IV SOLUTION) ×9 IMPLANT
PACK CHEST (CUSTOM PROCEDURE TRAY) ×3 IMPLANT
PAD ARMBOARD 7.5X6 YLW CONV (MISCELLANEOUS) ×6 IMPLANT
POUCH ENDO CATCH II 15MM (MISCELLANEOUS) ×4 IMPLANT
POUCH SPECIMEN RETRIEVAL 10MM (ENDOMECHANICALS) IMPLANT
RELOAD GOLD ECHELON 45 (STAPLE) ×14 IMPLANT
RELOAD GREEN ECHELON 45 (STAPLE) ×2 IMPLANT
RELOAD STAPLE 35X2.5 WHT THIN (STAPLE) IMPLANT
SCISSORS ENDO CVD 5DCS (MISCELLANEOUS) IMPLANT
SEALANT PROGEL (MISCELLANEOUS) IMPLANT
SEALANT SURG COSEAL 4ML (VASCULAR PRODUCTS) IMPLANT
SEALANT SURG COSEAL 8ML (VASCULAR PRODUCTS) IMPLANT
SHEARS HARMONIC HDI 20CM (ELECTROSURGICAL) ×2 IMPLANT
SOLUTION ANTI FOG 6CC (MISCELLANEOUS) ×3 IMPLANT
SPECIMEN JAR MEDIUM (MISCELLANEOUS) IMPLANT
SPONGE GAUZE 2X2 STER 10/PKG (GAUZE/BANDAGES/DRESSINGS) ×4
SPONGE INTESTINAL PEANUT (DISPOSABLE) ×16 IMPLANT
SPONGE TONSIL 1 RF SGL (DISPOSABLE) ×3 IMPLANT
STAPLE RELOAD 2.5MM WHITE (STAPLE) ×15 IMPLANT
STAPLER VASCULAR ECHELON 35 (CUTTER) ×2 IMPLANT
SUT PROLENE 4 0 RB 1 (SUTURE)
SUT PROLENE 4-0 RB1 .5 CRCL 36 (SUTURE) IMPLANT
SUT SILK  1 MH (SUTURE) ×4
SUT SILK 1 MH (SUTURE) ×2 IMPLANT
SUT SILK 1 TIES 10X30 (SUTURE) ×3 IMPLANT
SUT SILK 2 0 SH (SUTURE) IMPLANT
SUT SILK 2 0SH CR/8 30 (SUTURE) IMPLANT
SUT SILK 3 0 SH 30 (SUTURE) ×2 IMPLANT
SUT SILK 3 0SH CR/8 30 (SUTURE) IMPLANT
SUT VIC AB 1 CTX 36 (SUTURE) ×3
SUT VIC AB 1 CTX36XBRD ANBCTR (SUTURE) ×1 IMPLANT
SUT VIC AB 2-0 CT1 27 (SUTURE) ×3
SUT VIC AB 2-0 CT1 TAPERPNT 27 (SUTURE) IMPLANT
SUT VIC AB 2-0 CTX 36 (SUTURE) ×1 IMPLANT
SUT VIC AB 2-0 UR6 27 (SUTURE) IMPLANT
SUT VIC AB 3-0 MH 27 (SUTURE) ×4 IMPLANT
SUT VIC AB 3-0 SH 27 (SUTURE) ×6
SUT VIC AB 3-0 SH 27X BRD (SUTURE) IMPLANT
SUT VIC AB 3-0 X1 27 (SUTURE) ×3 IMPLANT
SUT VICRYL 2 TP 1 (SUTURE) IMPLANT
SWAB COLLECTION DEVICE MRSA (MISCELLANEOUS) IMPLANT
SYSTEM SAHARA CHEST DRAIN ATS (WOUND CARE) ×3 IMPLANT
TAPE CLOTH 4X10 WHT NS (GAUZE/BANDAGES/DRESSINGS) ×3 IMPLANT
TAPE CLOTH SURG 4X10 WHT LF (GAUZE/BANDAGES/DRESSINGS) ×4 IMPLANT
TIP APPLICATOR SPRAY EXTEND 16 (VASCULAR PRODUCTS) IMPLANT
TOWEL OR 17X24 6PK STRL BLUE (TOWEL DISPOSABLE) ×3 IMPLANT
TOWEL OR 17X26 10 PK STRL BLUE (TOWEL DISPOSABLE) ×3 IMPLANT
TRAP SPECIMEN MUCOUS 40CC (MISCELLANEOUS) IMPLANT
TRAY FOLEY CATH 16FRSI W/METER (SET/KITS/TRAYS/PACK) ×3 IMPLANT
TROCAR BLADELESS 5MM (ENDOMECHANICALS) ×2 IMPLANT
TROCAR XCEL BLADELESS 5X75MML (TROCAR) ×3 IMPLANT
TROCAR XCEL NON-BLD 5MMX100MML (ENDOMECHANICALS) ×2 IMPLANT
TUBE ANAEROBIC SPECIMEN COL (MISCELLANEOUS) IMPLANT
TUNNELER SHEATH ON-Q 11GX8 DSP (PAIN MANAGEMENT) ×2 IMPLANT
WATER STERILE IRR 1000ML POUR (IV SOLUTION) ×3 IMPLANT

## 2015-07-04 NOTE — Addendum Note (Signed)
Addendum  created 07/04/15 9747 by Neldon Newport, CRNA   Modules edited: Anesthesia Medication Administration

## 2015-07-04 NOTE — Anesthesia Procedure Notes (Signed)
Procedure Name: Intubation Date/Time: 07/04/2015 7:50 AM Performed by: Neldon Newport Pre-anesthesia Checklist: Patient being monitored, Suction available, Emergency Drugs available, Patient identified and Timeout performed Patient Re-evaluated:Patient Re-evaluated prior to inductionOxygen Delivery Method: Circle system utilized Preoxygenation: Pre-oxygenation with 100% oxygen Intubation Type: IV induction Ventilation: Mask ventilation without difficulty Laryngoscope Size: Mac and 4 Grade View: Grade I Tube type: Oral Endobronchial tube: Left, Double lumen EBT, EBT position confirmed by auscultation and EBT position confirmed by fiberoptic bronchoscope and 41 Fr Number of attempts: 1 Placement Confirmation: positive ETCO2,  ETT inserted through vocal cords under direct vision and breath sounds checked- equal and bilateral Tube secured with: Tape Dental Injury: Teeth and Oropharynx as per pre-operative assessment

## 2015-07-04 NOTE — Brief Op Note (Signed)
07/04/2015  12:12 PM  PATIENT:  Jonathan [name]  75 y.o. male  PRE-OPERATIVE DIAGNOSIS:  BILATERAL UPPER LOBE CARCINOMA  POST-OPERATIVE DIAGNOSIS:  BILATERAL UPPER LOBE CARCINOMA  PROCEDURE:   RIGHT VIDEO ASSISTED THORACOSCOPY THORACOSCOPIC RIGHT UPPER LOBECTOMY MEDIASTINAL LYMPH NODE DISSECTION ON-Q LOCAL ANESTHETIC CATHETER PLACEMENT  SURGEON:  Surgeon(s) and Role:    * Melrose Nakayama, MD - Primary   ASSISTANTS: Page Spiro, RNFA    ANESTHESIA:   general  EBL:  Total I/O In: 2000 [I.V.:2000] Out: 750 [Urine:550; Blood:200]  BLOOD ADMINISTERED:none  DRAINS: 2 Chest Tube(s) in the right chest   LOCAL MEDICATIONS USED:  MARCAINE    and Amount: 5 ml  SPECIMEN:  Source of Specimen:  RUL, lymph nodes  DISPOSITION OF SPECIMEN:  PATHOLOGY  COUNTS:  YES  PLAN OF CARE: Admit to inpatient   PATIENT DISPOSITION:  PACU - hemodynamically stable.   Delay start of Pharmacological VTE agent (>24hrs) due to surgical blood loss or risk of bleeding: no

## 2015-07-04 NOTE — Interval H&P Note (Signed)
History and Physical Interval Note:  07/04/2015 7:20 AM  Jonathan [name]  has presented today for surgery, with the diagnosis of BILATERAL UPPER LOBE CARCINOMA  The various methods of treatment have been discussed with the patient and family. After consideration of risks, benefits and other options for treatment, the patient has consented to  Procedure(s): VIDEO ASSISTED THORACOSCOPY (VATS)/ RIGHT UPPER LOBECTOMY (Right) as a surgical intervention .  The patient's history has been reviewed, patient examined, no change in status, stable for surgery.  I have reviewed the patient's chart and labs.  Questions were answered to the patient's satisfaction.     Melrose Nakayama

## 2015-07-04 NOTE — H&P (View-Only) (Signed)
[address]       [place] [address]             [phone]       HPI: Jonathan [name] returns today to discuss the results of his navigational bronchoscopy and endobronchial ultrasound.  Jonathan [name] is a 75 year old man with past medical history significant for tobacco abuse (50+ pack years), COPD, type 2 diabetes, hypertension, hyperlipidemia, gastroesophageal reflux, abdominal aortic aneurysm, neuropathy (secondary to agent orange exposure), arthritis, recent right ankle replacement, and chronic kidney disease. The details are murky but he has been followed for a right upper lobe nodule. He recently had a CT scan that showed a right upper lobe nodule had increased in size and there were branching tubular densities around it. There also was a new cavitary lesion in the left upper lobe. He saw Dr. Jacquiline Doe. A PET CT showed both lesions were markedly hypermetabolic, but there were no other worrisome findings. Specifically there was no hilar or mediastinal adenopathy.  Jonathan [name] denies any history of cardiac disease. He smoked until November 2016. He smoked about a pack a day for over 50 years. He had a right ankle replacement in January of this year and his activities have been limited since then. He is currently walking with a boot. He gets short of breath with heavy exertion but has not noticed any change in that recently. He denies any chest pain, pressure, or tightness. He's lost about 5 pounds over the past 3 months and 10 pounds over the past 6 months. He has an occasional cough, but denies wheezing or hemoptysis.  I did a navigational bronchoscopy on 06/17/2015 and biopsied both upper lobe nodules. I also did endobronchial ultrasound to sample the mediastinal lymph nodes. Both the upper lobe nodules were positive for squamous cell carcinoma. Different instruments were used to biopsy each of the upper lobe nodules. The mediastinal lymph nodes were negative although the  samples were suboptimal.  Past Medical History  Diagnosis Date  . Nicotine addiction   . Obesity   . Dyslipidemia   . Hypertension   . Elevated WBCs September 2011    and UTI Hospitalized   . Erectile dysfunction   . Arthritis   . Polyneuropathy in other diseases classified elsewhere ([place]) 12/05/2012  . Lesion of ulnar nerve 12/05/2012    Right side  . Gait disorder   . COPD (chronic obstructive pulmonary disease) ([place])     normally have breathing tx prior to surgery  . Pneumonia     hx  . History of kidney stones   . GERD (gastroesophageal reflux disease)   . Neuropathy ([place])   . Vertigo   . Wears dentures     bottom  . Fatty liver   . Vitamin D deficiency   . Osteoarthritis   . Lumbar spinal stenosis   . Gastritis   . Hiatal hernia   . History of colon polyps   . Shortness of breath dyspnea   . Anxiety   . Depression   . Emphysema of lung ([place])   . Diabetes mellitus without complication ([place])     borderline no med, controlled with diet  . Osteoporosis   . Abdominal aortic aneurysm ([place])     pt denies-  . History of kidney stones   . CKD (chronic kidney disease)     pt denies     Current Outpatient Prescriptions  Medication Sig Dispense Refill  . albuterol (PROVENTIL HFA;VENTOLIN  HFA) 108 (90 BASE) MCG/ACT inhaler Inhale 1 puff into the lungs every 6 (six) hours as needed for wheezing or shortness of breath.    [name] atenolol-chlorthalidone (TENORETIC) 50-25 MG per tablet Take 1 tablet by mouth at bedtime.     [name] esomeprazole (NEXIUM) 40 MG capsule Take 40 mg by mouth daily.     . furosemide (LASIX) 40 MG tablet Take 40 mg by mouth daily as needed for fluid.    [name] gabapentin (NEURONTIN) 400 MG capsule Take 400 mg by mouth 2 (two) times daily.     [name] ipratropium-albuterol (DUONEB) 0.5-2.5 (3) MG/3ML SOLN Take 3 mLs by nebulization 3 (three) times daily as needed (wheezing).     . meclizine (ANTIVERT) 50 MG tablet Take 50 mg by mouth 2 (two) times daily as needed for  dizziness.     . metFORMIN (GLUCOPHAGE) 500 MG tablet Take 250 mg by mouth daily with breakfast.     . metoCLOPramide (REGLAN) 5 MG tablet Take 5 mg by mouth 4 (four) times daily.    . Misc Natural Products (COLON CLEANSE PO) Take 1 capsule by mouth daily.    . mupirocin cream (BACTROBAN) 2 % Apply 1 application topically daily as needed (Rash).    . nicotine polacrilex (NICORETTE) 4 MG gum Take 4 mg by mouth as needed for smoking cessation.    . nitroGLYCERIN (NITROSTAT) 0.4 MG SL tablet Place 0.4 mg under the tongue every 5 (five) minutes as needed for chest pain.    . Oxycodone HCl 10 MG TABS Take 10 mg by mouth 3 (three) times daily as needed.    . potassium chloride (K-DUR) 10 MEQ tablet Take 10 mEq by mouth daily.    . predniSONE (DELTASONE) 10 MG tablet Take 20 mg by mouth daily as needed. For gout per patient    . Probiotic Product (PROBIOTIC DAILY PO) Take 1 capsule by mouth daily as needed.    . sennosides-docusate sodium (SENOKOT-S) 8.6-50 MG tablet Take 1 tablet by mouth every other day.    . sildenafil (VIAGRA) 100 MG tablet Take 100 mg by mouth daily as needed for erectile dysfunction. One tablet 30 minutes before intercourse, maximum twice weekly    . tamsulosin (FLOMAX) 0.4 MG CAPS capsule Take 0.4 mg by mouth daily as needed. For bladder per patient    . tiotropium (SPIRIVA) 18 MCG inhalation capsule Place 18 mcg into inhaler and inhale daily as needed (Shortness of breath).      No current facility-administered medications for this visit.   Facility-Administered Medications Ordered in Other Visits  Medication Dose Route Frequency Provider Last Rate Last Dose  . lactated ringers infusion    Continuous PRN Kerby Less, CRNA 0 mL/hr at 05/15/13 0750      Physical Exam BP 125/76 mmHg  Pulse 82  Resp 16  Ht '6\' 8"'$  (2.032 m)  Wt 259 lb (117.482 kg)  BMI 28.45 kg/m2  SpO24 80% 75 year old man in no acute distress Alert and oriented 3 with no focal motor deficits,  Decreased sensation ulnar distribution right arm No cervical or subclavicular adenopathy Cardiac regular rate and rhythm normal S1 and S2 Lungs clear with equal breath sounds bilaterally Abdomen soft and nontender Extremities- right leg in boot  Diagnostic Tests: I again reviewed his CT and PET CT findings are as noted in previous notes. He has bilateral upper lobe nodules that are hypermetabolic by PET.  Pathology reviewed it showed squamous cell carcinoma in both upper lobe  nodules.  Impression: 75 year old man with a history of tobacco abuse and agent orange exposure who has bilateral upper lobe squamous cell carcinomas. The right upper lobe nodule was present prior to the appearance of the left upper lobe nodule. This may be slightly metachronous primaries with a left upper lobe nodule could represent metastatic stage IV disease. I do think we she give him the benefit of the doubt and consider it separate primaries.  He had questions as to the cause of his lung cancer. The primary cause is likely tobacco abuse. Agent orange exposure is more likely than not to be a contributing factor as well. I did review his veterans file and service medical records.  I recommended that we began with a right VATS and right upper lobectomy with node dissection for treatment of the right upper lobe squamous cell carcinoma. After seeing how he does we can consider surgery or stereotactic radiation for the left upper lobe nodule. I described the proposed operation to him in detail. He understands the general nature of the procedure, the use of general anesthesia, the incisions to be used, the expected hospital stay, and overall recovery. He does understand we used chest tubes postoperatively. I reviewed the indications, risks, benefits, and alternatives. He understands the risk include, but are not limited to death, MI, DVT, PE, bleeding, possible need for transfusion, infection, prolonged air leak, cardiac  arrhythmias, as well as the possibility of other unforeseeable complications.  He understands and accepts the risks. He understands the issues involved with decision making regarding treatment with surgery versus chemotherapy and/or radiation. He strongly desires to proceed with surgery.  Plan: Right VATS, right upper lobectomy, lymph node dissection on Monday, 07/04/2015  Melrose Nakayama, MD [facility] [phone]

## 2015-07-04 NOTE — Anesthesia Preprocedure Evaluation (Addendum)
Anesthesia Evaluation  Patient identified by MRN, date of birth, ID band Patient awake    Reviewed: Allergy & Precautions, NPO status , Patient's Chart, lab work & pertinent test results  Airway Mallampati: I  TM Distance: >3 FB Neck ROM: full    Dental  (+) Dental Advidsory Given, Missing, Loose, Poor Dentition   Pulmonary shortness of breath and with exertion, COPD, former smoker,    breath sounds clear to auscultation       Cardiovascular hypertension, On Medications + Peripheral Vascular Disease   Rhythm:regular Rate:Normal     Neuro/Psych PSYCHIATRIC DISORDERS Anxiety Depression  Neuromuscular disease    GI/Hepatic hiatal hernia, GERD  Medicated and Controlled,  Endo/Other  diabetes, Type 2obese  Renal/GU      Musculoskeletal  (+) Arthritis ,   Abdominal   Peds  Hematology   Anesthesia Other Findings Study Conclusions  - Left ventricle: The cavity size was normal. Systolic function was normal. The estimated ejection fraction was in the range of 55% to 60%. Wall motion was normal; there were no regional wall motion abnormalities. Doppler parameters are consistent with abnormal left ventricular relaxation (grade 1 diastolic dysfunction). - Mitral valve: Mild regurgitation. - Right ventricle: The cavity size was mildly dilated. Wall thickness was normal.  Reproductive/Obstetrics                           Anesthesia Physical Anesthesia Plan  ASA: III  Anesthesia Plan: General   Post-op Pain Management:    Induction: Intravenous  Airway Management Planned: Double Lumen EBT  Additional Equipment: Arterial line  Intra-op Plan:   Post-operative Plan: Extubation in OR  Informed Consent: I have reviewed the patients History and Physical, chart, labs and discussed the procedure including the risks, benefits and alternatives for the proposed anesthesia with the patient  or authorized representative who has indicated his/her understanding and acceptance.   Dental Advisory Given  Plan Discussed with: Anesthesiologist, CRNA and Surgeon  Anesthesia Plan Comments:       Anesthesia Quick Evaluation

## 2015-07-04 NOTE — Transfer of Care (Signed)
Immediate Anesthesia Transfer of Care Note  Patient: Jonathan [name]  Procedure(s) Performed: Procedure(s): VIDEO ASSISTED THORACOSCOPY (VATS)/ RIGHT UPPER LOBECTOMY (Right)  Patient Location: PACU  Anesthesia Type:General  Level of Consciousness: awake, alert  and oriented  Airway & Oxygen Therapy: Patient Spontanous Breathing and Patient connected to face mask oxygen  Post-op Assessment: Report given to RN, Post -op Vital signs reviewed and stable and Patient moving all extremities X 4  Post vital signs: Reviewed and stable  Last Vitals:  Filed Vitals:   07/04/15 0606  BP: 142/76  Pulse: 76  Temp: 36.6 C  Resp: 18    Complications: No apparent anesthesia complications

## 2015-07-05 ENCOUNTER — Encounter (HOSPITAL_COMMUNITY): Payer: Self-pay | Admitting: Thoracic Surgery (Cardiothoracic Vascular Surgery)

## 2015-07-05 ENCOUNTER — Inpatient Hospital Stay (HOSPITAL_COMMUNITY): Payer: Medicare Other

## 2015-07-05 LAB — BASIC METABOLIC PANEL
Anion gap: 10 (ref 5–15)
BUN: 10 mg/dL (ref 6–20)
CO2: 26 mmol/L (ref 22–32)
Calcium: 8.5 mg/dL — ABNORMAL LOW (ref 8.9–10.3)
Chloride: 99 mmol/L — ABNORMAL LOW (ref 101–111)
Creatinine, Ser: 1.16 mg/dL (ref 0.61–1.24)
GFR calc Af Amer: >60 mL/min (ref >60)
GFR calc non Af Amer: >60 mL/min (ref >60)
Glucose, Bld: 131 mg/dL — ABNORMAL HIGH (ref 65–99)
Potassium: 3.7 mmol/L (ref 3.5–5.1)
Sodium: 135 mmol/L (ref 135–145)

## 2015-07-05 LAB — CBC
HCT: 38.1 % — ABNORMAL LOW (ref 39.0–52.0)
Hemoglobin: 12.7 g/dL — ABNORMAL LOW (ref 13.0–17.0)
MCH: 31.4 pg (ref 26.0–34.0)
MCHC: 33.3 g/dL (ref 30.0–36.0)
MCV: 94.1 fL (ref 78.0–100.0)
Platelets: 167 10*3/uL (ref 150–400)
RBC: 4.05 MIL/uL — ABNORMAL LOW (ref 4.22–5.81)
RDW: 14.2 % (ref 11.5–15.5)
WBC: 13 10*3/uL — ABNORMAL HIGH (ref 4.0–10.5)

## 2015-07-05 LAB — BLOOD GAS, ARTERIAL
Acid-Base Excess: 3.1 mmol/L — ABNORMAL HIGH (ref 0.0–2.0)
Bicarbonate: 28.4 mEq/L — ABNORMAL HIGH (ref 20.0–24.0)
O2 Content: 2 L/min
O2 Saturation: 96.5 %
Patient temperature: 98.6
TCO2: 30.1 mmol/L (ref 0–100)
pCO2 arterial: 54.6 mmHg — ABNORMAL HIGH (ref 35.0–45.0)
pH, Arterial: 7.337 — ABNORMAL LOW (ref 7.350–7.450)
pO2, Arterial: 89.8 mmHg (ref 80.0–100.0)

## 2015-07-05 LAB — GLUCOSE, CAPILLARY
Glucose-Capillary: 105 mg/dL — ABNORMAL HIGH (ref 65–99)
Glucose-Capillary: 110 mg/dL — ABNORMAL HIGH (ref 65–99)
Glucose-Capillary: 107 mg/dL — ABNORMAL HIGH (ref 65–99)
Glucose-Capillary: 120 mg/dL — ABNORMAL HIGH (ref 65–99)
Glucose-Capillary: 111 mg/dL — ABNORMAL HIGH (ref 65–99)

## 2015-07-05 MED ORDER — INSULIN ASPART 100 UNIT/ML ~~LOC~~ SOLN
0.0000 [IU] | Freq: Three times a day (TID) | SUBCUTANEOUS | Status: DC
  Administered 2015-07-06: 2 [IU] via SUBCUTANEOUS

## 2015-07-05 MED ORDER — ALBUTEROL SULFATE (2.5 MG/3ML) 0.083% IN NEBU
2.5000 mg | INHALATION_SOLUTION | Freq: Four times a day (QID) | RESPIRATORY_TRACT | Status: DC
  Administered 2015-07-05 – 2015-07-06 (×8): 2.5 mg via RESPIRATORY_TRACT
  Filled 2015-07-05 (×8): qty 3

## 2015-07-05 MED ORDER — ALBUTEROL SULFATE (2.5 MG/3ML) 0.083% IN NEBU
INHALATION_SOLUTION | RESPIRATORY_TRACT | Status: AC
  Administered 2015-07-05: 2.5 mg via RESPIRATORY_TRACT
  Filled 2015-07-05: qty 3

## 2015-07-05 NOTE — Care Management Note (Signed)
Case Management Note  Patient Details  Name: Jonathan [name] MRN: [id number] Date of Birth: April 16, 1940  Subjective/Objective:  From home alone, NCM will cont to monitor for dc needs.                    Action/Plan: SP VATS R U Lobe Lobectomy.  Chest tubes x 2 to suction, dc central line.  Expected Discharge Date:                  Expected Discharge Plan:  [place]  In-House Referral:     Discharge planning Services  CM Consult  Post Acute Care Choice:    Choice offered to:     DME Arranged:    DME Agency:     HH Arranged:    [place] Agency:     Status of Service:  In process, will continue to follow  Medicare Important Message Given:    Date Medicare IM Given:    Medicare IM give by:    Date Additional Medicare IM Given:    Additional Medicare Important Message give by:     If discussed at [place] of Stay Meetings, dates discussed:    Additional Comments:  Zenon Mayo, RN 07/05/2015, 3:44 PM

## 2015-07-05 NOTE — Progress Notes (Signed)
1 Day Post-Op Procedure(s) (LRB): VIDEO ASSISTED THORACOSCOPY (VATS)/ RIGHT UPPER LOBECTOMY (Right) Subjective: C/o incisional pain, mild nausea  Objective: Vital signs in last 24 hours: Temp:  [97.3 F (36.3 C)-98.5 F (36.9 C)] 97.7 F (36.5 C) (04/11 0700) Pulse Rate:  [66-90] 81 (04/11 0811) Cardiac Rhythm:  [-] Normal sinus rhythm (04/11 0812) Resp:  [8-24] 21 (04/11 0811) BP: (119-169)/(68-103) 120/103 mmHg (04/11 0710) SpO2:  [91 %-100 %] 91 % (04/11 0811) Arterial Line BP: (159-221)/(64-89) 185/77 mmHg (04/11 0811) Weight:  [275 lb 9.2 oz (125 kg)] 275 lb 9.2 oz (125 kg) (04/10 1343)  Hemodynamic parameters for last 24 hours:    Intake/Output from previous day: 04/10 0701 - 04/11 0700 In: 5686.7 [P.O.:240; I.V.:5196.7; IV Piggyback:250] Out: 8478 [Urine:2450; Blood:200; Chest Tube:610] Intake/Output this shift: Total I/O In: -  Out: 150 [Chest Tube:150]  General appearance: alert and no distress Neurologic: intact Heart: regular rate and rhythm Lungs: diminished breath sounds bibasilar Abdomen: normal findings: soft, non-tender + air leak  Lab Results:  Recent Labs  07/05/15 0500  WBC 13.0*  HGB 12.7*  HCT 38.1*  PLT 167   BMET:  Recent Labs  07/05/15 0500  NA 135  K 3.7  CL 99*  CO2 26  GLUCOSE 131*  BUN 10  CREATININE 1.16  CALCIUM 8.5*    PT/INR: No results for input(s): LABPROT, INR in the last 72 hours. ABG    Component Value Date/Time   PHART 7.337* 07/05/2015 0500   HCO3 28.4* 07/05/2015 0500   TCO2 30.1 07/05/2015 0500   O2SAT 96.5 07/05/2015 0500   CBG (last 3)   Recent Labs  07/04/15 1735 07/04/15 2308 07/05/15 0502  GLUCAP 167* 105* 110*    Assessment/Plan: S/P Procedure(s) (LRB): VIDEO ASSISTED THORACOSCOPY (VATS)/ RIGHT UPPER LOBECTOMY (Right) POD # 1 right upper lobectomy  CV- stable- restart PO meds   RESP- bibasilar atelectasis post lobectomy- IS  Hypercarbia- change albuterol to QID  RENAL- creatinine  and lytes oK  ENDO- CBG well controlled- change to AC/HS  DVT prophylaxis- SCD + enoxaparin  ambulate   LOS: 1 day    Jonathan [name] 07/05/2015

## 2015-07-05 NOTE — Anesthesia Postprocedure Evaluation (Addendum)
Anesthesia Post Note  Patient: Jonathan [name]  Procedure(s) Performed: Procedure(s) (LRB): VIDEO ASSISTED THORACOSCOPY (VATS)/ RIGHT UPPER LOBECTOMY (Right)  Patient location during evaluation: PACU Anesthesia Type: General Level of consciousness: awake and alert and patient cooperative Pain management: pain level controlled Vital Signs Assessment: post-procedure vital signs reviewed and stable Respiratory status: spontaneous breathing and respiratory function stable Cardiovascular status: stable Anesthetic complications: no    Last Vitals:  Filed Vitals:   07/05/15 0800 07/05/15 0811  BP:    Pulse:  81  Temp:    Resp: 19 21    Last Pain:  Filed Vitals:   07/05/15 0936  PainSc: 5                  [name] S

## 2015-07-05 NOTE — Op Note (Signed)
NAMEMarland Kitchen  LEVIE, WAGES NO.:  [id number]  MEDICAL RECORD NO.:  [id number]  LOCATION:  3S13C                        FACILITY:  [facility]  PHYSICIAN:  Revonda Standard. Roxan Hockey, M.D.DATE OF BIRTH:  18-Feb-1941  DATE OF PROCEDURE:  07/04/2015 DATE OF DISCHARGE:                              OPERATIVE REPORT   PREOPERATIVE DIAGNOSIS:  Squamous cell carcinoma, right upper lobe.  POSTOPERATIVE DIAGNOSIS:  Squamous cell carcinoma, right upper lobe.  PROCEDURE:   Right video-assisted thoracoscopy Thoracoscopic right upper lobectomy Mediastinal lymph node dissection On-Q local anesthetic catheter placement.  SURGEON:  Revonda Standard. Roxan Hockey, M.D.  ASSISTANT:  Arlester Marker, RNFA.  ANESTHESIA:  General.  FINDINGS:  Right upper lobe mass- squamous cell carcinoma. Bronchial margin negative for tumor.  CLINICAL NOTE:  Jonathan [name] is a 75 year old man with a history of tobacco abuse, who recently was found to have bilateral upper lobe nodules.  These are both biopsy-proven squamous cell carcinomas.  The right upper lobe mass was the larger of the two and also more solid.  He was advised to undergo right upper lobectomy with a plan for subsequent left upper lobectomy or radiation treatment to the left upper lobe nodule.  His mediastinal nodes were negative at endobronchial ultrasound.  The indications, risks, benefits, and alternatives were discussed in detail with the patient.  He understood and accepted the risks and agreed to proceed.  OPERATIVE NOTE:  Jonathan [name] was brought to the preoperative holding area on July 04, 2015.  Anesthesia placed a central line and arterial blood pressure monitoring line.  He was taken to the operating room, anesthetized, and intubated with a double-lumen endotracheal tube. Intravenous antibiotics were administered.  A Foley catheter was placed. Sequential compressive devices were placed on the calves for DVT prophylaxis.  He was placed  in a left lateral decubitus position.  The right chest was prepped and draped in the usual sterile fashion.  Single lung ventilation of the left lung was initiated and was tolerated well throughout the procedure.  An incision was made in the seventh intercostal space in the midaxillary line.  A 5 mm port was inserted and the thoracoscope was advanced in the chest.  There was good isolation of the right lung.  A 5 cm working incision was made anterolaterally in the fourth interspace.  The inferior ligament was divided and the pleural reflection was divided at the hilum anteriorly and posteriorly.  The superior pulmonary vein was identified.  The middle lobe vein was identified.  The upper lobe vein branches were dissected out separately due to their orientation.  It would have been difficult to staple them simultaneously.  The upper lobe vein branches were stapled separately with the endoscopic vascular stapler.  The fissures were inspected. The major fissure was nearly complete, the minor fissure was incomplete.  The pulmonary artery was dissected out at the confluence of the fissures.  The superior segmental and posterior ascending branches of the pulmonary artery were identified.  The major fissure was completed posteriorly between the right upper lobe and the superior segment with sequential firings of an endoscopic stapler using a gold cartridge.  The Ethicon Echelon powered stapler was used for the parenchymal and  bronchial staple lines.  The minor fissure then was completed with sequential firings of the endoscopic stapler, again using gold cartridges.  The middle lobe was tacked to the lower lobe with interrupted 3-0 Vicryl figure-of-eight sutures to prevent torsion.  The anterior and apical segmental branches arose from the pulmonary artery as a common trunk.  These were encircled and divided with the endoscopic vascular stapler.  There was a large node adjacent to the bronchus  which was removed and sent as a separate specimen, as were all nodes that were encountered during the vascular dissection.  The stapler was placed across the right upper lobe bronchus and closed.  A test inflation showed good aeration of the lower and middle lobes.  The stapler was fired transecting the bronchus.  The right upper lobe was placed into an endoscopic retrieval bag.  However, it was a very large lobe and would not come through the chest wall without spreading the ribs.  A laminar spreader was placed and opened and this allowed removal of the right upper lobe.  It was sent for frozen section of the mass and margins, which showed squamous cell carcinoma with clear margins.  While awaiting the results of the frozen section, a level 7 node which appeared normal was removed and sent for permanent pathology.  The pleura overlying the azygos vein was incised and the right paratracheal nodes were resected. Multiple nodes were present.  These all appeared relatively normal. They were sent for permanent pathology.  The chest was copiously irrigated with warm saline.  A test inflation showed no air leakage from the bronchial stump.  There was minimal parenchymal air leak.  An On-Q local anesthetic catheter was placed through a separate stab incision posteriorly and tunneled into an extrapleural location.  It was primed with 5 mL of 0.5% Marcaine.  It was secured to the skin with a 3-0 silk suture.  A 28-French Blake drain was placed through the original port incision and directed apically and posteriorly.  A 28-French chest tube was placed through a separate port incision and directed through the apex anteriorly.  They were secured to the skin with #1 silk sutures. The lower and middle lobes were reinflated.  There was good aeration. The chest incision was closed with a #1 Vicryl fascial suture followed by 2-0 Vicryl subcutaneous suture and a 3-0 Vicryl subcuticular suture. The chest  tubes were placed to suction.  The patient was placed back in the supine position.  He was extubated in the operating room and taken to the postanesthetic care unit in good condition.  All sponge, needle, and instrument counts were correct at the end of the procedure.     Revonda Standard Roxan Hockey, M.D.     SCH/MEDQ  D:  07/04/2015  T:  07/05/2015  Job:  [id number]

## 2015-07-05 NOTE — Progress Notes (Signed)
At shift change report night shift RN and day shift RN went in to assess the patient. The patient was awake and oriented. His central line dressing was found to be saturated, and the right neck swollen slightly. IVF were turned off at this time and MD Roxan Hockey made aware. New PIV access obtained and central line to be removed per order. Patient stable, and reports not SOB or issues swallowing. Will continue to monitor.

## 2015-07-06 ENCOUNTER — Inpatient Hospital Stay (HOSPITAL_COMMUNITY): Payer: Medicare Other

## 2015-07-06 LAB — COMPREHENSIVE METABOLIC PANEL
ALT: 17 U/L (ref 17–63)
AST: 41 U/L (ref 15–41)
Albumin: 2.8 g/dL — ABNORMAL LOW (ref 3.5–5.0)
Alkaline Phosphatase: 49 U/L (ref 38–126)
Anion gap: 9 (ref 5–15)
BUN: 11 mg/dL (ref 6–20)
CO2: 29 mmol/L (ref 22–32)
Calcium: 8.4 mg/dL — ABNORMAL LOW (ref 8.9–10.3)
Chloride: 99 mmol/L — ABNORMAL LOW (ref 101–111)
Creatinine, Ser: 1.38 mg/dL — ABNORMAL HIGH (ref 0.61–1.24)
GFR calc Af Amer: 57 mL/min — ABNORMAL LOW (ref >60)
GFR calc non Af Amer: 49 mL/min — ABNORMAL LOW (ref >60)
Glucose, Bld: 115 mg/dL — ABNORMAL HIGH (ref 65–99)
Potassium: 3.9 mmol/L (ref 3.5–5.1)
Sodium: 137 mmol/L (ref 135–145)
Total Bilirubin: 0.7 mg/dL (ref 0.3–1.2)
Total Protein: 6.1 g/dL — ABNORMAL LOW (ref 6.5–8.1)

## 2015-07-06 LAB — CBC
HCT: 37.3 % — ABNORMAL LOW (ref 39.0–52.0)
Hemoglobin: 12.2 g/dL — ABNORMAL LOW (ref 13.0–17.0)
MCH: 31.2 pg (ref 26.0–34.0)
MCHC: 32.7 g/dL (ref 30.0–36.0)
MCV: 95.4 fL (ref 78.0–100.0)
Platelets: 144 10*3/uL — ABNORMAL LOW (ref 150–400)
RBC: 3.91 MIL/uL — ABNORMAL LOW (ref 4.22–5.81)
RDW: 14.4 % (ref 11.5–15.5)
WBC: 12.4 10*3/uL — ABNORMAL HIGH (ref 4.0–10.5)

## 2015-07-06 LAB — GLUCOSE, CAPILLARY
Glucose-Capillary: 118 mg/dL — ABNORMAL HIGH (ref 65–99)
Glucose-Capillary: 127 mg/dL — ABNORMAL HIGH (ref 65–99)
Glucose-Capillary: 81 mg/dL (ref 65–99)
Glucose-Capillary: 227 mg/dL — ABNORMAL HIGH (ref 65–99)

## 2015-07-06 MED ORDER — POTASSIUM CHLORIDE CRYS ER 20 MEQ PO TBCR
20.0000 meq | EXTENDED_RELEASE_TABLET | Freq: Once | ORAL | Status: AC
  Administered 2015-07-06: 20 meq via ORAL
  Filled 2015-07-06: qty 1

## 2015-07-06 NOTE — Progress Notes (Addendum)
      [address]       [place] [address]             [phone]       2 Days Post-Op Procedure(s) (LRB): VIDEO ASSISTED THORACOSCOPY (VATS)/ RIGHT UPPER LOBECTOMY (Right)  Subjective: Patient sleeping-was awakened. Has some incisional pain.  Objective: Vital signs in last 24 hours: Temp:  [97.8 F (36.6 C)-99 F (37.2 C)] 98.2 F (36.8 C) (04/12 0320) Pulse Rate:  [67-77] 76 (04/12 0325) Cardiac Rhythm:  [-] Normal sinus rhythm (04/11 2020) Resp:  [11-17] 13 (04/12 0756) BP: (92-106)/(56-78) 98/56 mmHg (04/12 0325) SpO2:  [90 %-98 %] 94 % (04/12 0756)     Intake/Output from previous day: 04/11 0701 - 04/12 0700 In: 1292.5 [P.O.:120; I.V.:1122.5; IV Piggyback:50] Out: 2200 [Urine:1650; Chest Tube:550]   Physical Exam:  Cardiovascular: RRR Pulmonary: Clear to auscultation left and diminished right base. Abdomen: Soft, non tender, bowel sounds present. Extremities: Trace bilateral lower extremity edema. Wounds: Dressing is clean and dry.   Chest Tubes: to suction, + 1 air leak  Lab Results: CBC: Recent Labs  07/05/15 0500 07/06/15 0351  WBC 13.0* 12.4*  HGB 12.7* 12.2*  HCT 38.1* 37.3*  PLT 167 144*   BMET:  Recent Labs  07/05/15 0500 07/06/15 0351  NA 135 137  K 3.7 3.9  CL 99* 99*  CO2 26 29  GLUCOSE 131* 115*  BUN 10 11  CREATININE 1.16 1.38*  CALCIUM 8.5* 8.4*    PT/INR: No results for input(s): LABPROT, INR in the last 72 hours. ABG:  INR: Will add last result for INR, ABG once components are confirmed Will add last 4 CBG results once components are confirmed  Assessment/Plan:  1. CV - SR. On Atenolol 50 mg at hs. 2.  Pulmonary - On 2 liters of oxygen via . Wean to room air as tolerates. Chest tube with 550 cc of output last 24 hours. Chest tube is to suction. There is +1 air leak. Will leave chest tubes for now.CXR taken but unable to view at this time. Will follow up. Continue Spiriva. Encourage incentive  spirometer. 3. H and H stable at 12.2 and 37.3 4. Supplement potassium 5. DM-CBGs 107/120/111. On Insulin. Creatinine slightly increased from 1.16 to 1.38 so will not restart Metformin yet. 6. Creatinine slightly increased from to 1.3. Is on Chlorthalidone (component of Tenoretic). Will monitor  ZIMMERMAN,DONIELLE MPA-C 07/06/2015,8:16 AM  Patient seen and examined, agree with above   Remo Lipps C. Roxan Hockey, MD [facility] [phone]

## 2015-07-06 NOTE — Clinical Documentation Improvement (Signed)
Cardiothoracic Please update your documentation within the medical record to reflect your response to this query. Thank you  Can the diagnosis of CKD be further specified?   CKD Stage I - GFR greater than or equal to 90  CKD Stage II - GFR 60-89  CKD Stage III - GFR 30-59  CKD Stage IV - GFR 15-29  CKD Stage V - GFR < 15  ESRD (End Stage Renal Disease)  Other condition  Unable to clinically determine  Supporting Information: : (risk factors, signs and symptoms, diagnostics, treatment) 07/04/15 H&P..."CKD (chronic kidney disease)"... 07/06/15 progr note ".Marland KitchenMarland KitchenCreatinine slightly increased from 1.16 to 1.38 so will not restart Metformin yet. 6. Creatinine slightly increased from to 1.3. Is on Chlorthalidone (component of Tenoretic). Will monitor"... Results for Jonathan, [name] (MRN [id number]) as of 07/06/2015 08:56  07/05/2015 05:00 07/06/2015 03:51  Creatinine 1.16 1.38 (H)  EGFR (African American) >60 57 (L)   Please exercise your independent, professional judgment when responding. A specific answer is not anticipated or expected.  Thank You, Ermelinda Das, RN, BSN, [place] Certified Clinical Documentation Specialist [place]: Health Information Management [phone]

## 2015-07-07 ENCOUNTER — Other Ambulatory Visit: Payer: Self-pay

## 2015-07-07 ENCOUNTER — Inpatient Hospital Stay (HOSPITAL_COMMUNITY): Payer: Medicare Other

## 2015-07-07 LAB — BASIC METABOLIC PANEL
Anion gap: 10 (ref 5–15)
BUN: 10 mg/dL (ref 6–20)
CO2: 30 mmol/L (ref 22–32)
Calcium: 8.6 mg/dL — ABNORMAL LOW (ref 8.9–10.3)
Chloride: 97 mmol/L — ABNORMAL LOW (ref 101–111)
Creatinine, Ser: 1.23 mg/dL (ref 0.61–1.24)
GFR calc Af Amer: >60 mL/min (ref >60)
GFR calc non Af Amer: 56 mL/min — ABNORMAL LOW (ref >60)
Glucose, Bld: 106 mg/dL — ABNORMAL HIGH (ref 65–99)
Potassium: 3.7 mmol/L (ref 3.5–5.1)
Sodium: 137 mmol/L (ref 135–145)

## 2015-07-07 LAB — CBC
HCT: 37 % — ABNORMAL LOW (ref 39.0–52.0)
Hemoglobin: 11.8 g/dL — ABNORMAL LOW (ref 13.0–17.0)
MCH: 30.3 pg (ref 26.0–34.0)
MCHC: 31.9 g/dL (ref 30.0–36.0)
MCV: 95.1 fL (ref 78.0–100.0)
Platelets: 161 10*3/uL (ref 150–400)
RBC: 3.89 MIL/uL — ABNORMAL LOW (ref 4.22–5.81)
RDW: 14.2 % (ref 11.5–15.5)
WBC: 9.3 10*3/uL (ref 4.0–10.5)

## 2015-07-07 LAB — GLUCOSE, CAPILLARY
Glucose-Capillary: 105 mg/dL — ABNORMAL HIGH (ref 65–99)
Glucose-Capillary: 98 mg/dL (ref 65–99)
Glucose-Capillary: 120 mg/dL — ABNORMAL HIGH (ref 65–99)
Glucose-Capillary: 102 mg/dL — ABNORMAL HIGH (ref 65–99)
Glucose-Capillary: 115 mg/dL — ABNORMAL HIGH (ref 65–99)

## 2015-07-07 MED ORDER — AMIODARONE HCL 200 MG PO TABS: 400.0000 mg | ORAL_TABLET | Freq: Two times a day (BID) | ORAL | Status: DC

## 2015-07-07 MED ORDER — METOPROLOL TARTRATE 1 MG/ML IV SOLN
2.5000 mg | INTRAVENOUS | Status: DC | PRN
  Administered 2015-07-07 (×2): 2.5 mg via INTRAVENOUS
  Filled 2015-07-07: qty 5

## 2015-07-07 MED ORDER — LEVALBUTEROL HCL 0.63 MG/3ML IN NEBU
0.6300 mg | INHALATION_SOLUTION | Freq: Four times a day (QID) | RESPIRATORY_TRACT | Status: DC
  Administered 2015-07-08 – 2015-07-09 (×8): 0.63 mg via RESPIRATORY_TRACT
  Filled 2015-07-07 (×9): qty 3

## 2015-07-07 MED ORDER — METOPROLOL TARTRATE 1 MG/ML IV SOLN
INTRAVENOUS | Status: AC
  Filled 2015-07-07: qty 5

## 2015-07-07 MED ORDER — AMIODARONE HCL IN DEXTROSE 360-4.14 MG/200ML-% IV SOLN
30.0000 mg/h | INTRAVENOUS | Status: AC
  Administered 2015-07-08: 30 mg/h via INTRAVENOUS
  Filled 2015-07-07: qty 200

## 2015-07-07 MED ORDER — POTASSIUM CHLORIDE CRYS ER 10 MEQ PO TBCR
30.0000 meq | EXTENDED_RELEASE_TABLET | Freq: Once | ORAL | Status: AC
  Administered 2015-07-07: 30 meq via ORAL
  Filled 2015-07-07: qty 1

## 2015-07-07 MED ORDER — GUAIFENESIN ER 600 MG PO TB12
600.0000 mg | ORAL_TABLET | Freq: Two times a day (BID) | ORAL | Status: DC
  Administered 2015-07-07 (×2): 600 mg via ORAL
  Filled 2015-07-07 (×2): qty 1

## 2015-07-07 MED ORDER — FUROSEMIDE 40 MG PO TABS
40.0000 mg | ORAL_TABLET | Freq: Once | ORAL | Status: AC
  Administered 2015-07-07: 40 mg via ORAL
  Filled 2015-07-07: qty 1

## 2015-07-07 MED ORDER — POTASSIUM CHLORIDE CRYS ER 20 MEQ PO TBCR
20.0000 meq | EXTENDED_RELEASE_TABLET | Freq: Once | ORAL | Status: AC
  Administered 2015-07-07: 20 meq via ORAL
  Filled 2015-07-07: qty 1

## 2015-07-07 MED ORDER — AMIODARONE HCL IN DEXTROSE 360-4.14 MG/200ML-% IV SOLN
INTRAVENOUS | Status: AC
  Administered 2015-07-07: 200 mL
  Filled 2015-07-07: qty 200

## 2015-07-07 MED ORDER — AMIODARONE HCL IN DEXTROSE 360-4.14 MG/200ML-% IV SOLN
60.0000 mg/h | INTRAVENOUS | Status: AC
  Administered 2015-07-07 (×2): 60 mg/h via INTRAVENOUS
  Filled 2015-07-07 (×2): qty 200

## 2015-07-07 MED ORDER — AMIODARONE LOAD VIA INFUSION
150.0000 mg | Freq: Once | INTRAVENOUS | Status: AC
  Administered 2015-07-07: 150 mg via INTRAVENOUS

## 2015-07-07 MED ORDER — AMIODARONE IV BOLUS ONLY 150 MG/100ML
150.0000 mg | Freq: Once | INTRAVENOUS | Status: AC
  Filled 2015-07-07: qty 100

## 2015-07-07 MED ORDER — AMIODARONE LOAD VIA INFUSION: 150.0000 mg | Freq: Once | INTRAVENOUS | Status: DC

## 2015-07-07 MED ORDER — AMIODARONE IV BOLUS ONLY 150 MG/100ML
150.0000 mg | Freq: Once | INTRAVENOUS | Status: DC
  Filled 2015-07-07: qty 100

## 2015-07-07 NOTE — Progress Notes (Signed)
Patients HR sustaining in the 120's PA Gold and MD [facility] aware.  Will continue to monitor.

## 2015-07-07 NOTE — Discharge Instructions (Signed)
Thoracoscopy, Care After Refer to this sheet in the next few weeks. These instructions provide you with information about caring for yourself after your procedure. Your health care provider may also give you more specific instructions. Your treatment has been planned according to current medical practices, but problems sometimes occur. Call your health care provider if you have any problems or questions after your procedure. WHAT TO EXPECT AFTER THE PROCEDURE: After your procedure, it is common to feel sore for up to two weeks. HOME CARE INSTRUCTIONS  There are many different ways to close and cover an incision, including stitches (sutures), skin glue, and adhesive strips. Follow your health care provider's instructions about:  Incision care.  Bandage (dressing) changes and removal.  Incision closure removal.  Check your incision area every day for signs of infection. Watch for:  Redness, swelling, or pain.  Fluid, blood, or pus.  Take medicines only as directed by your health care provider.  Try to cough often. Coughing helps to protect against lung infection (pneumonia). It may hurt to cough. If this happens, hold a pillow against your chest when you cough.  Take deep breaths. This also helps to protect against pneumonia.  If you were given an incentive spirometer, use it as directed by your health care provider.  Do not take baths, swim, or use a hot tub until your health care provider approves. You may take showers.  Avoid lifting until your health care provider approves.  Avoid driving until your health care provider approves.  Do not travel by airplane after the chest tube is removed until your health care provider approves. SEEK MEDICAL CARE IF:  You have a fever.  Pain medicines do not ease your pain.  You have redness, swelling, or increasing pain in your incision area.  You develop a cough that does not go away, or you are coughing up mucus that is yellow or  green. SEEK IMMEDIATE MEDICAL CARE IF:  You have fluid, blood, or pus coming from your incision.  There is a bad smell coming from your incision or dressing.  You develop a rash.  You have difficulty breathing.  You cough up blood.  You develop light-headedness or you feel faint.  You develop chest pain.  Your heartbeat feels irregular or very fast.   This information is not intended to replace advice given to you by your health care provider. Make sure you discuss any questions you have with your health care provider.   Document Released: 09/29/2004 Document Revised: 04/02/2014 Document Reviewed: 11/25/2013 Elsevier Interactive Patient Education [employer].

## 2015-07-07 NOTE — Progress Notes (Signed)
[place] changed per protocol.  No additional output.

## 2015-07-07 NOTE — Progress Notes (Signed)
While placing a foley the patient converted into a-fib rvr shortly after converted back into NSR.  Contacted PA Tacy Dura while updating PA the patient converted back into a-fib with a rate ranging from the 90's to the 130's.  Received an order for a bolus dose of Amiodorone '150mg'$  IV.  Patient unable to convert back into NSR after receiving bolus dose.  HR continues to be in a-fib with a rate in the 140's.  Contacted PA Tacy Dura received an order for continuous Amiodorone gtt.  Pt. HR unaffected by the continuous Amiodorone with a HR sustaining in the 130's.  Contacted PA Tacy Dura and received an order for a second bolus of Amiodorone '150mg'$  IV administered with no results.  Administered a 12led EKG with a reading of Atrial flutter with a variable AV block.  Pt. HR sustaining in the 130's with a BP decreasing to 80's/70's patient is asymptomatic.  Contacted PA Tacy Dura directed to contacted MD [employer]  MD Roxan Hockey updated on patients condition received a verbal order for Lopressor 2.'5mg'$  once and second dose if HR remains sustained twenty minutes after first dose, requests Jadene Pierini be notified.  Updated PA Gold on patients condition PA requests a call back if second dose of Lopressor given.  PA Gold followed up in person after second dose no need to page back.  Will continue to follow up.

## 2015-07-07 NOTE — Progress Notes (Addendum)
      [address]       [place] [address]             [phone]       3 Days Post-Op Procedure(s) (LRB): VIDEO ASSISTED THORACOSCOPY (VATS)/ RIGHT UPPER LOBECTOMY (Right)  Subjective: Patient states he has been coughing a lot and hurts "all over."  Objective: Vital signs in last 24 hours: Temp:  [97.9 F (36.6 C)-98.8 F (37.1 C)] 98.2 F (36.8 C) (04/13 0401) Pulse Rate:  [73-146] 146 (04/13 0405) Cardiac Rhythm:  [-] Normal sinus rhythm (04/13 0405) Resp:  [12-25] 25 (04/13 0405) BP: (101-134)/(65-113) 111/75 mmHg (04/13 0405) SpO2:  [91 %-100 %] 94 % (04/13 0405)     Intake/Output from previous day: 04/12 0701 - 04/13 0700 In: 864 [P.O.:540; I.V.:324] Out: 2035 [Urine:1435; Chest Tube:600]   Physical Exam:  Cardiovascular: RRR Pulmonary: Clear to auscultation left and diminished right base. Abdomen: Soft, non tender, bowel sounds present. Extremities: Trace bilateral lower extremity edema. Wounds: Dressing is clean and dry.   Chest Tubes: to suction, intermittent + 1 air leak with cough  Lab Results: CBC:  Recent Labs  07/06/15 0351 07/07/15 0401  WBC 12.4* 9.3  HGB 12.2* 11.8*  HCT 37.3* 37.0*  PLT 144* 161   BMET:   Recent Labs  07/06/15 0351 07/07/15 0401  NA 137 137  K 3.9 3.7  CL 99* 97*  CO2 29 30  GLUCOSE 115* 106*  BUN 11 10  CREATININE 1.38* 1.23  CALCIUM 8.4* 8.6*    PT/INR: No results for input(s): LABPROT, INR in the last 72 hours. ABG:  INR: Will add last result for INR, ABG once components are confirmed Will add last 4 CBG results once components are confirmed  Assessment/Plan:  1. CV - SR. On Atenolol 50 mg at hs. 2.  Pulmonary - On 2 liters of oxygen via [place]. Wean to room air as tolerates. Chest tube with 600 cc of output last 24 hours. Chest tube is to suction. There is an intermittent +1 air leak with cough. Will leave chest tubes for now but place to water seal.CXR appears to show pulmonary  vascular congestion, no pneumothorax, and right base atelectasis. Continue Spiriva. Will give Mucinex for cough.Encourage incentive spirometer. Pathology showed: INVASIVE SQUAMOUS CELL CARCINOMA, WELL DIFFERENTIATED, SPANNING 2.6 CM.TNM code: pT1b, pN0. I did not discuss pathology with patient. 3. H and H stable at 11.8 and 37 4. Supplement potassium 5. DM-CBGs 127/81/227. On Insulin. Creatinine slightly decreased from 1.38 to 1.23 so will not restart Metformin yet. 6. Urinary retention-required in and out foley yesterday. On Flomax. Monitor. According to nurse, he has self cath'd at home 7. Remove On Q 8. Will give Lasix this am  ZIMMERMAN,DONIELLE MPA-C 07/07/2015,7:28 AM  He is requesting to stop tylenol and gabapentin- says tylenol upsets his stomach and was not taking gabapentin  Still has a small air leak, will try on water seal  Remo Lipps C. Roxan Hockey, MD [facility] [phone]

## 2015-07-07 NOTE — Care Management Note (Signed)
Case Management Note  Patient Details  Name: Jonathan [name] MRN: [id number] Date of Birth: 1940/11/18  Subjective/Objective:    Patient is s/p vats, chest tube x 2, from home, now in afib, on cardizem drip, NCM will cont to follow for dc needs.                Action/Plan:   Expected Discharge Date:                  Expected Discharge Plan:  [place]  In-House Referral:     Discharge planning Services  CM Consult  Post Acute Care Choice:    Choice offered to:     DME Arranged:    DME Agency:     HH Arranged:    [place] Agency:     Status of Service:  In process, will continue to follow  Medicare Important Message Given:  Yes Date Medicare IM Given:    Medicare IM give by:    Date Additional Medicare IM Given:    Additional Medicare Important Message give by:     If discussed at [place] of Stay Meetings, dates discussed:    Additional Comments:  Zenon Mayo, RN 07/07/2015, 5:13 PM

## 2015-07-07 NOTE — Progress Notes (Signed)
  Amiodarone Drug - Drug Interaction Consult Note  Recommendations: Amiodarone is metabolized by the cytochrome P450 system and therefore has the potential to cause many drug interactions. Amiodarone has an average plasma half-life of 50 days (range 20 to 100 days).   There is potential for drug interactions to occur several weeks or months after stopping treatment and the onset of drug interactions may be slow after initiating amiodarone.   '[]'$  Statins: Increased risk of myopathy. Simvastatin- restrict dose to '20mg'$  daily. Other statins: counsel patients to report any muscle pain or weakness immediately.  '[]'$  Anticoagulants: Amiodarone can increase anticoagulant effect. Consider warfarin dose reduction. Patients should be monitored closely and the dose of anticoagulant altered accordingly, remembering that amiodarone levels take several weeks to stabilize.  '[]'$  Antiepileptics: Amiodarone can increase plasma concentration of phenytoin, the dose should be reduced. Note that small changes in phenytoin dose can result in large changes in levels. Monitor patient and counsel on signs of toxicity.  '[x]'$  Beta blockers: increased risk of bradycardia, AV block and myocardial depression. Sotalol - avoid concomitant use.  '[]'$   Calcium channel blockers (diltiazem and verapamil): increased risk of bradycardia, AV block and myocardial depression.  '[]'$   Cyclosporine: Amiodarone increases levels of cyclosporine. Reduced dose of cyclosporine is recommended.  '[]'$  Digoxin dose should be halved when amiodarone is started.  '[x]'$  Diuretics: increased risk of cardiotoxicity if hypokalemia occurs.  '[]'$  Oral hypoglycemic agents (glyburide, glipizide, glimepiride): increased risk of hypoglycemia. Patient's glucose levels should be monitored closely when initiating amiodarone therapy.   '[x]'$  Drugs that prolong the QT interval:  Torsades de pointes risk may be increased with concurrent use - avoid if possible.  Monitor QTc, also  keep magnesium/potassium WNL if concurrent therapy can't be avoided. [name] Antibiotics: e.g. fluoroquinolones, erythromycin. . Antiarrhythmics: e.g. quinidine, procainamide, disopyramide, sotalol. . Antipsychotics: e.g. phenothiazines, haloperidol.  . Lithium, tricyclic antidepressants, and methadone. Thank You,  Elmer Ramp  07/07/2015 1:48 PM

## 2015-07-07 NOTE — Progress Notes (Signed)
Removed patients OnQ pump, pt. Tolerated well.

## 2015-07-07 NOTE — Progress Notes (Signed)
PT Cancellation Note  Patient Details Name: Jonathan [name] MRN: [id number] DOB: 31-Oct-1940   Cancelled Treatment:    Reason Eval/Treat Not Completed: Medical issues which prohibited therapy.  Per conversation w/ RN pt went into a-fib w/ RVR w/ orders for amiodarone.  Will hold PT at this time but will continue to follow acutely to complete evaluation once appropriate.  Collie Siad PT, DPT  Pager: [phone] Phone: [phone] 07/07/2015, 2:35 PM

## 2015-07-07 NOTE — Discharge Summary (Signed)
Physician Discharge Summary       [address]       [place] [address]             [phone]    Patient ID: Jonathan [name] MRN: [id number] DOB/AGE: 24-Sep-1940 75 y.o.  Admit date: 07/04/2015 Discharge date: 07/14/2015  Admission Diagnoses: Squamous cell carcinoma, right upper lobe.  Active Diagnoses:  1. Nicotine addiction 2. Dyslipidemia 3. Obesity 4. Hypertension 5. Erectile dysfunction 6. Polyneuropathy in other diseases classified elsewhere ([place]) 7. COPD (chronic obstructive pulmonary disease) ([place]) 8. GERD (gastroesophageal reflux disease) 9. Neuropathy ([place]) 10. Diabetes mellitus without complication ([place]) 11. Abdominal aortic aneurysm (HCC);pateinet denies 12. CKD (chronic kidney disease);patient denies 13. Depression 14. Anxiety 15. Lesion of ulnar nerve 16. Post op a fib  Procedure (s):  Right video-assisted thoracoscopy, thoracoscopic right upper lobectomy, mediastinal lymph node dissection, and On-Q local anesthetic catheter placement by Dr. Roxan Hockey on 07/04/2015.   Pathology: 1. Lung, resection (segmental or lobe), Right Upper Lobe - INVASIVE SQUAMOUS CELL CARCINOMA, WELL DIFFERENTIATED, SPANNING 2.6 CM. - THE SURGICAL RESECTION MARGINS ARE NEGATIVE FOR CARCINOMA. - SEE ONCOLOGY TABLE BELOW. 2. Lymph node, biopsy, 7 Node - THERE IS NO EVIDENCE OF CARCINOMA IN 1 OF 1 LYMPH NODE (0/1). 3. Lymph node, biopsy, 11 R - THERE IS NO EVIDENCE OF CARCINOMA IN 1 OF 1 LYMPH NODE (0/1). 4. Lymph node, biopsy, 11 R #2 - THERE IS NO EVIDENCE OF CARCINOMA IN 1 OF 1 LYMPH NODE (0/1). 5. Lymph node, biopsy, 10 R - THERE IS NO EVIDENCE OF CARCINOMA IN 1 OF 1 LYMPH NODE (0/1). 6. Lymph node, biopsy, 11R #3 - THERE IS NO EVIDENCE OF CARCINOMA IN 1 OF 1 LYMPH NODE (0/1). 7. Lymph node, biopsy, 12R - THERE IS NO EVIDENCE OF CARCINOMA IN 1 OF 1 LYMPH NODE (0/1). 8. Lymph node, biopsy, 12R #2 - THERE IS NO EVIDENCE OF CARCINOMA IN 1 OF 1  LYMPH NODE (0/1). 9. Lymph node, biopsy, 12R #3 - THERE IS NO EVIDENCE OF CARCINOMA IN 1 OF 1 LYMPH NODE (0/1). 10. Lymph node, biopsy, 12R #4 - THERE IS NO EVIDENCE OF CARCINOMA IN 1 OF 1 LYMPH NODE (0/1). 11. Lymph node, biopsy, 4R - THERE IS NO EVIDENCE OF CARCINOMA IN 1 OF 1 LYMPH NODE (0/1). TNM Code:pT1b, pN0.   History of Presenting Illness: Jonathan [name] is a 75 year old man with past medical history significant for tobacco abuse (50+ pack years), COPD, type 2 diabetes, hypertension, hyperlipidemia, gastroesophageal reflux, abdominal aortic aneurysm, neuropathy (secondary to agent orange exposure), arthritis, recent right ankle replacement, and chronic kidney disease. The details are murky but he has been followed for a right upper lobe nodule. He recently had a CT scan that showed a right upper lobe nodule had increased in size and there were branching tubular densities around it. There also was a new cavitary lesion in the left upper lobe. He saw Dr. Jacquiline Doe. A PET CT showed both lesions were markedly hypermetabolic, but there were no other worrisome findings. Specifically there was no hilar or mediastinal adenopathy.  Jonathan [name] denies any history of cardiac disease. He smoked until November 2016. He smoked about a pack a day for over 50 years. He had a right ankle replacement in January of this year and his activities have been limited since then. He is currently walking with a boot. He gets short of breath with heavy exertion but has not noticed any change in that recently. He denies  any chest pain, pressure, or tightness. He's lost about 5 pounds over the past 3 months and 10 pounds over the past 6 months. He has an occasional cough, but denies wheezing or hemoptysis.  Dr. Roxan Hockey did a navigational bronchoscopy on 06/17/2015 and biopsied both upper lobe nodules. I also did endobronchial ultrasound to sample the mediastinal lymph nodes. Both the upper lobe nodules were positive for  squamous cell carcinoma. Different instruments were used to biopsy each of the upper lobe nodules. The mediastinal lymph nodes were negative although the samples were suboptimal.  He had questions as to the cause of his lung cancer. The primary cause is likely tobacco abuse. Agent orange exposure is more likely than not to be a contributing factor as well. I did review his veterans file and service medical records.  Dr. Roxan Hockey recommended that we began with a right VATS and right upper lobectomy with node dissection for treatment of the right upper lobe squamous cell carcinoma. After seeing how he does we can consider surgery or stereotactic radiation for the left upper lobe nodule. Dr. Roxan Hockey described the proposed operation to him in detail. He understands the general nature of the procedure, the use of general anesthesia, the incisions to be used, the expected hospital stay, and overall recovery. He was admitted on 07/04/2015 in order to undergo a right VATS, thoracoscopic RUL, lymph node dissection, and On Q placement on 07/04/2015.  Brief Hospital Course:  He remained afebrile and hemodynamically stable. A line and foley were removed early in his post operative course. He did have urinary retention on post op day 3. He required an in and out cath. He was on Flomax as taken pre op. He did require foley reinsertion. He had another voiding trial and was later able to void on his own. His chest tube output gradually decreased. There was an air leak post op that was persistent. [name] chest x rays were obtained and remained stable. All chest tubes were removed by 04/**. He initially required 2 liters of oxygen via [place]. He was weaned to room air. He is tolerating a diet and has had a bowel movement. His wound is clean and dry and continuing to heal. He went into a fib with RVR on 04/13. He was given an Amiodarone bolus and put on a drip. He converted and remained in sinus rhythm. He is on Amiodarone  400 mg bid as well as Atenolol 25 mg at hs (taken pre op). His heart is mostly in the 60's so I decreased his Amiodarone to 200 mg [name]. Home PT was recommended and was arranged. CXR this am shows stable right apical pneumothorax. Per Dr. Roxan Hockey, patient may be discharged. He is felt surgically stable for today.    Latest Vital Signs: Blood pressure 108/67, pulse 56, temperature 98 F (36.7 C), temperature source Oral, resp. rate 18, height '6\' 8"'$  (2.032 m), weight 275 lb 9.2 oz (125 kg), SpO2 100 %.  Physical Exam: General appearance: alert, cooperative and no distress Neurologic: intact Heart: regular rate and rhythm Lungs: diminished breath sounds right base Wound: clean and dry  Discharge Condition:Stable and discharged to home  Recent laboratory studies:  Lab Results  Component Value Date   WBC 9.3 07/07/2015   HGB 11.8* 07/07/2015   HCT 37.0* 07/07/2015   MCV 95.1 07/07/2015   PLT 161 07/07/2015   Lab Results  Component Value Date   NA 136 07/12/2015   K 3.5 07/12/2015   CL 98* 07/12/2015  CO2 26 07/12/2015   CREATININE 1.27* 07/12/2015   GLUCOSE 110* 07/12/2015   Diagnostic Studies:  EXAM: CHEST 2 VIEW  COMPARISON: 07/13/15  FINDINGS: Heart size and vascular pattern normal. Left lung clear. On the right, upper lobe pneumothorax is stable. Loculated pneumothorax right base, stable in size, but now demonstrates an air-fluid level seen best on the lateral view.  IMPRESSION: Stable pneumothorax right upper lobe. Loculated hydropneumothorax right base.   Electronically Signed  By: Skipper Cliche M.D.  On: 07/14/2015 07:41    Discharge Medications:   Medication List    STOP taking these medications        atenolol-chlorthalidone 50-25 MG tablet  Commonly known as:  TENORETIC     mupirocin cream 2 %  Commonly known as:  BACTROBAN      TAKE these medications        albuterol 108 (90 Base) MCG/ACT inhaler  Commonly known as:  PROVENTIL  HFA;VENTOLIN HFA  Inhale 1 puff into the lungs every 6 (six) hours as needed for wheezing or shortness of breath.     amiodarone 200 MG tablet  Commonly known as:  PACERONE  Take 1 tablet (200 mg total) by mouth [name].     atenolol 25 MG tablet  Commonly known as:  TENORMIN  Take 1 tablet (25 mg total) by mouth at bedtime.     COLON CLEANSE PO  Take 1 capsule by mouth [name].     DUONEB 0.5-2.5 (3) MG/3ML Soln  Generic drug:  ipratropium-albuterol  Take 3 mLs by nebulization 3 (three) times [name] as needed (wheezing).     esomeprazole 40 MG capsule  Commonly known as:  NEXIUM  Take 40 mg by mouth [name].     furosemide 40 MG tablet  Commonly known as:  LASIX  Take 40 mg by mouth [name] as needed for fluid.     gabapentin 400 MG capsule  Commonly known as:  NEURONTIN  Take 400 mg by mouth 2 (two) times [name].     guaiFENesin 600 MG 12 hr tablet  Commonly known as:  MUCINEX  Take 2 tablets (1,200 mg total) by mouth 2 (two) times [name] as needed for cough or to loosen phlegm.     meclizine 50 MG tablet  Commonly known as:  ANTIVERT  Take 50 mg by mouth 2 (two) times [name] as needed for dizziness.     metFORMIN 500 MG tablet  Commonly known as:  GLUCOPHAGE  Take 250 mg by mouth [name] with breakfast.     metoCLOPramide 5 MG tablet  Commonly known as:  REGLAN  Take 5 mg by mouth 2 (two) times [name].     nicotine polacrilex 4 MG gum  Commonly known as:  NICORETTE  Take 4 mg by mouth as needed for smoking cessation.     nitroGLYCERIN 0.4 MG SL tablet  Commonly known as:  NITROSTAT  Place 0.4 mg under the tongue every 5 (five) minutes as needed for chest pain.     oxyCODONE 5 MG immediate release tablet  Commonly known as:  Oxy IR/ROXICODONE  Take 1-2 tablets (5-10 mg total) by mouth every 6 (six) hours as needed for severe pain.     potassium chloride 10 MEQ tablet  Commonly known as:  K-DUR  Take 10 mEq by mouth [name].     predniSONE 10 MG tablet  Commonly known  as:  DELTASONE  Take 20 mg by mouth [name] as needed. For gout per patient  PROBIOTIC [name] PO  Take 1 capsule by mouth [name] as needed.     sennosides-docusate sodium 8.6-50 MG tablet  Commonly known as:  SENOKOT-S  Take 1 tablet by mouth every other day.     sildenafil 100 MG tablet  Commonly known as:  VIAGRA  Take 100 mg by mouth [name] as needed for erectile dysfunction. One tablet 30 minutes before intercourse, maximum twice weekly     tamsulosin 0.4 MG Caps capsule  Commonly known as:  FLOMAX  Take 0.4 mg by mouth [name] as needed. For bladder per patient     tiotropium 18 MCG inhalation capsule  Commonly known as:  SPIRIVA  Place 18 mcg into inhaler and inhale [name] as needed (Shortness of breath).        Follow Up Appointments: Follow-up Information    Follow up with Melrose Nakayama, MD On 07/26/2015.   Specialty:  Cardiothoracic Surgery   Why:  PA/LAT CXR to be taken at [facility] (which is in the same building as Dr. Leonarda Salon office) on 07/26/2015 at 11:00 am ;Appontment time is at 11:45 am   Contact information:   [place] [address] [phone]       Follow up with [facility]   Why:  Resume HHPT    Contact information:   [place] [address] [phone]       Follow up with Celedonio Savage, MD.   Specialty:  Family Medicine   Why:  Call for a follow up appointment regarding further blood pressure and diabetes management   Contact information:   [address] [place] [address] [phone]       Signed: ZIMMERMAN,DONIELLE MPA-C 07/14/2015, 8:30 AM

## 2015-07-07 NOTE — Progress Notes (Signed)
Pt bladder scan revealed 519m in bladder.  In/out cath produced 5025mamber urine.  Post bladder scan was 0.  This is the second in/out cath since d/c of foley cath.  Will continue to monitor and follow protocol.

## 2015-07-07 NOTE — Progress Notes (Signed)
Nurse paged me to say patient went into a fib with HR into 120's+. As she was rechecking his blood pressure, he converted to sinus rhythm. I gave her orders to give Amiodarone 150 mg bolus followed by 400 mg bid. If his rate becomes uncontrolled again, I will put him on an Amiodarone drip. He is on Atenolol 50 mg daily (as taken pre op).

## 2015-07-07 NOTE — Care Management Important Message (Signed)
Important Message  Patient Details  Name: Jonathan [name] MRN: [id number] Date of Birth: 06/23/1940   Medicare Important Message Given:  Yes    Nathen May 07/07/2015, 10:57 AM

## 2015-07-08 ENCOUNTER — Inpatient Hospital Stay (HOSPITAL_COMMUNITY): Payer: Medicare Other

## 2015-07-08 LAB — GLUCOSE, CAPILLARY
Glucose-Capillary: 129 mg/dL — ABNORMAL HIGH (ref 65–99)
Glucose-Capillary: 111 mg/dL — ABNORMAL HIGH (ref 65–99)
Glucose-Capillary: 107 mg/dL — ABNORMAL HIGH (ref 65–99)
Glucose-Capillary: 106 mg/dL — ABNORMAL HIGH (ref 65–99)

## 2015-07-08 LAB — BASIC METABOLIC PANEL
Anion gap: 10 (ref 5–15)
BUN: 12 mg/dL (ref 6–20)
CO2: 30 mmol/L (ref 22–32)
Calcium: 8.7 mg/dL — ABNORMAL LOW (ref 8.9–10.3)
Chloride: 97 mmol/L — ABNORMAL LOW (ref 101–111)
Creatinine, Ser: 1.18 mg/dL (ref 0.61–1.24)
GFR calc Af Amer: >60 mL/min (ref >60)
GFR calc non Af Amer: 59 mL/min — ABNORMAL LOW (ref >60)
Glucose, Bld: 123 mg/dL — ABNORMAL HIGH (ref 65–99)
Potassium: 3.8 mmol/L (ref 3.5–5.1)
Sodium: 137 mmol/L (ref 135–145)

## 2015-07-08 MED ORDER — GUAIFENESIN ER 600 MG PO TB12
1200.0000 mg | ORAL_TABLET | Freq: Two times a day (BID) | ORAL | Status: DC
  Administered 2015-07-08 – 2015-07-14 (×13): 1200 mg via ORAL
  Filled 2015-07-08 (×13): qty 2

## 2015-07-08 MED ORDER — AMIODARONE HCL 200 MG PO TABS
400.0000 mg | ORAL_TABLET | Freq: Two times a day (BID) | ORAL | Status: DC
  Administered 2015-07-08 – 2015-07-13 (×12): 400 mg via ORAL
  Filled 2015-07-08 (×12): qty 2

## 2015-07-08 MED ORDER — ATENOLOL 25 MG PO TABS
25.0000 mg | ORAL_TABLET | Freq: Every day | ORAL | Status: DC
  Administered 2015-07-08 – 2015-07-13 (×6): 25 mg via ORAL
  Filled 2015-07-08 (×6): qty 1

## 2015-07-08 MED ORDER — INSULIN ASPART 100 UNIT/ML ~~LOC~~ SOLN
0.0000 [IU] | Freq: Three times a day (TID) | SUBCUTANEOUS | Status: DC
  Administered 2015-07-12: 1 [IU] via SUBCUTANEOUS

## 2015-07-08 NOTE — Progress Notes (Signed)
Patient interacting with friend, Earlie Server.  Patient requesting peanut butter and crackers; items provided.

## 2015-07-08 NOTE — Progress Notes (Signed)
PT Cancellation Note  Patient Details Name: Jonathan [name] MRN: [id number] DOB: [birth year]   Cancelled Treatment:    Reason Eval/Treat Not Completed: Patient declined, no reason specified.  Pt refuses to work w/ therapy at this time as he is insistent on calling his friend to bring him a cushion for his bed.  PT will continue to follow acutely and will attempt to see pt later today, time permitting.  Collie Siad PT, DPT  Pager: [phone] Phone: [phone] 07/08/2015, 9:38 AM

## 2015-07-08 NOTE — Progress Notes (Signed)
Patient keeps repeating he wants to "throw the towel in, that is all you can do, so many things went wrong, let it go".  Earlier this morning he stated he wished he "had not had the surgery" and that he felt like he was "going to die".  When asked if he wanted to "give up", he stated "might as well, so I can go on to heaven".

## 2015-07-08 NOTE — Progress Notes (Signed)
Unable to ambulate patient today as he has been very sleepy and refused to move or participate in activities.

## 2015-07-08 NOTE — Progress Notes (Signed)
4 Days Post-Op Procedure(s) (LRB): VIDEO ASSISTED THORACOSCOPY (VATS)/ RIGHT UPPER LOBECTOMY (Right) Subjective: C/o feeling "groggy", noted by RN to be lethargic  Objective: Vital signs in last 24 hours: Temp:  [97.1 F (36.2 C)-97.8 F (36.6 C)] 97.7 F (36.5 C) (04/14 0458) Pulse Rate:  [72-141] 75 (04/14 0752) Cardiac Rhythm:  [-] Normal sinus rhythm (04/14 0754) Resp:  [14-37] 20 (04/14 0752) BP: (85-117)/(48-91) 93/62 mmHg (04/14 0752) SpO2:  [94 %-100 %] 94 % (04/14 0752)  Hemodynamic parameters for last 24 hours:    Intake/Output from previous day: 04/13 0701 - 04/14 0700 In: 287 [I.V.:287] Out: 4105 [Urine:3765; Chest Tube:340] Intake/Output this shift:    General appearance: lethargic but no focal deficit Neurologic: no focal deficit Heart: regular rate and rhythm Lungs: diminished breath sounds bibasilar Abdomen: normal findings: soft, non-tender small air leak  Lab Results:  Recent Labs  07/06/15 0351 07/07/15 0401  WBC 12.4* 9.3  HGB 12.2* 11.8*  HCT 37.3* 37.0*  PLT 144* 161   BMET:  Recent Labs  07/07/15 0401 07/08/15 0631  NA 137 137  K 3.7 3.8  CL 97* 97*  CO2 30 30  GLUCOSE 106* 123*  BUN 10 12  CREATININE 1.23 1.18  CALCIUM 8.6* 8.7*    PT/INR: No results for input(s): LABPROT, INR in the last 72 hours. ABG    Component Value Date/Time   PHART 7.337* 07/05/2015 0500   HCO3 28.4* 07/05/2015 0500   TCO2 30.1 07/05/2015 0500   O2SAT 96.5 07/05/2015 0500   CBG (last 3)   Recent Labs  07/07/15 1504 07/07/15 1713 07/07/15 2114  GLUCAP 120* 102* 115*    Assessment/Plan: S/P Procedure(s) (LRB): VIDEO ASSISTED THORACOSCOPY (VATS)/ RIGHT UPPER LOBECTOMY (Right) -  POD # 4 lobectomy  CV- atrial fib yesterday- converted to SR with amiodarone- convert to PO  BP low- decrease atenolol dose to 25  RESP- s/p lobectomy- still has an air leak but poor cough  Small pneumothorax on CXR- will dc anterior tube and place posterior  tube back to suction  RENAL- creatinine normal  ENDO- CBG well controlled, he says he feels lethargic if CBG < 130, will decrease sliding scale to sensitive  Enoxaparin + SCD for DVT prophylaxis   LOS: 4 days    Jonathan [name] 07/08/2015

## 2015-07-08 NOTE — Progress Notes (Signed)
Pt converted to sinus rhythm.  EKG obtained to verify rate conversion.

## 2015-07-08 NOTE — Progress Notes (Signed)
Right anterior chest tube removed.  Patient tolerated well.  Sterile gauze dressing applied and taped in place.

## 2015-07-08 NOTE — Progress Notes (Signed)
PT Cancellation Note  Patient Details Name: Jonathan [name] MRN: [id number] DOB: 09/26/1940   Cancelled Treatment:    Reason Eval/Treat Not Completed: Patient at procedure or test/unavailable.  Pt has had chest tube removed.  Will await chest x-ray before mobilizing.    Collie Siad PT, DPT  Pager: [phone] Phone: [phone] 07/08/2015, 11:21 AM

## 2015-07-09 ENCOUNTER — Inpatient Hospital Stay (HOSPITAL_COMMUNITY): Payer: Medicare Other

## 2015-07-09 LAB — GLUCOSE, CAPILLARY
Glucose-Capillary: 105 mg/dL — ABNORMAL HIGH (ref 65–99)
Glucose-Capillary: 99 mg/dL (ref 65–99)
Glucose-Capillary: 110 mg/dL — ABNORMAL HIGH (ref 65–99)

## 2015-07-09 NOTE — Evaluation (Signed)
Physical Therapy Evaluation Patient Details Name: Jonathan [name] MRN: [id number] DOB: [birth year] Today's Date: 07/09/2015   History of Present Illness  Patient is a 75 yo male admitted 07/04/15 with Bilateral upper lobe lung cancer for VATS/Rt upper lobectomy.  Patient having Afib.  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  Will benefit from acute PT to maximize functional mobility prior to discharge home.  Recommend HHPT at discharge for continued therapy.    Follow Up Recommendations Home health PT;Supervision for mobility/OOB    Equipment Recommendations  None recommended by PT    Recommendations for Other Services       Precautions / Restrictions Precautions Precautions: Fall Precaution Comments: Rt chest tube in place Restrictions Weight Bearing Restrictions: No      Mobility  Bed Mobility Overal bed mobility: Needs Assistance Bed Mobility: Supine to Sit;Sit to Supine     Supine to sit: Min guard Sit to supine: Min guard   General bed mobility comments: Min guard for safety.  Assist to don Rt boot  Transfers Overall transfer level: Needs assistance Equipment used: Rolling walker (2 wheeled) Transfers: Sit to/from Stand Sit to Stand: Min assist;From elevated surface         General transfer comment: Elevated bed.  Assist to power up to standing.  Ambulation/Gait Ambulation/Gait assistance: Min guard Ambulation Distance (Feet): 300 Feet Assistive device: Rolling walker (2 wheeled) Gait Pattern/deviations: Step-through pattern;Decreased stride length;Trunk flexed Gait velocity: decreased Gait velocity interpretation: Below normal speed for age/gender General Gait Details: Patient with slow steady gait.  Cues to stand upright and look forward during gait.  Stairs            Wheelchair Mobility    Modified Rankin  (Stroke Patients Only)       Balance                                             Pertinent Vitals/Pain Pain Assessment: 0-10 Pain Score: 10-Worst pain ever Pain Location: Rt flank Pain Descriptors / Indicators: Sharp;Sore Pain Intervention(s): Monitored during session;Repositioned    Home Living Family/patient expects to be discharged to:: Private residence Living Arrangements: Spouse/significant other (Reports fiance lives with him) Available Help at Discharge: Family;Friend(s);Available 24 hours/day Type of Home: House Home Access: Ramped entrance     Home Layout: One level Home Equipment: Walker - 2 wheels;Bedside commode;Electric scooter;Wheelchair - power;Shower seat      Prior Function Level of Independence: Independent               Hand Dominance        Extremity/Trunk Assessment   Upper Extremity Assessment: Generalized weakness (Difficult to assess with pain/chest tube)           Lower Extremity Assessment: Generalized weakness;RLE deficits/detail RLE Deficits / Details: Total ankle replacement 03/2015       Communication   Communication: No difficulties  Cognition Arousal/Alertness: Awake/alert Behavior During Therapy: WFL for tasks assessed/performed Overall Cognitive Status: Within Functional Limits for tasks assessed                      General Comments      Exercises        Assessment/Plan    PT Assessment Patient needs continued  PT services  PT Diagnosis Difficulty walking;Generalized weakness;Acute pain   PT Problem List Decreased strength;Decreased balance;Decreased mobility;Decreased knowledge of use of DME;Decreased safety awareness;Cardiopulmonary status limiting activity;Pain  PT Treatment Interventions DME instruction;Gait training;Functional mobility training;Therapeutic activities;Patient/family education   PT Goals (Current goals can be found in the Care Plan section) Acute Rehab PT  Goals Patient Stated Goal: To go home PT Goal Formulation: With patient Time For Goal Achievement: 07/16/15 Potential to Achieve Goals: Good    Frequency Min 3X/week   Barriers to discharge        Co-evaluation               End of Session Equipment Utilized During Treatment:  (No gait belt due to chest tube/VATS) Activity Tolerance: Patient tolerated treatment well Patient left: in bed;with call bell/phone within reach;with bed alarm set Nurse Communication: Mobility status         Time: 2500-3704 PT Time Calculation (min) (ACUTE ONLY): 31 min   Charges:   PT Evaluation $PT Eval High Complexity: 1 Procedure PT Treatments $Gait Training: 8-22 mins   PT G CodesDespina Pole 2015-07-27, 8:08 PM Carita Pian. Sanjuana Kava, [place] Pager [phone]

## 2015-07-09 NOTE — Progress Notes (Addendum)
Jonathan [name] [address]       [place] [address]             [phone]      5 Days Post-Op Procedure(s) (LRB): VIDEO ASSISTED THORACOSCOPY (VATS)/ RIGHT UPPER LOBECTOMY (Right) Subjective: Feels fair, some nausea  Objective: Vital signs in last 24 hours: Temp:  [97.6 F (36.4 C)-98.2 F (36.8 C)] 98 F (36.7 C) (04/15 0751) Pulse Rate:  [67-82] 72 (04/15 0751) Cardiac Rhythm:  [-] Normal sinus rhythm (04/15 0811) Resp:  [13-18] 18 (04/15 0751) BP: (92-107)/(56-74) 106/74 mmHg (04/15 0751) SpO2:  [94 %-100 %] 94 % (04/15 0751) FiO2 (%):  [21 %] 21 % (04/15 0738)  Hemodynamic parameters for last 24 hours:    Intake/Output from previous day: 04/14 0701 - 04/15 0700 In: 360 [P.O.:360] Out: 1325 [Urine:1025; Chest Tube:300] Intake/Output this shift:    General appearance: alert, cooperative and no distress Heart: regular rate and rhythm Lungs: clear to auscultation bilaterally Abdomen: benign Extremities: min edema Wound: healing well  Lab Results:  Recent Labs  07/07/15 0401  WBC 9.3  HGB 11.8*  HCT 37.0*  PLT 161   BMET:  Recent Labs  07/07/15 0401 07/08/15 0631  NA 137 137  K 3.7 3.8  CL 97* 97*  CO2 30 30  GLUCOSE 106* 123*  BUN 10 12  CREATININE 1.23 1.18  CALCIUM 8.6* 8.7*    PT/INR: No results for input(s): LABPROT, INR in the last 72 hours. ABG    Component Value Date/Time   PHART 7.337* 07/05/2015 0500   HCO3 28.4* 07/05/2015 0500   TCO2 30.1 07/05/2015 0500   O2SAT 96.5 07/05/2015 0500   CBG (last 3)   Recent Labs  07/08/15 1659 07/08/15 2131 07/09/15 0747  GLUCAP 107* 106* 105*    Meds Scheduled Meds: . amiodarone  400 mg Oral BID  . atenolol  25 mg Oral QHS  . bisacodyl  10 mg Oral Daily  . enoxaparin (LOVENOX) injection  40 mg Subcutaneous Daily  . guaiFENesin  1,200 mg Oral BID  . insulin aspart  0-9 Units Subcutaneous TID WC  . levalbuterol  0.63 mg Nebulization QID  . metoCLOPramide  5 mg Oral BID    . pantoprazole  40 mg Oral Daily  . potassium chloride  10 mEq Oral Daily  . senna-docusate  1 tablet Oral QODAY  . tamsulosin  0.4 mg Oral Daily  . tiotropium  18 mcg Inhalation Daily   Continuous Infusions: . dextrose 5% lactated ringers Stopped (07/08/15 1900)   PRN Meds:.meclizine, metoprolol, mupirocin cream, nicotine polacrilex, nitroGLYCERIN, ondansetron (ZOFRAN) IV, oxyCODONE, potassium chloride  Xrays Dg Chest Port 1 View  07/09/2015  CLINICAL DATA:  75 year old male with history of right upper lobectomy. EXAM: PORTABLE CHEST 1 VIEW COMPARISON:  Multiple priors, most recently chest x-ray 07/08/2015. FINDINGS: Status post right upper lobectomy. Interval removal of 1 of the previously noted right-sided chest tubes. The other right-sided chest tube remains in position with tip near the apex of the right hemithorax. There continues to be a small to moderate right apical pneumothorax which is grossly stable in size (roughly 20% of the volume of the right hemithorax). Ill-defined interstitial opacities are noted throughout the lungs bilaterally, particularly in the remaining right middle and lower lobes. No definite pleural effusions. No evidence of pulmonary edema. Heart size is normal. Mediastinal contours are slightly distorted by patient positioning. IMPRESSION: 1. Postoperative changes of right upper lobectomy with support apparatus,  as above. 2. Unchanged small to moderate right apical pneumothorax. 3. Worsening ill-defined interstitial opacities throughout the lungs bilaterally, particularly in the remaining right middle and lower lobes. This is of uncertain etiology and significance, but clinical correlation for signs of developing infection is suggested. Electronically Signed   By: Jonathan [name] M.D.   On: 07/09/2015 09:24   Dg Chest Port 1 View  07/08/2015  CLINICAL DATA:  Follow-up right pneumothorax EXAM: PORTABLE CHEST 1 VIEW COMPARISON:  Portable chest x-ray of July 07, 2015  FINDINGS: The right-sided pneumothorax has increased in volume and now amounts to approximately 20% of the lung volume. The 2 right-sided chest tubes are in stable position. There is no mediastinal shift. The left lung is clear. There is no pleural effusion. There is stable soft tissue fullness in the right hilar region. The heart is normal in size. IMPRESSION: Interval increase in the size of the right-sided pneumothorax. The chest tubes are in stable position. Critical Value/emergent results were called by telephone at the time of interpretation on 07/08/2015 at 8:21 am to Jonathan Riles, RN, who verbally acknowledged these results. Electronically Signed   By: Jonathan  [name] M.D.   On: 07/08/2015 08:22    Assessment/Plan: S/P Procedure(s) (LRB): VIDEO ASSISTED THORACOSCOPY (VATS)/ RIGHT UPPER LOBECTOMY (Right)  1 pneumotx/space is stable- no air leak , keep CT in place on suction for now 2 sinus rhythm, on amiodarone/beta blocker 3 interstitial findings on CXR always concern me for amiodarone toxicity , will have to monitor closely- he is afebrile and not requiring O2 currently 4  voiding trial  LOS: 5 days    [name],[name] 07/09/2015  Air leak with cough 10 % PTX , leave chest tube to suction for now I have seen and examined Jonathan [name] and agree with the above assessment  and plan.  Jonathan Isaac MD Beeper [phone] Office [phone] 07/09/2015 11:53 AM

## 2015-07-10 ENCOUNTER — Inpatient Hospital Stay (HOSPITAL_COMMUNITY): Payer: Medicare Other

## 2015-07-10 LAB — GLUCOSE, CAPILLARY
Glucose-Capillary: 117 mg/dL — ABNORMAL HIGH (ref 65–99)
Glucose-Capillary: 119 mg/dL — ABNORMAL HIGH (ref 65–99)
Glucose-Capillary: 98 mg/dL (ref 65–99)
Glucose-Capillary: 109 mg/dL — ABNORMAL HIGH (ref 65–99)
Glucose-Capillary: 120 mg/dL — ABNORMAL HIGH (ref 65–99)

## 2015-07-10 MED ORDER — LEVALBUTEROL HCL 0.63 MG/3ML IN NEBU: 0.6300 mg | INHALATION_SOLUTION | Freq: Four times a day (QID) | RESPIRATORY_TRACT | Status: DC | PRN

## 2015-07-10 MED ORDER — LEVALBUTEROL HCL 0.63 MG/3ML IN NEBU
0.6300 mg | INHALATION_SOLUTION | Freq: Three times a day (TID) | RESPIRATORY_TRACT | Status: DC
  Administered 2015-07-10 – 2015-07-12 (×8): 0.63 mg via RESPIRATORY_TRACT
  Filled 2015-07-10 (×9): qty 3

## 2015-07-10 NOTE — Progress Notes (Addendum)
[address]       [place] [address]             [phone]      6 Days Post-Op Procedure(s) (LRB): VIDEO ASSISTED THORACOSCOPY (VATS)/ RIGHT UPPER LOBECTOMY (Right) Subjective: Having incisional soreness. Now has moderate sixed air leak  Objective: Vital signs in last 24 hours: Temp:  [97.7 F (36.5 C)-98.7 F (37.1 C)] 98 F (36.7 C) (04/16 0726) Pulse Rate:  [76] 76 (04/15 2055) Cardiac Rhythm:  [-] Normal sinus rhythm (04/16 0802) Resp:  [10-22] 17 (04/16 0729) BP: (104-123)/(65-81) 116/81 mmHg (04/16 0729) SpO2:  [94 %-99 %] 96 % (04/16 0500) FiO2 (%):  [21 %] 21 % (04/15 1530)  Hemodynamic parameters for last 24 hours:    Intake/Output from previous day: 04/15 0701 - 04/16 0700 In: 30 [P.O.:960] Out: 995 [Urine:800; Chest Tube:195] Intake/Output this shift: Total I/O In: -  Out: 520 [Urine:450; Chest Tube:70]  General appearance: alert, cooperative and no distress Heart: regular rate and rhythm Lungs: dim in lower fields Abdomen: benign Extremities: min edema Wound: incis healing well  Lab Results: No results for input(s): WBC, HGB, HCT, PLT in the last 72 hours. BMET:   Recent Labs  07/08/15 0631  NA 137  K 3.8  CL 97*  CO2 30  GLUCOSE 123*  BUN 12  CREATININE 1.18  CALCIUM 8.7*    PT/INR: No results for input(s): LABPROT, INR in the last 72 hours. ABG    Component Value Date/Time   PHART 7.337* 07/05/2015 0500   HCO3 28.4* 07/05/2015 0500   TCO2 30.1 07/05/2015 0500   O2SAT 96.5 07/05/2015 0500   CBG (last 3)   Recent Labs  07/09/15 1308 07/09/15 1655 07/09/15 2120  GLUCAP 99 110* 117*    Meds Scheduled Meds: . amiodarone  400 mg Oral BID  . atenolol  25 mg Oral QHS  . bisacodyl  10 mg Oral Daily  . enoxaparin (LOVENOX) injection  40 mg Subcutaneous Daily  . guaiFENesin  1,200 mg Oral BID  . insulin aspart  0-9 Units Subcutaneous TID WC  . levalbuterol  0.63 mg Nebulization TID  . metoCLOPramide   5 mg Oral BID  . pantoprazole  40 mg Oral Daily  . potassium chloride  10 mEq Oral Daily  . senna-docusate  1 tablet Oral QODAY  . tamsulosin  0.4 mg Oral Daily  . tiotropium  18 mcg Inhalation Daily   Continuous Infusions: . dextrose 5% lactated ringers Stopped (07/08/15 1900)   PRN Meds:.levalbuterol, meclizine, metoprolol, mupirocin cream, nicotine polacrilex, nitroGLYCERIN, ondansetron (ZOFRAN) IV, oxyCODONE, potassium chloride  Xrays Dg Chest Port 1 View  07/10/2015  CLINICAL DATA:  75 year old male with a history of thoracic surgery. Status post right-sided VATS with right upper lobectomy, mediastinal lymph node dissection. Performed 07/04/2015. EXAM: PORTABLE CHEST 1 VIEW COMPARISON:  07/09/2015, 07/08/2015 FINDINGS: Cardiomediastinal silhouette unchanged. Atherosclerotic calcifications of the aortic arch. Unchanged right-sided thoracostomy tube. Persisting small right pneumothorax. Mixed interstitial opacities bilaterally, similar to the comparison. No pleural effusion. IMPRESSION: Persisting small right pneumothorax with unchanged thoracostomy tube, status post right upper lobectomy. Mixed interstitial opacities bilaterally, potentially combination of atelectasis, edema, or developing infection. Signed, Dulcy Fanny. Earleen Newport, DO [facility] Electronically Signed   By: Corrie Mckusick D.O.   On: 07/10/2015 10:15   Dg Chest Port 1 View  07/09/2015  CLINICAL DATA:  75 year old male with history of right upper lobectomy. EXAM:  PORTABLE CHEST 1 VIEW COMPARISON:  Multiple priors, most recently chest x-ray 07/08/2015. FINDINGS: Status post right upper lobectomy. Interval removal of 1 of the previously noted right-sided chest tubes. The other right-sided chest tube remains in position with tip near the apex of the right hemithorax. There continues to be a small to moderate right apical pneumothorax which is grossly stable in size (roughly 20% of  the volume of the right hemithorax). Ill-defined interstitial opacities are noted throughout the lungs bilaterally, particularly in the remaining right middle and lower lobes. No definite pleural effusions. No evidence of pulmonary edema. Heart size is normal. Mediastinal contours are slightly distorted by patient positioning. IMPRESSION: 1. Postoperative changes of right upper lobectomy with support apparatus, as above. 2. Unchanged small to moderate right apical pneumothorax. 3. Worsening ill-defined interstitial opacities throughout the lungs bilaterally, particularly in the remaining right middle and lower lobes. This is of uncertain etiology and significance, but clinical correlation for signs of developing infection is suggested. Electronically Signed   By: Vinnie Langton M.D.   On: 07/09/2015 09:24    Assessment/Plan: S/P Procedure(s) (LRB): VIDEO ASSISTED THORACOSCOPY (VATS)/ RIGHT UPPER LOBECTOMY (Right)  1 stable 2 keep CT for air leak/ small pntx-air space 3 CXR appearance is fairly stable- radiology reading pending  4 UO is good 5 push rehab as able 6 rhythm is stable- check cmet in am to check renal and hepatic function for changes   LOS: 6 days    Persisting small right pneumothorax with  thoracostomy tube in place, small air leak  Mixed interstitial opacities bilaterally,  No fever or chill to suggest developing infection, check wbc in am  Leave chest tube in place   Grace Isaac 07/10/2015

## 2015-07-11 ENCOUNTER — Inpatient Hospital Stay (HOSPITAL_COMMUNITY): Payer: Medicare Other

## 2015-07-11 LAB — COMPREHENSIVE METABOLIC PANEL
ALT: 22 U/L (ref 17–63)
AST: 27 U/L (ref 15–41)
Albumin: 2.5 g/dL — ABNORMAL LOW (ref 3.5–5.0)
Alkaline Phosphatase: 53 U/L (ref 38–126)
Anion gap: 12 (ref 5–15)
BUN: 16 mg/dL (ref 6–20)
CO2: 26 mmol/L (ref 22–32)
Calcium: 8.5 mg/dL — ABNORMAL LOW (ref 8.9–10.3)
Chloride: 98 mmol/L — ABNORMAL LOW (ref 101–111)
Creatinine, Ser: 1.26 mg/dL — ABNORMAL HIGH (ref 0.61–1.24)
GFR calc Af Amer: >60 mL/min (ref >60)
GFR calc non Af Amer: 55 mL/min — ABNORMAL LOW (ref >60)
Glucose, Bld: 106 mg/dL — ABNORMAL HIGH (ref 65–99)
Potassium: 3.9 mmol/L (ref 3.5–5.1)
Sodium: 136 mmol/L (ref 135–145)
Total Bilirubin: 0.9 mg/dL (ref 0.3–1.2)
Total Protein: 6.7 g/dL (ref 6.5–8.1)

## 2015-07-11 LAB — GLUCOSE, CAPILLARY
Glucose-Capillary: 99 mg/dL (ref 65–99)
Glucose-Capillary: 97 mg/dL (ref 65–99)
Glucose-Capillary: 106 mg/dL — ABNORMAL HIGH (ref 65–99)
Glucose-Capillary: 136 mg/dL — ABNORMAL HIGH (ref 65–99)

## 2015-07-11 MED ORDER — ENSURE ENLIVE PO LIQD
237.0000 mL | Freq: Two times a day (BID) | ORAL | Status: DC
  Administered 2015-07-11: 237 mL via ORAL

## 2015-07-11 MED ORDER — TRAMADOL HCL 50 MG PO TABS: 50.0000 mg | ORAL_TABLET | Freq: Four times a day (QID) | ORAL | Status: DC | PRN

## 2015-07-11 MED ORDER — OXYCODONE HCL 5 MG PO TABS: 10.0000 mg | ORAL_TABLET | ORAL | Status: DC | PRN

## 2015-07-11 MED ORDER — OXYCODONE HCL 5 MG PO TABS
10.0000 mg | ORAL_TABLET | Freq: Four times a day (QID) | ORAL | Status: DC | PRN
  Administered 2015-07-11 – 2015-07-12 (×3): 5 mg via ORAL
  Administered 2015-07-12: 10 mg via ORAL
  Administered 2015-07-13 (×2): 5 mg via ORAL
  Filled 2015-07-11 (×7): qty 2

## 2015-07-11 NOTE — Progress Notes (Signed)
7 Days Post-Op Procedure(s) (LRB): VIDEO ASSISTED THORACOSCOPY (VATS)/ RIGHT UPPER LOBECTOMY (Right) Subjective: C/o pain at chest tube site Otherwise feels well  Objective: Vital signs in last 24 hours: Temp:  [98.4 F (36.9 C)-99.3 F (37.4 C)] 98.5 F (36.9 C) (04/17 0414) Pulse Rate:  [56-77] 56 (04/17 0420) Cardiac Rhythm:  [-] Normal sinus rhythm (04/17 0420) Resp:  [12-15] 15 (04/17 0420) BP: (95-117)/(51-72) 101/61 mmHg (04/17 0420) SpO2:  [92 %-96 %] 96 % (04/17 0420)  Hemodynamic parameters for last 24 hours:    Intake/Output from previous day: 04/16 0701 - 04/17 0700 In: 360 [P.O.:360] Out: 1790 [Urine:1550; Chest Tube:240] Intake/Output this shift:    General appearance: alert, cooperative and no distress Neurologic: intact Heart: regular rate and rhythm Lungs: diminished breath sounds right base Wound: clean and dry  Lab Results: No results for input(s): WBC, HGB, HCT, PLT in the last 72 hours. BMET:  Recent Labs  07/11/15 0430  NA 136  K 3.9  CL 98*  CO2 26  GLUCOSE 106*  BUN 16  CREATININE 1.26*  CALCIUM 8.5*    PT/INR: No results for input(s): LABPROT, INR in the last 72 hours. ABG    Component Value Date/Time   PHART 7.337* 07/05/2015 0500   HCO3 28.4* 07/05/2015 0500   TCO2 30.1 07/05/2015 0500   O2SAT 96.5 07/05/2015 0500   CBG (last 3)   Recent Labs  07/10/15 1209 07/10/15 1621 07/10/15 2112  GLUCAP 98 109* 120*    Assessment/Plan: S/P Procedure(s) (LRB): VIDEO ASSISTED THORACOSCOPY (VATS)/ RIGHT UPPER LOBECTOMY (Right) POD # 7 lobectomy  still has an air leak, lung well expanded on CXR this AM Will try on water seal again today to see if lung will stay up May need endobronchial valve if leak persists Creatinine up slightly - recheck in AM   LOS: 7 days    Jonathan [name] 07/11/2015

## 2015-07-11 NOTE — Progress Notes (Signed)
PT Cancellation Note  Patient Details Name: Jonathan [name] MRN: [id number] DOB: 01/13/1941   Cancelled Treatment:    Reason Eval/Treat Not Completed: Fatigue/lethargy limiting ability to participate. Pt reports having just returned to bed after ambulating with nursing. Pt observed ambulating in hallway with RW and supervision of RN. PT to re-attempt this afternoon or tomorrow.   Lorriane Shire 07/11/2015, 11:14 AM

## 2015-07-11 NOTE — Progress Notes (Signed)
Physical Therapy Treatment Patient Details Name: Jonathan [name] MRN: [id number] DOB: [birth year] Today's Date: 07/11/2015    History of Present Illness Patient is a 75 yo male admitted 07/04/15 with Bilateral upper lobe lung cancer for VATS/Rt upper lobectomy.  Patient having Afib.  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 Amb with nursing earlier but would only amb back to bed from bathroom with me.  Follow Up Recommendations  Home health PT;Supervision for mobility/OOB     Equipment Recommendations  None recommended by PT    Recommendations for Other Services       Precautions / Restrictions Precautions Precautions: Fall Precaution Comments: Rt chest tube in place Restrictions Weight Bearing Restrictions: No    Mobility  Bed Mobility Overal bed mobility: Needs Assistance Bed Mobility: Sit to Supine       Sit to supine: Supervision   General bed mobility comments: supervision for lines only  Transfers Overall transfer level: Needs assistance Equipment used: Rolling walker (2 wheeled) Transfers: Sit to/from Stand Sit to Stand: Mod assist         General transfer comment: Assist to bring hips up. Pt coming up from low commode so he required more assistance.  Ambulation/Gait Ambulation/Gait assistance: Supervision Ambulation Distance (Feet): 15 Feet Assistive device: Rolling walker (2 wheeled) Gait Pattern/deviations: Step-to pattern;Decreased stride length;Trunk flexed Gait velocity: decreased Gait velocity interpretation: Below normal speed for age/gender General Gait Details: Patient self limiting with distance. Would only walk from bathroom back to bed. After amb back to bed adjusted height of walker to encourage more upright posture.   Stairs            Wheelchair Mobility    Modified Rankin (Stroke  Patients Only)       Balance Overall balance assessment: Needs assistance Sitting-balance support: No upper extremity supported;Feet supported Sitting balance-Leahy Scale: Good     Standing balance support: Bilateral upper extremity supported Standing balance-Leahy Scale: Poor Standing balance comment: support of walker                    Cognition Arousal/Alertness: Awake/alert Behavior During Therapy: WFL for tasks assessed/performed Overall Cognitive Status: Within Functional Limits for tasks assessed                      Exercises      General Comments        Pertinent Vitals/Pain      Home Living                      Prior Function            PT Goals (current goals can now be found in the care plan section) Acute Rehab PT Goals Patient Stated Goal: To go home Progress towards PT goals: Not progressing toward goals - comment (Pt self limiting)    Frequency  Min 3X/week    PT Plan Current plan remains appropriate    Co-evaluation             End of Session Equipment Utilized During Treatment:  (No gait belt due to chest tube/VATS) Activity Tolerance: Other (comment) (Pt self limiting) Patient left: in bed;with call bell/phone within reach     Time: 3762-8315 PT Time Calculation (min) (ACUTE ONLY): 16 min  Charges:  $  Gait Training: 8-22 mins                    G Codes:      Jonathan [name] 07/23/2015, 2:33 PM [facility] PT [phone]

## 2015-07-12 ENCOUNTER — Inpatient Hospital Stay (HOSPITAL_COMMUNITY): Payer: Medicare Other

## 2015-07-12 LAB — GLUCOSE, CAPILLARY
Glucose-Capillary: 103 mg/dL — ABNORMAL HIGH (ref 65–99)
Glucose-Capillary: 122 mg/dL — ABNORMAL HIGH (ref 65–99)
Glucose-Capillary: 104 mg/dL — ABNORMAL HIGH (ref 65–99)
Glucose-Capillary: 108 mg/dL — ABNORMAL HIGH (ref 65–99)

## 2015-07-12 LAB — BASIC METABOLIC PANEL
Anion gap: 12 (ref 5–15)
BUN: 17 mg/dL (ref 6–20)
CO2: 26 mmol/L (ref 22–32)
Calcium: 8.7 mg/dL — ABNORMAL LOW (ref 8.9–10.3)
Chloride: 98 mmol/L — ABNORMAL LOW (ref 101–111)
Creatinine, Ser: 1.27 mg/dL — ABNORMAL HIGH (ref 0.61–1.24)
GFR calc Af Amer: >60 mL/min (ref >60)
GFR calc non Af Amer: 54 mL/min — ABNORMAL LOW (ref >60)
Glucose, Bld: 110 mg/dL — ABNORMAL HIGH (ref 65–99)
Potassium: 3.5 mmol/L (ref 3.5–5.1)
Sodium: 136 mmol/L (ref 135–145)

## 2015-07-12 MED ORDER — ENSURE ENLIVE PO LIQD: 237.0000 mL | Freq: Three times a day (TID) | ORAL | Status: DC | PRN

## 2015-07-12 NOTE — Progress Notes (Signed)
8 Days Post-Op Procedure(s) (LRB): VIDEO ASSISTED THORACOSCOPY (VATS)/ RIGHT UPPER LOBECTOMY (Right) Subjective: sore  Objective: Vital signs in last 24 hours: Temp:  [97.5 F (36.4 C)-99.5 F (37.5 C)] 98.2 F (36.8 C) (04/18 0749) Pulse Rate:  [72-80] 73 (04/18 0240) Cardiac Rhythm:  [-] Normal sinus rhythm (04/18 0240) Resp:  [13-18] 17 (04/18 0240) BP: (98-111)/(55-80) 104/71 mmHg (04/18 0240) SpO2:  [97 %-100 %] 100 % (04/18 0749)  Hemodynamic parameters for last 24 hours:    Intake/Output from previous day: 04/17 0701 - 04/18 0700 In: 600 [P.O.:600] Out: 430 [Urine:300; Chest Tube:130] Intake/Output this shift:    General appearance: alert, cooperative and no distress Neurologic: intact Heart: regular rate and rhythm Lungs: diminished breath sounds right base Wound: clean and dry  Lab Results: No results for input(s): WBC, HGB, HCT, PLT in the last 72 hours. BMET:  Recent Labs  07/11/15 0430 07/12/15 0506  NA 136 136  K 3.9 3.5  CL 98* 98*  CO2 26 26  GLUCOSE 106* 110*  BUN 16 17  CREATININE 1.26* 1.27*  CALCIUM 8.5* 8.7*    PT/INR: No results for input(s): LABPROT, INR in the last 72 hours. ABG    Component Value Date/Time   PHART 7.337* 07/05/2015 0500   HCO3 28.4* 07/05/2015 0500   TCO2 30.1 07/05/2015 0500   O2SAT 96.5 07/05/2015 0500   CBG (last 3)   Recent Labs  07/11/15 1703 07/11/15 2101 07/12/15 0734  GLUCAP 106* 136* 103*    Assessment/Plan: S/P Procedure(s) (LRB): VIDEO ASSISTED THORACOSCOPY (VATS)/ RIGHT UPPER LOBECTOMY (Right) -  Air leak significantly bettertoday. Only a few small bubbles with repeated cough CXR stable appearance Has been difficult to mobilize Creatinine stable   LOS: 8 days    Melrose Nakayama 07/12/2015

## 2015-07-12 NOTE — Progress Notes (Signed)
Initial Nutrition Assessment  DOCUMENTATION CODES:   Obesity unspecified  INTERVENTION:    Chocolate mighty shake TID between meals (500 kcal, 23 gm protein per serving)  Ensure Enlive po TID prn, each supplement provides 350 kcal and 20 grams of protein  NUTRITION DIAGNOSIS:   Inadequate oral intake related to poor appetite as evidenced by per patient/family report.  GOAL:   Patient will meet greater than or equal to 90% of their needs  MONITOR:   PO intake, Supplement acceptance, Labs  REASON FOR ASSESSMENT:   Malnutrition Screening Tool    ASSESSMENT:   75 yo male admitted 07/04/15 with bilateral upper lobe lung cancer S/P VATS/Rt upper lobectomy.S/P recentankle replacement.   RN reports that patient is eating poorly. He did not like the Ensure he was offered this AM. Patient reports poor appetite. He likes chocolate Ensure. Agreed to try chocolate Mighty Shake II TID with meals. Will continue Ensure, but change to prn. Nutrition focused physical exam completed.  No muscle or subcutaneous fat depletion noticed, mild-moderate fluid accumulation noted.  Diet Order:  Diet Heart Room service appropriate?: Yes; Fluid consistency:: Thin  Skin:  Reviewed, no issues  Last BM:  4/18  Height:   Ht Readings from Last 1 Encounters:  07/04/15 '6\' 8"'$  (2.032 m)    Weight:   Wt Readings from Last 1 Encounters:  07/04/15 275 lb 9.2 oz (125 kg)    Ideal Body Weight:  102.7 kg  BMI:  Body mass index is 30.27 kg/(m^2).  Estimated Nutritional Needs:   Kcal:  1497-0263  Protein:  125-150 gm  Fluid:  2.5 L  EDUCATION NEEDS:   No education needs identified at this time  Molli Barrows, [place], [place], [place] Pager [phone] After Hours Pager [phone]

## 2015-07-12 NOTE — Care Management Important Message (Signed)
Important Message  Patient Details  Name: Jonathan [name] MRN: [id number] Date of Birth: 10/06/1940   Medicare Important Message Given:  Yes    Kery Haltiwanger Abena 07/12/2015, 11:06 AM

## 2015-07-13 ENCOUNTER — Inpatient Hospital Stay (HOSPITAL_COMMUNITY): Payer: Medicare Other

## 2015-07-13 LAB — GLUCOSE, CAPILLARY
Glucose-Capillary: 91 mg/dL (ref 65–99)
Glucose-Capillary: 94 mg/dL (ref 65–99)
Glucose-Capillary: 108 mg/dL — ABNORMAL HIGH (ref 65–99)
Glucose-Capillary: 89 mg/dL (ref 65–99)

## 2015-07-13 MED ORDER — OXYCODONE HCL 5 MG PO TABS
5.0000 mg | ORAL_TABLET | Freq: Four times a day (QID) | ORAL | Status: DC | PRN
  Administered 2015-07-13: 5 mg via ORAL

## 2015-07-13 NOTE — Progress Notes (Addendum)
      [address]       [place] [id number]             [phone]       9 Days Post-Op Procedure(s) (LRB): VIDEO ASSISTED THORACOSCOPY (VATS)/ RIGHT UPPER LOBECTOMY (Right)  Subjective: Patient without specific complaints this am  Objective: Vital signs in last 24 hours: Temp:  [97.5 F (36.4 C)-98.4 F (36.9 C)] 97.5 F (36.4 C) (04/19 0311) Pulse Rate:  [59-72] 59 (04/19 0314) Cardiac Rhythm:  [-] Normal sinus rhythm (04/19 0314) Resp:  [12-21] 15 (04/19 0314) BP: (103-151)/(58-88) 103/60 mmHg (04/19 0314) SpO2:  [96 %-99 %] 96 % (04/19 0314)     Intake/Output from previous day: 04/18 0701 - 04/19 0700 In: 1200 [P.O.:1200] Out: 795 [Urine:675; Chest Tube:120]   Physical Exam:  Cardiovascular: RRR Pulmonary: Clear to auscultation left and slightly diminished right base. Abdomen: Soft, non tender, bowel sounds present. Extremities: Trace bilateral lower extremity edema. Wounds:  Clean and dry.   Chest Tubes: to water seal and no air leak   Lab Results: CBC: No results for input(s): WBC, HGB, HCT, PLT in the last 72 hours. BMET:   Recent Labs  07/11/15 0430 07/12/15 0506  NA 136 136  K 3.9 3.5  CL 98* 98*  CO2 26 26  GLUCOSE 106* 110*  BUN 16 17  CREATININE 1.26* 1.27*  CALCIUM 8.5* 8.7*    PT/INR: No results for input(s): LABPROT, INR in the last 72 hours. ABG:  INR: Will add last result for INR, ABG once components are confirmed Will add last 4 CBG results once components are confirmed  Assessment/Plan:  1. CV - Previous a fib. Maintaining SR. On Atenolol 25 mg at hs and Amiodarone 400 mg bid. 2.  Pulmonary - On room air. Chest tube with 120 cc of output last 24 hours. Chest tube is to water seal. There is no air leak. Hope to remove chest tube.CXR shows left lung is clear, stable right pneumothorax, right basilar atelectasis. Continue Spiriva. Mucinex for cough.Encourage incentive spirometer. Check CXR in am 5. DM-CBGs  122/104/108. On Insulin. Creatinine slightly decreased from 1.38 to 1.23 so will not restart Metformin yet. 6. If chest tube removed today, possible discharge 1-2 days   ZIMMERMAN,DONIELLE MPA-C 07/13/2015,8:32 AM   No air leak, chest x ray unchanged Dc chest tube Home tomorrow if continues to progress  Remo Lipps C. Roxan Hockey, MD [facility] [phone]

## 2015-07-13 NOTE — Care Management Note (Addendum)
Case Management Note  Patient Details  Name: KWEKU STANKEY MRN: [id number] Date of Birth: 09/05/1940  Subjective/Objective:      Patient has a friend who lives with him  And she will be transporting him home at discharge.  Pt eval rec hhpt, patient states he has hhpt with [facility], [phone] , NCM called there to confirm, they states patient has [facility] with [facility] (which is now [place])  , left message with rep Stanton Kidney with [facility] to confirm this information.  Awaiting call back. Mary with [facility] called back and said yes patient has HHPT with them, order in to resume HHPT.  Patient has a cane and a scooter at home.               Action/Plan:   Expected Discharge Date:                  Expected Discharge Plan:  [place]  In-House Referral:     Discharge planning Services  CM Consult  Post Acute Care Choice:    Choice offered to:  Patient  DME Arranged:    DME Agency:     HH Arranged:    [place] Agency:     Status of Service:  Completed, signed off  Medicare Important Message Given:  Yes Date Medicare IM Given:    Medicare IM give by:    Date Additional Medicare IM Given:    Additional Medicare Important Message give by:     If discussed at [place] of Stay Meetings, dates discussed:    Additional Comments:  Zenon Mayo, RN 07/13/2015, 4:30 PM

## 2015-07-13 NOTE — Progress Notes (Signed)
Physical Therapy Treatment and Discharge Patient Details Name: Jonathan [name] MRN: [id number] DOB: 07-31-1940 Today's Date: 07/13/2015    History of Present Illness Patient is a 75 yo male admitted 07/04/15 with Bilateral upper lobe lung cancer for VATS/Rt upper lobectomy.  Patient having Afib.  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 Ambulation limited during session due to fire alarm ringing during session and instructions from nursing staff to return patient to room. Pt moves well without assistance, but needed encouragement in order to perform tasks throughout session. Pt with safe mobility to return home, and acute therapy goals have been met. Will sign off.  Follow Up Recommendations  Home health PT;Supervision for mobility/OOB     Equipment Recommendations  None recommended by PT    Recommendations for Other Services       Precautions / Restrictions Precautions Precaution Comments: Rt chest tube in place Restrictions Weight Bearing Restrictions: Yes RUE Weight Bearing: Weight bearing as tolerated    Mobility  Bed Mobility Overal bed mobility: Modified Independent                Transfers Overall transfer level: Modified independent Equipment used: None             General transfer comment: x2 trials from bed and bedside commode. bed elevated to simulate home environment  Ambulation/Gait Ambulation/Gait assistance: Modified independent (Device/Increase time) Ambulation Distance (Feet): 280 Feet Assistive device: Rolling walker (2 wheeled) Gait Pattern/deviations: Step-through pattern Gait velocity: decreased Gait velocity interpretation: Below normal speed for age/gender General Gait Details: distance limited due to nurse request to return to room.   Stairs            Wheelchair Mobility     Modified Rankin (Stroke Patients Only)       Balance                                    Cognition Arousal/Alertness: Awake/alert Behavior During Therapy: WFL for tasks assessed/performed Overall Cognitive Status: Within Functional Limits for tasks assessed                      Exercises      General Comments        Pertinent Vitals/Pain Pain Assessment: 0-10 Pain Score: 9  Pain Location: right flank (chest tube incision) Pain Descriptors / Indicators: Aching Pain Intervention(s): Limited activity within patient's tolerance;Monitored during session    Home Living                      Prior Function            PT Goals (current goals can now be found in the care plan section) Acute Rehab PT Goals Patient Stated Goal: to go home Progress towards PT goals: Goals met/education completed, patient discharged from PT    Frequency       PT Plan Current plan remains appropriate    Co-evaluation             End of Session Equipment Utilized During Treatment:  (pt refused gait belt) Activity Tolerance: Patient tolerated treatment well Patient left: in bed;with family/visitor present;with call bell/phone within reach (pt sitting on EOB at end of session)  Time: 2608-8835 PT Time Calculation (min) (ACUTE ONLY): 22 min  Charges:  $Gait Training: 8-22 mins                    G Codes:      Jonathan [name] Aug 05, 2015, 12:11 PM   Jonathan [name], SPT [phone]

## 2015-07-14 ENCOUNTER — Inpatient Hospital Stay (HOSPITAL_COMMUNITY): Payer: Medicare Other

## 2015-07-14 LAB — GLUCOSE, CAPILLARY: Glucose-Capillary: 94 mg/dL (ref 65–99)

## 2015-07-14 MED ORDER — AMIODARONE HCL 200 MG PO TABS: 200.0000 mg | ORAL_TABLET | Freq: Every day | ORAL | Status: DC

## 2015-07-14 MED ORDER — AMIODARONE HCL 200 MG PO TABS
200.0000 mg | ORAL_TABLET | Freq: Every day | ORAL | Status: DC
  Administered 2015-07-14: 200 mg via ORAL
  Filled 2015-07-14: qty 1

## 2015-07-14 MED ORDER — ATENOLOL 25 MG PO TABS
25.0000 mg | ORAL_TABLET | Freq: Every day | ORAL | Status: DC
Start: 2015-07-14 — End: 2018-07-01

## 2015-07-14 MED ORDER — OXYCODONE HCL 5 MG PO TABS: 5.0000 mg | ORAL_TABLET | Freq: Four times a day (QID) | ORAL | Status: DC | PRN

## 2015-07-14 MED ORDER — ATENOLOL 25 MG PO TABS: 25.0000 mg | ORAL_TABLET | Freq: Every day | ORAL | Status: DC

## 2015-07-14 MED ORDER — GUAIFENESIN ER 600 MG PO TB12: 1200.0000 mg | ORAL_TABLET | Freq: Two times a day (BID) | ORAL | Status: DC | PRN

## 2015-07-14 NOTE — Progress Notes (Addendum)
      [address]       [place] [address]             [phone]       10 Days Post-Op Procedure(s) (LRB): VIDEO ASSISTED THORACOSCOPY (VATS)/ RIGHT UPPER LOBECTOMY (Right)  Subjective: Patient without specific complaints this am and he wants to go home.  Objective: Vital signs in last 24 hours: Temp:  [97.4 F (36.3 C)-98 F (36.7 C)] 98 F (36.7 C) (04/20 0415) Pulse Rate:  [56-66] 56 (04/20 0419) Cardiac Rhythm:  [-] Sinus bradycardia (04/19 2026) Resp:  [12-20] 18 (04/20 0419) BP: (88-108)/(51-67) 108/67 mmHg (04/20 0419) SpO2:  [92 %-100 %] 100 % (04/20 0419)     Intake/Output from previous day: 04/19 0701 - 04/20 0700 In: 480 [P.O.:480] Out: 550 [Urine:550]   Physical Exam:  Cardiovascular: RRR Pulmonary: Clear to auscultation left and slightly diminished right base. Abdomen: Soft, non tender, bowel sounds present. Wound:  Clean and dry.     Lab Results: CBC: No results for input(s): WBC, HGB, HCT, PLT in the last 72 hours. BMET:   Recent Labs  07/12/15 0506  NA 136  K 3.5  CL 98*  CO2 26  GLUCOSE 110*  BUN 17  CREATININE 1.27*  CALCIUM 8.7*    PT/INR: No results for input(s): LABPROT, INR in the last 72 hours. ABG:  INR: Will add last result for INR, ABG once components are confirmed Will add last 4 CBG results once components are confirmed  Assessment/Plan:  1. CV - Previous a fib. Maintaining SR and at times SB in the 50's. On Atenolol 25 mg at hs and Amiodarone 400 mg bid. Will decrease Amiodarone to 200 mg daily. 2.  Pulmonary - On room air. CXR appears to show left lung is clear,  right pneumothorax, right basilar atelectasis. Continue Spiriva. Mucinex for cough.Encourage incentive spirometer.  5. DM-CBGs 94/108/89. On Insulin. Will restart Metformin at discharge 6. Will discuss discharged disposition with Dr. Charlean Sanfilippo to discharge   Zhara Gieske MPA-C 07/14/2015,7:32 AM

## 2015-07-14 NOTE — Progress Notes (Signed)
DC instructions given to patient and family at bedside. Rx and restrictions given and reinforced. VSS. No PIV. All belongings sent home with patient. eICU and CCMT notified of DC. Pt escorted by NT via wheelchair to private vehicle driven by family.

## 2015-07-15 DIAGNOSIS — Z9181 History of falling: Secondary | ICD-10-CM | POA: Diagnosis not present

## 2015-07-15 DIAGNOSIS — C3411 Malignant neoplasm of upper lobe, right bronchus or lung: Secondary | ICD-10-CM | POA: Diagnosis not present

## 2015-07-15 DIAGNOSIS — I10 Essential (primary) hypertension: Secondary | ICD-10-CM | POA: Diagnosis not present

## 2015-07-15 DIAGNOSIS — Z483 Aftercare following surgery for neoplasm: Secondary | ICD-10-CM | POA: Diagnosis not present

## 2015-07-15 DIAGNOSIS — E119 Type 2 diabetes mellitus without complications: Secondary | ICD-10-CM | POA: Diagnosis not present

## 2015-07-15 DIAGNOSIS — R2689 Other abnormalities of gait and mobility: Secondary | ICD-10-CM | POA: Diagnosis not present

## 2015-07-19 DIAGNOSIS — E119 Type 2 diabetes mellitus without complications: Secondary | ICD-10-CM | POA: Diagnosis not present

## 2015-07-19 DIAGNOSIS — I10 Essential (primary) hypertension: Secondary | ICD-10-CM | POA: Diagnosis not present

## 2015-07-19 DIAGNOSIS — R2689 Other abnormalities of gait and mobility: Secondary | ICD-10-CM | POA: Diagnosis not present

## 2015-07-19 DIAGNOSIS — Z483 Aftercare following surgery for neoplasm: Secondary | ICD-10-CM | POA: Diagnosis not present

## 2015-07-19 DIAGNOSIS — C3411 Malignant neoplasm of upper lobe, right bronchus or lung: Secondary | ICD-10-CM | POA: Diagnosis not present

## 2015-07-19 DIAGNOSIS — Z9181 History of falling: Secondary | ICD-10-CM | POA: Diagnosis not present

## 2015-07-19 LAB — FUNGUS CULTURE WITH STAIN

## 2015-07-19 LAB — FUNGAL ORGANISM REFLEX

## 2015-07-19 LAB — FUNGUS CULTURE RESULT

## 2015-07-21 DIAGNOSIS — C3411 Malignant neoplasm of upper lobe, right bronchus or lung: Secondary | ICD-10-CM | POA: Diagnosis not present

## 2015-07-21 DIAGNOSIS — E119 Type 2 diabetes mellitus without complications: Secondary | ICD-10-CM | POA: Diagnosis not present

## 2015-07-21 DIAGNOSIS — Z9181 History of falling: Secondary | ICD-10-CM | POA: Diagnosis not present

## 2015-07-21 DIAGNOSIS — Z483 Aftercare following surgery for neoplasm: Secondary | ICD-10-CM | POA: Diagnosis not present

## 2015-07-21 DIAGNOSIS — I10 Essential (primary) hypertension: Secondary | ICD-10-CM | POA: Diagnosis not present

## 2015-07-21 DIAGNOSIS — R2689 Other abnormalities of gait and mobility: Secondary | ICD-10-CM | POA: Diagnosis not present

## 2015-07-22 LAB — FUNGUS CULTURE RESULT

## 2015-07-22 LAB — FUNGUS CULTURE WITH STAIN

## 2015-07-22 LAB — FUNGAL ORGANISM REFLEX

## 2015-07-25 ENCOUNTER — Other Ambulatory Visit: Payer: Self-pay | Admitting: Thoracic Surgery (Cardiothoracic Vascular Surgery)

## 2015-07-25 DIAGNOSIS — C3490 Malignant neoplasm of unspecified part of unspecified bronchus or lung: Secondary | ICD-10-CM

## 2015-07-26 ENCOUNTER — Encounter: Payer: Self-pay | Admitting: Thoracic Surgery (Cardiothoracic Vascular Surgery)

## 2015-07-26 ENCOUNTER — Telehealth: Payer: Self-pay | Admitting: Gastroenterology

## 2015-07-26 DIAGNOSIS — R2689 Other abnormalities of gait and mobility: Secondary | ICD-10-CM | POA: Diagnosis not present

## 2015-07-26 DIAGNOSIS — E119 Type 2 diabetes mellitus without complications: Secondary | ICD-10-CM | POA: Diagnosis not present

## 2015-07-26 DIAGNOSIS — Z483 Aftercare following surgery for neoplasm: Secondary | ICD-10-CM | POA: Diagnosis not present

## 2015-07-26 DIAGNOSIS — Z9181 History of falling: Secondary | ICD-10-CM | POA: Diagnosis not present

## 2015-07-26 DIAGNOSIS — C3411 Malignant neoplasm of upper lobe, right bronchus or lung: Secondary | ICD-10-CM | POA: Diagnosis not present

## 2015-07-26 DIAGNOSIS — I10 Essential (primary) hypertension: Secondary | ICD-10-CM | POA: Diagnosis not present

## 2015-07-26 NOTE — Progress Notes (Signed)
This encounter was created in error - please disregard.

## 2015-07-27 ENCOUNTER — Emergency Department (HOSPITAL_COMMUNITY)
Admission: EM | Admit: 2015-07-27 | Discharge: 2015-07-27 | Disposition: A | Discharge status: Home / Self Care | Payer: Medicare Other | Attending: Emergency Medicine | Admitting: Emergency Medicine

## 2015-07-27 ENCOUNTER — Encounter (HOSPITAL_COMMUNITY): Payer: Self-pay | Admitting: Emergency Medicine

## 2015-07-27 ENCOUNTER — Emergency Department (HOSPITAL_COMMUNITY): Payer: Medicare Other

## 2015-07-27 DIAGNOSIS — E669 Obesity, unspecified: Secondary | ICD-10-CM | POA: Diagnosis not present

## 2015-07-27 DIAGNOSIS — F329 Major depressive disorder, single episode, unspecified: Secondary | ICD-10-CM | POA: Diagnosis not present

## 2015-07-27 DIAGNOSIS — Z7984 Long term (current) use of oral hypoglycemic drugs: Secondary | ICD-10-CM | POA: Insufficient documentation

## 2015-07-27 DIAGNOSIS — Z79899 Other long term (current) drug therapy: Secondary | ICD-10-CM | POA: Diagnosis not present

## 2015-07-27 DIAGNOSIS — Z87891 Personal history of nicotine dependence: Secondary | ICD-10-CM | POA: Insufficient documentation

## 2015-07-27 DIAGNOSIS — Z859 Personal history of malignant neoplasm, unspecified: Secondary | ICD-10-CM | POA: Diagnosis not present

## 2015-07-27 DIAGNOSIS — N400 Enlarged prostate without lower urinary tract symptoms: Secondary | ICD-10-CM | POA: Diagnosis not present

## 2015-07-27 DIAGNOSIS — F419 Anxiety disorder, unspecified: Secondary | ICD-10-CM | POA: Diagnosis not present

## 2015-07-27 DIAGNOSIS — Z87442 Personal history of urinary calculi: Secondary | ICD-10-CM | POA: Diagnosis not present

## 2015-07-27 DIAGNOSIS — M1990 Unspecified osteoarthritis, unspecified site: Secondary | ICD-10-CM | POA: Diagnosis not present

## 2015-07-27 DIAGNOSIS — N189 Chronic kidney disease, unspecified: Secondary | ICD-10-CM | POA: Diagnosis not present

## 2015-07-27 DIAGNOSIS — R109 Unspecified abdominal pain: Secondary | ICD-10-CM

## 2015-07-27 DIAGNOSIS — I129 Hypertensive chronic kidney disease with stage 1 through stage 4 chronic kidney disease, or unspecified chronic kidney disease: Secondary | ICD-10-CM | POA: Diagnosis not present

## 2015-07-27 DIAGNOSIS — E119 Type 2 diabetes mellitus without complications: Secondary | ICD-10-CM | POA: Diagnosis not present

## 2015-07-27 DIAGNOSIS — N390 Urinary tract infection, site not specified: Secondary | ICD-10-CM

## 2015-07-27 DIAGNOSIS — K449 Diaphragmatic hernia without obstruction or gangrene: Secondary | ICD-10-CM | POA: Diagnosis not present

## 2015-07-27 DIAGNOSIS — Z86010 Personal history of colonic polyps: Secondary | ICD-10-CM | POA: Insufficient documentation

## 2015-07-27 DIAGNOSIS — Z8701 Personal history of pneumonia (recurrent): Secondary | ICD-10-CM | POA: Insufficient documentation

## 2015-07-27 DIAGNOSIS — R1032 Left lower quadrant pain: Secondary | ICD-10-CM | POA: Diagnosis not present

## 2015-07-27 DIAGNOSIS — J439 Emphysema, unspecified: Secondary | ICD-10-CM | POA: Insufficient documentation

## 2015-07-27 DIAGNOSIS — K219 Gastro-esophageal reflux disease without esophagitis: Secondary | ICD-10-CM | POA: Diagnosis not present

## 2015-07-27 DIAGNOSIS — R1010 Upper abdominal pain, unspecified: Secondary | ICD-10-CM | POA: Diagnosis present

## 2015-07-27 LAB — URINE MICROSCOPIC-ADD ON: RBC / HPF: NONE SEEN RBC/hpf (ref 0–5)

## 2015-07-27 LAB — URINALYSIS, ROUTINE W REFLEX MICROSCOPIC
Bilirubin Urine: NEGATIVE
Glucose, UA: NEGATIVE mg/dL
Hgb urine dipstick: NEGATIVE
Ketones, ur: NEGATIVE mg/dL
Nitrite: NEGATIVE
Protein, ur: NEGATIVE mg/dL
Specific Gravity, Urine: 1.019 (ref 1.005–1.030)
pH: 7 (ref 5.0–8.0)

## 2015-07-27 LAB — COMPREHENSIVE METABOLIC PANEL
ALT: 18 U/L (ref 17–63)
AST: 19 U/L (ref 15–41)
Albumin: 3.2 g/dL — ABNORMAL LOW (ref 3.5–5.0)
Alkaline Phosphatase: 61 U/L (ref 38–126)
Anion gap: 11 (ref 5–15)
BUN: 14 mg/dL (ref 6–20)
CO2: 22 mmol/L (ref 22–32)
Calcium: 9 mg/dL (ref 8.9–10.3)
Chloride: 105 mmol/L (ref 101–111)
Creatinine, Ser: 1.36 mg/dL — ABNORMAL HIGH (ref 0.61–1.24)
GFR calc Af Amer: 58 mL/min — ABNORMAL LOW (ref >60)
GFR calc non Af Amer: 50 mL/min — ABNORMAL LOW (ref >60)
Glucose, Bld: 90 mg/dL (ref 65–99)
Potassium: 3.9 mmol/L (ref 3.5–5.1)
Sodium: 138 mmol/L (ref 135–145)
Total Bilirubin: 0.4 mg/dL (ref 0.3–1.2)
Total Protein: 6.7 g/dL (ref 6.5–8.1)

## 2015-07-27 LAB — CBC
HCT: 38.9 % — ABNORMAL LOW (ref 39.0–52.0)
Hemoglobin: 13 g/dL (ref 13.0–17.0)
MCH: 30.8 pg (ref 26.0–34.0)
MCHC: 33.4 g/dL (ref 30.0–36.0)
MCV: 92.2 fL (ref 78.0–100.0)
Platelets: 298 10*3/uL (ref 150–400)
RBC: 4.22 MIL/uL (ref 4.22–5.81)
RDW: 14.2 % (ref 11.5–15.5)
WBC: 8 10*3/uL (ref 4.0–10.5)

## 2015-07-27 LAB — CBG MONITORING, ED
Glucose-Capillary: 79 mg/dL (ref 65–99)
Glucose-Capillary: 98 mg/dL (ref 65–99)

## 2015-07-27 LAB — I-STAT TROPONIN, ED: Troponin i, poc: 0 ng/mL (ref 0.00–0.08)

## 2015-07-27 LAB — LIPASE, BLOOD: Lipase: 41 U/L (ref 11–51)

## 2015-07-27 MED ORDER — IOPAMIDOL (ISOVUE-300) INJECTION 61%
INTRAVENOUS | Status: AC
  Administered 2015-07-27: 100 mL
  Filled 2015-07-27: qty 100

## 2015-07-27 MED ORDER — CEFTRIAXONE SODIUM 1 G IJ SOLR
1.0000 g | Freq: Once | INTRAMUSCULAR | Status: AC
  Administered 2015-07-27: 1 g via INTRAVENOUS
  Filled 2015-07-27: qty 10

## 2015-07-27 MED ORDER — CEPHALEXIN 500 MG PO CAPS: 500.0000 mg | ORAL_CAPSULE | Freq: Three times a day (TID) | ORAL | Status: DC

## 2015-07-27 NOTE — Telephone Encounter (Signed)
Left message on machine to call back  

## 2015-07-27 NOTE — ED Provider Notes (Signed)
CSN: [id number]     Arrival date & time 07/27/15  1158 History   First MD Initiated Contact with Patient 07/27/15 1417     Chief Complaint  Patient presents with  . Abdominal Pain     (Consider location/radiation/quality/duration/timing/severity/associated sxs/prior Treatment) HPI   Patient presents with ongoing episodes of upper abdominal pain with associated nausea and lightheadedness.  States the pain "eats on me" after bowel movement and hurts until he is able to eat again.  Has had constipation and malodorous bowel gas.  He has had these symptoms previously for months to years and has been to multiple medical facilities for same, diagnosed with gastroparesis.  He also has dysuria, which began after his recent hospitalization for right upper lobectomy (07/04/15-07/14/15).  From a pulmonary standpoint he states he is doing very well, using the incentive spirometer, denies cough, CP, or SOB.  No fevers.  Has complex urinary hx of large urinary diverticula, surgically removed in 2012.  Has been told he would have frequent urinary problems due to surgical history.    Past Medical History  Diagnosis Date  . Nicotine addiction   . Obesity   . Dyslipidemia   . Hypertension   . Elevated WBCs September 2011    and UTI Hospitalized   . Erectile dysfunction   . Polyneuropathy in other diseases classified elsewhere ([place]) 12/05/2012  . Lesion of ulnar nerve 12/05/2012    Right side  . Gait disorder   . COPD (chronic obstructive pulmonary disease) ([place])     normally have breathing tx prior to surgery  . Pneumonia     hx  . History of kidney stones   . GERD (gastroesophageal reflux disease)   . Neuropathy ([place])   . Vertigo   . Wears dentures     bottom  . Fatty liver   . Vitamin D deficiency   . Lumbar spinal stenosis   . Gastritis   . Hiatal hernia   . History of colon polyps   . Shortness of breath dyspnea   . Anxiety   . Depression   . Emphysema of lung ([place])   . Diabetes mellitus  without complication ([place])     borderline no med, controlled with diet  . Osteoporosis   . Abdominal aortic aneurysm ([place])     pt denies-  . History of kidney stones   . CKD (chronic kidney disease)     pt denies  . Cancer ([place])   . Arthritis     ankle, hands , herniated disc L3-4  . Osteoarthritis    Past Surgical History  Procedure Laterality Date  . Bladder surgery  02/2011    For Diverticulum of bladder removed  . Colon polyp resection    . Cysto    . Cholecystectomy N/A 02/24/2014    Procedure: LAPAROSCOPIC CHOLECYSTECTOMY;  Surgeon: Coralie Keens, MD;  Location: [facility];  Service: General;  Laterality: N/A;  . Colonoscopy    . Carpal tunnel release Right 07/09/2014    Procedure: RIGHT OPEN CARPAL TUNNEL RELEASE;  Surgeon: Mcarthur Rossetti, MD;  Location: [facility];  Service: Orthopedics;  Laterality: Right;  . Ankle arthroplasty Right 04/20/2015  . Total ankle arthroplasty Right 04/20/2015    Procedure: TOTAL ANKLE ARTHOPLASTY;  Surgeon: Newt Minion, MD;  Location: [facility];  Service: Orthopedics;  Laterality: Right;  . Video bronchoscopy with endobronchial navigation N/A 06/17/2015    Procedure: VIDEO BRONCHOSCOPY WITH ENDOBRONCHIAL NAVIGATION;  Surgeon: Melrose Nakayama, MD;  Location: [facility];  Service: Thoracic;  Laterality: N/A;  . Video bronchoscopy N/A 06/17/2015    Procedure: VIDEO BRONCHOSCOPY;  Surgeon: Melrose Nakayama, MD;  Location: [facility];  Service: Thoracic;  Laterality: N/A;  . Video bronchoscopy with endobronchial ultrasound N/A 06/17/2015    Procedure: VIDEO BRONCHOSCOPY WITH ENDOBRONCHIAL ULTRASOUND;  Surgeon: Melrose Nakayama, MD;  Location: [facility];  Service: Thoracic;  Laterality: N/A;  . Video assisted thoracoscopy (vats)/ lobectomy Right 07/04/2015    Procedure: VIDEO ASSISTED THORACOSCOPY (VATS)/ RIGHT UPPER LOBECTOMY;  Surgeon: Melrose Nakayama, MD;  Location: [facility];  Service: Thoracic;  Laterality: Right;   Family History   Problem Relation Age of Onset  . Cancer Father     Asbestos  . Colon cancer Neg Hx   . Colon polyps Neg Hx   . Kidney disease Neg Hx   . Esophageal cancer Neg Hx   . Gallbladder disease Neg Hx   . Heart disease Neg Hx   . Diabetes Neg Hx    Social History  Substance Use Topics  . Smoking status: Former Smoker -- 0.50 packs/day for 35 years    Types: Cigarettes    Quit date: 02/13/2015  . Smokeless tobacco: Never Used  . Alcohol Use: No    Review of Systems  All other systems reviewed and are negative.     Allergies  Lisinopril; Ciprofloxacin; Levaquin; and Triamterene-hctz  Home Medications   Prior to Admission medications   Medication Sig Start Date End Date Taking? Authorizing Provider  albuterol (PROVENTIL HFA;VENTOLIN HFA) 108 (90 BASE) MCG/ACT inhaler Inhale 1 puff into the lungs every 6 (six) hours as needed for wheezing or shortness of breath.    Historical Provider, MD  amiodarone (PACERONE) 200 MG tablet Take 1 tablet (200 mg total) by mouth daily. 07/14/15   Donielle Liston Alba, PA-C  atenolol (TENORMIN) 25 MG tablet Take 1 tablet (25 mg total) by mouth at bedtime. 07/14/15   Donielle Liston Alba, PA-C  esomeprazole (NEXIUM) 40 MG capsule Take 40 mg by mouth daily.     Historical Provider, MD  furosemide (LASIX) 40 MG tablet Take 40 mg by mouth daily as needed for fluid.    Historical Provider, MD  gabapentin (NEURONTIN) 400 MG capsule Take 400 mg by mouth 2 (two) times daily.     Historical Provider, MD  guaiFENesin (MUCINEX) 600 MG 12 hr tablet Take 2 tablets (1,200 mg total) by mouth 2 (two) times daily as needed for cough or to loosen phlegm. 07/14/15   Donielle Liston Alba, PA-C  ipratropium-albuterol (DUONEB) 0.5-2.5 (3) MG/3ML SOLN Take 3 mLs by nebulization 3 (three) times daily as needed (wheezing).     Historical Provider, MD  meclizine (ANTIVERT) 50 MG tablet Take 50 mg by mouth 2 (two) times daily as needed for dizziness.     Historical Provider, MD   metFORMIN (GLUCOPHAGE) 500 MG tablet Take 250 mg by mouth daily with breakfast.     Historical Provider, MD  metoCLOPramide (REGLAN) 5 MG tablet Take 5 mg by mouth 2 (two) times daily.     Historical Provider, MD  Misc Natural Products (COLON CLEANSE PO) Take 1 capsule by mouth daily.    Historical Provider, MD  nicotine polacrilex (NICORETTE) 4 MG gum Take 4 mg by mouth as needed for smoking cessation.    Historical Provider, MD  nitroGLYCERIN (NITROSTAT) 0.4 MG SL tablet Place 0.4 mg under the tongue every 5 (five) minutes as needed for chest pain.  Historical Provider, MD  oxyCODONE (OXY IR/ROXICODONE) 5 MG immediate release tablet Take 1-2 tablets (5-10 mg total) by mouth every 6 (six) hours as needed for severe pain. 07/14/15   Donielle Liston Alba, PA-C  potassium chloride (K-DUR) 10 MEQ tablet Take 10 mEq by mouth daily.    Historical Provider, MD  predniSONE (DELTASONE) 10 MG tablet Take 20 mg by mouth daily as needed. For gout per patient    Historical Provider, MD  Probiotic Product (PROBIOTIC DAILY PO) Take 1 capsule by mouth daily as needed.    Historical Provider, MD  sennosides-docusate sodium (SENOKOT-S) 8.6-50 MG tablet Take 1 tablet by mouth every other day.    Historical Provider, MD  sildenafil (VIAGRA) 100 MG tablet Take 100 mg by mouth daily as needed for erectile dysfunction. One tablet 30 minutes before intercourse, maximum twice weekly    Historical Provider, MD  tamsulosin (FLOMAX) 0.4 MG CAPS capsule Take 0.4 mg by mouth daily as needed. For bladder per patient    Historical Provider, MD  tiotropium (SPIRIVA) 18 MCG inhalation capsule Place 18 mcg into inhaler and inhale daily as needed (Shortness of breath).     Historical Provider, MD   BP 111/77 mmHg  Pulse 101  Temp(Src) 97.5 F (36.4 C) (Oral)  Resp 18  SpO2 95% Physical Exam  Constitutional: He appears well-developed and well-nourished. No distress.  HENT:  Head: Normocephalic and atraumatic.  Neck: Neck  supple.  Cardiovascular: Normal rate and regular rhythm.   Pulmonary/Chest: Effort normal and breath sounds normal. No respiratory distress. He has no wheezes. He has no rales.  Abdominal: Soft. He exhibits no distension and no mass. There is tenderness in the suprapubic area and left lower quadrant. There is no rebound and no guarding.  Neurological: He is alert. He exhibits normal muscle tone.  Skin: He is not diaphoretic.  Nursing note and vitals reviewed.   ED Course  Procedures (including critical care time) Labs Review Labs Reviewed  COMPREHENSIVE METABOLIC PANEL - Abnormal; Notable for the following:    Creatinine, Ser 1.36 (*)    Albumin 3.2 (*)    GFR calc non Af Amer 50 (*)    GFR calc Af Amer 58 (*)    All other components within normal limits  CBC - Abnormal; Notable for the following:    HCT 38.9 (*)    All other components within normal limits  URINALYSIS, ROUTINE W REFLEX MICROSCOPIC (NOT AT [facility]) - Abnormal; Notable for the following:    APPearance CLOUDY (*)    Leukocytes, UA MODERATE (*)    All other components within normal limits  URINE MICROSCOPIC-ADD ON - Abnormal; Notable for the following:    Squamous Epithelial / LPF 0-5 (*)    Bacteria, UA FEW (*)    All other components within normal limits  URINE CULTURE  LIPASE, BLOOD  I-STAT TROPOININ, ED  CBG MONITORING, ED  CBG MONITORING, ED    Imaging Review Ct Abdomen Pelvis W Contrast  07/27/2015  CLINICAL DATA:  Epigastric abdominal pain. Constipation. History kidney stones. EXAM: CT ABDOMEN AND PELVIS WITH CONTRAST TECHNIQUE: Multidetector CT imaging of the abdomen and pelvis was performed using the standard protocol following bolus administration of intravenous contrast. CONTRAST:  171m ISOVUE-300 IOPAMIDOL (ISOVUE-300) INJECTION 61% COMPARISON:  PET-CT- 05/16/2015; CT abdomen pelvis- 05/04/2015; 02/03/2014 FINDINGS: Lower chest: Limited visualization of the lower thorax demonstrates postsurgical change of  the right lung with small right-sided pleural effusion and very tiny foci of extrapleural air  are/ pneumothorax (representative images 4 and 6, series 7). Minimal subsegmental atelectasis within the left lower lobe and left costophrenic angle. Normal heart size.  No pericardial effusion. Hepatobiliary: Normal hepatic contour. No discrete hepatic lesions. Post cholecystectomy. No intra extrahepatic bili duct dilatation. No ascites. Pancreas: Normal in appearance Spleen: Normal in appearance Adrenals/Urinary Tract: There is symmetric enhancement and excretion of the bilateral kidneys. Renal cysts are again seen bilaterally with dominant partially exophytic cyst arising from the interpolar aspect of the right kidney measuring approximately 2.2 cm in diameter (image 22, series 4) and dominant left-sided renal cyst measuring approximately 2.5 cm (image 27, series 4). Additional sub cm hypo attenuating renal lesions are too small to adequately characterize though appear morphologically unchanged in favored to represent additional renal cysts. There is a punctate (approximately 3 mm) nonobstructing stone within the inferior pole the left kidney (coronal image 83, series 5). No right-sided renal stones. No renal stones are seen along the expected course of either ureter or the urinary bladder. There is diffuse thickening of the urinary bladder wall, potentially accentuated due to underdistention. Normal appearance of the bilateral adrenal glands. Stomach/Bowel: Small hiatal hernia. Moderate colonic stool burden without evidence of enteric obstruction. Normal appearance of the terminal ileum and appendix. No pneumoperitoneum, pneumatosis or portal venous gas. Vascular/Lymphatic: Moderate to large amount of irregular mixed calcified and noncalcified atherosclerotic plaque throughout the abdominal aorta. There is mild fusiform aneurysmal dilatation of the abdominal aorta measuring approximately 3 cm in diameter (image 45,  series 2) similar to the 08/22/2013 examination. No bulky retroperitoneal, mesenteric, pelvic or inguinal lymphadenopathy. Reproductive: Dystrophic calcifications within normal sized prostate gland. No free fluid in the pelvic cul-de-sac. Other: Tiny mesenteric fat containing periumbilical hernia. Minimal amount of subcutaneous edema about the midline of the low back. Musculoskeletal: No acute or aggressive osseous abnormalities. Moderate DDD of L4-L5 with disc space height loss, endplate irregularity and sclerosis. Stigmata of DISH with the caudal aspect of the thoracic spine. IMPRESSION: 1. Small hiatal hernia. Otherwise, no explanation for patient's epigastric abdominal pain. Specifically, no evidence of enteric or urinary obstruction. 2. Moderate colonic stool burden without evidence of enteric obstruction. 3. Unchanged mild fusiform aneurysmal dilatation of the abdominal aorta measuring 3 cm in diameter, stable since the 07/2013 examination. 4. Moderate to large amount of irregular mixed calcified and noncalcified atherosclerotic plaque throughout the abdominal aorta. 5. Unchanged punctate (approximately 3 mm) nonobstructing left-sided renal stone. No evidence of right-sided nephrolithiasis. No evidence urinary obstruction. 6. Diffuse thickening the urinary bladder wall, potentially accentuated due to underdistention though cystitis could have a similar appearance. Correlation urinalysis is recommended. 7. Postsurgical change of the right hemi thorax with small right-sided effusion and tiny foci of extrapleural air/pneumothorax. Electronically Signed   By: Sandi Mariscal M.D.   On: 07/27/2015 18:02   I have personally reviewed and evaluated these images and lab results as part of my medical decision-making.   EKG Interpretation   Date/Time:  Wednesday Jul 27 2015 12:26:06 EDT Ventricular Rate:  93 PR Interval:  148 QRS Duration: 78 QT Interval:  352 QTC Calculation: 437 R Axis:   68 Text  Interpretation:  Normal sinus rhythm Normal ECG No acute ischemic  changes. No significant change from prior EKG  Confirmed by LIU MD, DANA  [phone]) on 07/27/2015 5:21:54 PM      MDM   Final diagnoses:  Abdominal pain, unspecified abdominal location  UTI (lower urinary tract infection)    Afebrile, nontoxic patient with chronic intermittent  abdominal pain and constipation, recent surgery from which he is recovering well but has developed dysuria.  UA appears infected, culture pending.  CT abd/pelvis without abnormal acute findings.  Labs unremarkable.  Given IV rocephin in ED.  Pt eating sandwich and drinking soda, feeling well prior to discharge. Discussed pt and workup, plan with Dr Oleta Mouse who also saw the patient.    D/C home with GI, urology referrals, close PCP follow up, keflex.  Pt has enema at home he can use, prefers to do it there.  Discussed results, findings, treatment, and follow up  with patient.  Pt given return precautions.  Pt verbalizes understanding and agrees with plan.        Clayton Bibles, PA-C 07/27/15 1938  Forde Dandy, MD 07/27/15 [phone]

## 2015-07-27 NOTE — ED Provider Notes (Signed)
Medical screening examination/treatment/procedure(s) were conducted as a shared visit with non-physician practitioner(s) and myself.  I personally evaluated the patient during the encounter.   EKG Interpretation   Date/Time:  Wednesday Jul 27 2015 12:26:06 EDT Ventricular Rate:  93 PR Interval:  148 QRS Duration: 78 QT Interval:  352 QTC Calculation: 437 R Axis:   68 Text Interpretation:  Normal sinus rhythm Normal ECG No acute ischemic  changes. No significant change from prior EKG  Confirmed by Garison Genova MD, Abdias Hickam  [phone]) on 07/27/2015 5:21:8 PM      75 year old male who presents with intermittent abdominal pain and bloating. He has a history of hypertension, hyperlipidemia, diabetes,CKD, cholecystectomy, and recurrent UTIs in the setting of bladder diverticulum status post resection. States ongoing symptoms of early satiety and intermittent left lower quadrant pain for several months. Recently had a right upper lobectomy in mid April for lung mass, and has been recovering well from this. Has not had any weight loss, fevers or chills, vomiting, diarrhea. States that he normally is very constipated with bowel movement every 3 days that only move after taking laxatives. States that after each meal he has intermittent left lower quadrant abdominal pain, abdominal bloating, and nausea. States that sometimes this gets better after taking probiotics or Ginger. He has stable vital signs on arrival. Has a benign abdomen. UA suggestive of a UTI, but on chart review he always appears to chronically have a UTI. Will treat for UTI, but will perform CT abd/pelvis to rule out obstruction vs mass vs diverticulitis or other serious intraabdminal processes. If negative CT, can have continued outpatient management with GI physician and PCP.   Forde Dandy, MD 07/27/15 1726

## 2015-07-27 NOTE — ED Notes (Signed)
Pt sts abd bloating and feeling funny after he has BM and has to eat again; pt sts "he has to eat heavy foods"; pt sts hx of lung CA

## 2015-07-27 NOTE — ED Notes (Signed)
Pt brought back from Ct scan. Pt placed on monitor. Pt remains monitored by blood pressure, pulse ox, and 5 lead.

## 2015-07-27 NOTE — ED Notes (Signed)
Pt sts "he is going to fall out if he cant lay down" pt instructed to stay in wheel chair and not get up on his own

## 2015-07-27 NOTE — Discharge Instructions (Signed)
Read the information below.  Use the prescribed medication as directed.  Please discuss all new medications with your pharmacist.  You may return to the Emergency Department at any time for worsening condition or any new symptoms that concern you.   If you develop high fevers, worsening abdominal pain, uncontrolled vomiting, or are unable to tolerate fluids by mouth, return to the ER for a recheck.     Urinary Tract Infection Urinary tract infections (UTIs) can develop anywhere along your urinary tract. Your urinary tract is your body's drainage system for removing wastes and extra water. Your urinary tract includes two kidneys, two ureters, a bladder, and a urethra. Your kidneys are a pair of bean-shaped organs. Each kidney is about the size of your fist. They are located below your ribs, one on each side of your spine. CAUSES Infections are caused by microbes, which are microscopic organisms, including fungi, viruses, and bacteria. These organisms are so small that they can only be seen through a microscope. Bacteria are the microbes that most commonly cause UTIs. SYMPTOMS  Symptoms of UTIs may vary by age and gender of the patient and by the location of the infection. Symptoms in young women typically include a frequent and intense urge to urinate and a painful, burning feeling in the bladder or urethra during urination. Older women and men are more likely to be tired, shaky, and weak and have muscle aches and abdominal pain. A fever may mean the infection is in your kidneys. Other symptoms of a kidney infection include pain in your back or sides below the ribs, nausea, and vomiting. DIAGNOSIS To diagnose a UTI, your caregiver will ask you about your symptoms. Your caregiver will also ask you to provide a urine sample. The urine sample will be tested for bacteria and white blood cells. White blood cells are made by your body to help fight infection. TREATMENT  Typically, UTIs can be treated with  medication. Because most UTIs are caused by a bacterial infection, they usually can be treated with the use of antibiotics. The choice of antibiotic and length of treatment depend on your symptoms and the type of bacteria causing your infection. HOME CARE INSTRUCTIONS  If you were prescribed antibiotics, take them exactly as your caregiver instructs you. Finish the medication even if you feel better after you have only taken some of the medication.  Drink enough water and fluids to keep your urine clear or pale yellow.  Avoid caffeine, tea, and carbonated beverages. They tend to irritate your bladder.  Empty your bladder often. Avoid holding urine for long periods of time.  Empty your bladder before and after sexual intercourse.  After a bowel movement, women should cleanse from front to back. Use each tissue only once. SEEK MEDICAL CARE IF:   You have back pain.  You develop a fever.  Your symptoms do not begin to resolve within 3 days. SEEK IMMEDIATE MEDICAL CARE IF:   You have severe back pain or lower abdominal pain.  You develop chills.  You have nausea or vomiting.  You have continued burning or discomfort with urination. MAKE SURE YOU:   Understand these instructions.  Will watch your condition.  Will get help right away if you are not doing well or get worse.   This information is not intended to replace advice given to you by your health care provider. Make sure you discuss any questions you have with your health care provider.   Document Released: 12/20/2004 Document Revised:  12/01/2014 Document Reviewed: 04/20/2011 Elsevier Interactive Patient Education 2016 Elsevier [employer]  Abdominal Pain, Adult Many things can cause abdominal pain. Usually, abdominal pain is not caused by a disease and will improve without treatment. It can often be observed and treated at home. Your health care provider will do a physical exam and possibly order blood tests and X-rays to help  determine the seriousness of your pain. However, in many cases, more time must pass before a clear cause of the pain can be found. Before that point, your health care provider may not know if you need more testing or further treatment. HOME CARE INSTRUCTIONS Monitor your abdominal pain for any changes. The following actions may help to alleviate any discomfort you are experiencing:  Only take over-the-counter or prescription medicines as directed by your health care provider.  Do not take laxatives unless directed to do so by your health care provider.  Try a clear liquid diet (broth, tea, or water) as directed by your health care provider. Slowly move to a bland diet as tolerated. SEEK MEDICAL CARE IF:  You have unexplained abdominal pain.  You have abdominal pain associated with nausea or diarrhea.  You have pain when you urinate or have a bowel movement.  You experience abdominal pain that wakes you in the night.  You have abdominal pain that is worsened or improved by eating food.  You have abdominal pain that is worsened with eating fatty foods.  You have a fever. SEEK IMMEDIATE MEDICAL CARE IF:  Your pain does not go away within 2 hours.  You keep throwing up (vomiting).  Your pain is felt only in portions of the abdomen, such as the right side or the left lower portion of the abdomen.  You pass bloody or black tarry stools. MAKE SURE YOU:  Understand these instructions.  Will watch your condition.  Will get help right away if you are not doing well or get worse.   This information is not intended to replace advice given to you by your health care provider. Make sure you discuss any questions you have with your health care provider.   Document Released: 12/20/2004 Document Revised: 12/01/2014 Document Reviewed: 11/19/2012 Elsevier Interactive Patient Education [employer].

## 2015-07-28 NOTE — Telephone Encounter (Signed)
The pt was called and notified of the appt, he states he wants to establish with Dr Loletha Carrow and discuss gastroparesis and colon.

## 2015-07-28 NOTE — Telephone Encounter (Signed)
The pt has been scheduled for 09/07/15 with Dr Loletha Carrow.

## 2015-07-29 DIAGNOSIS — R2689 Other abnormalities of gait and mobility: Secondary | ICD-10-CM | POA: Diagnosis not present

## 2015-07-29 DIAGNOSIS — Z483 Aftercare following surgery for neoplasm: Secondary | ICD-10-CM | POA: Diagnosis not present

## 2015-07-29 DIAGNOSIS — Z9181 History of falling: Secondary | ICD-10-CM | POA: Diagnosis not present

## 2015-07-29 DIAGNOSIS — E119 Type 2 diabetes mellitus without complications: Secondary | ICD-10-CM | POA: Diagnosis not present

## 2015-07-29 DIAGNOSIS — I10 Essential (primary) hypertension: Secondary | ICD-10-CM | POA: Diagnosis not present

## 2015-07-29 DIAGNOSIS — C3411 Malignant neoplasm of upper lobe, right bronchus or lung: Secondary | ICD-10-CM | POA: Diagnosis not present

## 2015-07-29 LAB — URINE CULTURE

## 2015-08-01 ENCOUNTER — Other Ambulatory Visit: Payer: Self-pay | Admitting: *Deleted

## 2015-08-01 DIAGNOSIS — C3411 Malignant neoplasm of upper lobe, right bronchus or lung: Secondary | ICD-10-CM | POA: Diagnosis not present

## 2015-08-01 DIAGNOSIS — E119 Type 2 diabetes mellitus without complications: Secondary | ICD-10-CM | POA: Diagnosis not present

## 2015-08-01 DIAGNOSIS — Z483 Aftercare following surgery for neoplasm: Secondary | ICD-10-CM | POA: Diagnosis not present

## 2015-08-01 DIAGNOSIS — Z902 Acquired absence of lung [part of]: Secondary | ICD-10-CM

## 2015-08-01 DIAGNOSIS — Z9181 History of falling: Secondary | ICD-10-CM | POA: Diagnosis not present

## 2015-08-01 DIAGNOSIS — I10 Essential (primary) hypertension: Secondary | ICD-10-CM | POA: Diagnosis not present

## 2015-08-01 DIAGNOSIS — R2689 Other abnormalities of gait and mobility: Secondary | ICD-10-CM | POA: Diagnosis not present

## 2015-08-01 LAB — ACID FAST CULTURE WITH REFLEXED SENSITIVITIES (MYCOBACTERIA)
Acid Fast Culture: NEGATIVE
Acid Fast Culture: NEGATIVE
Acid Fast Culture: NEGATIVE

## 2015-08-02 ENCOUNTER — Encounter: Payer: Self-pay | Admitting: Thoracic Surgery (Cardiothoracic Vascular Surgery)

## 2015-08-02 ENCOUNTER — Ambulatory Visit (INDEPENDENT_AMBULATORY_CARE_PROVIDER_SITE_OTHER): Payer: Self-pay | Admitting: Thoracic Surgery (Cardiothoracic Vascular Surgery)

## 2015-08-02 ENCOUNTER — Ambulatory Visit
Admission: RE | Admit: 2015-08-02 | Discharge: 2015-08-02 | Disposition: A | Discharge status: Home / Self Care | Payer: Medicare Other | Source: Ambulatory Visit | Attending: Thoracic Surgery (Cardiothoracic Vascular Surgery) | Admitting: Thoracic Surgery (Cardiothoracic Vascular Surgery)

## 2015-08-02 VITALS — BP 120/79 | HR 87 | Resp 20 | Ht >= 80 in | Wt 275.0 lb

## 2015-08-02 DIAGNOSIS — C3411 Malignant neoplasm of upper lobe, right bronchus or lung: Secondary | ICD-10-CM

## 2015-08-02 DIAGNOSIS — Z902 Acquired absence of lung [part of]: Secondary | ICD-10-CM

## 2015-08-02 DIAGNOSIS — R0602 Shortness of breath: Secondary | ICD-10-CM | POA: Diagnosis not present

## 2015-08-02 MED ORDER — AMIODARONE HCL 200 MG PO TABS: 200.0000 mg | ORAL_TABLET | Freq: Every day | ORAL | Status: DC

## 2015-08-02 NOTE — Progress Notes (Signed)
[address]       [place] [address]             [phone]       HPI: [name] returns today for scheduled postoperative follow-up visit.  He is a 75 year old man with a history of tobacco abuse and Agent Orange exposure who presented with bilateral upper lobe lung nodules. Navigational bronchoscopy and biopsy showed both nodules were squamous cell carcinomas. I did a thoracoscopic right upper lobectomy and node dissection on him on 07/04/2015. His postoperative course was, complicated by a prolonged air leak and atrial fibrillation. He was eventually discharged home on day 10. He was in sinus rhythm, discharge.  Pathology showed a T1b, N0, stage IA squamous cell carcinoma.  Mr. Frankl is feeling better. He had quite a lot of pain initially. Now he is taking one fourth of an oxycodone tablet 4 times daily. His energy levels are starting to improve. His biggest complaint is abdominal discomfort. He was seen in the emergency room last week regarding that. A CT was unrevealing. He is scheduled to see a gastroenterologist in early June. Past Medical History  Diagnosis Date  . Nicotine addiction   . Obesity   . Dyslipidemia   . Hypertension   . Elevated WBCs September 2011    and UTI Hospitalized   . Erectile dysfunction   . Polyneuropathy in other diseases classified elsewhere ([place]) 12/05/2012  . Lesion of ulnar nerve 12/05/2012    Right side  . Gait disorder   . COPD (chronic obstructive pulmonary disease) ([place])     normally have breathing tx prior to surgery  . Pneumonia     hx  . History of kidney stones   . GERD (gastroesophageal reflux disease)   . Neuropathy ([place])   . Vertigo   . Wears dentures     bottom  . Fatty liver   . Vitamin D deficiency   . Lumbar spinal stenosis   . Gastritis   . Hiatal hernia   . History of colon polyps   . Shortness of breath dyspnea   . Anxiety   . Depression   . Emphysema of lung ([place])   . Diabetes mellitus  without complication ([place])     borderline no med, controlled with diet  . Osteoporosis   . Abdominal aortic aneurysm ([place])     pt denies-  . History of kidney stones   . CKD (chronic kidney disease)     pt denies  . Cancer ([place])   . Arthritis     ankle, hands , herniated disc L3-4  . Osteoarthritis       Current Outpatient Prescriptions  Medication Sig Dispense Refill  . albuterol (PROVENTIL HFA;VENTOLIN HFA) 108 (90 BASE) MCG/ACT inhaler Inhale 1 puff into the lungs every 6 (six) hours as needed for wheezing or shortness of breath.    [name] amiodarone (PACERONE) 200 MG tablet Take 1 tablet (200 mg total) by mouth daily. 30 tablet 0  . atenolol (TENORMIN) 25 MG tablet Take 1 tablet (25 mg total) by mouth at bedtime. (Patient taking differently: Take 25 mg by mouth every morning. ) 30 tablet 1  . cephALEXin (KEFLEX) 500 MG capsule Take 1 capsule (500 mg total) by mouth 3 (three) times daily. 21 capsule 0  . esomeprazole (NEXIUM) 40 MG capsule Take 40 mg by mouth daily.     [name] gabapentin (NEURONTIN) 400 MG capsule Take 400 mg by mouth 2 (  two) times daily as needed (for neuropathy).     [name] guaiFENesin (MUCINEX) 600 MG 12 hr tablet Take 2 tablets (1,200 mg total) by mouth 2 (two) times daily as needed for cough or to loosen phlegm.    [name] ipratropium-albuterol (DUONEB) 0.5-2.5 (3) MG/3ML SOLN Take 3 mLs by nebulization daily as needed (wheezing).     [name] KLOR-CON M10 10 MEQ tablet Take 1 tablet by mouth daily.    . metFORMIN (GLUCOPHAGE) 500 MG tablet Take 250 mg by mouth daily.     . metoCLOPramide (REGLAN) 5 MG tablet Take 5 mg by mouth 4 (four) times daily -  before meals and at bedtime.     . nicotine polacrilex (NICORETTE) 4 MG gum Take 4 mg by mouth daily.     [name] oxyCODONE (OXY IR/ROXICODONE) 5 MG immediate release tablet Take 1-2 tablets (5-10 mg total) by mouth every 6 (six) hours as needed for severe pain. 30 tablet 0  . Oxycodone HCl 10 MG TABS Take 2.5 mg by mouth 4 (four) times daily as  needed.    . predniSONE (DELTASONE) 10 MG tablet Take 10 mg by mouth 2 (two) times daily as needed (for gout symptoms). For gout per patient    . Probiotic Product (PROBIOTIC DAILY PO) Take 1 capsule by mouth daily as needed.    . sennosides-docusate sodium (SENOKOT-S) 8.6-50 MG tablet Take 1 tablet by mouth every other day.    . tamsulosin (FLOMAX) 0.4 MG CAPS capsule Take 0.4 mg by mouth daily as needed. For bladder per patient    . TENORETIC 50 50-25 MG tablet Take 1 tablet by mouth at bedtime.    [name] tiotropium (SPIRIVA) 18 MCG inhalation capsule Place 18 mcg into inhaler and inhale daily as needed (Shortness of breath).      No current facility-administered medications for this visit.   Facility-Administered Medications Ordered in Other Visits  Medication Dose Route Frequency Provider Last Rate Last Dose  . lactated ringers infusion    Continuous PRN Kerby Less, CRNA 0 mL/hr at 07/02/15 2100      Physical Exam BP 120/79 mmHg  Pulse 87  Resp 20  Ht '6\' 8"'$  (2.032 m)  Wt 275 lb (124.739 kg)  BMI 30.21 kg/m2  SpO57 52% 75 year old man in no acute distress Alert and oriented 3 with no focal deficits Lungs diminished right base, otherwise clear, no wheezing Cardiac regular rate and rhythm normal S1 and S2 Incision and chest tube sites healing well  Diagnostic Tests: CHEST 2 VIEW  COMPARISON: PA and lateral chest x-ray of July 14, 2015  FINDINGS: There is persistent volume loss on the right. There is a persistent right-sided pneumothorax. There is stable pleural thickening or loculated pleural effusion at the right lung base. The left lung is clear. There is no mediastinal shift. The heart and pulmonary vascularity are normal. There is mild stable anterior wedging of 2 mid thoracic vertebral bodies.  IMPRESSION: Persistent approximately 15-20% right-sided pneumothorax. Persistent loculated pleural effusion or pleural thickening at the right lung base. No evidence  of CHF. The left lung is clear.  These results will be called to the ordering clinician or representative by the Radiologist Assistant, and communication documented in the PACS or zVision Dashboard.   Electronically Signed  By: David Martinique M.D.  On: 08/02/2015 15:46  I personally reviewed the chest x-ray compared to his previous films. He has a small stable apical space after right upper lobectomy.  Impression: 75 year old man who  is status post right upper lobectomy and node dissection for a stage IA squamous cell carcinoma of the right upper lobe. He had a rough time initially with a prolonged air leak, but is making good progress at this point. He is taking only very small doses of narcotics.  He still has a small cavitary squamous cell carcinoma in the left upper lobe. He is not anxious to consider surgery for that at the present time. He thinks he might prefer to go with radiation. I do think that is a viable option should he choose to do that. I do want to give him a little more time before he makes a final decision, so I'll plan to see him back in one month to further discuss that.  I also want him to follow-up with Dr. Jacquiline Doe before he makes any final decisions regarding treatment of left upper lobe nodule.  Plan: Follow-up with Dr. Jacquiline Doe  Return in 4 weeks with PA and lateral chest x-ray  Melrose Nakayama, MD [facility] [phone]

## 2015-08-03 DIAGNOSIS — I10 Essential (primary) hypertension: Secondary | ICD-10-CM | POA: Diagnosis not present

## 2015-08-03 DIAGNOSIS — R2689 Other abnormalities of gait and mobility: Secondary | ICD-10-CM | POA: Diagnosis not present

## 2015-08-03 DIAGNOSIS — Z9181 History of falling: Secondary | ICD-10-CM | POA: Diagnosis not present

## 2015-08-03 DIAGNOSIS — Z483 Aftercare following surgery for neoplasm: Secondary | ICD-10-CM | POA: Diagnosis not present

## 2015-08-03 DIAGNOSIS — E119 Type 2 diabetes mellitus without complications: Secondary | ICD-10-CM | POA: Diagnosis not present

## 2015-08-03 DIAGNOSIS — C3411 Malignant neoplasm of upper lobe, right bronchus or lung: Secondary | ICD-10-CM | POA: Diagnosis not present

## 2015-08-10 DIAGNOSIS — C3411 Malignant neoplasm of upper lobe, right bronchus or lung: Secondary | ICD-10-CM | POA: Diagnosis not present

## 2015-08-10 DIAGNOSIS — Z9181 History of falling: Secondary | ICD-10-CM | POA: Diagnosis not present

## 2015-08-10 DIAGNOSIS — I10 Essential (primary) hypertension: Secondary | ICD-10-CM | POA: Diagnosis not present

## 2015-08-10 DIAGNOSIS — Z483 Aftercare following surgery for neoplasm: Secondary | ICD-10-CM | POA: Diagnosis not present

## 2015-08-10 DIAGNOSIS — E119 Type 2 diabetes mellitus without complications: Secondary | ICD-10-CM | POA: Diagnosis not present

## 2015-08-10 DIAGNOSIS — R2689 Other abnormalities of gait and mobility: Secondary | ICD-10-CM | POA: Diagnosis not present

## 2015-08-11 DIAGNOSIS — Z9181 History of falling: Secondary | ICD-10-CM | POA: Diagnosis not present

## 2015-08-11 DIAGNOSIS — Z483 Aftercare following surgery for neoplasm: Secondary | ICD-10-CM | POA: Diagnosis not present

## 2015-08-11 DIAGNOSIS — E119 Type 2 diabetes mellitus without complications: Secondary | ICD-10-CM | POA: Diagnosis not present

## 2015-08-11 DIAGNOSIS — C3411 Malignant neoplasm of upper lobe, right bronchus or lung: Secondary | ICD-10-CM | POA: Diagnosis not present

## 2015-08-11 DIAGNOSIS — I10 Essential (primary) hypertension: Secondary | ICD-10-CM | POA: Diagnosis not present

## 2015-08-11 DIAGNOSIS — R2689 Other abnormalities of gait and mobility: Secondary | ICD-10-CM | POA: Diagnosis not present

## 2015-08-12 DIAGNOSIS — C3492 Malignant neoplasm of unspecified part of left bronchus or lung: Secondary | ICD-10-CM | POA: Diagnosis not present

## 2015-08-12 DIAGNOSIS — R918 Other nonspecific abnormal finding of lung field: Secondary | ICD-10-CM | POA: Diagnosis not present

## 2015-08-16 DIAGNOSIS — Z483 Aftercare following surgery for neoplasm: Secondary | ICD-10-CM | POA: Diagnosis not present

## 2015-08-16 DIAGNOSIS — Z9181 History of falling: Secondary | ICD-10-CM | POA: Diagnosis not present

## 2015-08-16 DIAGNOSIS — R2689 Other abnormalities of gait and mobility: Secondary | ICD-10-CM | POA: Diagnosis not present

## 2015-08-16 DIAGNOSIS — E119 Type 2 diabetes mellitus without complications: Secondary | ICD-10-CM | POA: Diagnosis not present

## 2015-08-16 DIAGNOSIS — I10 Essential (primary) hypertension: Secondary | ICD-10-CM | POA: Diagnosis not present

## 2015-08-16 DIAGNOSIS — C3411 Malignant neoplasm of upper lobe, right bronchus or lung: Secondary | ICD-10-CM | POA: Diagnosis not present

## 2015-08-17 DIAGNOSIS — C3431 Malignant neoplasm of lower lobe, right bronchus or lung: Secondary | ICD-10-CM | POA: Diagnosis not present

## 2015-08-17 DIAGNOSIS — E559 Vitamin D deficiency, unspecified: Secondary | ICD-10-CM | POA: Diagnosis not present

## 2015-08-17 DIAGNOSIS — E7801 Familial hypercholesterolemia: Secondary | ICD-10-CM | POA: Diagnosis not present

## 2015-08-17 DIAGNOSIS — I10 Essential (primary) hypertension: Secondary | ICD-10-CM | POA: Diagnosis not present

## 2015-08-17 DIAGNOSIS — Z1211 Encounter for screening for malignant neoplasm of colon: Secondary | ICD-10-CM | POA: Diagnosis not present

## 2015-08-17 DIAGNOSIS — Z6830 Body mass index (BMI) 30.0-30.9, adult: Secondary | ICD-10-CM | POA: Diagnosis not present

## 2015-08-17 DIAGNOSIS — Z0000 Encounter for general adult medical examination without abnormal findings: Secondary | ICD-10-CM | POA: Diagnosis not present

## 2015-08-18 DIAGNOSIS — E119 Type 2 diabetes mellitus without complications: Secondary | ICD-10-CM | POA: Diagnosis not present

## 2015-08-18 DIAGNOSIS — Z902 Acquired absence of lung [part of]: Secondary | ICD-10-CM | POA: Diagnosis not present

## 2015-08-18 DIAGNOSIS — Z79899 Other long term (current) drug therapy: Secondary | ICD-10-CM | POA: Diagnosis not present

## 2015-08-18 DIAGNOSIS — Z888 Allergy status to other drugs, medicaments and biological substances status: Secondary | ICD-10-CM | POA: Diagnosis not present

## 2015-08-18 DIAGNOSIS — Z7984 Long term (current) use of oral hypoglycemic drugs: Secondary | ICD-10-CM | POA: Diagnosis not present

## 2015-08-18 DIAGNOSIS — C3412 Malignant neoplasm of upper lobe, left bronchus or lung: Secondary | ICD-10-CM | POA: Diagnosis not present

## 2015-08-18 DIAGNOSIS — K219 Gastro-esophageal reflux disease without esophagitis: Secondary | ICD-10-CM | POA: Diagnosis not present

## 2015-08-18 DIAGNOSIS — J449 Chronic obstructive pulmonary disease, unspecified: Secondary | ICD-10-CM | POA: Diagnosis not present

## 2015-08-18 DIAGNOSIS — Z881 Allergy status to other antibiotic agents status: Secondary | ICD-10-CM | POA: Diagnosis not present

## 2015-08-18 DIAGNOSIS — Z87891 Personal history of nicotine dependence: Secondary | ICD-10-CM | POA: Diagnosis not present

## 2015-08-18 DIAGNOSIS — I10 Essential (primary) hypertension: Secondary | ICD-10-CM | POA: Diagnosis not present

## 2015-08-18 DIAGNOSIS — Z809 Family history of malignant neoplasm, unspecified: Secondary | ICD-10-CM | POA: Diagnosis not present

## 2015-08-23 DIAGNOSIS — Z9181 History of falling: Secondary | ICD-10-CM | POA: Diagnosis not present

## 2015-08-23 DIAGNOSIS — R2689 Other abnormalities of gait and mobility: Secondary | ICD-10-CM | POA: Diagnosis not present

## 2015-08-23 DIAGNOSIS — Z483 Aftercare following surgery for neoplasm: Secondary | ICD-10-CM | POA: Diagnosis not present

## 2015-08-23 DIAGNOSIS — E119 Type 2 diabetes mellitus without complications: Secondary | ICD-10-CM | POA: Diagnosis not present

## 2015-08-23 DIAGNOSIS — C3411 Malignant neoplasm of upper lobe, right bronchus or lung: Secondary | ICD-10-CM | POA: Diagnosis not present

## 2015-08-23 DIAGNOSIS — I10 Essential (primary) hypertension: Secondary | ICD-10-CM | POA: Diagnosis not present

## 2015-08-26 ENCOUNTER — Ambulatory Visit
Admission: RE | Admit: 2015-08-26 | Discharge: 2015-08-26 | Disposition: A | Discharge status: Home / Self Care | Payer: Medicare Other | Source: Ambulatory Visit | Attending: Radiation Oncology | Admitting: Radiation Oncology

## 2015-08-26 DIAGNOSIS — Z510 Encounter for antineoplastic radiation therapy: Secondary | ICD-10-CM | POA: Diagnosis not present

## 2015-08-26 DIAGNOSIS — C3412 Malignant neoplasm of upper lobe, left bronchus or lung: Secondary | ICD-10-CM | POA: Insufficient documentation

## 2015-08-30 ENCOUNTER — Encounter: Payer: Self-pay | Admitting: Thoracic Surgery (Cardiothoracic Vascular Surgery)

## 2015-09-05 ENCOUNTER — Other Ambulatory Visit: Payer: Self-pay | Admitting: Thoracic Surgery (Cardiothoracic Vascular Surgery)

## 2015-09-05 DIAGNOSIS — C3490 Malignant neoplasm of unspecified part of unspecified bronchus or lung: Secondary | ICD-10-CM

## 2015-09-05 DIAGNOSIS — Z510 Encounter for antineoplastic radiation therapy: Secondary | ICD-10-CM | POA: Diagnosis not present

## 2015-09-05 DIAGNOSIS — C3412 Malignant neoplasm of upper lobe, left bronchus or lung: Secondary | ICD-10-CM | POA: Diagnosis not present

## 2015-09-06 ENCOUNTER — Ambulatory Visit
Admission: RE | Admit: 2015-09-06 | Discharge: 2015-09-06 | Disposition: A | Discharge status: Home / Self Care | Payer: Medicare Other | Source: Ambulatory Visit | Attending: Thoracic Surgery (Cardiothoracic Vascular Surgery) | Admitting: Thoracic Surgery (Cardiothoracic Vascular Surgery)

## 2015-09-06 ENCOUNTER — Ambulatory Visit (INDEPENDENT_AMBULATORY_CARE_PROVIDER_SITE_OTHER): Payer: Self-pay | Admitting: Thoracic Surgery (Cardiothoracic Vascular Surgery)

## 2015-09-06 ENCOUNTER — Encounter: Payer: Self-pay | Admitting: Thoracic Surgery (Cardiothoracic Vascular Surgery)

## 2015-09-06 VITALS — BP 140/80 | HR 98 | Resp 16 | Ht >= 80 in | Wt 275.0 lb

## 2015-09-06 DIAGNOSIS — C3490 Malignant neoplasm of unspecified part of unspecified bronchus or lung: Secondary | ICD-10-CM

## 2015-09-06 DIAGNOSIS — R918 Other nonspecific abnormal finding of lung field: Secondary | ICD-10-CM | POA: Diagnosis not present

## 2015-09-06 DIAGNOSIS — C3411 Malignant neoplasm of upper lobe, right bronchus or lung: Secondary | ICD-10-CM

## 2015-09-06 DIAGNOSIS — Z902 Acquired absence of lung [part of]: Secondary | ICD-10-CM

## 2015-09-06 MED ORDER — VARENICLINE TARTRATE 0.5 MG X 11 & 1 MG X 42 PO MISC: ORAL | Status: DC

## 2015-09-06 NOTE — Progress Notes (Signed)
[address]       [place] [address]             [phone]       HPI: Jonathan [name] returns today for a scheduled follow-up visit  He is a 75 year old man with a history of tobacco abuse and Agent Orange exposure who presented with bilateral upper lobe lung nodules. Navigational bronchoscopy and biopsy showed both nodules were squamous cell carcinomas. I did a thoracoscopic right upper lobectomy and node dissection on him on 07/04/2015. His postoperative course was complicated by a prolonged air leak and atrial fibrillation. He was eventually discharged home on day 10. He was in sinus rhythm at discharge.  Pathology showed a T1b, N0, stage IA squamous cell carcinoma.  I saw him back in the office on 08/02/2015. Overall is doing pre-well that time.. His primary complaint was abdominal discomfort. That was being worked up. It has improved since that visit  He saw Dr. Lisbeth Renshaw. He opted to do stereotactic radiation for his left upper lobe nodule.That will begin next week.  He still has some incisional discomfort but he is not having to take anything for it.  He is not smoking, but requests a prescription for Chantix.  Past Medical History  Diagnosis Date  . Nicotine addiction   . Obesity   . Dyslipidemia   . Hypertension   . Elevated WBCs September 2011    and UTI Hospitalized   . Erectile dysfunction   . Polyneuropathy in other diseases classified elsewhere ([place]) 12/05/2012  . Lesion of ulnar nerve 12/05/2012    Right side  . Gait disorder   . COPD (chronic obstructive pulmonary disease) ([place])     normally have breathing tx prior to surgery  . Pneumonia     hx  . History of kidney stones   . GERD (gastroesophageal reflux disease)   . Neuropathy ([place])   . Vertigo   . Wears dentures     bottom  . Fatty liver   . Vitamin D deficiency   . Lumbar spinal stenosis   . Gastritis   . Hiatal hernia   . History of colon polyps   . Shortness of breath dyspnea    . Anxiety   . Depression   . Emphysema of lung ([place])   . Diabetes mellitus without complication ([place])     borderline no med, controlled with diet  . Osteoporosis   . Abdominal aortic aneurysm ([place])     pt denies-  . History of kidney stones   . CKD (chronic kidney disease)     pt denies  . Cancer ([place])   . Arthritis     ankle, hands , herniated disc L3-4  . Osteoarthritis      Current Outpatient Prescriptions  Medication Sig Dispense Refill  . albuterol (PROVENTIL HFA;VENTOLIN HFA) 108 (90 BASE) MCG/ACT inhaler Inhale 1 puff into the lungs every 6 (six) hours as needed for wheezing or shortness of breath.    [name] amiodarone (PACERONE) 200 MG tablet Take 1 tablet (200 mg total) by mouth daily. 30 tablet 0  . atenolol (TENORMIN) 25 MG tablet Take 1 tablet (25 mg total) by mouth at bedtime. (Patient taking differently: Take 25 mg by mouth every morning. ) 30 tablet 1  . esomeprazole (NEXIUM) 40 MG capsule Take 40 mg by mouth daily.     [name] gabapentin (NEURONTIN) 400 MG capsule Take 400 mg by mouth 2 (two) times daily as  needed (for neuropathy).     [name] guaiFENesin (MUCINEX) 600 MG 12 hr tablet Take 2 tablets (1,200 mg total) by mouth 2 (two) times daily as needed for cough or to loosen phlegm.    [name] ipratropium-albuterol (DUONEB) 0.5-2.5 (3) MG/3ML SOLN Take 3 mLs by nebulization daily as needed (wheezing).     [name] KLOR-CON M10 10 MEQ tablet Take 1 tablet by mouth daily.    . metFORMIN (GLUCOPHAGE) 500 MG tablet Take 250 mg by mouth daily.     . metoCLOPramide (REGLAN) 5 MG tablet Take 5 mg by mouth 4 (four) times daily -  before meals and at bedtime.     . nicotine polacrilex (NICORETTE) 4 MG gum Take 4 mg by mouth daily.     [name] oxyCODONE (OXY IR/ROXICODONE) 5 MG immediate release tablet Take 1-2 tablets (5-10 mg total) by mouth every 6 (six) hours as needed for severe pain. 30 tablet 0  . Oxycodone HCl 10 MG TABS Take 2.5 mg by mouth 4 (four) times daily as needed.    . predniSONE (DELTASONE)  10 MG tablet Take 10 mg by mouth 2 (two) times daily as needed (for gout symptoms). For gout per patient    . Probiotic Product (PROBIOTIC DAILY PO) Take 1 capsule by mouth daily as needed.    . sennosides-docusate sodium (SENOKOT-S) 8.6-50 MG tablet Take 1 tablet by mouth every other day.    . tamsulosin (FLOMAX) 0.4 MG CAPS capsule Take 0.4 mg by mouth daily as needed. For bladder per patient    . TENORETIC 50 50-25 MG tablet Take 1 tablet by mouth at bedtime.    [name] tiotropium (SPIRIVA) 18 MCG inhalation capsule Place 18 mcg into inhaler and inhale daily as needed (Shortness of breath).     . varenicline (CHANTIX STARTING MONTH PAK) 0.5 MG X 11 & 1 MG X 42 tablet Take one 0.5 mg tablet by mouth once daily for 3 days, then increase to one 0.5 mg tablet twice daily for 4 days, then increase to one 1 mg tablet twice daily. 53 tablet 0   No current facility-administered medications for this visit.   Facility-Administered Medications Ordered in Other Visits  Medication Dose Route Frequency Provider Last Rate Last Dose  . lactated ringers infusion    Continuous PRN Kerby Less, CRNA 0 mL/hr at 07/02/15 2100      Physical Exam BP 140/80 mmHg  Pulse 98  Resp 16  Ht '6\' 8"'$  (2.032 m)  Wt 275 lb (124.739 kg)  BMI 30.21 kg/m2  SpO71 46% 75 year old man in no acute distress Well-developed well-nourished Alert and oriented 3 with no focal deficits Diminished breath sounds right base, otherwise clear Incisions well healed  Diagnostic Tests: CHEST 2 VIEW  COMPARISON: Chest x-ray of 08/02/2015  FINDINGS: No residual right pneumothorax is seen. Pleural parenchymal opacity at the right lung base remains with pleuro diaphragmatic tenting. The left lung is clear. Mediastinal and hilar contours are unremarkable. A small right effusion cannot be excluded. The heart is within normal limits in size. No bony abnormality is seen.  IMPRESSION: 1. No definite pneumothorax on the right. 2.  Stable pleural and parenchymal opacity at the right lung base postoperatively. Cannot exclude small right effusion.   Electronically Signed  By: Ivar Drape M.D.  On: 09/06/2015 10:10 I personally reviewed the chest x-ray. It shows postoperative changes on the right.  Impression: 75 year old man with synchronous bilateral upper lobe squamous cell carcinomas. He had a thoracoscopic right  upper lobectomy. He has opted to go with stereotactic radiation to treat the smaller left upper lobe cancer.  He is recovering well from his surgery. His pain is relatively mild.   As he will be followed closely by Dr. Lisbeth Renshaw and Dr. Jacquiline Doe, he does not need to continue to come all the way down to see me.   Smoking cessation instruction/counseling given:  commended patient for quitting and reviewed strategies for preventing relapses. I did give him a prescription for a Chantix starter pack per his request and warned him of potential side effects including psychiatric.  Plan:  Follow-up with Dr. Lisbeth Renshaw and Dr. Jacquiline Doe  I will be happy to see him back any time in the futureif I can be of any further assistance with his care.  Jonathan Nakayama, MD [facility] [phone]

## 2015-09-07 ENCOUNTER — Encounter: Payer: Self-pay | Admitting: Gastroenterology

## 2015-09-07 ENCOUNTER — Ambulatory Visit (INDEPENDENT_AMBULATORY_CARE_PROVIDER_SITE_OTHER): Payer: Medicare Other | Admitting: Gastroenterology

## 2015-09-07 VITALS — BP 136/70 | HR 88 | Ht 77.25 in | Wt 256.0 lb

## 2015-09-07 DIAGNOSIS — Z860100 Personal history of colon polyps, unspecified: Secondary | ICD-10-CM

## 2015-09-07 DIAGNOSIS — L602 Onychogryphosis: Secondary | ICD-10-CM | POA: Diagnosis not present

## 2015-09-07 DIAGNOSIS — L84 Corns and callosities: Secondary | ICD-10-CM | POA: Diagnosis not present

## 2015-09-07 DIAGNOSIS — B351 Tinea unguium: Secondary | ICD-10-CM | POA: Diagnosis not present

## 2015-09-07 DIAGNOSIS — K5901 Slow transit constipation: Secondary | ICD-10-CM

## 2015-09-07 DIAGNOSIS — E1351 Other specified diabetes mellitus with diabetic peripheral angiopathy without gangrene: Secondary | ICD-10-CM | POA: Diagnosis not present

## 2015-09-07 MED ORDER — PEG-KCL-NACL-NASULF-NA ASC-C 100 G PO SOLR: 1.0000 | Freq: Once | ORAL | Status: AC

## 2015-09-07 NOTE — Progress Notes (Signed)
[name]  Chief Complaint: Abdominal bloating and constipation  Subjective History:  In March 2016 Jonathan [name] wrote: "75 year old African-American male who has history of multiple colon polyps and underwent recent colonoscopy with findings of 9  benign hyperplastic polyps. His prep was suboptimal. We could have missed polyps. He agreed on repeating the colonoscopy within a year because he is concerned about missing colon cancer. A double prep prior to the colonoscopy which will be scheduled for February or March 2017."(poor prep - Moviprep and mag citrate) See NP note from 3/18 for prior extensive evaluation In 2013 had 4 TA and 2 HP at another hospital  He is a rather tangential historian. He has many years of chronic constipation and has an extensive prior workup. If he has not had a bowel movement 2 or 3 days he feels generally "sick" and also side effects his balance. He has many therapies at home include and stool softeners if Rx and laxative powder and says he uses them all, of a "rotating basis". Last night he needs to take 2 enemas to relieve constipation, after which he had a thorough evacuation. He also is on Reglan for unclear reasons and says he was diagnosed with gastroparesis. There is no gastric emptying study available in Epic imaging reports. He also says that he has a standing prescription for prednisone from his primary care doctor to take at times when he "feels bad". He denies rectal bleeding, his appetite is good and his weight stable.  ROS: Cardiovascular:  no chest pain Respiratory: no dyspnea He has chronic neuropathic and musculoskeletal pain The patient's Past Medical, Family and Social History were reviewed and are on file in the EMR.  Objective:  Med list reviewed  Vital signs in last 24 hrs: Filed Vitals:   09/07/15 0922  BP: 136/70  Pulse: 88    Physical Exam   HEENT: sclera anicteric, oral mucosa moist without lesions  Neck: supple, no  thyromegaly, JVD or lymphadenopathy  Cardiac: RRR without murmurs, S1S2 heard, no peripheral edema  Pulm: clear to auscultation bilaterally, normal RR and effort noted  Abdomen: soft, No tenderness, with active bowel sounds. No guarding or palpable hepatosplenomegaly.  Skin; warm and dry, no jaundice or rash     '@ASSESSMENTPLANBEGIN'$ @ Assessment: Encounter Diagnoses  Name Primary?  . Slow transit constipation Yes  . History of colonic polyps    He appears to have functional constipation exacerbated by use of chronic narcotic analgesics. I don't know that he truly has gastroparesis, and I cannot advocate a continued use of Reglan in this patient, especially considering the long-term risk of tardive dyskinesia. I made him aware of all of this. His last colonoscopy had a poor preparation, thus it is reasonable to repeat it because of his history of adenomatous colon polyps in 2013.   Plan:  Colonoscopy  The benefits and risks of the planned procedure were described in detail with the patient or (when appropriate) their health care proxy.  Risks were outlined as including, but not limited to, bleeding, infection, perforation, adverse medication reaction leading to cardiac or pulmonary decompensation, or pancreatitis (if ERCP).  The limitation of incomplete mucosal visualization was also discussed.  No guarantees or warranties were given.   Jonathan [name]

## 2015-09-07 NOTE — Patient Instructions (Signed)
If you are age 74 or older, your body mass index should be between 23-30. Your Body mass index is 30.17 kg/(m^2). If this is out of the aforementioned range listed, please consider follow up with your Primary Care Provider.  If you are age 32 or younger, your body mass index should be between 19-25. Your Body mass index is 30.17 kg/(m^2). If this is out of the aformentioned range listed, please consider follow up with your Primary Care Provider.   You have been scheduled for a colonoscopy. Please follow written instructions given to you at your visit today.  Please pick up your prep supplies at the pharmacy within the next 1-3 days. If you use inhalers (even only as needed), please bring them with you on the day of your procedure. Your physician has requested that you go to www.startemmi.com and enter the access code given to you at your visit today. This web site gives a general overview about your procedure. However, you should still follow specific instructions given to you by our office regarding your preparation for the procedure.  Thank you for choosing [facility]  Dr Wilfrid Lund III

## 2015-09-08 DIAGNOSIS — M19271 Secondary osteoarthritis, right ankle and foot: Secondary | ICD-10-CM | POA: Diagnosis not present

## 2015-09-14 ENCOUNTER — Ambulatory Visit
Admission: RE | Admit: 2015-09-14 | Discharge: 2015-09-14 | Disposition: A | Discharge status: Home / Self Care | Payer: Medicare Other | Source: Ambulatory Visit | Attending: Radiation Oncology | Admitting: Radiation Oncology

## 2015-09-14 DIAGNOSIS — Z510 Encounter for antineoplastic radiation therapy: Secondary | ICD-10-CM | POA: Diagnosis not present

## 2015-09-14 DIAGNOSIS — C3412 Malignant neoplasm of upper lobe, left bronchus or lung: Secondary | ICD-10-CM | POA: Diagnosis not present

## 2015-09-19 ENCOUNTER — Ambulatory Visit
Admission: RE | Admit: 2015-09-19 | Discharge: 2015-09-19 | Disposition: A | Discharge status: Home / Self Care | Payer: Medicare Other | Source: Ambulatory Visit | Attending: Radiation Oncology | Admitting: Radiation Oncology

## 2015-09-19 DIAGNOSIS — Z510 Encounter for antineoplastic radiation therapy: Secondary | ICD-10-CM | POA: Diagnosis not present

## 2015-09-19 DIAGNOSIS — C3412 Malignant neoplasm of upper lobe, left bronchus or lung: Secondary | ICD-10-CM | POA: Diagnosis not present

## 2015-09-20 ENCOUNTER — Ambulatory Visit: Payer: Medicare Other | Admitting: Radiation Oncology

## 2015-09-21 ENCOUNTER — Encounter: Payer: Self-pay | Admitting: Radiation Oncology

## 2015-09-21 ENCOUNTER — Ambulatory Visit
Admission: RE | Admit: 2015-09-21 | Discharge: 2015-09-21 | Disposition: A | Discharge status: Home / Self Care | Payer: Medicare Other | Source: Ambulatory Visit | Attending: Radiation Oncology | Admitting: Radiation Oncology

## 2015-09-21 DIAGNOSIS — Z510 Encounter for antineoplastic radiation therapy: Secondary | ICD-10-CM | POA: Diagnosis not present

## 2015-09-21 DIAGNOSIS — C3412 Malignant neoplasm of upper lobe, left bronchus or lung: Secondary | ICD-10-CM | POA: Insufficient documentation

## 2015-09-21 NOTE — Progress Notes (Signed)
Department of Radiation Oncology  Phone:  [phone] Fax:        [phone]  Weekly Treatment Note    Name: Jonathan [name] Date: 09/21/2015 MRN: [id number] DOB: Nov 13, 1940   Current dose: 54 Gy  Current fraction: 3   MEDICATIONS: Current Outpatient Prescriptions  Medication Sig Dispense Refill  . albuterol (PROVENTIL HFA;VENTOLIN HFA) 108 (90 BASE) MCG/ACT inhaler Inhale 1 puff into the lungs every 6 (six) hours as needed for wheezing or shortness of breath.    [name] amiodarone (PACERONE) 200 MG tablet Take 1 tablet (200 mg total) by mouth daily. 30 tablet 0  . atenolol (TENORMIN) 25 MG tablet Take 1 tablet (25 mg total) by mouth at bedtime. (Patient taking differently: Take 25 mg by mouth every morning. ) 30 tablet 1  . esomeprazole (NEXIUM) 40 MG capsule Take 40 mg by mouth daily.     [name] gabapentin (NEURONTIN) 400 MG capsule Take 400 mg by mouth 2 (two) times daily as needed (for neuropathy).     [name] ipratropium-albuterol (DUONEB) 0.5-2.5 (3) MG/3ML SOLN Take 3 mLs by nebulization daily as needed (wheezing).     [name] KLOR-CON M10 10 MEQ tablet Take 1 tablet by mouth daily.    . metFORMIN (GLUCOPHAGE) 500 MG tablet Take 250 mg by mouth daily.     . metoCLOPramide (REGLAN) 5 MG tablet Take 5 mg by mouth 4 (four) times daily -  before meals and at bedtime.     . Oxycodone HCl 10 MG TABS Take 2.5 mg by mouth 4 (four) times daily as needed.    . peg 3350 powder (MOVIPREP) 100 g SOLR Take 1 kit (200 g total) by mouth once. 1 kit 0  . predniSONE (DELTASONE) 10 MG tablet Take 10 mg by mouth 2 (two) times daily as needed (for gout symptoms). For gout per patient    . Probiotic Product (PROBIOTIC DAILY PO) Take 1 capsule by mouth daily as needed.    . sennosides-docusate sodium (SENOKOT-S) 8.6-50 MG tablet Take 1 tablet by mouth every other day.    . sildenafil (VIAGRA) 100 MG tablet Take 100 mg by mouth daily as needed.    . tamsulosin (FLOMAX) 0.4 MG CAPS capsule Take 0.4 mg by mouth daily  as needed. For bladder per patient    . TENORETIC 50 50-25 MG tablet Take 1 tablet by mouth at bedtime.    [name] tiotropium (SPIRIVA) 18 MCG inhalation capsule Place 18 mcg into inhaler and inhale daily as needed (Shortness of breath).     . varenicline (CHANTIX STARTING MONTH PAK) 0.5 MG X 11 & 1 MG X 42 tablet Take one 0.5 mg tablet by mouth once daily for 3 days, then increase to one 0.5 mg tablet twice daily for 4 days, then increase to one 1 mg tablet twice daily. 53 tablet 0   No current facility-administered medications for this encounter.   Facility-Administered Medications Ordered in Other Encounters  Medication Dose Route Frequency Provider Last Rate Last Dose  . lactated ringers infusion    Continuous PRN Kerby Less, CRNA 0 mL/hr at 07/02/15 2100       ALLERGIES: Lisinopril; Onion; Ciprofloxacin; Levaquin; and Triamterene-hctz   LABORATORY DATA:  Lab Results  Component Value Date   WBC 8.0 07/27/2015   HGB 13.0 07/27/2015   HCT 38.9* 07/27/2015   MCV 92.2 07/27/2015   PLT 298 07/27/2015   Lab Results  Component Value Date   NA 138 07/27/2015   K 3.9  07/27/2015   CL 105 07/27/2015   CO2 22 07/27/2015   Lab Results  Component Value Date   ALT 18 07/27/2015   AST 19 07/27/2015   ALKPHOS 61 07/27/2015   BILITOT 0.4 07/27/2015     NARRATIVE: Jonathan [name] was seen today for weekly treatment management. The chart was checked and the patient's films were reviewed.  Weekly SBRT 3/3 completed lung. No complaint of pain, nausea, or coughing. He mentions his appetite is good. He mentions his energy level is pretty good. He was given a one month appointment.   PHYSICAL EXAMINATION: vitals were not taken for this visit.   Vitals with BMI 09/21/2015  Height   Weight 256 lbs 5 oz  BMI   Systolic 824  Diastolic 91  Pulse 235  Respirations 20    ASSESSMENT: The patient did satisfactorily with treatment.  PLAN: The patient will follow-up in our clinic in 1  month.    ------------------------------------------------  Jodelle Gross, MD, PhD    This document serves as a record of services personally performed by Kyung Rudd, MD. It was created on his behalf by  Lendon Collar, a trained medical scribe. The creation of this record is based on the scribe's personal observations and the provider's statements to them. This document has been checked and approved by the attending provider.

## 2015-09-21 NOTE — Progress Notes (Signed)
Weekly SBRT 3/3 completed lung,. No c/o pain, nausea or coughing, appetite good, energy level pretty  Good 1 month appt given BP 144/91 mmHg  Pulse 109  Temp(Src) 98 F (36.7 C) (Oral)  Resp 20  Wt 256 lb 4.8 oz (116.257 kg)  SpO2 100%  Wt Readings from Last 3 Encounters:  09/21/15 256 lb 4.8 oz (116.257 kg)  09/07/15 256 lb (116.121 kg)  09/06/15 275 lb (124.739 kg)

## 2015-09-22 ENCOUNTER — Ambulatory Visit: Payer: Medicare Other | Admitting: Radiation Oncology

## 2015-09-26 ENCOUNTER — Telehealth: Payer: Self-pay | Admitting: *Deleted

## 2015-09-26 NOTE — Telephone Encounter (Signed)
Returned patient call, he went to [employer] in [place] and walked around ,got short of breath and went home, wants MD to order Oxygen for him, he also called Dr. Cheryln Manly, but Md was in surgery, he took his spiriva and has his albuterol to use if needed he stated, Ihis f/u appt is for August 3 here  At the cancer center, , he said Dr. Lisbeth Renshaw could see him in [place] as a follow up, I will let MD know but, he will need to call his Primary MD or Lung Dr. To see about getting Oxygen, they need to have him ambulate  Without oxygen to see if his level drops below a certain level before that can be ordered, he asked that hios follow up appt be changed to [facility], and stated if he got too short of breath again he would go to the ED,  3:37 PM

## 2015-10-03 DIAGNOSIS — E1165 Type 2 diabetes mellitus with hyperglycemia: Secondary | ICD-10-CM | POA: Diagnosis not present

## 2015-10-03 DIAGNOSIS — C3431 Malignant neoplasm of lower lobe, right bronchus or lung: Secondary | ICD-10-CM | POA: Diagnosis not present

## 2015-10-03 DIAGNOSIS — E119 Type 2 diabetes mellitus without complications: Secondary | ICD-10-CM | POA: Diagnosis not present

## 2015-10-03 DIAGNOSIS — Z6829 Body mass index (BMI) 29.0-29.9, adult: Secondary | ICD-10-CM | POA: Diagnosis not present

## 2015-10-06 ENCOUNTER — Encounter: Payer: Self-pay | Admitting: Radiation Oncology

## 2015-10-06 NOTE — Progress Notes (Signed)
°  Radiation Oncology         [phone] ________________________________  Name: Jonathan [name] MRN: [id number]  Date: 09/21/2015  DOB: [birth year]  End of Treatment Note  Diagnosis:   Primary malignant neoplasm of left upper lobe of lung [facility])     Indication for treatment:  Curative       Radiation treatment dates:   09/14/2015 to 09/21/2015  Site/dose:   The Left lung was treated to 54 Gy in 3 fractions at 18 Gy per fraction.  Beams/energy:   SBRT/SRT-3D // 6FFF  Narrative: The patient tolerated radiation treatment relatively well.   The patient did not experience any pain, nausea, or coughing. He reported a good appetite and energy level.  Plan: The patient has completed radiation treatment. The patient will return to radiation oncology clinic for routine followup in one month. I advised them to call or return sooner if they have any questions or concerns related to their recovery or treatment.  ------------------------------------------------  Jodelle Gross, MD, PhD  This document serves as a record of services personally performed by Kyung Rudd, MD. It was created on his behalf by Arlyce Harman, a trained medical scribe. The creation of this record is based on the scribe's personal observations and the provider's statements to them. This document has been checked and approved by the attending provider.

## 2015-10-11 ENCOUNTER — Telehealth: Payer: Self-pay | Admitting: *Deleted

## 2015-10-11 NOTE — Telephone Encounter (Signed)
CALLED PATIENT TO ASK ABOUT MOVING FU APPT. FROM 10-27-15 TO 10-18-15 @ 11 AM WITH ALISON PERKINS, PATIENT AGREED TO NEW TIME AND DAY

## 2015-10-12 ENCOUNTER — Encounter: Payer: Self-pay | Admitting: Gastroenterology

## 2015-10-12 ENCOUNTER — Ambulatory Visit (AMBULATORY_SURGERY_CENTER): Payer: Medicare Other | Admitting: Gastroenterology

## 2015-10-12 VITALS — BP 108/57 | HR 60 | Temp 97.1°F | Resp 21 | Ht 77.25 in | Wt 256.0 lb

## 2015-10-12 DIAGNOSIS — K5900 Constipation, unspecified: Secondary | ICD-10-CM | POA: Diagnosis not present

## 2015-10-12 DIAGNOSIS — K5901 Slow transit constipation: Secondary | ICD-10-CM

## 2015-10-12 DIAGNOSIS — D120 Benign neoplasm of cecum: Secondary | ICD-10-CM | POA: Diagnosis not present

## 2015-10-12 DIAGNOSIS — E119 Type 2 diabetes mellitus without complications: Secondary | ICD-10-CM | POA: Diagnosis not present

## 2015-10-12 DIAGNOSIS — J449 Chronic obstructive pulmonary disease, unspecified: Secondary | ICD-10-CM | POA: Diagnosis not present

## 2015-10-12 DIAGNOSIS — K635 Polyp of colon: Secondary | ICD-10-CM | POA: Diagnosis not present

## 2015-10-12 DIAGNOSIS — Z86010 Personal history of colonic polyps: Secondary | ICD-10-CM | POA: Diagnosis not present

## 2015-10-12 DIAGNOSIS — I10 Essential (primary) hypertension: Secondary | ICD-10-CM | POA: Diagnosis not present

## 2015-10-12 LAB — GLUCOSE, CAPILLARY
Glucose-Capillary: 105 mg/dL — ABNORMAL HIGH (ref 65–99)
Glucose-Capillary: 101 mg/dL — ABNORMAL HIGH (ref 65–99)

## 2015-10-12 MED ORDER — SODIUM CHLORIDE 0.9 % IV SOLN: 500.0000 mL | INTRAVENOUS | Status: DC

## 2015-10-12 NOTE — Progress Notes (Signed)
Called to room to assist during endoscopic procedure.  Patient ID and intended procedure confirmed with present staff. Received instructions for my participation in the procedure from the performing physician.  

## 2015-10-12 NOTE — Op Note (Signed)
[place] Patient Name: Jonathan [name] Procedure Date: 10/12/2015 2:06 PM MRN: [id number] Endoscopist: Mallie Mussel L. Loletha Carrow , MD Age: 75 Referring MD:  Date of Birth: 04-Feb-1941 Gender: Male Account #: [id number] Procedure:                Colonoscopy Indications:              High risk colon cancer surveillance: Personal                            history of colonic polyps (5 tubular adenoma in                            11/2011, hyperplastic polyps 05/2014 with poor bowel                            preparation), Incidental constipation noted Medicines:                Monitored Anesthesia Care Procedure:                Pre-Anesthesia Assessment:                           - Prior to the procedure, a History and Physical                            was performed, and patient medications and                            allergies were reviewed. The patient's tolerance of                            previous anesthesia was also reviewed. The risks                            and benefits of the procedure and the sedation                            options and risks were discussed with the patient.                            All questions were answered, and informed consent                            was obtained. Prior Anticoagulants: The patient has                            taken no previous anticoagulant or antiplatelet                            agents. ASA Grade Assessment: III - A patient with                            severe systemic disease. After reviewing the risks  and benefits, the patient was deemed in                            satisfactory condition to undergo the procedure.                           After obtaining informed consent, the colonoscope                            was passed under direct vision. Throughout the                            procedure, the patient's blood pressure, pulse, and                            oxygen saturations  were monitored continuously. The                            Model CF-HQ190L [id number]) scope was introduced                            through the anus and advanced to the the cecum,                            identified by appendiceal orifice and ileocecal                            valve. The colonoscopy was performed without                            difficulty. The patient tolerated the procedure                            well. The quality of the bowel preparation was                            poor. The ileocecal valve, appendiceal orifice, and                            rectum were photographed. The bowel preparation                            used was MoviPrep. Scope In: 2:11:19 PM Scope Out: 2:23:24 PM Scope Withdrawal Time: 0 hours 10 minutes 25 seconds  Total Procedure Duration: 0 hours 12 minutes 5 seconds  Findings:                 The perianal and digital rectal examinations were                            normal.                           A 4 mm polyp was found in the cecum. The polyp was  sessile. The polyp was removed with a piecemeal                            technique using a cold biopsy forceps. Resection                            and retrieval were complete.                           The exam was otherwise without abnormality on                            direct and retroflexion views. Complications:            No immediate complications. Estimated Blood Loss:     Estimated blood loss: none. Impression:               - Preparation of the colon was poor.                           - One 4 mm polyp in the cecum, removed piecemeal                            using a cold biopsy forceps. Resected and retrieved.                           - The examination was otherwise normal on direct                            and retroflexion views.                           - Constipation appears due to chronic narcotic use. Recommendation:           -  Patient has a contact number available for                            emergencies. The signs and symptoms of potential                            delayed complications were discussed with the                            patient. Return to normal activities tomorrow.                            Written discharge instructions were provided to the                            patient.                           - Resume previous diet.                           - Continue present medications.                           -  Await pathology results.                           - Repeat colonoscopy is recommended for                            surveillance. The colonoscopy date will be                            determined after pathology results from today's                            exam become available for review. Henry L. Loletha Carrow, MD 10/12/2015 2:29:52 PM This report has been signed electronically.

## 2015-10-12 NOTE — Progress Notes (Signed)
To recovery, report to McCoy, RN, VSS 

## 2015-10-12 NOTE — Patient Instructions (Signed)
Discharge instructions given. Handout on polyps. Resume previous medications. YOU HAD AN ENDOSCOPIC PROCEDURE TODAY AT THE [facility]:   Refer to the procedure report that was given to you for any specific questions about what was found during the examination.  If the procedure report does not answer your questions, please call your gastroenterologist to clarify.  If you requested that your care partner not be given the details of your procedure findings, then the procedure report has been included in a sealed envelope for you to review at your convenience later.  YOU SHOULD EXPECT: Some feelings of bloating in the abdomen. Passage of more gas than usual.  Walking can help get rid of the air that was put into your GI tract during the procedure and reduce the bloating. If you had a lower endoscopy (such as a colonoscopy or flexible sigmoidoscopy) you may notice spotting of blood in your stool or on the toilet paper. If you underwent a bowel prep for your procedure, you may not have a normal bowel movement for a few days.  Please Note:  You might notice some irritation and congestion in your nose or some drainage.  This is from the oxygen used during your procedure.  There is no need for concern and it should clear up in a day or so.  SYMPTOMS TO REPORT IMMEDIATELY:   Following lower endoscopy (colonoscopy or flexible sigmoidoscopy):  Excessive amounts of blood in the stool  Significant tenderness or worsening of abdominal pains  Swelling of the abdomen that is new, acute  Fever of 100F or higher   For urgent or emergent issues, a gastroenterologist can be reached at any hour by calling [phone].   DIET: Your first meal following the procedure should be a small meal and then it is ok to progress to your normal diet. Heavy or fried foods are harder to digest and may make you feel nauseous or bloated.  Likewise, meals heavy in dairy and vegetables can increase bloating.  Drink  plenty of fluids but you should avoid alcoholic beverages for 24 hours.  ACTIVITY:  You should plan to take it easy for the rest of today and you should NOT DRIVE or use heavy machinery until tomorrow (because of the sedation medicines used during the test).    FOLLOW UP: Our staff will call the number listed on your records the next business day following your procedure to check on you and address any questions or concerns that you may have regarding the information given to you following your procedure. If we do not reach you, we will leave a message.  However, if you are feeling well and you are not experiencing any problems, there is no need to return our call.  We will assume that you have returned to your regular daily activities without incident.  If any biopsies were taken you will be contacted by phone or by letter within the next 1-3 weeks.  Please call us at [phone] if you have not heard about the biopsies in 3 weeks.    SIGNATURES/CONFIDENTIALITY: You and/or your care partner have signed paperwork which will be entered into your electronic medical record.  These signatures attest to the fact that that the information above on your After Visit Summary has been reviewed and is understood.  Full responsibility of the confidentiality of this discharge information lies with you and/or your care-partner. 

## 2015-10-13 ENCOUNTER — Telehealth: Payer: Self-pay

## 2015-10-13 NOTE — Telephone Encounter (Signed)
  Follow up Call-  Call back number 10/12/2015 06/23/2014  Post procedure Call Back phone  # [phone] hm [phone]  Permission to leave phone message Yes Yes    Patient was called for follow up phone call after his procedure on 10/12/2015. I spoke with Jonathan [name]'s wife and she reports that Kevan has returned to his normal daily activities without any difficulty.

## 2015-10-15 NOTE — Progress Notes (Signed)
  Radiation Oncology         [phone] ________________________________  Name: Jonathan [name] MRN: [id number]  Date: 08/26/2015  DOB: [birth year]  RESPIRATORY MOTION MANAGEMENT SIMULATION  NARRATIVE:  In order to account for effect of respiratory motion on target structures and other organs in the planning and delivery of radiotherapy, this patient underwent respiratory motion management simulation.  To accomplish this, when the patient was brought to the CT simulation planning suite, 4D respiratoy motion management CT images were obtained.  The CT images were loaded into the planning software.  Then, using a variety of tools including Cine, MIP, and standard views, the target volume and planning target volumes (PTV) were delineated.  Avoidance structures were contoured.  Treatment planning then occurred.  Dose volume histograms were generated and reviewed for each of the requested structure.  The resulting plan was carefully reviewed and approved today.  ------------------------------------------------  Jodelle Gross, MD, PhD

## 2015-10-15 NOTE — Progress Notes (Signed)
[facility] Radiation [facility] Simulation and Treatment Planning Note   Name:  '@PATNAME'$ @ MRN: [id number]   Date: 10/15/2015  DOB: [birth year]  Status:outpatient    DIAGNOSIS: '@CURRDX'$ @   CONSENT VERIFIED:yes   SET UP: Patient is setup supine   IMMOBILIZATION: The patient was immobilized using a Vac Loc bag and Abdominal Compression.   NARRATIVE:The patient was brought to the [facility].  Identity was confirmed.  All relevant records and images related to the planned course of therapy were reviewed.  Then, the patient was positioned in a stable reproducible clinical set-up for radiation therapy. Abdominal compression was applied by me.  4D CT images were obtained and reproducible breathing pattern was confirmed. Free breathing CT images were obtained.  Skin markings were placed.  The CT images were loaded into the planning software where the target and avoidance structures were contoured.  The radiation prescription was entered and confirmed.    TREATMENT PLANNING NOTE:  Treatment planning then occurred. I have requested : MLC's, isodose plan, basic dose calculation.  3 dimensional simulation is performed and dose volume histogram of the gross tumor volume, planning tumor volume and criticial normal structures including the spinal cord and lungs were analyzed and requested.  Special treatment procedure was performed due to high dose per fraction.  The patient will be monitored for increased risk of toxicity.  Daily imaging using cone beam CT will be used for target localization.  The tumor in the left lung will receive 54 gray in 3 fractions at 18 gray per fraction.  ------------------------------------------------  Jodelle Gross, MD, PhD

## 2015-10-15 NOTE — Addendum Note (Signed)
Encounter addended by: Kyung Rudd, MD on: 10/15/2015 11:09 PM<BR>     Documentation filed: Notes Section, Visit Diagnoses

## 2015-10-18 ENCOUNTER — Ambulatory Visit
Admission: RE | Admit: 2015-10-18 | Discharge: 2015-10-18 | Disposition: A | Discharge status: Home / Self Care | Payer: Medicare Other | Source: Ambulatory Visit | Attending: Radiation Oncology | Admitting: Radiation Oncology

## 2015-10-18 ENCOUNTER — Other Ambulatory Visit: Payer: Self-pay | Admitting: Radiation Oncology

## 2015-10-18 ENCOUNTER — Encounter: Payer: Self-pay | Admitting: Radiation Oncology

## 2015-10-18 VITALS — BP 136/77 | HR 87 | Temp 98.1°F | Resp 18 | Ht 77.25 in | Wt 251.9 lb

## 2015-10-18 DIAGNOSIS — C3412 Malignant neoplasm of upper lobe, left bronchus or lung: Secondary | ICD-10-CM | POA: Insufficient documentation

## 2015-10-18 DIAGNOSIS — R0789 Other chest pain: Secondary | ICD-10-CM | POA: Diagnosis not present

## 2015-10-18 DIAGNOSIS — M545 Low back pain: Secondary | ICD-10-CM | POA: Insufficient documentation

## 2015-10-18 DIAGNOSIS — Z7984 Long term (current) use of oral hypoglycemic drugs: Secondary | ICD-10-CM | POA: Diagnosis not present

## 2015-10-18 DIAGNOSIS — Z79899 Other long term (current) drug therapy: Secondary | ICD-10-CM | POA: Diagnosis not present

## 2015-10-18 DIAGNOSIS — Z888 Allergy status to other drugs, medicaments and biological substances status: Secondary | ICD-10-CM | POA: Insufficient documentation

## 2015-10-18 LAB — BASIC METABOLIC PANEL
Anion Gap: 9 mEq/L (ref 3–11)
BUN: 9.2 mg/dL (ref 7.0–26.0)
CO2: 28 mEq/L (ref 22–29)
Calcium: 10 mg/dL (ref 8.4–10.4)
Chloride: 102 mEq/L (ref 98–109)
Creatinine: 1.4 mg/dL — ABNORMAL HIGH (ref 0.7–1.3)
EGFR: 58 mL/min/{1.73_m2} — ABNORMAL LOW (ref >90)
Glucose: 90 mg/dl (ref 70–140)
Potassium: 3.8 mEq/L (ref 3.5–5.1)
Sodium: 140 mEq/L (ref 136–145)

## 2015-10-18 NOTE — Progress Notes (Addendum)
Weight changes, if any:  Wt Readings from Last 3 Encounters:  10/18/15 251 lb 14.4 oz (114.3 kg)  10/12/15 256 lb (116.1 kg)  09/21/15 256 lb 4.8 oz (116.3 kg)   Respiratory complaints, if any: Coughing clear secretions at times,wheezing if he gets real hot, Denies SOB Hemoptysis, if any: None  Swallowing Problems/Pain/Difficulty swallowing:None Smoking Tobacco/Marijuana/Snuff/ETOH use: Quit 02-13-15 cigarettes 1/2 pack/day Skin:Hyperpigmentation across left chest Pain : None Appetite:Good Fatigue:All during the day When is next chemo scheduled?:N/A Lab work from of chart:10-12-15 glucose BP 136/77   Pulse 87   Temp 98.1 F (36.7 C) (Oral)   Resp 18   Ht 6' 5.25" (1.962 m)   Wt 251 lb 14.4 oz (114.3 kg)   SpO2 98%   BMI 29.68 kg/m

## 2015-10-18 NOTE — Progress Notes (Signed)
Radiation Oncology         [phone] ________________________________  Name: Jonathan [name] MRN: [id number]  Date: 10/18/2015  DOB: 09/18/1940  Follow-Up Visit Note  CC: Celedonio Savage, MD  Celedonio Savage, MD  Diagnosis:  Stage IA, T1, N0, NSCLC, squamous cell carcinoma of the left upper lobe.  Interval Since Last Radiation:  4 weeks   09/14/2015 to 09/21/2015: The Left lung was treated with SBRT to 54 Gy in 3 fractions at 18 Gy per fraction.   Narrative:  The patient returns today for routine follow-up. He tolerated radiotherapy well, and also continues to do well since his lobectomy has been performed of the right upper lobe.                          On review of systems, the patient states he is doing pretty well overall. From time to time he does experience a cough that is slightly productive with clear to yellow mucus. He denies any hemoptysis. He denies any shortness of breath, but does feel some chest tightness along the right upper aspect of his chest since his lobectomy. He denies any sternal border crushing chest pain. He continues to have right shoulder and low back pain and continues to take oxycodone that his primary provider prescribed at the [place]. No other complaints or verbalized.   ALLERGIES:  is allergic to lisinopril; onion; ciprofloxacin; levaquin [levofloxacin]; and triamterene-hctz.  Meds: Current Outpatient Prescriptions  Medication Sig Dispense Refill  . albuterol (PROVENTIL HFA;VENTOLIN HFA) 108 (90 BASE) MCG/ACT inhaler Inhale 1 puff into the lungs every 6 (six) hours as needed for wheezing or shortness of breath.    [name] amiodarone (PACERONE) 200 MG tablet Take 1 tablet (200 mg total) by mouth daily. 30 tablet 0  . atenolol (TENORMIN) 25 MG tablet Take 1 tablet (25 mg total) by mouth at bedtime. (Patient taking differently: Take 25 mg by mouth every morning. ) 30 tablet 1  . esomeprazole (NEXIUM) 40 MG capsule Take 40 mg by mouth daily.     [name] gabapentin  (NEURONTIN) 400 MG capsule Take 400 mg by mouth 2 (two) times daily as needed (for neuropathy). Reported on 10/12/2015    . ipratropium-albuterol (DUONEB) 0.5-2.5 (3) MG/3ML SOLN Take 3 mLs by nebulization daily as needed (wheezing).     [name] KLOR-CON M10 10 MEQ tablet Take 1 tablet by mouth daily.    . metFORMIN (GLUCOPHAGE) 500 MG tablet Take 250 mg by mouth daily.     . metoCLOPramide (REGLAN) 5 MG tablet Take 5 mg by mouth 4 (four) times daily -  before meals and at bedtime.     . Oxycodone HCl 10 MG TABS Take 2.5 mg by mouth 4 (four) times daily as needed.    . Probiotic Product (PROBIOTIC DAILY PO) Take 1 capsule by mouth daily as needed.    . sennosides-docusate sodium (SENOKOT-S) 8.6-50 MG tablet Take 1 tablet by mouth every other day.    . sildenafil (VIAGRA) 100 MG tablet Take 100 mg by mouth daily as needed.    . tamsulosin (FLOMAX) 0.4 MG CAPS capsule Take 0.4 mg by mouth daily as needed. Reported on 10/12/2015    . TENORETIC 50 50-25 MG tablet Take 1 tablet by mouth at bedtime. Reported on 10/12/2015    . tiotropium (SPIRIVA) 18 MCG inhalation capsule Place 18 mcg into inhaler and inhale daily as needed (Shortness of breath). Reported on 10/12/2015    .  predniSONE (DELTASONE) 10 MG tablet Take 10 mg by mouth 2 (two) times daily as needed (for gout symptoms). For gout per patient    . varenicline (CHANTIX STARTING MONTH PAK) 0.5 MG X 11 & 1 MG X 42 tablet Take one 0.5 mg tablet by mouth once daily for 3 days, then increase to one 0.5 mg tablet twice daily for 4 days, then increase to one 1 mg tablet twice daily. (Patient not taking: Reported on 10/12/2015) 53 tablet 0   No current facility-administered medications for this encounter.    Facility-Administered Medications Ordered in Other Encounters  Medication Dose Route Frequency Provider Last Rate Last Dose  . lactated ringers infusion    Continuous PRN Kerby Less, CRNA 0 mL/hr at 07/02/15 2100      Physical Findings:  height is  6' 5.25" (1.962 m) and weight is 251 lb 14.4 oz (114.3 kg). His oral temperature is 98.1 F (36.7 C). His blood pressure is 136/77 and his pulse is 87. His respiration is 18 and oxygen saturation is 98%.  In general this is a well appearing African-American male in no acute distress. He's alert and oriented x4 and appropriate throughout the examination. Cardiopulmonary assessment is negative for acute distress and he exhibits normal effort.     Lab Findings: Lab Results  Component Value Date   WBC 8.0 07/27/2015   HGB 13.0 07/27/2015   HCT 38.9 (L) 07/27/2015   MCV 92.2 07/27/2015   PLT 298 07/27/2015     Radiographic Findings: No results found.  Impression/Plan: 1. Stage IA, T1, N0, NSCLC, squamous cell carcinoma of the left upper lobe. The patient has done well and tolerating his radiotherapy, we discussed the rationale for close follow-up evaluation, and would recommend CT scan as a baseline in about 2 weeks.I will follow up with the results by phone, and if this is favorable, we removed to scans every 6 months with CT and follow-up thereafter. He is interested in keeping his scans in [place], but follow up with Dr. Lisbeth Renshaw in [place]. The patient states agreement and understanding. He is encouraged to call if he has any questions or concerns prior to his next visit. We did discuss precaution regarding radiation pneumonitis despite the fact that this be extremely rare for this patient given the type of radiotherapy received.     Carola Rhine, PAC

## 2015-10-24 ENCOUNTER — Telehealth: Payer: Self-pay | Admitting: *Deleted

## 2015-10-24 ENCOUNTER — Encounter: Payer: Self-pay | Admitting: Gastroenterology

## 2015-10-24 NOTE — Telephone Encounter (Signed)
CALLED PATIENT TO INFORM OF CT FOR 11-01-15- ARRIVAL TIME - 1:45 PM , NO RESTRICTIONS TO TEST @ [facility], SPOKE WITH PATIENT AND HE IS AWARE OF THIS TEST

## 2015-10-25 ENCOUNTER — Telehealth: Payer: Self-pay | Admitting: *Deleted

## 2015-10-25 ENCOUNTER — Encounter: Payer: Self-pay | Admitting: Gastroenterology

## 2015-10-25 NOTE — Telephone Encounter (Signed)
Returned call from [name].  He wanted to know if his lab values from his recent B-met on 10/18/15 were "okay" for him to have his CT scan on 11/01/15. His BUN was 9.2, Creatinine 1.4 ( high normal 1.3) and EGFR of 58.  Called Interventional Radiology at [facility] and spoke to Chaney Born, Radiology, CT Technologist.  She confirmed that these labs were within the parameters for Jonathan [name] to proceed with the CT scan with contrast.  He also takes Metformin, but reported that he stopped at least 3 weeks ago or more and Chaney Born informed.  He stated he would check his labs on [facility]Everywhere so that he can "re-check" with the Radiology staff prior to his CT Scan.  Reminded [name] to arrive at 1:30pm for his 2:00 pm CT.  He stated he would and expressed thanks

## 2015-10-26 DIAGNOSIS — S161XXA Strain of muscle, fascia and tendon at neck level, initial encounter: Secondary | ICD-10-CM | POA: Diagnosis not present

## 2015-10-26 DIAGNOSIS — S40012A Contusion of left shoulder, initial encounter: Secondary | ICD-10-CM | POA: Diagnosis not present

## 2015-10-27 ENCOUNTER — Inpatient Hospital Stay: Admission: RE | Admit: 2015-10-27 | Payer: Self-pay | Source: Ambulatory Visit | Admitting: Radiation Oncology

## 2015-10-27 DIAGNOSIS — I129 Hypertensive chronic kidney disease with stage 1 through stage 4 chronic kidney disease, or unspecified chronic kidney disease: Secondary | ICD-10-CM | POA: Diagnosis not present

## 2015-10-27 DIAGNOSIS — N189 Chronic kidney disease, unspecified: Secondary | ICD-10-CM | POA: Diagnosis not present

## 2015-10-27 DIAGNOSIS — R269 Unspecified abnormalities of gait and mobility: Secondary | ICD-10-CM | POA: Diagnosis not present

## 2015-10-27 DIAGNOSIS — J449 Chronic obstructive pulmonary disease, unspecified: Secondary | ICD-10-CM | POA: Diagnosis not present

## 2015-10-27 DIAGNOSIS — M542 Cervicalgia: Secondary | ICD-10-CM | POA: Diagnosis not present

## 2015-10-27 DIAGNOSIS — E1165 Type 2 diabetes mellitus with hyperglycemia: Secondary | ICD-10-CM | POA: Diagnosis not present

## 2015-10-31 DIAGNOSIS — R269 Unspecified abnormalities of gait and mobility: Secondary | ICD-10-CM | POA: Diagnosis not present

## 2015-10-31 DIAGNOSIS — E1165 Type 2 diabetes mellitus with hyperglycemia: Secondary | ICD-10-CM | POA: Diagnosis not present

## 2015-10-31 DIAGNOSIS — M542 Cervicalgia: Secondary | ICD-10-CM | POA: Diagnosis not present

## 2015-10-31 DIAGNOSIS — J449 Chronic obstructive pulmonary disease, unspecified: Secondary | ICD-10-CM | POA: Diagnosis not present

## 2015-10-31 DIAGNOSIS — N189 Chronic kidney disease, unspecified: Secondary | ICD-10-CM | POA: Diagnosis not present

## 2015-10-31 DIAGNOSIS — I129 Hypertensive chronic kidney disease with stage 1 through stage 4 chronic kidney disease, or unspecified chronic kidney disease: Secondary | ICD-10-CM | POA: Diagnosis not present

## 2015-11-01 ENCOUNTER — Telehealth: Payer: Self-pay | Admitting: Radiation Oncology

## 2015-11-01 ENCOUNTER — Ambulatory Visit (HOSPITAL_COMMUNITY)
Admission: RE | Admit: 2015-11-01 | Discharge: 2015-11-01 | Disposition: A | Discharge status: Home / Self Care | Payer: Medicare Other | Source: Ambulatory Visit | Attending: Radiation Oncology | Admitting: Radiation Oncology

## 2015-11-01 ENCOUNTER — Encounter (HOSPITAL_COMMUNITY): Payer: Self-pay

## 2015-11-01 DIAGNOSIS — C3412 Malignant neoplasm of upper lobe, left bronchus or lung: Secondary | ICD-10-CM | POA: Diagnosis not present

## 2015-11-01 DIAGNOSIS — C3490 Malignant neoplasm of unspecified part of unspecified bronchus or lung: Secondary | ICD-10-CM | POA: Diagnosis not present

## 2015-11-01 DIAGNOSIS — I700 Atherosclerosis of aorta: Secondary | ICD-10-CM | POA: Diagnosis not present

## 2015-11-01 DIAGNOSIS — I2510 Atherosclerotic heart disease of native coronary artery without angina pectoris: Secondary | ICD-10-CM | POA: Diagnosis not present

## 2015-11-01 DIAGNOSIS — Z902 Acquired absence of lung [part of]: Secondary | ICD-10-CM | POA: Insufficient documentation

## 2015-11-01 NOTE — Telephone Encounter (Signed)
I reviewed the patient's CT scan results. Stable findings in comparison to pretreatment imaging. He has trace right effusion which is stable since his scan in May. He will be scheduled for a CT scan in 6 months, and would like to follow up with Dr. Lisbeth Renshaw in [place] to review but have scans in [place].

## 2015-11-02 DIAGNOSIS — N189 Chronic kidney disease, unspecified: Secondary | ICD-10-CM | POA: Diagnosis not present

## 2015-11-02 DIAGNOSIS — M542 Cervicalgia: Secondary | ICD-10-CM | POA: Diagnosis not present

## 2015-11-02 DIAGNOSIS — J449 Chronic obstructive pulmonary disease, unspecified: Secondary | ICD-10-CM | POA: Diagnosis not present

## 2015-11-02 DIAGNOSIS — E1165 Type 2 diabetes mellitus with hyperglycemia: Secondary | ICD-10-CM | POA: Diagnosis not present

## 2015-11-02 DIAGNOSIS — R269 Unspecified abnormalities of gait and mobility: Secondary | ICD-10-CM | POA: Diagnosis not present

## 2015-11-02 DIAGNOSIS — I129 Hypertensive chronic kidney disease with stage 1 through stage 4 chronic kidney disease, or unspecified chronic kidney disease: Secondary | ICD-10-CM | POA: Diagnosis not present

## 2015-11-08 DIAGNOSIS — E1165 Type 2 diabetes mellitus with hyperglycemia: Secondary | ICD-10-CM | POA: Diagnosis not present

## 2015-11-08 DIAGNOSIS — N189 Chronic kidney disease, unspecified: Secondary | ICD-10-CM | POA: Diagnosis not present

## 2015-11-08 DIAGNOSIS — M542 Cervicalgia: Secondary | ICD-10-CM | POA: Diagnosis not present

## 2015-11-08 DIAGNOSIS — I129 Hypertensive chronic kidney disease with stage 1 through stage 4 chronic kidney disease, or unspecified chronic kidney disease: Secondary | ICD-10-CM | POA: Diagnosis not present

## 2015-11-08 DIAGNOSIS — J449 Chronic obstructive pulmonary disease, unspecified: Secondary | ICD-10-CM | POA: Diagnosis not present

## 2015-11-08 DIAGNOSIS — R269 Unspecified abnormalities of gait and mobility: Secondary | ICD-10-CM | POA: Diagnosis not present

## 2015-11-09 DIAGNOSIS — R269 Unspecified abnormalities of gait and mobility: Secondary | ICD-10-CM | POA: Diagnosis not present

## 2015-11-09 DIAGNOSIS — J449 Chronic obstructive pulmonary disease, unspecified: Secondary | ICD-10-CM | POA: Diagnosis not present

## 2015-11-09 DIAGNOSIS — I129 Hypertensive chronic kidney disease with stage 1 through stage 4 chronic kidney disease, or unspecified chronic kidney disease: Secondary | ICD-10-CM | POA: Diagnosis not present

## 2015-11-09 DIAGNOSIS — E1165 Type 2 diabetes mellitus with hyperglycemia: Secondary | ICD-10-CM | POA: Diagnosis not present

## 2015-11-09 DIAGNOSIS — N189 Chronic kidney disease, unspecified: Secondary | ICD-10-CM | POA: Diagnosis not present

## 2015-11-09 DIAGNOSIS — M542 Cervicalgia: Secondary | ICD-10-CM | POA: Diagnosis not present

## 2015-11-14 DIAGNOSIS — M542 Cervicalgia: Secondary | ICD-10-CM | POA: Diagnosis not present

## 2015-11-14 DIAGNOSIS — R269 Unspecified abnormalities of gait and mobility: Secondary | ICD-10-CM | POA: Diagnosis not present

## 2015-11-14 DIAGNOSIS — J449 Chronic obstructive pulmonary disease, unspecified: Secondary | ICD-10-CM | POA: Diagnosis not present

## 2015-11-14 DIAGNOSIS — N189 Chronic kidney disease, unspecified: Secondary | ICD-10-CM | POA: Diagnosis not present

## 2015-11-14 DIAGNOSIS — E1165 Type 2 diabetes mellitus with hyperglycemia: Secondary | ICD-10-CM | POA: Diagnosis not present

## 2015-11-14 DIAGNOSIS — I129 Hypertensive chronic kidney disease with stage 1 through stage 4 chronic kidney disease, or unspecified chronic kidney disease: Secondary | ICD-10-CM | POA: Diagnosis not present

## 2015-11-16 DIAGNOSIS — I129 Hypertensive chronic kidney disease with stage 1 through stage 4 chronic kidney disease, or unspecified chronic kidney disease: Secondary | ICD-10-CM | POA: Diagnosis not present

## 2015-11-16 DIAGNOSIS — M542 Cervicalgia: Secondary | ICD-10-CM | POA: Diagnosis not present

## 2015-11-16 DIAGNOSIS — E1165 Type 2 diabetes mellitus with hyperglycemia: Secondary | ICD-10-CM | POA: Diagnosis not present

## 2015-11-16 DIAGNOSIS — R269 Unspecified abnormalities of gait and mobility: Secondary | ICD-10-CM | POA: Diagnosis not present

## 2015-11-16 DIAGNOSIS — J449 Chronic obstructive pulmonary disease, unspecified: Secondary | ICD-10-CM | POA: Diagnosis not present

## 2015-11-16 DIAGNOSIS — N189 Chronic kidney disease, unspecified: Secondary | ICD-10-CM | POA: Diagnosis not present

## 2015-12-05 DIAGNOSIS — M47892 Other spondylosis, cervical region: Secondary | ICD-10-CM | POA: Diagnosis not present

## 2015-12-05 DIAGNOSIS — I10 Essential (primary) hypertension: Secondary | ICD-10-CM | POA: Diagnosis not present

## 2015-12-05 DIAGNOSIS — M25512 Pain in left shoulder: Secondary | ICD-10-CM | POA: Diagnosis not present

## 2015-12-05 DIAGNOSIS — M4722 Other spondylosis with radiculopathy, cervical region: Secondary | ICD-10-CM | POA: Diagnosis not present

## 2015-12-06 ENCOUNTER — Other Ambulatory Visit: Payer: Self-pay | Admitting: Neurosurgery

## 2015-12-06 DIAGNOSIS — M25512 Pain in left shoulder: Secondary | ICD-10-CM

## 2015-12-07 ENCOUNTER — Ambulatory Visit
Admission: RE | Admit: 2015-12-07 | Discharge: 2015-12-07 | Disposition: A | Discharge status: Home / Self Care | Payer: Self-pay | Source: Ambulatory Visit | Attending: Neurosurgery | Admitting: Neurosurgery

## 2015-12-07 DIAGNOSIS — M25512 Pain in left shoulder: Secondary | ICD-10-CM

## 2015-12-07 DIAGNOSIS — M75112 Incomplete rotator cuff tear or rupture of left shoulder, not specified as traumatic: Secondary | ICD-10-CM | POA: Diagnosis not present

## 2015-12-23 DIAGNOSIS — M25571 Pain in right ankle and joints of right foot: Secondary | ICD-10-CM | POA: Diagnosis not present

## 2015-12-23 DIAGNOSIS — I872 Venous insufficiency (chronic) (peripheral): Secondary | ICD-10-CM | POA: Diagnosis not present

## 2016-01-04 DIAGNOSIS — H8113 Benign paroxysmal vertigo, bilateral: Secondary | ICD-10-CM | POA: Diagnosis not present

## 2016-01-10 DIAGNOSIS — M25512 Pain in left shoulder: Secondary | ICD-10-CM | POA: Diagnosis not present

## 2016-01-10 DIAGNOSIS — Z6828 Body mass index (BMI) 28.0-28.9, adult: Secondary | ICD-10-CM | POA: Diagnosis not present

## 2016-01-18 ENCOUNTER — Ambulatory Visit (INDEPENDENT_AMBULATORY_CARE_PROVIDER_SITE_OTHER): Payer: Medicare Other | Admitting: Orthopaedic Surgery

## 2016-01-18 ENCOUNTER — Ambulatory Visit (INDEPENDENT_AMBULATORY_CARE_PROVIDER_SITE_OTHER): Payer: Medicare Other

## 2016-01-18 DIAGNOSIS — M19032 Primary osteoarthritis, left wrist: Secondary | ICD-10-CM

## 2016-01-18 DIAGNOSIS — M25532 Pain in left wrist: Secondary | ICD-10-CM | POA: Diagnosis not present

## 2016-01-18 DIAGNOSIS — M7542 Impingement syndrome of left shoulder: Secondary | ICD-10-CM

## 2016-01-18 MED ORDER — LIDOCAINE HCL 1 % IJ SOLN
1.0000 mL | INTRAMUSCULAR | Status: AC | PRN
  Administered 2016-01-18: 1 mL

## 2016-01-18 MED ORDER — METHYLPREDNISOLONE ACETATE 40 MG/ML IJ SUSP
40.0000 mg | INTRAMUSCULAR | Status: AC | PRN
  Administered 2016-01-18: 40 mg

## 2016-01-18 NOTE — Progress Notes (Deleted)
Office Visit Note   Patient: Jonathan [name]           Date of Birth: 1940/07/11           MRN: [id number] Visit Date: 01/18/2016              Requested by: Celedonio Savage, MD [address], [place] [address] PCP: Celedonio Savage, MD   Assessment & Plan: Visit Diagnoses:  1. Pain in left wrist     Plan: ***  Follow-Up Instructions: No Follow-up on file.   Orders:  Orders Placed This Encounter  Procedures  . XR Wrist Complete Left   No orders of the defined types were placed in this encounter.     Procedures: No procedures performed   Clinical Data: No additional findings.   Subjective: Chief Complaint  Patient presents with  . Left Shoulder - Pain, Follow-up    Patient comes in today for a follow up with Left shoulder pain. He states he is doing well with Physical therapy. Pain is radiating down into Left wrist. He is having some numbness, swelling and tingling. His pain level is a 8-9/10. He is taking Oxycodone for his pain. Overall he is doing much better than before.    Review of Systems   Objective: Vital Signs: There were no vitals taken for this visit.  Physical Exam  Right Shoulder Exam   Muscle Strength  Internal Rotation: 5/5    Left Shoulder Exam   Range of Motion  Forward Flexion: 170   Muscle Strength  Internal Rotation: 5/5  External Rotation: 5/5   Tests  Impingement: positive  Comments:  Negative empty can test bilateral.      Specialty Comments:  No specialty comments available.  Imaging: No results found.   PMFS History: Patient Active Problem List   Diagnosis Date Noted  . Primary malignant neoplasm of left upper lobe of lung ([place]) 09/21/2015  . Lung cancer ([place]) 07/04/2015  . Diabetes mellitus type II, controlled ([place]) 06/01/2015  . 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 Emphysema of lung ([place])     . Kidney stones   . Osteoporosis   . H/O total ankle replacement 04/20/2015  . Carpal tunnel syndrome - right 07/09/2014  . Abdominal bloating 06/14/2014  . History of colonic polyps 06/14/2014  . Polyneuropathy in other diseases classified elsewhere ([place]) 12/05/2012  . Lesion of ulnar nerve 12/05/2012  . Syncope 11/15/2012  . Gout 11/15/2012  . Constipation 04/02/2009  . CYSTITIS, RECURRENT 12/02/2008  . COPD 10/26/2008  . DIVERTICULUM, BLADDER 10/11/2008  . INSOMNIA 07/24/2008  . ACUTE CYSTITIS 07/20/2008  . FATIGUE 07/20/2008  . DEPRESSION 02/27/2008  . CHEST PAIN UNSPECIFIED 02/27/2008  . NICOTINE ADDICTION 08/12/2007  . DYSLIPIDEMIA 07/22/2007  . OBESITY 07/22/2007  . ERECTILE DYSFUNCTION 07/22/2007  . HYPERTENSION 07/22/2007   Past Medical History:  Diagnosis Date  . Abdominal aortic aneurysm ([place])    pt denies-  . Anxiety   . Arthritis    ankle, hands , herniated disc L3-4  . CKD (chronic kidney disease)    pt denies  . COPD (chronic obstructive pulmonary disease) ([place])    normally have breathing tx prior to surgery  . Depression   . Diabetes mellitus without complication ([place])    borderline no med, controlled with diet  . Dyslipidemia   . Elevated WBCs September  2011   and UTI Hospitalized   . Emphysema of lung ([place])   . Erectile dysfunction   . Fatty liver   . Gait disorder   . Gastritis   . GERD (gastroesophageal reflux disease)   . Hiatal hernia   . History of colon polyps   . History of kidney stones   . History of kidney stones   . Hypertension   . Lesion of ulnar nerve 12/05/2012   Right side  . Lumbar spinal stenosis   . Lung cancer ([place])   . Neuropathy ([place])   . Nicotine addiction   . Obesity   . Osteoarthritis   . Osteoporosis   . Pneumonia    hx  . Polyneuropathy in other diseases classified elsewhere ([place]) 12/05/2012  . Shortness of breath dyspnea   . Vertigo   . Vitamin D deficiency   . Wears dentures    bottom    Family  History  Problem Relation Age of Onset  . Cancer Father     Asbestos  . Colon cancer Neg Hx   . Colon polyps Neg Hx   . Kidney disease Neg Hx   . Esophageal cancer Neg Hx   . Gallbladder disease Neg Hx   . Heart disease Neg Hx   . Diabetes Neg Hx     Past Surgical History:  Procedure Laterality Date  . ANKLE ARTHROPLASTY Right 04/20/2015  . BLADDER SURGERY  02/2011   For Diverticulum of bladder removed  . CARPAL TUNNEL RELEASE Right 07/09/2014   Procedure: RIGHT OPEN CARPAL TUNNEL RELEASE;  Surgeon: Mcarthur Rossetti, MD;  [facility];  Service: Orthopedics;  Laterality: Right;  . CHOLECYSTECTOMY N/A 02/24/2014   Procedure: LAPAROSCOPIC CHOLECYSTECTOMY;  Surgeon: Coralie Keens, MD;  Location: [facility];  Service: General;  Laterality: N/A;  . colon polyp resection    . COLONOSCOPY    . CYSTO    . TOTAL ANKLE ARTHROPLASTY Right 04/20/2015   Procedure: TOTAL ANKLE ARTHOPLASTY;  Surgeon: Newt Minion, MD;  Location: [facility];  Service: Orthopedics;  Laterality: Right;  [name] VIDEO ASSISTED THORACOSCOPY (VATS)/ LOBECTOMY Right 07/04/2015   Procedure: VIDEO ASSISTED THORACOSCOPY (VATS)/ RIGHT UPPER LOBECTOMY;  Surgeon: Melrose Nakayama, MD;  Location: [facility];  Service: Thoracic;  Laterality: Right;  [name] VIDEO BRONCHOSCOPY N/A 06/17/2015   Procedure: VIDEO BRONCHOSCOPY;  Surgeon: Melrose Nakayama, MD;  Location: [facility];  Service: Thoracic;  Laterality: N/A;  . VIDEO BRONCHOSCOPY WITH ENDOBRONCHIAL NAVIGATION N/A 06/17/2015   Procedure: VIDEO BRONCHOSCOPY WITH ENDOBRONCHIAL NAVIGATION;  Surgeon: Melrose Nakayama, MD;  Location: [facility];  Service: Thoracic;  Laterality: N/A;  . VIDEO BRONCHOSCOPY WITH ENDOBRONCHIAL ULTRASOUND N/A 06/17/2015   Procedure: VIDEO BRONCHOSCOPY WITH ENDOBRONCHIAL ULTRASOUND;  Surgeon: Melrose Nakayama, MD;  Location: [facility];  Service: Thoracic;  Laterality: N/A;   Social History   Occupational History  . retired    Social History  Main Topics  . Smoking status: Former Smoker    Packs/day: 0.50    Years: 35.00    Types: Cigarettes    Quit date: 02/13/2015  . Smokeless tobacco: Never Used  . Alcohol use No  . Drug use: No  . Sexual activity: Not on file

## 2016-01-18 NOTE — Progress Notes (Addendum)
Office Visit Note   Patient: Jonathan [name]           Date of Birth: 1940/06/20           MRN: [id number] Visit Date: 01/18/2016              Requested by: Celedonio Savage, MD [address], [place] [address] PCP: Celedonio Savage, MD   Assessment & Plan: Visit Diagnoses:  1. Pain in left wrist   2. Impingement syndrome of left shoulder   3. Arthritis of left wrist     Plan: POSSIBLE RADIAL CARPAL INJECTION AT NEXT VISIT. Continue PT for left shoulder   Follow-Up Instructions: Return in about 4 weeks (around 02/15/2016).   Orders:  Orders Placed This Encounter  Procedures  . Hand/Upper Extremity Injection/Arthrocentesis  . XR Wrist Complete Left   No orders of the defined types were placed in this encounter.     Procedures: Hand/UE Inj Date/Time: 01/18/2016 9:53 AM Performed by: Pete Pelt Authorized by: Pete Pelt   Consent Given by:  Patient Site marked: the procedure site was marked   Timeout: prior to procedure the correct patient, procedure, and site was verified   Indications:  Therapeutic Condition: de Quervain's   Site:  L extensor compartment 1 Needle Size:  25 G Approach:  Dorsal Medications:  1 mL lidocaine 1 %; 40 mg methylPREDNISolone acetate 40 MG/ML Patient tolerance:  Patient tolerated the procedure well with no immediate complications       Clinical Data: No additional findings.   Subjective: Chief Complaint  Patient presents with  . Left Shoulder - Pain, Follow-up    Follow up left shoulder states improved range of motion and pain. Complaining now of left wrist pain and deacreased strength. Feels wrist pain secondary to PT on sholder.    Review of Systems   Objective: Vital Signs: There were no vitals taken for this visit.  Physical Exam  Right Hand Exam   Range of Motion   Wrist  Extension: normal  Flexion: normal  Pronation: normal  Supination: normal   Tests  Finkelstein: negative   Left  Hand Exam   Tenderness  Left hand tenderness location: tenderness over first dorsal compartment.   Range of Motion   Wrist  Extension: abnormal  Flexion: abnormal  Pronation: normal  Supination: normal   Tests  Finkelstein: positive   Right Shoulder Exam   Muscle Strength  Internal Rotation: 5/5  External Rotation: 5/5   Other  Pulse: present   Left Shoulder Exam  Left shoulder exam is normal.  Muscle Strength  Internal Rotation: 5/5  External Rotation: 5/5   Other  Pulse: present   Comments:  Empty can Bilateral shoulders      Specialty Comments:  No specialty comments available.  Imaging: Xr Wrist Complete Left  Result Date: 01/18/2016 Right wrist Severe CMC joint arthritis, radial carpal joint and moderate MCP joint arthritis right thumb.  No acute fracture.    PMFS History: Patient Active Problem List   Diagnosis Date Noted  . Primary malignant neoplasm of left upper lobe of lung ([place]) 09/21/2015  . Lung cancer ([place]) 07/04/2015  . Diabetes mellitus type II, controlled ([place]) 06/01/2015  . 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 Emphysema of lung ([place])   . Kidney stones   . Osteoporosis   . H/O  total ankle replacement 04/20/2015  . Carpal tunnel syndrome - right 07/09/2014  . Abdominal bloating 06/14/2014  . History of colonic polyps 06/14/2014  . Polyneuropathy in other diseases classified elsewhere ([place]) 12/05/2012  . Lesion of ulnar nerve 12/05/2012  . Syncope 11/15/2012  . Gout 11/15/2012  . Constipation 04/02/2009  . CYSTITIS, RECURRENT 12/02/2008  . COPD 10/26/2008  . DIVERTICULUM, BLADDER 10/11/2008  . INSOMNIA 07/24/2008  . ACUTE CYSTITIS 07/20/2008  . FATIGUE 07/20/2008  . DEPRESSION 02/27/2008  . CHEST PAIN UNSPECIFIED 02/27/2008  . NICOTINE ADDICTION 08/12/2007  . DYSLIPIDEMIA 07/22/2007  . OBESITY 07/22/2007  .  ERECTILE DYSFUNCTION 07/22/2007  . HYPERTENSION 07/22/2007   Past Medical History:  Diagnosis Date  . Abdominal aortic aneurysm ([place])    pt denies-  . Anxiety   . Arthritis    ankle, hands , herniated disc L3-4  . CKD (chronic kidney disease)    pt denies  . COPD (chronic obstructive pulmonary disease) ([place])    normally have breathing tx prior to surgery  . Depression   . Diabetes mellitus without complication ([place])    borderline no med, controlled with diet  . Dyslipidemia   . Elevated WBCs September 2011   and UTI Hospitalized   . Emphysema of lung ([place])   . Erectile dysfunction   . Fatty liver   . Gait disorder   . Gastritis   . GERD (gastroesophageal reflux disease)   . Hiatal hernia   . History of colon polyps   . History of kidney stones   . History of kidney stones   . Hypertension   . Lesion of ulnar nerve 12/05/2012   Right side  . Lumbar spinal stenosis   . Lung cancer ([place])   . Neuropathy ([place])   . Nicotine addiction   . Obesity   . Osteoarthritis   . Osteoporosis   . Pneumonia    hx  . Polyneuropathy in other diseases classified elsewhere ([place]) 12/05/2012  . Shortness of breath dyspnea   . Vertigo   . Vitamin D deficiency   . Wears dentures    bottom    Family History  Problem Relation Age of Onset  . Cancer Father     Asbestos  . Colon cancer Neg Hx   . Colon polyps Neg Hx   . Kidney disease Neg Hx   . Esophageal cancer Neg Hx   . Gallbladder disease Neg Hx   . Heart disease Neg Hx   . Diabetes Neg Hx     Past Surgical History:  Procedure Laterality Date  . ANKLE ARTHROPLASTY Right 04/20/2015  . BLADDER SURGERY  02/2011   For Diverticulum of bladder removed  . CARPAL TUNNEL RELEASE Right 07/09/2014   Procedure: RIGHT OPEN CARPAL TUNNEL RELEASE;  Surgeon: Mcarthur Rossetti, MD;  [facility];  Service: Orthopedics;  Laterality: Right;  . CHOLECYSTECTOMY N/A 02/24/2014   Procedure: LAPAROSCOPIC CHOLECYSTECTOMY;  Surgeon: Coralie Keens, MD;  Location: [facility];  Service: General;  Laterality: N/A;  . colon polyp resection    . COLONOSCOPY    . CYSTO    . TOTAL ANKLE ARTHROPLASTY Right 04/20/2015   Procedure: TOTAL ANKLE ARTHOPLASTY;  Surgeon: Newt Minion, MD;  Location: [facility];  Service: Orthopedics;  Laterality: Right;  [name] VIDEO ASSISTED THORACOSCOPY (VATS)/ LOBECTOMY Right 07/04/2015   Procedure: VIDEO ASSISTED THORACOSCOPY (VATS)/ RIGHT UPPER LOBECTOMY;  Surgeon: Melrose Nakayama, MD;  Location: [facility];  Service: Thoracic;  Laterality: Right;  .  VIDEO BRONCHOSCOPY N/A 06/17/2015   Procedure: VIDEO BRONCHOSCOPY;  Surgeon: Melrose Nakayama, MD;  Location: [facility];  Service: Thoracic;  Laterality: N/A;  . VIDEO BRONCHOSCOPY WITH ENDOBRONCHIAL NAVIGATION N/A 06/17/2015   Procedure: VIDEO BRONCHOSCOPY WITH ENDOBRONCHIAL NAVIGATION;  Surgeon: Melrose Nakayama, MD;  Location: [facility];  Service: Thoracic;  Laterality: N/A;  . VIDEO BRONCHOSCOPY WITH ENDOBRONCHIAL ULTRASOUND N/A 06/17/2015   Procedure: VIDEO BRONCHOSCOPY WITH ENDOBRONCHIAL ULTRASOUND;  Surgeon: Melrose Nakayama, MD;  Location: [facility];  Service: Thoracic;  Laterality: N/A;   Social History   Occupational History  . retired    Social History Main Topics  . Smoking status: Former Smoker    Packs/day: 0.50    Years: 35.00    Types: Cigarettes    Quit date: 02/13/2015  . Smokeless tobacco: Never Used  . Alcohol use No  . Drug use: No  . Sexual activity: Not on file

## 2016-01-18 NOTE — Progress Notes (Deleted)
Office Visit Note   Patient: Jonathan [name]           Date of Birth: 07-Nov-1940           MRN: [id number] Visit Date: 01/18/2016              Requested by: Celedonio Savage, MD [address] [place], [place] [address] PCP: Celedonio Savage, MD   Assessment & Plan: Visit Diagnoses: No diagnosis found.  Plan: ***  Follow-Up Instructions: No Follow-up on file.   Orders:  No orders of the defined types were placed in this encounter.  No orders of the defined types were placed in this encounter.     Procedures: No procedures performed   Clinical Data: No additional findings.   Subjective: Chief Complaint  Patient presents with  . Left Shoulder - Pain, Follow-up    HPI  Review of Systems   Objective: Vital Signs: There were no vitals taken for this visit.  Physical Exam  Ortho Exam  Specialty Comments:  No specialty comments available.  Imaging: No results found.   PMFS History: Patient Active Problem List   Diagnosis Date Noted  . Primary malignant neoplasm of left upper lobe of lung ([place]) 09/21/2015  . Lung cancer ([place]) 07/04/2015  . Diabetes mellitus type II, controlled ([place]) 06/01/2015  . 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 Emphysema of lung ([place])   . Kidney stones   . Osteoporosis   . H/O total ankle replacement 04/20/2015  . Carpal tunnel syndrome - right 07/09/2014  . Abdominal bloating 06/14/2014  . History of colonic polyps 06/14/2014  . Polyneuropathy in other diseases classified elsewhere ([place]) 12/05/2012  . Lesion of ulnar nerve 12/05/2012  . Syncope 11/15/2012  . Gout 11/15/2012  . Constipation 04/02/2009  . CYSTITIS, RECURRENT 12/02/2008  . COPD 10/26/2008  . DIVERTICULUM, BLADDER 10/11/2008  . INSOMNIA 07/24/2008  . ACUTE CYSTITIS 07/20/2008  . FATIGUE 07/20/2008  . DEPRESSION 02/27/2008  . CHEST PAIN UNSPECIFIED 02/27/2008   . NICOTINE ADDICTION 08/12/2007  . DYSLIPIDEMIA 07/22/2007  . OBESITY 07/22/2007  . ERECTILE DYSFUNCTION 07/22/2007  . HYPERTENSION 07/22/2007   Past Medical History:  Diagnosis Date  . Abdominal aortic aneurysm ([place])    pt denies-  . Anxiety   . Arthritis    ankle, hands , herniated disc L3-4  . CKD (chronic kidney disease)    pt denies  . COPD (chronic obstructive pulmonary disease) ([place])    normally have breathing tx prior to surgery  . Depression   . Diabetes mellitus without complication ([place])    borderline no med, controlled with diet  . Dyslipidemia   . Elevated WBCs September 2011   and UTI Hospitalized   . Emphysema of lung ([place])   . Erectile dysfunction   . Fatty liver   . Gait disorder   . Gastritis   . GERD (gastroesophageal reflux disease)   . Hiatal hernia   . History of colon polyps   . History of kidney stones   . History of kidney stones   . Hypertension   . Lesion of ulnar nerve 12/05/2012   Right side  . Lumbar spinal stenosis   . Lung cancer ([place])   . Neuropathy ([place])   . Nicotine addiction   . Obesity   . Osteoarthritis   . Osteoporosis   . Pneumonia  hx  . Polyneuropathy in other diseases classified elsewhere ([place]) 12/05/2012  . Shortness of breath dyspnea   . Vertigo   . Vitamin D deficiency   . Wears dentures    bottom    Family History  Problem Relation Age of Onset  . Cancer Father     Asbestos  . Colon cancer Neg Hx   . Colon polyps Neg Hx   . Kidney disease Neg Hx   . Esophageal cancer Neg Hx   . Gallbladder disease Neg Hx   . Heart disease Neg Hx   . Diabetes Neg Hx     Past Surgical History:  Procedure Laterality Date  . ANKLE ARTHROPLASTY Right 04/20/2015  . BLADDER SURGERY  02/2011   For Diverticulum of bladder removed  . CARPAL TUNNEL RELEASE Right 07/09/2014   Procedure: RIGHT OPEN CARPAL TUNNEL RELEASE;  Surgeon: Mcarthur Rossetti, MD;  [facility];  Service: Orthopedics;  Laterality: Right;  .  CHOLECYSTECTOMY N/A 02/24/2014   Procedure: LAPAROSCOPIC CHOLECYSTECTOMY;  Surgeon: Coralie Keens, MD;  Location: [facility];  Service: General;  Laterality: N/A;  . colon polyp resection    . COLONOSCOPY    . CYSTO    . TOTAL ANKLE ARTHROPLASTY Right 04/20/2015   Procedure: TOTAL ANKLE ARTHOPLASTY;  Surgeon: Newt Minion, MD;  Location: [facility];  Service: Orthopedics;  Laterality: Right;  [name] VIDEO ASSISTED THORACOSCOPY (VATS)/ LOBECTOMY Right 07/04/2015   Procedure: VIDEO ASSISTED THORACOSCOPY (VATS)/ RIGHT UPPER LOBECTOMY;  Surgeon: Melrose Nakayama, MD;  Location: [facility];  Service: Thoracic;  Laterality: Right;  [name] VIDEO BRONCHOSCOPY N/A 06/17/2015   Procedure: VIDEO BRONCHOSCOPY;  Surgeon: Melrose Nakayama, MD;  Location: [facility];  Service: Thoracic;  Laterality: N/A;  . VIDEO BRONCHOSCOPY WITH ENDOBRONCHIAL NAVIGATION N/A 06/17/2015   Procedure: VIDEO BRONCHOSCOPY WITH ENDOBRONCHIAL NAVIGATION;  Surgeon: Melrose Nakayama, MD;  Location: [facility];  Service: Thoracic;  Laterality: N/A;  . VIDEO BRONCHOSCOPY WITH ENDOBRONCHIAL ULTRASOUND N/A 06/17/2015   Procedure: VIDEO BRONCHOSCOPY WITH ENDOBRONCHIAL ULTRASOUND;  Surgeon: Melrose Nakayama, MD;  Location: [facility];  Service: Thoracic;  Laterality: N/A;   Social History   Occupational History  . retired    Social History Main Topics  . Smoking status: Former Smoker    Packs/day: 0.50    Years: 35.00    Types: Cigarettes    Quit date: 02/13/2015  . Smokeless tobacco: Never Used  . Alcohol use No  . Drug use: No  . Sexual activity: Not on file

## 2016-01-18 NOTE — Progress Notes (Deleted)
            Procedure Note  Patient: Jonathan [name]             Date of Birth: Jan 02, 1941           MRN: [id number]             Visit Date: 01/18/2016  Procedures: Visit Diagnoses: Pain in left wrist - Plan: XR Wrist Complete Left, Hand/Upper Extremity Injection/Arthrocentesis  Impingement syndrome of left shoulder  Arthritis of left wrist  Hand/UE Inj Date/Time: 01/18/2016 9:26 AM Performed by: Pete Pelt Authorized by: Pete Pelt   Consent Given by:  Patient Indications:  Pain Condition: de Quervain's   Site:  L extensor compartment 1 Needle Size:  25 G Approach:  Dorsal Ultrasound Guidance: No   Medications:  1 mL lidocaine 1 %; 40 mg methylPREDNISolone acetate 40 MG/ML Patient tolerance:  Patient tolerated the procedure well with no immediate complications

## 2016-02-02 ENCOUNTER — Telehealth (INDEPENDENT_AMBULATORY_CARE_PROVIDER_SITE_OTHER): Payer: Self-pay | Admitting: Orthopedic Surgery

## 2016-02-02 NOTE — Telephone Encounter (Signed)
Patient called asked if Dr. Sharol Given will write a statement to the [facility] stating he need the brace for his right ankle. Patient asked for a return call.  The number to contact him is [phone]

## 2016-02-05 NOTE — Telephone Encounter (Signed)
write note for patient stated he needs brace for his right ankle for the [place]

## 2016-02-15 ENCOUNTER — Encounter (INDEPENDENT_AMBULATORY_CARE_PROVIDER_SITE_OTHER): Payer: Self-pay | Admitting: Orthopaedic Surgery

## 2016-02-15 ENCOUNTER — Ambulatory Visit (INDEPENDENT_AMBULATORY_CARE_PROVIDER_SITE_OTHER): Payer: Medicare Other | Admitting: Physician Assistant

## 2016-02-15 VITALS — Ht 77.25 in | Wt 251.0 lb

## 2016-02-15 DIAGNOSIS — M25532 Pain in left wrist: Secondary | ICD-10-CM | POA: Diagnosis not present

## 2016-02-15 DIAGNOSIS — M7542 Impingement syndrome of left shoulder: Secondary | ICD-10-CM | POA: Diagnosis not present

## 2016-02-15 MED ORDER — LIDOCAINE HCL 1 % IJ SOLN
1.0000 mL | INTRAMUSCULAR | Status: AC | PRN
  Administered 2016-02-15: 1 mL

## 2016-02-15 MED ORDER — CYCLOBENZAPRINE HCL 10 MG PO TABS: 10.0000 mg | ORAL_TABLET | Freq: Three times a day (TID) | ORAL | 0 refills | Status: DC | PRN

## 2016-02-15 MED ORDER — METHYLPREDNISOLONE ACETATE 40 MG/ML IJ SUSP
40.0000 mg | INTRAMUSCULAR | Status: AC | PRN
  Administered 2016-02-15: 40 mg via INTRA_ARTICULAR

## 2016-02-15 NOTE — Progress Notes (Signed)
Office Visit Note   Patient: Jonathan [name]           Date of Birth: Aug 01, 1940           MRN: [id number] Visit Date: 02/15/2016              Requested by: Jonathan Savage, MD Jonathan [name], [place] [address] PCP: Jonathan Savage, MD   Assessment & Plan: Visit Diagnoses:  1. Impingement syndrome of left shoulder   2. Pain in left wrist     Plan: Discharge to home exercise program for left shoulder.   Follow-Up Instructions: Return if symptoms worsen or fail to improve.   Orders:  Orders Placed This Encounter  Procedures  . Medium Joint Injection/Arthrocentesis   Meds ordered this encounter  Medications  . cyclobenzaprine (FLEXERIL) 10 MG tablet    Sig: Take 1 tablet (10 mg total) by mouth 3 (three) times daily as needed for muscle spasms.    Dispense:  30 tablet    Refill:  0      Procedures: Medium Joint Inj Date/Time: 02/15/2016 8:48 AM Performed by: Jonathan [name] Authorized by: Jonathan [name]   Location:  Wrist Site:  L radiocarpal Needle Size:  22 G Ultrasound Guided: No   Fluoroscopic Guidance: No   Medications:  1 mL lidocaine 1 %; 40 mg methylPREDNISolone acetate 40 MG/ML Aspiration Attempted: No   Patient tolerance:  Patient tolerated the procedure well with no immediate complications     Clinical Data: No additional findings.   Subjective: Chief Complaint  Patient presents with  . Left Shoulder - Follow-up    Follow up left shoulder impingement. He has completed physical therapy and is feeling much better. He would like a note to be discharged from physical therapy today.  . Left Wrist - Follow-up    Follow up left wrist, s/p dequervains injection left wrist 01/18/16. States the soreness has returned. Requesting a prescription for flexeril today.    HPI  Jonathan [name] returns today stating that his left shoulder is much better since undergoing physical therapy. Having no pain with range of motion shoulder and has excellent range of  motion per patient.In  regards to his left wrist states the injection for de Quervain's helped for a while but now the injection is wearing off.  Review of Systems See HPI  Objective: Vital Signs: Ht 6' 5.25" (1.962 m)   Wt 251 lb (113.9 kg)   BMI 29.57 kg/m   Physical Exam  Constitutional: He is oriented to person, place, and time. He appears well-developed and well-nourished. No distress.  Neurological: He is alert and oriented to person, place, and time.  Psychiatric: He has a normal mood and affect.    Ortho Exam Left Shoulder negative for impingement. 5 out of 5 strengths with external/internal rotation against resistance bilateral shoulders. Empty can test negative bilaterally. He has fluid range of motion left shoulder without pain. Left wrist has tenderness over the first extensor compartment. Positive Finkelstein's. Tenderness over the radial carpal region. No rashes skin lesions ulcerations erythema about the wrist hand. Radial Pulse intact. Specialty Comments:  No specialty comments available.  Imaging: No results found.   PMFS History: Patient Active Problem List   Diagnosis Date Noted  . Primary malignant neoplasm of left upper lobe of lung (Jonathan [name]) 09/21/2015  . Lung cancer (Jonathan [name]) 07/04/2015  . Diabetes mellitus type II, controlled (Jonathan [name]) 06/01/2015  . Neoplasm of uncertain behavior of right upper lobe  of lung 05/31/2015  . 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 Emphysema of lung (Jonathan [name])   . Kidney stones   . Osteoporosis   . H/O total ankle replacement 04/20/2015  . Carpal tunnel syndrome - right 07/09/2014  . Abdominal bloating 06/14/2014  . History of colonic polyps 06/14/2014  . Polyneuropathy in other diseases classified elsewhere (Jonathan [name]) 12/05/2012  . Lesion of ulnar nerve 12/05/2012  . Syncope 11/15/2012  . Gout 11/15/2012  . Constipation 04/02/2009  . CYSTITIS, RECURRENT 12/02/2008  . COPD 10/26/2008  . DIVERTICULUM,  BLADDER 10/11/2008  . INSOMNIA 07/24/2008  . ACUTE CYSTITIS 07/20/2008  . FATIGUE 07/20/2008  . DEPRESSION 02/27/2008  . CHEST PAIN UNSPECIFIED 02/27/2008  . NICOTINE ADDICTION 08/12/2007  . DYSLIPIDEMIA 07/22/2007  . OBESITY 07/22/2007  . ERECTILE DYSFUNCTION 07/22/2007  . HYPERTENSION 07/22/2007   Past Medical History:  Diagnosis Date  . Abdominal aortic aneurysm (Jonathan [name])    pt denies-  . Anxiety   . Arthritis    ankle, hands , herniated disc L3-4  . CKD (chronic kidney disease)    pt denies  . COPD (chronic obstructive pulmonary disease) (Jonathan [name])    normally have breathing tx prior to surgery  . Depression   . Diabetes mellitus without complication (Jonathan [name])    borderline no med, controlled with diet  . Dyslipidemia   . Elevated WBCs September 2011   and UTI Hospitalized   . Emphysema of lung (Jonathan [name])   . Erectile dysfunction   . Fatty liver   . Gait disorder   . Gastritis   . GERD (gastroesophageal reflux disease)   . Hiatal hernia   . History of colon polyps   . History of kidney stones   . History of kidney stones   . Hypertension   . Lesion of ulnar nerve 12/05/2012   Right side  . Lumbar spinal stenosis   . Lung cancer ([facility])   . Neuropathy (Jonathan [name])   . Nicotine addiction   . Obesity   . Osteoarthritis   . Osteoporosis   . Pneumonia    hx  . Polyneuropathy in other diseases classified elsewhere (Jonathan [name]) 12/05/2012  . Shortness of breath dyspnea   . Vertigo   . Vitamin D deficiency   . Wears dentures    bottom    Family History  Problem Relation Age of Onset  . Cancer Father     Asbestos  . Colon cancer Neg Hx   . Colon polyps Neg Hx   . Kidney disease Neg Hx   . Esophageal cancer Neg Hx   . Gallbladder disease Neg Hx   . Heart disease Neg Hx   . Diabetes Neg Hx     Past Surgical History:  Procedure Laterality Date  . ANKLE ARTHROPLASTY Right 04/20/2015  . BLADDER SURGERY  02/2011   For Diverticulum of bladder removed  . CARPAL TUNNEL RELEASE Right  07/09/2014   Procedure: RIGHT OPEN CARPAL TUNNEL RELEASE;  Surgeon: Mcarthur Rossetti, MD;  [facility];  Service: Orthopedics;  Laterality: Right;  . CHOLECYSTECTOMY N/A 02/24/2014   Procedure: LAPAROSCOPIC CHOLECYSTECTOMY;  Surgeon: Coralie Keens, MD;  Location: [facility];  Service: General;  Laterality: N/A;  . colon polyp resection    . COLONOSCOPY    . CYSTO    . TOTAL ANKLE ARTHROPLASTY Right 04/20/2015   Procedure: TOTAL ANKLE ARTHOPLASTY;  Surgeon: Newt Minion, MD;  Location: [facility];  Service: Orthopedics;  Laterality: Right;  .  VIDEO ASSISTED THORACOSCOPY (VATS)/ LOBECTOMY Right 07/04/2015   Procedure: VIDEO ASSISTED THORACOSCOPY (VATS)/ RIGHT UPPER LOBECTOMY;  Surgeon: Melrose Nakayama, MD;  Location: [facility];  Service: Thoracic;  Laterality: Right;  [name] VIDEO BRONCHOSCOPY N/A 06/17/2015   Procedure: VIDEO BRONCHOSCOPY;  Surgeon: Melrose Nakayama, MD;  Location: [facility];  Service: Thoracic;  Laterality: N/A;  . VIDEO BRONCHOSCOPY WITH ENDOBRONCHIAL NAVIGATION N/A 06/17/2015   Procedure: VIDEO BRONCHOSCOPY WITH ENDOBRONCHIAL NAVIGATION;  Surgeon: Melrose Nakayama, MD;  Location: [facility];  Service: Thoracic;  Laterality: N/A;  . VIDEO BRONCHOSCOPY WITH ENDOBRONCHIAL ULTRASOUND N/A 06/17/2015   Procedure: VIDEO BRONCHOSCOPY WITH ENDOBRONCHIAL ULTRASOUND;  Surgeon: Melrose Nakayama, MD;  Location: [facility];  Service: Thoracic;  Laterality: N/A;   Social History   Occupational History  . retired    Social History Main Topics  . Smoking status: Former Smoker    Packs/day: 0.50    Years: 35.00    Types: Cigarettes    Quit date: 02/13/2015  . Smokeless tobacco: Never Used  . Alcohol use No  . Drug use: No  . Sexual activity: Not on file

## 2016-02-20 DIAGNOSIS — G629 Polyneuropathy, unspecified: Secondary | ICD-10-CM | POA: Diagnosis not present

## 2016-02-20 DIAGNOSIS — M5030 Other cervical disc degeneration, unspecified cervical region: Secondary | ICD-10-CM | POA: Diagnosis not present

## 2016-02-22 ENCOUNTER — Telehealth (INDEPENDENT_AMBULATORY_CARE_PROVIDER_SITE_OTHER): Payer: Self-pay | Admitting: Orthopedic Surgery

## 2016-02-22 NOTE — Telephone Encounter (Signed)
Pt bought a copy of adapted equipment that Dr. Sharol Given filled out he needs a copy faxed back  To him at [phone]. Please call him with any questions. Needs asap.

## 2016-02-24 NOTE — Telephone Encounter (Signed)
Faxed [phone]

## 2016-02-24 NOTE — Telephone Encounter (Signed)
I called to advise patient letter faxed to number provided.

## 2016-02-29 ENCOUNTER — Encounter (INDEPENDENT_AMBULATORY_CARE_PROVIDER_SITE_OTHER): Payer: Self-pay | Admitting: Orthopedic Surgery

## 2016-03-05 ENCOUNTER — Ambulatory Visit (INDEPENDENT_AMBULATORY_CARE_PROVIDER_SITE_OTHER): Payer: Medicare Other | Admitting: Orthopedic Surgery

## 2016-03-08 ENCOUNTER — Encounter (INDEPENDENT_AMBULATORY_CARE_PROVIDER_SITE_OTHER): Payer: Self-pay | Admitting: Orthopedic Surgery

## 2016-03-08 ENCOUNTER — Ambulatory Visit (INDEPENDENT_AMBULATORY_CARE_PROVIDER_SITE_OTHER): Payer: Medicare Other | Admitting: Orthopedic Surgery

## 2016-03-08 DIAGNOSIS — M24671 Ankylosis, right ankle: Secondary | ICD-10-CM

## 2016-03-08 DIAGNOSIS — Z96661 Presence of right artificial ankle joint: Secondary | ICD-10-CM | POA: Diagnosis not present

## 2016-03-08 NOTE — Progress Notes (Signed)
Office Visit Note   Patient: Jonathan [name]           Date of Birth: [birth year]           MRN: [id number] Visit Date: 03/08/2016              Requested by: Celedonio Savage, MD [address], [place] [address] PCP: Celedonio Savage, MD   Assessment & Plan: Visit Diagnoses:  1. History of total replacement of right ankle   2. Fibrosis of subtalar joint, right     Plan: Discussed the patient could use his braces when he does not have to drive to help provide better support for the subtalar joint. I recommended the patient obtain assistive device, hand controls for his car, a prescription was provided for hand controls. While patient does have some nerve damage in the right hand I feel it is safer for patient to try to use hand controls and his current inability to use his foot for driving.  Follow-Up Instructions: Return if symptoms worsen or fail to improve.   Orders:  No orders of the defined types were placed in this encounter.  No orders of the defined types were placed in this encounter.     Procedures: No procedures performed   Clinical Data: No additional findings.   Subjective: Chief Complaint  Patient presents with  . Right Ankle - Follow-up    Right total ankle arthroplasty 04/20/15.    Patient is here for right total ankle arthroplasty 04/20/15. He is doing well overall. He is requesting [facility] of [facility] for [facility], a printed copy was given to patient while in the office.   Patient is having pain in the subtalar joint on the right is status post a right total ankle arthroplasty. He states the position he has to put his foot into Drive causes supination of the subtalar joint and this is painful and he feels like that is not safe for him to drive patient has tried wearing a brace has tried wearing an ankle stabilizing orthosis he states that these are too heavy and again he feels it is not safe for him to drive with these 2 types of supportive devices. Review of  Systems Patient states he still has some shortness of breath from his lung surgery.  Objective: Vital Signs: There were no vitals taken for this visit.  Physical Exam On examination patient is alert oriented no adenopathy well-dressed normal affect normal respiratory effort he does have an antalgic gait he has got good pulses he has no pain with range of motion the ankle he has excellent range of motion the ankle he does have tenderness to palpation in the sinus Tarsi with pain with supination of the subtalar joint there is no crepitation and no instability with range of motion of the right ankle. Ortho Exam  Specialty Comments:  No specialty comments available.  Imaging: No results found.   PMFS History: Patient Active Problem List   Diagnosis Date Noted  . Fibrosis of subtalar joint, right 03/08/2016  . Primary malignant neoplasm of left upper lobe of lung ([facility] [place]) 09/21/2015  . Lung cancer ([facility]) 07/04/2015  . Diabetes mellitus type II, controlled ([place]) 06/01/2015  . 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 Emphysema of lung ([facility])   . Kidney stones   . Osteoporosis   . H/O total ankle replacement  04/20/2015  . Carpal tunnel syndrome - right 07/09/2014  . Abdominal bloating 06/14/2014  . History of colonic polyps 06/14/2014  . Polyneuropathy in other diseases classified elsewhere ([facility]) 12/05/2012  . Lesion of ulnar nerve 12/05/2012  . Syncope 11/15/2012  . Gout 11/15/2012  . Constipation 04/02/2009  . CYSTITIS, RECURRENT 12/02/2008  . COPD 10/26/2008  . DIVERTICULUM, BLADDER 10/11/2008  . INSOMNIA 07/24/2008  . ACUTE CYSTITIS 07/20/2008  . FATIGUE 07/20/2008  . DEPRESSION 02/27/2008  . CHEST PAIN UNSPECIFIED 02/27/2008  . NICOTINE ADDICTION 08/12/2007  . DYSLIPIDEMIA 07/22/2007  . OBESITY 07/22/2007  . ERECTILE DYSFUNCTION 07/22/2007  . HYPERTENSION 07/22/2007    Past Medical History:  Diagnosis Date  . Abdominal aortic aneurysm ([facility])    pt denies-  . Anxiety   . Arthritis    ankle, hands , herniated disc L3-4  . CKD (chronic kidney disease)    pt denies  . COPD (chronic obstructive pulmonary disease) ([facility])    normally have breathing tx prior to surgery  . Depression   . Diabetes mellitus without complication ([facility])    borderline no med, controlled with diet  . Dyslipidemia   . Elevated WBCs September 2011   and UTI Hospitalized   . Emphysema of lung ([facility])   . Erectile dysfunction   . Fatty liver   . Gait disorder   . Gastritis   . GERD (gastroesophageal reflux disease)   . Hiatal hernia   . History of colon polyps   . History of kidney stones   . History of kidney stones   . Hypertension   . Lesion of ulnar nerve 12/05/2012   Right side  . Lumbar spinal stenosis   . Lung cancer ([facility])   . Neuropathy ([facility])   . Nicotine addiction   . Obesity   . Osteoarthritis   . Osteoporosis   . Pneumonia    hx  . Polyneuropathy in other diseases classified elsewhere ([facility]) 12/05/2012  . Shortness of breath dyspnea   . Vertigo   . Vitamin D deficiency   . Wears dentures    bottom    Family History  Problem Relation Age of Onset  . Cancer Father     Asbestos  . Colon cancer Neg Hx   . Colon polyps Neg Hx   . Kidney disease Neg Hx   . Esophageal cancer Neg Hx   . Gallbladder disease Neg Hx   . Heart disease Neg Hx   . Diabetes Neg Hx     Past Surgical History:  Procedure Laterality Date  . ANKLE ARTHROPLASTY Right 04/20/2015  . BLADDER SURGERY  02/2011   For Diverticulum of bladder removed  . CARPAL TUNNEL RELEASE Right 07/09/2014   Procedure: RIGHT OPEN CARPAL TUNNEL RELEASE;  Surgeon: Mcarthur Rossetti, MD;  [facility];  Service: Orthopedics;  Laterality: Right;  . CHOLECYSTECTOMY N/A 02/24/2014   Procedure: LAPAROSCOPIC CHOLECYSTECTOMY;  Surgeon: Coralie Keens, MD;  Location: [facility];  Service:  General;  Laterality: N/A;  . colon polyp resection    . COLONOSCOPY    . CYSTO    . TOTAL ANKLE ARTHROPLASTY Right 04/20/2015   Procedure: TOTAL ANKLE ARTHOPLASTY;  Surgeon: Newt Minion, MD;  Location: [facility];  Service: Orthopedics;  Laterality: Right;  [name] VIDEO ASSISTED THORACOSCOPY (VATS)/ LOBECTOMY Right 07/04/2015   Procedure: VIDEO ASSISTED THORACOSCOPY (VATS)/ RIGHT UPPER LOBECTOMY;  Surgeon: Melrose Nakayama, MD;  Location: [facility];  Service: Thoracic;  Laterality: Right;  [name] VIDEO  BRONCHOSCOPY N/A 06/17/2015   Procedure: VIDEO BRONCHOSCOPY;  Surgeon: Melrose Nakayama, MD;  Location: [facility];  Service: Thoracic;  Laterality: N/A;  . VIDEO BRONCHOSCOPY WITH ENDOBRONCHIAL NAVIGATION N/A 06/17/2015   Procedure: VIDEO BRONCHOSCOPY WITH ENDOBRONCHIAL NAVIGATION;  Surgeon: Melrose Nakayama, MD;  Location: [facility];  Service: Thoracic;  Laterality: N/A;  . VIDEO BRONCHOSCOPY WITH ENDOBRONCHIAL ULTRASOUND N/A 06/17/2015   Procedure: VIDEO BRONCHOSCOPY WITH ENDOBRONCHIAL ULTRASOUND;  Surgeon: Melrose Nakayama, MD;  Location: [facility];  Service: Thoracic;  Laterality: N/A;   Social History   Occupational History  . retired    Social History Main Topics  . Smoking status: Former Smoker    Packs/day: 0.50    Years: 35.00    Types: Cigarettes    Quit date: 02/13/2015  . Smokeless tobacco: Never Used  . Alcohol use No  . Drug use: No  . Sexual activity: Not on file

## 2016-03-09 ENCOUNTER — Ambulatory Visit (INDEPENDENT_AMBULATORY_CARE_PROVIDER_SITE_OTHER): Payer: Medicare Other | Admitting: Orthopedic Surgery

## 2016-03-12 DIAGNOSIS — L218 Other seborrheic dermatitis: Secondary | ICD-10-CM | POA: Diagnosis not present

## 2016-03-12 DIAGNOSIS — L304 Erythema intertrigo: Secondary | ICD-10-CM | POA: Diagnosis not present

## 2016-03-26 DIAGNOSIS — R42 Dizziness and giddiness: Secondary | ICD-10-CM

## 2016-03-26 HISTORY — DX: Dizziness and giddiness: R42

## 2016-04-12 DIAGNOSIS — M5030 Other cervical disc degeneration, unspecified cervical region: Secondary | ICD-10-CM | POA: Diagnosis not present

## 2016-04-12 DIAGNOSIS — G629 Polyneuropathy, unspecified: Secondary | ICD-10-CM | POA: Diagnosis not present

## 2016-04-25 DIAGNOSIS — E1351 Other specified diabetes mellitus with diabetic peripheral angiopathy without gangrene: Secondary | ICD-10-CM | POA: Diagnosis not present

## 2016-04-25 DIAGNOSIS — L602 Onychogryphosis: Secondary | ICD-10-CM | POA: Diagnosis not present

## 2016-05-01 ENCOUNTER — Telehealth: Payer: Self-pay | Admitting: *Deleted

## 2016-05-01 NOTE — Telephone Encounter (Signed)
CALLED PATIENT TO INFORM OF STAT LABS AND CT ON 05-02-16 AND HIS FU ON 05-03-16 WITH DR. ZAGAR  @ 11:30 AM, SPOKE WITH PATIENT AND HE IS AWARE OF THESE APPTS.

## 2016-05-02 ENCOUNTER — Ambulatory Visit (INDEPENDENT_AMBULATORY_CARE_PROVIDER_SITE_OTHER): Payer: Medicare Other

## 2016-05-02 ENCOUNTER — Ambulatory Visit (INDEPENDENT_AMBULATORY_CARE_PROVIDER_SITE_OTHER): Payer: Medicare Other | Admitting: Family

## 2016-05-02 ENCOUNTER — Encounter (INDEPENDENT_AMBULATORY_CARE_PROVIDER_SITE_OTHER): Payer: Self-pay | Admitting: Family

## 2016-05-02 ENCOUNTER — Ambulatory Visit: Payer: Medicare Other

## 2016-05-02 ENCOUNTER — Ambulatory Visit (HOSPITAL_COMMUNITY): Payer: Medicare Other

## 2016-05-02 VITALS — Ht 77.0 in | Wt 251.0 lb

## 2016-05-02 DIAGNOSIS — Z96661 Presence of right artificial ankle joint: Secondary | ICD-10-CM | POA: Diagnosis not present

## 2016-05-02 DIAGNOSIS — R200 Anesthesia of skin: Secondary | ICD-10-CM | POA: Diagnosis not present

## 2016-05-02 DIAGNOSIS — M79671 Pain in right foot: Secondary | ICD-10-CM

## 2016-05-02 DIAGNOSIS — R202 Paresthesia of skin: Secondary | ICD-10-CM

## 2016-05-03 ENCOUNTER — Ambulatory Visit (INDEPENDENT_AMBULATORY_CARE_PROVIDER_SITE_OTHER): Payer: TRICARE For Life (TFL) | Admitting: Orthopedic Surgery

## 2016-05-03 NOTE — Progress Notes (Addendum)
Office Visit Note   Patient: Jonathan [name]           Date of Birth: 10-17-1940           MRN: [id number] Visit Date: 05/02/2016              Requested by: Celedonio Savage, MD [address], [place] [address] PCP: Celedonio Savage, MD  Chief Complaint  Patient presents with  . Right Foot - Pain    HPI: The patient is a 76 year old gentleman who is status post total ankle replacement on the right. Today presents complaining of numbness to the foot. Has chronic ankle pain. Has lower extremity edema on the right which is managed by compression stockings which she wears around-the-clock daily.  The patient drives for a living. Complains that he has difficulty driving due to ankle pain. Complains that he is unable to hold down the gas pedal with his right foot due to ankle and knee pain. Today is concerned about numbness that may make driving unsafe. Recently submitted a claim to the [place] for hand controls put on his car. This claim was denied. Is requesting a disability rating today.    Assessment & Plan: Visit Diagnoses:  1. Pain in right foot   2. History of total replacement of right ankle     Plan: Due to total ankle replacement on the right patient has a permanent partial impairment of 50%.   no weakness in the foot or ankle. The patient him was unable to feel any of the monofilament testing. Will set him up for some EMGs with Dr. Ernestina Patches.  Follow-Up Instructions: Return if symptoms worsen or fail to improve.   Ortho Exam Physical Exam  Constitutional: Appears well-developed.  Head: Normocephalic.  Eyes: EOM are normal.  Neck: Normal range of motion.  Cardiovascular: Normal rate.   Pulmonary/Chest: Effort normal.  Neurological: Is alert.  Skin: Skin is warm.  Psychiatric: Has a normal mood and affect. Right ankle: Dorsiflexion and plantarflexion intact. Is tender to palpation over the distal tibia as well as fibula. There is no tenderness across the anterior ankle joint.  No pain with range of motion. Does have trace edema to the right lower extremity. Has a strong dorsalis pedis pulse.   Monofilament testing performed: Sensory exam of the foot is abnormal, poor plantar sensation, tested with the monofilament. Good pulses, no lesions or ulcers, good peripheral pulses.   Imaging: Xr Foot 2 Views Right  Result Date: 05/02/2016 Radiographs of the right foot are negative for acute bony abnormality. Does have some osteophytic bone spurring around the MTP joint of the great toe.   Orders:  Orders Placed This Encounter  Procedures  . XR Foot 2 Views Right  . XR Ankle Complete Right   No orders of the defined types were placed in this encounter.    Procedures: No procedures performed  Clinical Data: No additional findings.  Subjective: Review of Systems  Constitutional: Negative for chills and fever.  Musculoskeletal: Positive for arthralgias. Negative for gait problem.  Neurological: Positive for numbness. Negative for weakness.    Objective: Vital Signs: Ht '6\' 5"'$  (1.956 m)   Wt 251 lb (113.9 kg)   BMI 29.76 kg/m   Specialty Comments:  No specialty comments available.  PMFS History: Patient Active Problem List   Diagnosis Date Noted  . Fibrosis of subtalar joint, right 03/08/2016  . Primary malignant neoplasm of left upper lobe of lung ([facility]) 09/21/2015  .  Lung cancer ([facility]) 07/04/2015  . Diabetes mellitus type II, controlled ([facility]) 06/01/2015  . 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 Emphysema of lung ([facility])   . Kidney stones   . Osteoporosis   . H/O total ankle replacement 04/20/2015  . Carpal tunnel syndrome - right 07/09/2014  . Abdominal bloating 06/14/2014  . History of colonic polyps 06/14/2014  . Polyneuropathy in other diseases classified elsewhere ([facility]) 12/05/2012  . Lesion of ulnar nerve 12/05/2012  . Syncope 11/15/2012    . Gout 11/15/2012  . Constipation 04/02/2009  . CYSTITIS, RECURRENT 12/02/2008  . COPD 10/26/2008  . DIVERTICULUM, BLADDER 10/11/2008  . INSOMNIA 07/24/2008  . ACUTE CYSTITIS 07/20/2008  . FATIGUE 07/20/2008  . DEPRESSION 02/27/2008  . CHEST PAIN UNSPECIFIED 02/27/2008  . NICOTINE ADDICTION 08/12/2007  . DYSLIPIDEMIA 07/22/2007  . OBESITY 07/22/2007  . ERECTILE DYSFUNCTION 07/22/2007  . HYPERTENSION 07/22/2007   Past Medical History:  Diagnosis Date  . Abdominal aortic aneurysm ([facility])    pt denies-  . Anxiety   . Arthritis    ankle, hands , herniated disc L3-4  . CKD (chronic kidney disease)    pt denies  . COPD (chronic obstructive pulmonary disease) ([facility])    normally have breathing tx prior to surgery  . Depression   . Diabetes mellitus without complication ([facility])    borderline no med, controlled with diet  . Dyslipidemia   . Elevated WBCs September 2011   and UTI Hospitalized   . Emphysema of lung ([facility])   . Erectile dysfunction   . Fatty liver   . Gait disorder   . Gastritis   . GERD (gastroesophageal reflux disease)   . Hiatal hernia   . History of colon polyps   . History of kidney stones   . History of kidney stones   . Hypertension   . Lesion of ulnar nerve 12/05/2012   Right side  . Lumbar spinal stenosis   . Lung cancer ([facility])   . Neuropathy ([facility])   . Nicotine addiction   . Obesity   . Osteoarthritis   . Osteoporosis   . Pneumonia    hx  . Polyneuropathy in other diseases classified elsewhere ([facility]) 12/05/2012  . Shortness of breath dyspnea   . Vertigo   . Vitamin D deficiency   . Wears dentures    bottom    Family History  Problem Relation Age of Onset  . Cancer Father     Asbestos  . Colon cancer Neg Hx   . Colon polyps Neg Hx   . Kidney disease Neg Hx   . Esophageal cancer Neg Hx   . Gallbladder disease Neg Hx   . Heart disease Neg Hx   . Diabetes Neg Hx     Past Surgical History:  Procedure Laterality Date  . ANKLE ARTHROPLASTY  Right 04/20/2015  . BLADDER SURGERY  02/2011   For Diverticulum of bladder removed  . CARPAL TUNNEL RELEASE Right 07/09/2014   Procedure: RIGHT OPEN CARPAL TUNNEL RELEASE;  Surgeon: Mcarthur Rossetti, MD;  [facility];  Service: Orthopedics;  Laterality: Right;  . CHOLECYSTECTOMY N/A 02/24/2014   Procedure: LAPAROSCOPIC CHOLECYSTECTOMY;  Surgeon: Coralie Keens, MD;  Location: [facility];  Service: General;  Laterality: N/A;  . colon polyp resection    . COLONOSCOPY    . CYSTO    . TOTAL ANKLE ARTHROPLASTY  Right 04/20/2015   Procedure: TOTAL ANKLE ARTHOPLASTY;  Surgeon: Newt Minion, MD;  Location: [facility];  Service: Orthopedics;  Laterality: Right;  [name] VIDEO ASSISTED THORACOSCOPY (VATS)/ LOBECTOMY Right 07/04/2015   Procedure: VIDEO ASSISTED THORACOSCOPY (VATS)/ RIGHT UPPER LOBECTOMY;  Surgeon: Melrose Nakayama, MD;  Location: [facility];  Service: Thoracic;  Laterality: Right;  [name] VIDEO BRONCHOSCOPY N/A 06/17/2015   Procedure: VIDEO BRONCHOSCOPY;  Surgeon: Melrose Nakayama, MD;  Location: [facility];  Service: Thoracic;  Laterality: N/A;  . VIDEO BRONCHOSCOPY WITH ENDOBRONCHIAL NAVIGATION N/A 06/17/2015   Procedure: VIDEO BRONCHOSCOPY WITH ENDOBRONCHIAL NAVIGATION;  Surgeon: Melrose Nakayama, MD;  Location: [facility];  Service: Thoracic;  Laterality: N/A;  . VIDEO BRONCHOSCOPY WITH ENDOBRONCHIAL ULTRASOUND N/A 06/17/2015   Procedure: VIDEO BRONCHOSCOPY WITH ENDOBRONCHIAL ULTRASOUND;  Surgeon: Melrose Nakayama, MD;  Location: [facility];  Service: Thoracic;  Laterality: N/A;   Social History   Occupational History  . retired    Social History Main Topics  . Smoking status: Former Smoker    Packs/day: 0.50    Years: 35.00    Types: Cigarettes    Quit date: 02/13/2015  . Smokeless tobacco: Never Used  . Alcohol use No  . Drug use: No  . Sexual activity: Not on file

## 2016-05-04 NOTE — Addendum Note (Signed)
Addended by: Dondra Prader R on: 05/04/2016 11:27 AM   Modules accepted: Orders

## 2016-05-17 ENCOUNTER — Encounter (INDEPENDENT_AMBULATORY_CARE_PROVIDER_SITE_OTHER): Payer: Medicare Other | Admitting: Physical Medicine and Rehabilitation

## 2016-05-21 ENCOUNTER — Ambulatory Visit (INDEPENDENT_AMBULATORY_CARE_PROVIDER_SITE_OTHER): Payer: Medicare Other | Admitting: Physical Medicine and Rehabilitation

## 2016-05-21 ENCOUNTER — Encounter (INDEPENDENT_AMBULATORY_CARE_PROVIDER_SITE_OTHER): Payer: Self-pay | Admitting: Physical Medicine and Rehabilitation

## 2016-05-21 DIAGNOSIS — R202 Paresthesia of skin: Secondary | ICD-10-CM | POA: Diagnosis not present

## 2016-05-21 NOTE — Progress Notes (Signed)
Jonathan [name] - 76 y.o. male MRN [id number]  Date of birth: Nov 18, 1940  Office Visit Note: Visit Date: 05/21/2016 PCP: Jonathan Savage, MD Referred by: Jonathan Savage, MD  Subjective: Chief Complaint  Patient presents with  . Right Foot - Numbness   HPI: Jonathan [name] is a 76 year old [place] veteran. He reports having had right ankle surgery in January. This was a replacement of the ankle. He reports problems with the ankle after a fall and relates all of this to problems she's been having with his back and leg. He describes right ankle swelling. He does a lot of driving for work and he has to drive with the right side of his foot on the pedal due to his height. The patient is over 6 foot 8 inches tall. He says driving causes of pain and swelling. He has a history of peripheral neuropathy. He does not relate any of his symptoms in the foot with numbness to the peripheral neuropathy. He reports at the medial foot and ankle is tender. He has a history of lung cancer. He relates his lung cancer to agent orange. He is very concerned about getting someone to pay for modifications to his car so he can drive better. He tells me all his other injuries or 100% service connected. He is a type II diabetic.   Had right ankle surgery in January. Now having numbness on bottom of right foot. Has right ankle swelling. Has to drive with side of foot on pedal due to height. Causes increased pain and swelling. Also has peripheral neuropathy. Medial foot/ ankle is tender. ROS Otherwise per HPI.  Assessment & Plan: Visit Diagnoses:  1. Paresthesia of skin     Plan: No additional findings.  Impression: The above electrodiagnostic study is ABNORMAL and reveals evidence of length-dependent demyelinating and axonal sensorimotor polyneuropathy of the right lower extremity. There is no significant electrodiagnostic evidence of any other focal nerve entrapment, lumbosacral plexopathy or lumbar radiculopathy. This finding  is consistent with diabetic polyneuropathy.  Recommendations: 1.  Follow-up with referring physician. 2.  Continue current management of symptoms. 3.  Suggest full electrodiagnostic study of three limbs by neurology if felt necessary to confirm polyneuropathy. He he carries a diagnosis of polyneuropathy.  Meds & Orders: No orders of the defined types were placed in this encounter.   Orders Placed This Encounter  Procedures  . NCV with EMG (electromyography)    Follow-up: Return for Scheduled follow-up with Jonathan [name].   Procedures: No procedures performed  EMG & NCV Findings: Evaluation of the right fibular motor nerve showed prolonged distal onset latency (7.0 ms), reduced amplitude (0.2 mV), decreased conduction velocity (B Fib-Ankle, 37 m/s), and decreased conduction velocity (Poplt-B Fib, 37 m/s).  The right tibial motor nerve showed prolonged distal onset latency (6.4 ms), reduced amplitude (0.2 mV), and decreased conduction velocity (Knee-Ankle, 34 m/s).  The right saphenous sensory nerve showed no response (14cm).  The right superficial fibular sensory nerve showed prolonged distal peak latency (5.9 ms), reduced amplitude (2.2 V), and decreased conduction velocity (14 cm-Ant Lat Mall, 24 m/s).  The right sural sensory nerve showed prolonged distal peak latency (9.9 ms), reduced amplitude (0.5 V), and decreased conduction velocity (Calf-Lat Mall, 14 m/s).    All examined muscles (as indicated in the following table) showed no evidence of electrical instability.    Impression: The above electrodiagnostic study is ABNORMAL and reveals evidence of length-dependent demyelinating and axonal sensorimotor polyneuropathy of the right lower extremity. There  is no significant electrodiagnostic evidence of any other focal nerve entrapment, lumbosacral plexopathy or lumbar radiculopathy. This finding is consistent with diabetic polyneuropathy.  Recommendations: 1.  Follow-up with referring  physician. 2.  Continue current management of symptoms. 3.  Suggest full electrodiagnostic study of three limbs by neurology if felt necessary to confirm polyneuropathy. He he carries a diagnosis of polyneuropathy.  Nerve Conduction Studies Anti Sensory Summary Table   Stim Site NR Peak (ms) Norm Peak (ms) P-T Amp (V) Norm P-T Amp Site1 Site2 Delta-P (ms) Dist (cm) Vel (m/s) Norm Vel (m/s)  Right Saphenous Anti Sensory (Ant Med Mall)  32.3C  14cm *NR  <4.4  >2 14cm Ant Med Mall  0.0  >32  Right Sup Fibular Anti Sensory (Ant Lat Mall)  32.3C  14 cm    *5.9 <4.4 *2.2 >5.0 14 cm Ant Lat Mall 5.9 14.0 *24 >32  Right Sural Anti Sensory (Lat Mall)  32.3C  Calf    *9.9 <4.0 *0.5 >5.0 Calf Lat Mall 9.9 14.0 *14 >35   Motor Summary Table   Stim Site NR Onset (ms) Norm Onset (ms) O-P Amp (mV) Norm O-P Amp Site1 Site2 Delta-0 (ms) Dist (cm) Vel (m/s) Norm Vel (m/s)  Right Fibular Motor (Ext Dig Brev)  32.1C  Ankle    *7.0 <6.1 *0.2 >2.5 B Fib Ankle 10.7 40.0 *37 >38  B Fib    17.7  0.6  Poplt B Fib 3.1 11.5 *37 >40  Poplt    20.8  0.6         Right Tibial Motor (Abd Hall Brev)  32.4C  Ankle    *6.4 <6.1 *0.2 >3.0 Knee Ankle 14.5 49.0 *34 >35  Knee    20.9  0.1          EMG   Side Muscle Nerve Root Ins Act Fibs Psw Amp Dur Poly Recrt Int Fraser Din Comment  Right AntTibialis Dp Br Peron L4-5 Nml Nml Nml Nml Nml 0 Nml Nml   Right Fibularis Longus  Sup Br Peron L5-S1 Nml Nml Nml Nml Nml 0 Nml Nml   Right MedGastroc Tibial S1-2 Nml Nml Nml Nml Nml 0 Nml Nml   Right VastusMed Femoral L2-4 Nml Nml Nml Nml Nml 0 Nml Nml   Right BicepsFemS Sciatic L5-S1 Nml Nml Nml Nml Nml 0 Nml Nml     Nerve Conduction Studies Anti Sensory Left/Right Comparison   Stim Site L Lat (ms) R Lat (ms) L-R Lat (ms) L Amp (V) R Amp (V) L-R Amp (%) Site1 Site2 L Vel (m/s) R Vel (m/s) L-R Vel (m/s)  Saphenous Anti Sensory (Ant Med Mall)  32.3C  14cm       14cm Ant Med Mall     Sup Fibular Anti Sensory (Ant Lat Mall)   32.3C  14 cm  *5.9   *2.2  14 cm Ant Lat Mall  *24   Sural Anti Sensory (Lat Mall)  32.3C  Calf  *9.9   *0.5  Calf Lat Mall  *14    Motor Left/Right Comparison   Stim Site L Lat (ms) R Lat (ms) L-R Lat (ms) L Amp (mV) R Amp (mV) L-R Amp (%) Site1 Site2 L Vel (m/s) R Vel (m/s) L-R Vel (m/s)  Fibular Motor (Ext Dig Brev)  32.1C  Ankle  *7.0   *0.2  B Fib Ankle  *37   B Fib  17.7   0.6  Poplt B Fib  *37   Poplt  20.8  0.6        Tibial Motor (Abd Hall Brev)  32.4C  Ankle  *6.4   *0.2  Knee Ankle  *34   Knee  20.9   0.1           Clinical History: No specialty comments available.  He reports that he quit smoking about 15 months ago. His smoking use included Cigarettes. He has a 17.50 pack-year smoking history. He has never used smokeless tobacco.   Recent Labs  06/13/15 0853  HGBA1C 6.3*    Objective:  VS:  HT:    WT:   BMI:     BP:   HR: bpm  TEMP: ( )  RESP:  Physical Exam  Constitutional: He is oriented to person, place, and time.  Musculoskeletal:  Patient's right foot shows well-healed surgical scar. There is some swelling in general around the ankle. He has pretty good range of motion. He is able to dorsiflex and plantarflex with good strength. He can extend his toes with good strength. He has decreased sensation to light touch in all dermatomal patterns. He has normal bulk in the lower extremity calf musculature bilaterally.  Neurological: He is alert and oriented to person, place, and time. A sensory deficit is present. He exhibits normal muscle tone. Coordination normal.    Ortho Exam Imaging: No results found.  Past Medical/Family/Surgical/Social History: Medications & Allergies reviewed per EMR Patient Active Problem List   Diagnosis Date Noted  . Fibrosis of subtalar joint, right 03/08/2016  . Primary malignant neoplasm of left upper lobe of lung ([place]) 09/21/2015  . Lung cancer ([place]) 07/04/2015  . Diabetes mellitus type II, controlled ([place]) 06/01/2015    . 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 Emphysema of lung ([place])   . Kidney stones   . Osteoporosis   . H/O total ankle replacement 04/20/2015  . Carpal tunnel syndrome - right 07/09/2014  . Abdominal bloating 06/14/2014  . History of colonic polyps 06/14/2014  . Polyneuropathy in other diseases classified elsewhere ([place]) 12/05/2012  . Lesion of ulnar nerve 12/05/2012  . Syncope 11/15/2012  . Gout 11/15/2012  . Constipation 04/02/2009  . CYSTITIS, RECURRENT 12/02/2008  . COPD 10/26/2008  . DIVERTICULUM, BLADDER 10/11/2008  . INSOMNIA 07/24/2008  . ACUTE CYSTITIS 07/20/2008  . FATIGUE 07/20/2008  . DEPRESSION 02/27/2008  . CHEST PAIN UNSPECIFIED 02/27/2008  . NICOTINE ADDICTION 08/12/2007  . DYSLIPIDEMIA 07/22/2007  . OBESITY 07/22/2007  . ERECTILE DYSFUNCTION 07/22/2007  . HYPERTENSION 07/22/2007   Past Medical History:  Diagnosis Date  . Abdominal aortic aneurysm ([place])    pt denies-  . Anxiety   . Arthritis    ankle, hands , herniated disc L3-4  . CKD (chronic kidney disease)    pt denies  . COPD (chronic obstructive pulmonary disease) ([place])    normally have breathing tx prior to surgery  . Depression   . Diabetes mellitus without complication ([place])    borderline no med, controlled with diet  . Dyslipidemia   . Elevated WBCs September 2011   and UTI Hospitalized   . Emphysema of lung ([place])   . Erectile dysfunction   . Fatty liver   . Gait disorder   . Gastritis   . GERD (gastroesophageal reflux disease)   . Hiatal hernia   . History of colon polyps   . History of kidney stones   .  History of kidney stones   . Hypertension   . Lesion of ulnar nerve 12/05/2012   Right side  . Lumbar spinal stenosis   . Lung cancer ([place])   . Neuropathy ([place])   . Nicotine addiction   . Obesity   . Osteoarthritis   . Osteoporosis   . Pneumonia    hx  .  Polyneuropathy in other diseases classified elsewhere ([place]) 12/05/2012  . Shortness of breath dyspnea   . Vertigo   . Vitamin D deficiency   . Wears dentures    bottom   Family History  Problem Relation Age of Onset  . Cancer Father     Asbestos  . Colon cancer Neg Hx   . Colon polyps Neg Hx   . Kidney disease Neg Hx   . Esophageal cancer Neg Hx   . Gallbladder disease Neg Hx   . Heart disease Neg Hx   . Diabetes Neg Hx    Past Surgical History:  Procedure Laterality Date  . ANKLE ARTHROPLASTY Right 04/20/2015  . BLADDER SURGERY  02/2011   For Diverticulum of bladder removed  . CARPAL TUNNEL RELEASE Right 07/09/2014   Procedure: RIGHT OPEN CARPAL TUNNEL RELEASE;  Surgeon: Mcarthur Rossetti, MD;  Location: [facility];  Service: Orthopedics;  Laterality: Right;  . CHOLECYSTECTOMY N/A 02/24/2014   Procedure: LAPAROSCOPIC CHOLECYSTECTOMY;  Surgeon: Coralie Keens, MD;  Location: [facility];  Service: General;  Laterality: N/A;  . colon polyp resection    . COLONOSCOPY    . CYSTO    . TOTAL ANKLE ARTHROPLASTY Right 04/20/2015   Procedure: TOTAL ANKLE ARTHOPLASTY;  Surgeon: Newt Minion, MD;  Location: [facility];  Service: Orthopedics;  Laterality: Right;  [name] VIDEO ASSISTED THORACOSCOPY (VATS)/ LOBECTOMY Right 07/04/2015   Procedure: VIDEO ASSISTED THORACOSCOPY (VATS)/ RIGHT UPPER LOBECTOMY;  Surgeon: Melrose Nakayama, MD;  Location: [facility];  Service: Thoracic;  Laterality: Right;  [name] VIDEO BRONCHOSCOPY N/A 06/17/2015   Procedure: VIDEO BRONCHOSCOPY;  Surgeon: Melrose Nakayama, MD;  Location: [facility];  Service: Thoracic;  Laterality: N/A;  . VIDEO BRONCHOSCOPY WITH ENDOBRONCHIAL NAVIGATION N/A 06/17/2015   Procedure: VIDEO BRONCHOSCOPY WITH ENDOBRONCHIAL NAVIGATION;  Surgeon: Melrose Nakayama, MD;  Location: [facility];  Service: Thoracic;  Laterality: N/A;  . VIDEO BRONCHOSCOPY WITH ENDOBRONCHIAL ULTRASOUND N/A 06/17/2015   Procedure: VIDEO BRONCHOSCOPY WITH ENDOBRONCHIAL  ULTRASOUND;  Surgeon: Melrose Nakayama, MD;  Location: [facility];  Service: Thoracic;  Laterality: N/A;   Social History   Occupational History  . retired    Social History Main Topics  . Smoking status: Former Smoker    Packs/day: 0.50    Years: 35.00    Types: Cigarettes    Quit date: 02/13/2015  . Smokeless tobacco: Never Used  . Alcohol use No  . Drug use: No  . Sexual activity: Not on file

## 2016-05-22 ENCOUNTER — Ambulatory Visit (INDEPENDENT_AMBULATORY_CARE_PROVIDER_SITE_OTHER): Payer: Medicare Other | Admitting: Orthopedic Surgery

## 2016-05-22 ENCOUNTER — Encounter (INDEPENDENT_AMBULATORY_CARE_PROVIDER_SITE_OTHER): Payer: Self-pay | Admitting: Orthopedic Surgery

## 2016-05-22 VITALS — Ht 77.0 in | Wt 251.0 lb

## 2016-05-22 DIAGNOSIS — I87323 Chronic venous hypertension (idiopathic) with inflammation of bilateral lower extremity: Secondary | ICD-10-CM | POA: Diagnosis not present

## 2016-05-22 DIAGNOSIS — E1142 Type 2 diabetes mellitus with diabetic polyneuropathy: Secondary | ICD-10-CM | POA: Diagnosis not present

## 2016-05-22 DIAGNOSIS — Z96661 Presence of right artificial ankle joint: Secondary | ICD-10-CM | POA: Diagnosis not present

## 2016-05-22 DIAGNOSIS — M21511 Acquired clawhand, right hand: Secondary | ICD-10-CM | POA: Insufficient documentation

## 2016-05-22 NOTE — Procedures (Signed)
EMG & NCV Findings: Evaluation of the right fibular motor nerve showed prolonged distal onset latency (7.0 ms), reduced amplitude (0.2 mV), decreased conduction velocity (B Fib-Ankle, 37 m/s), and decreased conduction velocity (Poplt-B Fib, 37 m/s).  The right tibial motor nerve showed prolonged distal onset latency (6.4 ms), reduced amplitude (0.2 mV), and decreased conduction velocity (Knee-Ankle, 34 m/s).  The right saphenous sensory nerve showed no response (14cm).  The right superficial fibular sensory nerve showed prolonged distal peak latency (5.9 ms), reduced amplitude (2.2 V), and decreased conduction velocity (14 cm-Ant Lat Mall, 24 m/s).  The right sural sensory nerve showed prolonged distal peak latency (9.9 ms), reduced amplitude (0.5 V), and decreased conduction velocity (Calf-Lat Mall, 14 m/s).    All examined muscles (as indicated in the following table) showed no evidence of electrical instability.    Impression: The above electrodiagnostic study is ABNORMAL and reveals evidence of length-dependent demyelinating and axonal sensorimotor polyneuropathy of the right lower extremity. There is no significant electrodiagnostic evidence of any other focal nerve entrapment, lumbosacral plexopathy or lumbar radiculopathy. This finding is consistent with diabetic polyneuropathy.  Recommendations: 1.  Follow-up with referring physician. 2.  Continue current management of symptoms. 3.  Suggest full electrodiagnostic study of three limbs by neurology if felt necessary to confirm polyneuropathy. He he carries a diagnosis of polyneuropathy.  Nerve Conduction Studies Anti Sensory Summary Table   Stim Site NR Peak (ms) Norm Peak (ms) P-T Amp (V) Norm P-T Amp Site1 Site2 Delta-P (ms) Dist (cm) Vel (m/s) Norm Vel (m/s)  Right Saphenous Anti Sensory (Ant Med Mall)  32.3C  14cm *NR  <4.4  >2 14cm Ant Med Mall  0.0  >32  Right Sup Fibular Anti Sensory (Ant Lat Mall)  32.3C  14 cm    *5.9 <4.4  *2.2 >5.0 14 cm Ant Lat Mall 5.9 14.0 *24 >32  Right Sural Anti Sensory (Lat Mall)  32.3C  Calf    *9.9 <4.0 *0.5 >5.0 Calf Lat Mall 9.9 14.0 *14 >35   Motor Summary Table   Stim Site NR Onset (ms) Norm Onset (ms) O-P Amp (mV) Norm O-P Amp Site1 Site2 Delta-0 (ms) Dist (cm) Vel (m/s) Norm Vel (m/s)  Right Fibular Motor (Ext Dig Brev)  32.1C  Ankle    *7.0 <6.1 *0.2 >2.5 B Fib Ankle 10.7 40.0 *37 >38  B Fib    17.7  0.6  Poplt B Fib 3.1 11.5 *37 >40  Poplt    20.8  0.6         Right Tibial Motor (Abd Hall Brev)  32.4C  Ankle    *6.4 <6.1 *0.2 >3.0 Knee Ankle 14.5 49.0 *34 >35  Knee    20.9  0.1          EMG   Side Muscle Nerve Root Ins Act Fibs Psw Amp Dur Poly Recrt Int Fraser Din Comment  Right AntTibialis Dp Br Peron L4-5 Nml Nml Nml Nml Nml 0 Nml Nml   Right Fibularis Longus  Sup Br Peron L5-S1 Nml Nml Nml Nml Nml 0 Nml Nml   Right MedGastroc Tibial S1-2 Nml Nml Nml Nml Nml 0 Nml Nml   Right VastusMed Femoral L2-4 Nml Nml Nml Nml Nml 0 Nml Nml   Right BicepsFemS Sciatic L5-S1 Nml Nml Nml Nml Nml 0 Nml Nml     Nerve Conduction Studies Anti Sensory Left/Right Comparison   Stim Site L Lat (ms) R Lat (ms) L-R Lat (ms) L Amp (V) R Amp (V) L-R  Amp (%) Site1 Site2 L Vel (m/s) R Vel (m/s) L-R Vel (m/s)  Saphenous Anti Sensory (Ant Med Mall)  32.3C  14cm       14cm Ant Med Mall     Sup Fibular Anti Sensory (Ant Lat Mall)  32.3C  14 cm  *5.9   *2.2  14 cm Ant Lat Mall  *24   Sural Anti Sensory (Lat Mall)  32.3C  Calf  *9.9   *0.5  Calf Lat Mall  *14    Motor Left/Right Comparison   Stim Site L Lat (ms) R Lat (ms) L-R Lat (ms) L Amp (mV) R Amp (mV) L-R Amp (%) Site1 Site2 L Vel (m/s) R Vel (m/s) L-R Vel (m/s)  Fibular Motor (Ext Dig Brev)  32.1C  Ankle  *7.0   *0.2  B Fib Ankle  *37   B Fib  17.7   0.6  Poplt B Fib  *37   Poplt  20.8   0.6        Tibial Motor (Abd Hall Brev)  32.4C  Ankle  *6.4   *0.2  Knee Ankle  *34   Knee  20.9   0.1

## 2016-05-22 NOTE — Progress Notes (Signed)
Office Visit Note   Patient: Jonathan [name]           Date of Birth: [birth year]           MRN: [id number] Visit Date: 05/22/2016              Requested by: Celedonio Savage, MD [place] [place], [place] [address] PCP: Celedonio Savage, MD  Chief Complaint  Patient presents with  . Right Leg - Follow-up    HPI: EMG follow up.    Assessment & Plan: Visit Diagnoses:  1. Diabetic polyneuropathy associated with type 2 diabetes mellitus ([place])   2. H/O total ankle replacement, right   3. 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 Idiopathic chronic venous hypertension of both lower extremities with inflammation     Plan: With patient's loss of use of the right ankle status post total ankle arthroplasty patient will require hand control for his motor vehicle. Patient does not have any other reconstruction options for the right lower extremity. Further fusion or amputation of the right lower extremity with the patient's only other options.  Follow-Up Instructions: Return if symptoms worsen or fail to improve.   Ortho Exam Examination patient is alert oriented no adenopathy well-dressed normal affect normal respiratory effort he does have an antalgic gait. Examination patient has an intrinsic minus hand on the right. There is thenar muscle atrophy and weakness. Patient has venous stasis swelling in both lower extremities and we will go to the medical supply store to obtain compression stockings. Patient does have a plantar grade foot on the right he states in order to drive he has to place his foot in a hindfoot varus and this places increased stress on his foot which is painful and potentially could disrupt the ligamentous balance of his ankle. Patient's EMG and nerve conduction studies were reviewed for the right lower extremity and the diagnostic studies are abnormal and are consistent with diabetic polyneuropathy. There are no electrodiagnostic evidence of any focal nerve  entrapment no evidence of lumbosacral or lumbar radiculopathy. Patient does not have protective sensation.  Imaging: No results found.  Orders:  No orders of the defined types were placed in this encounter.  No orders of the defined types were placed in this encounter.    Procedures: No procedures performed  Clinical Data: No additional findings.  Subjective: Review of Systems  Objective: Vital Signs: Ht '6\' 5"'$  (1.956 m)   Wt 251 lb (113.9 kg)   BMI 29.76 kg/m   Specialty Comments:  No specialty comments available.  PMFS History: Patient Active Problem List   Diagnosis Date Noted  . 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 Idiopathic chronic venous hypertension of both lower extremities with inflammation 05/22/2016  . Fibrosis of subtalar joint, right 03/08/2016  . Primary malignant neoplasm of left upper lobe of lung ([place]) 09/21/2015  . Lung cancer ([place]) 07/04/2015  . Diabetes mellitus type II, controlled ([place]) 06/01/2015  . 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 Emphysema of lung ([place])   . Kidney stones   . Osteoporosis   . H/O total ankle replacement, right 04/20/2015  . Carpal tunnel syndrome - right 07/09/2014  . Abdominal bloating 06/14/2014  . History of colonic polyps 06/14/2014  . Diabetic polyneuropathy associated with type 2 diabetes mellitus ([place]) 12/05/2012  .  Lesion of ulnar nerve 12/05/2012  . Syncope 11/15/2012  . Gout 11/15/2012  . Constipation 04/02/2009  . CYSTITIS, RECURRENT 12/02/2008  . COPD 10/26/2008  . DIVERTICULUM, BLADDER 10/11/2008  . INSOMNIA 07/24/2008  . ACUTE CYSTITIS 07/20/2008  . FATIGUE 07/20/2008  . DEPRESSION 02/27/2008  . CHEST PAIN UNSPECIFIED 02/27/2008  . NICOTINE ADDICTION 08/12/2007  . DYSLIPIDEMIA 07/22/2007  . OBESITY 07/22/2007  . ERECTILE DYSFUNCTION 07/22/2007  . HYPERTENSION  07/22/2007   Past Medical History:  Diagnosis Date  . Abdominal aortic aneurysm ([place])    pt denies-  . Anxiety   . Arthritis    ankle, hands , herniated disc L3-4  . CKD (chronic kidney disease)    pt denies  . COPD (chronic obstructive pulmonary disease) ([place])    normally have breathing tx prior to surgery  . Depression   . Diabetes mellitus without complication ([place])    borderline no med, controlled with diet  . Dyslipidemia   . Elevated WBCs September 2011   and UTI Hospitalized   . Emphysema of lung ([place])   . Erectile dysfunction   . Fatty liver   . Gait disorder   . Gastritis   . GERD (gastroesophageal reflux disease)   . Hiatal hernia   . History of colon polyps   . History of kidney stones   . History of kidney stones   . Hypertension   . Lesion of ulnar nerve 12/05/2012   Right side  . Lumbar spinal stenosis   . Lung cancer ([place])   . Neuropathy ([place])   . Nicotine addiction   . Obesity   . Osteoarthritis   . Osteoporosis   . Pneumonia    hx  . Polyneuropathy in other diseases classified elsewhere ([place]) 12/05/2012  . Shortness of breath dyspnea   . Vertigo   . Vitamin D deficiency   . Wears dentures    bottom    Family History  Problem Relation Age of Onset  . Cancer Father     Asbestos  . Colon cancer Neg Hx   . Colon polyps Neg Hx   . Kidney disease Neg Hx   . Esophageal cancer Neg Hx   . Gallbladder disease Neg Hx   . Heart disease Neg Hx   . Diabetes Neg Hx     Past Surgical History:  Procedure Laterality Date  . ANKLE ARTHROPLASTY Right 04/20/2015  . BLADDER SURGERY  02/2011   For Diverticulum of bladder removed  . CARPAL TUNNEL RELEASE Right 07/09/2014   Procedure: RIGHT OPEN CARPAL TUNNEL RELEASE;  Surgeon: Mcarthur Rossetti, MD;  [facility];  Service: Orthopedics;  Laterality: Right;  . CHOLECYSTECTOMY N/A 02/24/2014   Procedure: LAPAROSCOPIC CHOLECYSTECTOMY;  Surgeon: Coralie Keens, MD;  Location: [facility];   Service: General;  Laterality: N/A;  . colon polyp resection    . COLONOSCOPY    . CYSTO    . TOTAL ANKLE ARTHROPLASTY Right 04/20/2015   Procedure: TOTAL ANKLE ARTHOPLASTY;  Surgeon: Newt Minion, MD;  Location: [facility];  Service: Orthopedics;  Laterality: Right;  [name] VIDEO ASSISTED THORACOSCOPY (VATS)/ LOBECTOMY Right 07/04/2015   Procedure: VIDEO ASSISTED THORACOSCOPY (VATS)/ RIGHT UPPER LOBECTOMY;  Surgeon: Melrose Nakayama, MD;  Location: [facility];  Service: Thoracic;  Laterality: Right;  [name] VIDEO BRONCHOSCOPY N/A 06/17/2015   Procedure: VIDEO BRONCHOSCOPY;  Surgeon: Melrose Nakayama, MD;  Location: [facility];  Service: Thoracic;  Laterality: N/A;  . VIDEO BRONCHOSCOPY WITH ENDOBRONCHIAL NAVIGATION N/A 06/17/2015  Procedure: VIDEO BRONCHOSCOPY WITH ENDOBRONCHIAL NAVIGATION;  Surgeon: Melrose Nakayama, MD;  Location: [facility];  Service: Thoracic;  Laterality: N/A;  . VIDEO BRONCHOSCOPY WITH ENDOBRONCHIAL ULTRASOUND N/A 06/17/2015   Procedure: VIDEO BRONCHOSCOPY WITH ENDOBRONCHIAL ULTRASOUND;  Surgeon: Melrose Nakayama, MD;  Location: [facility];  Service: Thoracic;  Laterality: N/A;   Social History   Occupational History  . retired    Social History Main Topics  . Smoking status: Former Smoker    Packs/day: 0.50    Years: 35.00    Types: Cigarettes    Quit date: 02/13/2015  . Smokeless tobacco: Never Used  . Alcohol use No  . Drug use: No  . Sexual activity: Not on file

## 2016-05-27 DIAGNOSIS — Z87891 Personal history of nicotine dependence: Secondary | ICD-10-CM | POA: Diagnosis not present

## 2016-05-27 DIAGNOSIS — Z79899 Other long term (current) drug therapy: Secondary | ICD-10-CM | POA: Diagnosis not present

## 2016-05-27 DIAGNOSIS — K5900 Constipation, unspecified: Secondary | ICD-10-CM | POA: Diagnosis not present

## 2016-05-27 DIAGNOSIS — R0602 Shortness of breath: Secondary | ICD-10-CM | POA: Diagnosis not present

## 2016-05-27 DIAGNOSIS — G629 Polyneuropathy, unspecified: Secondary | ICD-10-CM | POA: Diagnosis not present

## 2016-05-27 DIAGNOSIS — Z85118 Personal history of other malignant neoplasm of bronchus and lung: Secondary | ICD-10-CM | POA: Diagnosis not present

## 2016-05-27 DIAGNOSIS — J449 Chronic obstructive pulmonary disease, unspecified: Secondary | ICD-10-CM | POA: Diagnosis not present

## 2016-05-27 DIAGNOSIS — K5901 Slow transit constipation: Secondary | ICD-10-CM | POA: Diagnosis not present

## 2016-05-27 DIAGNOSIS — K219 Gastro-esophageal reflux disease without esophagitis: Secondary | ICD-10-CM | POA: Diagnosis not present

## 2016-05-27 DIAGNOSIS — E119 Type 2 diabetes mellitus without complications: Secondary | ICD-10-CM | POA: Diagnosis not present

## 2016-05-27 DIAGNOSIS — I10 Essential (primary) hypertension: Secondary | ICD-10-CM | POA: Diagnosis not present

## 2016-05-27 DIAGNOSIS — Z87442 Personal history of urinary calculi: Secondary | ICD-10-CM | POA: Diagnosis not present

## 2016-05-27 DIAGNOSIS — Z7984 Long term (current) use of oral hypoglycemic drugs: Secondary | ICD-10-CM | POA: Diagnosis not present

## 2016-05-29 DIAGNOSIS — E1165 Type 2 diabetes mellitus with hyperglycemia: Secondary | ICD-10-CM | POA: Diagnosis not present

## 2016-07-05 DIAGNOSIS — R1013 Epigastric pain: Secondary | ICD-10-CM | POA: Diagnosis not present

## 2016-07-05 DIAGNOSIS — R198 Other specified symptoms and signs involving the digestive system and abdomen: Secondary | ICD-10-CM | POA: Diagnosis not present

## 2016-07-10 DIAGNOSIS — K224 Dyskinesia of esophagus: Secondary | ICD-10-CM | POA: Diagnosis not present

## 2016-07-10 DIAGNOSIS — R1013 Epigastric pain: Secondary | ICD-10-CM | POA: Diagnosis not present

## 2016-07-10 DIAGNOSIS — K449 Diaphragmatic hernia without obstruction or gangrene: Secondary | ICD-10-CM | POA: Diagnosis not present

## 2016-07-17 DIAGNOSIS — B351 Tinea unguium: Secondary | ICD-10-CM | POA: Diagnosis not present

## 2016-07-17 DIAGNOSIS — M79671 Pain in right foot: Secondary | ICD-10-CM | POA: Diagnosis not present

## 2016-07-17 DIAGNOSIS — M79672 Pain in left foot: Secondary | ICD-10-CM | POA: Diagnosis not present

## 2016-07-17 DIAGNOSIS — E139 Other specified diabetes mellitus without complications: Secondary | ICD-10-CM | POA: Diagnosis not present

## 2016-08-03 ENCOUNTER — Ambulatory Visit: Payer: Medicare Other | Admitting: Gastroenterology

## 2016-08-27 ENCOUNTER — Ambulatory Visit (INDEPENDENT_AMBULATORY_CARE_PROVIDER_SITE_OTHER): Payer: Medicare Other | Admitting: Gastroenterology

## 2016-08-27 ENCOUNTER — Encounter: Payer: Self-pay | Admitting: Gastroenterology

## 2016-08-27 VITALS — BP 126/88 | HR 72 | Ht >= 80 in | Wt 266.0 lb

## 2016-08-27 DIAGNOSIS — K5901 Slow transit constipation: Secondary | ICD-10-CM

## 2016-08-27 DIAGNOSIS — K219 Gastro-esophageal reflux disease without esophagitis: Secondary | ICD-10-CM | POA: Diagnosis not present

## 2016-08-27 DIAGNOSIS — Z86010 Personal history of colonic polyps: Secondary | ICD-10-CM
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
## 2016-10-02 DIAGNOSIS — K5900 Constipation, unspecified: Secondary | ICD-10-CM | POA: Diagnosis not present

## 2016-10-02 DIAGNOSIS — K3184 Gastroparesis: Secondary | ICD-10-CM | POA: Diagnosis not present

## 2016-10-02 DIAGNOSIS — R110 Nausea: Secondary | ICD-10-CM | POA: Diagnosis not present

## 2016-10-10 DIAGNOSIS — M79671 Pain in right foot: Secondary | ICD-10-CM | POA: Diagnosis not present

## 2016-10-10 DIAGNOSIS — M79672 Pain in left foot: Secondary | ICD-10-CM | POA: Diagnosis not present

## 2016-10-10 DIAGNOSIS — E1351 Other specified diabetes mellitus with diabetic peripheral angiopathy without gangrene: Secondary | ICD-10-CM | POA: Diagnosis not present

## 2016-10-10 DIAGNOSIS — G609 Hereditary and idiopathic neuropathy, unspecified: Secondary | ICD-10-CM | POA: Diagnosis not present

## 2016-10-10 DIAGNOSIS — B351 Tinea unguium: Secondary | ICD-10-CM | POA: Diagnosis not present

## 2016-10-18 DIAGNOSIS — M1990 Unspecified osteoarthritis, unspecified site: Secondary | ICD-10-CM | POA: Diagnosis not present

## 2016-10-18 DIAGNOSIS — J449 Chronic obstructive pulmonary disease, unspecified: Secondary | ICD-10-CM | POA: Diagnosis not present

## 2016-10-18 DIAGNOSIS — R109 Unspecified abdominal pain: Secondary | ICD-10-CM | POA: Diagnosis not present

## 2016-10-18 DIAGNOSIS — I10 Essential (primary) hypertension: Secondary | ICD-10-CM | POA: Diagnosis not present

## 2016-10-18 DIAGNOSIS — K5900 Constipation, unspecified: Secondary | ICD-10-CM | POA: Diagnosis not present

## 2016-10-18 DIAGNOSIS — Z87891 Personal history of nicotine dependence: Secondary | ICD-10-CM | POA: Diagnosis not present

## 2016-10-18 DIAGNOSIS — Z9049 Acquired absence of other specified parts of digestive tract: Secondary | ICD-10-CM | POA: Diagnosis not present

## 2016-10-18 DIAGNOSIS — E119 Type 2 diabetes mellitus without complications: Secondary | ICD-10-CM | POA: Diagnosis not present

## 2016-10-18 DIAGNOSIS — Z79899 Other long term (current) drug therapy: Secondary | ICD-10-CM | POA: Diagnosis not present

## 2016-10-18 DIAGNOSIS — Z8249 Family history of ischemic heart disease and other diseases of the circulatory system: Secondary | ICD-10-CM | POA: Diagnosis not present

## 2016-10-18 DIAGNOSIS — Z7984 Long term (current) use of oral hypoglycemic drugs: Secondary | ICD-10-CM | POA: Diagnosis not present

## 2016-10-25 ENCOUNTER — Emergency Department (HOSPITAL_COMMUNITY): Payer: Medicare Other

## 2016-10-25 ENCOUNTER — Emergency Department (HOSPITAL_COMMUNITY)
Admission: EM | Admit: 2016-10-25 | Discharge: 2016-10-25 | Disposition: A | Discharge status: Home / Self Care | Payer: Medicare Other | Attending: Emergency Medicine | Admitting: Emergency Medicine

## 2016-10-25 ENCOUNTER — Encounter (HOSPITAL_COMMUNITY): Payer: Self-pay

## 2016-10-25 DIAGNOSIS — Z87891 Personal history of nicotine dependence: Secondary | ICD-10-CM | POA: Insufficient documentation

## 2016-10-25 DIAGNOSIS — Z79899 Other long term (current) drug therapy: Secondary | ICD-10-CM | POA: Diagnosis not present

## 2016-10-25 DIAGNOSIS — Z7984 Long term (current) use of oral hypoglycemic drugs: Secondary | ICD-10-CM | POA: Diagnosis not present

## 2016-10-25 DIAGNOSIS — I10 Essential (primary) hypertension: Secondary | ICD-10-CM | POA: Insufficient documentation

## 2016-10-25 DIAGNOSIS — E119 Type 2 diabetes mellitus without complications: Secondary | ICD-10-CM | POA: Diagnosis not present

## 2016-10-25 DIAGNOSIS — R55 Syncope and collapse: Secondary | ICD-10-CM | POA: Diagnosis not present

## 2016-10-25 DIAGNOSIS — J449 Chronic obstructive pulmonary disease, unspecified: Secondary | ICD-10-CM | POA: Insufficient documentation

## 2016-10-25 DIAGNOSIS — R531 Weakness: Secondary | ICD-10-CM | POA: Diagnosis not present

## 2016-10-25 LAB — BASIC METABOLIC PANEL
Anion gap: 8 (ref 5–15)
BUN: 11 mg/dL (ref 6–20)
CO2: 28 mmol/L (ref 22–32)
Calcium: 9.5 mg/dL (ref 8.9–10.3)
Chloride: 103 mmol/L (ref 101–111)
Creatinine, Ser: 1.34 mg/dL — ABNORMAL HIGH (ref 0.61–1.24)
GFR calc Af Amer: 59 mL/min — ABNORMAL LOW (ref >60)
GFR calc non Af Amer: 51 mL/min — ABNORMAL LOW (ref >60)
Glucose, Bld: 92 mg/dL (ref 65–99)
Potassium: 3.2 mmol/L — ABNORMAL LOW (ref 3.5–5.1)
Sodium: 139 mmol/L (ref 135–145)

## 2016-10-25 LAB — CBC
HCT: 38.9 % — ABNORMAL LOW (ref 39.0–52.0)
Hemoglobin: 12.8 g/dL — ABNORMAL LOW (ref 13.0–17.0)
MCH: 29.9 pg (ref 26.0–34.0)
MCHC: 32.9 g/dL (ref 30.0–36.0)
MCV: 90.9 fL (ref 78.0–100.0)
Platelets: 205 10*3/uL (ref 150–400)
RBC: 4.28 MIL/uL (ref 4.22–5.81)
RDW: 13.9 % (ref 11.5–15.5)
WBC: 7.4 10*3/uL (ref 4.0–10.5)

## 2016-10-25 LAB — CBG MONITORING, ED
Glucose-Capillary: 93 mg/dL (ref 65–99)
Glucose-Capillary: 117 mg/dL — ABNORMAL HIGH (ref 65–99)

## 2016-10-25 LAB — HEPATIC FUNCTION PANEL
ALT: 17 U/L (ref 17–63)
AST: 19 U/L (ref 15–41)
Albumin: 3.6 g/dL (ref 3.5–5.0)
Alkaline Phosphatase: 40 U/L (ref 38–126)
Bilirubin, Direct: <0.1 mg/dL — ABNORMAL LOW (ref 0.1–0.5)
Total Bilirubin: 0.4 mg/dL (ref 0.3–1.2)
Total Protein: 7.1 g/dL (ref 6.5–8.1)

## 2016-10-25 NOTE — Discharge Instructions (Signed)
Continue your medications as previously prescribed.  Follow-up with cardiology to discuss a Holter monitor if your symptoms persist.  Return to the emergency department if your symptoms significantly worsen or change.

## 2016-10-25 NOTE — ED Notes (Signed)
Pt states that he has syncope episodes only when on the commode trying to have a bowel movement.  Pt states was exposed to [place] when in [place].

## 2016-10-25 NOTE — ED Triage Notes (Signed)
Pt presents for evaluation of syncopal episode in bathroom while visiting friend in hospital. Pt reports has been seeing multiple doctors about GI issues and reports "this happens sometimes." Pt AxO x4, appears uncomfortable in triage.

## 2016-10-25 NOTE — ED Provider Notes (Signed)
[facility] DEPT Provider Note   CSN: [id number] Arrival date & time: 10/25/16  0945     History   Chief Complaint Chief Complaint  Patient presents with  . Loss of Consciousness    HPI Jonathan [name] is a 76 y.o. male.  Patient is a 76 year old male with past medical history of hypertension, diabetes, COPD presenting for evaluation of near-syncope. He reports that he has had similar episodes over the past several years, however worse over the past 2 months. This morning when he was walking he became lightheaded and had to lean against the wall to hold himself up. He then lowered himself to the ground, but did not lose consciousness. These episodes last for several minutes to hours, then resolved. He currently has no complaints. He denies any recent symptoms other than feeling constipated.   The history is provided by the patient.  Loss of Consciousness   This is a new problem. The current episode started less than 1 hour ago. The problem occurs constantly. The problem has been resolved. There was no loss of consciousness. The problem is associated with normal activity. Pertinent negatives include abdominal pain, bladder incontinence, light-headedness and palpitations. He has tried nothing for the symptoms. The treatment provided no relief.    Past Medical History:  Diagnosis Date  . Abdominal aortic aneurysm ([place])    pt denies-  . Anxiety   . Arthritis    ankle, hands , herniated disc L3-4  . CKD (chronic kidney disease)    pt denies  . COPD (chronic obstructive pulmonary disease) ([place])    normally have breathing tx prior to surgery  . Depression   . Diabetes mellitus without complication ([place])    borderline no med, controlled with diet  . Dyslipidemia   . Elevated WBCs September 2011   and UTI Hospitalized   . Emphysema of lung ([place])   . Erectile dysfunction   . Fatty liver   . Gait disorder   . Gastritis   . GERD (gastroesophageal reflux disease)   . Hiatal  hernia   . History of colon polyps   . History of kidney stones   . History of kidney stones   . Hypertension   . Lesion of ulnar nerve 12/05/2012   Right side  . Lumbar spinal stenosis   . Lung cancer ([place])   . Neuropathy   . Nicotine addiction   . Obesity   . Osteoarthritis   . Osteoporosis   . Pneumonia    hx  . Polyneuropathy in other diseases classified elsewhere ([place]) 12/05/2012  . Shortness of breath dyspnea   . Vertigo   . Vitamin D deficiency   . Wears dentures    bottom    Patient Active Problem List   Diagnosis Date Noted  . 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 Idiopathic chronic venous hypertension of both lower extremities with inflammation 05/22/2016  . Fibrosis of subtalar joint, right 03/08/2016  . Primary malignant neoplasm of left upper lobe of lung ([place]) 09/21/2015  . Lung cancer ([place]) 07/04/2015  . Diabetes mellitus type II, controlled ([place]) 06/01/2015  . 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 Emphysema of lung ([place])   . Kidney stones   . Osteoporosis   . H/O total ankle replacement, right 04/20/2015  . Carpal tunnel syndrome - right 07/09/2014  . Abdominal bloating  06/14/2014  . History of colonic polyps 06/14/2014  . Diabetic polyneuropathy associated with type 2 diabetes mellitus ([place]) 12/05/2012  . Lesion of ulnar nerve 12/05/2012  . Syncope 11/15/2012  . Gout 11/15/2012  . Constipation 04/02/2009  . CYSTITIS, RECURRENT 12/02/2008  . COPD 10/26/2008  . DIVERTICULUM, BLADDER 10/11/2008  . INSOMNIA 07/24/2008  . ACUTE CYSTITIS 07/20/2008  . FATIGUE 07/20/2008  . DEPRESSION 02/27/2008  . CHEST PAIN UNSPECIFIED 02/27/2008  . NICOTINE ADDICTION 08/12/2007  . DYSLIPIDEMIA 07/22/2007  . OBESITY 07/22/2007  . ERECTILE DYSFUNCTION 07/22/2007  . HYPERTENSION 07/22/2007    Past Surgical History:  Procedure  Laterality Date  . ANKLE ARTHROPLASTY Right 04/20/2015  . BLADDER SURGERY  02/2011   For Diverticulum of bladder removed  . CARPAL TUNNEL RELEASE Right 07/09/2014   Procedure: RIGHT OPEN CARPAL TUNNEL RELEASE;  Surgeon: Mcarthur Rossetti, MD;  Location: [facility];  Service: Orthopedics;  Laterality: Right;  . CHOLECYSTECTOMY N/A 02/24/2014   Procedure: LAPAROSCOPIC CHOLECYSTECTOMY;  Surgeon: Coralie Keens, MD;  Location: [facility];  Service: General;  Laterality: N/A;  . colon polyp resection    . COLONOSCOPY    . CYSTO    . TOTAL ANKLE ARTHROPLASTY Right 04/20/2015   Procedure: TOTAL ANKLE ARTHOPLASTY;  Surgeon: Newt Minion, MD;  Location: [facility];  Service: Orthopedics;  Laterality: Right;  [name] VIDEO ASSISTED THORACOSCOPY (VATS)/ LOBECTOMY Right 07/04/2015   Procedure: VIDEO ASSISTED THORACOSCOPY (VATS)/ RIGHT UPPER LOBECTOMY;  Surgeon: Melrose Nakayama, MD;  Location: [facility];  Service: Thoracic;  Laterality: Right;  [name] VIDEO BRONCHOSCOPY N/A 06/17/2015   Procedure: VIDEO BRONCHOSCOPY;  Surgeon: Melrose Nakayama, MD;  Location: [facility];  Service: Thoracic;  Laterality: N/A;  . VIDEO BRONCHOSCOPY WITH ENDOBRONCHIAL NAVIGATION N/A 06/17/2015   Procedure: VIDEO BRONCHOSCOPY WITH ENDOBRONCHIAL NAVIGATION;  Surgeon: Melrose Nakayama, MD;  Location: [facility];  Service: Thoracic;  Laterality: N/A;  . VIDEO BRONCHOSCOPY WITH ENDOBRONCHIAL ULTRASOUND N/A 06/17/2015   Procedure: VIDEO BRONCHOSCOPY WITH ENDOBRONCHIAL ULTRASOUND;  Surgeon: Melrose Nakayama, MD;  Location: [facility];  Service: Thoracic;  Laterality: N/A;       Home Medications    Prior to Admission medications   Medication Sig Start Date End Date Taking? Authorizing Provider  albuterol (PROVENTIL HFA;VENTOLIN HFA) 108 (90 BASE) MCG/ACT inhaler Inhale 1 puff into the lungs every 6 (six) hours as needed for wheezing or shortness of breath.    [provider]  atenolol (TENORMIN) 25 MG tablet Take 1 tablet  (25 mg total) by mouth at bedtime. Patient taking differently: Take 25 mg by mouth every morning.  07/14/15   Nani Skillern, PA-C  esomeprazole (NEXIUM) 40 MG capsule Take 40 mg by mouth daily.     [provider]  ipratropium-albuterol (DUONEB) 0.5-2.5 (3) MG/3ML SOLN Take 3 mLs by nebulization daily as needed (wheezing).     [provider]  KLOR-CON M10 10 MEQ tablet Take 1 tablet by mouth daily. 06/01/15   [provider]  metFORMIN (GLUCOPHAGE) 500 MG tablet Take 250 mg by mouth daily.     [provider]  metoCLOPramide (REGLAN) 5 MG tablet Take 5 mg by mouth 4 (four) times daily -  before meals and at bedtime.     [provider]  Oxycodone HCl 10 MG TABS Take 2.5 mg by mouth 4 (four) times daily as needed. 07/18/15   [provider]  predniSONE (DELTASONE) 10 MG tablet Take 10 mg by mouth 2 (two) times  daily as needed (for gout symptoms). For gout per patient    [provider]  Probiotic Product (PROBIOTIC DAILY PO) Take 1 capsule by mouth daily as needed.    [provider]  sennosides-docusate sodium (SENOKOT-S) 8.6-50 MG tablet Take 1 tablet by mouth every other day.    [provider]  sildenafil (VIAGRA) 100 MG tablet Take 100 mg by mouth daily as needed.    [provider]  tamsulosin (FLOMAX) 0.4 MG CAPS capsule Take 0.4 mg by mouth daily as needed. Reported on 10/12/2015    [provider]  TENORETIC 50 50-25 MG tablet Take 1 tablet by mouth at bedtime. Reported on 10/12/2015 06/06/15   [provider]  tiotropium (SPIRIVA) 18 MCG inhalation capsule Place 18 mcg into inhaler and inhale daily as needed (Shortness of breath). Reported on 10/12/2015    [provider]  varenicline (CHANTIX STARTING MONTH PAK) 0.5 MG X 11 & 1 MG X 42 tablet Take one 0.5 mg tablet by mouth once daily for 3 days, then increase to one 0.5 mg tablet twice daily for 4 days, then increase to one  1 mg tablet twice daily. 09/06/15   Melrose Nakayama, MD    Family History Family History  Problem Relation Age of Onset  . Cancer Father        Asbestos  . Colon cancer Neg Hx   . Colon polyps Neg Hx   . Kidney disease Neg Hx   . Esophageal cancer Neg Hx   . Gallbladder disease Neg Hx   . Heart disease Neg Hx   . Diabetes Neg Hx     Social History Social History  Substance Use Topics  . Smoking status: Former Smoker    Packs/day: 0.50    Years: 35.00    Types: Cigarettes    Quit date: 02/13/2015  . Smokeless tobacco: Never Used  . Alcohol use No     Allergies   Lisinopril; Onion; Ciprofloxacin; Levaquin [levofloxacin]; and Triamterene-hctz   Review of Systems Review of Systems  Cardiovascular: Positive for syncope. Negative for palpitations.  Gastrointestinal: Negative for abdominal pain.  Genitourinary: Negative for bladder incontinence.  Neurological: Negative for light-headedness.  All other systems reviewed and are negative.    Physical Exam Updated Vital Signs BP 107/69 (BP Location: Right Arm)   Pulse 64   Temp 97.6 F (36.4 C) (Oral)   Resp 20   Ht 6\' 8"  (2.032 m)   Wt 118.4 kg (261 lb)   SpO2 97%   BMI 28.67 kg/m   Physical Exam  Constitutional: He is oriented to person, place, and time. He appears well-developed and well-nourished. No distress.  HENT:  Head: Normocephalic and atraumatic.  Mouth/Throat: Oropharynx is clear and moist.  Eyes: Pupils are equal, round, and reactive to light. EOM are normal.  Neck: Normal range of motion. Neck supple.  Cardiovascular: Normal rate and regular rhythm.  Exam reveals no friction rub.   No murmur heard. Pulmonary/Chest: Effort normal and breath sounds normal. No respiratory distress. He has no wheezes. He has no rales.  Abdominal: Soft. Bowel sounds are normal. He exhibits no distension. There is no tenderness.  Musculoskeletal: Normal range of motion. He exhibits no edema.  Neurological: He is  alert and oriented to person, place, and time. No cranial nerve deficit. He exhibits normal muscle tone. Coordination normal.  Skin: Skin is warm and dry. He is not diaphoretic.  Nursing note and vitals reviewed.  ED Treatments / Results  Labs (all labs ordered are listed, but only abnormal results are displayed) Labs Reviewed  BASIC METABOLIC PANEL - Abnormal; Notable for the following:       Result Value   Potassium 3.2 (*)    Creatinine, Ser 1.34 (*)    GFR calc non Af Amer 51 (*)    GFR calc Af Amer 59 (*)    All other components within normal limits  CBC - Abnormal; Notable for the following:    Hemoglobin 12.8 (*)    HCT 38.9 (*)    All other components within normal limits  URINALYSIS, ROUTINE W REFLEX MICROSCOPIC  HEPATIC FUNCTION PANEL  CBG MONITORING, ED    EKG  EKG Interpretation  Date/Time:  Thursday October 25 2016 09:50:36 EDT Ventricular Rate:  68 PR Interval:  156 QRS Duration: 86 QT Interval:  382 QTC Calculation: 406 R Axis:   56 Text Interpretation:  Sinus rhythm with Premature atrial complexes Otherwise normal ECG Confirmed by Veryl Speak [phone]) on 10/25/2016 11:01:48 AM       Radiology No results found.  Procedures Procedures (including critical care time)  Medications Ordered in ED Medications - No data to display   Initial Impression / Assessment and Plan / ED Course  I have reviewed the triage vital signs and the nursing notes.  Pertinent labs & imaging results that were available during my care of the patient were reviewed by me and considered in my medical decision making (see chart for details).  Patient presents with complaints of near syncope. He has been having these episodes intermittently he tells me for the past 3-4 years, however they have been worse over the past several months. His neurologic exam and physical examination are unremarkable. His EKG is unchanged and laboratory studies are unremarkable as well. I'm uncertain  as to whether this is some sort of vasovagal syncope, or other type of near syncope. He does take low-dose of atenolol, however his pressure and heart rate are normal. I've advised him to follow-up with cardiology to discuss a Holter monitor and review his medications.  Final Clinical Impressions(s) / ED Diagnoses   Final diagnoses:  None    New Prescriptions New Prescriptions   No medications on file     Veryl Speak, MD 10/25/16 1546

## 2016-11-05 DIAGNOSIS — K259 Gastric ulcer, unspecified as acute or chronic, without hemorrhage or perforation: Secondary | ICD-10-CM | POA: Diagnosis not present

## 2016-11-05 DIAGNOSIS — K2210 Ulcer of esophagus without bleeding: Secondary | ICD-10-CM | POA: Diagnosis not present

## 2016-11-05 DIAGNOSIS — K2980 Duodenitis without bleeding: Secondary | ICD-10-CM | POA: Diagnosis not present

## 2016-11-05 DIAGNOSIS — K317 Polyp of stomach and duodenum: Secondary | ICD-10-CM | POA: Diagnosis not present

## 2016-11-05 DIAGNOSIS — K2970 Gastritis, unspecified, without bleeding: Secondary | ICD-10-CM | POA: Diagnosis not present

## 2016-11-05 DIAGNOSIS — K219 Gastro-esophageal reflux disease without esophagitis: Secondary | ICD-10-CM | POA: Diagnosis not present

## 2016-11-06 DIAGNOSIS — G603 Idiopathic progressive neuropathy: Secondary | ICD-10-CM | POA: Diagnosis not present

## 2016-11-06 DIAGNOSIS — G629 Polyneuropathy, unspecified: Secondary | ICD-10-CM | POA: Diagnosis not present

## 2016-11-15 DIAGNOSIS — R202 Paresthesia of skin: Secondary | ICD-10-CM | POA: Diagnosis not present

## 2016-11-27 DIAGNOSIS — M79672 Pain in left foot: Secondary | ICD-10-CM | POA: Diagnosis not present

## 2016-11-27 DIAGNOSIS — M79671 Pain in right foot: Secondary | ICD-10-CM | POA: Diagnosis not present

## 2016-11-27 DIAGNOSIS — G609 Hereditary and idiopathic neuropathy, unspecified: Secondary | ICD-10-CM | POA: Diagnosis not present

## 2016-11-27 DIAGNOSIS — E1351 Other specified diabetes mellitus with diabetic peripheral angiopathy without gangrene: Secondary | ICD-10-CM | POA: Diagnosis not present

## 2016-12-19 DIAGNOSIS — Z6828 Body mass index (BMI) 28.0-28.9, adult: Secondary | ICD-10-CM | POA: Diagnosis not present

## 2016-12-19 DIAGNOSIS — E0865 Diabetes mellitus due to underlying condition with hyperglycemia: Secondary | ICD-10-CM | POA: Diagnosis not present

## 2016-12-19 DIAGNOSIS — I10 Essential (primary) hypertension: Secondary | ICD-10-CM | POA: Diagnosis not present

## 2016-12-19 DIAGNOSIS — M25571 Pain in right ankle and joints of right foot: Secondary | ICD-10-CM | POA: Diagnosis not present

## 2016-12-19 DIAGNOSIS — K5901 Slow transit constipation: Secondary | ICD-10-CM | POA: Diagnosis not present

## 2016-12-19 DIAGNOSIS — Z6829 Body mass index (BMI) 29.0-29.9, adult: Secondary | ICD-10-CM | POA: Insufficient documentation

## 2017-01-16 ENCOUNTER — Ambulatory Visit (INDEPENDENT_AMBULATORY_CARE_PROVIDER_SITE_OTHER): Payer: Medicare Other | Admitting: Orthopedic Surgery

## 2017-01-16 ENCOUNTER — Encounter (INDEPENDENT_AMBULATORY_CARE_PROVIDER_SITE_OTHER): Payer: Self-pay | Admitting: Orthopedic Surgery

## 2017-01-16 DIAGNOSIS — Z96661 Presence of right artificial ankle joint: Secondary | ICD-10-CM

## 2017-01-16 DIAGNOSIS — I87323 Chronic venous hypertension (idiopathic) with inflammation of bilateral lower extremity: Secondary | ICD-10-CM | POA: Diagnosis not present

## 2017-01-16 DIAGNOSIS — E1142 Type 2 diabetes mellitus with diabetic polyneuropathy: Secondary | ICD-10-CM | POA: Diagnosis not present

## 2017-01-16 NOTE — Progress Notes (Signed)
Office Visit Note   Patient: Jonathan [name]           Date of Birth: 19-Feb-1941           MRN: [id number] Visit Date: 01/16/2017              Requested by: Celedonio Savage, MD [place] [place], [place] [address] PCP: Celedonio Savage, MD  Chief Complaint  Patient presents with  . Right Ankle - Follow-up      HPI: Patient presents in follow-up status post right total ankle arthroplasty of the right ankle in April 20, 2015.  Patient states he still has swelling of both lower extremities is continue to wear his medical compression socks.  He is wearing the [place] AFO on the right leg.  Patient states that he just had a nerve density test performed of his skin which showed marked reduction of the nerve fibers within the epidermis consistent with neuropathy.  Assessment & Plan: Visit Diagnoses:  1. Diabetic polyneuropathy associated with type 2 diabetes mellitus ([place])   2. H/O total ankle replacement, right   3. Idiopathic chronic venous hypertension of both lower extremities with inflammation     Plan: Patient will continue with his medical compression stockings 15-20 mm of compression.  He was given a prescription for new stockings.  Patient will continue wearing his [place] AFO on the right to provide support for his total ankle.  Patient will follow-up in 3 months.  Follow-Up Instructions: Return in about 3 months (around 04/18/2017).   Ortho Exam  Patient is alert, oriented, no adenopathy, well-dressed, normal affect, normal respiratory effort. Examination patient does have an antalgic gait.  He has no skin lesions or abrasions around the ankle.  He does not have protective sensation he has excellent range of motion the ankle with approximately 10 degrees of dorsiflexion and approximately 30 degrees of plantarflexion.  Patient's foot is plantigrade he does have subtalar motion.  There is no venous stasis ulcers.  No redness no cellulitis no tenderness to palpation.  Ankle is  stable.  Imaging: No results found. No images are attached to the encounter.  Labs: Lab Results  Component Value Date   HGBA1C 6.3 (H) 06/13/2015   HGBA1C 6.6 (H) 04/18/2015   HGBA1C 6.0 (H) 11/15/2012   REPTSTATUS 07/29/2015 FINAL 07/27/2015   GRAMSTAIN  06/17/2015    NO WBC SEEN NO SQUAMOUS EPITHELIAL CELLS SEEN NO ORGANISMS SEEN Performed at [facility], SUGGEST RECOLLECTION (A) 07/27/2015   LABORGA STAPHYLOCOCCUS SPECIES (COAGULASE NEGATIVE) 02/24/2007    Orders:  No orders of the defined types were placed in this encounter.  No orders of the defined types were placed in this encounter.    Procedures: No procedures performed  Clinical Data: No additional findings.  ROS:  All other systems negative, except as noted in the HPI. Review of Systems  Objective: Vital Signs: There were no vitals taken for this visit.  Specialty Comments:  No specialty comments available.  PMFS History: Patient Active Problem List   Diagnosis Date Noted  . 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 Idiopathic chronic venous hypertension of both lower extremities with inflammation 05/22/2016  . Fibrosis of subtalar joint, right 03/08/2016  . Primary malignant neoplasm of left upper lobe of lung ([place]) 09/21/2015  . Lung cancer ([place]) 07/04/2015  . Diabetes mellitus type II, controlled ([place]) 06/01/2015  . Neoplasm of uncertain behavior of  right upper lobe of lung 05/31/2015  . 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 Emphysema of lung ([place])   . Kidney stones   . Osteoporosis   . H/O total ankle replacement, right 04/20/2015  . Carpal tunnel syndrome - right 07/09/2014  . Abdominal bloating 06/14/2014  . History of colonic polyps 06/14/2014  . Diabetic polyneuropathy associated with type 2 diabetes mellitus ([place]) 12/05/2012  . Lesion of ulnar nerve 12/05/2012  . Syncope 11/15/2012  .  Gout 11/15/2012  . Constipation 04/02/2009  . CYSTITIS, RECURRENT 12/02/2008  . COPD 10/26/2008  . DIVERTICULUM, BLADDER 10/11/2008  . INSOMNIA 07/24/2008  . ACUTE CYSTITIS 07/20/2008  . FATIGUE 07/20/2008  . DEPRESSION 02/27/2008  . CHEST PAIN UNSPECIFIED 02/27/2008  . NICOTINE ADDICTION 08/12/2007  . DYSLIPIDEMIA 07/22/2007  . OBESITY 07/22/2007  . ERECTILE DYSFUNCTION 07/22/2007  . HYPERTENSION 07/22/2007   Past Medical History:  Diagnosis Date  . Abdominal aortic aneurysm ([place])    pt denies-  . Anxiety   . Arthritis    ankle, hands , herniated disc L3-4  . CKD (chronic kidney disease)    pt denies  . COPD (chronic obstructive pulmonary disease) ([place])    normally have breathing tx prior to surgery  . Depression   . Diabetes mellitus without complication ([place])    borderline no med, controlled with diet  . Dyslipidemia   . Elevated WBCs September 2011   and UTI Hospitalized   . Emphysema of lung ([place])   . Erectile dysfunction   . Fatty liver   . Gait disorder   . Gastritis   . GERD (gastroesophageal reflux disease)   . Hiatal hernia   . History of colon polyps   . History of kidney stones   . History of kidney stones   . Hypertension   . Lesion of ulnar nerve 12/05/2012   Right side  . Lumbar spinal stenosis   . Lung cancer ([place])   . Neuropathy   . Nicotine addiction   . Obesity   . Osteoarthritis   . Osteoporosis   . Pneumonia    hx  . Polyneuropathy in other diseases classified elsewhere ([place]) 12/05/2012  . Shortness of breath dyspnea   . Vertigo   . Vitamin D deficiency   . Wears dentures    bottom    Family History  Problem Relation Age of Onset  . Cancer Father        Asbestos  . Colon cancer Neg Hx   . Colon polyps Neg Hx   . Kidney disease Neg Hx   . Esophageal cancer Neg Hx   . Gallbladder disease Neg Hx   . Heart disease Neg Hx   . Diabetes Neg Hx     Past Surgical History:  Procedure Laterality Date  . ANKLE ARTHROPLASTY Right  04/20/2015  . BLADDER SURGERY  02/2011   For Diverticulum of bladder removed  . CARPAL TUNNEL RELEASE Right 07/09/2014   Procedure: RIGHT OPEN CARPAL TUNNEL RELEASE;  Surgeon: Mcarthur Rossetti, MD;  [facility];  Service: Orthopedics;  Laterality: Right;  . CHOLECYSTECTOMY N/A 02/24/2014   Procedure: LAPAROSCOPIC CHOLECYSTECTOMY;  Surgeon: Coralie Keens, MD;  Location: [facility];  Service: General;  Laterality: N/A;  . colon polyp resection    . COLONOSCOPY    . CYSTO    . TOTAL ANKLE ARTHROPLASTY Right 04/20/2015   Procedure: TOTAL ANKLE ARTHOPLASTY;  Surgeon: Newt Minion, MD;  Location:  [facility];  Service: Orthopedics;  Laterality: Right;  [name] VIDEO ASSISTED THORACOSCOPY (VATS)/ LOBECTOMY Right 07/04/2015   Procedure: VIDEO ASSISTED THORACOSCOPY (VATS)/ RIGHT UPPER LOBECTOMY;  Surgeon: Melrose Nakayama, MD;  Location: [facility];  Service: Thoracic;  Laterality: Right;  [name] VIDEO BRONCHOSCOPY N/A 06/17/2015   Procedure: VIDEO BRONCHOSCOPY;  Surgeon: Melrose Nakayama, MD;  Location: [facility];  Service: Thoracic;  Laterality: N/A;  . VIDEO BRONCHOSCOPY WITH ENDOBRONCHIAL NAVIGATION N/A 06/17/2015   Procedure: VIDEO BRONCHOSCOPY WITH ENDOBRONCHIAL NAVIGATION;  Surgeon: Melrose Nakayama, MD;  Location: [facility];  Service: Thoracic;  Laterality: N/A;  . VIDEO BRONCHOSCOPY WITH ENDOBRONCHIAL ULTRASOUND N/A 06/17/2015   Procedure: VIDEO BRONCHOSCOPY WITH ENDOBRONCHIAL ULTRASOUND;  Surgeon: Melrose Nakayama, MD;  Location: [facility];  Service: Thoracic;  Laterality: N/A;   Social History   Occupational History  . retired    Social History Main Topics  . Smoking status: Former Smoker    Packs/day: 0.50    Years: 35.00    Types: Cigarettes    Quit date: 02/13/2015  . Smokeless tobacco: Never Used  . Alcohol use No  . Drug use: No  . Sexual activity: Not on file

## 2017-01-24 ENCOUNTER — Telehealth: Payer: Self-pay | Admitting: Radiation Oncology

## 2017-01-24 DIAGNOSIS — K449 Diaphragmatic hernia without obstruction or gangrene: Secondary | ICD-10-CM

## 2017-01-24 DIAGNOSIS — C3412 Malignant neoplasm of upper lobe, left bronchus or lung: Secondary | ICD-10-CM

## 2017-01-24 NOTE — Telephone Encounter (Signed)
I called the patient to see about getting back into the follow up schedule. He would like to have his scans in  and follow up with me the same day. He's interested in setting this up in the next two weeks.

## 2017-01-31 DIAGNOSIS — J189 Pneumonia, unspecified organism: Secondary | ICD-10-CM | POA: Diagnosis not present

## 2017-01-31 DIAGNOSIS — E1140 Type 2 diabetes mellitus with diabetic neuropathy, unspecified: Secondary | ICD-10-CM | POA: Diagnosis not present

## 2017-01-31 DIAGNOSIS — R531 Weakness: Secondary | ICD-10-CM | POA: Diagnosis not present

## 2017-01-31 DIAGNOSIS — J440 Chronic obstructive pulmonary disease with acute lower respiratory infection: Secondary | ICD-10-CM | POA: Diagnosis not present

## 2017-01-31 DIAGNOSIS — R1110 Vomiting, unspecified: Secondary | ICD-10-CM | POA: Diagnosis not present

## 2017-01-31 DIAGNOSIS — I10 Essential (primary) hypertension: Secondary | ICD-10-CM | POA: Diagnosis not present

## 2017-01-31 DIAGNOSIS — Z7984 Long term (current) use of oral hypoglycemic drugs: Secondary | ICD-10-CM | POA: Diagnosis not present

## 2017-01-31 DIAGNOSIS — K219 Gastro-esophageal reflux disease without esophagitis: Secondary | ICD-10-CM | POA: Diagnosis not present

## 2017-01-31 DIAGNOSIS — I6782 Cerebral ischemia: Secondary | ICD-10-CM | POA: Diagnosis not present

## 2017-01-31 DIAGNOSIS — Z79899 Other long term (current) drug therapy: Secondary | ICD-10-CM | POA: Diagnosis not present

## 2017-01-31 DIAGNOSIS — R918 Other nonspecific abnormal finding of lung field: Secondary | ICD-10-CM | POA: Diagnosis not present

## 2017-01-31 DIAGNOSIS — Z87891 Personal history of nicotine dependence: Secondary | ICD-10-CM | POA: Diagnosis not present

## 2017-02-07 ENCOUNTER — Ambulatory Visit (HOSPITAL_COMMUNITY)
Admission: RE | Admit: 2017-02-07 | Discharge: 2017-02-07 | Disposition: A | Discharge status: Home / Self Care | Payer: Medicare Other | Source: Ambulatory Visit | Attending: Radiation Oncology | Admitting: Radiation Oncology

## 2017-02-07 ENCOUNTER — Other Ambulatory Visit: Payer: Self-pay | Admitting: Radiation Oncology

## 2017-02-07 ENCOUNTER — Ambulatory Visit: Payer: Medicare Other

## 2017-02-07 DIAGNOSIS — M47814 Spondylosis without myelopathy or radiculopathy, thoracic region: Secondary | ICD-10-CM | POA: Diagnosis not present

## 2017-02-07 DIAGNOSIS — M5136 Other intervertebral disc degeneration, lumbar region: Secondary | ICD-10-CM | POA: Insufficient documentation

## 2017-02-07 DIAGNOSIS — N200 Calculus of kidney: Secondary | ICD-10-CM | POA: Diagnosis not present

## 2017-02-07 DIAGNOSIS — R918 Other nonspecific abnormal finding of lung field: Secondary | ICD-10-CM | POA: Diagnosis not present

## 2017-02-07 DIAGNOSIS — Z902 Acquired absence of lung [part of]: Secondary | ICD-10-CM | POA: Insufficient documentation

## 2017-02-07 DIAGNOSIS — M47816 Spondylosis without myelopathy or radiculopathy, lumbar region: Secondary | ICD-10-CM | POA: Diagnosis not present

## 2017-02-07 DIAGNOSIS — K449 Diaphragmatic hernia without obstruction or gangrene: Secondary | ICD-10-CM

## 2017-02-07 DIAGNOSIS — Z85118 Personal history of other malignant neoplasm of bronchus and lung: Secondary | ICD-10-CM | POA: Diagnosis not present

## 2017-02-07 DIAGNOSIS — N281 Cyst of kidney, acquired: Secondary | ICD-10-CM | POA: Insufficient documentation

## 2017-02-07 DIAGNOSIS — I714 Abdominal aortic aneurysm, without rupture: Secondary | ICD-10-CM | POA: Diagnosis not present

## 2017-02-07 DIAGNOSIS — I700 Atherosclerosis of aorta: Secondary | ICD-10-CM | POA: Diagnosis not present

## 2017-02-07 DIAGNOSIS — Z9049 Acquired absence of other specified parts of digestive tract: Secondary | ICD-10-CM | POA: Diagnosis not present

## 2017-02-07 DIAGNOSIS — I2510 Atherosclerotic heart disease of native coronary artery without angina pectoris: Secondary | ICD-10-CM | POA: Diagnosis not present

## 2017-02-07 DIAGNOSIS — C3412 Malignant neoplasm of upper lobe, left bronchus or lung: Secondary | ICD-10-CM

## 2017-02-07 DIAGNOSIS — Z923 Personal history of irradiation: Secondary | ICD-10-CM | POA: Insufficient documentation

## 2017-02-07 DIAGNOSIS — J439 Emphysema, unspecified: Secondary | ICD-10-CM | POA: Insufficient documentation

## 2017-02-07 DIAGNOSIS — C3411 Malignant neoplasm of upper lobe, right bronchus or lung: Secondary | ICD-10-CM | POA: Diagnosis not present

## 2017-02-07 DIAGNOSIS — K229 Disease of esophagus, unspecified: Secondary | ICD-10-CM | POA: Insufficient documentation

## 2017-02-07 MED ORDER — IOPAMIDOL (ISOVUE-300) INJECTION 61%
INTRAVENOUS | Status: AC
  Filled 2017-02-07: qty 100

## 2017-02-07 MED ORDER — IOPAMIDOL (ISOVUE-300) INJECTION 61%
100.0000 mL | Freq: Once | INTRAVENOUS | Status: AC | PRN
  Administered 2017-02-07: 100 mL via INTRAVENOUS

## 2017-02-11 ENCOUNTER — Other Ambulatory Visit: Payer: Self-pay | Admitting: Radiation Oncology

## 2017-02-11 ENCOUNTER — Telehealth: Payer: Self-pay | Admitting: Radiation Oncology

## 2017-02-11 DIAGNOSIS — C3412 Malignant neoplasm of upper lobe, left bronchus or lung: Secondary | ICD-10-CM

## 2017-02-11 DIAGNOSIS — K5901 Slow transit constipation: Secondary | ICD-10-CM | POA: Diagnosis not present

## 2017-02-11 DIAGNOSIS — M25571 Pain in right ankle and joints of right foot: Secondary | ICD-10-CM | POA: Diagnosis not present

## 2017-02-11 DIAGNOSIS — R1084 Generalized abdominal pain: Secondary | ICD-10-CM | POA: Insufficient documentation

## 2017-02-11 DIAGNOSIS — Z6828 Body mass index (BMI) 28.0-28.9, adult: Secondary | ICD-10-CM | POA: Diagnosis not present

## 2017-02-11 NOTE — Telephone Encounter (Signed)
LM for pt to call me back regarding scan results

## 2017-02-12 ENCOUNTER — Ambulatory Visit
Admission: RE | Admit: 2017-02-12 | Discharge: 2017-02-12 | Disposition: A | Discharge status: Home / Self Care | Payer: Medicare Other | Source: Ambulatory Visit | Attending: Radiation Oncology | Admitting: Radiation Oncology

## 2017-02-21 ENCOUNTER — Telehealth: Payer: Self-pay | Admitting: Radiation Oncology

## 2017-02-21 NOTE — Telephone Encounter (Signed)
I called the patient to review his CT scan and that he needed to follow up with his PCP to have his PSA checked and to make sure they knew about his AAA. He will follow up with me in the spring for his next CT scan.

## 2017-02-26 DIAGNOSIS — M79671 Pain in right foot: Secondary | ICD-10-CM | POA: Diagnosis not present

## 2017-02-26 DIAGNOSIS — M79672 Pain in left foot: Secondary | ICD-10-CM | POA: Diagnosis not present

## 2017-02-26 DIAGNOSIS — E1351 Other specified diabetes mellitus with diabetic peripheral angiopathy without gangrene: Secondary | ICD-10-CM | POA: Diagnosis not present

## 2017-02-26 DIAGNOSIS — B351 Tinea unguium: Secondary | ICD-10-CM | POA: Diagnosis not present

## 2017-02-26 DIAGNOSIS — G609 Hereditary and idiopathic neuropathy, unspecified: Secondary | ICD-10-CM | POA: Diagnosis not present

## 2017-03-12 DIAGNOSIS — R972 Elevated prostate specific antigen [PSA]: Secondary | ICD-10-CM | POA: Diagnosis not present

## 2017-03-12 DIAGNOSIS — E0865 Diabetes mellitus due to underlying condition with hyperglycemia: Secondary | ICD-10-CM | POA: Diagnosis not present

## 2017-03-12 DIAGNOSIS — R413 Other amnesia: Secondary | ICD-10-CM | POA: Diagnosis not present

## 2017-03-12 DIAGNOSIS — Z0001 Encounter for general adult medical examination with abnormal findings: Secondary | ICD-10-CM | POA: Diagnosis not present

## 2017-03-12 DIAGNOSIS — I10 Essential (primary) hypertension: Secondary | ICD-10-CM | POA: Diagnosis not present

## 2017-03-12 DIAGNOSIS — K5901 Slow transit constipation: Secondary | ICD-10-CM | POA: Diagnosis not present

## 2017-03-12 DIAGNOSIS — M25571 Pain in right ankle and joints of right foot: Secondary | ICD-10-CM | POA: Diagnosis not present

## 2017-03-12 DIAGNOSIS — Z6829 Body mass index (BMI) 29.0-29.9, adult: Secondary | ICD-10-CM | POA: Diagnosis not present

## 2017-03-12 DIAGNOSIS — Z125 Encounter for screening for malignant neoplasm of prostate: Secondary | ICD-10-CM | POA: Diagnosis not present

## 2017-03-13 DIAGNOSIS — R972 Elevated prostate specific antigen [PSA]: Secondary | ICD-10-CM | POA: Insufficient documentation

## 2017-04-15 DIAGNOSIS — I129 Hypertensive chronic kidney disease with stage 1 through stage 4 chronic kidney disease, or unspecified chronic kidney disease: Secondary | ICD-10-CM | POA: Diagnosis not present

## 2017-04-15 DIAGNOSIS — Z86010 Personal history of colonic polyps: Secondary | ICD-10-CM | POA: Diagnosis not present

## 2017-04-15 DIAGNOSIS — Z884 Allergy status to anesthetic agent status: Secondary | ICD-10-CM | POA: Diagnosis not present

## 2017-04-15 DIAGNOSIS — E1122 Type 2 diabetes mellitus with diabetic chronic kidney disease: Secondary | ICD-10-CM | POA: Diagnosis not present

## 2017-04-15 DIAGNOSIS — K219 Gastro-esophageal reflux disease without esophagitis: Secondary | ICD-10-CM | POA: Diagnosis not present

## 2017-04-15 DIAGNOSIS — J449 Chronic obstructive pulmonary disease, unspecified: Secondary | ICD-10-CM | POA: Diagnosis not present

## 2017-04-15 DIAGNOSIS — Z87442 Personal history of urinary calculi: Secondary | ICD-10-CM | POA: Diagnosis not present

## 2017-04-15 DIAGNOSIS — M1990 Unspecified osteoarthritis, unspecified site: Secondary | ICD-10-CM | POA: Diagnosis not present

## 2017-04-15 DIAGNOSIS — Z9049 Acquired absence of other specified parts of digestive tract: Secondary | ICD-10-CM | POA: Diagnosis not present

## 2017-04-15 DIAGNOSIS — Z85118 Personal history of other malignant neoplasm of bronchus and lung: Secondary | ICD-10-CM | POA: Diagnosis not present

## 2017-04-15 DIAGNOSIS — N189 Chronic kidney disease, unspecified: Secondary | ICD-10-CM | POA: Diagnosis not present

## 2017-04-15 DIAGNOSIS — Z79899 Other long term (current) drug therapy: Secondary | ICD-10-CM | POA: Diagnosis not present

## 2017-04-15 DIAGNOSIS — G629 Polyneuropathy, unspecified: Secondary | ICD-10-CM | POA: Diagnosis not present

## 2017-04-15 DIAGNOSIS — Z7982 Long term (current) use of aspirin: Secondary | ICD-10-CM | POA: Diagnosis not present

## 2017-04-15 DIAGNOSIS — Z91018 Allergy to other foods: Secondary | ICD-10-CM | POA: Diagnosis not present

## 2017-04-15 DIAGNOSIS — Z881 Allergy status to other antibiotic agents status: Secondary | ICD-10-CM | POA: Diagnosis not present

## 2017-04-15 DIAGNOSIS — Z7951 Long term (current) use of inhaled steroids: Secondary | ICD-10-CM | POA: Diagnosis not present

## 2017-04-15 DIAGNOSIS — Z888 Allergy status to other drugs, medicaments and biological substances status: Secondary | ICD-10-CM | POA: Diagnosis not present

## 2017-04-15 DIAGNOSIS — Z1211 Encounter for screening for malignant neoplasm of colon: Secondary | ICD-10-CM | POA: Diagnosis not present

## 2017-04-18 ENCOUNTER — Ambulatory Visit (INDEPENDENT_AMBULATORY_CARE_PROVIDER_SITE_OTHER): Payer: Medicare Other | Admitting: Orthopedic Surgery

## 2017-04-18 ENCOUNTER — Encounter (INDEPENDENT_AMBULATORY_CARE_PROVIDER_SITE_OTHER): Payer: Self-pay | Admitting: Orthopedic Surgery

## 2017-04-18 DIAGNOSIS — E1142 Type 2 diabetes mellitus with diabetic polyneuropathy: Secondary | ICD-10-CM | POA: Diagnosis not present

## 2017-04-18 DIAGNOSIS — Z96661 Presence of right artificial ankle joint: Secondary | ICD-10-CM | POA: Diagnosis not present

## 2017-04-18 DIAGNOSIS — M19071 Primary osteoarthritis, right ankle and foot: Secondary | ICD-10-CM | POA: Diagnosis not present

## 2017-04-18 NOTE — Progress Notes (Signed)
Office Visit Note   Patient: Jonathan [name]           Date of Birth: 1941/02/10           MRN: [id number] Visit Date: 04/18/2017              Requested by: Celedonio Savage, MD [address] [place], [place] [address] PCP: Celedonio Savage, MD  Chief Complaint  Patient presents with  . Right Ankle - Routine Post Op    Right Total Ankle Replacement. 04/20/15      HPI: Patient presents in follow-up for right total ankle arthroplasty he states he started to develop some pain in the subtalar joint on the right.  He states it is worse with bad weather in the cold.  He states he has been given an ankle brace [facility] but he is not wearing it all the time.  Assessment & Plan: Visit Diagnoses:  1. Diabetic polyneuropathy associated with type 2 diabetes mellitus ([place])   2. H/O total ankle replacement, right   3. Arthritis of right subtalar joint     Plan: Recommend that he wear the ankle stabilizing orthosis as much as possible when he is symptomatic.  Will reevaluate in 3 months.  Patient states he is just completed testing with podiatry and he was positive for neuropathy.  Follow-Up Instructions: Return in about 3 months (around 07/17/2017).   Ortho Exam  Patient is alert, oriented, no adenopathy, well-dressed, normal affect, normal respiratory effort. Examination patient has a good dorsalis pedis pulse he has an antalgic gait he has good range of motion of the ankle and subtalar joint.  He has good pulses he is tender to palpation over the sinus Tarsi consistent with subtalar pain.  Ankle joint is nontender to palpation there is no redness no cellulitis no signs of infection.  Imaging: No results found. No images are attached to the encounter.  Labs: Lab Results  Component Value Date   HGBA1C 6.3 (H) 06/13/2015   HGBA1C 6.6 (H) 04/18/2015   HGBA1C 6.0 (H) 11/15/2012   REPTSTATUS 07/29/2015 FINAL 07/27/2015   GRAMSTAIN  06/17/2015    NO WBC SEEN NO SQUAMOUS  EPITHELIAL CELLS SEEN NO ORGANISMS SEEN Performed at [facility], SUGGEST RECOLLECTION (A) 07/27/2015   LABORGA STAPHYLOCOCCUS SPECIES (COAGULASE NEGATIVE) 02/24/2007    @LABSALLVALUES (HGBA1)@  There is no height or weight on file to calculate BMI.  Orders:  No orders of the defined types were placed in this encounter.  No orders of the defined types were placed in this encounter.    Procedures: No procedures performed  Clinical Data: No additional findings.  ROS:  All other systems negative, except as noted in the HPI. Review of Systems  Objective: Vital Signs: There were no vitals taken for this visit.  Specialty Comments:  No specialty comments available.  PMFS History: Patient Active Problem List   Diagnosis Date Noted  . Arthritis of right subtalar joint 04/18/2017  . 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 Idiopathic chronic venous hypertension of both lower extremities with inflammation 05/22/2016  . Fibrosis of subtalar joint, right 03/08/2016  . Primary malignant neoplasm of left upper lobe of lung ([place]) 09/21/2015  . Lung cancer ([place]) 07/04/2015  . Diabetes mellitus type II, controlled ([place]) 06/01/2015  . 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 Neoplasm of uncertain behavior of left  upper lobe of lung 05/31/2015  . Emphysema of lung ([place])   . Kidney stones   . Osteoporosis   . H/O total ankle replacement, right 04/20/2015  . Carpal tunnel syndrome - right 07/09/2014  . Abdominal bloating 06/14/2014  . History of colonic polyps 06/14/2014  . Diabetic polyneuropathy associated with type 2 diabetes mellitus ([place]) 12/05/2012  . Lesion of ulnar nerve 12/05/2012  . Syncope 11/15/2012  . Gout 11/15/2012  . Constipation 04/02/2009  . CYSTITIS, RECURRENT 12/02/2008  . COPD 10/26/2008  . DIVERTICULUM, BLADDER 10/11/2008  . INSOMNIA 07/24/2008  . ACUTE  CYSTITIS 07/20/2008  . FATIGUE 07/20/2008  . DEPRESSION 02/27/2008  . CHEST PAIN UNSPECIFIED 02/27/2008  . NICOTINE ADDICTION 08/12/2007  . DYSLIPIDEMIA 07/22/2007  . OBESITY 07/22/2007  . ERECTILE DYSFUNCTION 07/22/2007  . HYPERTENSION 07/22/2007   Past Medical History:  Diagnosis Date  . Abdominal aortic aneurysm ([place])    pt denies-  . Anxiety   . Arthritis    ankle, hands , herniated disc L3-4  . CKD (chronic kidney disease)    pt denies  . COPD (chronic obstructive pulmonary disease) ([place])    normally have breathing tx prior to surgery  . Depression   . Diabetes mellitus without complication ([place])    borderline no med, controlled with diet  . Dyslipidemia   . Elevated WBCs September 2011   and UTI Hospitalized   . Emphysema of lung ([place])   . Erectile dysfunction   . Fatty liver   . Gait disorder   . Gastritis   . GERD (gastroesophageal reflux disease)   . Hiatal hernia   . History of colon polyps   . History of kidney stones   . History of kidney stones   . Hypertension   . Lesion of ulnar nerve 12/05/2012   Right side  . Lumbar spinal stenosis   . Lung cancer ([place])   . Neuropathy   . Nicotine addiction   . Obesity   . Osteoarthritis   . Osteoporosis   . Pneumonia    hx  . Polyneuropathy in other diseases classified elsewhere ([place]) 12/05/2012  . Shortness of breath dyspnea   . Vertigo   . Vitamin D deficiency   . Wears dentures    bottom    Family History  Problem Relation Age of Onset  . Cancer Father        Asbestos  . Colon cancer Neg Hx   . Colon polyps Neg Hx   . Kidney disease Neg Hx   . Esophageal cancer Neg Hx   . Gallbladder disease Neg Hx   . Heart disease Neg Hx   . Diabetes Neg Hx     Past Surgical History:  Procedure Laterality Date  . ANKLE ARTHROPLASTY Right 04/20/2015  . BLADDER SURGERY  02/2011   For Diverticulum of bladder removed  . CARPAL TUNNEL RELEASE Right 07/09/2014   Procedure: RIGHT OPEN CARPAL TUNNEL RELEASE;   Surgeon: Mcarthur Rossetti, MD;  Location: [facility];  Service: Orthopedics;  Laterality: Right;  . CHOLECYSTECTOMY N/A 02/24/2014   Procedure: LAPAROSCOPIC CHOLECYSTECTOMY;  Surgeon: Coralie Keens, MD;  Location: [facility];  Service: General;  Laterality: N/A;  . colon polyp resection    . COLONOSCOPY    . CYSTO    . TOTAL ANKLE ARTHROPLASTY Right 04/20/2015   Procedure: TOTAL ANKLE ARTHOPLASTY;  Surgeon: Newt Minion, MD;  Location: [facility];  Service: Orthopedics;  Laterality: Right;  [name] VIDEO ASSISTED THORACOSCOPY (VATS)/  LOBECTOMY Right 07/04/2015   Procedure: VIDEO ASSISTED THORACOSCOPY (VATS)/ RIGHT UPPER LOBECTOMY;  Surgeon: Melrose Nakayama, MD;  Location: [facility];  Service: Thoracic;  Laterality: Right;  [name] VIDEO BRONCHOSCOPY N/A 06/17/2015   Procedure: VIDEO BRONCHOSCOPY;  Surgeon: Melrose Nakayama, MD;  Location: [facility];  Service: Thoracic;  Laterality: N/A;  . VIDEO BRONCHOSCOPY WITH ENDOBRONCHIAL NAVIGATION N/A 06/17/2015   Procedure: VIDEO BRONCHOSCOPY WITH ENDOBRONCHIAL NAVIGATION;  Surgeon: Melrose Nakayama, MD;  Location: [facility];  Service: Thoracic;  Laterality: N/A;  . VIDEO BRONCHOSCOPY WITH ENDOBRONCHIAL ULTRASOUND N/A 06/17/2015   Procedure: VIDEO BRONCHOSCOPY WITH ENDOBRONCHIAL ULTRASOUND;  Surgeon: Melrose Nakayama, MD;  Location: [facility];  Service: Thoracic;  Laterality: N/A;   Social History   Occupational History  . Occupation: retired  Tobacco Use  . Smoking status: Former Smoker    Packs/day: 0.50    Years: 35.00    Pack years: 17.50    Types: Cigarettes    Last attempt to quit: 02/13/2015    Years since quitting: 2.1  . Smokeless tobacco: Never Used  Substance and Sexual Activity  . Alcohol use: No    Alcohol/week: 0.0 oz  . Drug use: No  . Sexual activity: Not on file

## 2017-05-07 DIAGNOSIS — M79671 Pain in right foot: Secondary | ICD-10-CM | POA: Diagnosis not present

## 2017-05-07 DIAGNOSIS — B351 Tinea unguium: Secondary | ICD-10-CM | POA: Diagnosis not present

## 2017-05-07 DIAGNOSIS — M19071 Primary osteoarthritis, right ankle and foot: Secondary | ICD-10-CM | POA: Diagnosis not present

## 2017-05-07 DIAGNOSIS — E1142 Type 2 diabetes mellitus with diabetic polyneuropathy: Secondary | ICD-10-CM | POA: Diagnosis not present

## 2017-05-07 DIAGNOSIS — M79672 Pain in left foot: Secondary | ICD-10-CM | POA: Diagnosis not present

## 2017-05-07 DIAGNOSIS — E1351 Other specified diabetes mellitus with diabetic peripheral angiopathy without gangrene: Secondary | ICD-10-CM | POA: Diagnosis not present

## 2017-05-10 ENCOUNTER — Ambulatory Visit (INDEPENDENT_AMBULATORY_CARE_PROVIDER_SITE_OTHER): Payer: Medicare Other | Admitting: Urology

## 2017-05-10 ENCOUNTER — Other Ambulatory Visit (HOSPITAL_COMMUNITY)
Admission: RE | Admit: 2017-05-10 | Discharge: 2017-05-10 | Disposition: A | Discharge status: Home / Self Care | Payer: Medicare Other | Source: Other Acute Inpatient Hospital | Attending: Urology | Admitting: Urology

## 2017-05-10 DIAGNOSIS — N403 Nodular prostate with lower urinary tract symptoms: Secondary | ICD-10-CM | POA: Diagnosis not present

## 2017-05-10 DIAGNOSIS — N390 Urinary tract infection, site not specified: Secondary | ICD-10-CM | POA: Insufficient documentation

## 2017-05-10 DIAGNOSIS — R972 Elevated prostate specific antigen [PSA]: Secondary | ICD-10-CM

## 2017-05-10 DIAGNOSIS — R3914 Feeling of incomplete bladder emptying: Secondary | ICD-10-CM

## 2017-05-10 LAB — URINALYSIS, COMPLETE (UACMP) WITH MICROSCOPIC
Bilirubin Urine: NEGATIVE
Glucose, UA: NEGATIVE mg/dL
Hgb urine dipstick: NEGATIVE
Ketones, ur: NEGATIVE mg/dL
Nitrite: POSITIVE — AB
Protein, ur: NEGATIVE mg/dL
Specific Gravity, Urine: 1.02 (ref 1.005–1.030)
pH: 7 (ref 5.0–8.0)

## 2017-05-12 LAB — URINE CULTURE

## 2017-05-13 ENCOUNTER — Other Ambulatory Visit: Payer: Self-pay | Admitting: Urology

## 2017-05-13 DIAGNOSIS — R972 Elevated prostate specific antigen [PSA]: Secondary | ICD-10-CM

## 2017-05-20 ENCOUNTER — Other Ambulatory Visit (HOSPITAL_COMMUNITY)
Admission: RE | Admit: 2017-05-20 | Discharge: 2017-05-20 | Disposition: A | Discharge status: Home / Self Care | Payer: Medicare Other | Source: Ambulatory Visit | Attending: Urology | Admitting: Urology

## 2017-05-20 DIAGNOSIS — R3914 Feeling of incomplete bladder emptying: Secondary | ICD-10-CM | POA: Diagnosis not present

## 2017-05-20 DIAGNOSIS — R972 Elevated prostate specific antigen [PSA]: Secondary | ICD-10-CM | POA: Insufficient documentation

## 2017-05-20 DIAGNOSIS — I10 Essential (primary) hypertension: Secondary | ICD-10-CM | POA: Diagnosis not present

## 2017-05-20 DIAGNOSIS — M48061 Spinal stenosis, lumbar region without neurogenic claudication: Secondary | ICD-10-CM | POA: Diagnosis not present

## 2017-05-20 DIAGNOSIS — Z6829 Body mass index (BMI) 29.0-29.9, adult: Secondary | ICD-10-CM | POA: Diagnosis not present

## 2017-05-20 LAB — URINALYSIS, ROUTINE W REFLEX MICROSCOPIC
Bilirubin Urine: NEGATIVE
Glucose, UA: NEGATIVE mg/dL
Hgb urine dipstick: NEGATIVE
Ketones, ur: NEGATIVE mg/dL
Leukocytes, UA: NEGATIVE
Nitrite: NEGATIVE
Protein, ur: NEGATIVE mg/dL
Specific Gravity, Urine: 1.016 (ref 1.005–1.030)
pH: 5 (ref 5.0–8.0)

## 2017-05-20 LAB — PSA: Prostatic Specific Antigen: 22.41 ng/mL — ABNORMAL HIGH (ref 0.00–4.00)

## 2017-05-21 LAB — URINE CULTURE: Culture: NO GROWTH

## 2017-05-21 LAB — TESTOSTERONE: Testosterone: 255 ng/dL — ABNORMAL LOW (ref 264–916)

## 2017-05-24 ENCOUNTER — Ambulatory Visit (HOSPITAL_COMMUNITY)
Admission: RE | Admit: 2017-05-24 | Discharge: 2017-05-24 | Disposition: A | Discharge status: Home / Self Care | Payer: Medicare Other | Source: Ambulatory Visit | Attending: Urology | Admitting: Urology

## 2017-05-24 DIAGNOSIS — R972 Elevated prostate specific antigen [PSA]: Secondary | ICD-10-CM | POA: Insufficient documentation

## 2017-05-24 DIAGNOSIS — C61 Malignant neoplasm of prostate: Secondary | ICD-10-CM | POA: Diagnosis not present

## 2017-05-24 DIAGNOSIS — N403 Nodular prostate with lower urinary tract symptoms: Secondary | ICD-10-CM | POA: Diagnosis not present

## 2017-05-24 MED ORDER — LIDOCAINE HCL (PF) 1 % IJ SOLN
INTRAMUSCULAR | Status: AC
  Administered 2017-05-24: 2.1 mL
  Filled 2017-05-24: qty 5

## 2017-05-24 MED ORDER — CEFTRIAXONE SODIUM 1 G IJ SOLR
INTRAMUSCULAR | Status: AC
  Administered 2017-05-24: 1 g via INTRAMUSCULAR
  Filled 2017-05-24: qty 10

## 2017-05-24 MED ORDER — CEFTRIAXONE SODIUM 1 G IJ SOLR
1.0000 g | Freq: Once | INTRAMUSCULAR | Status: AC
  Administered 2017-05-24: 1 g via INTRAMUSCULAR

## 2017-05-24 MED ORDER — LIDOCAINE HCL (PF) 2 % IJ SOLN
INTRAMUSCULAR | Status: AC
  Administered 2017-05-24: 10 mL
  Filled 2017-05-24: qty 10

## 2017-05-24 NOTE — Discharge Instructions (Signed)
Transrectal Ultrasound-Guided Biopsy A transrectal ultrasound-guided biopsy is a procedure to remove samples of tissue from your prostate using ultrasound images to guide the procedure. The procedure is usually done to evaluate the prostate gland of men who have an elevated prostate-specific antigen (PSA). PSA is a blood test to screen for prostate cancer. The biopsy samples are taken to check for prostate cancer. Tell a health care provider about:  Any allergies you have.  All medicines you are taking, including vitamins, herbs, eye drops, creams, and over-the-counter medicines.  Any problems you or family members have had with anesthetic medicines.  Any blood disorders you have.  Any surgeries you have had.  Any medical conditions you have. What are the risks? Generally, this is a safe procedure. However, as with any procedure, problems can occur. Possible problems include:  Infection of your prostate.  Bleeding from your rectum or blood in your urine.  Difficulty urinating.  Nerve damage (this is usually temporary).  Damage to surrounding structures such as blood vessels, organs, and muscles, which would require other procedures.  What happens before the procedure?  Do not eat or drink anything after midnight on the night before the procedure or as directed by your health care provider.  Take medicines only as directed by your health care provider.  Your health care provider may have you stop taking certain medicines 5-7 days before the procedure.  You will be given an enema before the procedure. During an enema, a liquid is injected into your rectum to clear out waste.  You may have lab tests the day of your procedure.  Plan to have someone take you home after the procedure. What happens during the procedure?  You will be given medicine to help you relax (sedative) before the procedure. An IV tube will be inserted into one of your veins and used to give fluids and  medicine.  You will be given antibiotic medicine to reduce the risk of an infection.  You will be placed on your side for the procedure.  A probe with lubricated gel will be placed into your rectum, and images will be taken of your prostate and surrounding structures.  Numbing medicine will be injected into the prostate before the biopsy samples are taken.  A biopsy needle will then be inserted and guided to your prostate with the use of the ultrasound images.  Samples of prostate tissue will be taken, and the needle will then be removed.  The biopsy samples will be sent to a lab to be analyzed. Results are usually back in 2-3 days. What happens after the procedure?  You will be taken to a recovery area where you will be monitored.  You may have some discomfort in the rectal area. You will be given pain medicines to control this.  You may be allowed to go home the same day, or you may need to stay in the hospital overnight. This information is not intended to replace advice given to you by your health care provider. Make sure you discuss any questions you have with your health care provider. Document Released: 07/27/2013 Document Revised: 08/18/2015 Document Reviewed: 10/29/2012 Elsevier Interactive Patient Education  [employer].

## 2017-05-27 DIAGNOSIS — M48061 Spinal stenosis, lumbar region without neurogenic claudication: Secondary | ICD-10-CM | POA: Diagnosis not present

## 2017-05-27 DIAGNOSIS — M5126 Other intervertebral disc displacement, lumbar region: Secondary | ICD-10-CM | POA: Diagnosis not present

## 2017-05-27 DIAGNOSIS — M47816 Spondylosis without myelopathy or radiculopathy, lumbar region: Secondary | ICD-10-CM | POA: Diagnosis not present

## 2017-05-27 DIAGNOSIS — M5127 Other intervertebral disc displacement, lumbosacral region: Secondary | ICD-10-CM | POA: Diagnosis not present

## 2017-05-27 DIAGNOSIS — M7138 Other bursal cyst, other site: Secondary | ICD-10-CM | POA: Diagnosis not present

## 2017-05-30 DIAGNOSIS — I10 Essential (primary) hypertension: Secondary | ICD-10-CM | POA: Diagnosis not present

## 2017-05-30 DIAGNOSIS — Z6828 Body mass index (BMI) 28.0-28.9, adult: Secondary | ICD-10-CM | POA: Diagnosis not present

## 2017-05-30 DIAGNOSIS — M48061 Spinal stenosis, lumbar region without neurogenic claudication: Secondary | ICD-10-CM | POA: Diagnosis not present

## 2017-06-03 DIAGNOSIS — R531 Weakness: Secondary | ICD-10-CM | POA: Diagnosis not present

## 2017-06-03 DIAGNOSIS — K5901 Slow transit constipation: Secondary | ICD-10-CM | POA: Diagnosis not present

## 2017-06-03 DIAGNOSIS — R413 Other amnesia: Secondary | ICD-10-CM | POA: Diagnosis not present

## 2017-06-03 DIAGNOSIS — I10 Essential (primary) hypertension: Secondary | ICD-10-CM | POA: Diagnosis not present

## 2017-06-03 DIAGNOSIS — E1143 Type 2 diabetes mellitus with diabetic autonomic (poly)neuropathy: Secondary | ICD-10-CM | POA: Diagnosis not present

## 2017-06-03 DIAGNOSIS — M25571 Pain in right ankle and joints of right foot: Secondary | ICD-10-CM | POA: Diagnosis not present

## 2017-06-03 DIAGNOSIS — E0865 Diabetes mellitus due to underlying condition with hyperglycemia: Secondary | ICD-10-CM | POA: Diagnosis not present

## 2017-06-03 DIAGNOSIS — Z6828 Body mass index (BMI) 28.0-28.9, adult: Secondary | ICD-10-CM | POA: Diagnosis not present

## 2017-06-06 ENCOUNTER — Other Ambulatory Visit: Payer: Self-pay | Admitting: Urology

## 2017-06-06 DIAGNOSIS — C61 Malignant neoplasm of prostate: Secondary | ICD-10-CM

## 2017-06-11 ENCOUNTER — Encounter (HOSPITAL_COMMUNITY)
Admission: RE | Admit: 2017-06-11 | Discharge: 2017-06-11 | Disposition: A | Discharge status: Home / Self Care | Payer: Medicare Other | Source: Ambulatory Visit | Attending: Urology | Admitting: Urology

## 2017-06-11 ENCOUNTER — Encounter (HOSPITAL_COMMUNITY): Payer: Self-pay

## 2017-06-11 ENCOUNTER — Ambulatory Visit (HOSPITAL_COMMUNITY)
Admission: RE | Admit: 2017-06-11 | Discharge: 2017-06-11 | Disposition: A | Discharge status: Home / Self Care | Payer: Medicare Other | Source: Ambulatory Visit | Attending: Urology | Admitting: Urology

## 2017-06-11 DIAGNOSIS — I714 Abdominal aortic aneurysm, without rupture: Secondary | ICD-10-CM | POA: Insufficient documentation

## 2017-06-11 DIAGNOSIS — C61 Malignant neoplasm of prostate: Secondary | ICD-10-CM | POA: Diagnosis not present

## 2017-06-11 DIAGNOSIS — I700 Atherosclerosis of aorta: Secondary | ICD-10-CM | POA: Insufficient documentation

## 2017-06-11 DIAGNOSIS — N281 Cyst of kidney, acquired: Secondary | ICD-10-CM | POA: Insufficient documentation

## 2017-06-11 LAB — POCT I-STAT CREATININE: Creatinine, Ser: 1.3 mg/dL — ABNORMAL HIGH (ref 0.61–1.24)

## 2017-06-11 MED ORDER — IOPAMIDOL (ISOVUE-300) INJECTION 61%
100.0000 mL | Freq: Once | INTRAVENOUS | Status: AC | PRN
  Administered 2017-06-11: 100 mL via INTRAVENOUS

## 2017-06-11 MED ORDER — TECHNETIUM TC 99M MEDRONATE IV KIT
25.0000 | PACK | Freq: Once | INTRAVENOUS | Status: AC | PRN
  Administered 2017-06-11: 20.1 via INTRAVENOUS

## 2017-06-12 DIAGNOSIS — N5201 Erectile dysfunction due to arterial insufficiency: Secondary | ICD-10-CM | POA: Diagnosis not present

## 2017-06-12 DIAGNOSIS — C61 Malignant neoplasm of prostate: Secondary | ICD-10-CM | POA: Diagnosis not present

## 2017-06-14 ENCOUNTER — Encounter: Payer: Self-pay | Admitting: Radiation Oncology

## 2017-06-26 ENCOUNTER — Ambulatory Visit (INDEPENDENT_AMBULATORY_CARE_PROVIDER_SITE_OTHER): Payer: Medicare Other | Admitting: Urology

## 2017-06-26 DIAGNOSIS — C61 Malignant neoplasm of prostate: Secondary | ICD-10-CM

## 2017-06-26 DIAGNOSIS — M1990 Unspecified osteoarthritis, unspecified site: Secondary | ICD-10-CM | POA: Diagnosis not present

## 2017-06-26 DIAGNOSIS — R030 Elevated blood-pressure reading, without diagnosis of hypertension: Secondary | ICD-10-CM | POA: Diagnosis not present

## 2017-06-26 DIAGNOSIS — Z8249 Family history of ischemic heart disease and other diseases of the circulatory system: Secondary | ICD-10-CM | POA: Diagnosis not present

## 2017-06-26 DIAGNOSIS — R600 Localized edema: Secondary | ICD-10-CM | POA: Diagnosis not present

## 2017-06-26 DIAGNOSIS — E1140 Type 2 diabetes mellitus with diabetic neuropathy, unspecified: Secondary | ICD-10-CM | POA: Diagnosis not present

## 2017-06-26 DIAGNOSIS — R229 Localized swelling, mass and lump, unspecified: Secondary | ICD-10-CM | POA: Diagnosis not present

## 2017-06-26 DIAGNOSIS — I8003 Phlebitis and thrombophlebitis of superficial vessels of lower extremities, bilateral: Secondary | ICD-10-CM | POA: Diagnosis not present

## 2017-06-26 DIAGNOSIS — J449 Chronic obstructive pulmonary disease, unspecified: Secondary | ICD-10-CM | POA: Diagnosis not present

## 2017-06-26 DIAGNOSIS — I10 Essential (primary) hypertension: Secondary | ICD-10-CM | POA: Diagnosis not present

## 2017-06-26 DIAGNOSIS — Z79899 Other long term (current) drug therapy: Secondary | ICD-10-CM | POA: Diagnosis not present

## 2017-06-26 DIAGNOSIS — Z85118 Personal history of other malignant neoplasm of bronchus and lung: Secondary | ICD-10-CM | POA: Diagnosis not present

## 2017-06-26 DIAGNOSIS — R609 Edema, unspecified: Secondary | ICD-10-CM | POA: Diagnosis not present

## 2017-06-26 DIAGNOSIS — Z87891 Personal history of nicotine dependence: Secondary | ICD-10-CM | POA: Diagnosis not present

## 2017-06-26 DIAGNOSIS — R918 Other nonspecific abnormal finding of lung field: Secondary | ICD-10-CM | POA: Diagnosis not present

## 2017-06-26 DIAGNOSIS — K219 Gastro-esophageal reflux disease without esophagitis: Secondary | ICD-10-CM | POA: Diagnosis not present

## 2017-06-26 DIAGNOSIS — M7989 Other specified soft tissue disorders: Secondary | ICD-10-CM | POA: Diagnosis not present

## 2017-06-28 DIAGNOSIS — Z859 Personal history of malignant neoplasm, unspecified: Secondary | ICD-10-CM | POA: Diagnosis not present

## 2017-06-28 DIAGNOSIS — I82813 Embolism and thrombosis of superficial veins of lower extremities, bilateral: Secondary | ICD-10-CM | POA: Diagnosis not present

## 2017-06-28 DIAGNOSIS — R600 Localized edema: Secondary | ICD-10-CM | POA: Diagnosis not present

## 2017-06-28 DIAGNOSIS — R609 Edema, unspecified: Secondary | ICD-10-CM | POA: Diagnosis not present

## 2017-07-08 DIAGNOSIS — E0865 Diabetes mellitus due to underlying condition with hyperglycemia: Secondary | ICD-10-CM | POA: Diagnosis not present

## 2017-07-08 DIAGNOSIS — K5901 Slow transit constipation: Secondary | ICD-10-CM | POA: Diagnosis not present

## 2017-07-08 DIAGNOSIS — Z6828 Body mass index (BMI) 28.0-28.9, adult: Secondary | ICD-10-CM | POA: Diagnosis not present

## 2017-07-08 DIAGNOSIS — E1143 Type 2 diabetes mellitus with diabetic autonomic (poly)neuropathy: Secondary | ICD-10-CM | POA: Diagnosis not present

## 2017-07-08 DIAGNOSIS — M25571 Pain in right ankle and joints of right foot: Secondary | ICD-10-CM | POA: Diagnosis not present

## 2017-07-08 DIAGNOSIS — I10 Essential (primary) hypertension: Secondary | ICD-10-CM | POA: Diagnosis not present

## 2017-07-08 DIAGNOSIS — R531 Weakness: Secondary | ICD-10-CM | POA: Diagnosis not present

## 2017-07-08 DIAGNOSIS — C61 Malignant neoplasm of prostate: Secondary | ICD-10-CM | POA: Diagnosis not present

## 2017-07-10 ENCOUNTER — Ambulatory Visit: Payer: Medicare Other

## 2017-07-10 ENCOUNTER — Ambulatory Visit
Admission: RE | Admit: 2017-07-10 | Discharge: 2017-07-10 | Disposition: A | Discharge status: Home / Self Care | Payer: Medicare Other | Source: Ambulatory Visit | Attending: Urology | Admitting: Urology

## 2017-07-10 ENCOUNTER — Encounter: Payer: Self-pay | Admitting: *Deleted

## 2017-07-10 DIAGNOSIS — C61 Malignant neoplasm of prostate: Secondary | ICD-10-CM | POA: Insufficient documentation

## 2017-07-10 NOTE — Progress Notes (Signed)
[place] with Mr. Wieck he "tated he was not aware of any appointment with Dr. Tammi Klippel for today no one called me about an appointment and what was the appointment about.  I am supposed to go to [facility] on 07-26-17. Explained the appointment was to talk about his prostate cancer with Dr. Tammi Klippel and his PA-C Ashlyn Bruning to review the options of treatment. Again Mr. Hagood repeated you call Dr. Ralene Muskrat nurse because I have been working with that office and I do not have an appointment with Dr. Tammi Klippel. I explained that I would call Dr. Ralene Muskrat office also I would let Dr. Tammi Klippel know he would not be here today and we can get him re-scheduled at a later date to see Dr. Tammi Klippel. [place] PA-C made aware of the above situation and will share with Dr. Tammi Klippel. 14 Call Dr. Ralene Muskrat office spoke with Mickel Baas explained the above situation told Dr. Jeffie Pollock was out of town until Monday however she would send a message about the situation. 1422 Scheduling made aware of the cancellation of the appointment for today.

## 2017-07-15 ENCOUNTER — Ambulatory Visit
Admission: RE | Admit: 2017-07-15 | Discharge: 2017-07-15 | Disposition: A | Discharge status: Home / Self Care | Payer: Medicare Other | Source: Ambulatory Visit | Attending: Radiation Oncology | Admitting: Radiation Oncology

## 2017-07-15 ENCOUNTER — Encounter: Payer: Self-pay | Admitting: Radiation Oncology

## 2017-07-15 ENCOUNTER — Other Ambulatory Visit: Payer: Self-pay

## 2017-07-15 ENCOUNTER — Encounter: Payer: Self-pay | Admitting: Medical Oncology

## 2017-07-15 VITALS — BP 144/88 | HR 96 | Temp 97.9°F | Resp 20 | Ht >= 80 in | Wt 260.6 lb

## 2017-07-15 DIAGNOSIS — R972 Elevated prostate specific antigen [PSA]: Secondary | ICD-10-CM | POA: Diagnosis not present

## 2017-07-15 DIAGNOSIS — Z85118 Personal history of other malignant neoplasm of bronchus and lung: Secondary | ICD-10-CM | POA: Diagnosis not present

## 2017-07-15 DIAGNOSIS — C61 Malignant neoplasm of prostate: Secondary | ICD-10-CM | POA: Diagnosis not present

## 2017-07-15 DIAGNOSIS — Z87891 Personal history of nicotine dependence: Secondary | ICD-10-CM | POA: Diagnosis not present

## 2017-07-15 DIAGNOSIS — C3412 Malignant neoplasm of upper lobe, left bronchus or lung: Secondary | ICD-10-CM

## 2017-07-15 DIAGNOSIS — Z748 Other problems related to care provider dependency: Secondary | ICD-10-CM

## 2017-07-15 DIAGNOSIS — Z923 Personal history of irradiation: Secondary | ICD-10-CM | POA: Diagnosis not present

## 2017-07-15 NOTE — Progress Notes (Signed)
GU Location of Tumor / Histology:  Prostatic adenocarcinoma  If Prostate Cancer, Gleason Score is ( 4+ 4= 8) and PSA is (22)   05-24-17      02--25-19     PSA  22.41 05-10-17      PSA  19.5 04-2009       PSA  7.5  2010            PSA  0.7 Jonathan [name] presented  months ago with signs/symptoms Jonathan [name] PSA 0.7 in 2010.  He had a bladder diverticulectomy by Jonathan [name] in 2011.  He has a history of recurrent UTI's and was seen at [facility] in 2013.  He has Amoxicillin on hand for prn use.  He had been on CIC but has been off of that for several years. 06-12-17 Was taking Amoxicillin for UTI with a clear urine his IPSS 21 and SHIM is 9.   Biopsies of  (if applicable) revealed: [id number]    Diagnosis 1. LEFT BASE LATERAL PROSTATIC ADENOCARCINOMA, GLEASON SCORE 4+3=7 INVOLVES 60% OF ONE CORE (PATTERN 4= 70%). CANCER LENGTH 1.2 CM. 2. LEFT MID LATERAL SMALL FOCI OF PROSTATIC ADENOCARCINOMA, GLEASON SCORE 3+4=7 DISCONTINUOUSLY INVOLVES 70% OF ONE CORE (PATTERN 4= 40%). CANCER LENGTH 0.3 CM. 3. LEFT APEX LATERAL PROSTATIC ADENOCARCINOMA, GLEASON SCORE 3+3=6 INVOLVES 70% OF ONE CORE. CANCER LENGTH 1.19 CM. 4. LEFT BASE PROSTATIC ADENOCARCINOMA, GLEASON SCORE 4+4=8 INVOLVES 70% OF ONE CORE . CANCER LENGTH 1.26 CM. PERINEURAL INVASION IDENTIFIED 1 of 3  FINAL for Jonathan [name] (Jonathan [name]) Diagnosis(continued) 5. LEFT MID PROSTATIC ADENOCARCINOMA, GLEASON SCORE 3+4=7 INVOLVES 40% OF ONE CORE (PATTERN 4= 30%). CANCER LENGTH 0.76 CM. 6. LEFT APEX PROSTATIC ADENOCARCINOMA, GLEASON SCORE 3+4=7 INVOLVES 50% OF ONE CORE (PATTERN 4= 20%). CANCER LENGTH 0.6 CM. PERINEURAL INVASION IDENTIFIED 7. RIGHT BASE PROSTATIC ADENOCARCINOMA, GLEASON SCORE 4+4=8 INVOLVES 50% OF ONE CORE . CANCER LENGTH 0.8 CM. PERINEURAL INVASION IDENTIFIED 8. RIGHT MID PROSTATIC ADENOCARCINOMA, GLEASON SCORE 4+5=9 (GRADE GROUP 5) INVOLVES 90% OF ONE CORE. CANCER LENGTH 1.98 CM. 9. RIGHT APEX PROSTATIC  ADENOCARCINOMA, GLEASON SCORE 4+5=9 INVOLVES 90% OF ONE CORE. CANCER LENGTH 1.53 CM. PERINEURAL INVASION IDENTIFIED 10. RIGHT BASE LATERAL PROSTATIC ADENOCARCINOMA, GLEASON SCORE 4+5=9 INVOLVES 70% OF ONE CORE. CANCER LENGTH 1.26 CM. PERINEURAL INVASION IDENTIFIED 11. RIGHT MID LATERAL PROSTATIC ADENOCARCINOMA, GLEASON SCORE 4+5=9 INVOLVES 80% OF ONE CORE. CANCER LENGTH 1.36 CM. PERINEURAL INVASION AND EXTRAPROSTATIC EXTENSION IDENTIFIED 12. RIGHT APEX LATERAL PROSTATIC ADENOCARCINOMA, GLEASON SCORE 4+3=7 INVOLVES 90% OF ONE CORE (PATTERN 4= 90%). CANCER LENGTH 1.53 CM. PERINEURAL INVASION IDENTIFIED 13. LEFT SEMINAL VESICLE, x 1 SMALL FOCUS OF PROSTATIC ADENOCARCINOMA, GLEASON SCORE 3+4=7 INVOLVES <5% OF ONE CORE. CANCER LENGTH 0.06 CM.  Prostate volume 38 grams  Past/Anticipated interventions by urology, if any:  06-11-17 Jonathan [name] Past/Anticipated interventions by medical oncology, if any:   Weight changes, if any:  Wt Readings from Last 3 Encounters:  07/15/17 260 lb 9.6 oz (118.2 kg)  10/25/16 261 lb (118.4 kg)  08/27/16 266 lb (120.7 kg)    Bowel/Bladder complaints, if FBP:ZWCH 23 Sensation of incomplete emptying, weak stream, starts and stops while urinating.  Almost always he finds it difficult to postpone urination.  He has to push or strain to begin urination.  Nocturia x 3  Nausea/Vomiting, if any: No  Pain issues, if any:  10 Right side of his body  SAFETY ISSUES:  Prior radiation? :09-14-15- 09-20-16 Left lung   Pacemaker/ICD?:No  Possible current pregnancy? :N/A  Is the patient  on methotrexate? :No  Current Complaints / other details:  BP (!) 144/88 (BP Location: Right Leg, Patient Position: Sitting, Cuff Size: Large)   Pulse 96   Temp 97.9 F (36.6 C) (Oral)   Resp 20   Ht 6\' 8"  (2.032 m)   Wt 260 lb 9.6 oz (118.2 kg)   SpO2 100%   BMI 28.63 kg/m

## 2017-07-15 NOTE — Progress Notes (Signed)
Introduced myself to [name] as the prostate nurse navigator and my role. He states he is a lung cancer survivor and was treated with surgery and radiation in 2017. He missed his appointment last week and voiced he was unaware of this appointment. He states he did receive 2 shots in his stomach. He  received Firmagon 240 mg 06/26/17 - Dr. Jeffie Pollock. He is here to discuss his radiation options.

## 2017-07-15 NOTE — Progress Notes (Signed)
Radiation Oncology         [phone]) [phone] ________________________________  Outpatient Re-Consultation  Name: Jonathan [name] MRN: [id number]  Date: 07/15/2017  DOB: 09-Jul-1940  IW:[id number], [facility] Erven Colla, MD   REFERRING PHYSICIAN: Irine Seal, MD  DIAGNOSIS: 77 y.o. gentleman with high risk Stage T2b adenocarcinoma of the prostate with Gleason Score of 4+5, and PSA of 22.41    ICD-10-CM   1. Malignant neoplasm of prostate ([place]) [place] Ambulatory referral to Social Work    Ambulatory referral to Social Work    Ambulatory referral to Social Work  2. Primary malignant neoplasm of left upper lobe of lung (HCC) C34.12   3. Assistance needed with transportation Z74.8     HISTORY OF PRESENT ILLNESS: Jonathan [name] is a 77 y.o. male with a diagnosis of prostate cancer. He was noted to have an elevated PSA of 19.5 by his primary care physician, Dr. Moshe Cipro.  Accordingly, he was referred for evaluation in urology to Dr. Jeffie Pollock on 05/10/17, where a digital rectal examination was performed at that time revealing a 15 mm prostate nodule at the right apex.  A repeat PSA was performed on 05/20/2017 and was further elevated at 22.41.  Therefore, tthe patient proceeded to transrectal ultrasound with 12 biopsies of the prostate on 05/24/17.  The prostate volume measured 38 cc.  Out of 12 core biopsies, 12 were positive.  The maximum Gleason score was 4+5, and this was seen in the right base lateral, right mid gland, right mid lateral and right apex.  CT pelvis and bone scan were performed on 06/11/2017 for disease staging.  CT scan did not reveal any specific evidence of locally advanced prostate cancer or metastatic disease in the pelvis.  There were small pelvic lymph nodes that appeared unchanged as compared to prior imaging and were not pathologically enlarged. Bone scan was negative for osseous metastatic disease. The patient reviewed the biopsy results with his urologist and he has kindly  been referred today for discussion of potential radiation treatment options.  Of note, the patient has a history of Stage 1A, T1, N0, NSCLC, squamous cell carcinoma of the right and left upper lobes.  He is s/p VATS resection/right upper lobectomy in 06/2015 and his LUL disease was treated with SBRT in 08/2015.    PREVIOUS RADIATION THERAPY: Yes  09/14/2015 to 09/21/2015 SBRT Treatmet: The Left lung was treated with SBRT to 54 Gy in 3 fractions at 18 Gy per fraction.  PAST MEDICAL HISTORY:  Past Medical History:  Diagnosis Date  . Abdominal aortic aneurysm ([place])    pt denies-  . Anxiety   . Arthritis    ankle, hands , herniated disc L3-4  . CKD (chronic kidney disease)    pt denies  . COPD (chronic obstructive pulmonary disease) ([place])    normally have breathing tx prior to surgery  . Depression   . Diabetes mellitus without complication ([place])    borderline no med, controlled with diet  . Dyslipidemia   . Elevated WBCs September 2011   and UTI Hospitalized   . Emphysema of lung ([place])   . Erectile dysfunction   . Fatty liver   . Gait disorder   . Gastritis   . GERD (gastroesophageal reflux disease)   . Hiatal hernia   . History of colon polyps   . History of kidney stones   . History of kidney stones   . Hypertension   . Lesion of ulnar nerve 12/05/2012  Right side  . Lumbar spinal stenosis   . Lung cancer ([place])   . Neuropathy   . Nicotine addiction   . Obesity   . Osteoarthritis   . Osteoporosis   . Pneumonia    hx  . Polyneuropathy in other diseases classified elsewhere ([place]) 12/05/2012  . Shortness of breath dyspnea   . Vertigo   . Vitamin D deficiency   . Wears dentures    bottom      PAST SURGICAL HISTORY: Past Surgical History:  Procedure Laterality Date  . ANKLE ARTHROPLASTY Right 04/20/2015  . BLADDER SURGERY  02/2011   For Diverticulum of bladder removed  . CARPAL TUNNEL RELEASE Right 07/09/2014   Procedure: RIGHT OPEN CARPAL TUNNEL RELEASE;  Surgeon:  Mcarthur Rossetti, MD;  Location: [facility];  Service: Orthopedics;  Laterality: Right;  . CHOLECYSTECTOMY N/A 02/24/2014   Procedure: LAPAROSCOPIC CHOLECYSTECTOMY;  Surgeon: Coralie Keens, MD;  Location: [facility];  Service: General;  Laterality: N/A;  . colon polyp resection    . COLONOSCOPY    . CYSTO    . TOTAL ANKLE ARTHROPLASTY Right 04/20/2015   Procedure: TOTAL ANKLE ARTHOPLASTY;  Surgeon: Newt Minion, MD;  Location: [facility];  Service: Orthopedics;  Laterality: Right;  [name] VIDEO ASSISTED THORACOSCOPY (VATS)/ LOBECTOMY Right 07/04/2015   Procedure: VIDEO ASSISTED THORACOSCOPY (VATS)/ RIGHT UPPER LOBECTOMY;  Surgeon: Melrose Nakayama, MD;  Location: [facility];  Service: Thoracic;  Laterality: Right;  [name] VIDEO BRONCHOSCOPY N/A 06/17/2015   Procedure: VIDEO BRONCHOSCOPY;  Surgeon: Melrose Nakayama, MD;  Location: [facility];  Service: Thoracic;  Laterality: N/A;  . VIDEO BRONCHOSCOPY WITH ENDOBRONCHIAL NAVIGATION N/A 06/17/2015   Procedure: VIDEO BRONCHOSCOPY WITH ENDOBRONCHIAL NAVIGATION;  Surgeon: Melrose Nakayama, MD;  Location: [facility];  Service: Thoracic;  Laterality: N/A;  . VIDEO BRONCHOSCOPY WITH ENDOBRONCHIAL ULTRASOUND N/A 06/17/2015   Procedure: VIDEO BRONCHOSCOPY WITH ENDOBRONCHIAL ULTRASOUND;  Surgeon: Melrose Nakayama, MD;  Location: [facility];  Service: Thoracic;  Laterality: N/A;    FAMILY HISTORY:  Family History  Problem Relation Age of Onset  . Cancer Father        Asbestos  . Colon cancer Neg Hx   . Colon polyps Neg Hx   . Kidney disease Neg Hx   . Esophageal cancer Neg Hx   . Gallbladder disease Neg Hx   . Heart disease Neg Hx   . Diabetes Neg Hx     SOCIAL HISTORY:  Social History   Socioeconomic History  . Marital status: Widowed    Spouse name: Not on file  . Number of children: 2  . Years of education: college  . Highest education level: Not on file  Occupational History  . Occupation: retired  Scientific laboratory technician  . Financial resource  strain: Not on file  . Food insecurity:    Worry: Not on file    Inability: Not on file  . Transportation needs:    Medical: Not on file    Non-medical: Not on file  Tobacco Use  . Smoking status: Former Smoker    Packs/day: 0.50    Years: 35.00    Pack years: 17.50    Types: Cigarettes    Last attempt to quit: 02/13/2015    Years since quitting: 2.4  . Smokeless tobacco: Never Used  Substance and Sexual Activity  . Alcohol use: No    Alcohol/week: 0.0 oz  . Drug use: No  . Sexual activity: Not on file  Lifestyle  .  Physical activity:    Days per week: Not on file    Minutes per session: Not on file  . Stress: Not on file  Relationships  . Social connections:    Talks on phone: Not on file    Gets together: Not on file    Attends religious service: Not on file    Active member of club or organization: Not on file    Attends meetings of clubs or organizations: Not on file    Relationship status: Not on file  . Intimate partner violence:    Fear of current or ex partner: Not on file    Emotionally abused: Not on file    Physically abused: Not on file    Forced sexual activity: Not on file  Other Topics Concern  . Not on file  Social History Narrative  . Not on file  The patient is single and lives in [place].   ALLERGIES: Lisinopril; Onion; Ciprofloxacin; Levaquin [levofloxacin]; and Triamterene-hctz  MEDICATIONS:  Current Outpatient Medications  Medication Sig Dispense Refill  . albuterol (PROVENTIL HFA;VENTOLIN HFA) 108 (90 BASE) MCG/ACT inhaler Inhale 1 puff into the lungs every 6 (six) hours as needed for wheezing or shortness of breath.    [name] atenolol (TENORMIN) 25 MG tablet Take 1 tablet (25 mg total) by mouth at bedtime. (Patient taking differently: Take 25 mg by mouth every morning. ) 30 tablet 1  . esomeprazole (NEXIUM) 40 MG capsule Take 40 mg by mouth daily.     [name] ipratropium-albuterol (DUONEB) 0.5-2.5 (3) MG/3ML SOLN Take 3 mLs by nebulization daily as  needed (wheezing).     [name] KLOR-CON M10 10 MEQ tablet Take 1 tablet by mouth daily.    . metFORMIN (GLUCOPHAGE) 500 MG tablet Take 250 mg by mouth daily.     . metoCLOPramide (REGLAN) 5 MG tablet Take 5 mg by mouth 4 (four) times daily -  before meals and at bedtime.     . Oxycodone HCl 10 MG TABS Take 2.5 mg by mouth 4 (four) times daily as needed.    . predniSONE (DELTASONE) 10 MG tablet Take 10 mg by mouth 2 (two) times daily as needed (for gout symptoms). For gout per patient    . Probiotic Product (PROBIOTIC DAILY PO) Take 1 capsule by mouth daily as needed.    . sennosides-docusate sodium (SENOKOT-S) 8.6-50 MG tablet Take 1 tablet by mouth every other day.    . sildenafil (VIAGRA) 100 MG tablet Take 100 mg by mouth daily as needed.    . tamsulosin (FLOMAX) 0.4 MG CAPS capsule Take 0.4 mg by mouth daily as needed. Reported on 10/12/2015    . TENORETIC 50 50-25 MG tablet Take 1 tablet by mouth at bedtime. Reported on 10/12/2015    . tiotropium (SPIRIVA) 18 MCG inhalation capsule Place 18 mcg into inhaler and inhale daily as needed (Shortness of breath). Reported on 10/12/2015    . varenicline (CHANTIX STARTING MONTH PAK) 0.5 MG X 11 & 1 MG X 42 tablet Take one 0.5 mg tablet by mouth once daily for 3 days, then increase to one 0.5 mg tablet twice daily for 4 days, then increase to one 1 mg tablet twice daily. 53 tablet 0   No current facility-administered medications for this encounter.    Facility-Administered Medications Ordered in Other Encounters  Medication Dose Route Frequency Provider Last Rate Last Dose  . lactated ringers infusion    Continuous PRN Kerby Less, CRNA 0 mL/hr at 07/02/15  2100      REVIEW OF SYSTEMS:  On review of systems, the patient reports that he is doing well overall. He denies any chest pain, shortness of breath, cough, fevers, chills, night sweats, or unintended weight changes. He denies any bowel disturbances, and denies abdominal pain, nausea or vomiting. He  reports 10/10 pain on the right side of his body and attributes this to his peripheral neuropathy which is on disability for. His IPSS was 23, indicating severe urinary symptoms. He is able to complete sexual activity with some attempts. A complete review of systems is obtained and is otherwise negative.    PHYSICAL EXAM:  Wt Readings from Last 3 Encounters:  07/15/17 260 lb 9.6 oz (118.2 kg)  10/25/16 261 lb (118.4 kg)  08/27/16 266 lb (120.7 kg)   Temp Readings from Last 3 Encounters:  07/15/17 97.9 F (36.6 C) (Oral)  05/24/17 98 F (36.7 C) (Oral)  10/25/16 97.6 F (36.4 C) (Oral)   BP Readings from Last 3 Encounters:  07/15/17 (!) 144/88  05/24/17 (!) 137/98  10/25/16 132/74   Pulse Readings from Last 3 Encounters:  07/15/17 96  05/24/17 100  10/25/16 66   Pain Assessment Pain Score: 10-Worst pain ever Pain Loc: Abdomen(Right side od body)/10  In general this is a well appearing African American male in no acute distress. He is alert and oriented x4 and appropriate throughout the examination. HEENT reveals that the patient is normocephalic, atraumatic. EOMs are intact. Cardiopulmonary assessment is negative for acute distress and he exhibits normal effort.    KPS = 90  100 - Normal; no complaints; no evidence of disease. 90   - Able to carry on normal activity; minor signs or symptoms of disease. 80   - Normal activity with effort; some signs or symptoms of disease. 48   - Cares for self; unable to carry on normal activity or to do active work. 60   - Requires occasional assistance, but is able to care for most of his personal needs. 50   - Requires considerable assistance and frequent medical care. 11   - Disabled; requires special care and assistance. 70   - Severely disabled; hospital admission is indicated although death not imminent. 61   - Very sick; hospital admission necessary; active supportive treatment necessary. 10   - Moribund; fatal processes  progressing rapidly. 0     - Dead  Karnofsky DA, Abelmann [place], Craver LS and Bur[facility]al [facility] [phone]) The use of the nitrogen mustards in the palliative treatment of carcinoma: with particular reference to bronchogenic carcinoma Cancer 1 634-56  LABORATORY DATA:  Lab Results  Component Value Date   WBC 7.4 10/25/2016   HGB 12.8 (L) 10/25/2016   HCT 38.9 (L) 10/25/2016   MCV 90.9 10/25/2016   PLT 205 10/25/2016   Lab Results  Component Value Date   NA 139 10/25/2016   K 3.2 (L) 10/25/2016   CL 103 10/25/2016   CO2 28 10/25/2016   Lab Results  Component Value Date   ALT 17 10/25/2016   AST 19 10/25/2016   ALKPHOS 40 10/25/2016   BILITOT 0.4 10/25/2016     RADIOGRAPHY: No results found.    IMPRESSION/PLAN: 1. 77 y.o. gentleman with Stage T2b adenocarcinoma of the prostate with Gleason Score of 4+5, and PSA of 22.41. We reviewed the implication of T stage, PSA, and Gleason Score as it pertains to classification and treatment options.The patient is counseled on the need to proceed with continuing  his ADT for two years. He has had 2 Firmagon injections on 06/26/17, and due for Lupron on 07/26/17. We recommend a course of 8 weeks of external radiotherapy to the prostate following the placement of fiducial markers and SpaceOAR if he chose treatment here, or consider evaluation with Dr. Frutoso Chase in [place]. The patient is interested in treatment here in [place]. We will coordinate with our scheduling staff to get him scheduled for fiducial markers and SpaceOAR.  2. Stage IA, T1, N0, NSCLC, squamous cell carcinoma of the right and left upper lobes. The patient will be due for CT imaging, and I will follow up with these results next week.  3. Transportation needs. A letter explaining his needs for help with transportation was provided to the patient for him to submit this to the [place].     Carola Rhine, PAC    [place] Pita, MD  [facility] Direct Dial: [phone]   Fax: [phone] [email or web address]  Skype  [email or web address]    Page Me   This document serves as a record of services personally performed by [place] Pita, MD and Shona Simpson, PA-C. It was created on their behalf by Rae Lips, a trained medical scribe. The creation of this record is based on the scribe's personal observations and the providers' statements to them. This document has been checked and approved by the attending providers.

## 2017-07-17 ENCOUNTER — Encounter: Payer: Self-pay | Admitting: General Practice

## 2017-07-17 NOTE — Progress Notes (Signed)
[facility] Psychosocial Distress Screening Clinical Social Work  Clinical Social Work was referred by distress screening protocol.  The patient scored a 8 on the Psychosocial Distress Thermometer which indicates moderate distress. Clinical Social Worker Edwyna Shell to assess for distress and other psychosocial needs. Spoke w patient by phone while he was returning from [facility] where was asking for help w installing ramp at home.  Is awaiting appt w Dr Jeffie Pollock, urologist, for determination of best course of treatment and appointment scheduling.  Hopes that appointments can be scheduled early in the morning, [facility] is a lengthy drive away from his home.  Has a measured approach to process of determining best treatment plan, willing to wait to meet w various providers who will develop treatment protocol.  Requested mailed information packet and will contact [facility] as needed.    ONCBCN DISTRESS SCREENING 07/15/2017  Screening Type Change in Status  Distress experienced in past week (1-10) 8  Emotional problem type Depression;Nervousness/Anxiety;Adjusting to illness;Isolation/feeling alone;Boredom  Physical Problem type Pain;Getting around;Bathing/dressing;Loss of appetitie;Talking;Constipation/diarrhea;Changes in urination;Tingling hands/feet;Sexual problems;Swollen arms/legs  Physician notified of physical symptoms Yes    Clinical Social Worker follow up needed: No.  If yes, follow up plan:   Edwyna Shell, LCSW Clinical Social Worker Phone:  [phone]

## 2017-07-18 ENCOUNTER — Encounter (INDEPENDENT_AMBULATORY_CARE_PROVIDER_SITE_OTHER): Payer: Self-pay | Admitting: Orthopedic Surgery

## 2017-07-18 ENCOUNTER — Ambulatory Visit (INDEPENDENT_AMBULATORY_CARE_PROVIDER_SITE_OTHER): Payer: Medicare Other | Admitting: Orthopedic Surgery

## 2017-07-18 DIAGNOSIS — E1142 Type 2 diabetes mellitus with diabetic polyneuropathy: Secondary | ICD-10-CM

## 2017-07-18 DIAGNOSIS — R202 Paresthesia of skin: Secondary | ICD-10-CM

## 2017-07-18 DIAGNOSIS — Z96661 Presence of right artificial ankle joint: Secondary | ICD-10-CM

## 2017-07-18 DIAGNOSIS — R200 Anesthesia of skin: Secondary | ICD-10-CM | POA: Diagnosis not present

## 2017-07-18 NOTE — Progress Notes (Signed)
Office Visit Note   Patient: Jonathan [name]           Date of Birth: 12-05-1940           MRN: [id number] Visit Date: 07/18/2017              Requested by: Celedonio Savage, MD [address] [place], [place] [address] PCP: [facility]  Chief Complaint  Patient presents with  . Left Ankle - Pain      HPI: Patient is a 77 year old gentleman who was seen in follow-up with multiple orthopedic as well as other medical conditions.  Patient is status post right total ankle arthroplasty he complains of neuropathic pain in both lower extremities he states he has still pain and swelling in the left ankle he has had a brace made for his ankle which we will complete the paperwork for work.  Patient states that the brace does help a lot.  Patient states that almost all of his problems are due to agent orange while in [place].  He states that he has had lung cancer and now is been diagnosed with prostate cancer he states he will be treated by Dr. Dallie Piles and urology.  Assessment & Plan: Visit Diagnoses:  1. Diabetic polyneuropathy associated with type 2 diabetes mellitus ([place])   2. H/O total ankle replacement, right   3. Numbness and tingling of foot     Plan: Plan.  Patient should use his brace do not feel there is any further surgical intervention that would be helpful for both lower extremities.  With patient's current orthopedic issues neuropathy and recent prostate cancer patient will need assistance at home to perform his activities of daily living such as cooking cleaning and shopping.  Patient is able to feed himself.  Follow-Up Instructions: Return if symptoms worsen or fail to improve.   Ortho Exam  Patient is alert, oriented, no adenopathy, well-dressed, normal affect, normal respiratory effort. Examination patient has an antalgic gait with both lower extremities.  He has good range of motion of his ankles.  He has venous stasis swelling there are no plantar ulcers no  venous stasis ulcers.  Imaging: No results found. No images are attached to the encounter.  Labs: Lab Results  Component Value Date   HGBA1C 6.3 (H) 06/13/2015   HGBA1C 6.6 (H) 04/18/2015   HGBA1C 6.0 (H) 11/15/2012   REPTSTATUS 05/21/2017 FINAL 05/20/2017   GRAMSTAIN  06/17/2015    NO WBC SEEN NO SQUAMOUS EPITHELIAL CELLS SEEN NO ORGANISMS SEEN Performed at [facility]  05/20/2017    NO GROWTH Performed at [facility] [address] [place], [place] [address]    LABORGA STAPHYLOCOCCUS SPECIES (COAGULASE NEGATIVE) 02/24/2007    @LABSALLVALUES (HGBA1)@  There is no height or weight on file to calculate BMI.  Orders:  No orders of the defined types were placed in this encounter.  No orders of the defined types were placed in this encounter.    Procedures: No procedures performed  Clinical Data: No additional findings.  ROS:  All other systems negative, except as noted in the HPI. Review of Systems  Objective: Vital Signs: There were no vitals taken for this visit.  Specialty Comments:  No specialty comments available.  PMFS History: Patient Active Problem List   Diagnosis Date Noted  . Malignant neoplasm of prostate ([place]) 07/10/2017  . Arthritis of right subtalar joint 04/18/2017  . Claw hand due to intrinsic minus  deformity, right 05/22/2016  . Idiopathic chronic venous hypertension of both lower extremities with inflammation 05/22/2016  . Fibrosis of subtalar joint, right 03/08/2016  . Primary malignant neoplasm of left upper lobe of lung ([place]) 09/21/2015  . Lung cancer ([place]) 07/04/2015  . Diabetes mellitus type II, controlled ([place]) 06/01/2015  . 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 Emphysema of lung ([place])   . Kidney stones   . Osteoporosis   . H/O total ankle replacement, right 04/20/2015  . Carpal tunnel syndrome -  right 07/09/2014  . Abdominal bloating 06/14/2014  . History of colonic polyps 06/14/2014  . Diabetic polyneuropathy associated with type 2 diabetes mellitus ([place]) 12/05/2012  . Lesion of ulnar nerve 12/05/2012  . Syncope 11/15/2012  . Gout 11/15/2012  . Constipation 04/02/2009  . CYSTITIS, RECURRENT 12/02/2008  . COPD 10/26/2008  . DIVERTICULUM, BLADDER 10/11/2008  . INSOMNIA 07/24/2008  . ACUTE CYSTITIS 07/20/2008  . FATIGUE 07/20/2008  . DEPRESSION 02/27/2008  . CHEST PAIN UNSPECIFIED 02/27/2008  . NICOTINE ADDICTION 08/12/2007  . DYSLIPIDEMIA 07/22/2007  . OBESITY 07/22/2007  . ERECTILE DYSFUNCTION 07/22/2007  . HYPERTENSION 07/22/2007   Past Medical History:  Diagnosis Date  . Abdominal aortic aneurysm ([place])    pt denies-  . Anxiety   . Arthritis    ankle, hands , herniated disc L3-4  . CKD (chronic kidney disease)    pt denies  . COPD (chronic obstructive pulmonary disease) ([place])    normally have breathing tx prior to surgery  . Depression   . Diabetes mellitus without complication ([place])    borderline no med, controlled with diet  . Dyslipidemia   . Elevated WBCs September 2011   and UTI Hospitalized   . Emphysema of lung ([place])   . Erectile dysfunction   . Fatty liver   . Gait disorder   . Gastritis   . GERD (gastroesophageal reflux disease)   . Hiatal hernia   . History of colon polyps   . History of kidney stones   . History of kidney stones   . Hypertension   . Lesion of ulnar nerve 12/05/2012   Right side  . Lumbar spinal stenosis   . Lung cancer ([place])   . Neuropathy   . Nicotine addiction   . Obesity   . Osteoarthritis   . Osteoporosis   . Pneumonia    hx  . Polyneuropathy in other diseases classified elsewhere ([place]) 12/05/2012  . Shortness of breath dyspnea   . Vertigo   . Vitamin D deficiency   . Wears dentures    bottom    Family History  Problem Relation Age of Onset  . Cancer Father        Asbestos  . Colon cancer Neg Hx   . Colon  polyps Neg Hx   . Kidney disease Neg Hx   . Esophageal cancer Neg Hx   . Gallbladder disease Neg Hx   . Heart disease Neg Hx   . Diabetes Neg Hx     Past Surgical History:  Procedure Laterality Date  . ANKLE ARTHROPLASTY Right 04/20/2015  . BLADDER SURGERY  02/2011   For Diverticulum of bladder removed  . CARPAL TUNNEL RELEASE Right 07/09/2014   Procedure: RIGHT OPEN CARPAL TUNNEL RELEASE;  Surgeon: Mcarthur Rossetti, MD;  [facility];  Service: Orthopedics;  Laterality: Right;  . CHOLECYSTECTOMY N/A 02/24/2014  Procedure: LAPAROSCOPIC CHOLECYSTECTOMY;  Surgeon: Coralie Keens, MD;  Location: [facility];  Service: General;  Laterality: N/A;  . colon polyp resection    . COLONOSCOPY    . CYSTO    . TOTAL ANKLE ARTHROPLASTY Right 04/20/2015   Procedure: TOTAL ANKLE ARTHOPLASTY;  Surgeon: Newt Minion, MD;  Location: [facility];  Service: Orthopedics;  Laterality: Right;  [name] VIDEO ASSISTED THORACOSCOPY (VATS)/ LOBECTOMY Right 07/04/2015   Procedure: VIDEO ASSISTED THORACOSCOPY (VATS)/ RIGHT UPPER LOBECTOMY;  Surgeon: Melrose Nakayama, MD;  Location: [facility];  Service: Thoracic;  Laterality: Right;  [name] VIDEO BRONCHOSCOPY N/A 06/17/2015   Procedure: VIDEO BRONCHOSCOPY;  Surgeon: Melrose Nakayama, MD;  Location: [facility];  Service: Thoracic;  Laterality: N/A;  . VIDEO BRONCHOSCOPY WITH ENDOBRONCHIAL NAVIGATION N/A 06/17/2015   Procedure: VIDEO BRONCHOSCOPY WITH ENDOBRONCHIAL NAVIGATION;  Surgeon: Melrose Nakayama, MD;  Location: [facility];  Service: Thoracic;  Laterality: N/A;  . VIDEO BRONCHOSCOPY WITH ENDOBRONCHIAL ULTRASOUND N/A 06/17/2015   Procedure: VIDEO BRONCHOSCOPY WITH ENDOBRONCHIAL ULTRASOUND;  Surgeon: Melrose Nakayama, MD;  Location: [facility];  Service: Thoracic;  Laterality: N/A;   Social History   Occupational History  . Occupation: retired  Tobacco Use  . Smoking status: Former Smoker    Packs/day: 0.50    Years: 35.00    Pack years: 17.50    Types:  Cigarettes    Last attempt to quit: 02/13/2015    Years since quitting: 2.4  . Smokeless tobacco: Never Used  Substance and Sexual Activity  . Alcohol use: No    Alcohol/week: 0.0 oz  . Drug use: No  . Sexual activity: Not on file

## 2017-07-19 ENCOUNTER — Telehealth: Payer: Self-pay | Admitting: *Deleted

## 2017-07-19 NOTE — Addendum Note (Signed)
Encounter addended by: [place] Pita, MD on: 07/19/2017 5:47 PM  Actions taken: Actions taken from a BestPractice Advisory, Visit Navigator Flowsheet section accepted

## 2017-07-19 NOTE — Telephone Encounter (Signed)
Called patient to inform of Ct scan for 07-22-17 - arrival time - 4:45 pm @ [facility], pt. To have water only - 4 hrs. Prior to test, spoke with patient and he is aware of this test.

## 2017-07-22 ENCOUNTER — Telehealth: Payer: Self-pay | Admitting: *Deleted

## 2017-07-22 ENCOUNTER — Ambulatory Visit (HOSPITAL_COMMUNITY)
Admission: RE | Admit: 2017-07-22 | Discharge: 2017-07-22 | Disposition: A | Discharge status: Home / Self Care | Payer: Medicare Other | Source: Ambulatory Visit | Attending: Radiation Oncology | Admitting: Radiation Oncology

## 2017-07-22 DIAGNOSIS — C3412 Malignant neoplasm of upper lobe, left bronchus or lung: Secondary | ICD-10-CM

## 2017-07-22 NOTE — Telephone Encounter (Signed)
Voicemail requesting return call in reference to CT scans at [facility] forward XRT.

## 2017-07-23 ENCOUNTER — Other Ambulatory Visit: Payer: Self-pay | Admitting: Radiation Oncology

## 2017-07-23 ENCOUNTER — Ambulatory Visit
Admission: RE | Admit: 2017-07-23 | Discharge: 2017-07-23 | Disposition: A | Discharge status: Home / Self Care | Payer: Self-pay | Source: Ambulatory Visit | Attending: Radiation Oncology | Admitting: Radiation Oncology

## 2017-07-23 DIAGNOSIS — B351 Tinea unguium: Secondary | ICD-10-CM | POA: Diagnosis not present

## 2017-07-23 DIAGNOSIS — M7989 Other specified soft tissue disorders: Secondary | ICD-10-CM

## 2017-07-23 DIAGNOSIS — E1351 Other specified diabetes mellitus with diabetic peripheral angiopathy without gangrene: Secondary | ICD-10-CM | POA: Diagnosis not present

## 2017-07-23 DIAGNOSIS — M79671 Pain in right foot: Secondary | ICD-10-CM | POA: Diagnosis not present

## 2017-07-23 DIAGNOSIS — M79672 Pain in left foot: Secondary | ICD-10-CM | POA: Diagnosis not present

## 2017-07-24 ENCOUNTER — Encounter: Payer: Self-pay | Admitting: *Deleted

## 2017-07-24 ENCOUNTER — Telehealth: Payer: Self-pay | Admitting: Radiation Oncology

## 2017-07-24 DIAGNOSIS — C3412 Malignant neoplasm of upper lobe, left bronchus or lung: Secondary | ICD-10-CM

## 2017-07-24 NOTE — Progress Notes (Signed)
On 07-24-17 fax medical records to [place] department  Of health and human services , it was consult note

## 2017-07-24 NOTE — Telephone Encounter (Signed)
I left a message for the patient that we would need a repeat CT scan of the chest in 6 months to follow him in surveillance for his early lung cancer. In reviewing his prior CTPA from [facility], this appears to show stable findings. I encouraged him to call me back to discuss further.

## 2017-07-26 ENCOUNTER — Other Ambulatory Visit (INDEPENDENT_AMBULATORY_CARE_PROVIDER_SITE_OTHER): Payer: Self-pay

## 2017-07-26 ENCOUNTER — Telehealth (INDEPENDENT_AMBULATORY_CARE_PROVIDER_SITE_OTHER): Payer: Self-pay

## 2017-07-26 ENCOUNTER — Ambulatory Visit (INDEPENDENT_AMBULATORY_CARE_PROVIDER_SITE_OTHER): Payer: Medicare Other | Admitting: Urology

## 2017-07-26 DIAGNOSIS — Z96661 Presence of right artificial ankle joint: Secondary | ICD-10-CM

## 2017-07-26 DIAGNOSIS — R3914 Feeling of incomplete bladder emptying: Secondary | ICD-10-CM

## 2017-07-26 DIAGNOSIS — C61 Malignant neoplasm of prostate: Secondary | ICD-10-CM | POA: Diagnosis not present

## 2017-07-26 DIAGNOSIS — N403 Nodular prostate with lower urinary tract symptoms: Secondary | ICD-10-CM

## 2017-07-26 DIAGNOSIS — R200 Anesthesia of skin: Secondary | ICD-10-CM

## 2017-07-26 DIAGNOSIS — R202 Paresthesia of skin: Secondary | ICD-10-CM

## 2017-07-26 NOTE — Telephone Encounter (Signed)
Per Dr. Sharol Given pt requires a functional capacity exam in order to fill out requested paperwork. I have entered the referral and will will await sch of this. I tried to call pt to advise that this is the next step and there was no answer and no voice mail I will try again later.

## 2017-07-29 ENCOUNTER — Encounter: Payer: Self-pay | Admitting: *Deleted

## 2017-07-29 ENCOUNTER — Telehealth (INDEPENDENT_AMBULATORY_CARE_PROVIDER_SITE_OTHER): Payer: Self-pay | Admitting: Orthopedic Surgery

## 2017-07-29 NOTE — Telephone Encounter (Signed)
Patient returned your phone call, I read him the message that you wrote earlier in his chart but he still has a few questions. If you could give him a call back at [phone]

## 2017-07-29 NOTE — Progress Notes (Signed)
[place] with Jonathan [name] to let him know that Jonathan [name] still needs him to have the CT follow up on his lung cancer we treated in the past.  That it needs to be rescheduled.  He "stated he just had a CT scan done June 26, 2017 and it was okay. He "sated he would speak with Shona Simpson, PA. to explain why he did not want to repeat the scan.  I told him I would have her give him a call.

## 2017-07-29 NOTE — Telephone Encounter (Signed)
Called pt to advise that in order to complete the paperwork that we have for him Dr. Sharol Given advised the pt will need a functional capacity exam. A referral has been made for this and I wanted to make pt aware to expect this phone call. Once the exam has been completed Dr. Sharol Given will complete the paperwork based on the findings. To call with questions.

## 2017-07-30 ENCOUNTER — Telehealth (INDEPENDENT_AMBULATORY_CARE_PROVIDER_SITE_OTHER): Payer: Self-pay | Admitting: Orthopedic Surgery

## 2017-07-30 NOTE — Telephone Encounter (Signed)
I called pt and he wanted me to explain what an FCE is and what would be involved and if they would come to his house. All questions answered and pt is satisfied with this. He is wanting to get this set up asap so that his paperwork can be completed.

## 2017-07-30 NOTE — Telephone Encounter (Signed)
Called and advised that yes pt needs an [employer]

## 2017-07-30 NOTE — Telephone Encounter (Signed)
Haley with [facility] called to verify if the referral is for a FCE or just for treating/eval the diagnosis. She wanted to make sure you knew that an FCE is not paid for by insurance and that it would be out of pocket for the patient. Please call Hildred Alamin back to verify # [phone]

## 2017-07-31 NOTE — Telephone Encounter (Signed)
Hildred Alamin called again to advise that patient does not want the FCE done unless the [place] will approve it. She said the rx for the FCE would need to come from [name] to go to the [place]. She said if you have any questions call her. # [phone]

## 2017-08-05 NOTE — Telephone Encounter (Signed)
Can we just send the referral? See message below and let me know if I need to do anything.

## 2017-08-06 ENCOUNTER — Other Ambulatory Visit: Payer: Self-pay | Admitting: Urology

## 2017-08-09 ENCOUNTER — Other Ambulatory Visit: Payer: Self-pay | Admitting: Urology

## 2017-08-09 DIAGNOSIS — C61 Malignant neoplasm of prostate: Secondary | ICD-10-CM

## 2017-08-13 NOTE — Telephone Encounter (Signed)
I sent the referral to [facility] PT- they are a [place] should be able to pay full. Pending appt

## 2017-08-27 DIAGNOSIS — I10 Essential (primary) hypertension: Secondary | ICD-10-CM | POA: Diagnosis not present

## 2017-08-27 DIAGNOSIS — R972 Elevated prostate specific antigen [PSA]: Secondary | ICD-10-CM | POA: Diagnosis not present

## 2017-08-27 DIAGNOSIS — M25571 Pain in right ankle and joints of right foot: Secondary | ICD-10-CM | POA: Diagnosis not present

## 2017-08-27 DIAGNOSIS — C61 Malignant neoplasm of prostate: Secondary | ICD-10-CM | POA: Diagnosis not present

## 2017-08-27 DIAGNOSIS — Z6828 Body mass index (BMI) 28.0-28.9, adult: Secondary | ICD-10-CM | POA: Diagnosis not present

## 2017-08-27 DIAGNOSIS — K5901 Slow transit constipation: Secondary | ICD-10-CM | POA: Diagnosis not present

## 2017-08-27 DIAGNOSIS — E0865 Diabetes mellitus due to underlying condition with hyperglycemia: Secondary | ICD-10-CM | POA: Diagnosis not present

## 2017-08-27 DIAGNOSIS — R1084 Generalized abdominal pain: Secondary | ICD-10-CM | POA: Diagnosis not present

## 2017-08-27 DIAGNOSIS — D519 Vitamin B12 deficiency anemia, unspecified: Secondary | ICD-10-CM | POA: Diagnosis not present

## 2017-08-27 DIAGNOSIS — Z681 Body mass index (BMI) 19 or less, adult: Secondary | ICD-10-CM | POA: Diagnosis not present

## 2017-08-27 DIAGNOSIS — R531 Weakness: Secondary | ICD-10-CM | POA: Diagnosis not present

## 2017-08-29 ENCOUNTER — Encounter (HOSPITAL_BASED_OUTPATIENT_CLINIC_OR_DEPARTMENT_OTHER): Payer: Self-pay | Admitting: *Deleted

## 2017-08-29 DIAGNOSIS — C3411 Malignant neoplasm of upper lobe, right bronchus or lung: Secondary | ICD-10-CM | POA: Diagnosis not present

## 2017-08-29 DIAGNOSIS — C61 Malignant neoplasm of prostate: Secondary | ICD-10-CM | POA: Diagnosis not present

## 2017-08-29 DIAGNOSIS — Z510 Encounter for antineoplastic radiation therapy: Secondary | ICD-10-CM | POA: Diagnosis not present

## 2017-08-29 DIAGNOSIS — C3412 Malignant neoplasm of upper lobe, left bronchus or lung: Secondary | ICD-10-CM | POA: Diagnosis not present

## 2017-09-02 ENCOUNTER — Other Ambulatory Visit: Payer: Self-pay

## 2017-09-02 ENCOUNTER — Encounter (HOSPITAL_BASED_OUTPATIENT_CLINIC_OR_DEPARTMENT_OTHER): Payer: Self-pay | Admitting: *Deleted

## 2017-09-02 NOTE — Progress Notes (Addendum)
Spoke w/ pt via phone for pre-op interview.  Npo after mn w/ exception clear liquids until 0645 (no cream/milk products).  Arrive at 1015.  Current ekg/ cxr/ chest ct in chart and epic.  Per pt had recent lab work at day [facility] in [place].  Called and requested them to be faxed.Will take nexium and atenolol am dos w/ sips of water. Since pt has copd w/ emphysema will start spiriva today daily until surgery and bring rescue inhaler.  Pt requested faxed instructions printed and faxed to pt #.  ADDENDUM:  Received lab results from pt pcp, erin carroll PA, dated 08-27-2017 , paced in chart.  Lab did not include cbc.  Pt will need [employer].

## 2017-09-02 NOTE — Progress Notes (Addendum)
Your procedure is scheduled on  ___06-13-2019_____  Report to [facility]. M._____   Call this number if you have problems the morning of surgery  [phone].   OUR ADDRESS IS [address].  WE ARE LOCATED IN [facility].                                     REMEMBER:  DO NOT EAT FOOD AFTER MIDNIGHT WITH EXCEPTION CLEAR LIQUIDS UNTIL 6:45 AM DAY OF SURGERY (WATER, GATORADE, SPRITE, GINGER-ALE, BROTH, BLACK COFFEE (NO CREAM/ MILK PRODUCTS).    TAKE THESE MEDICATIONS MORNING OF SURGERY WITH A SIP OF WATER:  __NEXIUM, ATENOLOL, BRING RESCUE INHALER,  START SPIRIVA TONIGHT DAILY UNTIL SURGERY DAY__                                    DO NOT WEAR LOTIONS, POWDERS, PERFUMES OR DEODORANT. MEN MAY SHAVE FACE AND NECK. CONTACTS, GLASSES, OR DENTURES MAY NOT BE WORN TO SURGERY.                                    [facility] IS NOT RESPONSIBLE  FOR ANY BELONGINGS.                                                                    [name]

## 2017-09-04 NOTE — H&P (Signed)
CC: I have prostate cancer.  HPI: Jonathan [name] is a 77 year-old male established patient who is here evaluation for treatment of prostate cancer.  His prostate cancer was diagnosed 05/24/2017. He does have the pathology report from his biopsy. His PSA at his time of diagnosis was 19.5. His most recent PSA is 15.8.   Jonathan [name] returns today in f/u for a Lupron injection. He got Firmagon 240mg  SQ 06/26/17. His PSA was 15.8 and testosterone 344 on 06/26/17 but we don't have labs prior to this visit. His IPSS has declined to 7 but he also doubled up on the tamsulosin to 0.8mg . He is having hot flashes that are transient. He has fatigue as well. He has seen Dr. Tammi Klippel about radiation therapy and will be getting set up for radiation therapy.   GU Hx: He was biopsied for a PSA of 19.5 with a 1.5 nodule at the right apex. He was found to have a 39ml prostate and had 13/13 cores positive with Gleason 9(4+5) disease in the 4 of the 6 right lobe biopsies. He had a positive biopsy with 7(3+4) in the left SV. The remaining cores were Gleason 6 - 8. He had a negative bone scan and only small stable pelvic nodes on CT. He has some enhancement in the left prostate and chronic bladder wall thickenin. He has had a prior diverticulectomy and has chronic UTI's and is currently on amoxicillin for that with a clear urine. His IPSS is 21 and his SHIM is 9. He has multiple comorbidities including diabetes and lung cancer in remission.     CC: AUA Questions Scoring.  HPI:     AUA Symptom Score: 50% of the time he has the sensation of not emptying his bladder completely when finished urinating. More than 50% of the time he has to urinate again fewer than two hours after he has finished urinating. 50% of the time he has to start and stop again several times when he urinates. Almost always he finds it difficult to postpone urination. Almost always he has a weak urinary stream. 50% of the time he has to push or strain to begin  urination. He has to get up to urinate 3 times from the time he goes to bed until the time he gets up in the morning.   Calculated AUA Symptom Score: 26    QOL Score: He would feel terrible if he had to live with his urinary condition the way it is now for the rest of his life.   Calculated QOL Symptom Score: 6    ALLERGIES: Bactrim Cipro Erythromycin    MEDICATIONS: Viagra 100 mg tablet  Amoxicillin 500 mg capsule  Atenolol-Chlorthalidone 50 mg-25 mg tablet  Chantix 0.5 mg (11)-1 mg (42) tablet, dose pack  Flomax 0.4 mg capsule  Furosemide 40 MG Oral Tablet Oral  Ibuprofen TABS Oral  Klor-Con 10 10 meq tablet, extended release  Latanoprost 0.005 % drops  Linzess 72 mcg capsule  Metoprolol Tartrate 50 MG Oral Tablet Oral  Mupirocin 2 % ointment  Oxycodone Hcl 10 mg tablet  Pantoprazole Sodium 20 mg tablet, delayed release  PredniSONE TABS Oral  Promethazine Hcl 25 mg tablet  Reglan 5 mg tablet  Simbrinza 1 %-0.2 % suspension, drops  Spiriva  Vitamin D TABS Oral  Zanaflex 4 mg tablet     GU PSH: Cysto Bladder Stone <2.5cm - 2008 Partial Cystectomy - about 2011 Prostate Needle Biopsy - 05/24/2017      PSH  Notes: Bladder Surgery,  Cystoscopy With Fragmentation Of Bladder Calculus  lung surgery  ankle surgery   NON-GU PSH: Carpal tunnel surgery Cholecystectomy (laparoscopic) Lung lobectomy, Left - about 2015 Surgical Pathology, Gross And Microscopic Examination For Prostate Needle - 05/24/2017    GU PMH: ED due to arterial insufficiency, He has severe ED. - 06/12/2017 Prostate Cancer, He has high risk T3b N0 M0 Gleason 9 prostate cancer that is likely as not related to his agent orange exposure. I have discussed options for therapy but with his locally advanced disease and comorbidities I think he will be best served with ADT and EXRT. I will have him return for Firmagon 240mg  and see Dr. Ledon Snare for consideration of radiation therapy. He will see me back in 6-8  weeks for a PSA and testosterone with Lupron 45mg . I will place fiducials and SpaceOAR based on Dr. Johny Shears recommendations. - 06/12/2017 Elevated PSA - 05/24/2017, He has a markedly elevated PSA with a right apical nodule that is suspicious for cancer. I am going to get him set up for a biopsy and reviewed the risks of bleeding, infection and voiding difficulty. I will send antibiotics based on his urine culture today and will repeat a UA and culture a week prior to the biopsy. I will repeat his PSA today along with a testosterone since he is on chronic narcotics and has some testicular atrophy. , - 05/10/2017 Incomplete bladder emptying, His PVR is 290 ml today. I discussed double voiding. - 05/10/2017 Prostate nodule w/ LUTS, He has a long history of voiding difficulty and remains on tamsulosin. He will need a flowrate and cystoscopy at some point. - 05/10/2017 Bladder Diverticulum, Diverticulum, bladder acquired - 2014 Bladder, Neuromuscular dysfunction, Unspec, Neurogenic bladder - 2014 Obstructive and reflux uropathy, Unspec, Obstructive uropathy - 2014 Personal Hx Oth male genital organs diseases, History of epididymitis - 2014 Urinary Tract Inf, Unspec site, Urinary tract infection - 2014      PMH Notes: Hiatal hernia.  lung cancer  Unspecified autoimmune disease.    NON-GU PMH: Anxiety Arthritis Duodenal ulcer, unspecified as acute or chronic, without hemorrhage or perforation Encounter for general adult medical examination without abnormal findings, Encounter for preventive health examination GERD Hypertension Other intervertebral disc degeneration, lumbosacral region Type 2 diabetes mellitus with diabetic neuropathy, unspecified    FAMILY HISTORY: Death In The Family Father - Runs In Family Death In The Family Mother - Runs In Family Family Health Status Number - Runs In Family   SOCIAL HISTORY: Marital Status: Single Preferred Language: English; Ethnicity: Not Hispanic Or  Latino; Race: Black or African American Current Smoking Status: Patient does not smoke anymore. Has not smoked since 04/26/2013. Smoked for 30 years. Smoked 1/2 pack per day.   Tobacco Use Assessment Completed: Used Tobacco in last 30 days? Has never drank.  Drinks 2 caffeinated drinks per day. Patient's occupation is/was retired.    REVIEW OF SYSTEMS:    GU Review Male:   Patient reports frequent urination, burning/ pain with urination, stream starts and stops, and have to strain to urinate . Patient denies hard to postpone urination, get up at night to urinate, leakage of urine, trouble starting your stream, erection problems, and penile pain.  Gastrointestinal (Upper):   Patient denies nausea, vomiting, and indigestion/ heartburn.  Gastrointestinal (Lower):   Patient denies diarrhea and constipation.  Constitutional:   Patient reports night sweats and fatigue. Patient denies fever and weight loss.  Skin:   Patient denies skin rash/ lesion  and itching.  Eyes:   Patient reports blurred vision. Patient denies double vision.  Ears/ Nose/ Throat:   Patient denies sore throat and sinus problems.  Hematologic/Lymphatic:   Patient denies swollen glands and easy bruising.  Cardiovascular:   Patient denies leg swelling and chest pains.  Respiratory:   Patient reports shortness of breath. Patient denies cough.  Endocrine:   Patient denies excessive thirst.  Musculoskeletal:   Patient reports joint pain. Patient denies back pain.  Neurological:   Patient denies dizziness and headaches.  Psychologic:   Patient reports depression and anxiety.    VITAL SIGNS:      07/26/2017 10:15 AM  Weight 252 lb / 114.31 kg  Height 80 in / 203.2 cm  BP 117/78 mmHg  Pulse 105 /min  Temperature 98.1 F / 36.7 C  BMI 27.7 kg/m   PAST DATA REVIEWED:  Source Of History:  Patient  Lab Test Review:   PSA, Total Testosterone  Records Review:   AUA Symptom Score  Urine Test Review:   Urinalysis   PROCEDURES:           Lupron Depot 45 mg / 6 Month - J9217, 52778 The lt. buttock was sterilely prepped with alcohol. Lupron was injected intramuscularly (IM) using standard technique. The patient tolerated the procedure well. A band aid was applied. The site was dry when the patient left the exam room. The patient will return as scheduled for his next Lupron injection.   Qty: 45 Adm. By: Iris Pert  Unit: mg Lot No [id number]  Route: IM Exp. Date 11/10/2019  Freq: None Mfgr.:   Site: Left Buttock   ASSESSMENT:      ICD-10 Details  1 GU:   Prostate Cancer - C61 He is doing well on ADT with improvement in his voiding symptoms. he has seen Dr. Tammi Klippel and needs to get set up for Fiducials and [place]. He was given Lupron 45mg  today and will return in 6 months with labs for his next injection. I will get a PSA and testosterone today since he didn't get that done yet.   2   Prostate nodule w/ LUTS - N40.3   3   Incomplete bladder emptying - R39.14      PLAN:           Orders Labs PSA, Total Testosterone          Schedule Labs: 6 Months - PSA    6 Months - Total Testosterone    6 Months - Urinalysis  Return Visit/Planned Activity: 6 Months - Office Visit, Lupron  Procedure: 07/26/2017 at [facility] - [phone] - Lupron Depot 45 mg / 6 Month (Depolupron 1 3 4  Month) - V4008, [id number]          Document Letter(s):  Created for Patient: Clinical Summary         Notes:   CC: Dr. Tula Nakayama.         Next Appointment:      Next Appointment: 02/07/2018 10:30 AM    Appointment Type: Office Visit Established Patient    Location: [facility] - [address]    Provider: [facility]    Reason for Visit: 6 mo followup w/ Labs \\T \ Lupron

## 2017-09-05 ENCOUNTER — Ambulatory Visit (HOSPITAL_BASED_OUTPATIENT_CLINIC_OR_DEPARTMENT_OTHER)
Admission: RE | Admit: 2017-09-05 | Discharge: 2017-09-05 | Disposition: A | Discharge status: Home / Self Care | Payer: Medicare Other | Source: Ambulatory Visit | Attending: Urology | Admitting: Urology

## 2017-09-05 ENCOUNTER — Other Ambulatory Visit: Payer: Self-pay

## 2017-09-05 ENCOUNTER — Ambulatory Visit (HOSPITAL_BASED_OUTPATIENT_CLINIC_OR_DEPARTMENT_OTHER): Payer: Medicare Other | Admitting: Anesthesiology

## 2017-09-05 ENCOUNTER — Encounter (HOSPITAL_BASED_OUTPATIENT_CLINIC_OR_DEPARTMENT_OTHER)
Admission: RE | Disposition: A | Discharge status: Home / Self Care | Payer: Self-pay | Source: Ambulatory Visit | Attending: Urology

## 2017-09-05 ENCOUNTER — Encounter (HOSPITAL_BASED_OUTPATIENT_CLINIC_OR_DEPARTMENT_OTHER): Payer: Self-pay | Admitting: *Deleted

## 2017-09-05 DIAGNOSIS — E1140 Type 2 diabetes mellitus with diabetic neuropathy, unspecified: Secondary | ICD-10-CM | POA: Insufficient documentation

## 2017-09-05 DIAGNOSIS — Z9049 Acquired absence of other specified parts of digestive tract: Secondary | ICD-10-CM | POA: Diagnosis not present

## 2017-09-05 DIAGNOSIS — C61 Malignant neoplasm of prostate: Secondary | ICD-10-CM | POA: Diagnosis not present

## 2017-09-05 DIAGNOSIS — Z902 Acquired absence of lung [part of]: Secondary | ICD-10-CM | POA: Insufficient documentation

## 2017-09-05 DIAGNOSIS — Z7952 Long term (current) use of systemic steroids: Secondary | ICD-10-CM | POA: Diagnosis not present

## 2017-09-05 DIAGNOSIS — Z906 Acquired absence of other parts of urinary tract: Secondary | ICD-10-CM | POA: Insufficient documentation

## 2017-09-05 DIAGNOSIS — J449 Chronic obstructive pulmonary disease, unspecified: Secondary | ICD-10-CM | POA: Insufficient documentation

## 2017-09-05 DIAGNOSIS — Z881 Allergy status to other antibiotic agents status: Secondary | ICD-10-CM | POA: Diagnosis not present

## 2017-09-05 DIAGNOSIS — E785 Hyperlipidemia, unspecified: Secondary | ICD-10-CM | POA: Diagnosis not present

## 2017-09-05 DIAGNOSIS — Z87891 Personal history of nicotine dependence: Secondary | ICD-10-CM | POA: Insufficient documentation

## 2017-09-05 DIAGNOSIS — I10 Essential (primary) hypertension: Secondary | ICD-10-CM | POA: Diagnosis not present

## 2017-09-05 DIAGNOSIS — Z882 Allergy status to sulfonamides status: Secondary | ICD-10-CM | POA: Diagnosis not present

## 2017-09-05 DIAGNOSIS — Z79899 Other long term (current) drug therapy: Secondary | ICD-10-CM | POA: Diagnosis not present

## 2017-09-05 DIAGNOSIS — K219 Gastro-esophageal reflux disease without esophagitis: Secondary | ICD-10-CM | POA: Diagnosis not present

## 2017-09-05 DIAGNOSIS — R339 Retention of urine, unspecified: Secondary | ICD-10-CM | POA: Diagnosis not present

## 2017-09-05 DIAGNOSIS — K449 Diaphragmatic hernia without obstruction or gangrene: Secondary | ICD-10-CM | POA: Insufficient documentation

## 2017-09-05 DIAGNOSIS — Z85118 Personal history of other malignant neoplasm of bronchus and lung: Secondary | ICD-10-CM | POA: Diagnosis not present

## 2017-09-05 HISTORY — DX: Dyspnea, unspecified: R06.00

## 2017-09-05 HISTORY — DX: Benign prostatic hyperplasia with lower urinary tract symptoms: N40.1

## 2017-09-05 HISTORY — DX: Contact with and (suspected) exposure to other war theater: Z77.39

## 2017-09-05 HISTORY — PX: SPACE OAR INSTILLATION: SHX6769

## 2017-09-05 HISTORY — DX: Abdominal aortic aneurysm, without rupture: I71.4

## 2017-09-05 HISTORY — DX: Malignant neoplasm of unspecified part of left bronchus or lung: C34.92

## 2017-09-05 HISTORY — DX: Calculus of kidney: N20.0

## 2017-09-05 HISTORY — DX: Personal history of other diseases of the circulatory system: Z86.79

## 2017-09-05 HISTORY — DX: Contact with and (suspected) exposure to other hazardous, chiefly nonmedicinal, chemicals: Z77.098

## 2017-09-05 HISTORY — DX: Other constipation: K59.09

## 2017-09-05 HISTORY — DX: Gout, unspecified: M10.9

## 2017-09-05 HISTORY — PX: GOLD SEED IMPLANT: SHX6343

## 2017-09-05 HISTORY — DX: Personal history of other diseases of the digestive system: Z87.19

## 2017-09-05 HISTORY — DX: Feeling of incomplete bladder emptying: R39.14

## 2017-09-05 HISTORY — DX: Diverticulum of bladder: N32.3

## 2017-09-05 HISTORY — DX: Presence of spectacles and contact lenses: Z97.3

## 2017-09-05 HISTORY — DX: Malignant neoplasm of unspecified part of right bronchus or lung: C34.91

## 2017-09-05 HISTORY — DX: Type 2 diabetes mellitus without complications: E11.9

## 2017-09-05 HISTORY — DX: Other forms of dyspnea: R06.09

## 2017-09-05 HISTORY — DX: Neuromuscular dysfunction of bladder, unspecified: N31.9

## 2017-09-05 HISTORY — DX: Malignant neoplasm of prostate: C61

## 2017-09-05 HISTORY — DX: Personal history of other venous thrombosis and embolism: Z86.718

## 2017-09-05 HISTORY — DX: Emphysema, unspecified: J43.9

## 2017-09-05 HISTORY — DX: Personal history of other diseases of urinary system: Z87.448

## 2017-09-05 HISTORY — DX: Chronic and other pulmonary manifestations due to radiation: J70.1

## 2017-09-05 HISTORY — DX: Personal history of irradiation: Z92.3

## 2017-09-05 HISTORY — DX: Type 2 diabetes mellitus with diabetic neuropathy, unspecified: E11.40

## 2017-09-05 HISTORY — DX: Infrarenal abdominal aortic aneurysm, without rupture: I71.43

## 2017-09-05 HISTORY — DX: Unspecified age-related cataract: H25.9

## 2017-09-05 LAB — POCT I-STAT 4, (NA,K, GLUC, HGB,HCT)
Glucose, Bld: 93 mg/dL (ref 65–99)
HCT: 35 % — ABNORMAL LOW (ref 39.0–52.0)
Hemoglobin: 11.9 g/dL — ABNORMAL LOW (ref 13.0–17.0)
Potassium: 3.6 mmol/L (ref 3.5–5.1)
Sodium: 141 mmol/L (ref 135–145)

## 2017-09-05 LAB — GLUCOSE, CAPILLARY: Glucose-Capillary: 95 mg/dL (ref 65–99)

## 2017-09-05 SURGERY — INSERTION, GOLD SEEDS
Anesthesia: General

## 2017-09-05 MED ORDER — MEPERIDINE HCL 25 MG/ML IJ SOLN
6.2500 mg | INTRAMUSCULAR | Status: DC | PRN
  Filled 2017-09-05: qty 1

## 2017-09-05 MED ORDER — ACETAMINOPHEN 650 MG RE SUPP
650.0000 mg | RECTAL | Status: DC | PRN
  Filled 2017-09-05: qty 1

## 2017-09-05 MED ORDER — CEFAZOLIN SODIUM-DEXTROSE 2-4 GM/100ML-% IV SOLN
INTRAVENOUS | Status: AC
  Filled 2017-09-05: qty 100

## 2017-09-05 MED ORDER — SODIUM CHLORIDE 0.9 % IV SOLN
250.0000 mL | INTRAVENOUS | Status: DC | PRN
  Filled 2017-09-05: qty 250

## 2017-09-05 MED ORDER — MORPHINE SULFATE (PF) 2 MG/ML IV SOLN
2.0000 mg | INTRAVENOUS | Status: DC | PRN
  Filled 2017-09-05: qty 1

## 2017-09-05 MED ORDER — LACTATED RINGERS IV SOLN
INTRAVENOUS | Status: DC
  Administered 2017-09-05: 11:00:00 via INTRAVENOUS
  Filled 2017-09-05: qty 1000

## 2017-09-05 MED ORDER — KETOROLAC TROMETHAMINE 30 MG/ML IJ SOLN
INTRAMUSCULAR | Status: DC | PRN
  Administered 2017-09-05: 30 mg via INTRAVENOUS

## 2017-09-05 MED ORDER — PROPOFOL 10 MG/ML IV BOLUS
INTRAVENOUS | Status: AC
  Filled 2017-09-05: qty 40

## 2017-09-05 MED ORDER — FENTANYL CITRATE (PF) 100 MCG/2ML IJ SOLN
INTRAMUSCULAR | Status: DC | PRN
  Administered 2017-09-05 (×4): 25 ug via INTRAVENOUS

## 2017-09-05 MED ORDER — PROPOFOL 10 MG/ML IV BOLUS
INTRAVENOUS | Status: DC | PRN
  Administered 2017-09-05: 50 mg via INTRAVENOUS
  Administered 2017-09-05: 200 mg via INTRAVENOUS

## 2017-09-05 MED ORDER — DEXAMETHASONE SODIUM PHOSPHATE 10 MG/ML IJ SOLN
INTRAMUSCULAR | Status: DC | PRN
  Administered 2017-09-05: 10 mg via INTRAVENOUS

## 2017-09-05 MED ORDER — SODIUM CHLORIDE 0.9 % IJ SOLN
INTRAMUSCULAR | Status: DC | PRN
  Administered 2017-09-05: 10 mL

## 2017-09-05 MED ORDER — FENTANYL CITRATE (PF) 100 MCG/2ML IJ SOLN
25.0000 ug | INTRAMUSCULAR | Status: DC | PRN
  Filled 2017-09-05: qty 1

## 2017-09-05 MED ORDER — SODIUM CHLORIDE 0.9% FLUSH
3.0000 mL | Freq: Two times a day (BID) | INTRAVENOUS | Status: DC
  Filled 2017-09-05: qty 3

## 2017-09-05 MED ORDER — FENTANYL CITRATE (PF) 100 MCG/2ML IJ SOLN
INTRAMUSCULAR | Status: AC
  Filled 2017-09-05: qty 2

## 2017-09-05 MED ORDER — ONDANSETRON HCL 4 MG/2ML IJ SOLN
INTRAMUSCULAR | Status: DC | PRN
  Administered 2017-09-05: 4 mg via INTRAVENOUS

## 2017-09-05 MED ORDER — CEFAZOLIN SODIUM-DEXTROSE 2-4 GM/100ML-% IV SOLN
2.0000 g | INTRAVENOUS | Status: AC
  Administered 2017-09-05: 2 g via INTRAVENOUS
  Filled 2017-09-05: qty 100

## 2017-09-05 MED ORDER — OXYCODONE HCL 5 MG PO TABS
5.0000 mg | ORAL_TABLET | ORAL | Status: DC | PRN
Start: 2017-09-05 — End: 2017-09-07
  Filled 2017-09-05: qty 2

## 2017-09-05 MED ORDER — ACETAMINOPHEN 325 MG PO TABS
650.0000 mg | ORAL_TABLET | ORAL | Status: DC | PRN
  Filled 2017-09-05: qty 2

## 2017-09-05 MED ORDER — SODIUM CHLORIDE 0.9% FLUSH
3.0000 mL | INTRAVENOUS | Status: DC | PRN
  Filled 2017-09-05: qty 3

## 2017-09-05 MED ORDER — LIDOCAINE 2% (20 MG/ML) 5 ML SYRINGE
INTRAMUSCULAR | Status: DC | PRN
  Administered 2017-09-05: 100 mg via INTRAVENOUS

## 2017-09-05 SURGICAL SUPPLY — 15 items
COVER TABLE BACK 60X90 (DRAPES) ×3 IMPLANT
DRSG TEGADERM 4X4.75 (GAUZE/BANDAGES/DRESSINGS) ×4 IMPLANT
DRSG TEGADERM 8X12 (GAUZE/BANDAGES/DRESSINGS) ×6 IMPLANT
GAUZE SPONGE 4X4 12PLY STRL (GAUZE/BANDAGES/DRESSINGS) ×2 IMPLANT
GLOVE ECLIPSE 8.0 STRL XLNG CF (GLOVE) ×3 IMPLANT
GLOVE SURG SS PI 8.0 STRL IVOR (GLOVE) ×3 IMPLANT
GOWN STRL REUS W/TWL XL LVL3 (GOWN DISPOSABLE) ×3 IMPLANT
IMPL SPACEOAR SYSTEM 10ML (MISCELLANEOUS) ×1 IMPLANT
IMPLANT SPACEOAR SYSTEM 10ML (MISCELLANEOUS) ×3
KIT TURNOVER CYSTO (KITS) ×3 IMPLANT
MARKER GOLD PRELOAD 1.2X3 (Urological Implant) ×1 IMPLANT
PACK CYSTO (CUSTOM PROCEDURE TRAY) ×3 IMPLANT
SEED GOLD PRELOAD 1.2X3 (Urological Implant) ×3 IMPLANT
SURGILUBE 2OZ TUBE FLIPTOP (MISCELLANEOUS) IMPLANT
UNDERPAD 30X30 (UNDERPADS AND DIAPERS) ×6 IMPLANT

## 2017-09-05 NOTE — Interval H&P Note (Signed)
History and Physical Interval Note:  09/05/2017 12:37 PM  Jonathan [name]  has presented today for surgery, with the diagnosis of PROSTATE CANCER  The various methods of treatment have been discussed with the patient and family. After consideration of risks, benefits and other options for treatment, the patient has consented to  Procedure(s): GOLD SEED IMPLANT (N/A) SPACE OAR INSTILLATION (N/A) as a surgical intervention .  The patient's history has been reviewed, patient examined, no change in status, stable for surgery.  I have reviewed the patient's chart and labs.  Questions were answered to the patient's satisfaction.     Irine Seal

## 2017-09-05 NOTE — Op Note (Signed)
Procedure: Ultrasound guided placement of fiducial markers and spaceoar.    Preop diagnosis: Prostate cancer.  Postop diagnosis: Prostate cancer.  Surgeon: Dr. Irine Seal.  Radiation oncologist: Dr. Ledon Snare.  Anesthesia: General.  EBL: None.  Drain: None.  Specimen: None.  Complications: None.  Indications: Jonathan [name] is a 77 year old male with high risk prostate cancer who is having placement of gold fiducial markers and spaceoar gel today in preparation for external beam radiation therapy.  Procedure: He was given 2 g of Ancef.  He was taken operating room where general anesthetic was induced.  He was placed in the lithotomy position.  His perineum was prepped with Betadine solution.  The ultrasound probe was assembled and secured to the fixed base with stepper and inserted rectally.  Once the prostate image was satisfactory and the probe positioned in the midline the gold fiducial markers were placed.  The initial marker was passed through a puncture to the right of midline into the base of the prostate on the right.  The second gold fiducial marker was placed into the right apical prostate and the third was then placed through a left puncture  into the left lateral prostate.  The spaceoar needle was then passed in the midline and angled down through the rectourethralis muscle under ultrasound guidance into the fat plane between the prostate and rectum.  The initial saline injection suggested that the tip of the needle was in the outer layer of the rectal wall so the needle was repositioned until it was in the posterior fat plane which was quite thin on ultrasound.  At this point saline entered the appropriate space so the saline needle was disconnected and the space or polymer syringes were connected and injected over a 10-second interval with good distribution.  There appeared to be at most minimal infiltration of the outer layer of the rectal wall on the right, but the  overall distribution provided good separation from the prostate and the rectum.  The ultrasound probe was removed.  The patient was taken down from the lithotomy position, his anesthetic was reversed and he was moved to the recovery room in stable condition.  There were no complications.  CC: Dr. Corrie Dandy with [facility].

## 2017-09-05 NOTE — Anesthesia Postprocedure Evaluation (Signed)
Anesthesia Post Note  Patient: Jonathan [name]  Procedure(s) Performed: GOLD SEED IMPLANT (N/A ) SPACE OAR INSTILLATION (N/A )     Patient location during evaluation: PACU Anesthesia Type: General Level of consciousness: awake and alert Pain management: pain level controlled Vital Signs Assessment: post-procedure vital signs reviewed and stable Respiratory status: spontaneous breathing, nonlabored ventilation, respiratory function stable and patient connected to nasal cannula oxygen Cardiovascular status: blood pressure returned to baseline and stable Postop Assessment: no apparent nausea or vomiting Anesthetic complications: no    Last Vitals:  Vitals:   09/05/17 1339 09/05/17 1345  BP: 124/72 113/66  Pulse: 64 (!) 59  Resp: 16 11  Temp: 36.8 C   SpO2: 100% 99%    Last Pain:  Vitals:   09/05/17 1339  TempSrc:   PainSc: Asleep                 Kirtan Sada

## 2017-09-05 NOTE — Anesthesia Preprocedure Evaluation (Addendum)
Anesthesia Evaluation  Patient identified by MRN, date of birth, ID band Patient awake    Reviewed: Allergy & Precautions, NPO status , Patient's Chart, lab work & pertinent test results  Airway Mallampati: I  TM Distance: >3 FB Neck ROM: full    Dental  (+) Dental Advidsory Given, Missing, Loose, Poor Dentition   Pulmonary shortness of breath and with exertion, COPD, former smoker,    breath sounds clear to auscultation       Cardiovascular hypertension, On Medications, Pt. on medications and Pt. on home beta blockers + Peripheral Vascular Disease   Rhythm:regular Rate:Normal     Neuro/Psych PSYCHIATRIC DISORDERS Anxiety Depression  Neuromuscular disease    GI/Hepatic hiatal hernia, GERD  Medicated and Controlled,  Endo/Other  diabetes, Type 2obese  Renal/GU      Musculoskeletal  (+) Arthritis ,   Abdominal   Peds  Hematology   Anesthesia Other Findings Study Conclusions  - Left ventricle: The cavity size was normal. Systolic function was normal. The estimated ejection fraction was in the range of 55% to 60%. Wall motion was normal; there were no regional wall motion abnormalities. Doppler parameters are consistent with abnormal left ventricular relaxation (grade 1 diastolic dysfunction). - Mitral valve: Mild regurgitation. - Right ventricle: The cavity size was mildly dilated. Wall thickness was normal.  Reproductive/Obstetrics                             Anesthesia Physical  Anesthesia Plan  ASA: III  Anesthesia Plan: General   Post-op Pain Management:    Induction: Intravenous  PONV Risk Score and Plan: 1 and Ondansetron and Treatment may vary due to age or medical condition  Airway Management Planned: Oral ETT and LMA  Additional Equipment:   Intra-op Plan:   Post-operative Plan: Extubation in OR  Informed Consent: I have reviewed the patients History  and Physical, chart, labs and discussed the procedure including the risks, benefits and alternatives for the proposed anesthesia with the patient or authorized representative who has indicated his/her understanding and acceptance.   Dental Advisory Given  Plan Discussed with: Anesthesiologist, CRNA and Surgeon  Anesthesia Plan Comments:         Anesthesia Quick Evaluation

## 2017-09-05 NOTE — Anesthesia Procedure Notes (Addendum)
Procedure Name: LMA Insertion Date/Time: 09/05/2017 1:06 PM Performed by: Justice Rocher, CRNA Pre-anesthesia Checklist: Patient identified, Emergency Drugs available, Suction available and Patient being monitored Patient Re-evaluated:Patient Re-evaluated prior to induction Oxygen Delivery Method: Circle system utilized Preoxygenation: Pre-oxygenation with 100% oxygen Induction Type: IV induction Ventilation: Mask ventilation without difficulty LMA: LMA inserted LMA Size: 5.0 Number of attempts: 1 Airway Equipment and Method: Bite block Placement Confirmation: positive ETCO2 and breath sounds checked- equal and bilateral Tube secured with: Tape Dental Injury: Teeth and Oropharynx as per pre-operative assessment

## 2017-09-05 NOTE — Transfer of Care (Signed)
Immediate Anesthesia Transfer of Care Note  Patient: Jonathan [name]  Procedure(s) Performed: Procedure(s) (LRB): GOLD SEED IMPLANT (N/A) SPACE OAR INSTILLATION (N/A)  Patient Location: PACU  Anesthesia Type: General  Level of Consciousness: awake, sedated, patient cooperative and responds to stimulation  Airway & Oxygen Therapy: Patient Spontanous Breathing and Patient connected to NCO2  Post-op Assessment: Report given to PACU RN, Post -op Vital signs reviewed and stable and Patient moving all extremities  Post vital signs: Reviewed and stable  Complications: No apparent anesthesia complications

## 2017-09-05 NOTE — Discharge Instructions (Addendum)
Call for heavy bleeding, pain not controlled by meds or fever > 101 degrees.  May remove dressing tomorrow and shower.   Post Anesthesia Home Care Instructions  Activity: Get plenty of rest for the remainder of the day. A responsible individual must stay with you for 24 hours following the procedure.  For the next 24 hours, DO NOT: -Drive a car -Paediatric nurse -Drink alcoholic beverages -Take any medication unless instructed by your physician -Make any legal decisions or sign important papers.  Meals: Start with liquid foods such as gelatin or soup. Progress to regular foods as tolerated. Avoid greasy, spicy, heavy foods. If nausea and/or vomiting occur, drink only clear liquids until the nausea and/or vomiting subsides. Call your physician if vomiting continues.  Special Instructions/Symptoms: Your throat may feel dry or sore from the anesthesia or the breathing tube placed in your throat during surgery. If this causes discomfort, gargle with warm salt water. The discomfort should disappear within 24 hours.

## 2017-09-06 ENCOUNTER — Encounter (HOSPITAL_BASED_OUTPATIENT_CLINIC_OR_DEPARTMENT_OTHER): Payer: Self-pay | Admitting: Urology

## 2017-09-10 ENCOUNTER — Ambulatory Visit: Admission: RE | Admit: 2017-09-10 | Payer: Medicare Other | Source: Ambulatory Visit | Admitting: Radiation Oncology

## 2017-09-10 ENCOUNTER — Encounter: Payer: Self-pay | Admitting: Urology

## 2017-09-10 ENCOUNTER — Telehealth: Payer: Self-pay | Admitting: Medical Oncology

## 2017-09-10 ENCOUNTER — Encounter: Payer: Self-pay | Admitting: Medical Oncology

## 2017-09-10 NOTE — Telephone Encounter (Signed)
Spoke with patient to let him know Dr. Jeffie Pollock made the referral to [facility] for radiation and we did not get this information. I again apologized for the inconvenience of coming for an appointment he did not need. He states he enjoyed seeing the [facility] team. He has appointment 09/12/17 for CT simulation at [facility]. He has some paperwork/letter he needs to be completed regarding his treatment. I asked him to take to [facility] or Dr. Jeffie Pollock to complete since he will not be receiving treatment here. He voiced understanding.

## 2017-09-10 NOTE — Progress Notes (Signed)
Patient presented for CT Sim prostate today however, informs [name] that he is scheduled for CT simulation prostate on 09/12/2017 at [facility] in preparation to begin prostate IMRT in [place], [place] which is much closer to home.  He has apparently already consulted with the radiation oncologist in [place] and is scheduled for the CT simulation on Thursday, 09/12/2017 but we were not aware.  I apologize for the inconvenience and explained that he does not need a CT simulation appointment here at our cancer center if he is planning to undergo treatment in [place].  He confirms that he is indeed planning to have his treatment in [place], [place].  I advised him to keep his scheduled appointment for CT simulation on Thursday, 09/12/2017 at [facility] and they would handle any further scheduling/procedures in preparation for his treatment.  We will cancel his scheduled MRI prostate for Friday, 09/13/2017 as this will also be coordinated through [facility] at the treating physician's discretion. I apologized to the patient for the inconvenience of today's appointment which should have been canceled at the time of the referral was made to consult in [place]. He does wish to continue his follow-up for his early stage lung cancer here in [place]. -Betzy Barbier

## 2017-09-10 NOTE — Progress Notes (Signed)
Jonathan [name] will be receiving his radiation at [place].

## 2017-09-11 DIAGNOSIS — Z510 Encounter for antineoplastic radiation therapy: Secondary | ICD-10-CM | POA: Diagnosis not present

## 2017-09-11 DIAGNOSIS — C61 Malignant neoplasm of prostate: Secondary | ICD-10-CM | POA: Diagnosis not present

## 2017-09-12 ENCOUNTER — Ambulatory Visit: Payer: Medicare Other | Admitting: Radiation Oncology

## 2017-09-12 DIAGNOSIS — H401113 Primary open-angle glaucoma, right eye, severe stage: Secondary | ICD-10-CM | POA: Diagnosis not present

## 2017-09-13 ENCOUNTER — Ambulatory Visit: Payer: Medicare Other | Admitting: Radiation Oncology

## 2017-09-13 ENCOUNTER — Ambulatory Visit (HOSPITAL_COMMUNITY): Payer: Medicare Other

## 2017-09-13 DIAGNOSIS — Z9689 Presence of other specified functional implants: Secondary | ICD-10-CM | POA: Diagnosis not present

## 2017-09-13 DIAGNOSIS — C61 Malignant neoplasm of prostate: Secondary | ICD-10-CM | POA: Diagnosis not present

## 2017-09-16 DIAGNOSIS — E1140 Type 2 diabetes mellitus with diabetic neuropathy, unspecified: Secondary | ICD-10-CM | POA: Diagnosis not present

## 2017-09-16 DIAGNOSIS — Z85118 Personal history of other malignant neoplasm of bronchus and lung: Secondary | ICD-10-CM | POA: Diagnosis not present

## 2017-09-16 DIAGNOSIS — I10 Essential (primary) hypertension: Secondary | ICD-10-CM | POA: Diagnosis not present

## 2017-09-16 DIAGNOSIS — Z87891 Personal history of nicotine dependence: Secondary | ICD-10-CM | POA: Diagnosis not present

## 2017-09-16 DIAGNOSIS — N179 Acute kidney failure, unspecified: Secondary | ICD-10-CM | POA: Diagnosis not present

## 2017-09-16 DIAGNOSIS — Z8546 Personal history of malignant neoplasm of prostate: Secondary | ICD-10-CM | POA: Diagnosis not present

## 2017-09-16 DIAGNOSIS — J449 Chronic obstructive pulmonary disease, unspecified: Secondary | ICD-10-CM | POA: Diagnosis not present

## 2017-09-16 DIAGNOSIS — H8149 Vertigo of central origin, unspecified ear: Secondary | ICD-10-CM | POA: Diagnosis not present

## 2017-09-16 DIAGNOSIS — R42 Dizziness and giddiness: Secondary | ICD-10-CM | POA: Diagnosis not present

## 2017-09-16 DIAGNOSIS — Z79899 Other long term (current) drug therapy: Secondary | ICD-10-CM | POA: Diagnosis not present

## 2017-09-16 DIAGNOSIS — K219 Gastro-esophageal reflux disease without esophagitis: Secondary | ICD-10-CM | POA: Diagnosis not present

## 2017-09-17 DIAGNOSIS — E0865 Diabetes mellitus due to underlying condition with hyperglycemia: Secondary | ICD-10-CM | POA: Diagnosis not present

## 2017-09-17 DIAGNOSIS — E1143 Type 2 diabetes mellitus with diabetic autonomic (poly)neuropathy: Secondary | ICD-10-CM | POA: Diagnosis not present

## 2017-09-17 DIAGNOSIS — Z681 Body mass index (BMI) 19 or less, adult: Secondary | ICD-10-CM | POA: Diagnosis not present

## 2017-09-17 DIAGNOSIS — R42 Dizziness and giddiness: Secondary | ICD-10-CM | POA: Diagnosis not present

## 2017-09-20 DIAGNOSIS — C61 Malignant neoplasm of prostate: Secondary | ICD-10-CM | POA: Diagnosis not present

## 2017-09-20 DIAGNOSIS — Z510 Encounter for antineoplastic radiation therapy: Secondary | ICD-10-CM | POA: Diagnosis not present

## 2017-09-23 DIAGNOSIS — Z510 Encounter for antineoplastic radiation therapy: Secondary | ICD-10-CM | POA: Diagnosis not present

## 2017-09-23 DIAGNOSIS — C61 Malignant neoplasm of prostate: Secondary | ICD-10-CM | POA: Diagnosis not present

## 2017-09-24 DIAGNOSIS — Z510 Encounter for antineoplastic radiation therapy: Secondary | ICD-10-CM | POA: Diagnosis not present

## 2017-09-24 DIAGNOSIS — C61 Malignant neoplasm of prostate: Secondary | ICD-10-CM | POA: Diagnosis not present

## 2017-09-25 DIAGNOSIS — M1990 Unspecified osteoarthritis, unspecified site: Secondary | ICD-10-CM | POA: Diagnosis not present

## 2017-09-25 DIAGNOSIS — R300 Dysuria: Secondary | ICD-10-CM | POA: Diagnosis not present

## 2017-09-25 DIAGNOSIS — J449 Chronic obstructive pulmonary disease, unspecified: Secondary | ICD-10-CM | POA: Diagnosis not present

## 2017-09-25 DIAGNOSIS — Z85118 Personal history of other malignant neoplasm of bronchus and lung: Secondary | ICD-10-CM | POA: Diagnosis not present

## 2017-09-25 DIAGNOSIS — I10 Essential (primary) hypertension: Secondary | ICD-10-CM | POA: Diagnosis not present

## 2017-09-25 DIAGNOSIS — Z794 Long term (current) use of insulin: Secondary | ICD-10-CM | POA: Diagnosis not present

## 2017-09-25 DIAGNOSIS — Z79899 Other long term (current) drug therapy: Secondary | ICD-10-CM | POA: Diagnosis not present

## 2017-09-25 DIAGNOSIS — R1032 Left lower quadrant pain: Secondary | ICD-10-CM | POA: Diagnosis not present

## 2017-09-25 DIAGNOSIS — C61 Malignant neoplasm of prostate: Secondary | ICD-10-CM | POA: Diagnosis not present

## 2017-09-25 DIAGNOSIS — Z510 Encounter for antineoplastic radiation therapy: Secondary | ICD-10-CM | POA: Diagnosis not present

## 2017-09-25 DIAGNOSIS — Z87891 Personal history of nicotine dependence: Secondary | ICD-10-CM | POA: Diagnosis not present

## 2017-09-25 DIAGNOSIS — R339 Retention of urine, unspecified: Secondary | ICD-10-CM | POA: Diagnosis not present

## 2017-09-25 DIAGNOSIS — R1031 Right lower quadrant pain: Secondary | ICD-10-CM | POA: Diagnosis not present

## 2017-09-25 DIAGNOSIS — E119 Type 2 diabetes mellitus without complications: Secondary | ICD-10-CM | POA: Diagnosis not present

## 2017-09-25 DIAGNOSIS — K219 Gastro-esophageal reflux disease without esophagitis: Secondary | ICD-10-CM | POA: Diagnosis not present

## 2017-09-27 DIAGNOSIS — Z510 Encounter for antineoplastic radiation therapy: Secondary | ICD-10-CM | POA: Diagnosis not present

## 2017-09-27 DIAGNOSIS — C61 Malignant neoplasm of prostate: Secondary | ICD-10-CM | POA: Diagnosis not present

## 2017-09-30 DIAGNOSIS — Z510 Encounter for antineoplastic radiation therapy: Secondary | ICD-10-CM | POA: Diagnosis not present

## 2017-09-30 DIAGNOSIS — C61 Malignant neoplasm of prostate: Secondary | ICD-10-CM | POA: Diagnosis not present

## 2017-10-01 DIAGNOSIS — C61 Malignant neoplasm of prostate: Secondary | ICD-10-CM | POA: Diagnosis not present

## 2017-10-01 DIAGNOSIS — Z510 Encounter for antineoplastic radiation therapy: Secondary | ICD-10-CM | POA: Diagnosis not present

## 2017-10-02 DIAGNOSIS — C61 Malignant neoplasm of prostate: Secondary | ICD-10-CM | POA: Diagnosis not present

## 2017-10-02 DIAGNOSIS — Z510 Encounter for antineoplastic radiation therapy: Secondary | ICD-10-CM | POA: Diagnosis not present

## 2017-10-03 DIAGNOSIS — C61 Malignant neoplasm of prostate: Secondary | ICD-10-CM | POA: Diagnosis not present

## 2017-10-03 DIAGNOSIS — Z510 Encounter for antineoplastic radiation therapy: Secondary | ICD-10-CM | POA: Diagnosis not present

## 2017-10-04 DIAGNOSIS — Z510 Encounter for antineoplastic radiation therapy: Secondary | ICD-10-CM | POA: Diagnosis not present

## 2017-10-04 DIAGNOSIS — C61 Malignant neoplasm of prostate: Secondary | ICD-10-CM | POA: Diagnosis not present

## 2017-10-07 DIAGNOSIS — C61 Malignant neoplasm of prostate: Secondary | ICD-10-CM | POA: Diagnosis not present

## 2017-10-07 DIAGNOSIS — Z510 Encounter for antineoplastic radiation therapy: Secondary | ICD-10-CM | POA: Diagnosis not present

## 2017-10-08 DIAGNOSIS — C61 Malignant neoplasm of prostate: Secondary | ICD-10-CM | POA: Diagnosis not present

## 2017-10-08 DIAGNOSIS — Z510 Encounter for antineoplastic radiation therapy: Secondary | ICD-10-CM | POA: Diagnosis not present

## 2017-10-09 DIAGNOSIS — R319 Hematuria, unspecified: Secondary | ICD-10-CM | POA: Diagnosis not present

## 2017-10-09 DIAGNOSIS — Z510 Encounter for antineoplastic radiation therapy: Secondary | ICD-10-CM | POA: Diagnosis not present

## 2017-10-09 DIAGNOSIS — C61 Malignant neoplasm of prostate: Secondary | ICD-10-CM | POA: Diagnosis not present

## 2017-10-10 DIAGNOSIS — C61 Malignant neoplasm of prostate: Secondary | ICD-10-CM | POA: Diagnosis not present

## 2017-10-10 DIAGNOSIS — Z510 Encounter for antineoplastic radiation therapy: Secondary | ICD-10-CM | POA: Diagnosis not present

## 2017-10-11 DIAGNOSIS — Z510 Encounter for antineoplastic radiation therapy: Secondary | ICD-10-CM | POA: Diagnosis not present

## 2017-10-11 DIAGNOSIS — C61 Malignant neoplasm of prostate: Secondary | ICD-10-CM | POA: Diagnosis not present

## 2017-10-14 DIAGNOSIS — C61 Malignant neoplasm of prostate: Secondary | ICD-10-CM | POA: Diagnosis not present

## 2017-10-14 DIAGNOSIS — Z510 Encounter for antineoplastic radiation therapy: Secondary | ICD-10-CM | POA: Diagnosis not present

## 2017-10-15 DIAGNOSIS — Z510 Encounter for antineoplastic radiation therapy: Secondary | ICD-10-CM | POA: Diagnosis not present

## 2017-10-15 DIAGNOSIS — C61 Malignant neoplasm of prostate: Secondary | ICD-10-CM | POA: Diagnosis not present

## 2017-10-16 DIAGNOSIS — Z1389 Encounter for screening for other disorder: Secondary | ICD-10-CM | POA: Diagnosis not present

## 2017-10-16 DIAGNOSIS — Z1331 Encounter for screening for depression: Secondary | ICD-10-CM | POA: Diagnosis not present

## 2017-10-16 DIAGNOSIS — E0865 Diabetes mellitus due to underlying condition with hyperglycemia: Secondary | ICD-10-CM | POA: Diagnosis not present

## 2017-10-16 DIAGNOSIS — C61 Malignant neoplasm of prostate: Secondary | ICD-10-CM | POA: Diagnosis not present

## 2017-10-16 DIAGNOSIS — E1143 Type 2 diabetes mellitus with diabetic autonomic (poly)neuropathy: Secondary | ICD-10-CM | POA: Diagnosis not present

## 2017-10-16 DIAGNOSIS — Z6828 Body mass index (BMI) 28.0-28.9, adult: Secondary | ICD-10-CM | POA: Diagnosis not present

## 2017-10-16 DIAGNOSIS — Z510 Encounter for antineoplastic radiation therapy: Secondary | ICD-10-CM | POA: Diagnosis not present

## 2017-10-17 DIAGNOSIS — C61 Malignant neoplasm of prostate: Secondary | ICD-10-CM | POA: Diagnosis not present

## 2017-10-17 DIAGNOSIS — Z510 Encounter for antineoplastic radiation therapy: Secondary | ICD-10-CM | POA: Diagnosis not present

## 2017-10-18 DIAGNOSIS — C61 Malignant neoplasm of prostate: Secondary | ICD-10-CM | POA: Diagnosis not present

## 2017-10-18 DIAGNOSIS — Z510 Encounter for antineoplastic radiation therapy: Secondary | ICD-10-CM | POA: Diagnosis not present

## 2017-10-21 DIAGNOSIS — C61 Malignant neoplasm of prostate: Secondary | ICD-10-CM | POA: Diagnosis not present

## 2017-10-21 DIAGNOSIS — Z510 Encounter for antineoplastic radiation therapy: Secondary | ICD-10-CM | POA: Diagnosis not present

## 2017-10-22 DIAGNOSIS — C61 Malignant neoplasm of prostate: Secondary | ICD-10-CM | POA: Diagnosis not present

## 2017-10-22 DIAGNOSIS — Z510 Encounter for antineoplastic radiation therapy: Secondary | ICD-10-CM | POA: Diagnosis not present

## 2017-10-23 DIAGNOSIS — Z510 Encounter for antineoplastic radiation therapy: Secondary | ICD-10-CM | POA: Diagnosis not present

## 2017-10-23 DIAGNOSIS — C61 Malignant neoplasm of prostate: Secondary | ICD-10-CM | POA: Diagnosis not present

## 2017-10-24 DIAGNOSIS — Z510 Encounter for antineoplastic radiation therapy: Secondary | ICD-10-CM | POA: Diagnosis not present

## 2017-10-24 DIAGNOSIS — C61 Malignant neoplasm of prostate: Secondary | ICD-10-CM | POA: Diagnosis not present

## 2017-10-25 DIAGNOSIS — Z510 Encounter for antineoplastic radiation therapy: Secondary | ICD-10-CM | POA: Diagnosis not present

## 2017-10-25 DIAGNOSIS — C61 Malignant neoplasm of prostate: Secondary | ICD-10-CM | POA: Diagnosis not present

## 2017-10-28 DIAGNOSIS — C61 Malignant neoplasm of prostate: Secondary | ICD-10-CM | POA: Diagnosis not present

## 2017-10-28 DIAGNOSIS — Z510 Encounter for antineoplastic radiation therapy: Secondary | ICD-10-CM | POA: Diagnosis not present

## 2017-10-29 DIAGNOSIS — C61 Malignant neoplasm of prostate: Secondary | ICD-10-CM | POA: Diagnosis not present

## 2017-10-29 DIAGNOSIS — Z510 Encounter for antineoplastic radiation therapy: Secondary | ICD-10-CM | POA: Diagnosis not present

## 2017-10-30 DIAGNOSIS — R109 Unspecified abdominal pain: Secondary | ICD-10-CM | POA: Diagnosis not present

## 2017-10-30 DIAGNOSIS — J449 Chronic obstructive pulmonary disease, unspecified: Secondary | ICD-10-CM | POA: Diagnosis not present

## 2017-10-30 DIAGNOSIS — T83021A Displacement of indwelling urethral catheter, initial encounter: Secondary | ICD-10-CM | POA: Diagnosis not present

## 2017-10-30 DIAGNOSIS — Z510 Encounter for antineoplastic radiation therapy: Secondary | ICD-10-CM | POA: Diagnosis not present

## 2017-10-30 DIAGNOSIS — I10 Essential (primary) hypertension: Secondary | ICD-10-CM | POA: Diagnosis not present

## 2017-10-30 DIAGNOSIS — C61 Malignant neoplasm of prostate: Secondary | ICD-10-CM | POA: Diagnosis not present

## 2017-10-30 DIAGNOSIS — M545 Low back pain: Secondary | ICD-10-CM | POA: Diagnosis not present

## 2017-10-30 DIAGNOSIS — Z87891 Personal history of nicotine dependence: Secondary | ICD-10-CM | POA: Diagnosis not present

## 2017-10-30 DIAGNOSIS — R339 Retention of urine, unspecified: Secondary | ICD-10-CM | POA: Diagnosis not present

## 2017-10-31 DIAGNOSIS — Z510 Encounter for antineoplastic radiation therapy: Secondary | ICD-10-CM | POA: Diagnosis not present

## 2017-10-31 DIAGNOSIS — C61 Malignant neoplasm of prostate: Secondary | ICD-10-CM | POA: Diagnosis not present

## 2017-11-01 DIAGNOSIS — Z510 Encounter for antineoplastic radiation therapy: Secondary | ICD-10-CM | POA: Diagnosis not present

## 2017-11-01 DIAGNOSIS — C61 Malignant neoplasm of prostate: Secondary | ICD-10-CM | POA: Diagnosis not present

## 2017-11-03 DIAGNOSIS — E1140 Type 2 diabetes mellitus with diabetic neuropathy, unspecified: Secondary | ICD-10-CM | POA: Diagnosis not present

## 2017-11-03 DIAGNOSIS — I10 Essential (primary) hypertension: Secondary | ICD-10-CM | POA: Diagnosis not present

## 2017-11-03 DIAGNOSIS — J984 Other disorders of lung: Secondary | ICD-10-CM | POA: Diagnosis not present

## 2017-11-03 DIAGNOSIS — Z8546 Personal history of malignant neoplasm of prostate: Secondary | ICD-10-CM | POA: Diagnosis not present

## 2017-11-03 DIAGNOSIS — R600 Localized edema: Secondary | ICD-10-CM | POA: Diagnosis not present

## 2017-11-03 DIAGNOSIS — K219 Gastro-esophageal reflux disease without esophagitis: Secondary | ICD-10-CM | POA: Diagnosis not present

## 2017-11-03 DIAGNOSIS — Z79899 Other long term (current) drug therapy: Secondary | ICD-10-CM | POA: Diagnosis not present

## 2017-11-03 DIAGNOSIS — R339 Retention of urine, unspecified: Secondary | ICD-10-CM | POA: Diagnosis not present

## 2017-11-03 DIAGNOSIS — N3289 Other specified disorders of bladder: Secondary | ICD-10-CM | POA: Diagnosis not present

## 2017-11-03 DIAGNOSIS — M109 Gout, unspecified: Secondary | ICD-10-CM | POA: Diagnosis not present

## 2017-11-03 DIAGNOSIS — F329 Major depressive disorder, single episode, unspecified: Secondary | ICD-10-CM | POA: Diagnosis not present

## 2017-11-03 DIAGNOSIS — J449 Chronic obstructive pulmonary disease, unspecified: Secondary | ICD-10-CM | POA: Diagnosis not present

## 2017-11-03 DIAGNOSIS — Z882 Allergy status to sulfonamides status: Secondary | ICD-10-CM | POA: Diagnosis not present

## 2017-11-03 DIAGNOSIS — Z881 Allergy status to other antibiotic agents status: Secondary | ICD-10-CM | POA: Diagnosis not present

## 2017-11-03 DIAGNOSIS — Z87891 Personal history of nicotine dependence: Secondary | ICD-10-CM | POA: Diagnosis not present

## 2017-11-03 DIAGNOSIS — Z85118 Personal history of other malignant neoplasm of bronchus and lung: Secondary | ICD-10-CM | POA: Diagnosis not present

## 2017-11-03 DIAGNOSIS — Z923 Personal history of irradiation: Secondary | ICD-10-CM | POA: Diagnosis not present

## 2017-11-05 DIAGNOSIS — C61 Malignant neoplasm of prostate: Secondary | ICD-10-CM | POA: Diagnosis not present

## 2017-11-05 DIAGNOSIS — Z510 Encounter for antineoplastic radiation therapy: Secondary | ICD-10-CM | POA: Diagnosis not present

## 2017-11-06 DIAGNOSIS — C61 Malignant neoplasm of prostate: Secondary | ICD-10-CM | POA: Diagnosis not present

## 2017-11-06 DIAGNOSIS — Z510 Encounter for antineoplastic radiation therapy: Secondary | ICD-10-CM | POA: Diagnosis not present

## 2017-11-07 DIAGNOSIS — Z510 Encounter for antineoplastic radiation therapy: Secondary | ICD-10-CM | POA: Diagnosis not present

## 2017-11-07 DIAGNOSIS — C61 Malignant neoplasm of prostate: Secondary | ICD-10-CM | POA: Diagnosis not present

## 2017-11-08 DIAGNOSIS — C61 Malignant neoplasm of prostate: Secondary | ICD-10-CM | POA: Diagnosis not present

## 2017-11-08 DIAGNOSIS — Z510 Encounter for antineoplastic radiation therapy: Secondary | ICD-10-CM | POA: Diagnosis not present

## 2017-11-11 DIAGNOSIS — Z510 Encounter for antineoplastic radiation therapy: Secondary | ICD-10-CM | POA: Diagnosis not present

## 2017-11-11 DIAGNOSIS — C61 Malignant neoplasm of prostate: Secondary | ICD-10-CM | POA: Diagnosis not present

## 2017-11-12 DIAGNOSIS — Z510 Encounter for antineoplastic radiation therapy: Secondary | ICD-10-CM | POA: Diagnosis not present

## 2017-11-12 DIAGNOSIS — C61 Malignant neoplasm of prostate: Secondary | ICD-10-CM | POA: Diagnosis not present

## 2017-11-13 DIAGNOSIS — M25571 Pain in right ankle and joints of right foot: Secondary | ICD-10-CM | POA: Diagnosis not present

## 2017-11-13 DIAGNOSIS — R300 Dysuria: Secondary | ICD-10-CM | POA: Diagnosis not present

## 2017-11-13 DIAGNOSIS — E0865 Diabetes mellitus due to underlying condition with hyperglycemia: Secondary | ICD-10-CM | POA: Diagnosis not present

## 2017-11-13 DIAGNOSIS — Z510 Encounter for antineoplastic radiation therapy: Secondary | ICD-10-CM | POA: Diagnosis not present

## 2017-11-13 DIAGNOSIS — E1143 Type 2 diabetes mellitus with diabetic autonomic (poly)neuropathy: Secondary | ICD-10-CM | POA: Diagnosis not present

## 2017-11-13 DIAGNOSIS — K5901 Slow transit constipation: Secondary | ICD-10-CM | POA: Diagnosis not present

## 2017-11-13 DIAGNOSIS — R1084 Generalized abdominal pain: Secondary | ICD-10-CM | POA: Diagnosis not present

## 2017-11-13 DIAGNOSIS — G5621 Lesion of ulnar nerve, right upper limb: Secondary | ICD-10-CM | POA: Diagnosis not present

## 2017-11-13 DIAGNOSIS — I10 Essential (primary) hypertension: Secondary | ICD-10-CM | POA: Diagnosis not present

## 2017-11-13 DIAGNOSIS — R413 Other amnesia: Secondary | ICD-10-CM | POA: Diagnosis not present

## 2017-11-13 DIAGNOSIS — R972 Elevated prostate specific antigen [PSA]: Secondary | ICD-10-CM | POA: Diagnosis not present

## 2017-11-13 DIAGNOSIS — R531 Weakness: Secondary | ICD-10-CM | POA: Diagnosis not present

## 2017-11-13 DIAGNOSIS — C61 Malignant neoplasm of prostate: Secondary | ICD-10-CM | POA: Diagnosis not present

## 2017-11-14 DIAGNOSIS — C61 Malignant neoplasm of prostate: Secondary | ICD-10-CM | POA: Diagnosis not present

## 2017-11-14 DIAGNOSIS — Z510 Encounter for antineoplastic radiation therapy: Secondary | ICD-10-CM | POA: Diagnosis not present

## 2017-11-15 ENCOUNTER — Ambulatory Visit (INDEPENDENT_AMBULATORY_CARE_PROVIDER_SITE_OTHER): Payer: Medicare Other | Admitting: Urology

## 2017-11-15 DIAGNOSIS — C61 Malignant neoplasm of prostate: Secondary | ICD-10-CM

## 2017-11-15 DIAGNOSIS — R338 Other retention of urine: Secondary | ICD-10-CM | POA: Diagnosis not present

## 2017-11-15 DIAGNOSIS — Z510 Encounter for antineoplastic radiation therapy: Secondary | ICD-10-CM | POA: Diagnosis not present

## 2017-11-16 DIAGNOSIS — R339 Retention of urine, unspecified: Secondary | ICD-10-CM | POA: Diagnosis not present

## 2017-11-16 DIAGNOSIS — T83098A Other mechanical complication of other indwelling urethral catheter, initial encounter: Secondary | ICD-10-CM | POA: Diagnosis not present

## 2017-11-16 DIAGNOSIS — C679 Malignant neoplasm of bladder, unspecified: Secondary | ICD-10-CM | POA: Diagnosis not present

## 2017-11-18 DIAGNOSIS — I10 Essential (primary) hypertension: Secondary | ICD-10-CM | POA: Diagnosis not present

## 2017-11-18 DIAGNOSIS — T83098A Other mechanical complication of other indwelling urethral catheter, initial encounter: Secondary | ICD-10-CM | POA: Diagnosis not present

## 2017-11-18 DIAGNOSIS — E1140 Type 2 diabetes mellitus with diabetic neuropathy, unspecified: Secondary | ICD-10-CM | POA: Diagnosis not present

## 2017-11-18 DIAGNOSIS — J449 Chronic obstructive pulmonary disease, unspecified: Secondary | ICD-10-CM | POA: Diagnosis not present

## 2017-11-18 DIAGNOSIS — Z510 Encounter for antineoplastic radiation therapy: Secondary | ICD-10-CM | POA: Diagnosis not present

## 2017-11-18 DIAGNOSIS — K219 Gastro-esophageal reflux disease without esophagitis: Secondary | ICD-10-CM | POA: Diagnosis not present

## 2017-11-18 DIAGNOSIS — R339 Retention of urine, unspecified: Secondary | ICD-10-CM | POA: Diagnosis not present

## 2017-11-18 DIAGNOSIS — F329 Major depressive disorder, single episode, unspecified: Secondary | ICD-10-CM | POA: Diagnosis not present

## 2017-11-18 DIAGNOSIS — M109 Gout, unspecified: Secondary | ICD-10-CM | POA: Diagnosis not present

## 2017-11-18 DIAGNOSIS — Z87891 Personal history of nicotine dependence: Secondary | ICD-10-CM | POA: Diagnosis not present

## 2017-11-18 DIAGNOSIS — Z87442 Personal history of urinary calculi: Secondary | ICD-10-CM | POA: Diagnosis not present

## 2017-11-18 DIAGNOSIS — C61 Malignant neoplasm of prostate: Secondary | ICD-10-CM | POA: Diagnosis not present

## 2017-11-18 DIAGNOSIS — Z85118 Personal history of other malignant neoplasm of bronchus and lung: Secondary | ICD-10-CM | POA: Diagnosis not present

## 2017-11-18 DIAGNOSIS — Z8546 Personal history of malignant neoplasm of prostate: Secondary | ICD-10-CM | POA: Diagnosis not present

## 2017-11-18 DIAGNOSIS — Z466 Encounter for fitting and adjustment of urinary device: Secondary | ICD-10-CM | POA: Diagnosis not present

## 2017-11-19 DIAGNOSIS — Z510 Encounter for antineoplastic radiation therapy: Secondary | ICD-10-CM | POA: Diagnosis not present

## 2017-11-19 DIAGNOSIS — C61 Malignant neoplasm of prostate: Secondary | ICD-10-CM | POA: Diagnosis not present

## 2017-11-20 DIAGNOSIS — C61 Malignant neoplasm of prostate: Secondary | ICD-10-CM | POA: Diagnosis not present

## 2017-11-20 DIAGNOSIS — Z510 Encounter for antineoplastic radiation therapy: Secondary | ICD-10-CM | POA: Diagnosis not present

## 2017-11-26 DIAGNOSIS — C61 Malignant neoplasm of prostate: Secondary | ICD-10-CM | POA: Diagnosis not present

## 2017-11-26 DIAGNOSIS — Z510 Encounter for antineoplastic radiation therapy: Secondary | ICD-10-CM | POA: Diagnosis not present

## 2017-11-27 DIAGNOSIS — C61 Malignant neoplasm of prostate: Secondary | ICD-10-CM | POA: Diagnosis not present

## 2017-11-27 DIAGNOSIS — Z510 Encounter for antineoplastic radiation therapy: Secondary | ICD-10-CM | POA: Diagnosis not present

## 2017-11-28 DIAGNOSIS — C61 Malignant neoplasm of prostate: Secondary | ICD-10-CM | POA: Diagnosis not present

## 2017-11-28 DIAGNOSIS — Z510 Encounter for antineoplastic radiation therapy: Secondary | ICD-10-CM | POA: Diagnosis not present

## 2017-11-29 DIAGNOSIS — C61 Malignant neoplasm of prostate: Secondary | ICD-10-CM | POA: Diagnosis not present

## 2017-11-29 DIAGNOSIS — Z510 Encounter for antineoplastic radiation therapy: Secondary | ICD-10-CM | POA: Diagnosis not present

## 2017-11-30 DIAGNOSIS — N3289 Other specified disorders of bladder: Secondary | ICD-10-CM | POA: Diagnosis not present

## 2017-11-30 DIAGNOSIS — Z79899 Other long term (current) drug therapy: Secondary | ICD-10-CM | POA: Diagnosis not present

## 2017-11-30 DIAGNOSIS — R1030 Lower abdominal pain, unspecified: Secondary | ICD-10-CM | POA: Diagnosis not present

## 2017-11-30 DIAGNOSIS — Z794 Long term (current) use of insulin: Secondary | ICD-10-CM | POA: Diagnosis not present

## 2017-11-30 DIAGNOSIS — E119 Type 2 diabetes mellitus without complications: Secondary | ICD-10-CM | POA: Diagnosis not present

## 2017-11-30 DIAGNOSIS — Z881 Allergy status to other antibiotic agents status: Secondary | ICD-10-CM | POA: Diagnosis not present

## 2017-11-30 DIAGNOSIS — Z7982 Long term (current) use of aspirin: Secondary | ICD-10-CM | POA: Diagnosis not present

## 2017-11-30 DIAGNOSIS — J449 Chronic obstructive pulmonary disease, unspecified: Secondary | ICD-10-CM | POA: Diagnosis not present

## 2017-11-30 DIAGNOSIS — I10 Essential (primary) hypertension: Secondary | ICD-10-CM | POA: Diagnosis not present

## 2017-11-30 DIAGNOSIS — R339 Retention of urine, unspecified: Secondary | ICD-10-CM | POA: Diagnosis not present

## 2017-12-01 DIAGNOSIS — H2512 Age-related nuclear cataract, left eye: Secondary | ICD-10-CM | POA: Diagnosis not present

## 2017-12-05 DIAGNOSIS — H401123 Primary open-angle glaucoma, left eye, severe stage: Secondary | ICD-10-CM | POA: Diagnosis not present

## 2017-12-05 DIAGNOSIS — H2512 Age-related nuclear cataract, left eye: Secondary | ICD-10-CM | POA: Diagnosis not present

## 2017-12-06 ENCOUNTER — Ambulatory Visit (INDEPENDENT_AMBULATORY_CARE_PROVIDER_SITE_OTHER): Payer: Medicare Other | Admitting: Urology

## 2017-12-06 DIAGNOSIS — R338 Other retention of urine: Secondary | ICD-10-CM | POA: Diagnosis not present

## 2017-12-06 DIAGNOSIS — N403 Nodular prostate with lower urinary tract symptoms: Secondary | ICD-10-CM

## 2017-12-06 DIAGNOSIS — C61 Malignant neoplasm of prostate: Secondary | ICD-10-CM

## 2017-12-10 ENCOUNTER — Emergency Department (HOSPITAL_COMMUNITY)
Admission: EM | Admit: 2017-12-10 | Discharge: 2017-12-11 | Disposition: A | Discharge status: Home / Self Care | Payer: Medicare Other | Attending: Emergency Medicine | Admitting: Emergency Medicine

## 2017-12-10 ENCOUNTER — Other Ambulatory Visit: Payer: Self-pay

## 2017-12-10 ENCOUNTER — Encounter (HOSPITAL_COMMUNITY): Payer: Self-pay | Admitting: Emergency Medicine

## 2017-12-10 DIAGNOSIS — R1030 Lower abdominal pain, unspecified: Secondary | ICD-10-CM | POA: Insufficient documentation

## 2017-12-10 DIAGNOSIS — Z86718 Personal history of other venous thrombosis and embolism: Secondary | ICD-10-CM | POA: Insufficient documentation

## 2017-12-10 DIAGNOSIS — Z87891 Personal history of nicotine dependence: Secondary | ICD-10-CM | POA: Insufficient documentation

## 2017-12-10 DIAGNOSIS — I10 Essential (primary) hypertension: Secondary | ICD-10-CM | POA: Diagnosis not present

## 2017-12-10 DIAGNOSIS — Z79899 Other long term (current) drug therapy: Secondary | ICD-10-CM | POA: Insufficient documentation

## 2017-12-10 DIAGNOSIS — K449 Diaphragmatic hernia without obstruction or gangrene: Secondary | ICD-10-CM | POA: Diagnosis not present

## 2017-12-10 DIAGNOSIS — R3989 Other symptoms and signs involving the genitourinary system: Secondary | ICD-10-CM | POA: Insufficient documentation

## 2017-12-10 DIAGNOSIS — I4891 Unspecified atrial fibrillation: Secondary | ICD-10-CM | POA: Diagnosis not present

## 2017-12-10 DIAGNOSIS — J449 Chronic obstructive pulmonary disease, unspecified: Secondary | ICD-10-CM | POA: Insufficient documentation

## 2017-12-10 DIAGNOSIS — Z8546 Personal history of malignant neoplasm of prostate: Secondary | ICD-10-CM | POA: Diagnosis not present

## 2017-12-10 DIAGNOSIS — E1140 Type 2 diabetes mellitus with diabetic neuropathy, unspecified: Secondary | ICD-10-CM | POA: Diagnosis not present

## 2017-12-10 LAB — COMPREHENSIVE METABOLIC PANEL
ALT: 20 U/L (ref 0–44)
AST: 21 U/L (ref 15–41)
Albumin: 3.8 g/dL (ref 3.5–5.0)
Alkaline Phosphatase: 46 U/L (ref 38–126)
Anion gap: 10 (ref 5–15)
BUN: 13 mg/dL (ref 8–23)
CO2: 26 mmol/L (ref 22–32)
Calcium: 9.3 mg/dL (ref 8.9–10.3)
Chloride: 104 mmol/L (ref 98–111)
Creatinine, Ser: 1.3 mg/dL — ABNORMAL HIGH (ref 0.61–1.24)
GFR calc Af Amer: >60 mL/min (ref >60)
GFR calc non Af Amer: 52 mL/min — ABNORMAL LOW (ref >60)
Glucose, Bld: 116 mg/dL — ABNORMAL HIGH (ref 70–99)
Potassium: 3.9 mmol/L (ref 3.5–5.1)
Sodium: 140 mmol/L (ref 135–145)
Total Bilirubin: 0.6 mg/dL (ref 0.3–1.2)
Total Protein: 7.7 g/dL (ref 6.5–8.1)

## 2017-12-10 LAB — CBC
HCT: 35.8 % — ABNORMAL LOW (ref 39.0–52.0)
Hemoglobin: 12.1 g/dL — ABNORMAL LOW (ref 13.0–17.0)
MCH: 31.2 pg (ref 26.0–34.0)
MCHC: 33.8 g/dL (ref 30.0–36.0)
MCV: 92.3 fL (ref 78.0–100.0)
Platelets: 200 10*3/uL (ref 150–400)
RBC: 3.88 MIL/uL — ABNORMAL LOW (ref 4.22–5.81)
RDW: 14.2 % (ref 11.5–15.5)
WBC: 5.3 10*3/uL (ref 4.0–10.5)

## 2017-12-10 LAB — LIPASE, BLOOD: Lipase: 37 U/L (ref 11–51)

## 2017-12-10 MED ORDER — IOHEXOL 300 MG/ML  SOLN: 30.0000 mL | Freq: Once | INTRAMUSCULAR | Status: DC | PRN

## 2017-12-10 MED ORDER — SODIUM CHLORIDE 0.9 % IV SOLN
Freq: Once | INTRAVENOUS | Status: AC
  Administered 2017-12-11: via INTRAVENOUS

## 2017-12-10 NOTE — ED Triage Notes (Addendum)
Patient c/o bladder spasms and generalized abdominal pain and distension since completing radiation for prostate cancer x1 week ago. Also reports difficulty sleeping because of pain. Denies N/V/D. Reports foley in place x9 weeks. Last BM last night.

## 2017-12-10 NOTE — ED Provider Notes (Signed)
[facility] DEPT Provider Note: Georgena Spurling, MD, FACEP  CSN: [id number] MRN: [id number] ARRIVAL: 12/10/17 at 2047 ROOM: [place]  Abdominal Pain   HISTORY OF PRESENT ILLNESS  12/10/17 10:55 PM Jonathan [name] is a 77 y.o. male who recently completed radiation for prostate cancer.  He has an indwelling catheter which she states they intend to keep in place for a month while his system recovers from the radiation.  Since completing radiation he has had episodes of severe lower abdominal pain and cramping.  He relates the pain both to bladder spasms and to rectal pain.  He gets constipated easily which he can treat with Linzess but the symptoms soon returned.  He has been treated for the bladder spasms with Myrbetriq.  He states he has been seen at [facility] 5 times in the past week and they treat him with morphine which provides only temporary relief.  He has had a decreased appetite and has not eaten in 2 days.  He attempted to eat a hamburger earlier today but was unable.  He has chronic pain in his right ankle due to previous surgery.  He also has diabetic neuropathy in his feet.  He is status post lung resection for cancer but denies shortness of breath.   Past Medical History:  Diagnosis Date  . Acquired bladder diverticulum    12/ 2012  resection diverticulum done at [facility] in [place], [place]  . Age-related cataract of right eye    scheduled for catarat extraction 09-12-2017  . Agent orange exposure   . Aneurysm of infrarenal abdominal aorta ([place])    first dx 2017--- last CT 06-11-2017  measures 3.5cm  . Chronic constipation   . COPD with emphysema ([place])    06--12-2017  per pt no symptoms since Feb 2019  . Depression   . Diabetes mellitus type 2, diet-controlled ([place])   . Diabetic neuropathy ([place])   . Dyslipidemia   . Dyspnea on exertion   . Erectile dysfunction   . Feeling of incomplete bladder emptying   . GERD (gastroesophageal reflux disease)    . Gout    09-02-2017 last flare up 3 months ago  . Hiatal hernia   . History of atrial fibrillation    episode post op right lung lobectomy 07-04-2015  . History of bladder stone   . History of colon polyps   . History of DVT of lower extremity    03/ 2019  bilateral lower extremity (superficial) ---  per treated w/ oral medication   . History of gastritis   . History of kidney stones   . History of radiation therapy 09-14-2015  to 09-21-2015   left upper lung nodule -- 54Gy in 3 fractions (18 Gy per fraction)  . Hyperplasia of prostate with lower urinary tract symptoms (LUTS)   . Hypertension   . Lumbar spinal stenosis   . Nephrolithiasis   . Neurogenic bladder   . Osteoarthritis    ankle, hands  . Osteoporosis   . Prostate cancer [facility]) urologist-  dr wrenn/  oncologist-  dr Tammi Klippel   dx 05-24-2017-- Stage T2b,  Gleason 4+4,  PSA 22.41,  vol 38cc--- started ADT 04/ 2019,  plan external radiation therapy  . Radiation fibrosis of lung ([place])    hx left upper long nodule SBRT 06/ 2017  . Squamous cell carcinoma of both lungs ([place]) last CT in epic dated 06-26-2017 no recurrence   dx 03/ 2017  non-small cell SCC  via bronchoscopy w/ bx's by dr Roxan Hockey---  s/p  right VATS w/ right lobectomy and node dissection's 07-04-2015 /  pt had SBRT to left upper nodule Stage 1 ([facility]) completed 09-21-2015  . Wears dentures    bottom  . Wears glasses     Past Surgical History:  Procedure Laterality Date  . CARPAL TUNNEL RELEASE Right 07/09/2014   Procedure: RIGHT OPEN CARPAL TUNNEL RELEASE;  Surgeon: Mcarthur Rossetti, MD;  Location: [facility];  Service: Orthopedics;  Laterality: Right;  . CHOLECYSTECTOMY N/A 02/24/2014   Procedure: LAPAROSCOPIC CHOLECYSTECTOMY;  Surgeon: Coralie Keens, MD;  Location: [facility];  Service: General;  Laterality: N/A;  . COLONOSCOPY    . [facility]ST[facility]ITHOT[facility]  [facility]      [facility] [place]  [facility] CYSTOSCOPY W/ LITHOLAPAXY / EHL   02-24-2007   dr Janice Norrie  [facility]  . GOLD SEED IMPLANT N/A 09/05/2017   Procedure: GOLD SEED IMPLANT;  Surgeon: Irine Seal, MD;  Location: [facility];  Service: Urology;  Laterality: N/A;  . SPACE OAR INSTILLATION N/A 09/05/2017   Procedure: SPACE OAR INSTILLATION;  Surgeon: Irine Seal, MD;  Location: [facility];  Service: Urology;  Laterality: N/A;  . TOTAL ANKLE ARTHROPLASTY Right 04/20/2015   Procedure: TOTAL ANKLE ARTHOPLASTY;  Surgeon: Newt Minion, MD;  Location: [facility];  Service: Orthopedics;  Laterality: Right;  . TRANSTHORACIC ECHOCARDIOGRAM  11/16/2012   ef 55-60%,  grade 1 diastolic dysfunction/  mild dilated ascending aorta/  mild MR/ trivial TR  . VIDEO ASSISTED THORACOSCOPY (VATS)/ LOBECTOMY Right 07/04/2015   Procedure: VIDEO ASSISTED THORACOSCOPY (VATS)/ RIGHT UPPER LOBECTOMY;  Surgeon: Melrose Nakayama, MD;  Location: [facility];  Service: Thoracic;  Laterality: Right;  [name] VIDEO BRONCHOSCOPY N/A 06/17/2015   Procedure: VIDEO BRONCHOSCOPY;  Surgeon: Melrose Nakayama, MD;  Location: [facility];  Service: Thoracic;  Laterality: N/A;  . VIDEO BRONCHOSCOPY WITH ENDOBRONCHIAL NAVIGATION N/A 06/17/2015   Procedure: VIDEO BRONCHOSCOPY WITH ENDOBRONCHIAL NAVIGATION;  Surgeon: Melrose Nakayama, MD;  Location: [facility];  Service: Thoracic;  Laterality: N/A;  . VIDEO BRONCHOSCOPY WITH ENDOBRONCHIAL ULTRASOUND N/A 06/17/2015   Procedure: VIDEO BRONCHOSCOPY WITH ENDOBRONCHIAL ULTRASOUND;  Surgeon: Melrose Nakayama, MD;  Location: [facility];  Service: Thoracic;  Laterality: N/A;    Family History  Problem Relation Age of Onset  . Cancer Father        Asbestos  . Colon cancer Neg Hx   . Colon polyps Neg Hx   . Kidney disease Neg Hx   . Esophageal cancer Neg Hx   . Gallbladder disease Neg Hx   . Heart disease Neg Hx   . Diabetes Neg Hx     Social History   Tobacco Use  . Smoking status: Former Smoker    Packs/day: 0.50    Years: 40.00    Pack years: 20.00     Types: Cigarettes    Last attempt to quit: 02/13/2015    Years since quitting: 2.8  . Smokeless tobacco: Never Used  Substance Use Topics  . Alcohol use: No    Alcohol/week: 0.0 standard drinks  . Drug use: No    Prior to Admission medications   Medication Sig Start Date End Date Taking? Authorizing Provider  albuterol (PROVENTIL HFA;VENTOLIN HFA) 108 (90 BASE) MCG/ACT inhaler Inhale 1 puff into the lungs every 6 (six) hours as needed for wheezing or shortness of breath.   Yes [provider]  atenolol (TENORMIN) 25 MG  tablet Take 1 tablet (25 mg total) by mouth at bedtime. Patient taking differently: Take 25 mg by mouth daily after breakfast.  07/14/15  Yes Tacy Dura, Donielle M, PA-C  ipratropium-albuterol (DUONEB) 0.5-2.5 (3) MG/3ML SOLN Take 3 mLs by nebulization daily as needed (wheezing).    Yes [provider]  ketorolac (ACULAR) 0.4 % SOLN Place 1 drop into the left eye 4 (four) times daily. 11/28/17  Yes [provider]  LANTUS SOLOSTAR 100 UNIT/ML Solostar Pen Inject 5 Units into the skin daily after breakfast.  11/28/17  Yes [provider]  LINZESS 72 MCG capsule Take 72 mcg by mouth daily as needed (constipation).  10/03/17  Yes [provider]  mirabegron ER (MYRBETRIQ) 25 MG TB24 tablet Take 25 mg by mouth at bedtime.   Yes [provider]  ofloxacin (OCUFLOX) 0.3 % ophthalmic solution Place 1 drop into the left eye 4 (four) times daily. 11/28/17  Yes [provider]  Oxycodone HCl 10 MG TABS Take 10 mg by mouth 4 (four) times daily as needed (pain).  07/18/15  Yes [provider]  prednisoLONE acetate (PRED FORTE) 1 % ophthalmic suspension Place 1 drop into both eyes 2 (two) times daily. 11/28/17  Yes [provider]  predniSONE (DELTASONE) 20 MG tablet Take 20 mg by mouth daily as needed (for gout symptoms).   Yes [provider]  sildenafil (VIAGRA) 100 MG tablet Take 100 mg by mouth daily as  needed for erectile dysfunction.    Yes [provider]  SIMBRINZA 1-0.2 % SUSP Place 1 drop into both eyes 4 (four) times daily. 09/24/17  Yes [provider]  tamsulosin (FLOMAX) 0.4 MG CAPS capsule Take 0.8 mg by mouth daily after supper. Reported on 10/12/2015   Yes [provider]  tiotropium (SPIRIVA) 18 MCG inhalation capsule Place 18 mcg into inhaler and inhale daily as needed (Shortness of breath). Reported on 10/12/2015   Yes [provider]    Allergies Lisinopril; Ciprofloxacin; and Levaquin [levofloxacin]   REVIEW OF SYSTEMS  Negative except as noted here or in the History of Present Illness.   PHYSICAL EXAMINATION  Initial Vital Signs Blood pressure (!) 126/110, pulse 97, temperature 99 F (37.2 C), temperature source Oral, resp. rate 16, height 6\' 8"  (2.032 m), weight 119.3 kg, SpO2 97 %.  Examination General: Well-developed, well-nourished male in no acute distress; appearance consistent with age of record HENT: normocephalic; atraumatic Eyes: pupils equal, round and reactive to light; extraocular muscles intact; arcus senilis bilaterally; bilateral pseudophakia Neck: supple Heart: regular rate and rhythm Lungs: clear to auscultation bilaterally Abdomen: soft; nondistended; lower abdominal tenderness; bowel sounds present Rectal; normal sphincter tone; no stool in vault Extremities: No deformity; chronic appearing changes of right ankle with mildly decreased range of motion; pulses normal Neurologic: Awake, alert and oriented; motor function intact in all extremities and symmetric; no facial droop Skin: Warm and dry Psychiatric: Normal mood and affect   RESULTS  Summary of this visit's results, reviewed by myself:   EKG Interpretation  Date/Time:    Ventricular Rate:    PR Interval:    QRS Duration:   QT Interval:    QTC Calculation:   R Axis:     Text Interpretation:        Laboratory Studies: Results for orders  placed or performed during the hospital encounter of 12/10/17 (from the past 24 hour(s))  Urinalysis, Routine w reflex microscopic     Status: Abnormal   Collection  Time: 12/10/17  9:23 PM  Result Value Ref Range   Color, Urine YELLOW YELLOW   APPearance CLOUDY (A) CLEAR   Specific Gravity, Urine 1.016 1.005 - 1.030   pH 6.0 5.0 - 8.0   Glucose, UA NEGATIVE NEGATIVE mg/dL   Hgb urine dipstick MODERATE (A) NEGATIVE   Bilirubin Urine NEGATIVE NEGATIVE   Ketones, ur NEGATIVE NEGATIVE mg/dL   Protein, ur 100 (A) NEGATIVE mg/dL   Nitrite POSITIVE (A) NEGATIVE   Leukocytes, UA LARGE (A) NEGATIVE   RBC / HPF >50 (H) 0 - 5 RBC/hpf   WBC, UA >50 (H) 0 - 5 WBC/hpf   Bacteria, UA MANY (A) NONE SEEN   WBC Clumps PRESENT    Mucus PRESENT   Lipase, blood     Status: None   Collection Time: 12/10/17  9:27 PM  Result Value Ref Range   Lipase 37 11 - 51 U/L  Comprehensive metabolic panel     Status: Abnormal   Collection Time: 12/10/17  9:27 PM  Result Value Ref Range   Sodium 140 135 - 145 mmol/L   Potassium 3.9 3.5 - 5.1 mmol/L   Chloride 104 98 - 111 mmol/L   CO2 26 22 - 32 mmol/L   Glucose, Bld 116 (H) 70 - 99 mg/dL   BUN 13 8 - 23 mg/dL   Creatinine, Ser 1.30 (H) 0.61 - 1.24 mg/dL   Calcium 9.3 8.9 - 10.3 mg/dL   Total Protein 7.7 6.5 - 8.1 g/dL   Albumin 3.8 3.5 - 5.0 g/dL   AST 21 15 - 41 U/L   ALT 20 0 - 44 U/L   Alkaline Phosphatase 46 38 - 126 U/L   Total Bilirubin 0.6 0.3 - 1.2 mg/dL   GFR calc non Af Amer 52 (L) >60 mL/min   GFR calc Af Amer >60 >60 mL/min   Anion gap 10 5 - 15  CBC     Status: Abnormal   Collection Time: 12/10/17  9:27 PM  Result Value Ref Range   WBC 5.3 4.0 - 10.5 K/uL   RBC 3.88 (L) 4.22 - 5.81 MIL/uL   Hemoglobin 12.1 (L) 13.0 - 17.0 g/dL   HCT 35.8 (L) 39.0 - 52.0 %   MCV 92.3 78.0 - 100.0 fL   MCH 31.2 26.0 - 34.0 pg   MCHC 33.8 30.0 - 36.0 g/dL   RDW 14.2 11.5 - 15.5 %   Platelets 200 150 - 400 K/uL   Imaging Studies: Ct Abdomen Pelvis W  Contrast  Result Date: 12/11/2017 CLINICAL DATA:  Abdominal pain, nausea, and vomiting. EXAM: CT ABDOMEN AND PELVIS WITH CONTRAST TECHNIQUE: Multidetector CT imaging of the abdomen and pelvis was performed using the standard protocol following bolus administration of intravenous contrast. CONTRAST:  184mL ISOVUE-300 IOPAMIDOL (ISOVUE-300) INJECTION 61% COMPARISON:  CT pelvis 10/10/2017 FINDINGS: Lower chest: Mild dependent changes in the lung bases. Small esophageal hiatal hernia. Hepatobiliary: Scattered subcentimeter low-attenuation lesions in the liver are too small to characterize but likely representing cysts. Surgical absence of the gallbladder. No bile duct dilatation. Pancreas: Unremarkable. No pancreatic ductal dilatation or surrounding inflammatory changes. Spleen: Normal in size without focal abnormality. Adrenals/Urinary Tract: No adrenal gland nodules. Bilateral renal parenchymal cysts. Non-obstructing 3 mm stone in the midpole left kidney. No hydronephrosis or hydroureter. Bladder is decompressed with a Foley catheter. Despite decompression, there appears to be diffuse bladder wall thickening. This is similar to previous study and could indicate chronic cystitis or radiation change. Tumor infiltration could  also have this appearance. Small amount of gas in the thickened bladder wall leans towards infectious process. Stomach/Bowel: Stomach, small bowel, and colon are not abnormally distended. No inflammatory changes are demonstrated. Scattered stool throughout the colon. The anterior and a rectal wall is focally thickened, possibly due to radiation change. Appearance is unchanged since previous study. Appendix is normal. Vascular/Lymphatic: Mildly dilated abdominal aorta. Mild scattered aortic calcifications. Scattered pelvic lymph nodes without pathologic enlargement. No retroperitoneal lymphadenopathy. Reproductive: Seed implants in the prostate gland. Other: No free air or free fluid in the  abdomen. Abdominal wall musculature appears intact. Musculoskeletal: Degenerative changes in the spine. No destructive or sclerotic bone lesions appreciated. IMPRESSION: 1. No evidence of bowel obstruction or inflammation. 2. Diffuse bladder wall thickening may indicate chronic cystitis or radiation change. Bladder is decompressed with a Foley catheter. Small amount of gas in the thickened bladder wall may favor infection. 3. Focal thickening of the rectal wall may be due to radiation change. 4. Probable hepatic and renal cysts. 5. Small esophageal hiatal hernia. Aortic Atherosclerosis (ICD10-I70.0). Electronically Signed   By: Lucienne Capers M.D.   On: 12/11/2017 01:55    ED COURSE and MDM  Nursing notes and initial vitals signs, including pulse oximetry, reviewed.  Vitals:   12/10/17 2107 12/11/17 0024 12/11/17 0208  BP: (!) 126/110 (!) 143/98 (!) 151/90  Pulse: 97 85 77  Resp: 16 18 18   Temp: 99 F (37.2 C)  97.9 F (36.6 C)  TempSrc: Oral  Oral  SpO2: 97% 96% 92%  Weight: 119.3 kg    Height: 6\' 8"  (2.032 m)     3:25 AM Patient's pain well controlled at this time.  He was given Rocephin 1 g IV for possible chronic urinary tract infection.  We will discharge him home on antibiotics.  PROCEDURES    ED DIAGNOSES     ICD-10-CM   1. Bladder pain R39.89        Trevione Wert, Jenny Reichmann, MD 12/11/17 [phone]

## 2017-12-11 ENCOUNTER — Emergency Department (HOSPITAL_COMMUNITY): Payer: Medicare Other

## 2017-12-11 DIAGNOSIS — R1030 Lower abdominal pain, unspecified: Secondary | ICD-10-CM | POA: Diagnosis not present

## 2017-12-11 DIAGNOSIS — K449 Diaphragmatic hernia without obstruction or gangrene: Secondary | ICD-10-CM | POA: Diagnosis not present

## 2017-12-11 LAB — URINALYSIS, ROUTINE W REFLEX MICROSCOPIC
Bilirubin Urine: NEGATIVE
Glucose, UA: NEGATIVE mg/dL
Ketones, ur: NEGATIVE mg/dL
Nitrite: POSITIVE — AB
Protein, ur: 100 mg/dL — AB
RBC / HPF: >50 RBC/hpf — ABNORMAL HIGH (ref 0–5)
Specific Gravity, Urine: 1.016 (ref 1.005–1.030)
WBC, UA: >50 WBC/hpf — ABNORMAL HIGH (ref 0–5)
pH: 6 (ref 5.0–8.0)

## 2017-12-11 MED ORDER — IOPAMIDOL (ISOVUE-300) INJECTION 61%
INTRAVENOUS | Status: AC
  Filled 2017-12-11: qty 100

## 2017-12-11 MED ORDER — FENTANYL CITRATE (PF) 100 MCG/2ML IJ SOLN
100.0000 ug | Freq: Once | INTRAMUSCULAR | Status: AC
  Administered 2017-12-11: 100 ug via INTRAVENOUS
  Filled 2017-12-11: qty 2

## 2017-12-11 MED ORDER — SODIUM CHLORIDE 0.9 % IV SOLN
1.0000 g | Freq: Once | INTRAVENOUS | Status: AC
  Administered 2017-12-11: 1 g via INTRAVENOUS
  Filled 2017-12-11: qty 10

## 2017-12-11 MED ORDER — IOPAMIDOL (ISOVUE-300) INJECTION 61%
100.0000 mL | Freq: Once | INTRAVENOUS | Status: AC | PRN
  Administered 2017-12-11: 100 mL via INTRAVENOUS

## 2017-12-11 MED ORDER — CEPHALEXIN 500 MG PO CAPS: 500.0000 mg | ORAL_CAPSULE | Freq: Four times a day (QID) | ORAL | 0 refills | Status: DC

## 2017-12-13 DIAGNOSIS — Z6828 Body mass index (BMI) 28.0-28.9, adult: Secondary | ICD-10-CM | POA: Diagnosis not present

## 2017-12-13 DIAGNOSIS — R1084 Generalized abdominal pain: Secondary | ICD-10-CM | POA: Diagnosis not present

## 2017-12-13 DIAGNOSIS — R972 Elevated prostate specific antigen [PSA]: Secondary | ICD-10-CM | POA: Diagnosis not present

## 2017-12-13 DIAGNOSIS — N3090 Cystitis, unspecified without hematuria: Secondary | ICD-10-CM | POA: Diagnosis not present

## 2017-12-13 DIAGNOSIS — I10 Essential (primary) hypertension: Secondary | ICD-10-CM | POA: Diagnosis not present

## 2017-12-13 DIAGNOSIS — E0865 Diabetes mellitus due to underlying condition with hyperglycemia: Secondary | ICD-10-CM | POA: Diagnosis not present

## 2017-12-13 DIAGNOSIS — C61 Malignant neoplasm of prostate: Secondary | ICD-10-CM | POA: Diagnosis not present

## 2017-12-13 DIAGNOSIS — G5621 Lesion of ulnar nerve, right upper limb: Secondary | ICD-10-CM | POA: Diagnosis not present

## 2017-12-14 LAB — URINE CULTURE: Culture: >=100000 — AB

## 2017-12-15 ENCOUNTER — Telehealth: Payer: Self-pay | Admitting: *Deleted

## 2017-12-15 NOTE — Telephone Encounter (Signed)
Post ED Visit - Positive Culture Follow-up  Culture report reviewed by antimicrobial stewardship pharmacist:  []  Elenor Quinones, Pharm.D. []  Heide Guile, Pharm.D., BCPS AQ-ID []  Parks Neptune, Pharm.D., BCPS []  Alycia Rossetti, Pharm.D., BCPS []  [place], [place].D., BCPS, AAHIVP []  Legrand Como, Pharm.D., BCPS, AAHIVP [x]  Salome Arnt, PharmD, BCPS []  Johnnette Gourd, PharmD, BCPS []  Hughes Better, PharmD, BCPS []  Leeroy Cha, PharmD  Positive urine culture Treated with Cephalexin.  Symptom check completed, feeling much better with no complaints at this time and no further patient follow-up is required at this time.  Harlon Flor [facility] 12/15/2017, 9:54 AM

## 2017-12-15 NOTE — Progress Notes (Signed)
ED Antimicrobial Stewardship Positive Culture Follow Up   Jonathan [name] is an 77 y.o. male who presented to [facility] on 12/10/2017 with a chief complaint of  Chief Complaint  Patient presents with  . Abdominal Pain    Recent Results (from the past 720 hour(s))  Urine culture     Status: Abnormal   Collection Time: 12/11/17  1:38 AM  Result Value Ref Range Status   Specimen Description   Final    URINE, CLEAN CATCH Performed at [facility], [place] [address] [place], [place] [address]    Special Requests   Final    NONE Performed at [facility], [place] [address] [place], [place] [address]    Culture (A)  Final    >=100,000 COLONIES/mL KLEBSIELLA PNEUMONIAE >=100,000 COLONIES/mL PSEUDOMONAS AERUGINOSA    Report Status 12/14/2017 FINAL  Final   Organism ID, Bacteria KLEBSIELLA PNEUMONIAE (A)  Final   Organism ID, Bacteria PSEUDOMONAS AERUGINOSA (A)  Final      Susceptibility   Klebsiella pneumoniae - MIC*    AMPICILLIN >=32 RESISTANT Resistant     CEFAZOLIN <=4 SENSITIVE Sensitive     CEFTRIAXONE <=1 SENSITIVE Sensitive     CIPROFLOXACIN <=0.25 SENSITIVE Sensitive     GENTAMICIN <=1 SENSITIVE Sensitive     IMIPENEM <=0.25 SENSITIVE Sensitive     NITROFURANTOIN 32 SENSITIVE Sensitive     TRIMETH/SULFA <=20 SENSITIVE Sensitive     AMPICILLIN/SULBACTAM 4 SENSITIVE Sensitive     PIP/TAZO 8 SENSITIVE Sensitive     Extended ESBL NEGATIVE Sensitive     * >=100,000 COLONIES/mL KLEBSIELLA PNEUMONIAE   Pseudomonas aeruginosa - MIC*    CEFTAZIDIME 2 SENSITIVE Sensitive     CIPROFLOXACIN <=0.25 SENSITIVE Sensitive     GENTAMICIN <=1 SENSITIVE Sensitive     IMIPENEM 1 SENSITIVE Sensitive     PIP/TAZO <=4 SENSITIVE Sensitive     CEFEPIME 2 SENSITIVE Sensitive     * >=100,000 COLONIES/mL PSEUDOMONAS AERUGINOSA    [x]  Treated with cephalexin, organism resistant to prescribed antimicrobial []  Patient discharged originally without  antimicrobial agent and treatment is now indicated  New antibiotic prescription: Check on pt for ongoing symptoms. If pt continues to have symptoms, will need to clarify his cipro allergy. Listed that it "doesn't work." Will ensure that he does not have any other true allergy to cipro and educate that cipro will work for these particular organisms that have grown out. Will stop cephalexin and start ciprofloxacin 500mg  PO BID 5 days  ED Provider: Suella Broad, PA   Jasslyn Finkel, Rande Lawman 12/15/2017, 9:20 AM Clinical Pharmacist Monday - Friday phone -  [phone] Saturday - Sunday phone - [phone]

## 2017-12-20 ENCOUNTER — Telehealth (INDEPENDENT_AMBULATORY_CARE_PROVIDER_SITE_OTHER): Payer: Self-pay

## 2017-12-20 NOTE — Telephone Encounter (Signed)
Patient was called and verbally understand that his FCE form needed to be done. [facility] stated [facility] and asked to fax him the paperwork on file. His Examination for [facility] papers were filled out and was faxed today to [phone] (number given) and he also has upcoming appt for 01/09/18 to see Dr Sharol Given.

## 2017-12-21 ENCOUNTER — Emergency Department (HOSPITAL_COMMUNITY): Payer: Medicare Other

## 2017-12-21 ENCOUNTER — Emergency Department (HOSPITAL_COMMUNITY)
Admission: EM | Admit: 2017-12-21 | Discharge: 2017-12-21 | Disposition: A | Discharge status: Home / Self Care | Payer: Medicare Other | Attending: Emergency Medicine | Admitting: Emergency Medicine

## 2017-12-21 ENCOUNTER — Encounter (HOSPITAL_COMMUNITY): Payer: Self-pay | Admitting: *Deleted

## 2017-12-21 ENCOUNTER — Other Ambulatory Visit: Payer: Self-pay

## 2017-12-21 DIAGNOSIS — Y829 Unspecified medical devices associated with adverse incidents: Secondary | ICD-10-CM | POA: Diagnosis not present

## 2017-12-21 DIAGNOSIS — Z87891 Personal history of nicotine dependence: Secondary | ICD-10-CM | POA: Diagnosis not present

## 2017-12-21 DIAGNOSIS — F329 Major depressive disorder, single episode, unspecified: Secondary | ICD-10-CM | POA: Insufficient documentation

## 2017-12-21 DIAGNOSIS — R109 Unspecified abdominal pain: Secondary | ICD-10-CM

## 2017-12-21 DIAGNOSIS — E119 Type 2 diabetes mellitus without complications: Secondary | ICD-10-CM | POA: Diagnosis not present

## 2017-12-21 DIAGNOSIS — T83098A Other mechanical complication of other indwelling urethral catheter, initial encounter: Secondary | ICD-10-CM | POA: Insufficient documentation

## 2017-12-21 DIAGNOSIS — T83091A Other mechanical complication of indwelling urethral catheter, initial encounter: Secondary | ICD-10-CM | POA: Diagnosis not present

## 2017-12-21 DIAGNOSIS — J449 Chronic obstructive pulmonary disease, unspecified: Secondary | ICD-10-CM | POA: Insufficient documentation

## 2017-12-21 DIAGNOSIS — Z85118 Personal history of other malignant neoplasm of bronchus and lung: Secondary | ICD-10-CM | POA: Diagnosis not present

## 2017-12-21 DIAGNOSIS — C61 Malignant neoplasm of prostate: Secondary | ICD-10-CM | POA: Insufficient documentation

## 2017-12-21 DIAGNOSIS — Z79899 Other long term (current) drug therapy: Secondary | ICD-10-CM | POA: Diagnosis not present

## 2017-12-21 DIAGNOSIS — Z794 Long term (current) use of insulin: Secondary | ICD-10-CM | POA: Diagnosis not present

## 2017-12-21 DIAGNOSIS — Z96661 Presence of right artificial ankle joint: Secondary | ICD-10-CM | POA: Diagnosis not present

## 2017-12-21 DIAGNOSIS — I714 Abdominal aortic aneurysm, without rupture, unspecified: Secondary | ICD-10-CM

## 2017-12-21 DIAGNOSIS — T839XXA Unspecified complication of genitourinary prosthetic device, implant and graft, initial encounter: Secondary | ICD-10-CM

## 2017-12-21 DIAGNOSIS — Z9049 Acquired absence of other specified parts of digestive tract: Secondary | ICD-10-CM | POA: Insufficient documentation

## 2017-12-21 DIAGNOSIS — R1030 Lower abdominal pain, unspecified: Secondary | ICD-10-CM | POA: Diagnosis not present

## 2017-12-21 LAB — CBC WITH DIFFERENTIAL/PLATELET
Basophils Absolute: 0 10*3/uL (ref 0.0–0.1)
Basophils Relative: 0 %
Eosinophils Absolute: 0.2 10*3/uL (ref 0.0–0.7)
Eosinophils Relative: 5 %
HCT: 35 % — ABNORMAL LOW (ref 39.0–52.0)
Hemoglobin: 11.7 g/dL — ABNORMAL LOW (ref 13.0–17.0)
Lymphocytes Relative: 22 %
Lymphs Abs: 1 10*3/uL (ref 0.7–4.0)
MCH: 31 pg (ref 26.0–34.0)
MCHC: 33.4 g/dL (ref 30.0–36.0)
MCV: 92.6 fL (ref 78.0–100.0)
Monocytes Absolute: 0.4 10*3/uL (ref 0.1–1.0)
Monocytes Relative: 8 %
Neutro Abs: 3 10*3/uL (ref 1.7–7.7)
Neutrophils Relative %: 65 %
Platelets: 205 10*3/uL (ref 150–400)
RBC: 3.78 MIL/uL — ABNORMAL LOW (ref 4.22–5.81)
RDW: 13.9 % (ref 11.5–15.5)
WBC: 4.7 10*3/uL (ref 4.0–10.5)

## 2017-12-21 LAB — URINALYSIS, ROUTINE W REFLEX MICROSCOPIC
Bilirubin Urine: NEGATIVE
Glucose, UA: NEGATIVE mg/dL
Ketones, ur: NEGATIVE mg/dL
Nitrite: NEGATIVE
Protein, ur: 100 mg/dL — AB
RBC / HPF: >50 RBC/hpf — ABNORMAL HIGH (ref 0–5)
Specific Gravity, Urine: 1.035 — ABNORMAL HIGH (ref 1.005–1.030)
WBC, UA: >50 WBC/hpf — ABNORMAL HIGH (ref 0–5)
pH: 6 (ref 5.0–8.0)

## 2017-12-21 LAB — COMPREHENSIVE METABOLIC PANEL
ALT: 14 U/L (ref 0–44)
AST: 18 U/L (ref 15–41)
Albumin: 3.4 g/dL — ABNORMAL LOW (ref 3.5–5.0)
Alkaline Phosphatase: 46 U/L (ref 38–126)
Anion gap: 8 (ref 5–15)
BUN: 13 mg/dL (ref 8–23)
CO2: 28 mmol/L (ref 22–32)
Calcium: 9.4 mg/dL (ref 8.9–10.3)
Chloride: 107 mmol/L (ref 98–111)
Creatinine, Ser: 1.39 mg/dL — ABNORMAL HIGH (ref 0.61–1.24)
GFR calc Af Amer: 56 mL/min — ABNORMAL LOW (ref >60)
GFR calc non Af Amer: 48 mL/min — ABNORMAL LOW (ref >60)
Glucose, Bld: 107 mg/dL — ABNORMAL HIGH (ref 70–99)
Potassium: 4.2 mmol/L (ref 3.5–5.1)
Sodium: 143 mmol/L (ref 135–145)
Total Bilirubin: 0.5 mg/dL (ref 0.3–1.2)
Total Protein: 6.9 g/dL (ref 6.5–8.1)

## 2017-12-21 LAB — LIPASE, BLOOD: Lipase: 38 U/L (ref 11–51)

## 2017-12-21 LAB — POC OCCULT BLOOD, ED: Fecal Occult Bld: NEGATIVE

## 2017-12-21 MED ORDER — ONDANSETRON HCL 4 MG/2ML IJ SOLN
4.0000 mg | Freq: Once | INTRAMUSCULAR | Status: AC
  Administered 2017-12-21: 4 mg via INTRAVENOUS
  Filled 2017-12-21: qty 2

## 2017-12-21 MED ORDER — SODIUM CHLORIDE 0.9 % IV SOLN
1.0000 g | Freq: Once | INTRAVENOUS | Status: AC
  Administered 2017-12-21: 1 g via INTRAVENOUS
  Filled 2017-12-21: qty 10

## 2017-12-21 MED ORDER — MORPHINE SULFATE (PF) 4 MG/ML IV SOLN
4.0000 mg | Freq: Once | INTRAVENOUS | Status: AC
  Administered 2017-12-21: 4 mg via INTRAVENOUS
  Filled 2017-12-21: qty 1

## 2017-12-21 MED ORDER — SODIUM CHLORIDE 0.9 % IV BOLUS
500.0000 mL | Freq: Once | INTRAVENOUS | Status: AC
  Administered 2017-12-21: 500 mL via INTRAVENOUS

## 2017-12-21 MED ORDER — BELLADONNA ALKALOIDS-OPIUM 16.2-30 MG RE SUPP: 30.0000 mg | Freq: Two times a day (BID) | RECTAL | 0 refills | Status: DC | PRN

## 2017-12-21 MED ORDER — BELLADONNA ALKALOIDS-OPIUM 16.2-60 MG RE SUPP
1.0000 | Freq: Once | RECTAL | Status: AC
  Administered 2017-12-21: 1 via RECTAL
  Filled 2017-12-21: qty 1

## 2017-12-21 MED ORDER — CEFPODOXIME PROXETIL 200 MG PO TABS: 200.0000 mg | ORAL_TABLET | Freq: Two times a day (BID) | ORAL | 0 refills | Status: AC

## 2017-12-21 MED ORDER — SODIUM CHLORIDE 0.9 % IJ SOLN
INTRAMUSCULAR | Status: AC
  Filled 2017-12-21: qty 50

## 2017-12-21 MED ORDER — LEVOFLOXACIN 750 MG PO TABS: 750.0000 mg | ORAL_TABLET | Freq: Every day | ORAL | 0 refills | Status: AC

## 2017-12-21 MED ORDER — LEVOFLOXACIN 750 MG PO TABS: 750.0000 mg | ORAL_TABLET | Freq: Every day | ORAL | 0 refills | Status: DC

## 2017-12-21 MED ORDER — IOPAMIDOL (ISOVUE-300) INJECTION 61%
INTRAVENOUS | Status: AC
  Filled 2017-12-21: qty 100

## 2017-12-21 MED ORDER — IOPAMIDOL (ISOVUE-300) INJECTION 61%
100.0000 mL | Freq: Once | INTRAVENOUS | Status: AC | PRN
  Administered 2017-12-21: 100 mL via INTRAVENOUS

## 2017-12-21 NOTE — ED Provider Notes (Signed)
76 year old male here with abdominal spasms.  Suspect this is secondary to radiation related cystitis and indwelling catheter.  He has similar degree of proctitis from previous CT scans.  He is afebrile and hemodynamically stable.  Will plan to extend his antibiotics, treat supportively.  Patient cleared for discharge pending obtainment of UA at signout.   Duffy Bruce, MD 12/21/17 1723

## 2017-12-21 NOTE — ED Triage Notes (Signed)
Pt complains of lower abdominal pain since receiving radiation for prostate cancer. Pt has urinary catheter and states it is causing pain as well.

## 2017-12-21 NOTE — ED Provider Notes (Signed)
[facility] DEPT Provider Note   CSN: [id number] Arrival date & time: 12/21/17  1224     History   Chief Complaint Chief Complaint  Patient presents with  . Abdominal Pain    HPI Jonathan [name] is a 77 y.o. male.  Patient with known diagnosis of prostate cancer status post seed placement on 09/05/2017 by Dr. Irine Seal presents with lower abdominal abdominal cramping.  Radiation treatment was discontinued approximately 10 days ago.  Patient has had a Foley catheter in place for 9 weeks to let his prostate area heal.  No fever, sweats, chills.  Severity of pain is moderate.  Palpation makes pain worse.     Past Medical History:  Diagnosis Date  . Acquired bladder diverticulum    12/ 2012  resection diverticulum done at [facility] in [place], [place]  . Age-related cataract of right eye    scheduled for catarat extraction 09-12-2017  . Agent orange exposure   . Aneurysm of infrarenal abdominal aorta ([place])    first dx 2017--- last CT 06-11-2017  measures 3.5cm  . Chronic constipation   . COPD with emphysema ([place])    06--12-2017  per pt no symptoms since Feb 2019  . Depression   . Diabetes mellitus type 2, diet-controlled ([place])   . Diabetic neuropathy ([place])   . Dyslipidemia   . Dyspnea on exertion   . Erectile dysfunction   . Feeling of incomplete bladder emptying   . GERD (gastroesophageal reflux disease)   . Gout    09-02-2017 last flare up 3 months ago  . Hiatal hernia   . History of atrial fibrillation    episode post op right lung lobectomy 07-04-2015  . History of bladder stone   . History of colon polyps   . History of DVT of lower extremity    03/ 2019  bilateral lower extremity (superficial) ---  per treated w/ oral medication   . History of gastritis   . History of kidney stones   . History of radiation therapy 09-14-2015  to 09-21-2015   left upper lung nodule -- 54Gy in 3 fractions (18 Gy per fraction)  . Hyperplasia of prostate  with lower urinary tract symptoms (LUTS)   . Hypertension   . Lumbar spinal stenosis   . Nephrolithiasis   . Neurogenic bladder   . Osteoarthritis    ankle, hands  . Osteoporosis   . Prostate cancer [facility]) urologist-  dr wrenn/  oncologist-  dr Tammi Klippel   dx 05-24-2017-- Stage T2b,  Gleason 4+4,  PSA 22.41,  vol 38cc--- started ADT 04/ 2019,  plan external radiation therapy  . Radiation fibrosis of lung ([place])    hx left upper long nodule SBRT 06/ 2017  . Squamous cell carcinoma of both lungs ([place]) last CT in epic dated 06-26-2017 no recurrence   dx 03/ 2017  non-small cell SCC via bronchoscopy w/ bx's by dr Roxan Hockey---  s/p  right VATS w/ right lobectomy and node dissection's 07-04-2015 /  pt had SBRT to left upper nodule Stage 1 ([facility]) completed 09-21-2015  . Wears dentures    bottom  . Wears glasses     Patient Active Problem List   Diagnosis Date Noted  . Malignant neoplasm of prostate ([place]) 07/10/2017  . Arthritis of right subtalar joint 04/18/2017  . 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 Idiopathic chronic venous hypertension of both lower extremities with inflammation 05/22/2016  . Fibrosis of  subtalar joint, right 03/08/2016  . Primary malignant neoplasm of left upper lobe of lung ([place]) 09/21/2015  . Lung cancer ([place]) 07/04/2015  . Diabetes mellitus type II, controlled ([place]) 06/01/2015  . 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 Emphysema of lung ([place])   . Kidney stones   . Osteoporosis   . H/O total ankle replacement, right 04/20/2015  . Carpal tunnel syndrome - right 07/09/2014  . Abdominal bloating 06/14/2014  . History of colonic polyps 06/14/2014  . Diabetic polyneuropathy associated with type 2 diabetes mellitus ([place]) 12/05/2012  . Lesion of ulnar nerve 12/05/2012  . Syncope 11/15/2012  . Gout 11/15/2012  . Constipation  04/02/2009  . CYSTITIS, RECURRENT 12/02/2008  . COPD 10/26/2008  . DIVERTICULUM, BLADDER 10/11/2008  . INSOMNIA 07/24/2008  . ACUTE CYSTITIS 07/20/2008  . FATIGUE 07/20/2008  . DEPRESSION 02/27/2008  . CHEST PAIN UNSPECIFIED 02/27/2008  . NICOTINE ADDICTION 08/12/2007  . DYSLIPIDEMIA 07/22/2007  . OBESITY 07/22/2007  . ERECTILE DYSFUNCTION 07/22/2007  . HYPERTENSION 07/22/2007    Past Surgical History:  Procedure Laterality Date  . CARPAL TUNNEL RELEASE Right 07/09/2014   Procedure: RIGHT OPEN CARPAL TUNNEL RELEASE;  Surgeon: Mcarthur Rossetti, MD;  Location: [facility];  Service: Orthopedics;  Laterality: Right;  . CHOLECYSTECTOMY N/A 02/24/2014   Procedure: LAPAROSCOPIC CHOLECYSTECTOMY;  Surgeon: Coralie Keens, MD;  Location: [facility];  Service: General;  Laterality: N/A;  . COLONOSCOPY    . [facility]ST[facility]  [facility] [place]  . CYSTOSCOPY W/ LITHOLAPAXY / EHL  02-24-2007   dr Janice Norrie  [facility]  . GOLD SEED IMPLANT N/A 09/05/2017   Procedure: GOLD SEED IMPLANT;  Surgeon: Irine Seal, MD;  [facility];  Service: Urology;  Laterality: N/A;  . SPACE OAR INSTILLATION N/A 09/05/2017   Procedure: SPACE OAR INSTILLATION;  Surgeon: Irine Seal, MD;  Location: [facility];  Service: Urology;  Laterality: N/A;  . TOTAL ANKLE ARTHROPLASTY Right 04/20/2015   Procedure: TOTAL ANKLE ARTHOPLASTY;  Surgeon: Newt Minion, MD;  Location: [facility];  Service: Orthopedics;  Laterality: Right;  . TRANSTHORACIC ECHOCARDIOGRAM  11/16/2012   ef 55-60%,  grade 1 diastolic dysfunction/  mild dilated ascending aorta/  mild MR/ trivial TR  . VIDEO ASSISTED THORACOSCOPY (VATS)/ LOBECTOMY Right 07/04/2015   Procedure: VIDEO ASSISTED THORACOSCOPY (VATS)/ RIGHT UPPER LOBECTOMY;  Surgeon: Melrose Nakayama, MD;  Location: [facility];  Service: Thoracic;  Laterality: Right;  [name] VIDEO BRONCHOSCOPY N/A 06/17/2015   Procedure: VIDEO BRONCHOSCOPY;  Surgeon: Melrose Nakayama, MD;  Location: [facility];  Service: Thoracic;  Laterality: N/A;  . VIDEO BRONCHOSCOPY WITH ENDOBRONCHIAL NAVIGATION N/A 06/17/2015   Procedure: VIDEO BRONCHOSCOPY WITH ENDOBRONCHIAL NAVIGATION;  Surgeon: Melrose Nakayama, MD;  Location: [facility];  Service: Thoracic;  Laterality: N/A;  . VIDEO BRONCHOSCOPY WITH ENDOBRONCHIAL ULTRASOUND N/A 06/17/2015   Procedure: VIDEO BRONCHOSCOPY WITH ENDOBRONCHIAL ULTRASOUND;  Surgeon: Melrose Nakayama, MD;  Location: [facility];  Service: Thoracic;  Laterality: N/A;        Home Medications    Prior to Admission medications   Medication Sig Start Date End Date Taking? Authorizing Provider  atenolol (TENORMIN) 25 MG tablet Take 1 tablet (25 mg total) by mouth at bedtime. Patient taking differently: Take 25 mg by mouth daily after breakfast.  07/14/15  Yes Nani Skillern, PA-C  cephALEXin (KEFLEX) 500 MG capsule Take 1 capsule (500 mg total) by mouth 4 (four) times daily. 12/11/17  Yes Molpus, John, MD  ketorolac (ACULAR) 0.4 % SOLN Place 1 drop into the left eye 4 (four) times daily. 11/28/17  Yes [provider]  LANTUS SOLOSTAR 100 UNIT/ML Solostar Pen Inject 5 Units into the skin daily after breakfast.  11/28/17  Yes [provider]  LINZESS 72 MCG capsule Take 72 mcg by mouth daily as needed (constipation).  10/03/17  Yes [provider]  Meth-Hyo-M Bl-Na Phos-Ph Sal (URIBEL) 118 MG CAPS Take 1 tablet by mouth 4 (four) times daily as needed (burning).  09/21/11  Yes [provider]  mirabegron ER (MYRBETRIQ) 25 MG TB24 tablet Take 25 mg by mouth at bedtime.   Yes [provider]  ofloxacin (OCUFLOX) 0.3 % ophthalmic solution Place 1 drop into the left eye 4 (four) times daily. 11/28/17  Yes [provider]  Oxycodone HCl 10 MG TABS Take 10 mg by mouth 4 (four) times daily as needed (pain).  07/18/15  Yes [provider]  prednisoLONE acetate (PRED FORTE) 1 % ophthalmic suspension Place 1  drop into both eyes 2 (two) times daily. 11/28/17  Yes [provider]  predniSONE (DELTASONE) 20 MG tablet Take 20 mg by mouth daily as needed (for gout symptoms).   Yes [provider]  sildenafil (VIAGRA) 100 MG tablet Take 100 mg by mouth daily as needed for erectile dysfunction.    Yes [provider]  SIMBRINZA 1-0.2 % SUSP Place 1 drop into both eyes 4 (four) times daily. 09/24/17  Yes [provider]  tamsulosin (FLOMAX) 0.4 MG CAPS capsule Take 0.8 mg by mouth daily after supper. Reported on 10/12/2015   Yes [provider]    Family History Family History  Problem Relation Age of Onset  . Cancer Father        Asbestos  . Colon cancer Neg Hx   . Colon polyps Neg Hx   . Kidney disease Neg Hx   . Esophageal cancer Neg Hx   . Gallbladder disease Neg Hx   . Heart disease Neg Hx   . Diabetes Neg Hx     Social History Social History   Tobacco Use  . Smoking status: Former Smoker    Packs/day: 0.50    Years: 40.00    Pack years: 20.00    Types: Cigarettes    Last attempt to quit: 02/13/2015    Years since quitting: 2.8  . Smokeless tobacco: Never Used  Substance Use Topics  . Alcohol use: No    Alcohol/week: 0.0 standard drinks  . Drug use: No     Allergies   Lisinopril; Ciprofloxacin; and Levaquin [levofloxacin]   Review of Systems Review of Systems  All other systems reviewed and are negative.    Physical Exam Updated Vital Signs BP (!) 154/79   Pulse 79   Temp 98 F (36.7 C) (Oral)   Resp 17   SpO2 99%   Physical Exam  Constitutional: He is oriented to person, place, and time. He appears well-developed and well-nourished.  HENT:  Head: Normocephalic and atraumatic.  Eyes: Conjunctivae are normal.  Neck: Neck supple.  Cardiovascular: Normal rate and regular rhythm.  Pulmonary/Chest: Effort normal and breath sounds normal.  Abdominal: Soft. Bowel sounds are normal.  Tender suprapubic area.  Genitourinary:    Genitourinary Comments: Rectal exam: No masses.  Nontender.  Heme-negative  Musculoskeletal: Normal range of motion.  Neurological: He is alert and oriented to person, place, and time.  Skin: Skin is warm and dry.  Psychiatric: He has a normal mood and affect. His behavior is normal.  Nursing note and vitals reviewed.    ED Treatments / Results  Labs (all labs ordered are listed, but only abnormal results are displayed) Labs Reviewed  CBC WITH DIFFERENTIAL/PLATELET - Abnormal; Notable for the following components:      Result Value   RBC 3.78 (*)    Hemoglobin 11.7 (*)    HCT 35.0 (*)    All other components within normal limits  COMPREHENSIVE METABOLIC PANEL - Abnormal; Notable for the following components:   Glucose, Bld 107 (*)    Creatinine, Ser 1.39 (*)    Albumin 3.4 (*)    GFR calc non Af Amer 48 (*)    GFR calc Af Amer 56 (*)    All other components within normal limits  URINE CULTURE  LIPASE, BLOOD  URINALYSIS, ROUTINE W REFLEX MICROSCOPIC  POC OCCULT BLOOD, ED    EKG None  Radiology Ct Abdomen Pelvis W Contrast  Result Date: 12/21/2017 CLINICAL DATA:  Pelvic pain EXAM: CT ABDOMEN AND PELVIS WITH CONTRAST TECHNIQUE: Multidetector CT imaging of the abdomen and pelvis was performed using the standard protocol following bolus administration of intravenous contrast. CONTRAST:  184mL ISOVUE-300 IOPAMIDOL (ISOVUE-300) INJECTION 61% COMPARISON:  CT 12/11/2017, 06/11/2017, 02/07/2017 FINDINGS: Lower chest: Lung bases demonstrate scarring or atelectasis at the right base. No acute consolidation or pleural effusion. The heart size is within normal limits. Hepatobiliary: Subcentimeter hypodense liver lesions, too small to further characterize. Status post cholecystectomy. No significant biliary dilatation Pancreas: Unremarkable. No pancreatic ductal dilatation or surrounding inflammatory changes. Spleen: Normal in size without focal abnormality. Adrenals/Urinary Tract: Adrenal  glands are within normal limits. Kidneys show no hydronephrosis. Bilateral renal cysts. Subcentimeter renal hypodense lesions too small to further characterize. Possible small stone mid to lower left kidney. Previously noted catheter in the bladder is not seen. Diffuse irregular wall thickening of the bladder with surrounding edema. Stomach/Bowel: Stomach is nonenlarged. No dilated small bowel. Negative appendix. Focal wall thickening of the anterior rectum with low-density intramural area measuring 23 x 11 mm, not significantly changed since 12/11/2017. Vascular/Lymphatic: Mild aortic atherosclerosis. Mild aneurysmal dilatation of the infrarenal abdominal aorta measuring up to 3.5 cm. No significantly enlarged lymph nodes. Reproductive: Prostate calcification with post treatment change. Other: No free air. Similar appearance perirectal and presacral edema/soft tissue stranding. Musculoskeletal: No acute osseous abnormality. Degenerative changes. Few small lucent lesions in the left iliac bone are unchanged. IMPRESSION: 1. The previously noted urinary catheter is not identified on the current study. There remains diffuse irregular and slightly asymmetric wall thickening of the bladder with surrounding edema consistent with cystitis and or radiation changes. 2. Similar appearance of focal anterior wall thickening of the rectum since 12/11/2017, question secondary to radiation changes versus small intramural fluid collection. 3. Possible small stone in the left kidney 4. Small infrarenal abdominal aortic aneurysm up to 3.5 cm. Recommend followup by ultrasound in 2 years. This recommendation follows ACR consensus guidelines: White Paper of the ACR Incidental Findings Committee II on Vascular Findings. J Am Coll Radiol 2013; 10:789-794. Electronically Signed   By: Donavan Foil M.D.   On: 12/21/2017 15:40    Procedures Procedures (including critical care time)  Medications Ordered in ED Medications  iopamidol  (ISOVUE-300) 61 % injection (has no administration in time range)  sodium chloride 0.9 % injection (has  no administration in time range)  sodium chloride 0.9 % bolus 500 mL (0 mLs Intravenous Stopped 12/21/17 1437)  morphine 4 MG/ML injection 4 mg (4 mg Intravenous Given 12/21/17 1354)  ondansetron (ZOFRAN) injection 4 mg (4 mg Intravenous Given 12/21/17 1354)  cefTRIAXone (ROCEPHIN) 1 g in sodium chloride 0.9 % 100 mL IVPB (0 g Intravenous Stopped 12/21/17 1437)  iopamidol (ISOVUE-300) 61 % injection 100 mL (100 mLs Intravenous Contrast Given 12/21/17 1445)  morphine 4 MG/ML injection 4 mg (4 mg Intravenous Given 12/21/17 1541)     Initial Impression / Assessment and Plan / ED Course  I have reviewed the triage vital signs and the nursing notes.  Pertinent labs & imaging results that were available during my care of the patient were reviewed by me and considered in my medical decision making (see chart for details).     Patient presents with lower abdominal cramping and spasms.  He has a known diagnosis of prostate cancer and has had radiation treatment this summer.  His Foley catheter has been in place for 9 weeks.   White count today normal.  Urinalysis pending.  CT scan reveals inflammation in the bladder and rectum along with a 3.5 cm infrarenal aortic aneurysm.  1530: Patient is unable to urinate on his own.  Will reinsert Foley catheter.  Urinalysis pending.  Discussed with Dr. Ellender Hose.  Final Clinical Impressions(s) / ED Diagnoses   Final diagnoses:  Abdominal pain, unspecified abdominal location  Problem with Foley catheter, initial encounter [facility])  Prostate CA ([place])  Abdominal aortic aneurysm (AAA) without rupture [facility])    ED Discharge Orders    None       Nat Christen, MD 12/21/17 1547

## 2017-12-24 LAB — URINE CULTURE: Culture: >=100000 — AB

## 2017-12-25 ENCOUNTER — Telehealth: Payer: Self-pay | Admitting: Emergency Medicine

## 2017-12-25 ENCOUNTER — Telehealth: Payer: Self-pay | Admitting: *Deleted

## 2017-12-25 DIAGNOSIS — Z510 Encounter for antineoplastic radiation therapy: Secondary | ICD-10-CM | POA: Diagnosis not present

## 2017-12-25 DIAGNOSIS — C61 Malignant neoplasm of prostate: Secondary | ICD-10-CM | POA: Diagnosis not present

## 2017-12-25 NOTE — Telephone Encounter (Signed)
CM contacted by Maudie Mercury from pt placement regarding medication needs of pt from an ED encounter on 9/28.  She reports pt has been unable to find a pharmacy that carries belladona suppositories.  His primary pharmacy [facility] had contacted several other pharmacies but were unable to assist.  CM contacted [facility] who advised they could get the prescription for the pt.  CM contacted [facility] pharmacy for recommendations on pharmacies in the area that may carry the drug.  They advised to call [facility].  CM contacted [facility] who stated they did not carry it.  CM contacted [facility] who advised they did not carry it.  CM contacted pt who advised he found that the [employer] will send him the prescription via mail but he needs the prescription sent to them.  CM advised that because the medication is an opiate, the prescription cannot be re-sent.  Pt can request [facility] transfer the prescription to the [facility] pharmacy.  CM additionally advised that if pt has any further issues, his PCP or oncologist could potentially write a new prescription.  Pt reports he see's his oncologist on 01/04/2018.  No further CM needs noted at this time.

## 2017-12-25 NOTE — Telephone Encounter (Signed)
Spoke with patient who states he was given Rx for belladonna suppositories at 12/21/17 [facility] ED visit which were effective in treating his pain.  Patient was unable to find medication at local pharmacy and was requesting assistance.  Spoke with Levada Dy, Care Manager with [facility] ED who will follow up in assisting patient with his meds.

## 2017-12-25 NOTE — Progress Notes (Signed)
ED Antimicrobial Stewardship Positive Culture Follow Up   Jonathan [name] is an 77 y.o. male who presented to [facility] on 12/21/2017 with a chief complaint of lower abdominal pain and cramping. Patient has a history of prostate cancer undergoing radiation. Urinary catheter was placed around 9 weeks ago.  Chief Complaint  Patient presents with  . Abdominal Pain    Recent Results (from the past 720 hour(s))  Urine culture     Status: Abnormal   Collection Time: 12/11/17  1:38 AM  Result Value Ref Range Status   Specimen Description   Final    URINE, CLEAN CATCH Performed at [facility], [place] [address], [place], [place] [address]    Special Requests   Final    NONE Performed at [facility], [place] [address], [place], [place] [address]    Culture (A)  Final    >=100,000 COLONIES/mL KLEBSIELLA PNEUMONIAE >=100,000 COLONIES/mL PSEUDOMONAS AERUGINOSA    Report Status 12/14/2017 FINAL  Final   Organism ID, Bacteria KLEBSIELLA PNEUMONIAE (A)  Final   Organism ID, Bacteria PSEUDOMONAS AERUGINOSA (A)  Final      Susceptibility   Klebsiella pneumoniae - MIC*    AMPICILLIN >=32 RESISTANT Resistant     CEFAZOLIN <=4 SENSITIVE Sensitive     CEFTRIAXONE <=1 SENSITIVE Sensitive     CIPROFLOXACIN <=0.25 SENSITIVE Sensitive     GENTAMICIN <=1 SENSITIVE Sensitive     IMIPENEM <=0.25 SENSITIVE Sensitive     NITROFURANTOIN 32 SENSITIVE Sensitive     TRIMETH/SULFA <=20 SENSITIVE Sensitive     AMPICILLIN/SULBACTAM 4 SENSITIVE Sensitive     PIP/TAZO 8 SENSITIVE Sensitive     Extended ESBL NEGATIVE Sensitive     * >=100,000 COLONIES/mL KLEBSIELLA PNEUMONIAE   Pseudomonas aeruginosa - MIC*    CEFTAZIDIME 2 SENSITIVE Sensitive     CIPROFLOXACIN <=0.25 SENSITIVE Sensitive     GENTAMICIN <=1 SENSITIVE Sensitive     IMIPENEM 1 SENSITIVE Sensitive     PIP/TAZO <=4 SENSITIVE Sensitive     CEFEPIME 2 SENSITIVE Sensitive     * >=100,000 COLONIES/mL PSEUDOMONAS  AERUGINOSA  Urine culture     Status: Abnormal   Collection Time: 12/21/17  1:14 PM  Result Value Ref Range Status   Specimen Description   Final    URINE, RANDOM Performed at [facility], [place] [address], [place], [place] [address]    Special Requests   Final    NONE Performed at [facility], [place] [address], [place], [place] [address]    Culture >=100,000 COLONIES/mL PSEUDOMONAS AERUGINOSA (A)  Final   Report Status 12/24/2017 FINAL  Final   Organism ID, Bacteria PSEUDOMONAS AERUGINOSA (A)  Final      Susceptibility   Pseudomonas aeruginosa - MIC*    CEFTAZIDIME 2 SENSITIVE Sensitive     CIPROFLOXACIN <=0.25 SENSITIVE Sensitive     GENTAMICIN <=1 SENSITIVE Sensitive     IMIPENEM <=0.25 SENSITIVE Sensitive     PIP/TAZO 8 SENSITIVE Sensitive     CEFEPIME 2 SENSITIVE Sensitive     * >=100,000 COLONIES/mL PSEUDOMONAS AERUGINOSA   Patient discharged with cefpodoxime and levofloxacin x 7 days.  Recommend to call patient to evaluate improvement of symptoms or if any new symptoms are present (urinary frequency, pain, hematuria). Continue levofloxacin 750 mg daily for a total of 7 days and discontinue cefpodoxime.  ED Provider: Arlean Hopping, PA-C  Vertis Kelch, PharmD PGY1 Pharmacy Resident Phone [phone] 12/25/2017  9:27 AM  Clinical Pharmacist Monday - Friday phone -  [phone] Saturday - Sunday phone - [phone]

## 2017-12-25 NOTE — Telephone Encounter (Signed)
Post ED Visit - Positive Culture Follow-up: Successful Patient Follow-Up  Culture assessed and recommendations reviewed by:  []  Elenor Quinones, Pharm.D. []  Heide Guile, Pharm.D., BCPS AQ-ID []  Parks Neptune, Pharm.D., BCPS []  Alycia Rossetti, Pharm.D., BCPS []  [place], [place].D., BCPS, AAHIVP []  Legrand Como, Pharm.D., BCPS, AAHIVP []  Salome Arnt, PharmD, BCPS []  Johnnette Gourd, PharmD, BCPS []  Hughes Better, PharmD, BCPS []  Leeroy Cha, PharmD Vertis Kelch, Pharm D  Positive urine culture  []  Patient discharged without antimicrobial prescription and treatment is now indicated [x]  Organism is resistant to prescribed ED discharge antimicrobial []  Patient with positive blood cultures  Changes discussed with ED provider Arlean Hopping, PA-C Stop Cefpodoxime , continue Levofloxacin  Contacted patient, date 12/25/17, time [place], [place] 12/25/2017, 11:27 AM

## 2017-12-26 MED FILL — BELLADONNA-OPIUM 16.2-60 SU: 16.2-60 | 7 days supply | Qty: 30 | Fill #0

## 2017-12-27 DIAGNOSIS — M79672 Pain in left foot: Secondary | ICD-10-CM | POA: Diagnosis not present

## 2017-12-27 DIAGNOSIS — B351 Tinea unguium: Secondary | ICD-10-CM | POA: Diagnosis not present

## 2017-12-27 DIAGNOSIS — M19071 Primary osteoarthritis, right ankle and foot: Secondary | ICD-10-CM | POA: Diagnosis not present

## 2017-12-27 DIAGNOSIS — E1351 Other specified diabetes mellitus with diabetic peripheral angiopathy without gangrene: Secondary | ICD-10-CM | POA: Diagnosis not present

## 2017-12-27 DIAGNOSIS — M79671 Pain in right foot: Secondary | ICD-10-CM | POA: Diagnosis not present

## 2018-01-03 ENCOUNTER — Other Ambulatory Visit (HOSPITAL_COMMUNITY)
Admission: RE | Admit: 2018-01-03 | Discharge: 2018-01-03 | Disposition: A | Discharge status: Home / Self Care | Payer: Medicare Other | Source: Other Acute Inpatient Hospital | Attending: Urology | Admitting: Urology

## 2018-01-03 ENCOUNTER — Ambulatory Visit (INDEPENDENT_AMBULATORY_CARE_PROVIDER_SITE_OTHER): Payer: Medicare Other | Admitting: Urology

## 2018-01-03 ENCOUNTER — Other Ambulatory Visit: Payer: Self-pay | Admitting: Urology

## 2018-01-03 DIAGNOSIS — N319 Neuromuscular dysfunction of bladder, unspecified: Secondary | ICD-10-CM | POA: Diagnosis not present

## 2018-01-03 DIAGNOSIS — N403 Nodular prostate with lower urinary tract symptoms: Secondary | ICD-10-CM | POA: Diagnosis not present

## 2018-01-03 DIAGNOSIS — R338 Other retention of urine: Secondary | ICD-10-CM | POA: Diagnosis not present

## 2018-01-04 LAB — URINE CULTURE: Culture: NO GROWTH

## 2018-01-09 ENCOUNTER — Ambulatory Visit (INDEPENDENT_AMBULATORY_CARE_PROVIDER_SITE_OTHER): Payer: Medicare Other | Admitting: Orthopedic Surgery

## 2018-01-09 ENCOUNTER — Other Ambulatory Visit: Payer: Self-pay | Admitting: Student

## 2018-01-10 ENCOUNTER — Ambulatory Visit (HOSPITAL_COMMUNITY): Payer: Medicare Other

## 2018-01-10 ENCOUNTER — Ambulatory Visit (HOSPITAL_COMMUNITY): Admission: RE | Admit: 2018-01-10 | Payer: Medicare Other | Source: Ambulatory Visit

## 2018-01-14 DIAGNOSIS — C61 Malignant neoplasm of prostate: Secondary | ICD-10-CM | POA: Diagnosis not present

## 2018-01-14 DIAGNOSIS — R972 Elevated prostate specific antigen [PSA]: Secondary | ICD-10-CM | POA: Diagnosis not present

## 2018-01-14 DIAGNOSIS — I10 Essential (primary) hypertension: Secondary | ICD-10-CM | POA: Diagnosis not present

## 2018-01-14 DIAGNOSIS — K5901 Slow transit constipation: Secondary | ICD-10-CM | POA: Diagnosis not present

## 2018-01-14 DIAGNOSIS — R1084 Generalized abdominal pain: Secondary | ICD-10-CM | POA: Diagnosis not present

## 2018-01-14 DIAGNOSIS — R413 Other amnesia: Secondary | ICD-10-CM | POA: Diagnosis not present

## 2018-01-14 DIAGNOSIS — M25571 Pain in right ankle and joints of right foot: Secondary | ICD-10-CM | POA: Diagnosis not present

## 2018-01-14 DIAGNOSIS — R531 Weakness: Secondary | ICD-10-CM | POA: Diagnosis not present

## 2018-01-14 DIAGNOSIS — E0865 Diabetes mellitus due to underlying condition with hyperglycemia: Secondary | ICD-10-CM | POA: Diagnosis not present

## 2018-01-14 DIAGNOSIS — N3090 Cystitis, unspecified without hematuria: Secondary | ICD-10-CM | POA: Diagnosis not present

## 2018-01-14 DIAGNOSIS — E1143 Type 2 diabetes mellitus with diabetic autonomic (poly)neuropathy: Secondary | ICD-10-CM | POA: Diagnosis not present

## 2018-01-14 DIAGNOSIS — R300 Dysuria: Secondary | ICD-10-CM | POA: Diagnosis not present

## 2018-01-14 DIAGNOSIS — Z6828 Body mass index (BMI) 28.0-28.9, adult: Secondary | ICD-10-CM | POA: Diagnosis not present

## 2018-01-16 ENCOUNTER — Encounter (HOSPITAL_COMMUNITY): Payer: Self-pay | Admitting: Emergency Medicine

## 2018-01-16 ENCOUNTER — Ambulatory Visit (INDEPENDENT_AMBULATORY_CARE_PROVIDER_SITE_OTHER): Payer: Medicare Other | Admitting: Orthopedic Surgery

## 2018-01-16 ENCOUNTER — Emergency Department (HOSPITAL_COMMUNITY)
Admission: EM | Admit: 2018-01-16 | Discharge: 2018-01-17 | Disposition: A | Discharge status: Home / Self Care | Payer: Medicare Other | Attending: Emergency Medicine | Admitting: Emergency Medicine

## 2018-01-16 ENCOUNTER — Other Ambulatory Visit: Payer: Self-pay

## 2018-01-16 DIAGNOSIS — Z87891 Personal history of nicotine dependence: Secondary | ICD-10-CM | POA: Diagnosis not present

## 2018-01-16 DIAGNOSIS — Z85118 Personal history of other malignant neoplasm of bronchus and lung: Secondary | ICD-10-CM | POA: Diagnosis not present

## 2018-01-16 DIAGNOSIS — E119 Type 2 diabetes mellitus without complications: Secondary | ICD-10-CM | POA: Insufficient documentation

## 2018-01-16 DIAGNOSIS — J449 Chronic obstructive pulmonary disease, unspecified: Secondary | ICD-10-CM | POA: Diagnosis not present

## 2018-01-16 DIAGNOSIS — Z79899 Other long term (current) drug therapy: Secondary | ICD-10-CM | POA: Diagnosis not present

## 2018-01-16 DIAGNOSIS — T839XXA Unspecified complication of genitourinary prosthetic device, implant and graft, initial encounter: Secondary | ICD-10-CM

## 2018-01-16 DIAGNOSIS — Z466 Encounter for fitting and adjustment of urinary device: Secondary | ICD-10-CM | POA: Diagnosis not present

## 2018-01-16 DIAGNOSIS — I10 Essential (primary) hypertension: Secondary | ICD-10-CM | POA: Diagnosis not present

## 2018-01-16 DIAGNOSIS — Z8546 Personal history of malignant neoplasm of prostate: Secondary | ICD-10-CM | POA: Insufficient documentation

## 2018-01-16 DIAGNOSIS — T83098A Other mechanical complication of other indwelling urethral catheter, initial encounter: Secondary | ICD-10-CM | POA: Diagnosis not present

## 2018-01-16 NOTE — ED Triage Notes (Addendum)
Pt from home with c/o urinary infection. Pt was called by his PCP and told to come here for treatment of his UTI. Pt reports lightheadedness x 3 days. No neuro impairments visible. Pt states he is on bactrim from PCP for UTI. Pt has hx of prostate cancer for which he has been receiving radiation. Pt states he has been suffering from loneliness at home but denies wanting resources because his caregiver "doesn't want to be here all night". Pt reports feeling safe at home- just lonely.

## 2018-01-17 ENCOUNTER — Other Ambulatory Visit: Payer: Self-pay

## 2018-01-17 NOTE — ED Provider Notes (Signed)
[facility] DEPT Provider Note   CSN: [id number] Arrival date & time: 01/16/18  1925     History   Chief Complaint Chief Complaint  Patient presents with  . Urinary Tract Infection    HPI Jonathan [name] is a 77 y.o. male.  Patient presents to the emergency department with a chief complaint of requesting Foley catheter exchange.  He states that he was seen by his PCP today, and was told that he had a UTI.  His doctor recommended that he have his Foley catheter changed.  His catheter has been in place for almost 30 days.  He denies any fevers chills.  He states that he has had some urethral irritation.  He denies seeing any blood or discharge.  He is also followed by [facility] for prostate cancer.  States that he is scheduled to have a suprapubic catheter placed, but is still deciding on whether he wants to proceed with this.  In the meantime, he has had a chronic Foley catheter.  He denies any abdominal pain.  Denies any other associated symptoms.  His PCP put him on Bactrim.  The history is provided by the patient. No language interpreter was used.    Past Medical History:  Diagnosis Date  . Acquired bladder diverticulum    12/ 2012  resection diverticulum done at [facility] in [place], [place]  . Age-related cataract of right eye    scheduled for catarat extraction 09-12-2017  . Agent orange exposure   . Aneurysm of infrarenal abdominal aorta ([place])    first dx 2017--- last CT 06-11-2017  measures 3.5cm  . Chronic constipation   . COPD with emphysema ([place])    06--12-2017  per pt no symptoms since Feb 2019  . Depression   . Diabetes mellitus type 2, diet-controlled ([place])   . Diabetic neuropathy ([place])   . Dyslipidemia   . Dyspnea on exertion   . Erectile dysfunction   . Feeling of incomplete bladder emptying   . GERD (gastroesophageal reflux disease)   . Gout    09-02-2017 last flare up 3 months ago  . Hiatal hernia   . History of atrial  fibrillation    episode post op right lung lobectomy 07-04-2015  . History of bladder stone   . History of colon polyps   . History of DVT of lower extremity    03/ 2019  bilateral lower extremity (superficial) ---  per treated w/ oral medication   . History of gastritis   . History of kidney stones   . History of radiation therapy 09-14-2015  to 09-21-2015   left upper lung nodule -- 54Gy in 3 fractions (18 Gy per fraction)  . Hyperplasia of prostate with lower urinary tract symptoms (LUTS)   . Hypertension   . Lumbar spinal stenosis   . Nephrolithiasis   . Neurogenic bladder   . Osteoarthritis    ankle, hands  . Osteoporosis   . Prostate cancer [facility]) urologist-  dr wrenn/  oncologist-  dr Tammi Klippel   dx 05-24-2017-- Stage T2b,  Gleason 4+4,  PSA 22.41,  vol 38cc--- started ADT 04/ 2019,  plan external radiation therapy  . Radiation fibrosis of lung ([place])    hx left upper long nodule SBRT 06/ 2017  . Squamous cell carcinoma of both lungs ([place]) last CT in epic dated 06-26-2017 no recurrence   dx 03/ 2017  non-small cell SCC via bronchoscopy w/ bx's by dr Roxan Hockey---  s/p  right VATS  w/ right lobectomy and node dissection's 07-04-2015 /  pt had SBRT to left upper nodule Stage 1 ([facility]) completed 09-21-2015  . Wears dentures    bottom  . Wears glasses     Patient Active Problem List   Diagnosis Date Noted  . Malignant neoplasm of prostate ([employer]) 07/10/2017  . Arthritis of right subtalar joint 04/18/2017  . 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 Idiopathic chronic venous hypertension of both lower extremities with inflammation 05/22/2016  . Fibrosis of subtalar joint, right 03/08/2016  . Primary malignant neoplasm of left upper lobe of lung ([place]) 09/21/2015  . Lung cancer ([place]) 07/04/2015  . Diabetes mellitus type II, controlled ([place]) 06/01/2015  . 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 Neoplasm of uncertain  behavior of left upper lobe of lung 05/31/2015  . Emphysema of lung ([place])   . Kidney stones   . Osteoporosis   . H/O total ankle replacement, right 04/20/2015  . Carpal tunnel syndrome - right 07/09/2014  . Abdominal bloating 06/14/2014  . History of colonic polyps 06/14/2014  . Diabetic polyneuropathy associated with type 2 diabetes mellitus ([place]) 12/05/2012  . Lesion of ulnar nerve 12/05/2012  . Syncope 11/15/2012  . Gout 11/15/2012  . Constipation 04/02/2009  . CYSTITIS, RECURRENT 12/02/2008  . COPD 10/26/2008  . DIVERTICULUM, BLADDER 10/11/2008  . INSOMNIA 07/24/2008  . ACUTE CYSTITIS 07/20/2008  . FATIGUE 07/20/2008  . DEPRESSION 02/27/2008  . CHEST PAIN UNSPECIFIED 02/27/2008  . NICOTINE ADDICTION 08/12/2007  . DYSLIPIDEMIA 07/22/2007  . OBESITY 07/22/2007  . ERECTILE DYSFUNCTION 07/22/2007  . HYPERTENSION 07/22/2007    Past Surgical History:  Procedure Laterality Date  . CARPAL TUNNEL RELEASE Right 07/09/2014   Procedure: RIGHT OPEN CARPAL TUNNEL RELEASE;  Surgeon: Mcarthur Rossetti, MD;  Location: [facility];  Service: Orthopedics;  Laterality: Right;  . CHOLECYSTECTOMY N/A 02/24/2014   Procedure: LAPAROSCOPIC CHOLECYSTECTOMY;  Surgeon: Coralie Keens, MD;  Location: [facility];  Service: General;  Laterality: N/A;  . COLONOSCOPY    . CYSTOLITHOT[facility]  11[facility] [place]  . CYSTOSCOPY W/ LITHOLAPAXY / EHL  02-24-2007   dr Janice Norrie  [facility]  . GOLD SEED IMPLANT N/A 09/05/2017   Procedure: GOLD SEED IMPLANT;  Surgeon: Irine Seal, MD;  Location: [facility];  Service: Urology;  Laterality: N/A;  . SPACE OAR INSTILLATION N/A 09/05/2017   Procedure: SPACE OAR INSTILLATION;  Surgeon: Irine Seal, MD;  Location: [facility];  Service: Urology;  Laterality: N/A;  . TOTAL ANKLE ARTHROPLASTY Right 04/20/2015   Procedure: TOTAL ANKLE ARTHOPLASTY;  Surgeon: Newt Minion, MD;  Location: [facility];  Service: Orthopedics;  Laterality:  Right;  . TRANSTHORACIC ECHOCARDIOGRAM  11/16/2012   ef 55-60%,  grade 1 diastolic dysfunction/  mild dilated ascending aorta/  mild MR/ trivial TR  . VIDEO ASSISTED THORACOSCOPY (VATS)/ LOBECTOMY Right 07/04/2015   Procedure: VIDEO ASSISTED THORACOSCOPY (VATS)/ RIGHT UPPER LOBECTOMY;  Surgeon: Melrose Nakayama, MD;  Location: [facility];  Service: Thoracic;  Laterality: Right;  [name] VIDEO BRONCHOSCOPY N/A 06/17/2015   Procedure: VIDEO BRONCHOSCOPY;  Surgeon: Melrose Nakayama, MD;  Location: [facility];  Service: Thoracic;  Laterality: N/A;  . VIDEO BRONCHOSCOPY WITH ENDOBRONCHIAL NAVIGATION N/A 06/17/2015   Procedure: VIDEO BRONCHOSCOPY WITH ENDOBRONCHIAL NAVIGATION;  Surgeon: Melrose Nakayama, MD;  Location: [facility];  Service: Thoracic;  Laterality: N/A;  . VIDEO  BRONCHOSCOPY WITH ENDOBRONCHIAL ULTRASOUND N/A 06/17/2015   Procedure: VIDEO BRONCHOSCOPY WITH ENDOBRONCHIAL ULTRASOUND;  Surgeon: Melrose Nakayama, MD;  Location: [facility];  Service: Thoracic;  Laterality: N/A;        Home Medications    Prior to Admission medications   Medication Sig Start Date End Date Taking? Authorizing Provider  atenolol (TENORMIN) 25 MG tablet Take 1 tablet (25 mg total) by mouth at bedtime. Patient taking differently: Take 25 mg by mouth daily after breakfast.  07/14/15  Yes Tacy Dura, Donielle M, PA-C  belladonna-opium (B&O SUPPRETTES) 16.2-30 MG suppository Place 1 suppository (30 mg total) rectally 2 (two) times daily as needed for pain (rectal spasms). 12/21/17  Yes Duffy Bruce, MD  ketorolac (ACULAR) 0.4 % SOLN Place 1 drop into the left eye 4 (four) times daily. 11/28/17  Yes [provider]  LANTUS SOLOSTAR 100 UNIT/ML Solostar Pen Inject 5 Units into the skin daily after breakfast.  11/28/17  Yes [provider]  LINZESS 72 MCG capsule Take 72 mcg by mouth daily as needed (constipation).  10/03/17  Yes [provider]  Meth-Hyo-M Bl-Na Phos-Ph Sal (URIBEL) 118 MG CAPS Take 1  tablet by mouth 4 (four) times daily as needed (burning).  09/21/11  Yes [provider]  mirabegron ER (MYRBETRIQ) 25 MG TB24 tablet Take 25 mg by mouth at bedtime.   Yes [provider]  ofloxacin (OCUFLOX) 0.3 % ophthalmic solution Place 1 drop into the left eye 4 (four) times daily. 11/28/17  Yes [provider]  Oxycodone HCl 10 MG TABS Take 10 mg by mouth 4 (four) times daily as needed (pain).  07/18/15  Yes [provider]  prednisoLONE acetate (PRED FORTE) 1 % ophthalmic suspension Place 1 drop into both eyes 2 (two) times daily. 11/28/17  Yes [provider]  predniSONE (DELTASONE) 20 MG tablet Take 20 mg by mouth daily as needed (for gout symptoms).   Yes [provider]  SIMBRINZA 1-0.2 % SUSP Place 1 drop into both eyes 4 (four) times daily. 09/24/17  Yes [provider]  sulfamethoxazole-trimethoprim (BACTRIM DS,SEPTRA DS) 800-160 MG tablet Take 1 tablet by mouth 2 (two) times daily.   Yes [provider]  tamsulosin (FLOMAX) 0.4 MG CAPS capsule Take 0.8 mg by mouth daily after supper. Reported on 10/12/2015   Yes [provider]  timolol (TIMOPTIC) 0.5 % ophthalmic solution Place 1 drop into both eyes daily. 01/09/18  Yes [provider]  cephALEXin (KEFLEX) 500 MG capsule Take 1 capsule (500 mg total) by mouth 4 (four) times daily. Patient not taking: Reported on 01/17/2018 12/11/17   Molpus, John, MD  sildenafil (VIAGRA) 100 MG tablet Take 100 mg by mouth daily as needed for erectile dysfunction.     [provider]    Family History Family History  Problem Relation Age of Onset  . Cancer Father        Asbestos  . Colon cancer Neg Hx   . Colon polyps Neg Hx   . Kidney disease Neg Hx   . Esophageal cancer Neg Hx   . Gallbladder disease Neg Hx   . Heart disease Neg Hx   . Diabetes Neg Hx     Social History Social History   Tobacco Use  . Smoking status: Former Smoker    Packs/day:  0.50    Years: 40.00    Pack years: 20.00    Types: Cigarettes    Last attempt to quit: 02/13/2015  Years since quitting: 2.9  . Smokeless tobacco: Never Used  Substance Use Topics  . Alcohol use: No    Alcohol/week: 0.0 standard drinks  . Drug use: No     Allergies   Lisinopril; Ciprofloxacin; and Levaquin [levofloxacin]   Review of Systems Review of Systems  All other systems reviewed and are negative.    Physical Exam Updated Vital Signs BP (!) 145/80 (BP Location: Left Arm)   Pulse 74   Temp 98.2 F (36.8 C) (Oral)   Resp 18   Ht 6\' 8"  (2.032 m)   Wt 114.8 kg   SpO2 98%   BMI 27.79 kg/m   Physical Exam  Constitutional: He is oriented to person, place, and time. He appears well-developed and well-nourished.  HENT:  Head: Normocephalic and atraumatic.  Eyes: Pupils are equal, round, and reactive to light. Conjunctivae and EOM are normal. Right eye exhibits no discharge. Left eye exhibits no discharge. No scleral icterus.  Neck: Normal range of motion. Neck supple. No JVD present.  Cardiovascular: Normal rate, regular rhythm and normal heart sounds. Exam reveals no gallop and no friction rub.  No murmur heard. Pulmonary/Chest: Effort normal and breath sounds normal. No respiratory distress. He has no wheezes. He has no rales. He exhibits no tenderness.  Abdominal: Soft. He exhibits no distension and no mass. There is no tenderness. There is no rebound and no guarding.  Genitourinary:  Genitourinary Comments: Foley catheter in place, no discharge or blood seen at the urethral meatus  Musculoskeletal: Normal range of motion. He exhibits no edema or tenderness.  Neurological: He is alert and oriented to person, place, and time.  Skin: Skin is warm and dry.  Psychiatric: He has a normal mood and affect. His behavior is normal. Judgment and thought content normal.  Nursing note and vitals reviewed.    ED Treatments / Results  Labs (all labs ordered are listed,  but only abnormal results are displayed) Labs Reviewed - No data to display  EKG None  Radiology No results found.  Procedures Procedures (including critical care time)  Medications Ordered in ED Medications - No data to display   Initial Impression / Assessment and Plan / ED Course  I have reviewed the triage vital signs and the nursing notes.  Pertinent labs & imaging results that were available during my care of the patient were reviewed by me and considered in my medical decision making (see chart for details).    Patient sent to ED by PCP to have Foley catheter changed.  He denies any abdominal pain, fever, or vomiting.  He is taking Bactrim for UTI as diagnosed by his PCP.  He is also followed by [facility] and has close follow-up with them.  Will exchange catheter and discharge.  Final Clinical Impressions(s) / ED Diagnoses   Final diagnoses:  Problem with Foley catheter, initial encounter [facility])    ED Discharge Orders    None       Montine Circle, PA-C 01/17/18 0554    Orpah Greek, MD 01/17/18 0630

## 2018-01-17 NOTE — ED Notes (Signed)
Patient refusing blood work at this time, states "I just need my catheter changed" Provider informed

## 2018-01-20 ENCOUNTER — Other Ambulatory Visit: Payer: Self-pay | Admitting: Urology

## 2018-01-20 DIAGNOSIS — R338 Other retention of urine: Secondary | ICD-10-CM

## 2018-01-24 ENCOUNTER — Other Ambulatory Visit: Payer: Self-pay | Admitting: Radiology

## 2018-01-27 ENCOUNTER — Other Ambulatory Visit: Payer: Self-pay

## 2018-01-27 ENCOUNTER — Ambulatory Visit (HOSPITAL_COMMUNITY)
Admission: RE | Admit: 2018-01-27 | Discharge: 2018-01-27 | Disposition: A | Discharge status: Home / Self Care | Payer: Medicare Other | Source: Ambulatory Visit | Attending: Urology | Admitting: Urology

## 2018-01-27 ENCOUNTER — Encounter (HOSPITAL_COMMUNITY): Payer: Self-pay

## 2018-01-27 ENCOUNTER — Ambulatory Visit (INDEPENDENT_AMBULATORY_CARE_PROVIDER_SITE_OTHER): Payer: Medicare Other | Admitting: Orthopedic Surgery

## 2018-01-27 DIAGNOSIS — E1140 Type 2 diabetes mellitus with diabetic neuropathy, unspecified: Secondary | ICD-10-CM | POA: Insufficient documentation

## 2018-01-27 DIAGNOSIS — R338 Other retention of urine: Secondary | ICD-10-CM | POA: Insufficient documentation

## 2018-01-27 DIAGNOSIS — Z794 Long term (current) use of insulin: Secondary | ICD-10-CM | POA: Insufficient documentation

## 2018-01-27 DIAGNOSIS — Z79899 Other long term (current) drug therapy: Secondary | ICD-10-CM | POA: Insufficient documentation

## 2018-01-27 DIAGNOSIS — C61 Malignant neoplasm of prostate: Secondary | ICD-10-CM | POA: Diagnosis not present

## 2018-01-27 DIAGNOSIS — N319 Neuromuscular dysfunction of bladder, unspecified: Secondary | ICD-10-CM | POA: Diagnosis not present

## 2018-01-27 HISTORY — DX: Dyspnea, unspecified: R06.00

## 2018-01-27 LAB — BASIC METABOLIC PANEL
Anion gap: 7 (ref 5–15)
BUN: 16 mg/dL (ref 8–23)
CO2: 27 mmol/L (ref 22–32)
Calcium: 9 mg/dL (ref 8.9–10.3)
Chloride: 107 mmol/L (ref 98–111)
Creatinine, Ser: 1.08 mg/dL (ref 0.61–1.24)
GFR calc Af Amer: >60 mL/min (ref >60)
GFR calc non Af Amer: >60 mL/min (ref >60)
Glucose, Bld: 100 mg/dL — ABNORMAL HIGH (ref 70–99)
Potassium: 3.5 mmol/L (ref 3.5–5.1)
Sodium: 141 mmol/L (ref 135–145)

## 2018-01-27 LAB — CBC
HCT: 34.3 % — ABNORMAL LOW (ref 39.0–52.0)
Hemoglobin: 11.3 g/dL — ABNORMAL LOW (ref 13.0–17.0)
MCH: 30.9 pg (ref 26.0–34.0)
MCHC: 32.9 g/dL (ref 30.0–36.0)
MCV: 93.7 fL (ref 80.0–100.0)
Platelets: 168 10*3/uL (ref 150–400)
RBC: 3.66 MIL/uL — ABNORMAL LOW (ref 4.22–5.81)
RDW: 13.2 % (ref 11.5–15.5)
WBC: 7.3 10*3/uL (ref 4.0–10.5)
nRBC: 0 % (ref 0.0–0.2)

## 2018-01-27 LAB — PROTIME-INR
INR: 0.91
Prothrombin Time: 12.1 seconds (ref 11.4–15.2)

## 2018-01-27 LAB — GLUCOSE, CAPILLARY: Glucose-Capillary: 96 mg/dL (ref 70–99)

## 2018-01-27 LAB — APTT: aPTT: 30 seconds (ref 24–36)

## 2018-01-27 MED ORDER — SODIUM CHLORIDE 0.9 % IV SOLN
INTRAVENOUS | Status: DC
  Administered 2018-01-27 (×2): via INTRAVENOUS

## 2018-01-27 MED ORDER — FENTANYL CITRATE (PF) 100 MCG/2ML IJ SOLN
INTRAMUSCULAR | Status: AC | PRN
  Administered 2018-01-27 (×3): 25 ug via INTRAVENOUS

## 2018-01-27 MED ORDER — CEFAZOLIN SODIUM-DEXTROSE 2-4 GM/100ML-% IV SOLN
INTRAVENOUS | Status: AC
  Administered 2018-01-27: 2 g via INTRAVENOUS
  Filled 2018-01-27: qty 100

## 2018-01-27 MED ORDER — MIDAZOLAM HCL 2 MG/2ML IJ SOLN
INTRAMUSCULAR | Status: AC | PRN
  Administered 2018-01-27 (×3): 0.5 mg via INTRAVENOUS

## 2018-01-27 MED ORDER — FENTANYL CITRATE (PF) 100 MCG/2ML IJ SOLN
INTRAMUSCULAR | Status: AC
  Filled 2018-01-27: qty 4

## 2018-01-27 MED ORDER — NALOXONE HCL 0.4 MG/ML IJ SOLN
INTRAMUSCULAR | Status: AC
  Filled 2018-01-27: qty 1

## 2018-01-27 MED ORDER — MIDAZOLAM HCL 2 MG/2ML IJ SOLN
INTRAMUSCULAR | Status: AC
  Filled 2018-01-27: qty 4

## 2018-01-27 MED ORDER — CEFAZOLIN SODIUM-DEXTROSE 2-4 GM/100ML-% IV SOLN
2.0000 g | Freq: Once | INTRAVENOUS | Status: AC
  Administered 2018-01-27: 2 g via INTRAVENOUS

## 2018-01-27 MED ORDER — SODIUM CHLORIDE 0.9 % IV SOLN
INTRAVENOUS | Status: AC
  Filled 2018-01-27: qty 1000

## 2018-01-27 MED ORDER — FLUMAZENIL 0.5 MG/5ML IV SOLN
INTRAVENOUS | Status: AC
  Filled 2018-01-27: qty 5

## 2018-01-27 NOTE — H&P (Signed)
Chief Complaint: Patient was seen in consultation today for suprapubic catheter placement.  Referring Physician(s): Irine Seal  Supervising Physician: Jacqulynn Cadet  Patient Status: [facility] - Out-pt  History of Present Illness: Jonathan [name] is a 77 y.o. male with a past medical history significant for COPD, DM with neuropathy, HLD, GERD, abdominal aortic aneurysm, neurogenic bladder, history of lung cancer, history of prostate cancer and urinary retention. He presents today for placement of suprapubic cathter due to urinary retention at the request of Dr. Jeffie Pollock. He states he has been chronically using a foley catheter since at least September and he is unable to expel any urine aside from a small amount of dribbling. He states he has a lot of scar tissue "all over down there" and is currently undergoing radiation for prostate cancer so that he can eventually have surgery.   Past Medical History:  Diagnosis Date  . Acquired bladder diverticulum    12/ 2012  resection diverticulum done at [facility] in [place], [place]  . Age-related cataract of right eye    scheduled for catarat extraction 09-12-2017  . Agent orange exposure   . Aneurysm of infrarenal abdominal aorta ([place])    first dx 2017--- last CT 06-11-2017  measures 3.5cm  . Chronic constipation   . COPD with emphysema ([place])    06--12-2017  per pt no symptoms since Feb 2019  . Depression   . Diabetes mellitus type 2, diet-controlled ([place])   . Diabetic neuropathy ([place])   . Dyslipidemia   . Dyspnea    increased exertion; fumes  . Dyspnea on exertion   . Erectile dysfunction   . Feeling of incomplete bladder emptying   . GERD (gastroesophageal reflux disease)   . Gout    09-02-2017 last flare up 3 months ago  . Hiatal hernia   . History of atrial fibrillation    episode post op right lung lobectomy 07-04-2015  . History of bladder stone   . History of colon polyps   . History of DVT of lower extremity    03/ 2019   bilateral lower extremity (superficial) ---  per treated w/ oral medication   . History of gastritis   . History of kidney stones   . History of radiation therapy 09-14-2015  to 09-21-2015   left upper lung nodule -- 54Gy in 3 fractions (18 Gy per fraction)  . Hyperplasia of prostate with lower urinary tract symptoms (LUTS)   . Hypertension   . Lumbar spinal stenosis   . Nephrolithiasis   . Neurogenic bladder   . Osteoarthritis    ankle, hands  . Osteoporosis   . Prostate cancer [facility]) urologist-  dr wrenn/  oncologist-  dr Tammi Klippel   dx 05-24-2017-- Stage T2b,  Gleason 4+4,  PSA 22.41,  vol 38cc--- started ADT 04/ 2019,  plan external radiation therapy  . Radiation fibrosis of lung ([place])    hx left upper long nodule SBRT 06/ 2017  . Squamous cell carcinoma of both lungs ([place]) last CT in epic dated 06-26-2017 no recurrence   dx 03/ 2017  non-small cell SCC via bronchoscopy w/ bx's by dr Roxan Hockey---  s/p  right VATS w/ right lobectomy and node dissection's 07-04-2015 /  pt had SBRT to left upper nodule Stage 1 ([facility]) completed 09-21-2015  . Wears dentures    bottom  . Wears glasses     Past Surgical History:  Procedure Laterality Date  . CARPAL TUNNEL RELEASE Right 07/09/2014   Procedure:  RIGHT OPEN CARPAL TUNNEL RELEASE;  Surgeon: Mcarthur Rossetti, MD;  Location: [facility];  Service: Orthopedics;  Laterality: Right;  . CHOLECYSTECTOMY N/A 02/24/2014   Procedure: LAPAROSCOPIC CHOLECYSTECTOMY;  Surgeon: Coralie Keens, MD;  Location: [facility];  Service: General;  Laterality: N/A;  . COLONOSCOPY    . [facility]ST[facility]ITHOT[facility]  [facility] [place]  . CYSTOSCOPY W/ LITHOLAPAXY / EHL  02-24-2007   dr Janice Norrie  [facility]  . GOLD SEED IMPLANT N/A 09/05/2017   Procedure: GOLD SEED IMPLANT;  Surgeon: Irine Seal, MD;  Location: [facility];  Service: Urology;  Laterality: N/A;  . SPACE OAR INSTILLATION N/A 09/05/2017   Procedure: SPACE OAR INSTILLATION;   Surgeon: Irine Seal, MD;  Location: [facility];  Service: Urology;  Laterality: N/A;  . TOTAL ANKLE ARTHROPLASTY Right 04/20/2015   Procedure: TOTAL ANKLE ARTHOPLASTY;  Surgeon: Newt Minion, MD;  Location: [facility];  Service: Orthopedics;  Laterality: Right;  . TRANSTHORACIC ECHOCARDIOGRAM  11/16/2012   ef 55-60%,  grade 1 diastolic dysfunction/  mild dilated ascending aorta/  mild MR/ trivial TR  . VIDEO ASSISTED THORACOSCOPY (VATS)/ LOBECTOMY Right 07/04/2015   Procedure: VIDEO ASSISTED THORACOSCOPY (VATS)/ RIGHT UPPER LOBECTOMY;  Surgeon: Melrose Nakayama, MD;  Location: [facility];  Service: Thoracic;  Laterality: Right;  [name] VIDEO BRONCHOSCOPY N/A 06/17/2015   Procedure: VIDEO BRONCHOSCOPY;  Surgeon: Melrose Nakayama, MD;  Location: [facility];  Service: Thoracic;  Laterality: N/A;  . VIDEO BRONCHOSCOPY WITH ENDOBRONCHIAL NAVIGATION N/A 06/17/2015   Procedure: VIDEO BRONCHOSCOPY WITH ENDOBRONCHIAL NAVIGATION;  Surgeon: Melrose Nakayama, MD;  Location: [facility];  Service: Thoracic;  Laterality: N/A;  . VIDEO BRONCHOSCOPY WITH ENDOBRONCHIAL ULTRASOUND N/A 06/17/2015   Procedure: VIDEO BRONCHOSCOPY WITH ENDOBRONCHIAL ULTRASOUND;  Surgeon: Melrose Nakayama, MD;  Location: [facility];  Service: Thoracic;  Laterality: N/A;    Allergies: Lisinopril; Ciprofloxacin; and Levaquin [levofloxacin]  Medications: Prior to Admission medications   Medication Sig Start Date End Date Taking? Authorizing Provider  atenolol (TENORMIN) 25 MG tablet Take 1 tablet (25 mg total) by mouth at bedtime. Patient taking differently: Take 25 mg by mouth daily after breakfast.  07/14/15  Yes Tacy Dura, Donielle M, PA-C  belladonna-opium (B&O SUPPRETTES) 16.2-30 MG suppository Place 1 suppository (30 mg total) rectally 2 (two) times daily as needed for pain (rectal spasms). 12/21/17  Yes Duffy Bruce, MD  ketorolac (ACULAR) 0.4 % SOLN Place 1 drop into the left eye 4 (four) times daily. 11/28/17  Yes [provider]  LANTUS SOLOSTAR 100 UNIT/ML Solostar Pen Inject 5 Units into the skin daily after breakfast.  11/28/17  Yes [provider]  LINZESS 72 MCG capsule Take 72 mcg by mouth daily as needed (constipation).  10/03/17  Yes [provider]  Meth-Hyo-M Bl-Na Phos-Ph Sal (URIBEL) 118 MG CAPS Take 1 tablet by mouth 4 (four) times daily as needed (burning).  09/21/11  Yes [provider]  mirabegron ER (MYRBETRIQ) 25 MG TB24 tablet Take 25 mg by mouth at bedtime.   Yes [provider]  ofloxacin (OCUFLOX) 0.3 % ophthalmic solution Place 1 drop into the left eye 4 (four) times daily. 11/28/17  Yes [provider]  Oxycodone HCl 10 MG TABS Take 10 mg by mouth 4 (four) times daily as needed (pain).  07/18/15  Yes [provider]  prednisoLONE acetate (PRED FORTE) 1 % ophthalmic suspension Place 1 drop into both eyes 2 (two) times  daily. 11/28/17  Yes [provider]  predniSONE (DELTASONE) 20 MG tablet Take 20 mg by mouth daily as needed (for gout symptoms).   Yes [provider]  sulfamethoxazole-trimethoprim (BACTRIM DS,SEPTRA DS) 800-160 MG tablet Take 1 tablet by mouth 2 (two) times daily.   Yes [provider]  tamsulosin (FLOMAX) 0.4 MG CAPS capsule Take 0.8 mg by mouth daily after supper. Reported on 10/12/2015   Yes [provider]  timolol (TIMOPTIC) 0.5 % ophthalmic solution Place 1 drop into both eyes daily. 01/09/18  Yes [provider]  cephALEXin (KEFLEX) 500 MG capsule Take 1 capsule (500 mg total) by mouth 4 (four) times daily. Patient not taking: Reported on 01/17/2018 12/11/17   Molpus, John, MD  sildenafil (VIAGRA) 100 MG tablet Take 100 mg by mouth daily as needed for erectile dysfunction.     [provider]  SIMBRINZA 1-0.2 % SUSP Place 1 drop into both eyes 4 (four) times daily. 09/24/17   [provider]     Family History  Problem Relation Age of Onset  . Cancer Father         Asbestos  . Colon cancer Neg Hx   . Colon polyps Neg Hx   . Kidney disease Neg Hx   . Esophageal cancer Neg Hx   . Gallbladder disease Neg Hx   . Heart disease Neg Hx   . Diabetes Neg Hx     Social History   Socioeconomic History  . Marital status: Widowed    Spouse name: Not on file  . Number of children: 2  . Years of education: college  . Highest education level: Not on file  Occupational History  . Occupation: retired  Scientific laboratory technician  . Financial resource strain: Not on file  . Food insecurity:    Worry: Not on file    Inability: Not on file  . Transportation needs:    Medical: Not on file    Non-medical: Not on file  Tobacco Use  . Smoking status: Former Smoker    Packs/day: 0.50    Years: 40.00    Pack years: 20.00    Types: Cigarettes    Last attempt to quit: 02/13/2015    Years since quitting: 2.9  . Smokeless tobacco: Never Used  Substance and Sexual Activity  . Alcohol use: No    Alcohol/week: 0.0 standard drinks  . Drug use: No  . Sexual activity: Not on file  Lifestyle  . Physical activity:    Days per week: Not on file    Minutes per session: Not on file  . Stress: Not on file  Relationships  . Social connections:    Talks on phone: Not on file    Gets together: Not on file    Attends religious service: Not on file    Active member of club or organization: Not on file    Attends meetings of clubs or organizations: Not on file    Relationship status: Not on file  Other Topics Concern  . Not on file  Social History Narrative  . Not on file     Review of Systems: A 12 point ROS discussed and pertinent positives are indicated in the HPI above.  All other systems are negative.  Review of Systems  Constitutional: Negative for chills and fever.  Respiratory: Negative for cough and shortness of breath.   Cardiovascular: Negative for chest pain.  Gastrointestinal: Positive for abdominal pain (occasional). Negative for nausea and vomiting.  Genitourinary: Positive for difficulty urinating.  Skin: Negative for rash.  Neurological: Negative for dizziness and syncope.  Psychiatric/Behavioral: Negative for confusion.    Vital Signs: BP (!) 128/95   Pulse 73   Temp 98.1 F (36.7 C) (Oral)   Resp 18   SpO2 97%   Physical Exam  Constitutional: He is oriented to person, place, and time. No distress.  HENT:  Head: Normocephalic.  Cardiovascular: Normal rate, regular rhythm and normal heart sounds.  Pulmonary/Chest: Effort normal and breath sounds normal.  Abdominal: Soft. He exhibits no distension.  Foley catheter present  Neurological: He is alert and oriented to person, place, and time.  Skin: Skin is warm and dry. He is not diaphoretic.  Psychiatric: He has a normal mood and affect. His behavior is normal. Judgment and thought content normal.  Vitals reviewed.    MD Evaluation Airway: WNL Heart: WNL Abdomen: WNL Chest/ Lungs: WNL ASA  Classification: 3 Mallampati/Airway Score: One   Imaging: No results found.  Labs:  CBC: Recent Labs    09/05/17 1129 12/10/17 2127 12/21/17 1353 01/27/18 0752  WBC  --  5.3 4.7 7.3  HGB 11.9* 12.1* 11.7* 11.3*  HCT 35.0* 35.8* 35.0* 34.3*  PLT  --  200 205 168    COAGS: Recent Labs    01/27/18 0752  INR 0.91  APTT 30    BMP: Recent Labs    06/11/17 1204 09/05/17 1129 12/10/17 2127 12/21/17 1353 01/27/18 0752  NA  --  141 140 143 141  K  --  3.6 3.9 4.2 3.5  CL  --   --  104 107 107  CO2  --   --  26 28 27   GLUCOSE  --  93 116* 107* 100*  BUN  --   --  13 13 16   CALCIUM  --   --  9.3 9.4 9.0  CREATININE 1.30*  --  1.30* 1.39* 1.08  GFRNONAA  --   --  52* 48* >60  GFRAA  --   --  >60 56* >60    LIVER FUNCTION TESTS: Recent Labs    12/10/17 2127 12/21/17 1353  BILITOT 0.6 0.5  AST 21 18  ALT 20 14  ALKPHOS 46 46  PROT 7.7 6.9  ALBUMIN 3.8 3.4*    TUMOR MARKERS: No results for input(s): AFPTM, CEA, CA199, CHROMGRNA in the last 8760  hours.  Assessment and Plan:  Patient with history of prostate cancer currently undergoing radiation and followed by Dr. Jeffie Pollock. He has been using a foley catheter for several months due to urinary retention and request has been made for suprapubic catheter placement.   He has been NPO since 8 pm last night, small sip of water with HTN medications this morning, he does not take any blood thinning medications, WBC 7.3, H/H 11.3/34.3, INR 0.91. He states understanding of procedure and wishes to proceed with placement.  Risks and benefits of suprapubic catheter placement were discussed with the patient including, but not limited to, infection, bleeding and damage to adjacent structures.   All of the patient's questions were answered, patient is agreeable to proceed.  Consent signed and in chart.  Thank you for this interesting consult.  I greatly enjoyed meeting Jonathan [name] and look forward to participating in their care.  A copy of this report was sent to the requesting provider on this date.  Electronically Signed: Joaquim Nam, PA-C 01/27/2018, 8:36 AM   I spent a total of 30  Minutes   in face to face in clinical consultation, greater than 50% of which was counseling/coordinating care for suprapubic catheter placement.

## 2018-01-27 NOTE — Procedures (Signed)
Interventional Radiology Procedure Note  Procedure: CT guided placement of a 79F suprapubic catheter. 2None  Complications: None  Estimated Blood Loss: None  Recommendations: - Drain to gravity - Return to IR in 4-6 weeks for tube exchange and upsize.    Signed,  Criselda Peaches, MD

## 2018-01-27 NOTE — Discharge Instructions (Signed)
Suprapubic Catheter Home Guide A suprapubic catheter is a rubber tube used to drain urine from the bladder into a collection bag. The catheter is inserted into the bladder through a small opening in the in the lower abdomen, near the center of the body, above the pubic bone (suprapubic area). There is a tiny balloon filled with germ-free (sterile) water on the end of the catheter that is in the bladder. The balloon helps to keep the catheter in place. Your suprapubic catheter may need to be replaced every 4-6 weeks, or as often as recommended by your health care provider. The collection bag must be emptied every day and cleaned every 2-3 days. The collection bag can be put beside your bed at night and attached to your leg during the day. You may have a large collection bag to use at night and a smaller one to use during the day. What are the risks?  Urine flow can become blocked. This can happen if the catheter is not working correctly, or if you have a blood clot in your bladder or in the catheter.  Tissue near the catheter may can become irritated and bleed.  Bacteria may get into your bladder and cause a urinary tract infection. How do I change the catheter? Supplies needed  Two pairs of sterile gloves.  Catheter.  Two syringes.  Sterile water.  Sterile cleaning solution.  Lubricant.  Collection bags. Changing the catheter To replace your catheter, take the following steps: 1. Drink plenty of fluids during the hours before you plan to change the catheter. 2. Wash your hands with soap and water. If soap and water are not available, use hand sanitizer. 3. Lie on your back and put on sterile gloves. 4. Clean the skin around the catheter opening using the sterile cleaning solution. 5. Remove the water from the balloon using a syringe. 6. Slowly remove the catheter. ? Do not pull on the catheter if it seems stuck. ? Call your health care provider immediately if you have difficulty  removing the catheter. 7. Take off the used gloves, and put on a new pair. 8. Put lubricant on the end of the new catheter that will go into your bladder. 9. Gently slide the catheter through the opening in your abdomen and into your bladder. 10. Wait for some urine to start flowing through the catheter. When urine starts to flow through the catheter, use a new syringe to fill the balloon with sterile water. 11. Attach the collection bag to the end of the catheter. Make sure the connection is tight. 12. Remove the gloves and wash your hands with soap and water.  How do I care for my skin around the catheter? Use a clean washcloth and soapy water to clean the skin around your catheter every day. Pat the area dry with a clean towel.  Do not pull on the catheter.  Do not use ointment or lotion on this area unless told by your health care provider.  Check your skin around the catheter every day for signs of infection. Check for: ? Redness, swelling, or pain. ? Fluid or blood. ? Warmth. ? Pus or a bad smell.  How do I clean and empty the collection bag? Clean the collection bag every 2-3 days, or as often as told by your health care provider. To do this, take the following steps:  Wash your hands with soap and water. If soap and water are not available, use hand sanitizer.  Disconnect the bag  from the catheter and immediately attach a new bag to the catheter.  Empty the used bag completely.  Clean the used bag using one of the following methods: ? Rinse the used bag with warm water and soap. ? Fill the bag with water and add 1 tsp of vinegar. Let it sit for about 30 minutes, then empty the bag.  Let the bag dry completely, and put it in a clean plastic bag before storing it.  Empty the large collection bag every 8 hours. Empty the small collection bag when it is about ? full. To empty your large or small collection bag, take the following steps:  Always keep the bag below the level  of the catheter. This keeps urine from flowing backwards into the catheter.  Hold the bag over the toilet or another container. Turn the valve (spigot) at the bottom of the bag to empty the urine. ? Do not touch the opening of the spigot. ? Do not let the opening touch the toilet or container.  Close the spigot tightly when the bag is empty.  What are some general tips?  Always wash your hands before and after caring for your catheter and collection bag. Use a mild, fragrance-free soap. If soap and water are not available, use hand sanitizer.  Clean the catheter with soap and water as often as told by your health care provider.  Always make sure there are no twists or curls (kinks) in the catheter tube.  Always make sure there are no leaks in the catheter or collection bag.  Drink enough fluid to keep your urine clear or pale yellow.  Do not take baths, swim, or use a hot tub. When should I seek medical care? Seek medical care if:  You leak urine.  You have redness, swelling, or pain around your catheter opening.  You have fluid or blood coming from your catheter opening.  Your catheter opening feels warm to the touch.  You have pus or a bad smell coming from your catheter opening.  You have a fever or chills.  Your urine flow slows down.  Your urine becomes cloudy or smelly.  When should I seek immediate medical care? Seek immediate medical care if your catheter comes out, or if you have:  Nausea.  Back pain.  Difficulty changing your catheter.  Blood in your urine.  No urine flow for 1 hour.  This information is not intended to replace advice given to you by your health care provider. Make sure you discuss any questions you have with your health care provider. Document Released: 11/28/2010 Document Revised: 11/09/2015 Document Reviewed: 11/23/2014 Elsevier Interactive Patient Education  2018 [place]. Moderate Conscious Sedation, Adult, Care After These  instructions provide you with information about caring for yourself after your procedure. Your health care provider may also give you more specific instructions. Your treatment has been planned according to current medical practices, but problems sometimes occur. Call your health care provider if you have any problems or questions after your procedure. What can I expect after the procedure? After your procedure, it is common:  To feel sleepy for several hours.  To feel clumsy and have poor balance for several hours.  To have poor judgment for several hours.  To vomit if you eat too soon.  Follow these instructions at home: For at least 24 hours after the procedure:   Do not: ? Participate in activities where you could fall or become injured. ? Drive. ? Use heavy  machinery. ? Drink alcohol. ? Take sleeping pills or medicines that cause drowsiness. ? Make important decisions or sign legal documents. ? Take care of children on your own.  Rest. Eating and drinking  Follow the diet recommended by your health care provider.  If you vomit: ? Drink water, juice, or soup when you can drink without vomiting. ? Make sure you have little or no nausea before eating solid foods. General instructions  Have a responsible adult stay with you until you are awake and alert.  Take over-the-counter and prescription medicines only as told by your health care provider.  If you smoke, do not smoke without supervision.  Keep all follow-up visits as told by your health care provider. This is important. Contact a health care provider if:  You keep feeling nauseous or you keep vomiting.  You feel light-headed.  You develop a rash.  You have a fever. Get help right away if:  You have trouble breathing. This information is not intended to replace advice given to you by your health care provider. Make sure you discuss any questions you have with your health care provider. Document Released:  12/31/2012 Document Revised: 08/15/2015 Document Reviewed: 07/02/2015 Elsevier Interactive Patient Education  [employer].

## 2018-01-30 ENCOUNTER — Other Ambulatory Visit: Payer: Self-pay

## 2018-01-30 ENCOUNTER — Encounter (HOSPITAL_COMMUNITY): Payer: Self-pay | Admitting: Emergency Medicine

## 2018-01-30 ENCOUNTER — Emergency Department (HOSPITAL_COMMUNITY)
Admission: EM | Admit: 2018-01-30 | Discharge: 2018-01-30 | Disposition: A | Discharge status: Home / Self Care | Payer: Medicare Other | Attending: Emergency Medicine | Admitting: Emergency Medicine

## 2018-01-30 DIAGNOSIS — E119 Type 2 diabetes mellitus without complications: Secondary | ICD-10-CM | POA: Diagnosis not present

## 2018-01-30 DIAGNOSIS — Z79899 Other long term (current) drug therapy: Secondary | ICD-10-CM | POA: Insufficient documentation

## 2018-01-30 DIAGNOSIS — Z9359 Other cystostomy status: Secondary | ICD-10-CM | POA: Diagnosis not present

## 2018-01-30 DIAGNOSIS — Z87891 Personal history of nicotine dependence: Secondary | ICD-10-CM | POA: Insufficient documentation

## 2018-01-30 DIAGNOSIS — Z8546 Personal history of malignant neoplasm of prostate: Secondary | ICD-10-CM | POA: Insufficient documentation

## 2018-01-30 DIAGNOSIS — I10 Essential (primary) hypertension: Secondary | ICD-10-CM | POA: Insufficient documentation

## 2018-01-30 DIAGNOSIS — J449 Chronic obstructive pulmonary disease, unspecified: Secondary | ICD-10-CM | POA: Insufficient documentation

## 2018-01-30 DIAGNOSIS — R8271 Bacteriuria: Secondary | ICD-10-CM | POA: Insufficient documentation

## 2018-01-30 LAB — URINALYSIS, ROUTINE W REFLEX MICROSCOPIC
Bilirubin Urine: NEGATIVE
Glucose, UA: NEGATIVE mg/dL
Ketones, ur: NEGATIVE mg/dL
Nitrite: NEGATIVE
Protein, ur: >=300 mg/dL — AB
Specific Gravity, Urine: 1.022 (ref 1.005–1.030)
pH: 6 (ref 5.0–8.0)

## 2018-01-30 MED ORDER — SULFAMETHOXAZOLE-TRIMETHOPRIM 800-160 MG PO TABS: 1.0000 | ORAL_TABLET | Freq: Two times a day (BID) | ORAL | 0 refills | Status: AC

## 2018-01-30 MED ORDER — SULFAMETHOXAZOLE-TRIMETHOPRIM 800-160 MG PO TABS: 1.0000 | ORAL_TABLET | Freq: Two times a day (BID) | ORAL | 0 refills | Status: DC

## 2018-01-30 NOTE — ED Triage Notes (Signed)
Patient c/o worsening rectal pain and pain in penis since completing radiation on prostate. Hx prostate cancer. Reports suprapubic cath placed on Monday.

## 2018-01-30 NOTE — Discharge Instructions (Addendum)
A culture has been sent to further check for infection in your urine. Do not take the antibiotics until you get your culture report (call the hospital in 3 days). Start antibiotics if you develop symptoms of infection.  Follow up with your doctor, return to ER for worsening or concerning symptoms.

## 2018-01-30 NOTE — ED Provider Notes (Signed)
[facility] DEPT Provider Note   CSN: [id number] Arrival date & time: 01/30/18  1953     History   Chief Complaint Chief Complaint  Patient presents with  . Rectal Pain    HPI Jonathan [name] is a 77 y.o. male.  77 year old male with history of prostate cancer and recent suprapubic catheter placement presents with complaint for possible urinary tract infection.  Patient states that he had a suprapubic catheter placed on Monday and is concerned that he is not sent home on antibiotics.     Past Medical History:  Diagnosis Date  . Acquired bladder diverticulum    12/ 2012  resection diverticulum done at [facility] in [place], [place]  . Age-related cataract of right eye    scheduled for catarat extraction 09-12-2017  . Agent orange exposure   . Aneurysm of infrarenal abdominal aorta ([place])    first dx 2017--- last CT 06-11-2017  measures 3.5cm  . Chronic constipation   . COPD with emphysema ([place])    06--12-2017  per pt no symptoms since Feb 2019  . Depression   . Diabetes mellitus type 2, diet-controlled ([place])   . Diabetic neuropathy ([place])   . Dyslipidemia   . Dyspnea    increased exertion; fumes  . Dyspnea on exertion   . Erectile dysfunction   . Feeling of incomplete bladder emptying   . GERD (gastroesophageal reflux disease)   . Gout    09-02-2017 last flare up 3 months ago  . Hiatal hernia   . History of atrial fibrillation    episode post op right lung lobectomy 07-04-2015  . History of bladder stone   . History of colon polyps   . History of DVT of lower extremity    03/ 2019  bilateral lower extremity (superficial) ---  per treated w/ oral medication   . History of gastritis   . History of kidney stones   . History of radiation therapy 09-14-2015  to 09-21-2015   left upper lung nodule -- 54Gy in 3 fractions (18 Gy per fraction)  . Hyperplasia of prostate with lower urinary tract symptoms (LUTS)   . Hypertension   . Lumbar spinal  stenosis   . Nephrolithiasis   . Neurogenic bladder   . Osteoarthritis    ankle, hands  . Osteoporosis   . Prostate cancer [facility]) urologist-  dr wrenn/  oncologist-  dr Tammi Klippel   dx 05-24-2017-- Stage T2b,  Gleason 4+4,  PSA 22.41,  vol 38cc--- started ADT 04/ 2019,  plan external radiation therapy  . Radiation fibrosis of lung ([place])    hx left upper long nodule SBRT 06/ 2017  . Squamous cell carcinoma of both lungs ([place]) last CT in epic dated 06-26-2017 no recurrence   dx 03/ 2017  non-small cell SCC via bronchoscopy w/ bx's by dr Roxan Hockey---  s/p  right VATS w/ right lobectomy and node dissection's 07-04-2015 /  pt had SBRT to left upper nodule Stage 1 ([facility]) completed 09-21-2015  . Wears dentures    bottom  . Wears glasses     Patient Active Problem List   Diagnosis Date Noted  . Malignant neoplasm of prostate ([place]) 07/10/2017  . Arthritis of right subtalar joint 04/18/2017  . 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 Idiopathic chronic venous hypertension of both lower extremities with inflammation 05/22/2016  . Fibrosis of subtalar joint, right 03/08/2016  . Primary malignant neoplasm of left upper lobe of  lung ([employer]) 09/21/2015  . Lung cancer ([place]) 07/04/2015  . Diabetes mellitus type II, controlled ([place]) 06/01/2015  . 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 Emphysema of lung ([place])   . Kidney stones   . Osteoporosis   . H/O total ankle replacement, right 04/20/2015  . Carpal tunnel syndrome - right 07/09/2014  . Abdominal bloating 06/14/2014  . History of colonic polyps 06/14/2014  . Diabetic polyneuropathy associated with type 2 diabetes mellitus ([place]) 12/05/2012  . Lesion of ulnar nerve 12/05/2012  . Syncope 11/15/2012  . Gout 11/15/2012  . Constipation 04/02/2009  . CYSTITIS, RECURRENT 12/02/2008  . COPD 10/26/2008  .  DIVERTICULUM, BLADDER 10/11/2008  . INSOMNIA 07/24/2008  . ACUTE CYSTITIS 07/20/2008  . FATIGUE 07/20/2008  . DEPRESSION 02/27/2008  . CHEST PAIN UNSPECIFIED 02/27/2008  . NICOTINE ADDICTION 08/12/2007  . DYSLIPIDEMIA 07/22/2007  . OBESITY 07/22/2007  . ERECTILE DYSFUNCTION 07/22/2007  . HYPERTENSION 07/22/2007    Past Surgical History:  Procedure Laterality Date  . CARPAL TUNNEL RELEASE Right 07/09/2014   Procedure: RIGHT OPEN CARPAL TUNNEL RELEASE;  Surgeon: Mcarthur Rossetti, MD;  Location: [facility];  Service: Orthopedics;  Laterality: Right;  . CHOLECYSTECTOMY N/A 02/24/2014   Procedure: LAPAROSCOPIC CHOLECYSTECTOMY;  Surgeon: Coralie Keens, MD;  Location: [facility];  Service: General;  Laterality: N/A;  . COLONOSCOPY    . [facility]ST[facility]ITHOT[facility]  [facility] [place]  . CYSTOSCOPY W/ LITHOLAPAXY / EHL  02-24-2007   dr Janice Norrie  [facility]  . GOLD SEED IMPLANT N/A 09/05/2017   Procedure: GOLD SEED IMPLANT;  Surgeon: Irine Seal, MD;  Location: [facility];  Service: Urology;  Laterality: N/A;  . SPACE OAR INSTILLATION N/A 09/05/2017   Procedure: SPACE OAR INSTILLATION;  Surgeon: Irine Seal, MD;  Location: [facility];  Service: Urology;  Laterality: N/A;  . TOTAL ANKLE ARTHROPLASTY Right 04/20/2015   Procedure: TOTAL ANKLE ARTHOPLASTY;  Surgeon: Newt Minion, MD;  Location: [facility];  Service: Orthopedics;  Laterality: Right;  . TRANSTHORACIC ECHOCARDIOGRAM  11/16/2012   ef 55-60%,  grade 1 diastolic dysfunction/  mild dilated ascending aorta/  mild MR/ trivial TR  . VIDEO ASSISTED THORACOSCOPY (VATS)/ LOBECTOMY Right 07/04/2015   Procedure: VIDEO ASSISTED THORACOSCOPY (VATS)/ RIGHT UPPER LOBECTOMY;  Surgeon: Melrose Nakayama, MD;  Location: [facility];  Service: Thoracic;  Laterality: Right;  [name] VIDEO BRONCHOSCOPY N/A 06/17/2015   Procedure: VIDEO BRONCHOSCOPY;  Surgeon: Melrose Nakayama, MD;  Location: [facility];  Service: Thoracic;  Laterality:  N/A;  . VIDEO BRONCHOSCOPY WITH ENDOBRONCHIAL NAVIGATION N/A 06/17/2015   Procedure: VIDEO BRONCHOSCOPY WITH ENDOBRONCHIAL NAVIGATION;  Surgeon: Melrose Nakayama, MD;  Location: [facility];  Service: Thoracic;  Laterality: N/A;  . VIDEO BRONCHOSCOPY WITH ENDOBRONCHIAL ULTRASOUND N/A 06/17/2015   Procedure: VIDEO BRONCHOSCOPY WITH ENDOBRONCHIAL ULTRASOUND;  Surgeon: Melrose Nakayama, MD;  Location: [facility];  Service: Thoracic;  Laterality: N/A;        Home Medications    Prior to Admission medications   Medication Sig Start Date End Date Taking? Authorizing Provider  atenolol (TENORMIN) 25 MG tablet Take 1 tablet (25 mg total) by mouth at bedtime. Patient taking differently: Take 25 mg by mouth daily after breakfast.  07/14/15  Yes Tacy Dura, Donielle M, PA-C  belladonna-opium (B&O SUPPRETTES) 16.2-30 MG suppository Place 1 suppository (30 mg total) rectally  2 (two) times daily as needed for pain (rectal spasms). 12/21/17  Yes Duffy Bruce, MD  ketorolac (ACULAR) 0.4 % SOLN Place 1 drop into the left eye 4 (four) times daily. 11/28/17  Yes [provider]  LINZESS 72 MCG capsule Take 72 mcg by mouth daily as needed (constipation).  10/03/17  Yes [provider]  mirabegron ER (MYRBETRIQ) 50 MG TB24 tablet Take 50 mg by mouth daily.   Yes [provider]  ofloxacin (OCUFLOX) 0.3 % ophthalmic solution Place 1 drop into the left eye daily.  11/28/17  Yes [provider]  Oxycodone HCl 10 MG TABS Take 10 mg by mouth 4 (four) times daily as needed (pain).  07/18/15  Yes [provider]  prednisoLONE acetate (PRED FORTE) 1 % ophthalmic suspension Place 1 drop into both eyes daily.  11/28/17  Yes [provider]  SIMBRINZA 1-0.2 % SUSP Place 1 drop into both eyes daily.  09/24/17  Yes [provider]  tamsulosin (FLOMAX) 0.4 MG CAPS capsule Take 0.8 mg by mouth daily after supper. Reported on 10/12/2015   Yes [provider]  timolol  (TIMOPTIC) 0.5 % ophthalmic solution Place 1 drop into both eyes daily. 01/09/18  Yes [provider]  LANTUS SOLOSTAR 100 UNIT/ML Solostar Pen Inject 5 Units into the skin daily as needed (blood sugar < 114).  11/28/17   [provider]  predniSONE (DELTASONE) 20 MG tablet Take 20 mg by mouth daily as needed (for gout symptoms).    [provider]  sildenafil (VIAGRA) 100 MG tablet Take 100 mg by mouth daily as needed for erectile dysfunction.     [provider]  sulfamethoxazole-trimethoprim (BACTRIM DS,SEPTRA DS) 800-160 MG tablet Take 1 tablet by mouth 2 (two) times daily for 5 days. 01/30/18 02/04/18  Tacy Learn, PA-C    Family History Family History  Problem Relation Age of Onset  . Cancer Father        Asbestos  . Colon cancer Neg Hx   . Colon polyps Neg Hx   . Kidney disease Neg Hx   . Esophageal cancer Neg Hx   . Gallbladder disease Neg Hx   . Heart disease Neg Hx   . Diabetes Neg Hx     Social History Social History   Tobacco Use  . Smoking status: Former Smoker    Packs/day: 0.50    Years: 40.00    Pack years: 20.00    Types: Cigarettes    Last attempt to quit: 02/13/2015    Years since quitting: 2.9  . Smokeless tobacco: Never Used  Substance Use Topics  . Alcohol use: No    Alcohol/week: 0.0 standard drinks  . Drug use: No     Allergies   Lisinopril; Ciprofloxacin; and Levaquin [levofloxacin]   Review of Systems Review of Systems  Constitutional: Negative for chills and fever.  Gastrointestinal: Negative for abdominal pain, constipation, diarrhea, nausea and vomiting.  Genitourinary: Negative for difficulty urinating.  Skin: Negative for rash and wound.  Allergic/Immunologic: Positive for immunocompromised state.  Neurological: Negative for weakness.  Psychiatric/Behavioral: Negative for confusion.  All other systems reviewed and are negative.    Physical Exam Updated Vital Signs BP (!) 156/81   Pulse 84    Temp 98.2 F (36.8 C) (Oral)   Resp 17   SpO2 99%   Physical Exam  Constitutional: He is oriented to person, place, and time. He appears well-developed and well-nourished. No distress.  HENT:  Head:  Normocephalic and atraumatic.  Cardiovascular: Normal rate, regular rhythm, normal heart sounds and intact distal pulses.  No murmur heard. Pulmonary/Chest: Effort normal and breath sounds normal. No respiratory distress.  Abdominal: Soft. He exhibits no distension. There is no tenderness.  Suprapubic catheter in place, healing well, no surrounding erythema.   Neurological: He is alert and oriented to person, place, and time.  Skin: Skin is warm and dry. He is not diaphoretic.  Psychiatric: He has a normal mood and affect. His behavior is normal.  Nursing note and vitals reviewed.    ED Treatments / Results  Labs (all labs ordered are listed, but only abnormal results are displayed) Labs Reviewed  URINALYSIS, ROUTINE W REFLEX MICROSCOPIC - Abnormal; Notable for the following components:      Result Value   Hgb urine dipstick SMALL (*)    Protein, ur >=300 (*)    Leukocytes, UA TRACE (*)    Bacteria, UA RARE (*)    All other components within normal limits  URINE CULTURE    EKG None  Radiology No results found.  Procedures Procedures (including critical care time)  Medications Ordered in ED Medications - No data to display   Initial Impression / Assessment and Plan / ED Course  I have reviewed the triage vital signs and the nursing notes.  Pertinent labs & imaging results that were available during my care of the patient were reviewed by me and considered in my medical decision making (see chart for details).  Clinical Course as of Jan 30 2333  Thu Jan 30, 1874  [id number] 77 year old male with recent suprapubic catheter placement presents concerned that he was not discharged home with antibiotics after his surgery on Monday.  Urinalysis with small hemoglobin, greater  than 300 protein, trace leukocytes, rare bacteria with 11-20 white cells.  She is last urine culture from October 11 showed no growth.  Patient was given prescription for antibiotics, advised to hold prescription pending his culture report.  Patient plans to follow-up with his PCP regarding his chronic and unchanged pelvic pain following his radiation therapy.   [LM]    Clinical Course User Index [LM] Tacy Learn, PA-C   Final Clinical Impressions(s) / ED Diagnoses   Final diagnoses:  Suprapubic catheter ([place])  Bacteria in urine    ED Discharge Orders         Ordered    sulfamethoxazole-trimethoprim (BACTRIM DS,SEPTRA DS) 800-160 MG tablet  2 times daily,   Status:  Discontinued     01/30/18 2316    sulfamethoxazole-trimethoprim (BACTRIM DS,SEPTRA DS) 800-160 MG tablet  2 times daily     01/30/18 2331           Tacy Learn, PA-C 01/30/18 2334    Nat Christen, MD 01/31/18 1343

## 2018-01-31 DIAGNOSIS — E0865 Diabetes mellitus due to underlying condition with hyperglycemia: Secondary | ICD-10-CM | POA: Diagnosis not present

## 2018-01-31 DIAGNOSIS — R8279 Other abnormal findings on microbiological examination of urine: Secondary | ICD-10-CM | POA: Diagnosis not present

## 2018-01-31 DIAGNOSIS — R300 Dysuria: Secondary | ICD-10-CM | POA: Diagnosis not present

## 2018-01-31 DIAGNOSIS — R972 Elevated prostate specific antigen [PSA]: Secondary | ICD-10-CM | POA: Diagnosis not present

## 2018-01-31 DIAGNOSIS — N3090 Cystitis, unspecified without hematuria: Secondary | ICD-10-CM | POA: Diagnosis not present

## 2018-01-31 DIAGNOSIS — C61 Malignant neoplasm of prostate: Secondary | ICD-10-CM | POA: Diagnosis not present

## 2018-01-31 DIAGNOSIS — M25571 Pain in right ankle and joints of right foot: Secondary | ICD-10-CM | POA: Diagnosis not present

## 2018-01-31 DIAGNOSIS — I10 Essential (primary) hypertension: Secondary | ICD-10-CM | POA: Diagnosis not present

## 2018-01-31 DIAGNOSIS — Z6828 Body mass index (BMI) 28.0-28.9, adult: Secondary | ICD-10-CM | POA: Diagnosis not present

## 2018-02-01 LAB — URINE CULTURE: Culture: NO GROWTH

## 2018-02-07 ENCOUNTER — Ambulatory Visit (INDEPENDENT_AMBULATORY_CARE_PROVIDER_SITE_OTHER): Payer: Medicare Other | Admitting: Urology

## 2018-02-07 DIAGNOSIS — C61 Malignant neoplasm of prostate: Secondary | ICD-10-CM | POA: Diagnosis not present

## 2018-02-07 DIAGNOSIS — R338 Other retention of urine: Secondary | ICD-10-CM | POA: Diagnosis not present

## 2018-02-11 ENCOUNTER — Other Ambulatory Visit: Payer: Self-pay | Admitting: Urology

## 2018-02-11 DIAGNOSIS — R339 Retention of urine, unspecified: Secondary | ICD-10-CM

## 2018-02-13 ENCOUNTER — Ambulatory Visit (INDEPENDENT_AMBULATORY_CARE_PROVIDER_SITE_OTHER): Payer: Medicare Other | Admitting: Orthopedic Surgery

## 2018-02-21 ENCOUNTER — Other Ambulatory Visit: Payer: Self-pay | Admitting: Radiology

## 2018-02-24 ENCOUNTER — Ambulatory Visit (HOSPITAL_COMMUNITY)
Admission: RE | Admit: 2018-02-24 | Discharge: 2018-02-24 | Disposition: A | Discharge status: Home / Self Care | Payer: Medicare Other | Source: Ambulatory Visit | Attending: Urology | Admitting: Urology

## 2018-02-24 ENCOUNTER — Encounter (HOSPITAL_COMMUNITY): Payer: Self-pay

## 2018-02-24 DIAGNOSIS — K219 Gastro-esophageal reflux disease without esophagitis: Secondary | ICD-10-CM | POA: Insufficient documentation

## 2018-02-24 DIAGNOSIS — Z794 Long term (current) use of insulin: Secondary | ICD-10-CM | POA: Diagnosis not present

## 2018-02-24 DIAGNOSIS — Z79899 Other long term (current) drug therapy: Secondary | ICD-10-CM | POA: Insufficient documentation

## 2018-02-24 DIAGNOSIS — Z9889 Other specified postprocedural states: Secondary | ICD-10-CM | POA: Insufficient documentation

## 2018-02-24 DIAGNOSIS — Z466 Encounter for fitting and adjustment of urinary device: Secondary | ICD-10-CM | POA: Diagnosis not present

## 2018-02-24 DIAGNOSIS — M109 Gout, unspecified: Secondary | ICD-10-CM | POA: Diagnosis not present

## 2018-02-24 DIAGNOSIS — Z902 Acquired absence of lung [part of]: Secondary | ICD-10-CM | POA: Insufficient documentation

## 2018-02-24 DIAGNOSIS — T8384XA Pain from genitourinary prosthetic devices, implants and grafts, initial encounter: Secondary | ICD-10-CM | POA: Diagnosis not present

## 2018-02-24 DIAGNOSIS — E785 Hyperlipidemia, unspecified: Secondary | ICD-10-CM | POA: Insufficient documentation

## 2018-02-24 DIAGNOSIS — Z9049 Acquired absence of other specified parts of digestive tract: Secondary | ICD-10-CM | POA: Insufficient documentation

## 2018-02-24 DIAGNOSIS — M1990 Unspecified osteoarthritis, unspecified site: Secondary | ICD-10-CM | POA: Diagnosis not present

## 2018-02-24 DIAGNOSIS — Z888 Allergy status to other drugs, medicaments and biological substances status: Secondary | ICD-10-CM | POA: Insufficient documentation

## 2018-02-24 DIAGNOSIS — I10 Essential (primary) hypertension: Secondary | ICD-10-CM | POA: Diagnosis not present

## 2018-02-24 DIAGNOSIS — Z881 Allergy status to other antibiotic agents status: Secondary | ICD-10-CM | POA: Diagnosis not present

## 2018-02-24 DIAGNOSIS — R339 Retention of urine, unspecified: Secondary | ICD-10-CM | POA: Diagnosis not present

## 2018-02-24 DIAGNOSIS — I4891 Unspecified atrial fibrillation: Secondary | ICD-10-CM | POA: Diagnosis not present

## 2018-02-24 DIAGNOSIS — Z86718 Personal history of other venous thrombosis and embolism: Secondary | ICD-10-CM | POA: Diagnosis not present

## 2018-02-24 DIAGNOSIS — E1140 Type 2 diabetes mellitus with diabetic neuropathy, unspecified: Secondary | ICD-10-CM | POA: Insufficient documentation

## 2018-02-24 DIAGNOSIS — Z791 Long term (current) use of non-steroidal anti-inflammatories (NSAID): Secondary | ICD-10-CM | POA: Insufficient documentation

## 2018-02-24 DIAGNOSIS — N319 Neuromuscular dysfunction of bladder, unspecified: Secondary | ICD-10-CM | POA: Insufficient documentation

## 2018-02-24 HISTORY — PX: IR CATHETER TUBE CHANGE: IMG717

## 2018-02-24 LAB — BASIC METABOLIC PANEL
Anion gap: 8 (ref 5–15)
BUN: 13 mg/dL (ref 8–23)
CO2: 26 mmol/L (ref 22–32)
Calcium: 9.1 mg/dL (ref 8.9–10.3)
Chloride: 104 mmol/L (ref 98–111)
Creatinine, Ser: 1.19 mg/dL (ref 0.61–1.24)
GFR calc Af Amer: >60 mL/min (ref >60)
GFR calc non Af Amer: 59 mL/min — ABNORMAL LOW (ref >60)
Glucose, Bld: 106 mg/dL — ABNORMAL HIGH (ref 70–99)
Potassium: 3.6 mmol/L (ref 3.5–5.1)
Sodium: 138 mmol/L (ref 135–145)

## 2018-02-24 LAB — CBC WITH DIFFERENTIAL/PLATELET
Abs Immature Granulocytes: 0.03 10*3/uL (ref 0.00–0.07)
Basophils Absolute: 0 10*3/uL (ref 0.0–0.1)
Basophils Relative: 0 %
Eosinophils Absolute: 0.2 10*3/uL (ref 0.0–0.5)
Eosinophils Relative: 4 %
HCT: 38.1 % — ABNORMAL LOW (ref 39.0–52.0)
Hemoglobin: 12.4 g/dL — ABNORMAL LOW (ref 13.0–17.0)
Immature Granulocytes: 1 %
Lymphocytes Relative: 16 %
Lymphs Abs: 1 10*3/uL (ref 0.7–4.0)
MCH: 31.2 pg (ref 26.0–34.0)
MCHC: 32.5 g/dL (ref 30.0–36.0)
MCV: 95.7 fL (ref 80.0–100.0)
Monocytes Absolute: 0.5 10*3/uL (ref 0.1–1.0)
Monocytes Relative: 8 %
Neutro Abs: 4.4 10*3/uL (ref 1.7–7.7)
Neutrophils Relative %: 71 %
Platelets: 188 10*3/uL (ref 150–400)
RBC: 3.98 MIL/uL — ABNORMAL LOW (ref 4.22–5.81)
RDW: 13.1 % (ref 11.5–15.5)
WBC: 6.1 10*3/uL (ref 4.0–10.5)
nRBC: 0 % (ref 0.0–0.2)

## 2018-02-24 LAB — PROTIME-INR
INR: 0.99
Prothrombin Time: 13 seconds (ref 11.4–15.2)

## 2018-02-24 LAB — GLUCOSE, CAPILLARY: Glucose-Capillary: 89 mg/dL (ref 70–99)

## 2018-02-24 MED ORDER — IOPAMIDOL (ISOVUE-300) INJECTION 61%
50.0000 mL | Freq: Once | INTRAVENOUS | Status: AC | PRN
  Administered 2018-02-24: 15 mL

## 2018-02-24 MED ORDER — LIDOCAINE VISCOUS HCL 2 % MT SOLN
OROMUCOSAL | Status: AC
  Filled 2018-02-24: qty 15

## 2018-02-24 MED ORDER — FENTANYL CITRATE (PF) 100 MCG/2ML IJ SOLN
INTRAMUSCULAR | Status: AC | PRN
  Administered 2018-02-24 (×3): 50 ug via INTRAVENOUS
  Administered 2018-02-24 (×2): 25 ug via INTRAVENOUS

## 2018-02-24 MED ORDER — HYDROCODONE-ACETAMINOPHEN 5-325 MG PO TABS: 1.0000 | ORAL_TABLET | ORAL | Status: DC | PRN

## 2018-02-24 MED ORDER — FENTANYL CITRATE (PF) 100 MCG/2ML IJ SOLN
INTRAMUSCULAR | Status: AC
  Filled 2018-02-24: qty 2

## 2018-02-24 MED ORDER — CEFAZOLIN SODIUM-DEXTROSE 2-4 GM/100ML-% IV SOLN
INTRAVENOUS | Status: AC
  Administered 2018-02-24: 2 g via INTRAVENOUS
  Filled 2018-02-24: qty 100

## 2018-02-24 MED ORDER — CEFAZOLIN SODIUM-DEXTROSE 2-4 GM/100ML-% IV SOLN
2.0000 g | INTRAVENOUS | Status: AC
  Administered 2018-02-24: 2 g via INTRAVENOUS

## 2018-02-24 MED ORDER — MIDAZOLAM HCL 2 MG/2ML IJ SOLN
INTRAMUSCULAR | Status: AC | PRN
  Administered 2018-02-24 (×2): 1 mg via INTRAVENOUS

## 2018-02-24 MED ORDER — MIDAZOLAM HCL 2 MG/2ML IJ SOLN
INTRAMUSCULAR | Status: AC
  Filled 2018-02-24: qty 2

## 2018-02-24 MED ORDER — LIDOCAINE HCL 1 % IJ SOLN
INTRAMUSCULAR | Status: AC
  Filled 2018-02-24: qty 20

## 2018-02-24 MED ORDER — SODIUM CHLORIDE 0.9 % IV SOLN: INTRAVENOUS | Status: DC

## 2018-02-24 MED ORDER — IOPAMIDOL (ISOVUE-300) INJECTION 61%
INTRAVENOUS | Status: AC
  Administered 2018-02-24: 15 mL
  Filled 2018-02-24: qty 50

## 2018-02-24 NOTE — Procedures (Signed)
Interventional Radiology Procedure Note  Procedure: Suprapubic catheter change and upsize  Complications: None  Estimated Blood Loss: Minimal  Findings: Exchange of tube and patient now has a 16 Fr pigtail catheter.  Jonathan [name] R. Anselm Pancoast, M.D Pager:  [phone]

## 2018-02-24 NOTE — Discharge Instructions (Addendum)
Suprapubic Catheter Home Guide A suprapubic catheter is a rubber tube used to drain urine from the bladder into a collection bag. The catheter is inserted into the bladder through a small opening in the in the lower abdomen, near the center of the body, above the pubic bone (suprapubic area). There is a tiny balloon filled with germ-free (sterile) water on the end of the catheter that is in the bladder. The balloon helps to keep the catheter in place. Your suprapubic catheter may need to be replaced every 4-6 weeks, or as often as recommended by your health care provider. The collection bag must be emptied every day and cleaned every 2-3 days. The collection bag can be put beside your bed at night and attached to your leg during the day. You may have a large collection bag to use at night and a smaller one to use during the day. What are the risks?  Urine flow can become blocked. This can happen if the catheter is not working correctly, or if you have a blood clot in your bladder or in the catheter.  Tissue near the catheter may can become irritated and bleed.  Bacteria may get into your bladder and cause a urinary tract infection. How do I change the catheter? Supplies needed  Two pairs of sterile gloves.  Catheter.  Two syringes.  Sterile water.  Sterile cleaning solution.  Lubricant.  Collection bags. Changing the catheter To replace your catheter, take the following steps: 1. Drink plenty of fluids during the hours before you plan to change the catheter. 2. Wash your hands with soap and water. If soap and water are not available, use hand sanitizer. 3. Lie on your back and put on sterile gloves. 4. Clean the skin around the catheter opening using the sterile cleaning solution. 5. Remove the water from the balloon using a syringe. 6. Slowly remove the catheter. ? Do not pull on the catheter if it seems stuck. ? Call your health care provider immediately if you have difficulty  removing the catheter. 7. Take off the used gloves, and put on a new pair. 8. Put lubricant on the end of the new catheter that will go into your bladder. 9. Gently slide the catheter through the opening in your abdomen and into your bladder. 10. Wait for some urine to start flowing through the catheter. When urine starts to flow through the catheter, use a new syringe to fill the balloon with sterile water. 11. Attach the collection bag to the end of the catheter. Make sure the connection is tight. 12. Remove the gloves and wash your hands with soap and water.  How do I care for my skin around the catheter? Use a clean washcloth and soapy water to clean the skin around your catheter every day. Pat the area dry with a clean towel.  Do not pull on the catheter.  Do not use ointment or lotion on this area unless told by your health care provider.  Check your skin around the catheter every day for signs of infection. Check for: ? Redness, swelling, or pain. ? Fluid or blood. ? Warmth. ? Pus or a bad smell.  How do I clean and empty the collection bag? Clean the collection bag every 2-3 days, or as often as told by your health care provider. To do this, take the following steps:  Wash your hands with soap and water. If soap and water are not available, use hand sanitizer.  Disconnect the bag  from the catheter and immediately attach a new bag to the catheter.  Empty the used bag completely.  Clean the used bag using one of the following methods: ? Rinse the used bag with warm water and soap. ? Fill the bag with water and add 1 tsp of vinegar. Let it sit for about 30 minutes, then empty the bag.  Let the bag dry completely, and put it in a clean plastic bag before storing it.  Empty the large collection bag every 8 hours. Empty the small collection bag when it is about ? full. To empty your large or small collection bag, take the following steps:  Always keep the bag below the level  of the catheter. This keeps urine from flowing backwards into the catheter.  Hold the bag over the toilet or another container. Turn the valve (spigot) at the bottom of the bag to empty the urine. ? Do not touch the opening of the spigot. ? Do not let the opening touch the toilet or container.  Close the spigot tightly when the bag is empty.  What are some general tips?  Always wash your hands before and after caring for your catheter and collection bag. Use a mild, fragrance-free soap. If soap and water are not available, use hand sanitizer.  Clean the catheter with soap and water as often as told by your health care provider.  Always make sure there are no twists or curls (kinks) in the catheter tube.  Always make sure there are no leaks in the catheter or collection bag.  Drink enough fluid to keep your urine clear or pale yellow.  Do not take baths, swim, or use a hot tub. When should I seek medical care? Seek medical care if:  You leak urine.  You have redness, swelling, or pain around your catheter opening.  You have fluid or blood coming from your catheter opening.  Your catheter opening feels warm to the touch.  You have pus or a bad smell coming from your catheter opening.  You have a fever or chills.  Your urine flow slows down.  Your urine becomes cloudy or smelly.  When should I seek immediate medical care? Seek immediate medical care if your catheter comes out, or if you have:  Nausea.  Back pain.  Difficulty changing your catheter.  Blood in your urine.  No urine flow for 1 hour.  This information is not intended to replace advice given to you by your health care provider. Make sure you discuss any questions you have with your health care provider. Document Released: 11/28/2010 Document Revised: 11/09/2015 Document Reviewed: 11/23/2014 Elsevier Interactive Patient Education  2018 [place].   Moderate Conscious Sedation, Adult, Care  After These instructions provide you with information about caring for yourself after your procedure. Your health care provider may also give you more specific instructions. Your treatment has been planned according to current medical practices, but problems sometimes occur. Call your health care provider if you have any problems or questions after your procedure. What can I expect after the procedure? After your procedure, it is common:  To feel sleepy for several hours.  To feel clumsy and have poor balance for several hours.  To have poor judgment for several hours.  To vomit if you eat too soon.  Follow these instructions at home: For at least 24 hours after the procedure:   Do not: ? Participate in activities where you could fall or become injured. ? Drive. ?  Use heavy machinery. ? Drink alcohol. ? Take sleeping pills or medicines that cause drowsiness. ? Make important decisions or sign legal documents. ? Take care of children on your own.  Rest. Eating and drinking  Follow the diet recommended by your health care provider.  If you vomit: ? Drink water, juice, or soup when you can drink without vomiting. ? Make sure you have little or no nausea before eating solid foods. General instructions  Have a responsible adult stay with you until you are awake and alert.  Take over-the-counter and prescription medicines only as told by your health care provider.  If you smoke, do not smoke without supervision.  Keep all follow-up visits as told by your health care provider. This is important. Contact a health care provider if:  You keep feeling nauseous or you keep vomiting.  You feel light-headed.  You develop a rash.  You have a fever. Get help right away if:  You have trouble breathing. This information is not intended to replace advice given to you by your health care provider. Make sure you discuss any questions you have with your health care  provider. Document Released: 12/31/2012 Document Revised: 08/15/2015 Document Reviewed: 07/02/2015 Elsevier Interactive Patient Education  [employer].

## 2018-02-24 NOTE — H&P (Signed)
Referring Physician(s): Irine Seal  Supervising Physician: Markus Daft  Patient Status:  [facility]  Chief Complaint:  "I'm getting my bladder tube changed"  Subjective: Patient familiar to IR service from prior suprapubic catheter placement on 01/27/2018.  He has a history of neurogenic bladder, prostate carcinoma and chronic urinary retention.  He presents again today for suprapubic catheter exchange and upsizing.  He currently denies fever, headache, chest pain, dyspnea, cough, abdominal pain, vomiting or bleeding.  He does have some occasional right flank discomfort and intermittent nausea.  Past Medical History:  Diagnosis Date  . Acquired bladder diverticulum    12/ 2012  resection diverticulum done at [facility] in [place], [place]  . Age-related cataract of right eye    scheduled for catarat extraction 09-12-2017  . Agent orange exposure   . Aneurysm of infrarenal abdominal aorta ([place])    first dx 2017--- last CT 06-11-2017  measures 3.5cm  . Chronic constipation   . COPD with emphysema ([place])    06--12-2017  per pt no symptoms since Feb 2019  . Depression   . Diabetes mellitus type 2, diet-controlled ([place])   . Diabetic neuropathy ([place])   . Dyslipidemia   . Dyspnea    increased exertion; fumes  . Dyspnea on exertion   . Erectile dysfunction   . Feeling of incomplete bladder emptying   . GERD (gastroesophageal reflux disease)   . Gout    09-02-2017 last flare up 3 months ago  . Hiatal hernia   . History of atrial fibrillation    episode post op right lung lobectomy 07-04-2015  . History of bladder stone   . History of colon polyps   . History of DVT of lower extremity    03/ 2019  bilateral lower extremity (superficial) ---  per treated w/ oral medication   . History of gastritis   . History of kidney stones   . History of radiation therapy 09-14-2015  to 09-21-2015   left upper lung nodule -- 54Gy in 3 fractions (18 Gy per fraction)  . Hyperplasia of prostate with lower  urinary tract symptoms (LUTS)   . Hypertension   . Lumbar spinal stenosis   . Nephrolithiasis   . Neurogenic bladder   . Osteoarthritis    ankle, hands  . Osteoporosis   . Prostate cancer [facility]) urologist-  dr wrenn/  oncologist-  dr Tammi Klippel   dx 05-24-2017-- Stage T2b,  Gleason 4+4,  PSA 22.41,  vol 38cc--- started ADT 04/ 2019,  plan external radiation therapy  . Radiation fibrosis of lung ([place])    hx left upper long nodule SBRT 06/ 2017  . Squamous cell carcinoma of both lungs ([place]) last CT in epic dated 06-26-2017 no recurrence   dx 03/ 2017  non-small cell SCC via bronchoscopy w/ bx's by dr Roxan Hockey---  s/p  right VATS w/ right lobectomy and node dissection's 07-04-2015 /  pt had SBRT to left upper nodule Stage 1 ([facility]) completed 09-21-2015  . Wears dentures    bottom  . Wears glasses    Past Surgical History:  Procedure Laterality Date  . CARPAL TUNNEL RELEASE Right 07/09/2014   Procedure: RIGHT OPEN CARPAL TUNNEL RELEASE;  Surgeon: Mcarthur Rossetti, MD;  [facility];  Service: Orthopedics;  Laterality: Right;  . CHOLECYSTECTOMY N/A 02/24/2014   Procedure: LAPAROSCOPIC CHOLECYSTECTOMY;  Surgeon: Coralie Keens, MD;  Location: [place];  Service: General;  Laterality: N/A;  . COLONOSCOPY    .  [facility]  [facility] [place]  . CYSTOSCOPY W/ LITHOLAPAXY / EHL  02-24-2007   dr Janice Norrie  [facility]  . GOLD SEED IMPLANT N/A 09/05/2017   Procedure: GOLD SEED IMPLANT;  Surgeon: Irine Seal, MD;  [facility];  Service: Urology;  Laterality: N/A;  . SPACE OAR INSTILLATION N/A 09/05/2017   Procedure: SPACE OAR INSTILLATION;  Surgeon: Irine Seal, MD;  Location: [facility];  Service: Urology;  Laterality: N/A;  . TOTAL ANKLE ARTHROPLASTY Right 04/20/2015   Procedure: TOTAL ANKLE ARTHOPLASTY;  Surgeon: Newt Minion, MD;  Location: [place];  Service: Orthopedics;  Laterality: Right;  . TRANSTHORACIC  ECHOCARDIOGRAM  11/16/2012   ef 55-60%,  grade 1 diastolic dysfunction/  mild dilated ascending aorta/  mild MR/ trivial TR  . VIDEO ASSISTED THORACOSCOPY (VATS)/ LOBECTOMY Right 07/04/2015   Procedure: VIDEO ASSISTED THORACOSCOPY (VATS)/ RIGHT UPPER LOBECTOMY;  Surgeon: Melrose Nakayama, MD;  Location: [place];  Service: Thoracic;  Laterality: Right;  [name] VIDEO BRONCHOSCOPY N/A 06/17/2015   Procedure: VIDEO BRONCHOSCOPY;  Surgeon: Melrose Nakayama, MD;  Location: [place];  Service: Thoracic;  Laterality: N/A;  . VIDEO BRONCHOSCOPY WITH ENDOBRONCHIAL NAVIGATION N/A 06/17/2015   Procedure: VIDEO BRONCHOSCOPY WITH ENDOBRONCHIAL NAVIGATION;  Surgeon: Melrose Nakayama, MD;  Location: [place];  Service: Thoracic;  Laterality: N/A;  . VIDEO BRONCHOSCOPY WITH ENDOBRONCHIAL ULTRASOUND N/A 06/17/2015   Procedure: VIDEO BRONCHOSCOPY WITH ENDOBRONCHIAL ULTRASOUND;  Surgeon: Melrose Nakayama, MD;  [facility];  Service: Thoracic;  Laterality: N/A;      Allergies: Lisinopril; Ciprofloxacin; and Levaquin [levofloxacin]  Medications: Prior to Admission medications   Medication Sig Start Date End Date Taking? Authorizing Provider  ketorolac (ACULAR) 0.4 % SOLN Place 1 drop into the left eye 4 (four) times daily. 11/28/17  Yes [provider]  LANTUS SOLOSTAR 100 UNIT/ML Solostar Pen Inject 5 Units into the skin daily as needed (blood sugar < 114).  11/28/17  Yes [provider]  mirabegron ER (MYRBETRIQ) 50 MG TB24 tablet Take 50 mg by mouth daily.   Yes [provider]  ofloxacin (OCUFLOX) 0.3 % ophthalmic solution Place 1 drop into the left eye daily.  11/28/17  Yes [provider]  Oxycodone HCl 10 MG TABS Take 10 mg by mouth 4 (four) times daily as needed (pain).  07/18/15  Yes [provider]  prednisoLONE acetate (PRED FORTE) 1 % ophthalmic suspension Place 1 drop into both eyes daily.  11/28/17  Yes [provider]  SIMBRINZA 1-0.2 % SUSP Place 1  drop into both eyes daily.  09/24/17  Yes [provider]  tamsulosin (FLOMAX) 0.4 MG CAPS capsule Take 0.8 mg by mouth daily after supper. Reported on 10/12/2015   Yes [provider]  timolol (TIMOPTIC) 0.5 % ophthalmic solution Place 1 drop into both eyes daily. 01/09/18  Yes [provider]  atenolol (TENORMIN) 25 MG tablet Take 1 tablet (25 mg total) by mouth at bedtime. Patient taking differently: Take 25 mg by mouth daily after breakfast.  07/14/15   Lars Pinks M, PA-C  belladonna-opium (B&O SUPPRETTES) 16.2-30 MG suppository Place 1 suppository (30 mg total) rectally 2 (two) times daily as needed for pain (rectal spasms). 12/21/17   Duffy Bruce, MD  LINZESS 72 MCG capsule Take 72 mcg by mouth daily as needed (constipation).  10/03/17   [provider]  predniSONE (DELTASONE) 20 MG tablet Take 20 mg by mouth daily  as needed (for gout symptoms).    [provider]  sildenafil (VIAGRA) 100 MG tablet Take 100 mg by mouth daily as needed for erectile dysfunction.     [provider]     Vital Signs: Pressure 188/101, heart rate 92, temp 98.8, respirations 18, O2 sat 99% room air  Physical Exam awake, alert.  Chest clear to auscultation bilaterally.  Heart with regular rate and rhythm.  Abdomen soft, positive bowel sounds, intact suprapubic catheter draining yellow urine.  No significant lower extremity edema  Imaging: No results found.  Labs:  CBC: Recent Labs    12/10/17 2127 12/21/17 1353 01/27/18 0752 02/24/18 1037  WBC 5.3 4.7 7.3 6.1  HGB 12.1* 11.7* 11.3* 12.4*  HCT 35.8* 35.0* 34.3* 38.1*  PLT 200 205 168 188    COAGS: Recent Labs    01/27/18 0752 02/24/18 1037  INR 0.91 0.99  APTT 30  --     BMP: Recent Labs    06/11/17 1204 09/05/17 1129 12/10/17 2127 12/21/17 1353 01/27/18 0752  NA  --  141 140 143 141  K  --  3.6 3.9 4.2 3.5  CL  --   --  104 107 107  CO2  --   --  26 28 27   GLUCOSE  --   93 116* 107* 100*  BUN  --   --  13 13 16   CALCIUM  --   --  9.3 9.4 9.0  CREATININE 1.30*  --  1.30* 1.39* 1.08  GFRNONAA  --   --  52* 48* >60  GFRAA  --   --  >60 56* >60    LIVER FUNCTION TESTS: Recent Labs    12/10/17 2127 12/21/17 1353  BILITOT 0.6 0.5  AST 21 18  ALT 20 14  ALKPHOS 46 46  PROT 7.7 6.9  ALBUMIN 3.8 3.4*    Assessment and Plan: Pt with  history of neurogenic bladder, prostate carcinoma and chronic urinary retention.  Status post suprapubic catheter placement on 01/27/2018.  He presents again today for suprapubic catheter exchange and upsizing.  Details/risks of procedure, including but not limited to, internal bleeding, infection, injury to adjacent structures discussed with patient with his understanding and consent.   Electronically Signed: D. Rowe Robert, PA-C 02/24/2018, 11:07 AM   I spent a total of 20 minutes at the the patient's bedside AND on the patient's hospital floor or unit, greater than 50% of which was counseling/coordinating care for suprapubic catheter exchange/upsizing

## 2018-02-25 DIAGNOSIS — Z6828 Body mass index (BMI) 28.0-28.9, adult: Secondary | ICD-10-CM | POA: Diagnosis not present

## 2018-02-25 DIAGNOSIS — M25571 Pain in right ankle and joints of right foot: Secondary | ICD-10-CM | POA: Diagnosis not present

## 2018-02-25 DIAGNOSIS — N3090 Cystitis, unspecified without hematuria: Secondary | ICD-10-CM | POA: Diagnosis not present

## 2018-02-25 DIAGNOSIS — E0865 Diabetes mellitus due to underlying condition with hyperglycemia: Secondary | ICD-10-CM | POA: Diagnosis not present

## 2018-02-25 DIAGNOSIS — R972 Elevated prostate specific antigen [PSA]: Secondary | ICD-10-CM | POA: Diagnosis not present

## 2018-02-25 DIAGNOSIS — C61 Malignant neoplasm of prostate: Secondary | ICD-10-CM | POA: Diagnosis not present

## 2018-02-25 DIAGNOSIS — I10 Essential (primary) hypertension: Secondary | ICD-10-CM | POA: Diagnosis not present

## 2018-02-25 DIAGNOSIS — R300 Dysuria: Secondary | ICD-10-CM | POA: Diagnosis not present

## 2018-03-03 DIAGNOSIS — Z961 Presence of intraocular lens: Secondary | ICD-10-CM | POA: Diagnosis not present

## 2018-03-17 DIAGNOSIS — E1351 Other specified diabetes mellitus with diabetic peripheral angiopathy without gangrene: Secondary | ICD-10-CM | POA: Diagnosis not present

## 2018-03-17 DIAGNOSIS — M79672 Pain in left foot: Secondary | ICD-10-CM | POA: Diagnosis not present

## 2018-03-17 DIAGNOSIS — M79671 Pain in right foot: Secondary | ICD-10-CM | POA: Diagnosis not present

## 2018-03-17 DIAGNOSIS — B351 Tinea unguium: Secondary | ICD-10-CM | POA: Diagnosis not present

## 2018-03-22 DIAGNOSIS — R319 Hematuria, unspecified: Secondary | ICD-10-CM | POA: Insufficient documentation

## 2018-03-22 DIAGNOSIS — Z6828 Body mass index (BMI) 28.0-28.9, adult: Secondary | ICD-10-CM | POA: Diagnosis not present

## 2018-03-22 DIAGNOSIS — R300 Dysuria: Secondary | ICD-10-CM | POA: Diagnosis not present

## 2018-03-31 DIAGNOSIS — H35033 Hypertensive retinopathy, bilateral: Secondary | ICD-10-CM | POA: Diagnosis not present

## 2018-03-31 DIAGNOSIS — Z961 Presence of intraocular lens: Secondary | ICD-10-CM | POA: Diagnosis not present

## 2018-03-31 DIAGNOSIS — H401133 Primary open-angle glaucoma, bilateral, severe stage: Secondary | ICD-10-CM | POA: Diagnosis not present

## 2018-03-31 DIAGNOSIS — E119 Type 2 diabetes mellitus without complications: Secondary | ICD-10-CM | POA: Diagnosis not present

## 2018-04-02 DIAGNOSIS — I10 Essential (primary) hypertension: Secondary | ICD-10-CM | POA: Diagnosis not present

## 2018-04-02 DIAGNOSIS — N3090 Cystitis, unspecified without hematuria: Secondary | ICD-10-CM | POA: Diagnosis not present

## 2018-04-02 DIAGNOSIS — R972 Elevated prostate specific antigen [PSA]: Secondary | ICD-10-CM | POA: Diagnosis not present

## 2018-04-02 DIAGNOSIS — E0865 Diabetes mellitus due to underlying condition with hyperglycemia: Secondary | ICD-10-CM | POA: Diagnosis not present

## 2018-04-02 DIAGNOSIS — R300 Dysuria: Secondary | ICD-10-CM | POA: Diagnosis not present

## 2018-04-02 DIAGNOSIS — M25571 Pain in right ankle and joints of right foot: Secondary | ICD-10-CM | POA: Diagnosis not present

## 2018-04-02 DIAGNOSIS — Z6827 Body mass index (BMI) 27.0-27.9, adult: Secondary | ICD-10-CM | POA: Diagnosis not present

## 2018-04-02 DIAGNOSIS — R531 Weakness: Secondary | ICD-10-CM | POA: Diagnosis not present

## 2018-04-06 ENCOUNTER — Other Ambulatory Visit: Payer: Self-pay

## 2018-04-06 ENCOUNTER — Encounter (HOSPITAL_COMMUNITY): Payer: Self-pay

## 2018-04-06 ENCOUNTER — Emergency Department (HOSPITAL_COMMUNITY)
Admission: EM | Admit: 2018-04-06 | Discharge: 2018-04-06 | Disposition: A | Discharge status: Home / Self Care | Payer: Medicare Other | Attending: Emergency Medicine | Admitting: Emergency Medicine

## 2018-04-06 DIAGNOSIS — I10 Essential (primary) hypertension: Secondary | ICD-10-CM | POA: Insufficient documentation

## 2018-04-06 DIAGNOSIS — T83198A Other mechanical complication of other urinary devices and implants, initial encounter: Secondary | ICD-10-CM | POA: Diagnosis not present

## 2018-04-06 DIAGNOSIS — E1140 Type 2 diabetes mellitus with diabetic neuropathy, unspecified: Secondary | ICD-10-CM | POA: Insufficient documentation

## 2018-04-06 DIAGNOSIS — Z8546 Personal history of malignant neoplasm of prostate: Secondary | ICD-10-CM | POA: Diagnosis not present

## 2018-04-06 DIAGNOSIS — Z9359 Other cystostomy status: Secondary | ICD-10-CM | POA: Diagnosis not present

## 2018-04-06 DIAGNOSIS — K5900 Constipation, unspecified: Secondary | ICD-10-CM

## 2018-04-06 DIAGNOSIS — Z79899 Other long term (current) drug therapy: Secondary | ICD-10-CM | POA: Diagnosis not present

## 2018-04-06 DIAGNOSIS — J449 Chronic obstructive pulmonary disease, unspecified: Secondary | ICD-10-CM | POA: Insufficient documentation

## 2018-04-06 DIAGNOSIS — Z87891 Personal history of nicotine dependence: Secondary | ICD-10-CM | POA: Diagnosis not present

## 2018-04-06 LAB — BASIC METABOLIC PANEL
Anion gap: 8 (ref 5–15)
BUN: 15 mg/dL (ref 8–23)
CO2: 24 mmol/L (ref 22–32)
Calcium: 9 mg/dL (ref 8.9–10.3)
Chloride: 107 mmol/L (ref 98–111)
Creatinine, Ser: 1.2 mg/dL (ref 0.61–1.24)
GFR calc Af Amer: >60 mL/min (ref >60)
GFR calc non Af Amer: 58 mL/min — ABNORMAL LOW (ref >60)
Glucose, Bld: 103 mg/dL — ABNORMAL HIGH (ref 70–99)
Potassium: 3.5 mmol/L (ref 3.5–5.1)
Sodium: 139 mmol/L (ref 135–145)

## 2018-04-06 LAB — CBC
HCT: 37.7 % — ABNORMAL LOW (ref 39.0–52.0)
Hemoglobin: 12.1 g/dL — ABNORMAL LOW (ref 13.0–17.0)
MCH: 30.1 pg (ref 26.0–34.0)
MCHC: 32.1 g/dL (ref 30.0–36.0)
MCV: 93.8 fL (ref 80.0–100.0)
Platelets: 171 10*3/uL (ref 150–400)
RBC: 4.02 MIL/uL — ABNORMAL LOW (ref 4.22–5.81)
RDW: 13.6 % (ref 11.5–15.5)
WBC: 5.5 10*3/uL (ref 4.0–10.5)
nRBC: 0 % (ref 0.0–0.2)

## 2018-04-06 LAB — URINALYSIS, ROUTINE W REFLEX MICROSCOPIC
Bilirubin Urine: NEGATIVE
Glucose, UA: NEGATIVE mg/dL
Ketones, ur: NEGATIVE mg/dL
Nitrite: POSITIVE — AB
Protein, ur: >300 mg/dL — AB
Specific Gravity, Urine: >1.03 — ABNORMAL HIGH (ref 1.005–1.030)
pH: 6 (ref 5.0–8.0)

## 2018-04-06 LAB — URINALYSIS, MICROSCOPIC (REFLEX)

## 2018-04-06 NOTE — Discharge Instructions (Signed)
It was our pleasure to provide your ER care today - we hope that you feel better.  Complete the course of your antibiotic. Drink adequate fluids.  For constipation, get adequate fiber in diet, drink plenty of fluids, take colace (stool softener) 2x/day, and take miralax (laxative) once a day as needed.   Follow up with your doctor/urologist in the next 1-2 weeks - call office Monday for appointment.  Return to ER if worse, new symptoms, fevers, persistent vomiting, other concern.

## 2018-04-06 NOTE — ED Triage Notes (Signed)
Pt states that he has had a suprapubic catheter since December. Pt states it has not been changed since it was placed. Pt states that he has been getting infections in the area. Infection was cultured and pt was given abx on Friday. Pt is concerned that the infection has spread to his blood or kidneys

## 2018-04-06 NOTE — ED Provider Notes (Signed)
[place] DEPT Provider Note   CSN: [id number] Arrival date & time: 04/06/18  3710     History   Chief Complaint Chief Complaint  Patient presents with  . Suprapubic Catheter Infection    HPI Jonathan [name] is a 78 y.o. male.  Patient s/p suprapubic cath placement 2 months ago, presents wanting catheter site checked and also states has chronic/recurrent issues with constipation. Catheter was changed by IR 1 month ago. Yellow urine is draining into bag. Pt denies fever. For constipation, pt notes takes linzess prn. Last bm 3 days ago. No nausea or vomiting. Normal appetite. No abd pain.   The history is provided by the patient.    Past Medical History:  Diagnosis Date  . Acquired bladder diverticulum    12/ 2012  resection diverticulum done at [facility] in [place], [place]  . Age-related cataract of right eye    scheduled for catarat extraction 09-12-2017  . Agent orange exposure   . Aneurysm of infrarenal abdominal aorta ([place])    first dx 2017--- last CT 06-11-2017  measures 3.5cm  . Chronic constipation   . COPD with emphysema ([place])    06--12-2017  per pt no symptoms since Feb 2019  . Depression   . Diabetes mellitus type 2, diet-controlled ([place])   . Diabetic neuropathy ([place])   . Dyslipidemia   . Dyspnea    increased exertion; fumes  . Dyspnea on exertion   . Erectile dysfunction   . Feeling of incomplete bladder emptying   . GERD (gastroesophageal reflux disease)   . Gout    09-02-2017 last flare up 3 months ago  . Hiatal hernia   . History of atrial fibrillation    episode post op right lung lobectomy 07-04-2015  . History of bladder stone   . History of colon polyps   . History of DVT of lower extremity    03/ 2019  bilateral lower extremity (superficial) ---  per treated w/ oral medication   . History of gastritis   . History of kidney stones   . History of radiation therapy 09-14-2015  to 09-21-2015   left upper lung nodule --  54Gy in 3 fractions (18 Gy per fraction)  . Hyperplasia of prostate with lower urinary tract symptoms (LUTS)   . Hypertension   . Lumbar spinal stenosis   . Nephrolithiasis   . Neurogenic bladder   . Osteoarthritis    ankle, hands  . Osteoporosis   . Prostate cancer [facility]) urologist-  dr wrenn/  oncologist-  dr Tammi Klippel   dx 05-24-2017-- Stage T2b,  Gleason 4+4,  PSA 22.41,  vol 38cc--- started ADT 04/ 2019,  plan external radiation therapy  . Radiation fibrosis of lung ([place])    hx left upper long nodule SBRT 06/ 2017  . Squamous cell carcinoma of both lungs ([place]) last CT in epic dated 06-26-2017 no recurrence   dx 03/ 2017  non-small cell SCC via bronchoscopy w/ bx's by dr Roxan Hockey---  s/p  right VATS w/ right lobectomy and node dissection's 07-04-2015 /  pt had SBRT to left upper nodule Stage 1 ([facility]) completed 09-21-2015  . Wears dentures    bottom  . Wears glasses     Patient Active Problem List   Diagnosis Date Noted  . Malignant neoplasm of prostate ([place]) 07/10/2017  . Arthritis of right subtalar joint 04/18/2017  . 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 Idiopathic chronic venous hypertension  of both lower extremities with inflammation 05/22/2016  . Fibrosis of subtalar joint, right 03/08/2016  . Primary malignant neoplasm of left upper lobe of lung ([place]) 09/21/2015  . Lung cancer ([place]) 07/04/2015  . Diabetes mellitus type II, controlled ([place]) 06/01/2015  . 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 Emphysema of lung ([place])   . Kidney stones   . Osteoporosis   . H/O total ankle replacement, right 04/20/2015  . Carpal tunnel syndrome - right 07/09/2014  . Abdominal bloating 06/14/2014  . History of colonic polyps 06/14/2014  . Diabetic polyneuropathy associated with type 2 diabetes mellitus ([place]) 12/05/2012  . Lesion of ulnar nerve  12/05/2012  . Syncope 11/15/2012  . Gout 11/15/2012  . Constipation 04/02/2009  . CYSTITIS, RECURRENT 12/02/2008  . COPD 10/26/2008  . DIVERTICULUM, BLADDER 10/11/2008  . INSOMNIA 07/24/2008  . ACUTE CYSTITIS 07/20/2008  . FATIGUE 07/20/2008  . DEPRESSION 02/27/2008  . CHEST PAIN UNSPECIFIED 02/27/2008  . NICOTINE ADDICTION 08/12/2007  . DYSLIPIDEMIA 07/22/2007  . OBESITY 07/22/2007  . ERECTILE DYSFUNCTION 07/22/2007  . HYPERTENSION 07/22/2007    Past Surgical History:  Procedure Laterality Date  . CARPAL TUNNEL RELEASE Right 07/09/2014   Procedure: RIGHT OPEN CARPAL TUNNEL RELEASE;  Surgeon: Mcarthur Rossetti, MD;  [facility];  Service: Orthopedics;  Laterality: Right;  . CHOLECYSTECTOMY N/A 02/24/2014   Procedure: LAPAROSCOPIC CHOLECYSTECTOMY;  Surgeon: Coralie Keens, MD;  Location: [place];  Service: General;  Laterality: N/A;  . COLONOSCOPY    . [facility]  [facility]      [facility] [place]  . CYSTOSCOPY W/ LITHOLAPAXY / EHL  02-24-2007   dr Janice Norrie  [facility]  . GOLD SEED IMPLANT N/A 09/05/2017   Procedure: GOLD SEED IMPLANT;  Surgeon: Irine Seal, MD;  Location: [facility];  Service: Urology;  Laterality: N/A;  . IR CATHETER TUBE CHANGE  02/24/2018  . SPACE OAR INSTILLATION N/A 09/05/2017   Procedure: SPACE OAR INSTILLATION;  Surgeon: Irine Seal, MD;  Location: [facility];  Service: Urology;  Laterality: N/A;  . TOTAL ANKLE ARTHROPLASTY Right 04/20/2015   Procedure: TOTAL ANKLE ARTHOPLASTY;  Surgeon: Newt Minion, MD;  Location: [place];  Service: Orthopedics;  Laterality: Right;  . TRANSTHORACIC ECHOCARDIOGRAM  11/16/2012   ef 55-60%,  grade 1 diastolic dysfunction/  mild dilated ascending aorta/  mild MR/ trivial TR  . VIDEO ASSISTED THORACOSCOPY (VATS)/ LOBECTOMY Right 07/04/2015   Procedure: VIDEO ASSISTED THORACOSCOPY (VATS)/ RIGHT UPPER LOBECTOMY;  Surgeon: Melrose Nakayama, MD;  Location: [place];  Service:  Thoracic;  Laterality: Right;  [name] VIDEO BRONCHOSCOPY N/A 06/17/2015   Procedure: VIDEO BRONCHOSCOPY;  Surgeon: Melrose Nakayama, MD;  Location: [place];  Service: Thoracic;  Laterality: N/A;  . VIDEO BRONCHOSCOPY WITH ENDOBRONCHIAL NAVIGATION N/A 06/17/2015   Procedure: VIDEO BRONCHOSCOPY WITH ENDOBRONCHIAL NAVIGATION;  Surgeon: Melrose Nakayama, MD;  Location: [place];  Service: Thoracic;  Laterality: N/A;  . VIDEO BRONCHOSCOPY WITH ENDOBRONCHIAL ULTRASOUND N/A 06/17/2015   Procedure: VIDEO BRONCHOSCOPY WITH ENDOBRONCHIAL ULTRASOUND;  Surgeon: Melrose Nakayama, MD;  Location: [place];  Service: Thoracic;  Laterality: N/A;        Home Medications    Prior to Admission medications   Medication Sig Start Date End Date Taking? Authorizing Provider  atenolol (TENORMIN) 25 MG tablet Take 1 tablet (25 mg total) by mouth at bedtime.  Patient taking differently: Take 25 mg by mouth daily after breakfast.  07/14/15   Lars Pinks M, PA-C  belladonna-opium (B&O SUPPRETTES) 16.2-30 MG suppository Place 1 suppository (30 mg total) rectally 2 (two) times daily as needed for pain (rectal spasms). 12/21/17   Duffy Bruce, MD  ketorolac (ACULAR) 0.4 % SOLN Place 1 drop into the left eye 4 (four) times daily. 11/28/17   [provider]  LANTUS SOLOSTAR 100 UNIT/ML Solostar Pen Inject 5 Units into the skin daily as needed (blood sugar < 114).  11/28/17   [provider]  LINZESS 72 MCG capsule Take 72 mcg by mouth daily as needed (constipation).  10/03/17   [provider]  mirabegron ER (MYRBETRIQ) 50 MG TB24 tablet Take 50 mg by mouth daily.    [provider]  ofloxacin (OCUFLOX) 0.3 % ophthalmic solution Place 1 drop into the left eye daily.  11/28/17   [provider]  Oxycodone HCl 10 MG TABS Take 10 mg by mouth 4 (four) times daily as needed (pain).  07/18/15   [provider]  prednisoLONE acetate (PRED FORTE) 1 % ophthalmic suspension Place 1  drop into both eyes daily.  11/28/17   [provider]  predniSONE (DELTASONE) 20 MG tablet Take 20 mg by mouth daily as needed (for gout symptoms).    [provider]  sildenafil (VIAGRA) 100 MG tablet Take 100 mg by mouth daily as needed for erectile dysfunction.     [provider]  SIMBRINZA 1-0.2 % SUSP Place 1 drop into both eyes daily.  09/24/17   [provider]  tamsulosin (FLOMAX) 0.4 MG CAPS capsule Take 0.8 mg by mouth daily after supper. Reported on 10/12/2015    [provider]  timolol (TIMOPTIC) 0.5 % ophthalmic solution Place 1 drop into both eyes daily. 01/09/18   [provider]    Family History Family History  Problem Relation Age of Onset  . Cancer Father        Asbestos  . Colon cancer Neg Hx   . Colon polyps Neg Hx   . Kidney disease Neg Hx   . Esophageal cancer Neg Hx   . Gallbladder disease Neg Hx   . Heart disease Neg Hx   . Diabetes Neg Hx     Social History Social History   Tobacco Use  . Smoking status: Former Smoker    Packs/day: 0.50    Years: 40.00    Pack years: 20.00    Types: Cigarettes    Last attempt to quit: 02/13/2015    Years since quitting: 3.1  . Smokeless tobacco: Never Used  Substance Use Topics  . Alcohol use: No    Alcohol/week: 0.0 standard drinks  . Drug use: No     Allergies   Lisinopril; Ciprofloxacin; and Levaquin [levofloxacin]   Review of Systems Review of Systems  Constitutional: Negative for fever.  HENT: Negative for sore throat.   Eyes: Negative for redness.  Respiratory: Negative for shortness of breath.   Cardiovascular: Negative for chest pain.  Gastrointestinal: Positive for constipation. Negative for diarrhea and vomiting.  Genitourinary: Negative for decreased urine volume and flank pain.  Musculoskeletal: Negative for back pain.  Skin: Negative for rash.  Neurological: Negative for headaches.  Hematological: Does not bruise/bleed easily.    Psychiatric/Behavioral: Negative for confusion.     Physical Exam Updated Vital Signs BP 139/80 (BP Location: Left Arm)   Pulse 91   Temp (!) 97.4 F (36.3  C) (Oral)   Resp 18   Ht 2.032 m (6\' 8" )   Wt 113.9 kg   SpO2 99%   BMI 27.57 kg/m   Physical Exam Vitals signs and nursing note reviewed.  Constitutional:      Appearance: Normal appearance. He is well-developed.  HENT:     Head: Atraumatic.     Nose: Nose normal.  Eyes:     General: No scleral icterus.    Conjunctiva/sclera: Conjunctivae normal.  Neck:     Musculoskeletal: Normal range of motion and neck supple. No neck rigidity.     Trachea: No tracheal deviation.  Cardiovascular:     Rate and Rhythm: Normal rate and regular rhythm.     Pulses: Normal pulses.     Heart sounds: Normal heart sounds. No murmur. No friction rub. No gallop.   Pulmonary:     Effort: Pulmonary effort is normal. No accessory muscle usage or respiratory distress.     Breath sounds: Normal breath sounds.  Abdominal:     General: Bowel sounds are normal. There is no distension.     Palpations: Abdomen is soft.     Tenderness: There is no abdominal tenderness. There is no guarding.     Comments: Suprapubic site/catheter intact - no sign of infection to site. Urine draining into bag.   Genitourinary:    Comments: No cva tenderness. Normal external gu. Suprapubic cath in place. Rectal exam, moderate soft stool, no mass felt.  Musculoskeletal:        General: No swelling.  Skin:    General: Skin is warm and dry.     Findings: No rash.  Neurological:     Mental Status: He is alert.     Comments: Alert, speech clear.   Psychiatric:        Mood and Affect: Mood normal.      ED Treatments / Results  Labs (all labs ordered are listed, but only abnormal results are displayed) Results for orders placed or performed during the hospital encounter of 04/06/18  CBC  Result Value Ref Range   WBC 5.5 4.0 - 10.5 K/uL   RBC 4.02 (L) 4.22 -  5.81 MIL/uL   Hemoglobin 12.1 (L) 13.0 - 17.0 g/dL   HCT 37.7 (L) 39.0 - 52.0 %   MCV 93.8 80.0 - 100.0 fL   MCH 30.1 26.0 - 34.0 pg   MCHC 32.1 30.0 - 36.0 g/dL   RDW 13.6 11.5 - 15.5 %   Platelets 171 150 - 400 K/uL   nRBC 0.0 0.0 - 0.2 %  Basic metabolic panel  Result Value Ref Range   Sodium 139 135 - 145 mmol/L   Potassium 3.5 3.5 - 5.1 mmol/L   Chloride 107 98 - 111 mmol/L   CO2 24 22 - 32 mmol/L   Glucose, Bld 103 (H) 70 - 99 mg/dL   BUN 15 8 - 23 mg/dL   Creatinine, Ser 1.20 0.61 - 1.24 mg/dL   Calcium 9.0 8.9 - 10.3 mg/dL   GFR calc non Af Amer 58 (L) >60 mL/min   GFR calc Af Amer >60 >60 mL/min   Anion gap 8 5 - 15  Urinalysis, Routine w reflex microscopic  Result Value Ref Range   Color, Urine YELLOW YELLOW   APPearance TURBID (A) CLEAR   Specific Gravity, Urine >1.030 (H) 1.005 - 1.030   pH 6.0 5.0 - 8.0   Glucose, UA NEGATIVE NEGATIVE mg/dL   Hgb urine dipstick LARGE (A) NEGATIVE  Bilirubin Urine NEGATIVE NEGATIVE   Ketones, ur NEGATIVE NEGATIVE mg/dL   Protein, ur >300 (A) NEGATIVE mg/dL   Nitrite POSITIVE (A) NEGATIVE   Leukocytes, UA SMALL (A) NEGATIVE  Urinalysis, Microscopic (reflex)  Result Value Ref Range   RBC / HPF 21-50 0 - 5 RBC/hpf   WBC, UA 6-10 0 - 5 WBC/hpf   Bacteria, UA FEW (A) NONE SEEN   Squamous Epithelial / LPF 0-5 0 - 5   Non Squamous Epithelial PRESENT (A) NONE SEEN   Hyaline Casts, UA PRESENT    Ca Oxalate Crys, UA PRESENT     EKG None  Radiology No results found.  Procedures Procedures (including critical care time)  Medications Ordered in ED Medications - No data to display   Initial Impression / Assessment and Plan / ED Course  I have reviewed the triage vital signs and the nursing notes.  Pertinent labs & imaging results that were available during my care of the patient were reviewed by me and considered in my medical decision making (see chart for details).  Labs sent.  Reviewed nursing notes and prior charts  for additional history.   Labs reviewed - few wbc on ua. Blood work c/w baseline.   Prior ucxs neg. Pt does report just recently being given rx by pcp 2 days ago for uti - will complete that course.   abd soft nt. Afebrile. No vomiting.   Pt currently appears stable for d/c.     Final Clinical Impressions(s) / ED Diagnoses   Final diagnoses:  None    ED Discharge Orders    None       Lajean Saver, MD 04/06/18 1148

## 2018-04-17 ENCOUNTER — Emergency Department (HOSPITAL_COMMUNITY)
Admission: EM | Admit: 2018-04-17 | Discharge: 2018-04-17 | Disposition: A | Discharge status: Home / Self Care | Payer: Medicare Other | Attending: Emergency Medicine | Admitting: Emergency Medicine

## 2018-04-17 ENCOUNTER — Other Ambulatory Visit: Payer: Self-pay

## 2018-04-17 ENCOUNTER — Encounter (HOSPITAL_COMMUNITY): Payer: Self-pay | Admitting: Emergency Medicine

## 2018-04-17 DIAGNOSIS — R339 Retention of urine, unspecified: Secondary | ICD-10-CM | POA: Insufficient documentation

## 2018-04-17 DIAGNOSIS — Z960 Presence of urogenital implants: Secondary | ICD-10-CM | POA: Diagnosis not present

## 2018-04-17 DIAGNOSIS — I10 Essential (primary) hypertension: Secondary | ICD-10-CM | POA: Insufficient documentation

## 2018-04-17 DIAGNOSIS — Z87891 Personal history of nicotine dependence: Secondary | ICD-10-CM | POA: Insufficient documentation

## 2018-04-17 DIAGNOSIS — R301 Vesical tenesmus: Secondary | ICD-10-CM | POA: Diagnosis present

## 2018-04-17 DIAGNOSIS — Z85118 Personal history of other malignant neoplasm of bronchus and lung: Secondary | ICD-10-CM | POA: Diagnosis not present

## 2018-04-17 DIAGNOSIS — J449 Chronic obstructive pulmonary disease, unspecified: Secondary | ICD-10-CM | POA: Insufficient documentation

## 2018-04-17 DIAGNOSIS — Z8546 Personal history of malignant neoplasm of prostate: Secondary | ICD-10-CM | POA: Insufficient documentation

## 2018-04-17 DIAGNOSIS — E1140 Type 2 diabetes mellitus with diabetic neuropathy, unspecified: Secondary | ICD-10-CM | POA: Diagnosis not present

## 2018-04-17 DIAGNOSIS — Z923 Personal history of irradiation: Secondary | ICD-10-CM | POA: Insufficient documentation

## 2018-04-17 LAB — URINALYSIS, ROUTINE W REFLEX MICROSCOPIC
Bilirubin Urine: NEGATIVE
Glucose, UA: NEGATIVE mg/dL
Ketones, ur: NEGATIVE mg/dL
Nitrite: NEGATIVE
Protein, ur: NEGATIVE mg/dL
Specific Gravity, Urine: 1.01 (ref 1.005–1.030)
pH: 7 (ref 5.0–8.0)

## 2018-04-17 MED ORDER — NITROFURANTOIN MONOHYD MACRO 100 MG PO CAPS: 100.0000 mg | ORAL_CAPSULE | Freq: Two times a day (BID) | ORAL | 0 refills | Status: AC

## 2018-04-17 MED ORDER — HYDROCODONE-ACETAMINOPHEN 5-325 MG PO TABS: 1.0000 | ORAL_TABLET | Freq: Once | ORAL | Status: DC

## 2018-04-17 NOTE — ED Provider Notes (Signed)
Emergency Department Provider Note   I have reviewed the triage vital signs and the nursing notes.   HISTORY  Chief Complaint Urinary Frequency   HPI Jonathan [name] is a 78 y.o. male with PMH of COPD, DM, HLD, and prostate cancer with suprapubic catheter emergency department with bladder spasm type pain, apparent suprapubic catheter blockage, and a spontaneous urination from the penis.  Patient states that he has been having bladder spasm type symptoms over the past 2 days which have worsened significantly.  He has an appointment with his urology group tomorrow but felt like he could not wait.  He has been on empiric treatment for UTI with Macrobid.  He has noticed some drainage from around the suprapubic catheter site that has continued.  No fevers or chills.  No lower back pain.  He describes bladder spasm type pain and when he spasms he passes urine from his penis.    Past Medical History:  Diagnosis Date  . Acquired bladder diverticulum    12/ 2012  resection diverticulum done at [facility] in [place], [place]  . Age-related cataract of right eye    scheduled for catarat extraction 09-12-2017  . Agent orange exposure   . Aneurysm of infrarenal abdominal aorta ([place])    first dx 2017--- last CT 06-11-2017  measures 3.5cm  . Chronic constipation   . COPD with emphysema ([place])    06--12-2017  per pt no symptoms since Feb 2019  . Depression   . Diabetes mellitus type 2, diet-controlled ([place])   . Diabetic neuropathy ([place])   . Dyslipidemia   . Dyspnea    increased exertion; fumes  . Dyspnea on exertion   . Erectile dysfunction   . Feeling of incomplete bladder emptying   . GERD (gastroesophageal reflux disease)   . Gout    09-02-2017 last flare up 3 months ago  . Hiatal hernia   . History of atrial fibrillation    episode post op right lung lobectomy 07-04-2015  . History of bladder stone   . History of colon polyps   . History of DVT of lower extremity    03/ 2019  bilateral  lower extremity (superficial) ---  per treated w/ oral medication   . History of gastritis   . History of kidney stones   . History of radiation therapy 09-14-2015  to 09-21-2015   left upper lung nodule -- 54Gy in 3 fractions (18 Gy per fraction)  . Hyperplasia of prostate with lower urinary tract symptoms (LUTS)   . Hypertension   . Lumbar spinal stenosis   . Nephrolithiasis   . Neurogenic bladder   . Osteoarthritis    ankle, hands  . Osteoporosis   . Prostate cancer [facility]) urologist-  dr wrenn/  oncologist-  dr Tammi Klippel   dx 05-24-2017-- Stage T2b,  Gleason 4+4,  PSA 22.41,  vol 38cc--- started ADT 04/ 2019,  plan external radiation therapy  . Radiation fibrosis of lung ([place])    hx left upper long nodule SBRT 06/ 2017  . Squamous cell carcinoma of both lungs ([place]) last CT in epic dated 06-26-2017 no recurrence   dx 03/ 2017  non-small cell SCC via bronchoscopy w/ bx's by dr Roxan Hockey---  s/p  right VATS w/ right lobectomy and node dissection's 07-04-2015 /  pt had SBRT to left upper nodule Stage 1 ([facility]) completed 09-21-2015  . Wears dentures    bottom  . Wears glasses     Patient Active Problem  List   Diagnosis Date Noted  . Malignant neoplasm of prostate ([place]) 07/10/2017  . Arthritis of right subtalar joint 04/18/2017  . 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 Idiopathic chronic venous hypertension of both lower extremities with inflammation 05/22/2016  . Fibrosis of subtalar joint, right 03/08/2016  . Primary malignant neoplasm of left upper lobe of lung ([place]) 09/21/2015  . Lung cancer ([place]) 07/04/2015  . Diabetes mellitus type II, controlled ([place]) 06/01/2015  . 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 Emphysema of lung ([place])   . Kidney stones   . Osteoporosis   . H/O total ankle replacement, right 04/20/2015  . Carpal tunnel  syndrome - right 07/09/2014  . Abdominal bloating 06/14/2014  . History of colonic polyps 06/14/2014  . Diabetic polyneuropathy associated with type 2 diabetes mellitus ([place]) 12/05/2012  . Lesion of ulnar nerve 12/05/2012  . Syncope 11/15/2012  . Gout 11/15/2012  . Constipation 04/02/2009  . CYSTITIS, RECURRENT 12/02/2008  . COPD 10/26/2008  . DIVERTICULUM, BLADDER 10/11/2008  . INSOMNIA 07/24/2008  . ACUTE CYSTITIS 07/20/2008  . FATIGUE 07/20/2008  . DEPRESSION 02/27/2008  . CHEST PAIN UNSPECIFIED 02/27/2008  . NICOTINE ADDICTION 08/12/2007  . DYSLIPIDEMIA 07/22/2007  . OBESITY 07/22/2007  . ERECTILE DYSFUNCTION 07/22/2007  . HYPERTENSION 07/22/2007    Past Surgical History:  Procedure Laterality Date  . CARPAL TUNNEL RELEASE Right 07/09/2014   Procedure: RIGHT OPEN CARPAL TUNNEL RELEASE;  Surgeon: Mcarthur Rossetti, MD;  Location: [facility];  Service: Orthopedics;  Laterality: Right;  . CHOLECYSTECTOMY N/A 02/24/2014   Procedure: LAPAROSCOPIC CHOLECYSTECTOMY;  Surgeon: Coralie Keens, MD;  Location: [place];  Service: General;  Laterality: N/A;  . COLONOSCOPY    . [facility]ST[facility]ITH[facility]  [facility] [place]  . CYSTOSCOPY W/ LITHOLAPAXY / EHL  02-24-2007   dr Janice Norrie  [facility]  . GOLD SEED IMPLANT N/A 09/05/2017   Procedure: GOLD SEED IMPLANT;  Surgeon: Irine Seal, MD;  Location: [facility];  Service: Urology;  Laterality: N/A;  . IR CATHETER TUBE CHANGE  02/24/2018  . SPACE OAR INSTILLATION N/A 09/05/2017   Procedure: SPACE OAR INSTILLATION;  Surgeon: Irine Seal, MD;  Location: [facility];  Service: Urology;  Laterality: N/A;  . TOTAL ANKLE ARTHROPLASTY Right 04/20/2015   Procedure: TOTAL ANKLE ARTHOPLASTY;  Surgeon: Newt Minion, MD;  Location: [place];  Service: Orthopedics;  Laterality: Right;  . TRANSTHORACIC ECHOCARDIOGRAM  11/16/2012   ef 55-60%,  grade 1 diastolic dysfunction/  mild dilated ascending aorta/  mild MR/ trivial  TR  . VIDEO ASSISTED THORACOSCOPY (VATS)/ LOBECTOMY Right 07/04/2015   Procedure: VIDEO ASSISTED THORACOSCOPY (VATS)/ RIGHT UPPER LOBECTOMY;  Surgeon: Melrose Nakayama, MD;  Location: [place];  Service: Thoracic;  Laterality: Right;  [name] VIDEO BRONCHOSCOPY N/A 06/17/2015   Procedure: VIDEO BRONCHOSCOPY;  Surgeon: Melrose Nakayama, MD;  Location: [place];  Service: Thoracic;  Laterality: N/A;  . VIDEO BRONCHOSCOPY WITH ENDOBRONCHIAL NAVIGATION N/A 06/17/2015   Procedure: VIDEO BRONCHOSCOPY WITH ENDOBRONCHIAL NAVIGATION;  Surgeon: Melrose Nakayama, MD;  Location: [place];  Service: Thoracic;  Laterality: N/A;  . VIDEO BRONCHOSCOPY WITH ENDOBRONCHIAL ULTRASOUND N/A 06/17/2015   Procedure: VIDEO BRONCHOSCOPY WITH ENDOBRONCHIAL ULTRASOUND;  Surgeon: Melrose Nakayama, MD;  Location: [place];  Service: Thoracic;  Laterality: N/A;   Allergies Lisinopril;  Ciprofloxacin; and Levaquin [levofloxacin]  Family History  Problem Relation Age of Onset  . Cancer Father        Asbestos  . Colon cancer Neg Hx   . Colon polyps Neg Hx   . Kidney disease Neg Hx   . Esophageal cancer Neg Hx   . Gallbladder disease Neg Hx   . Heart disease Neg Hx   . Diabetes Neg Hx     Social History Social History   Tobacco Use  . Smoking status: Former Smoker    Packs/day: 0.50    Years: 40.00    Pack years: 20.00    Types: Cigarettes    Last attempt to quit: 02/13/2015    Years since quitting: 3.1  . Smokeless tobacco: Never Used  Substance Use Topics  . Alcohol use: No    Alcohol/week: 0.0 standard drinks  . Drug use: No    Review of Systems  Constitutional: No fever/chills Eyes: No visual changes. ENT: No sore throat. Cardiovascular: Denies chest pain. Respiratory: Denies shortness of breath. Gastrointestinal: Positive lower abdominal pain.  No nausea, no vomiting.  No diarrhea.  No constipation. Genitourinary: Negative for dysuria. Positive bladder spasm pain and urine incontinence.    Musculoskeletal: Negative for back pain. Skin: Negative for rash. Neurological: Negative for headaches, focal weakness or numbness.  10-point ROS otherwise negative.  ____________________________________________   PHYSICAL EXAM:  VITAL SIGNS: ED Triage Vitals  Enc Vitals Group     BP 04/17/18 0705 (!) 159/109     Pulse Rate 04/17/18 0705 97     Resp 04/17/18 0705 16     Temp 04/17/18 0705 98.1 F (36.7 C)     Temp Source 04/17/18 0705 Oral     SpO2 04/17/18 0705 97 %     Weight 04/17/18 0706 253 lb (114.8 kg)     Height 04/17/18 0706 6\' 8"  (2.032 m)     Pain Score 04/17/18 0706 8   Constitutional: Alert and oriented. Well appearing and in no acute distress. Eyes: Conjunctivae are normal.  Head: Atraumatic. Nose: No congestion/rhinnorhea. Mouth/Throat: Mucous membranes are moist.  Neck: No stridor.   Cardiovascular: Normal rate, regular rhythm. Good peripheral circulation. Grossly normal heart sounds.   Respiratory: Normal respiratory effort.  Gastrointestinal: Soft and nontender. No distention.  Genitourinary: Suprapubic cath in place with scant purulent material near the stoma. No cellulitis or obvious abscess.  Musculoskeletal: No lower extremity tenderness nor edema. No gross deformities of extremities. Neurologic:  Normal speech and language. No gross focal neurologic deficits are appreciated.  Skin:  Skin is warm, dry and intact. No rash noted.  ____________________________________________   LABS (all labs ordered are listed, but only abnormal results are displayed)  Labs Reviewed  URINALYSIS, ROUTINE W REFLEX MICROSCOPIC - Abnormal; Notable for the following components:      Result Value   Hgb urine dipstick MODERATE (*)    Leukocytes, UA MODERATE (*)    Bacteria, UA RARE (*)    All other components within normal limits  URINE CULTURE    ____________________________________________   PROCEDURES  Procedure(s) performed:    Procedures  None ____________________________________________   INITIAL IMPRESSION / ASSESSMENT AND PLAN / ED COURSE  Pertinent labs & imaging results that were available during my care of the patient were reviewed by me and considered in my medical decision making (see chart for details).  Patient presents to the emergency department with bladder spasms, pain, urine incontinence.  Plan for bladder scan to rule out  obstruction and urinary retention with overflow incontinence.  Plan to also obtain a urine analysis.   Patient is bladder scan shows urinary retention.  I was able to convince the patient to allow [name] to flush his suprapubic catheter which abruptly put out 400 mL's of urine.  Patient has dramatic resolution in bladder spasms/pain.  His UA is equivocal.  Suspect colonization given his chronic suprapubic catheter use.  He had been on Macrobid empirically through his urologist.  He plans to see them tomorrow.  I will start [place] here and have him keep his appointment tomorrow with routine catheter care at home. No concern for infection around the suprapubic catheter stoma.    ____________________________________________  FINAL CLINICAL IMPRESSION(S) / ED DIAGNOSES  Final diagnoses:  Urinary retention    NEW OUTPATIENT MEDICATIONS STARTED DURING THIS VISIT:  Discharge Medication List as of 04/17/2018  9:10 AM    START taking these medications   Details  nitrofurantoin, macrocrystal-monohydrate, (MACROBID) 100 MG capsule Take 1 capsule (100 mg total) by mouth 2 (two) times daily for 7 days., Starting Thu 04/17/2018, Until Thu 04/24/2018, Normal        Note:  This document was prepared using Dragon voice recognition software and may include unintentional dictation errors.  Nanda Quinton, MD Emergency Medicine    Long, Wonda Old[facility], MD 04/17/18 [phone]

## 2018-04-17 NOTE — ED Triage Notes (Signed)
Pt reports abdominal pain and urinary frequency. Pt has suprapubic catheter from prostate cancer. Pt has finished cancer tx. Pt states he not getting any urine when he self caths, but it is coming out of his penis. Pt states this began on Monday. Hx of frequent UTIs.

## 2018-04-17 NOTE — ED Notes (Signed)
Suprapubic catheter flushed with 70ml of sterile water. Pt's catheter had return of 426ml of yellow colored urine, foul odor present. Pt reports relief of pain.

## 2018-04-17 NOTE — Discharge Instructions (Signed)
You were seen in the ED today with urine obstruction. Call your Urologist today to confirm your appointment for tomorrow. Return to the ED with any new or worsening symptoms.

## 2018-04-18 ENCOUNTER — Ambulatory Visit (HOSPITAL_COMMUNITY): Payer: Medicare Other | Admitting: Anesthesiology

## 2018-04-18 ENCOUNTER — Encounter (HOSPITAL_COMMUNITY): Payer: Self-pay | Admitting: Emergency Medicine

## 2018-04-18 ENCOUNTER — Ambulatory Visit (INDEPENDENT_AMBULATORY_CARE_PROVIDER_SITE_OTHER): Payer: Medicare Other | Admitting: Urology

## 2018-04-18 ENCOUNTER — Encounter (HOSPITAL_COMMUNITY)
Admission: RE | Disposition: A | Discharge status: Home / Self Care | Payer: Self-pay | Source: Ambulatory Visit | Attending: Urology

## 2018-04-18 ENCOUNTER — Encounter (HOSPITAL_COMMUNITY): Payer: Self-pay | Admitting: Anesthesiology

## 2018-04-18 ENCOUNTER — Emergency Department (HOSPITAL_COMMUNITY)
Admission: EM | Admit: 2018-04-18 | Discharge: 2018-04-18 | Disposition: A | Discharge status: Home / Self Care | Payer: Medicare Other | Source: Home / Self Care | Attending: Emergency Medicine | Admitting: Emergency Medicine

## 2018-04-18 ENCOUNTER — Other Ambulatory Visit: Payer: Self-pay | Admitting: Urology

## 2018-04-18 ENCOUNTER — Ambulatory Visit (HOSPITAL_COMMUNITY)
Admission: RE | Admit: 2018-04-18 | Discharge: 2018-04-18 | Disposition: A | Discharge status: Home / Self Care | Payer: Medicare Other | Source: Ambulatory Visit | Attending: Urology | Admitting: Urology

## 2018-04-18 DIAGNOSIS — Z8546 Personal history of malignant neoplasm of prostate: Secondary | ICD-10-CM

## 2018-04-18 DIAGNOSIS — Z436 Encounter for attention to other artificial openings of urinary tract: Secondary | ICD-10-CM | POA: Diagnosis not present

## 2018-04-18 DIAGNOSIS — I10 Essential (primary) hypertension: Secondary | ICD-10-CM | POA: Insufficient documentation

## 2018-04-18 DIAGNOSIS — R339 Retention of urine, unspecified: Secondary | ICD-10-CM | POA: Diagnosis not present

## 2018-04-18 DIAGNOSIS — J449 Chronic obstructive pulmonary disease, unspecified: Secondary | ICD-10-CM | POA: Insufficient documentation

## 2018-04-18 DIAGNOSIS — T8384XA Pain from genitourinary prosthetic devices, implants and grafts, initial encounter: Secondary | ICD-10-CM | POA: Diagnosis not present

## 2018-04-18 DIAGNOSIS — Z96661 Presence of right artificial ankle joint: Secondary | ICD-10-CM | POA: Insufficient documentation

## 2018-04-18 DIAGNOSIS — E119 Type 2 diabetes mellitus without complications: Secondary | ICD-10-CM

## 2018-04-18 DIAGNOSIS — R338 Other retention of urine: Secondary | ICD-10-CM

## 2018-04-18 DIAGNOSIS — Z87891 Personal history of nicotine dependence: Secondary | ICD-10-CM

## 2018-04-18 DIAGNOSIS — Z79899 Other long term (current) drug therapy: Secondary | ICD-10-CM | POA: Insufficient documentation

## 2018-04-18 DIAGNOSIS — Z85118 Personal history of other malignant neoplasm of bronchus and lung: Secondary | ICD-10-CM

## 2018-04-18 DIAGNOSIS — Z79891 Long term (current) use of opiate analgesic: Secondary | ICD-10-CM | POA: Insufficient documentation

## 2018-04-18 DIAGNOSIS — K219 Gastro-esophageal reflux disease without esophagitis: Secondary | ICD-10-CM | POA: Diagnosis not present

## 2018-04-18 DIAGNOSIS — Z7952 Long term (current) use of systemic steroids: Secondary | ICD-10-CM | POA: Diagnosis not present

## 2018-04-18 DIAGNOSIS — Z791 Long term (current) use of non-steroidal anti-inflammatories (NSAID): Secondary | ICD-10-CM | POA: Insufficient documentation

## 2018-04-18 DIAGNOSIS — Z923 Personal history of irradiation: Secondary | ICD-10-CM | POA: Diagnosis not present

## 2018-04-18 DIAGNOSIS — G709 Myoneural disorder, unspecified: Secondary | ICD-10-CM | POA: Insufficient documentation

## 2018-04-18 DIAGNOSIS — Z881 Allergy status to other antibiotic agents status: Secondary | ICD-10-CM | POA: Diagnosis not present

## 2018-04-18 DIAGNOSIS — E1140 Type 2 diabetes mellitus with diabetic neuropathy, unspecified: Secondary | ICD-10-CM | POA: Diagnosis not present

## 2018-04-18 DIAGNOSIS — Z9049 Acquired absence of other specified parts of digestive tract: Secondary | ICD-10-CM

## 2018-04-18 DIAGNOSIS — F329 Major depressive disorder, single episode, unspecified: Secondary | ICD-10-CM | POA: Insufficient documentation

## 2018-04-18 DIAGNOSIS — Z794 Long term (current) use of insulin: Secondary | ICD-10-CM

## 2018-04-18 DIAGNOSIS — R330 Drug induced retention of urine: Secondary | ICD-10-CM | POA: Diagnosis not present

## 2018-04-18 HISTORY — PX: INSERTION OF SUPRAPUBIC CATHETER: SHX5870

## 2018-04-18 HISTORY — PX: CYSTOSCOPY: SHX5120

## 2018-04-18 LAB — BASIC METABOLIC PANEL
Anion gap: 6 (ref 5–15)
BUN: 12 mg/dL (ref 8–23)
CO2: 26 mmol/L (ref 22–32)
Calcium: 8.6 mg/dL — ABNORMAL LOW (ref 8.9–10.3)
Chloride: 105 mmol/L (ref 98–111)
Creatinine, Ser: 1.32 mg/dL — ABNORMAL HIGH (ref 0.61–1.24)
GFR calc Af Amer: >60 mL/min (ref >60)
GFR calc non Af Amer: 52 mL/min — ABNORMAL LOW (ref >60)
Glucose, Bld: 94 mg/dL (ref 70–99)
Potassium: 3.7 mmol/L (ref 3.5–5.1)
Sodium: 137 mmol/L (ref 135–145)

## 2018-04-18 LAB — GLUCOSE, CAPILLARY
Glucose-Capillary: 88 mg/dL (ref 70–99)
Glucose-Capillary: 106 mg/dL — ABNORMAL HIGH (ref 70–99)

## 2018-04-18 SURGERY — CYSTOSCOPY, FLEXIBLE
Anesthesia: General | Site: Bladder

## 2018-04-18 MED ORDER — SODIUM CHLORIDE 0.9% FLUSH: 3.0000 mL | INTRAVENOUS | Status: DC | PRN

## 2018-04-18 MED ORDER — CIPROFLOXACIN IN D5W 400 MG/200ML IV SOLN
INTRAVENOUS | Status: AC
  Filled 2018-04-18: qty 200

## 2018-04-18 MED ORDER — FENTANYL CITRATE (PF) 100 MCG/2ML IJ SOLN
50.0000 ug | INTRAMUSCULAR | Status: DC | PRN
  Administered 2018-04-18 (×2): 50 ug via INTRAVENOUS

## 2018-04-18 MED ORDER — HYDROMORPHONE HCL 1 MG/ML IJ SOLN: 0.2500 mg | INTRAMUSCULAR | Status: DC | PRN

## 2018-04-18 MED ORDER — MORPHINE SULFATE (PF) 2 MG/ML IV SOLN: 2.0000 mg | INTRAVENOUS | Status: DC | PRN

## 2018-04-18 MED ORDER — SODIUM CHLORIDE 0.9% FLUSH: 3.0000 mL | Freq: Two times a day (BID) | INTRAVENOUS | Status: DC

## 2018-04-18 MED ORDER — PROPOFOL 10 MG/ML IV BOLUS
INTRAVENOUS | Status: DC | PRN
  Administered 2018-04-18: 200 mg via INTRAVENOUS

## 2018-04-18 MED ORDER — LIDOCAINE HCL URETHRAL/MUCOSAL 2 % EX GEL
CUTANEOUS | Status: DC | PRN
  Administered 2018-04-18: 1 via TOPICAL

## 2018-04-18 MED ORDER — ONDANSETRON HCL 4 MG/2ML IJ SOLN
4.0000 mg | Freq: Once | INTRAMUSCULAR | Status: AC | PRN
  Administered 2018-04-18: 4 mg via INTRAVENOUS
  Filled 2018-04-18: qty 2

## 2018-04-18 MED ORDER — 0.9 % SODIUM CHLORIDE (POUR BTL) OPTIME
TOPICAL | Status: DC | PRN
  Administered 2018-04-18: 1000 mL

## 2018-04-18 MED ORDER — STERILE WATER FOR IRRIGATION IR SOLN
Status: DC | PRN
  Administered 2018-04-18: 1000 mL

## 2018-04-18 MED ORDER — ACETAMINOPHEN 325 MG PO TABS: 650.0000 mg | ORAL_TABLET | ORAL | Status: DC | PRN

## 2018-04-18 MED ORDER — CIPROFLOXACIN IN D5W 400 MG/200ML IV SOLN
400.0000 mg | INTRAVENOUS | Status: AC
  Administered 2018-04-18: 400 mg via INTRAVENOUS

## 2018-04-18 MED ORDER — SODIUM CHLORIDE 0.9 % IV SOLN: 250.0000 mL | INTRAVENOUS | Status: DC | PRN

## 2018-04-18 MED ORDER — STERILE WATER FOR IRRIGATION IR SOLN
Status: DC | PRN
  Administered 2018-04-18: 3000 mL

## 2018-04-18 MED ORDER — FENTANYL CITRATE (PF) 100 MCG/2ML IJ SOLN
INTRAMUSCULAR | Status: AC
  Filled 2018-04-18: qty 2

## 2018-04-18 MED ORDER — KETOROLAC TROMETHAMINE 30 MG/ML IJ SOLN: 30.0000 mg | Freq: Once | INTRAMUSCULAR | Status: DC | PRN

## 2018-04-18 MED ORDER — FENTANYL CITRATE (PF) 100 MCG/2ML IJ SOLN
INTRAMUSCULAR | Status: DC | PRN
  Administered 2018-04-18 (×2): 50 ug via INTRAVENOUS

## 2018-04-18 MED ORDER — MEPERIDINE HCL 50 MG/ML IJ SOLN: 6.2500 mg | INTRAMUSCULAR | Status: DC | PRN

## 2018-04-18 MED ORDER — LACTATED RINGERS IV SOLN
INTRAVENOUS | Status: DC
  Administered 2018-04-18: 14:00:00 via INTRAVENOUS

## 2018-04-18 MED ORDER — MIDAZOLAM HCL 2 MG/2ML IJ SOLN
INTRAMUSCULAR | Status: AC
  Filled 2018-04-18: qty 2

## 2018-04-18 MED ORDER — SUCCINYLCHOLINE CHLORIDE 20 MG/ML IJ SOLN
INTRAMUSCULAR | Status: DC | PRN
  Administered 2018-04-18: 140 mg via INTRAVENOUS

## 2018-04-18 MED ORDER — ACETAMINOPHEN 650 MG RE SUPP
650.0000 mg | RECTAL | Status: DC | PRN
  Filled 2018-04-18: qty 1

## 2018-04-18 MED ORDER — OXYCODONE HCL 5 MG PO TABS: 5.0000 mg | ORAL_TABLET | ORAL | Status: DC | PRN

## 2018-04-18 MED ORDER — HYDROCODONE-ACETAMINOPHEN 7.5-325 MG PO TABS: 1.0000 | ORAL_TABLET | Freq: Once | ORAL | Status: DC | PRN

## 2018-04-18 MED ORDER — ROCURONIUM BROMIDE 100 MG/10ML IV SOLN
INTRAVENOUS | Status: DC | PRN
  Administered 2018-04-18: 5 mg via INTRAVENOUS

## 2018-04-18 MED ORDER — EPHEDRINE SULFATE 50 MG/ML IJ SOLN
INTRAMUSCULAR | Status: DC | PRN
  Administered 2018-04-18: 10 mg via INTRAVENOUS

## 2018-04-18 MED ORDER — MIDAZOLAM HCL 5 MG/5ML IJ SOLN
INTRAMUSCULAR | Status: DC | PRN
  Administered 2018-04-18: 1 mg via INTRAVENOUS

## 2018-04-18 SURGICAL SUPPLY — 23 items
BAG DRAIN URO TABLE W/ADPT NS (BAG) ×4 IMPLANT
BAG DRN 8 ADPR NS SKTRN CSTL (BAG) ×2
BAG URINE DRAINAGE (UROLOGICAL SUPPLIES) ×4 IMPLANT
BAG URINE LEG 500ML (DRAIN) ×4 IMPLANT
BLADE SURG 15 STRL LF DISP TIS (BLADE) ×2 IMPLANT
BLADE SURG 15 STRL SS (BLADE) ×4
CATH FOLEY 2WAY SLVR  5CC 16FR (CATHETERS) ×2
CATH FOLEY 2WAY SLVR 5CC 16FR (CATHETERS) IMPLANT
CLOTH BEACON ORANGE TIMEOUT ST (SAFETY) ×8 IMPLANT
COVER LIGHT HANDLE STERIS (MISCELLANEOUS) ×12 IMPLANT
COVER WAND RF STERILE (DRAPES) ×2 IMPLANT
ELECT REM PT RETURN 9FT ADLT (ELECTROSURGICAL) ×4
ELECTRODE REM PT RTRN 9FT ADLT (ELECTROSURGICAL) ×2 IMPLANT
GLOVE SURG SS PI 8.0 STRL IVOR (GLOVE) ×4 IMPLANT
GOWN STRL REUS W/TWL LRG LVL3 (GOWN DISPOSABLE) ×8 IMPLANT
KIT BLADEGUARD II DBL (SET/KITS/TRAYS/PACK) ×4 IMPLANT
MANIFOLD NEPTUNE II (INSTRUMENTS) ×4 IMPLANT
NS IRRIG 1000ML POUR BTL (IV SOLUTION) ×4 IMPLANT
PACK CYSTO (CUSTOM PROCEDURE TRAY) ×4 IMPLANT
PAD ARMBOARD 7.5X6 YLW CONV (MISCELLANEOUS) ×4 IMPLANT
TOWEL OR 17X26 4PK STRL BLUE (TOWEL DISPOSABLE) ×8 IMPLANT
WATER STERILE IRR 1000ML POUR (IV SOLUTION) ×2 IMPLANT
WATER STERILE IRR 3000ML UROMA (IV SOLUTION) ×4 IMPLANT

## 2018-04-18 NOTE — Transfer of Care (Signed)
Immediate Anesthesia Transfer of Care Note  Patient: Jonathan [name]  Procedure(s) Performed: Erlene Quan (N/A Bladder) SUPRAPUBIC TUBE CHANGE (N/A )  Patient Location: PACU  Anesthesia Type:General  Level of Consciousness: awake and alert   Airway & Oxygen Therapy: Patient Spontanous Breathing  Post-op Assessment: Report given to RN  Post vital signs: Reviewed and stable  Last Vitals:  Vitals Value Taken Time  BP 123/76 04/18/2018  3:17 PM  Temp    Pulse 80 04/18/2018  3:19 PM  Resp 16 04/18/2018  3:19 PM  SpO2 100 % 04/18/2018  3:19 PM  Vitals shown include unvalidated device data.  Last Pain:  Vitals:   04/18/18 1355  TempSrc:   PainSc: 3       Patients Stated Pain Goal: 6 (12/45/80 9983)  Complications: No apparent anesthesia complications

## 2018-04-18 NOTE — Discharge Instructions (Signed)
Suprapubic Catheter Home Guide A suprapubic catheter is a rubber tube used to drain urine from the bladder into a collection bag. The catheter is inserted into the bladder through a small opening in the in the lower abdomen, near the center of the body, above the pubic bone (suprapubic area). There is a tiny balloon filled with germ-free (sterile) water on the end of the catheter that is in the bladder. The balloon helps to keep the catheter in place. Your suprapubic catheter may need to be replaced every 4-6 weeks, or as often as recommended by your health care provider. The collection bag must be emptied every day and cleaned every 2-3 days. The collection bag can be put beside your bed at night and attached to your leg during the day. You may have a large collection bag to use at night and a smaller one to use during the day. What are the risks?  Urine flow can become blocked. This can happen if the catheter is not working correctly, or if you have a blood clot in your bladder or in the catheter.  Tissue near the catheter may can become irritated and bleed.  Bacteria may get into your bladder and cause a urinary tract infection. How do I change the catheter? Supplies needed  Two pairs of sterile gloves.  Catheter.  Two syringes.  Sterile water.  Sterile cleaning solution.  Lubricant.  Collection bags. Changing the catheter To replace your catheter, take the following steps: 1. Drink plenty of fluids during the hours before you plan to change the catheter. 2. Wash your hands with soap and water. If soap and water are not available, use hand sanitizer. 3. Lie on your back and put on sterile gloves. 4. Clean the skin around the catheter opening using the sterile cleaning solution. 5. Remove the water from the balloon using a syringe. 6. Slowly remove the catheter. ? Do not pull on the catheter if it seems stuck. ? Call your health care provider immediately if you have difficulty  removing the catheter. 7. Take off the used gloves, and put on a new pair. 8. Put lubricant on the end of the new catheter that will go into your bladder. 9. Gently slide the catheter through the opening in your abdomen and into your bladder. 10. Wait for some urine to start flowing through the catheter. When urine starts to flow through the catheter, use a new syringe to fill the balloon with sterile water. 11. Attach the collection bag to the end of the catheter. Make sure the connection is tight. 12. Remove the gloves and wash your hands with soap and water. How do I care for my skin around the catheter? Use a clean washcloth and soapy water to clean the skin around your catheter every day. Pat the area dry with a clean towel.  Do not pull on the catheter.  Do not use ointment or lotion on this area unless told by your health care provider.  Check your skin around the catheter every day for signs of infection. Check for: ? Redness, swelling, or pain. ? Fluid or blood. ? Warmth. ? Pus or a bad smell. How do I clean and empty the collection bag? Clean the collection bag every 2-3 days, or as often as told by your health care provider. To do this, take the following steps:  Wash your hands with soap and water. If soap and water are not available, use hand sanitizer.  Disconnect the bag from the  catheter and immediately attach a new bag to the catheter.  Empty the used bag completely.  Clean the used bag using one of the following methods: ? Rinse the used bag with warm water and soap. ? Fill the bag with water and add 1 tsp of vinegar. Let it sit for about 30 minutes, then empty the bag.  Let the bag dry completely, and put it in a clean plastic bag before storing it. Empty the large collection bag every 8 hours. Empty the small collection bag when it is about ? full. To empty your large or small collection bag, take the following steps:  Always keep the bag below the level of the  catheter. This keeps urine from flowing backwards into the catheter.  Hold the bag over the toilet or another container. Turn the valve (spigot) at the bottom of the bag to empty the urine. ? Do not touch the opening of the spigot. ? Do not let the opening touch the toilet or container.  Close the spigot tightly when the bag is empty. What are some general tips?   Always wash your hands before and after caring for your catheter and collection bag. Use a mild, fragrance-free soap. If soap and water are not available, use hand sanitizer.  Clean the catheter with soap and water as often as told by your health care provider.  Always make sure there are no twists or curls (kinks) in the catheter tube.  Always make sure there are no leaks in the catheter or collection bag.  Drink enough fluid to keep your urine clear or pale yellow.  Do not take baths, swim, or use a hot tub. When should I seek medical care? Seek medical care if:  You leak urine.  You have redness, swelling, or pain around your catheter opening.  You have fluid or blood coming from your catheter opening.  Your catheter opening feels warm to the touch.  You have pus or a bad smell coming from your catheter opening.  You have a fever or chills.  Your urine flow slows down.  Your urine becomes cloudy or smelly. When should I seek immediate medical care? Seek immediate medical care if your catheter comes out, or if you have:  Nausea.  Back pain.  Difficulty changing your catheter.  Blood in your urine.  No urine flow for 1 hour.  If you would like to try to urinate without the catheter, please let my office know and we can get you a catheter plug to use to plug the catheter to see if you are able to urinate on your own and if you are successful, we could remove the tube at some point.   This information is not intended to replace advice given to you by your health care provider. Make sure you discuss any  questions you have with your health care provider. Document Released: 11/28/2010 Document Revised: 11/09/2015 Document Reviewed: 11/23/2014 Elsevier Interactive Patient Education  2019 [employer].

## 2018-04-18 NOTE — Anesthesia Preprocedure Evaluation (Signed)
Anesthesia Evaluation  Patient identified by MRN, date of birth, ID band Patient awake    Reviewed: Allergy & Precautions, H&P , NPO status , Patient's Chart, lab work & pertinent test results, reviewed documented beta blocker date and time   Airway Mallampati: II  TM Distance: >3 FB Neck ROM: full    Dental no notable dental hx. (+) Poor Dentition, Missing   Pulmonary shortness of breath, COPD, former smoker,    Pulmonary exam normal breath sounds clear to auscultation       Cardiovascular Exercise Tolerance: Good hypertension, negative cardio ROS   Rhythm:regular Rate:Normal     Neuro/Psych PSYCHIATRIC DISORDERS Depression  Neuromuscular disease    GI/Hepatic Neg liver ROS, hiatal hernia, GERD  ,  Endo/Other  negative endocrine ROSdiabetes  Renal/GU Renal disease  negative genitourinary   Musculoskeletal   Abdominal   Peds  Hematology negative hematology ROS (+)   Anesthesia Other Findings   Reproductive/Obstetrics negative OB ROS                             Anesthesia Physical Anesthesia Plan  ASA: III  Anesthesia Plan: General   Post-op Pain Management:    Induction:   PONV Risk Score and Plan:   Airway Management Planned:   Additional Equipment:   Intra-op Plan:   Post-operative Plan:   Informed Consent: I have reviewed the patients History and Physical, chart, labs and discussed the procedure including the risks, benefits and alternatives for the proposed anesthesia with the patient or authorized representative who has indicated his/her understanding and acceptance.     Dental Advisory Given  Plan Discussed with: CRNA  Anesthesia Plan Comments:         Anesthesia Quick Evaluation

## 2018-04-18 NOTE — Progress Notes (Signed)
Dr Rick Duff aware of CBG 88.  No orders given.

## 2018-04-18 NOTE — Anesthesia Postprocedure Evaluation (Signed)
Anesthesia Post Note  Patient: Jonathan [name]  Procedure(s) Performed: CYSTOSCOPY FLEXIBLE (N/A Bladder) SUPRAPUBIC TUBE CHANGE (N/A )  Patient location during evaluation: PACU Anesthesia Type: General Level of consciousness: awake and alert and oriented Pain management: pain level controlled Vital Signs Assessment: post-procedure vital signs reviewed and stable Respiratory status: spontaneous breathing Cardiovascular status: stable and blood pressure returned to baseline Postop Assessment: no apparent nausea or vomiting Anesthetic complications: no     Last Vitals:  Vitals:   04/18/18 1405 04/18/18 1408  BP:    Pulse:    Resp: 16 12  Temp:    SpO2: 96% 96%    Last Pain:  Vitals:   04/18/18 1355  TempSrc:   PainSc: 3                  Braxtyn Dorff

## 2018-04-18 NOTE — ED Provider Notes (Addendum)
[facility] EMERGENCY DEPARTMENT Provider Note   CSN: [id number] Arrival date & time: 04/18/18  0636     History   Chief Complaint Chief Complaint  Patient presents with  . Suprapubic catheter problem    HPI Jonathan [name] is a 78 y.o. male.  Patient presents to the emergency department with complaints of urinary retention.  Patient has a suprapubic catheter.  He reports that he has been having trouble with it recently.  He was seen in the ER yesterday morning for retention.  He had his catheter irrigated at that time and it began flowing.  He reports he did well through the day but then has not passed any urine since around midnight.  He is experiencing lower abdominal and bladder pain and spasms.     Past Medical History:  Diagnosis Date  . Acquired bladder diverticulum    12/ 2012  resection diverticulum done at [facility] in [place], [place]  . Age-related cataract of right eye    scheduled for catarat extraction 09-12-2017  . Agent orange exposure   . Aneurysm of infrarenal abdominal aorta ([place])    first dx 2017--- last CT 06-11-2017  measures 3.5cm  . Chronic constipation   . COPD with emphysema ([place])    06--12-2017  per pt no symptoms since Feb 2019  . Depression   . Diabetes mellitus type 2, diet-controlled ([place])   . Diabetic neuropathy ([place])   . Dyslipidemia   . Dyspnea    increased exertion; fumes  . Dyspnea on exertion   . Erectile dysfunction   . Feeling of incomplete bladder emptying   . GERD (gastroesophageal reflux disease)   . Gout    09-02-2017 last flare up 3 months ago  . Hiatal hernia   . History of atrial fibrillation    episode post op right lung lobectomy 07-04-2015  . History of bladder stone   . History of colon polyps   . History of DVT of lower extremity    03/ 2019  bilateral lower extremity (superficial) ---  per treated w/ oral medication   . History of gastritis   . History of kidney stones   . History of radiation therapy 09-14-2015   to 09-21-2015   left upper lung nodule -- 54Gy in 3 fractions (18 Gy per fraction)  . Hyperplasia of prostate with lower urinary tract symptoms (LUTS)   . Hypertension   . Lumbar spinal stenosis   . Nephrolithiasis   . Neurogenic bladder   . Osteoarthritis    ankle, hands  . Osteoporosis   . Prostate cancer [facility]) urologist-  dr wrenn/  oncologist-  dr Tammi Klippel   dx 05-24-2017-- Stage T2b,  Gleason 4+4,  PSA 22.41,  vol 38cc--- started ADT 04/ 2019,  plan external radiation therapy  . Radiation fibrosis of lung ([place])    hx left upper long nodule SBRT 06/ 2017  . Squamous cell carcinoma of both lungs ([place]) last CT in epic dated 06-26-2017 no recurrence   dx 03/ 2017  non-small cell SCC via bronchoscopy w/ bx's by dr Roxan Hockey---  s/p  right VATS w/ right lobectomy and node dissection's 07-04-2015 /  pt had SBRT to left upper nodule Stage 1 ([facility]) completed 09-21-2015  . Wears dentures    bottom  . Wears glasses     Patient Active Problem List   Diagnosis Date Noted  . Malignant neoplasm of prostate ([place]) 07/10/2017  . Arthritis of right subtalar joint 04/18/2017  .  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 Idiopathic chronic venous hypertension of both lower extremities with inflammation 05/22/2016  . Fibrosis of subtalar joint, right 03/08/2016  . Primary malignant neoplasm of left upper lobe of lung ([place]) 09/21/2015  . Lung cancer ([place]) 07/04/2015  . Diabetes mellitus type II, controlled ([place]) 06/01/2015  . 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 Emphysema of lung ([place])   . Kidney stones   . Osteoporosis   . H/O total ankle replacement, right 04/20/2015  . Carpal tunnel syndrome - right 07/09/2014  . Abdominal bloating 06/14/2014  . History of colonic polyps 06/14/2014  . Diabetic polyneuropathy associated with type 2 diabetes mellitus  ([place]) 12/05/2012  . Lesion of ulnar nerve 12/05/2012  . Syncope 11/15/2012  . Gout 11/15/2012  . Constipation 04/02/2009  . CYSTITIS, RECURRENT 12/02/2008  . COPD 10/26/2008  . DIVERTICULUM, BLADDER 10/11/2008  . INSOMNIA 07/24/2008  . ACUTE CYSTITIS 07/20/2008  . FATIGUE 07/20/2008  . DEPRESSION 02/27/2008  . CHEST PAIN UNSPECIFIED 02/27/2008  . NICOTINE ADDICTION 08/12/2007  . DYSLIPIDEMIA 07/22/2007  . OBESITY 07/22/2007  . ERECTILE DYSFUNCTION 07/22/2007  . HYPERTENSION 07/22/2007    Past Surgical History:  Procedure Laterality Date  . CARPAL TUNNEL RELEASE Right 07/09/2014   Procedure: RIGHT OPEN CARPAL TUNNEL RELEASE;  Surgeon: Mcarthur Rossetti, MD;  Location: [facility];  Service: Orthopedics;  Laterality: Right;  . CHOLECYSTECTOMY N/A 02/24/2014   Procedure: LAPAROSCOPIC CHOLECYSTECTOMY;  Surgeon: Coralie Keens, MD;  Location: [place];  Service: General;  Laterality: N/A;  . COLONOSCOPY    . [facility]ST[facility]ITHOT[facility]  [facility]/ [facility]      [facility] [place]  . CYSTOSCOPY W/ LITHOLAPAXY / EHL  02-24-2007   dr Janice Norrie  [facility]  . GOLD SEED IMPLANT N/A 09/05/2017   Procedure: GOLD SEED IMPLANT;  Surgeon: Irine Seal, MD;  Location: [facility];  Service: Urology;  Laterality: N/A;  . IR CATHETER TUBE CHANGE  02/24/2018  . SPACE OAR INSTILLATION N/A 09/05/2017   Procedure: SPACE OAR INSTILLATION;  Surgeon: Irine Seal, MD;  Location: [facility];  Service: Urology;  Laterality: N/A;  . TOTAL ANKLE ARTHROPLASTY Right 04/20/2015   Procedure: TOTAL ANKLE ARTHOPLASTY;  Surgeon: Newt Minion, MD;  Location: [place];  Service: Orthopedics;  Laterality: Right;  . TRANSTHORACIC ECHOCARDIOGRAM  11/16/2012   ef 55-60%,  grade 1 diastolic dysfunction/  mild dilated ascending aorta/  mild MR/ trivial TR  . VIDEO ASSISTED THORACOSCOPY (VATS)/ LOBECTOMY Right 07/04/2015   Procedure: VIDEO ASSISTED THORACOSCOPY (VATS)/ RIGHT UPPER LOBECTOMY;  Surgeon: Melrose Nakayama, MD;  Location: [place];  Service: Thoracic;  Laterality: Right;  [name] VIDEO BRONCHOSCOPY N/A 06/17/2015   Procedure: VIDEO BRONCHOSCOPY;  Surgeon: Melrose Nakayama, MD;  Location: [place];  Service: Thoracic;  Laterality: N/A;  . VIDEO BRONCHOSCOPY WITH ENDOBRONCHIAL NAVIGATION N/A 06/17/2015   Procedure: VIDEO BRONCHOSCOPY WITH ENDOBRONCHIAL NAVIGATION;  Surgeon: Melrose Nakayama, MD;  Location: [place];  Service: Thoracic;  Laterality: N/A;  . VIDEO BRONCHOSCOPY WITH ENDOBRONCHIAL ULTRASOUND N/A 06/17/2015   Procedure: VIDEO BRONCHOSCOPY WITH ENDOBRONCHIAL ULTRASOUND;  Surgeon: Melrose Nakayama, MD;  Location: [place];  Service: Thoracic;  Laterality: N/A;        Home Medications    Prior to Admission medications   Medication Sig Start Date End Date Taking? Authorizing Provider  atenolol (TENORMIN) 25 MG tablet Take 1 tablet (25 mg total) by mouth at bedtime. Patient not taking: Reported on 04/06/2018 07/14/15   Nani Skillern, PA-C  atenolol-chlorthalidone (TENORETIC) 50-25 MG tablet Take 1 tablet by mouth daily.    [provider]  belladonna-opium (B&O SUPPRETTES) 16.2-30 MG suppository Place 1 suppository (30 mg total) rectally 2 (two) times daily as needed for pain (rectal spasms). Patient not taking: Reported on 04/06/2018 12/21/17   Duffy Bruce, MD  gabapentin (NEURONTIN) 400 MG capsule Take 400 mg by mouth 2 (two) times daily.    [provider]  ketorolac (ACULAR) 0.4 % SOLN Place 1 drop into the left eye 4 (four) times daily. 11/28/17   [provider]  LANTUS SOLOSTAR 100 UNIT/ML Solostar Pen Inject 5 Units into the skin daily.  11/28/17   [provider]  LINZESS 72 MCG capsule Take 72 mcg by mouth every other day.  10/03/17   [provider]  meloxicam (MOBIC) 15 MG tablet Take 15 mg by mouth daily.    [provider]  mirabegron ER (MYRBETRIQ) 50 MG TB24 tablet Take 50 mg by mouth daily.    [provider]  nicotine polacrilex (NICORETTE) 4 MG gum Take 4 mg by mouth as needed for smoking cessation.    [provider]  nitrofurantoin (MACRODANTIN) 100 MG capsule Take 100 mg by mouth 2 (two) times daily. 04/04/18   [provider]  nitrofurantoin, macrocrystal-monohydrate, (MACROBID) 100 MG capsule Take 1 capsule (100 mg total) by mouth 2 (two) times daily for 7 days. 04/17/18 04/24/18  Long, Wonda Olds, MD  ofloxacin (OCUFLOX) 0.3 % ophthalmic solution Place 1 drop into the left eye daily.  11/28/17   [provider]  Oxycodone HCl 20 MG TABS Take 10 mg by mouth 3 (three) times daily. 02/02/18   [provider]  prednisoLONE acetate (PRED FORTE) 1 % ophthalmic suspension Place 1 drop into both eyes daily.  11/28/17   [provider]  predniSONE (DELTASONE) 20 MG tablet Take 20-40 mg by mouth daily as needed (for gout symptoms).     [provider]  sildenafil (VIAGRA) 100 MG tablet Take 100 mg by mouth daily as needed for erectile dysfunction.     [provider]  SIMBRINZA 1-0.2 % SUSP Place 1 drop into both eyes daily.  09/24/17   [provider]  tamsulosin (FLOMAX) 0.4 MG CAPS capsule Take 0.4 mg by mouth daily after supper. Reported on 10/12/2015    [provider]  timolol (TIMOPTIC) 0.5 % ophthalmic solution Place 1 drop into both eyes daily. 01/09/18   [provider]    Family History Family History  Problem Relation Age of Onset  . Cancer Father        Asbestos  . Colon cancer Neg Hx   . Colon polyps Neg Hx   . Kidney disease Neg Hx   . Esophageal cancer Neg Hx   . Gallbladder disease Neg Hx   . Heart disease Neg Hx   . Diabetes Neg Hx     Social History Social History   Tobacco Use  . Smoking status: Former Smoker    Packs/day: 0.50    Years: 40.00    Pack years: 20.00    Types: Cigarettes    Last attempt to quit: 02/13/2015    Years since quitting: 3.1  . Smokeless tobacco:  Never Used  Substance Use Topics  . Alcohol use: No  Alcohol/week: 0.0 standard drinks  . Drug use: No     Allergies   Lisinopril; Ciprofloxacin; and Levaquin [levofloxacin]   Review of Systems Review of Systems  Genitourinary: Positive for decreased urine volume.  All other systems reviewed and are negative.    Physical Exam Updated Vital Signs BP (!) 153/93 (BP Location: Left Arm)   Pulse 96   Temp 97.7 F (36.5 C) (Oral)   Resp 16   SpO2 98%   Physical Exam Vitals signs and nursing note reviewed.  Constitutional:      General: He is not in acute distress.    Appearance: Normal appearance. He is well-developed.  HENT:     Head: Normocephalic and atraumatic.     Right Ear: Hearing normal.     Left Ear: Hearing normal.     Nose: Nose normal.  Eyes:     Conjunctiva/sclera: Conjunctivae normal.     Pupils: Pupils are equal, round, and reactive to light.  Neck:     Musculoskeletal: Normal range of motion and neck supple.  Cardiovascular:     Rate and Rhythm: Regular rhythm.     Heart sounds: S1 normal and S2 normal. No murmur. No friction rub. No gallop.   Pulmonary:     Effort: Pulmonary effort is normal. No respiratory distress.     Breath sounds: Normal breath sounds.  Chest:     Chest wall: No tenderness.  Abdominal:     General: Bowel sounds are normal.     Palpations: Abdomen is soft.     Tenderness: There is abdominal tenderness in the suprapubic area. There is no guarding or rebound. Negative signs include Murphy's sign and McBurney's sign.     Hernia: No hernia is present.  Musculoskeletal: Normal range of motion.  Skin:    General: Skin is warm and dry.     Findings: No rash.  Neurological:     Mental Status: He is alert and oriented to person, place, and time.     GCS: GCS eye subscore is 4. GCS verbal subscore is 5. GCS motor subscore is 6.     Cranial Nerves: No cranial nerve deficit.     Sensory: No sensory deficit.     Coordination:  Coordination normal.  Psychiatric:        Speech: Speech normal.        Behavior: Behavior normal.        Thought Content: Thought content normal.      ED Treatments / Results  Labs (all labs ordered are listed, but only abnormal results are displayed) Labs Reviewed - No data to display  EKG None  Radiology No results found.  Procedures Procedures (including critical care time)  Medications Ordered in ED Medications - No data to display   Initial Impression / Assessment and Plan / ED Course  I have reviewed the triage vital signs and the nursing notes.  Pertinent labs & imaging results that were available during my care of the patient were reviewed by me and considered in my medical decision making (see chart for details).     Patient with chronic suprapubic catheter, presents with obstruction of his catheter.  He had a similar presentation yesterday morning.  At that time catheter was irrigated and it began to flow again.  Patient reports that he has follow-up with his urologist at 11 AM.  Catheter will be irrigated once again and he will then be sent to urology for further evaluation and treatment.  Patient  has had retention for 2 days in a row now.  His renal function up to now has been normal.  Will check a be met.  I asked patient if he wanted to wait for results or have his urologist reviewed the results at 11 AM.  He feels comfortable being discharged and having urology review lab work.  He has a culture pending from yesterday morning also.  Final Clinical Impressions(s) / ED Diagnoses   Final diagnoses:  Urinary retention    ED Discharge Orders    None       Parveen Freehling, Gwenyth Allegra, MD 04/18/18 0051    Orpah Greek, MD 04/18/18 [phone]

## 2018-04-18 NOTE — H&P (Signed)
CC: I have urinary retention.  HPI: Jonathan [name] is a 78 year-old male established patient who is here for urinary retention.    Jonathan [name] returns today from the ER where he was seen for his SP tube and bladder spasms. He was irrigated but remains in pain and has some drainage around the tube. His tube was exchanged with a 48f loop SP tube in IR on 02/24/18 and hasn't been changed since. He has had several trips to the ER with this and is miserable and unable to sleep.      ALLERGIES: Bactrim Cipro Erythromycin    MEDICATIONS: Viagra 100 mg tablet  Atenolol-Chlorthalidone 50 mg-25 mg tablet  Belladonna-Opium 60 mg-16.2 mg suppository, rectal 1 suppository PR Q 6 H PRN  Chantix 0.5 mg (11)-1 mg (42) tablet, dose pack  Flomax 0.4 mg capsule  Furosemide 40 MG Oral Tablet Oral  Ibuprofen TABS Oral  Klor-Con 10 10 meq tablet, extended release  Latanoprost 0.005 % drops  Linzess 72 mcg capsule  Metoprolol Tartrate 50 MG Oral Tablet Oral  Mupirocin 2 % ointment  Oxycodone Hcl 10 mg tablet  Pantoprazole Sodium 20 mg tablet, delayed release  PredniSONE TABS Oral  Promethazine Hcl 25 mg tablet  Reglan 5 mg tablet  Simbrinza 1 %-0.2 % suspension, drops  Spiriva  Vitamin D TABS Oral  Zanaflex 4 mg tablet     GU PSH: Cysto Bladder Stone <2.5cm - 2008 Partial Cystectomy - about 2011 PLACE RT DEVICE/MARKER, PROS - 09/05/2017 Prostate Needle Biopsy - 05/24/2017 Transperineal Plmt Biodegradable Matrl 1/Mlt Njx - 09/05/2017      PSH Notes: Bladder Surgery,  Cystoscopy With Fragmentation Of Bladder Calculus  lung surgery  ankle surgery   NON-GU PSH: Carpal tunnel surgery Cholecystectomy (laparoscopic) Lung lobectomy, Left - about 2015 Surgical Pathology, Gross And Microscopic Examination For Prostate Needle - 05/24/2017    GU PMH: Prostate Cancer, Labs today. Lupron given. He will need Lupron and labs in 6 months and I will have him return with a PSA in 3 months to assess the  possibility of a voiding trial. - 02/07/2018, He will need Lupron in November. , - 12/06/2017, He will return in November for his prostate cancer f/u. , - 11/15/2017, He is doing well on ADT with improvement in his voiding symptoms. he has seen Dr. MTammi Klippeland needs to get set up for Fiducials and [place] He was given Lupron 422mtoday and will return in 6 months with labs for his next injection. I will get a PSA and testosterone today since he didn't get that done yet. , - 07/26/2017, He has high risk T3b N0 M0 Gleason 9 prostate cancer that is likely as not related to his agent orange exposure. I have discussed options for therapy but with his locally advanced disease and comorbidities I think he will be best served with ADT and EXRT. I will have him return for [place] 24044mnd see Dr. MatLedon Snarer consideration of radiation therapy. He will see me back in 6-8 weeks for a PSA and testosterone with Lupron 20m57m will place fiducials and SpaceOAR based on Dr. MannJohny Shearsommendations. , - 06/12/2017 Urinary Retention, He failed a voiding trial today. I discussed the options and I will get him set up for a suprapubic tube insertion by IR. - 01/03/2018, (Worsening), - 12/06/2017, He is in retention from the radiation and wants to get back on CIC and I have given him a script for caths. , - 11/15/2017 ED  due to arterial insufficiency, He has severe ED. - 06/12/2017 Elevated PSA - 05/24/2017, He has a markedly elevated PSA with a right apical nodule that is suspicious for cancer. I am going to get him set up for a biopsy and reviewed the risks of bleeding, infection and voiding difficulty. I will send antibiotics based on his urine culture today and will repeat a UA and culture a week prior to the biopsy. I will repeat his PSA today along with a testosterone since he is on chronic narcotics and has some testicular atrophy. , - 05/10/2017 Incomplete bladder emptying, His PVR is 290 ml today. I discussed double  voiding. - 05/10/2017 Prostate nodule w/ LUTS, He has a long history of voiding difficulty and remains on tamsulosin. He will need a flowrate and cystoscopy at some point. - 05/10/2017 Bladder Diverticulum, Diverticulum, bladder acquired - 2014 Bladder, Neuromuscular dysfunction, Unspec, Neurogenic bladder - 2014 Obstructive and reflux uropathy, Unspec, Obstructive uropathy - 2014 Personal Hx Oth male genital organs diseases, History of epididymitis - 2014 Urinary Tract Inf, Unspec site, Urinary tract infection - 2014      PMH Notes: Hiatal hernia.  lung cancer  Unspecified autoimmune disease.    NON-GU PMH: Anxiety Arthritis Duodenal ulcer, unspecified as acute or chronic, without hemorrhage or perforation Encounter for general adult medical examination without abnormal findings, Encounter for preventive health examination GERD Hypertension Other intervertebral disc degeneration, lumbosacral region Type 2 diabetes mellitus with diabetic neuropathy, unspecified    FAMILY HISTORY: Death In The Family Father - Runs In Family Death In The Family Mother - Runs In Family Family Health Status Number - Runs In Family   SOCIAL HISTORY: Marital Status: Single Preferred Language: English; Ethnicity: Not Hispanic Or Latino; Race: Black or African American Current Smoking Status: Patient does not smoke anymore. Has not smoked since 04/26/2013. Smoked for 30 years. Smoked 1/2 pack per day.   Tobacco Use Assessment Completed: Used Tobacco in last 30 days? Has never drank.  Drinks 2 caffeinated drinks per day. Patient's occupation is/was retired.    REVIEW OF SYSTEMS:    GU Review Male:   Patient denies frequent urination, hard to postpone urination, burning/ pain with urination, get up at night to urinate, leakage of urine, stream starts and stops, trouble starting your stream, have to strain to urinate , erection problems, and penile pain.  Gastrointestinal (Upper):   Patient denies nausea,  vomiting, and indigestion/ heartburn.  Gastrointestinal (Lower):   Patient denies diarrhea and constipation.  Constitutional:   Patient denies fever, night sweats, weight loss, and fatigue.  Skin:   Patient denies skin rash/ lesion and itching.  Eyes:   Patient denies blurred vision and double vision.  Ears/ Nose/ Throat:   Patient denies sore throat and sinus problems.  Hematologic/Lymphatic:   Patient denies swollen glands and easy bruising.  Cardiovascular:   Patient denies leg swelling and chest pains.  Respiratory:   Patient denies cough and shortness of breath.  Endocrine:   Patient denies excessive thirst.  Musculoskeletal:   Patient denies back pain and joint pain.  Neurological:   Patient denies headaches and dizziness.  Psychologic:   Patient denies depression and anxiety.   VITAL SIGNS: None   MULTI-SYSTEM PHYSICAL EXAMINATION:    Constitutional: Well-nourished. No physical deformities. Normally developed. Good grooming.  Respiratory: Normal breath sounds. No labored breathing, no use of accessory muscles.   Cardiovascular: Regular rate and rhythm. No murmur, no gallop.   Gastrointestinal: SP tube site is clean.  PAST DATA REVIEWED:  Source Of History:  Patient  Records Review:   Previous Hospital Records   02/07/18 07/26/17 06/26/17  PSA  Total PSA 0.1 ng/dl 3.3 ng/dl 15.8 ng/dl    02/07/18 07/26/17 06/26/17  Hormones  Testosterone, Total 14 pg/dL 31 pg/dL 304 pg/dL   Notes:                     ER records reviewed.    PROCEDURES: None   ASSESSMENT:      ICD-10 Details  1 GU:   Urinary Retention - R33.8 He is overdue for an SP tube change. I am going to take him to the OR and change his tube under MAC with possible cystoscopy. I have reviewed the risks of the procedure in detail.   2 NON-GU:   Encounter for attention to other artificial openings of urinary tract - Z43.6 Worsening   PLAN:           Schedule Return Visit/Planned Activity: ASAP - Schedule  Surgery             Note: SP tube change and cystoscopy today.           Document Letter(s):  Created for Patient: Clinical Summary         Next Appointment:      Next Appointment: 08/15/2018 10:30 AM    Appointment Type: Office Visit Established Patient    Location: [facility] - [address]    Provider: [facility]    Reason for Visit: 6 mo followup w/ Lupron \T\ Labs

## 2018-04-18 NOTE — Anesthesia Procedure Notes (Signed)
Procedure Name: Intubation Date/Time: 04/18/2018 2:38 PM Performed by: Ollen Bowl, CRNA Pre-anesthesia Checklist: Patient identified, Patient being monitored, Timeout performed, Emergency Drugs available and Suction available Patient Re-evaluated:Patient Re-evaluated prior to induction Oxygen Delivery Method: Circle system utilized Preoxygenation: Pre-oxygenation with 100% oxygen Induction Type: IV induction and Cricoid Pressure applied Ventilation: Mask ventilation without difficulty Laryngoscope Size: Mac and 4 Grade View: Grade I Tube type: Oral Tube size: 7.0 mm Number of attempts: 1 Airway Equipment and Method: Stylet Placement Confirmation: ETT inserted through vocal cords under direct vision,  positive ETCO2 and breath sounds checked- equal and bilateral Secured at: 24 cm Tube secured with: Tape Dental Injury: Teeth and Oropharynx as per pre-operative assessment

## 2018-04-18 NOTE — ED Triage Notes (Signed)
Pt reports "catheter is blocked." Pt states last urine was around 0000.

## 2018-04-19 LAB — URINE CULTURE
Culture: <10000 — AB
Special Requests: NORMAL

## 2018-04-19 NOTE — Op Note (Signed)
Procedure: Cystoscopy and exchange of suprapubic tube.  Preop diagnosis: Urinary retention with supra pubic tube associated pain.  Postop diagnosis: Same.  Surgeon: Dr. Irine Seal.  Anesthesia: General.  Drain: 66 French Foley suprapubic tube.  EBL: None.  Complications: None.  Indications: Jonathan [name] is a 78 year old male with urinary retention following radiation therapy for prostate cancer that is currently being managed with a suprapubic tube.  His tube was last changed in early December and he presented to the office today complaining of extreme bladder spasms despite recent irrigation.  He was very tearful and agitated and unwilling to have the tube exchanged in the office.  I felt exchange in the OR with cystoscopy to assess his outlet was indicated.  Procedure: He was given Cipro 400 mg IV.  He was taken operating room where general anesthetic was induced.  He was placed in the supine position and fitted with PAS hose.  His prior suprapubic tube was removed by cutting the catheter allowing it to uncoil.  It came out without resistance or evidence of encrustation.  The lower abdomen and genitalia was prepped with Betadine solution and he was draped in usual sterile fashion.  Cystoscopy was performed using a flexible cystoscope.  Examination revealed a normal urethra.  The external sphincter was intact.  The prostatic urethra was approximately 3 cm in length with what appeared to be a nonobstructing lateral lobe hyperplasia.  There was no middle lobe.  Inspection of the bladder demonstrated inflammatory edema on the posterior wall it was felt to be secondary to suprapubic tube irritation.  Bladder had mild trabeculation.  No tumors were seen.  No stones were seen.  Ureteral orifice ease were unremarkable.  An initial attempt was made to pass an 61 French Foley catheter, but the tract was too narrow to accept that size Foley.  A 16 French Foley catheter was then inserted into the bladder with  confirmation of the position cystoscopically.  The balloon was filled with 10 mL of sterile fluid.  The catheter was placed to straight drainage and the bladder was drained.  His anesthetic was reversed and he was moved recovery in stable condition.  There were no complications.

## 2018-04-21 ENCOUNTER — Encounter (HOSPITAL_COMMUNITY): Payer: Self-pay | Admitting: Urology

## 2018-04-21 DIAGNOSIS — I890 Lymphedema, not elsewhere classified: Secondary | ICD-10-CM | POA: Diagnosis not present

## 2018-04-21 DIAGNOSIS — J069 Acute upper respiratory infection, unspecified: Secondary | ICD-10-CM | POA: Diagnosis not present

## 2018-04-21 DIAGNOSIS — Z6828 Body mass index (BMI) 28.0-28.9, adult: Secondary | ICD-10-CM | POA: Diagnosis not present

## 2018-04-29 DIAGNOSIS — J439 Emphysema, unspecified: Secondary | ICD-10-CM | POA: Diagnosis not present

## 2018-04-29 DIAGNOSIS — Z9889 Other specified postprocedural states: Secondary | ICD-10-CM | POA: Diagnosis not present

## 2018-04-29 DIAGNOSIS — R600 Localized edema: Secondary | ICD-10-CM | POA: Diagnosis not present

## 2018-04-29 DIAGNOSIS — R05 Cough: Secondary | ICD-10-CM | POA: Diagnosis not present

## 2018-04-29 DIAGNOSIS — Z923 Personal history of irradiation: Secondary | ICD-10-CM | POA: Diagnosis not present

## 2018-05-23 ENCOUNTER — Ambulatory Visit (INDEPENDENT_AMBULATORY_CARE_PROVIDER_SITE_OTHER): Payer: Medicare Other | Admitting: Urology

## 2018-05-23 DIAGNOSIS — R338 Other retention of urine: Secondary | ICD-10-CM | POA: Diagnosis not present

## 2018-05-24 ENCOUNTER — Other Ambulatory Visit: Payer: Self-pay

## 2018-05-24 ENCOUNTER — Emergency Department (HOSPITAL_COMMUNITY): Payer: Medicare Other

## 2018-05-24 ENCOUNTER — Emergency Department (HOSPITAL_COMMUNITY)
Admission: EM | Admit: 2018-05-24 | Discharge: 2018-05-24 | Disposition: A | Discharge status: Home / Self Care | Payer: Medicare Other | Attending: Emergency Medicine | Admitting: Emergency Medicine

## 2018-05-24 ENCOUNTER — Encounter (HOSPITAL_COMMUNITY): Payer: Self-pay | Admitting: Emergency Medicine

## 2018-05-24 DIAGNOSIS — Z79899 Other long term (current) drug therapy: Secondary | ICD-10-CM | POA: Diagnosis not present

## 2018-05-24 DIAGNOSIS — Y9289 Other specified places as the place of occurrence of the external cause: Secondary | ICD-10-CM | POA: Insufficient documentation

## 2018-05-24 DIAGNOSIS — S99912A Unspecified injury of left ankle, initial encounter: Secondary | ICD-10-CM | POA: Diagnosis present

## 2018-05-24 DIAGNOSIS — E119 Type 2 diabetes mellitus without complications: Secondary | ICD-10-CM | POA: Insufficient documentation

## 2018-05-24 DIAGNOSIS — Y998 Other external cause status: Secondary | ICD-10-CM | POA: Insufficient documentation

## 2018-05-24 DIAGNOSIS — X500XXA Overexertion from strenuous movement or load, initial encounter: Secondary | ICD-10-CM | POA: Diagnosis not present

## 2018-05-24 DIAGNOSIS — I10 Essential (primary) hypertension: Secondary | ICD-10-CM | POA: Diagnosis not present

## 2018-05-24 DIAGNOSIS — Z8546 Personal history of malignant neoplasm of prostate: Secondary | ICD-10-CM | POA: Insufficient documentation

## 2018-05-24 DIAGNOSIS — M7989 Other specified soft tissue disorders: Secondary | ICD-10-CM | POA: Diagnosis not present

## 2018-05-24 DIAGNOSIS — I4891 Unspecified atrial fibrillation: Secondary | ICD-10-CM | POA: Diagnosis not present

## 2018-05-24 DIAGNOSIS — Z87891 Personal history of nicotine dependence: Secondary | ICD-10-CM | POA: Diagnosis not present

## 2018-05-24 DIAGNOSIS — S93402A Sprain of unspecified ligament of left ankle, initial encounter: Secondary | ICD-10-CM | POA: Diagnosis not present

## 2018-05-24 DIAGNOSIS — M25572 Pain in left ankle and joints of left foot: Secondary | ICD-10-CM | POA: Diagnosis not present

## 2018-05-24 DIAGNOSIS — Y9301 Activity, walking, marching and hiking: Secondary | ICD-10-CM | POA: Diagnosis not present

## 2018-05-24 NOTE — ED Provider Notes (Signed)
[facility] EMERGENCY DEPARTMENT Provider Note   CSN: [id number] Arrival date & time: 05/24/18  0909    History   Chief Complaint Chief Complaint  Patient presents with  . Ankle Pain    HPI Jonathan [name] is a 78 y.o. male.     HPI   Jonathan [name] is a 78 y.o. male with history of diabetic polyneuropathy, A. fib, and prostate in lung cancer currently undergoing ration therapy, who presents to the Emergency Department complaining of pain to the left ankle.  Symptoms have been present for 2 weeks.  He states that he was walking several days ago and felt a "pop" in his ankle.  Pain is been increasing since that time.  He states the pain is worse with weightbearing and with flexion of his foot.  He reports swelling around his lateral ankle.  He denies known fall, numbness or tingling of his foot or pain radiating into his leg, rash, or open wounds or sores.  He states that he takes narcotic pain medicine daily which is provided minimal relief of his ankle pain.     Past Medical History:  Diagnosis Date  . Acquired bladder diverticulum    12/ 2012  resection diverticulum done at [facility] in [place], [place]  . Age-related cataract of right eye    scheduled for catarat extraction 09-12-2017  . Agent orange exposure   . Aneurysm of infrarenal abdominal aorta ([place])    first dx 2017--- last CT 06-11-2017  measures 3.5cm  . Chronic constipation   . COPD with emphysema ([place])    06--12-2017  per pt no symptoms since Feb 2019  . Depression   . Diabetes mellitus type 2, diet-controlled ([place])   . Diabetic neuropathy ([place])   . Dyslipidemia   . Dyspnea    increased exertion; fumes  . Dyspnea on exertion   . Erectile dysfunction   . Feeling of incomplete bladder emptying   . GERD (gastroesophageal reflux disease)   . Gout    09-02-2017 last flare up 3 months ago  . Hiatal hernia   . History of atrial fibrillation    episode post op right lung lobectomy 07-04-2015  . History of  bladder stone   . History of colon polyps   . History of DVT of lower extremity    03/ 2019  bilateral lower extremity (superficial) ---  per treated w/ oral medication   . History of gastritis   . History of kidney stones   . History of radiation therapy 09-14-2015  to 09-21-2015   left upper lung nodule -- 54Gy in 3 fractions (18 Gy per fraction)  . Hyperplasia of prostate with lower urinary tract symptoms (LUTS)   . Hypertension   . Lumbar spinal stenosis   . Nephrolithiasis   . Neurogenic bladder   . Osteoarthritis    ankle, hands  . Osteoporosis   . Prostate cancer [facility]) urologist-  dr wrenn/  oncologist-  dr Tammi Klippel   dx 05-24-2017-- Stage T2b,  Gleason 4+4,  PSA 22.41,  vol 38cc--- started ADT 04/ 2019,  plan external radiation therapy  . Radiation fibrosis of lung ([place])    hx left upper long nodule SBRT 06/ 2017  . Squamous cell carcinoma of both lungs ([place]) last CT in epic dated 06-26-2017 no recurrence   dx 03/ 2017  non-small cell SCC via bronchoscopy w/ bx's by dr Roxan Hockey---  s/p  right VATS w/ right lobectomy and node dissection's 07-04-2015 /  pt  had SBRT to left upper nodule Stage 1 ([facility]) completed 09-21-2015  . Wears dentures    bottom  . Wears glasses     Patient Active Problem List   Diagnosis Date Noted  . Urinary retention 04/18/2018  . Malignant neoplasm of prostate ([place]) 07/10/2017  . Arthritis of right subtalar joint 04/18/2017  . 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 Idiopathic chronic venous hypertension of both lower extremities with inflammation 05/22/2016  . Fibrosis of subtalar joint, right 03/08/2016  . Primary malignant neoplasm of left upper lobe of lung ([place]) 09/21/2015  . Lung cancer ([place]) 07/04/2015  . Diabetes mellitus type II, controlled ([place]) 06/01/2015  . 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 Neoplasm of uncertain behavior of left upper lobe of lung 05/31/2015    . Emphysema of lung ([place])   . Kidney stones   . Osteoporosis   . H/O total ankle replacement, right 04/20/2015  . Carpal tunnel syndrome - right 07/09/2014  . Abdominal bloating 06/14/2014  . History of colonic polyps 06/14/2014  . Diabetic polyneuropathy associated with type 2 diabetes mellitus ([place]) 12/05/2012  . Lesion of ulnar nerve 12/05/2012  . Syncope 11/15/2012  . Gout 11/15/2012  . Constipation 04/02/2009  . CYSTITIS, RECURRENT 12/02/2008  . COPD 10/26/2008  . DIVERTICULUM, BLADDER 10/11/2008  . INSOMNIA 07/24/2008  . ACUTE CYSTITIS 07/20/2008  . FATIGUE 07/20/2008  . DEPRESSION 02/27/2008  . CHEST PAIN UNSPECIFIED 02/27/2008  . NICOTINE ADDICTION 08/12/2007  . DYSLIPIDEMIA 07/22/2007  . OBESITY 07/22/2007  . ERECTILE DYSFUNCTION 07/22/2007  . HYPERTENSION 07/22/2007    Past Surgical History:  Procedure Laterality Date  . CARPAL TUNNEL RELEASE Right 07/09/2014   Procedure: RIGHT OPEN CARPAL TUNNEL RELEASE;  Surgeon: Mcarthur Rossetti, MD;  Location: [facility];  Service: Orthopedics;  Laterality: Right;  . CHOLECYSTECTOMY N/A 02/24/2014   Procedure: LAPAROSCOPIC CHOLECYSTECTOMY;  Surgeon: Coralie Keens, MD;  Location: [place];  Service: General;  Laterality: N/A;  . COLONOSCOPY    . CYSTOLITHOTOMY  11/ [facility]      [facility] [place]  . CYSTOSCOPY N/A 04/18/2018   Procedure: CYSTOSCOPY FLEXIBLE;  Surgeon: Irine Seal, MD;  Location: [facility];  Service: Urology;  Laterality: N/A;  . CYSTOSCOPY W/ LITHOLAPAXY / EHL  02-24-2007   dr Janice Norrie  [facility]  . GOLD SEED IMPLANT N/A 09/05/2017   Procedure: GOLD SEED IMPLANT;  Surgeon: Irine Seal, MD;  Location: [facility];  Service: Urology;  Laterality: N/A;  . INSERTION OF SUPRAPUBIC CATHETER N/A 04/18/2018   Procedure: SUPRAPUBIC TUBE CHANGE;  Surgeon: Irine Seal, MD;  Location: [facility];  Service: Urology;  Laterality: N/A;  . IR CATHETER TUBE CHANGE  02/24/2018  . SPACE OAR INSTILLATION N/A 09/05/2017    Procedure: SPACE OAR INSTILLATION;  Surgeon: Irine Seal, MD;  Location: [facility];  Service: Urology;  Laterality: N/A;  . TOTAL ANKLE ARTHROPLASTY Right 04/20/2015   Procedure: TOTAL ANKLE ARTHOPLASTY;  Surgeon: Newt Minion, MD;  Location: [place];  Service: Orthopedics;  Laterality: Right;  . TRANSTHORACIC ECHOCARDIOGRAM  11/16/2012   ef 55-60%,  grade 1 diastolic dysfunction/  mild dilated ascending aorta/  mild MR/ trivial TR  . VIDEO ASSISTED THORACOSCOPY (VATS)/ LOBECTOMY Right 07/04/2015   Procedure: VIDEO ASSISTED THORACOSCOPY (VATS)/ RIGHT UPPER LOBECTOMY;  Surgeon: Melrose Nakayama, MD;  Location: [place];  Service: Thoracic;  Laterality: Right;  [name] VIDEO BRONCHOSCOPY N/A  06/17/2015   Procedure: VIDEO BRONCHOSCOPY;  Surgeon: Melrose Nakayama, MD;  Location: [place];  Service: Thoracic;  Laterality: N/A;  . VIDEO BRONCHOSCOPY WITH ENDOBRONCHIAL NAVIGATION N/A 06/17/2015   Procedure: VIDEO BRONCHOSCOPY WITH ENDOBRONCHIAL NAVIGATION;  Surgeon: Melrose Nakayama, MD;  Location: [place];  Service: Thoracic;  Laterality: N/A;  . VIDEO BRONCHOSCOPY WITH ENDOBRONCHIAL ULTRASOUND N/A 06/17/2015   Procedure: VIDEO BRONCHOSCOPY WITH ENDOBRONCHIAL ULTRASOUND;  Surgeon: Melrose Nakayama, MD;  Location: [place];  Service: Thoracic;  Laterality: N/A;        Home Medications    Prior to Admission medications   Medication Sig Start Date End Date Taking? Authorizing Provider  atenolol (TENORMIN) 25 MG tablet Take 1 tablet (25 mg total) by mouth at bedtime. Patient not taking: Reported on 04/06/2018 07/14/15   Nani Skillern, PA-C  atenolol-chlorthalidone (TENORETIC) 50-25 MG tablet Take 1 tablet by mouth daily.    [provider]  belladonna-opium (B&O SUPPRETTES) 16.2-30 MG suppository Place 1 suppository (30 mg total) rectally 2 (two) times daily as needed for pain (rectal spasms). Patient not taking: Reported on 04/06/2018 12/21/17   Duffy Bruce, MD   gabapentin (NEURONTIN) 400 MG capsule Take 400 mg by mouth 2 (two) times daily.    [provider]  ketorolac (ACULAR) 0.4 % SOLN Place 1 drop into the left eye 4 (four) times daily. 11/28/17   [provider]  LANTUS SOLOSTAR 100 UNIT/ML Solostar Pen Inject 5 Units into the skin daily.  11/28/17   [provider]  LINZESS 72 MCG capsule Take 72 mcg by mouth every other day.  10/03/17   [provider]  meloxicam (MOBIC) 15 MG tablet Take 15 mg by mouth daily.    [provider]  mirabegron ER (MYRBETRIQ) 50 MG TB24 tablet Take 50 mg by mouth daily.    [provider]  nicotine polacrilex (NICORETTE) 4 MG gum Take 4 mg by mouth as needed for smoking cessation.    [provider]  nitrofurantoin (MACRODANTIN) 100 MG capsule Take 100 mg by mouth 2 (two) times daily. 04/04/18   [provider]  ofloxacin (OCUFLOX) 0.3 % ophthalmic solution Place 1 drop into the left eye daily.  11/28/17   [provider]  Oxycodone HCl 20 MG TABS Take 10 mg by mouth 3 (three) times daily. 02/02/18   [provider]  prednisoLONE acetate (PRED FORTE) 1 % ophthalmic suspension Place 1 drop into both eyes daily.  11/28/17   [provider]  predniSONE (DELTASONE) 20 MG tablet Take 20-40 mg by mouth daily as needed (for gout symptoms).     [provider]  sildenafil (VIAGRA) 100 MG tablet Take 100 mg by mouth daily as needed for erectile dysfunction.     [provider]  SIMBRINZA 1-0.2 % SUSP Place 1 drop into both eyes daily.  09/24/17   [provider]  tamsulosin (FLOMAX) 0.4 MG CAPS capsule Take 0.4 mg by mouth daily after supper. Reported on 10/12/2015    [provider]  timolol (TIMOPTIC) 0.5 % ophthalmic solution Place 1 drop into both eyes daily. 01/09/18   [provider]    Family History Family History  Problem Relation Age of Onset  . Cancer Father        Asbestos  .  Colon cancer Neg Hx   . Colon polyps Neg Hx   . Kidney disease Neg Hx   . Esophageal cancer Neg Hx   . Gallbladder  disease Neg Hx   . Heart disease Neg Hx   . Diabetes Neg Hx     Social History Social History   Tobacco Use  . Smoking status: Former Smoker    Packs/day: 0.50    Years: 40.00    Pack years: 20.00    Types: Cigarettes    Last attempt to quit: 02/13/2015    Years since quitting: 3.2  . Smokeless tobacco: Never Used  Substance Use Topics  . Alcohol use: No    Alcohol/week: 0.0 standard drinks  . Drug use: No     Allergies   Lisinopril; Ciprofloxacin; and Levaquin [levofloxacin]   Review of Systems Review of Systems  Constitutional: Negative for chills and fever.  HENT: Negative for congestion and trouble swallowing.   Respiratory: Negative for cough, chest tightness and shortness of breath.   Cardiovascular: Negative for chest pain.  Gastrointestinal: Negative for abdominal pain, nausea and vomiting.  Genitourinary: Negative for flank pain.  Musculoskeletal: Positive for arthralgias (Left ankle pain and swelling). Negative for joint swelling, neck pain and neck stiffness.  Skin: Negative for color change and wound.  Neurological: Negative for dizziness, syncope, weakness, numbness and headaches.  Psychiatric/Behavioral: Negative for confusion.     Physical Exam Updated Vital Signs BP (!) 116/49 (BP Location: Right Arm)   Pulse 80   Temp 98.4 F (36.9 C) (Oral)   Resp 18   Ht 6\' 8"  (2.032 m)   Wt 113.9 kg   SpO2 96%   BMI 27.57 kg/m   Physical Exam Vitals signs and nursing note reviewed.  Constitutional:      General: He is not in acute distress.    Appearance: Normal appearance. He is not ill-appearing.  HENT:     Head: Atraumatic.     Mouth/Throat:     Mouth: Mucous membranes are moist.     Pharynx: No posterior oropharyngeal erythema.  Neck:     Musculoskeletal: Normal range of motion. No neck rigidity.  Cardiovascular:     Rate  and Rhythm: Normal rate and regular rhythm.     Pulses: Normal pulses.  Pulmonary:     Effort: Pulmonary effort is normal.     Breath sounds: Normal breath sounds.  Musculoskeletal:        General: Swelling and tenderness present.     Comments: Mild tenderness to palpation with mild edema of the medial and lateral left ankle.  No excessive warmth, rash, or erythema.  No tenderness or edema proximal to the ankle.  Compartments are soft.  Skin:    General: Skin is warm.     Capillary Refill: Capillary refill takes less than 2 seconds.     Findings: No erythema or rash.  Neurological:     Mental Status: He is alert. Mental status is at baseline.     Sensory: No sensory deficit.     Motor: No weakness.      ED Treatments / Results  Labs (all labs ordered are listed, but only abnormal results are displayed) Labs Reviewed - No data to display  EKG None  Radiology Dg Ankle Complete Left  Result Date: 05/24/2018 CLINICAL DATA:  Ankle pain following walking, initial encounter EXAM: LEFT ANKLE COMPLETE - 3+ VIEW COMPARISON:  None. FINDINGS: Generalized soft tissue swelling is noted about the ankle. Well corticated bony density is noted adjacent to the distal aspect of the lateral malleolus consistent with prior avulsion fracture. No acute fracture is seen. IMPRESSION: Soft tissue swelling without acute bony  abnormality. Electronically Signed   By: Inez Catalina M.D.   On: 05/24/2018 10:49    Procedures Procedures (including critical care time)  Medications Ordered in ED Medications - No data to display   Initial Impression / Assessment and Plan / ED Course  I have reviewed the triage vital signs and the nursing notes.  Pertinent labs & imaging results that were available during my care of the patient were reviewed by me and considered in my medical decision making (see chart for details).        Patient with symptoms localized to the left ankle.  X-ray is negative for acute  fracture although possible prior avulsion fracture noted.  Extremity is neurovascularly intact.  There is no tenderness proximal to the ankle.  No concerning symptoms for septic joint or cellulitis.  Pt findings and care plan discussed with Dr. Wilson Singer   ASO applied, patient requesting prescription for quad cane for ambulation.  He appears appropriate for discharge home and agrees to close follow-up with his orthopedic provider in 1 week if not improving.  Final Clinical Impressions(s) / ED Diagnoses   Final diagnoses:  Sprain of left ankle, unspecified ligament, initial encounter    ED Discharge Orders    None       Bufford Lope 05/24/18 1537    Virgel Manifold, MD 05/25/18 [phone]

## 2018-05-24 NOTE — ED Triage Notes (Signed)
Patient c/o left ankle pain. Per patient felt a pop in ankle while walking 3 days ago. Per patient pain and swelling increasing. Patient has ankle wrapped.

## 2018-05-24 NOTE — Discharge Instructions (Addendum)
You may try wearing the ankle brace as needed for support when weightbearing.  Follow-up with your primary doctor or with your orthopedist in a few days for recheck.

## 2018-05-26 DIAGNOSIS — E1142 Type 2 diabetes mellitus with diabetic polyneuropathy: Secondary | ICD-10-CM | POA: Diagnosis not present

## 2018-05-26 DIAGNOSIS — B351 Tinea unguium: Secondary | ICD-10-CM | POA: Diagnosis not present

## 2018-05-26 DIAGNOSIS — M79672 Pain in left foot: Secondary | ICD-10-CM | POA: Diagnosis not present

## 2018-05-26 DIAGNOSIS — S93402A Sprain of unspecified ligament of left ankle, initial encounter: Secondary | ICD-10-CM | POA: Diagnosis not present

## 2018-05-26 DIAGNOSIS — E1351 Other specified diabetes mellitus with diabetic peripheral angiopathy without gangrene: Secondary | ICD-10-CM | POA: Diagnosis not present

## 2018-05-26 DIAGNOSIS — R2689 Other abnormalities of gait and mobility: Secondary | ICD-10-CM | POA: Diagnosis not present

## 2018-05-26 DIAGNOSIS — M79671 Pain in right foot: Secondary | ICD-10-CM | POA: Diagnosis not present

## 2018-05-29 DIAGNOSIS — M25571 Pain in right ankle and joints of right foot: Secondary | ICD-10-CM | POA: Diagnosis not present

## 2018-05-29 DIAGNOSIS — E0865 Diabetes mellitus due to underlying condition with hyperglycemia: Secondary | ICD-10-CM | POA: Diagnosis not present

## 2018-05-29 DIAGNOSIS — C61 Malignant neoplasm of prostate: Secondary | ICD-10-CM | POA: Diagnosis not present

## 2018-05-29 DIAGNOSIS — E1143 Type 2 diabetes mellitus with diabetic autonomic (poly)neuropathy: Secondary | ICD-10-CM | POA: Diagnosis not present

## 2018-05-29 DIAGNOSIS — I10 Essential (primary) hypertension: Secondary | ICD-10-CM | POA: Diagnosis not present

## 2018-05-29 DIAGNOSIS — R1084 Generalized abdominal pain: Secondary | ICD-10-CM | POA: Diagnosis not present

## 2018-05-29 DIAGNOSIS — R413 Other amnesia: Secondary | ICD-10-CM | POA: Diagnosis not present

## 2018-05-29 DIAGNOSIS — R42 Dizziness and giddiness: Secondary | ICD-10-CM | POA: Diagnosis not present

## 2018-06-12 ENCOUNTER — Ambulatory Visit (INDEPENDENT_AMBULATORY_CARE_PROVIDER_SITE_OTHER): Payer: Medicare Other | Admitting: Orthopedic Surgery

## 2018-06-12 ENCOUNTER — Other Ambulatory Visit: Payer: Self-pay

## 2018-06-12 ENCOUNTER — Encounter (INDEPENDENT_AMBULATORY_CARE_PROVIDER_SITE_OTHER): Payer: Self-pay | Admitting: Orthopedic Surgery

## 2018-06-12 VITALS — Ht >= 80 in | Wt 251.0 lb

## 2018-06-12 DIAGNOSIS — Z9181 History of falling: Secondary | ICD-10-CM

## 2018-06-12 DIAGNOSIS — Z96661 Presence of right artificial ankle joint: Secondary | ICD-10-CM

## 2018-06-12 DIAGNOSIS — M25572 Pain in left ankle and joints of left foot: Secondary | ICD-10-CM

## 2018-06-12 NOTE — Progress Notes (Signed)
Office Visit Note   Patient: Jonathan [name]           Date of Birth: [birth year]           MRN: [id number] Visit Date: 06/12/2018              Requested by: Sasser, Silvestre Moment, MD [place], [place] [address] PCP: Manon Hilding, MD  Chief Complaint  Patient presents with  . Left Ankle - Follow-up    For evaluation for both lower extremities.  Patient complains of pain in the left ankle and foot which has developed since he has been limping and favoring his right total ankle.  He states the 2 are linked together.  Patient has tried an ankle stabilizing orthosis on the left and this is helped a little he states it does not help as well as the lace up leather support he is using for the right ankle.  Patient states he is currently under therapy for prostate cancer that was nonresectable he states he has a suprapubic catheter.  HPI: Patient is a 78 year old gentleman who presents  Assessment & Plan: Visit Diagnoses:  1. H/O total ankle replacement, right   2. Pain in left ankle and joints of left foot     Plan: Patient was given a prescription for biotech for a leather lace up ankle foot orthosis on the left.  Patient's symptoms in the left foot and ankle are linked to the right lower extremity.  Follow-Up Instructions: Return if symptoms worsen or fail to improve.   Ortho Exam  Patient is alert, oriented, no adenopathy, well-dressed, normal affect, normal respiratory effort. Examination patient has ulnar nerve neuropathy and weakness in the right upper extremity.  Patient has a plantigrade foot on the right with stable total ankle arthroplasty his foot is plantigrade.  Examination of the left foot and ankle he has generalized tenderness to palpation around the hindfoot and ankle.  His ankle is stable.  He has pain with weightbearing and does have a limp.  He is unsteady with his gait and states that he has had episodes of falling.  Imaging: No results found. No images are  attached to the encounter.  Labs: Lab Results  Component Value Date   HGBA1C 6.3 (H) 06/13/2015   HGBA1C 6.6 (H) 04/18/2015   HGBA1C 6.0 (H) 11/15/2012   REPTSTATUS 04/19/2018 FINAL 04/17/2018   GRAMSTAIN  06/17/2015    NO WBC SEEN NO SQUAMOUS EPITHELIAL CELLS SEEN NO ORGANISMS SEEN Performed at [facility] <10,000 COLONIES/mL INSIGNIFICANT GROWTH (A) 04/17/2018   LABORGA PSEUDOMONAS AERUGINOSA (A) 12/21/2017     Lab Results  Component Value Date   ALBUMIN 3.4 (L) 12/21/2017   ALBUMIN 3.8 12/10/2017   ALBUMIN 3.6 10/25/2016    Body mass index is 27.57 kg/m.  Orders:  No orders of the defined types were placed in this encounter.  No orders of the defined types were placed in this encounter.    Procedures: No procedures performed  Clinical Data: No additional findings.  ROS:  All other systems negative, except as noted in the HPI. Review of Systems  Objective: Vital Signs: Ht 6\' 8"  (2.032 m)   Wt 251 lb (113.9 kg)   BMI 27.57 kg/m   Specialty Comments:  No specialty comments available.  PMFS History: Patient Active Problem List   Diagnosis Date Noted  . Urinary retention 04/18/2018  . Malignant neoplasm of prostate ([place]) 07/10/2017  .  Arthritis of right subtalar joint 04/18/2017  . 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 Idiopathic chronic venous hypertension of both lower extremities with inflammation 05/22/2016  . Fibrosis of subtalar joint, right 03/08/2016  . Primary malignant neoplasm of left upper lobe of lung ([place]) 09/21/2015  . Lung cancer ([place]) 07/04/2015  . Diabetes mellitus type II, controlled ([place]) 06/01/2015  . 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 Emphysema of lung ([place])   . Kidney stones   . Osteoporosis   . H/O total ankle replacement, right 04/20/2015  . Carpal tunnel syndrome -  right 07/09/2014  . Abdominal bloating 06/14/2014  . History of colonic polyps 06/14/2014  . Diabetic polyneuropathy associated with type 2 diabetes mellitus ([place]) 12/05/2012  . Lesion of ulnar nerve 12/05/2012  . Syncope 11/15/2012  . Gout 11/15/2012  . Constipation 04/02/2009  . CYSTITIS, RECURRENT 12/02/2008  . COPD 10/26/2008  . DIVERTICULUM, BLADDER 10/11/2008  . INSOMNIA 07/24/2008  . ACUTE CYSTITIS 07/20/2008  . FATIGUE 07/20/2008  . DEPRESSION 02/27/2008  . CHEST PAIN UNSPECIFIED 02/27/2008  . NICOTINE ADDICTION 08/12/2007  . DYSLIPIDEMIA 07/22/2007  . OBESITY 07/22/2007  . ERECTILE DYSFUNCTION 07/22/2007  . HYPERTENSION 07/22/2007   Past Medical History:  Diagnosis Date  . Acquired bladder diverticulum    12/ 2012  resection diverticulum done at [facility] in [place], [place]  . Age-related cataract of right eye    scheduled for catarat extraction 09-12-2017  . Agent orange exposure   . Aneurysm of infrarenal abdominal aorta ([place])    first dx 2017--- last CT 06-11-2017  measures 3.5cm  . Chronic constipation   . COPD with emphysema ([place])    06--12-2017  per pt no symptoms since Feb 2019  . Depression   . Diabetes mellitus type 2, diet-controlled ([place])   . Diabetic neuropathy ([place])   . Dyslipidemia   . Dyspnea    increased exertion; fumes  . Dyspnea on exertion   . Erectile dysfunction   . Feeling of incomplete bladder emptying   . GERD (gastroesophageal reflux disease)   . Gout    09-02-2017 last flare up 3 months ago  . Hiatal hernia   . History of atrial fibrillation    episode post op right lung lobectomy 07-04-2015  . History of bladder stone   . History of colon polyps   . History of DVT of lower extremity    03/ 2019  bilateral lower extremity (superficial) ---  per treated w/ oral medication   . History of gastritis   . History of kidney stones   . History of radiation therapy 09-14-2015  to 09-21-2015   left upper lung nodule -- 54Gy in 3 fractions (18  Gy per fraction)  . Hyperplasia of prostate with lower urinary tract symptoms (LUTS)   . Hypertension   . Lumbar spinal stenosis   . Nephrolithiasis   . Neurogenic bladder   . Osteoarthritis    ankle, hands  . Osteoporosis   . Prostate cancer [facility]) urologist-  dr wrenn/  oncologist-  dr Tammi Klippel   dx 05-24-2017-- Stage T2b,  Gleason 4+4,  PSA 22.41,  vol 38cc--- started ADT 04/ 2019,  plan external radiation therapy  . Radiation fibrosis of lung ([place])    hx left upper long nodule SBRT 06/ 2017  . Squamous cell carcinoma of both lungs ([place]) last CT in  epic dated 06-26-2017 no recurrence   dx 03/ 2017  non-small cell SCC via bronchoscopy w/ bx's by dr Roxan Hockey---  s/p  right VATS w/ right lobectomy and node dissection's 07-04-2015 /  pt had SBRT to left upper nodule Stage 1 ([facility]) completed 09-21-2015  . Wears dentures    bottom  . Wears glasses     Family History  Problem Relation Age of Onset  . Cancer Father        Asbestos  . Colon cancer Neg Hx   . Colon polyps Neg Hx   . Kidney disease Neg Hx   . Esophageal cancer Neg Hx   . Gallbladder disease Neg Hx   . Heart disease Neg Hx   . Diabetes Neg Hx     Past Surgical History:  Procedure Laterality Date  . CARPAL TUNNEL RELEASE Right 07/09/2014   Procedure: RIGHT OPEN CARPAL TUNNEL RELEASE;  Surgeon: Mcarthur Rossetti, MD;  [facility];  Service: Orthopedics;  Laterality: Right;  . CHOLECYSTECTOMY N/A 02/24/2014   Procedure: LAPAROSCOPIC CHOLECYSTECTOMY;  Surgeon: Coralie Keens, MD;  Location: [facility];  Service: General;  Laterality: N/A;  . COLONOSCOPY    . [facility] [place]  . CYSTOSCOPY N/A 04/18/2018   Procedure: CYSTOSCOPY FLEXIBLE;  Surgeon: Irine Seal, MD;  [facility];  Service: Urology;  Laterality: N/A;  . CYSTOSCOPY W/ LITHOLAPAXY / EHL  02-24-2007   dr Janice Norrie  [facility]  . GOLD SEED IMPLANT N/A 09/05/2017   Procedure: GOLD SEED IMPLANT;   Surgeon: Irine Seal, MD;  [facility];  Service: Urology;  Laterality: N/A;  . INSERTION OF SUPRAPUBIC CATHETER N/A 04/18/2018   Procedure: SUPRAPUBIC TUBE CHANGE;  Surgeon: Irine Seal, MD;  [facility];  Service: Urology;  Laterality: N/A;  . IR CATHETER TUBE CHANGE  02/24/2018  . SPACE OAR INSTILLATION N/A 09/05/2017   Procedure: SPACE OAR INSTILLATION;  Surgeon: Irine Seal, MD;  [facility];  Service: Urology;  Laterality: N/A;  . TOTAL ANKLE ARTHROPLASTY Right 04/20/2015   Procedure: TOTAL ANKLE ARTHOPLASTY;  Surgeon: Newt Minion, MD;  Location: [facility];  Service: Orthopedics;  Laterality: Right;  . TRANSTHORACIC ECHOCARDIOGRAM  11/16/2012   ef 55-60%,  grade 1 diastolic dysfunction/  mild dilated ascending aorta/  mild MR/ trivial TR  . VIDEO ASSISTED THORACOSCOPY (VATS)/ LOBECTOMY Right 07/04/2015   Procedure: VIDEO ASSISTED THORACOSCOPY (VATS)/ RIGHT UPPER LOBECTOMY;  Surgeon: Melrose Nakayama, MD;  Location: [facility];  Service: Thoracic;  Laterality: Right;  [name] VIDEO BRONCHOSCOPY N/A 06/17/2015   Procedure: VIDEO BRONCHOSCOPY;  Surgeon: Melrose Nakayama, MD;  Location: [facility];  Service: Thoracic;  Laterality: N/A;  . VIDEO BRONCHOSCOPY WITH ENDOBRONCHIAL NAVIGATION N/A 06/17/2015   Procedure: VIDEO BRONCHOSCOPY WITH ENDOBRONCHIAL NAVIGATION;  Surgeon: Melrose Nakayama, MD;  Location: [facility];  Service: Thoracic;  Laterality: N/A;  . VIDEO BRONCHOSCOPY WITH ENDOBRONCHIAL ULTRASOUND N/A 06/17/2015   Procedure: VIDEO BRONCHOSCOPY WITH ENDOBRONCHIAL ULTRASOUND;  Surgeon: Melrose Nakayama, MD;  Location: [facility];  Service: Thoracic;  Laterality: N/A;   Social History   Occupational History  . Occupation: retired  Tobacco Use  . Smoking status: Former Smoker    Packs/day: 0.50    Years: 40.00    Pack years: 20.00    Types: Cigarettes    Last attempt to quit: 02/13/2015    Years since quitting: 3.3  . Smokeless  tobacco: Never  Used  Substance and Sexual Activity  . Alcohol use: No    Alcohol/week: 0.0 standard drinks  . Drug use: No  . Sexual activity: Not on file

## 2018-06-18 DIAGNOSIS — N319 Neuromuscular dysfunction of bladder, unspecified: Secondary | ICD-10-CM | POA: Diagnosis not present

## 2018-06-19 DIAGNOSIS — G5621 Lesion of ulnar nerve, right upper limb: Secondary | ICD-10-CM | POA: Diagnosis not present

## 2018-06-19 DIAGNOSIS — C61 Malignant neoplasm of prostate: Secondary | ICD-10-CM | POA: Diagnosis not present

## 2018-06-19 DIAGNOSIS — I10 Essential (primary) hypertension: Secondary | ICD-10-CM | POA: Diagnosis not present

## 2018-06-19 DIAGNOSIS — S93402A Sprain of unspecified ligament of left ankle, initial encounter: Secondary | ICD-10-CM | POA: Diagnosis not present

## 2018-06-19 DIAGNOSIS — R42 Dizziness and giddiness: Secondary | ICD-10-CM | POA: Diagnosis not present

## 2018-06-19 DIAGNOSIS — K5901 Slow transit constipation: Secondary | ICD-10-CM | POA: Diagnosis not present

## 2018-06-19 DIAGNOSIS — E0865 Diabetes mellitus due to underlying condition with hyperglycemia: Secondary | ICD-10-CM | POA: Diagnosis not present

## 2018-06-19 DIAGNOSIS — Z0001 Encounter for general adult medical examination with abnormal findings: Secondary | ICD-10-CM | POA: Diagnosis not present

## 2018-06-23 ENCOUNTER — Ambulatory Visit (INDEPENDENT_AMBULATORY_CARE_PROVIDER_SITE_OTHER): Payer: Medicare Other | Admitting: Orthopaedic Surgery

## 2018-06-25 ENCOUNTER — Telehealth: Payer: Self-pay | Admitting: *Deleted

## 2018-06-25 NOTE — Telephone Encounter (Signed)
Pt verbally consented to telehealth appt with Dr Harl Bowie. Pt has BP cuff at home. Aware that we would call to review medications prior to appt. Pt would like to be called on Monday

## 2018-06-30 ENCOUNTER — Telehealth: Payer: Self-pay | Admitting: *Deleted

## 2018-06-30 NOTE — Telephone Encounter (Signed)
Pt contacted for 4/7 appt telehealth telephone appt with Dr Harl Bowie. Reviewed pt medications/allergies/pharamcy. Pt will have BP ready for tomorrow

## 2018-07-01 ENCOUNTER — Encounter

## 2018-07-01 ENCOUNTER — Encounter: Payer: Self-pay | Admitting: Cardiology

## 2018-07-01 ENCOUNTER — Telehealth (INDEPENDENT_AMBULATORY_CARE_PROVIDER_SITE_OTHER): Payer: Medicare Other | Admitting: Cardiology

## 2018-07-01 VITALS — BP 116/80 | HR 78 | Ht >= 80 in | Wt 253.0 lb

## 2018-07-01 DIAGNOSIS — R42 Dizziness and giddiness: Secondary | ICD-10-CM

## 2018-07-01 DIAGNOSIS — I10 Essential (primary) hypertension: Secondary | ICD-10-CM | POA: Diagnosis not present

## 2018-07-01 MED ORDER — ATENOLOL 50 MG PO TABS: 50.0000 mg | ORAL_TABLET | Freq: Every day | ORAL | 1 refills | Status: DC

## 2018-07-01 MED ORDER — CHLORTHALIDONE 25 MG PO TABS: 12.5000 mg | ORAL_TABLET | Freq: Every day | ORAL | 1 refills | Status: DC

## 2018-07-01 NOTE — Progress Notes (Signed)
Virtual Visit via Telephone Note   This visit type was conducted due to national recommendations for restrictions regarding the COVID-19 Pandemic (e.g. social distancing) in an effort to limit this patient's exposure and mitigate transmission in our community.  Due to his co-morbid illnesses, this patient is at least at moderate risk for complications without adequate follow up.  This format is felt to be most appropriate for this patient at this time.  The patient did not have access to video technology/had technical difficulties with video requiring transitioning to audio format only (telephone).  All issues noted in this document were discussed and addressed.  No physical exam could be performed with this format.  Please refer to the patient's chart for his  consent to telehealth for [facility].   Evaluation Performed:  Follow-up visit  Date:  07/01/2018   ID:  Jonathan [name], Jonathan [name] February 27, 1941, MRN [id number]  Patient Location: Home  Provider Location: Office  PCP:  [name], PA-C  Cardiologist:  Dr Carlyle Dolly MD Electrophysiologist:  None   Chief Complaint:  Dizziness  History of Present Illness:    Jonathan [name] is a 78 y.o. male who presents via audio/video conferencing for a telehealth visit today.    Seen as new patient today for dizziness.    1. Dizziness - started about 3 years, worsening - mainly occurs with standing, feels weak in legs. Occurs 5-10 secs after standing - no syncope,has had some falls  - he is on chlorthalidone, flomax.  - no palpitations - home bp's typically around 120s/70s, HRs 70s - relatively poor oral hydration given he is 6'8 256 lbs. Drinks 2L of water. Drinks coffee x 1 cup, occasional soda, no EtoH.  - no issues with N/V/D  - 2006 echo normal LVEF        The patient does not have symptoms concerning for COVID-19 infection (fever, chills, cough, or new shortness of breath).    Past Medical History:  Diagnosis  Date  . Acquired bladder diverticulum    12/ 2012  resection diverticul[facility] done at [facility] in [place], [place]  . Age-related cataract of right eye    scheduled for catarat extraction 09-12-2017  . Agent orange exposure   . Aneurysm of infrarenal abdominal aorta ([place])    first dx 2017--- last CT 06-11-2017  measures 3.5cm  . Chronic constipation   . COPD with emphysema ([place])    06--12-2017  per pt no symptoms since Feb 2019  . Depression   . Diabetes mellitus type 2, diet-controlled ([place])   . Diabetic neuropathy ([place])   . Dyslipidemia   . Dyspnea    increased exertion; fumes  . Dyspnea on exertion   . Erectile dysfunction   . Feeling of incomplete bladder emptying   . GERD (gastroesophageal reflux disease)   . Gout    09-02-2017 last flare up 3 months ago  . Hiatal hernia   . History of atrial fibrillation    episode post op right lung lobectomy 07-04-2015  . History of bladder stone   . History of colon polyps   . History of DVT of lower extremity    03/ 2019  bilateral lower extremity (superficial) ---  per treated w/ oral medication   . History of gastritis   . History of kidney stones   . History of radiation therapy 09-14-2015  to 09-21-2015   left upper lung nodule -- 54Gy in 3 fractions (18 Gy per fraction)  . Hyperplasia of prostate  with lower urinary tract symptoms (LUTS)   . Hypertension   . Lumbar spinal stenosis   . Nephrolithiasis   . Neurogenic bladder   . Osteoarthritis    ankle, hands  . Osteoporosis   . Prostate cancer [facility]) urologist-  dr wrenn/  oncologist-  dr Tammi Klippel   dx 05-24-2017-- Stage T2b,  Gleason 4+4,  PSA 22.41,  vol 38cc--- started ADT 04/ 2019,  plan external radiation therapy  . Radiation fibrosis of lung ([place])    hx left upper long nodule SBRT 06/ 2017  . Squamous cell carcinoma of both lungs ([place]) last CT in epic dated 06-26-2017 no recurrence   dx 03/ 2017  non-small cell SCC via bronchoscopy w/ bx's by dr Roxan Hockey---  s/p  right VATS w/  right lobectomy and node dissection's 07-04-2015 /  pt had SBRT to left upper nodule Stage 1 ([facility]) completed 09-21-2015  . Wears dentures    bottom  . Wears glasses    Past Surgical History:  Procedure Laterality Date  . CARPAL TUNNEL RELEASE Right 07/09/2014   Procedure: RIGHT OPEN CARPAL TUNNEL RELEASE;  Surgeon: Mcarthur Rossetti, MD;  [facility];  Service: Orthopedics;  Laterality: Right;  . CHOLECYSTECTOMY N/A 02/24/2014   Procedure: LAPAROSCOPIC CHOLECYSTECTOMY;  Surgeon: Coralie Keens, MD;  Location: [facility];  Service: General;  Laterality: N/A;  . COLONOSCOPY    . [facility] [place]  . CYSTOSCOPY N/A 04/18/2018   Procedure: CYSTOSCOPY FLEXIBLE;  Surgeon: Irine Seal, MD;  [facility];  Service: Urology;  Laterality: N/A;  . CYSTOSCOPY W/ LITHOLAPAXY / EHL  02-24-2007   dr Janice Norrie  [facility]  . GOLD SEED IMPLANT N/A 09/05/2017   Procedure: GOLD SEED IMPLANT;  Surgeon: Irine Seal, MD;  Location[facility];  Service: Urology;  Laterality: N/A;  . INSERTION OF SUPRAPUBIC CATHETER N/A 04/18/2018   Procedure: SUPRAPUBIC TUBE CHANGE;  Surgeon: Irine Seal, MD;  [facility];  Service: Urology;  Laterality: N/A;  . IR CATHETER TUBE CHANGE  02/24/2018  . SPACE OAR INSTILLATION N/A 09/05/2017   Procedure: SPACE OAR INSTILLATION;  Surgeon: Irine Seal, MD;  Location[facility];  Service: Urology;  Laterality: N/A;  . TOTAL ANKLE ARTHROPLASTY Right 04/20/2015   Procedure: TOTAL ANKLE ARTHOPLASTY;  Surgeon: Newt Minion, MD;  Location: [facility];  Service: Orthopedics;  Laterality: Right;  . TRANSTHORACIC ECHOCARDIOGRAM  11/16/2012   ef 55-60%,  grade 1 diastolic dysfunction/  mild dilated ascending aorta/  mild MR/ trivial TR  . VIDEO ASSISTED THORACOSCOPY (VATS)/ LOBECTOMY Right 07/04/2015   Procedure: VIDEO ASSISTED THORACOSCOPY (VATS)/ RIGHT UPPER LOBECTOMY;  Surgeon: Melrose Nakayama, MD;   Location: [place];  Service: Thoracic;  Laterality: Right;  [name] VIDEO BRONCHOSCOPY N/A 06/17/2015   Procedure: VIDEO BRONCHOSCOPY;  Surgeon: Melrose Nakayama, MD;  Location: [facility];  Service: Thoracic;  Laterality: N/A;  . VIDEO BRONCHOSCOPY WITH ENDOBRONCHIAL NAVIGATION N/A 06/17/2015   Procedure: VIDEO BRONCHOSCOPY WITH ENDOBRONCHIAL NAVIGATION;  Surgeon: Melrose Nakayama, MD;  Location: [place];  Service: Thoracic;  Laterality: N/A;  . VIDEO BRONCHOSCOPY WITH ENDOBRONCHIAL ULTRASOUND N/A 06/17/2015   Procedure: VIDEO BRONCHOSCOPY WITH ENDOBRONCHIAL ULTRASOUND;  Surgeon: Melrose Nakayama, MD;  Location: [facility];  Service: Thoracic;  Laterality: N/A;     No outpatient medications have been marked as taking for the 07/01/18 encounter (Appointment) with Arnoldo Lenis, MD.  Allergies:   Ciprofloxacin and Levaquin [levofloxacin]   Social History   Tobacco Use  . Smoking status: Former Smoker    Packs/day: 0.50    Years: 40.00    Pack years: 20.00    Types: Cigarettes    Last attempt to quit: 02/13/2015    Years since quitting: 3.3  . Smokeless tobacco: Never Used  Substance Use Topics  . Alcohol use: No    Alcohol/week: 0.0 standard drinks  . Drug use: No     Family Hx: The patient's family history includes Cancer in his father. There is no history of Colon cancer, Colon polyps, Kidney disease, Esophageal cancer, Gallbladder disease, Heart disease, or Diabetes.  ROS:   Please see the history of present illness.     All other systems reviewed and are negative.   Prior CV studies:   The following studies were reviewed today:  2014 echo Study Conclusions  - Left ventricle: The cavity size was normal. Systolic function was normal. The estimated ejection fraction was in the range of 55% to 60%. Wall motion was normal; there were no regional wall motion abnormalities. Doppler parameters are consistent with abnormal left ventricular relaxation (grade 1  diastolic dysfunction). - Mitral valve: Mild regurgitation. - Right ventricle: The cavity size was mildly dilated. Wall thickness was normal.    Labs/Other Tests and Data Reviewed:    EKG:  na  Recent Labs: 12/21/2017: ALT 14 04/06/2018: Hemoglobin 12.1; Platelets 171 04/18/2018: BUN 12; Creatinine, Ser 1.32; Potassium 3.7; Sodium 137   Recent Lipid Panel Lab Results  Component Value Date/Time   CHOL 154 11/16/2012 04:35 AM   TRIG 125 11/16/2012 04:35 AM   HDL 36 (L) 11/16/2012 04:35 AM   CHOLHDL 4.3 11/16/2012 04:35 AM   LDLCALC 93 11/16/2012 04:35 AM    Wt Readings from Last 3 Encounters:  06/12/18 251 lb (113.9 kg)  05/24/18 251 lb (113.9 kg)  04/18/18 253 lb (114.8 kg)     Objective:    Vital Signs:  There were no vitals taken for this visit.   Normal affect. Normal speech pattern. Sounds comfortable, in no distress  ASSESSMENT & PLAN:    1. Dizziness -symptoms strongly suggestive of orthostatic dizziness. Only occur with first standing. Relatively poor oral hydration. He is on chlorthaldone and flomax - increase oral hydration. Stop his atenolol/chlorthalidone combination, restart atenolol 50mg  tablet and chlorthalidone at lower dose of 12.5mg .    2. HTN - bp med changes as reported above, follow bp's. May tolerate higher bp's over time given age and orthostatic symptoms.     COVID-19 Education: The signs and symptoms of COVID-19 were discussed with the patient and how to seek care for testing (follow up with PCP or arrange E-visit).  The importance of social distancing was discussed today.  Time:   Today, I have spent 23 minutes with the patient with telehealth technology discussing the above problems.     Medication Adjustments/Labs and Tests Ordered: Current medicines are reviewed at length with the patient today.  Concerns regarding medicines are outlined above.  Tests Ordered: No orders of the defined types were placed in this encounter.   Medication Changes: No orders of the defined types were placed in this encounter.   Disposition:  Follow up 65months  Signed, Carlyle Dolly, MD  07/01/2018 9:29 AM    [facility]

## 2018-07-01 NOTE — Patient Instructions (Addendum)
Your physician recommends that you schedule a follow-up appointment in: [place] has recommended you make the following change in your medication  STOP ATENOLOL/CHLORTHALIDONE (TENORETIC)  START CHLORTHALIDONE (12.5 MG) 1/2 TABLET DAILY  START ATENOLOL 50 MG DAILY   INCREASE FLUID INTAKE (4-6 BOTTLES OF WATER DAILY)  Thank you for choosing [facility]!!

## 2018-07-07 DIAGNOSIS — C61 Malignant neoplasm of prostate: Secondary | ICD-10-CM | POA: Diagnosis not present

## 2018-07-07 DIAGNOSIS — R338 Other retention of urine: Secondary | ICD-10-CM | POA: Diagnosis not present

## 2018-07-14 DIAGNOSIS — R338 Other retention of urine: Secondary | ICD-10-CM | POA: Diagnosis not present

## 2018-07-14 DIAGNOSIS — R3914 Feeling of incomplete bladder emptying: Secondary | ICD-10-CM | POA: Diagnosis not present

## 2018-07-26 ENCOUNTER — Emergency Department (HOSPITAL_COMMUNITY)
Admission: EM | Admit: 2018-07-26 | Discharge: 2018-07-26 | Disposition: A | Discharge status: Home / Self Care | Payer: Medicare Other | Attending: Emergency Medicine | Admitting: Emergency Medicine

## 2018-07-26 ENCOUNTER — Encounter (HOSPITAL_COMMUNITY): Payer: Self-pay | Admitting: Emergency Medicine

## 2018-07-26 ENCOUNTER — Other Ambulatory Visit: Payer: Self-pay

## 2018-07-26 DIAGNOSIS — N3001 Acute cystitis with hematuria: Secondary | ICD-10-CM | POA: Insufficient documentation

## 2018-07-26 DIAGNOSIS — Z79899 Other long term (current) drug therapy: Secondary | ICD-10-CM | POA: Diagnosis not present

## 2018-07-26 DIAGNOSIS — Z87891 Personal history of nicotine dependence: Secondary | ICD-10-CM | POA: Insufficient documentation

## 2018-07-26 DIAGNOSIS — J449 Chronic obstructive pulmonary disease, unspecified: Secondary | ICD-10-CM | POA: Insufficient documentation

## 2018-07-26 DIAGNOSIS — E119 Type 2 diabetes mellitus without complications: Secondary | ICD-10-CM | POA: Diagnosis not present

## 2018-07-26 DIAGNOSIS — I10 Essential (primary) hypertension: Secondary | ICD-10-CM | POA: Insufficient documentation

## 2018-07-26 DIAGNOSIS — Z8546 Personal history of malignant neoplasm of prostate: Secondary | ICD-10-CM | POA: Insufficient documentation

## 2018-07-26 DIAGNOSIS — R319 Hematuria, unspecified: Secondary | ICD-10-CM | POA: Diagnosis present

## 2018-07-26 DIAGNOSIS — Z96651 Presence of right artificial knee joint: Secondary | ICD-10-CM | POA: Diagnosis not present

## 2018-07-26 LAB — URINALYSIS, ROUTINE W REFLEX MICROSCOPIC
Bilirubin Urine: NEGATIVE
Glucose, UA: NEGATIVE mg/dL
Ketones, ur: NEGATIVE mg/dL
Nitrite: POSITIVE — AB
Protein, ur: 30 mg/dL — AB
RBC / HPF: >50 RBC/hpf — ABNORMAL HIGH (ref 0–5)
Specific Gravity, Urine: 1.016 (ref 1.005–1.030)
pH: 6 (ref 5.0–8.0)

## 2018-07-26 LAB — CBC WITH DIFFERENTIAL/PLATELET
Abs Immature Granulocytes: 0.06 10*3/uL (ref 0.00–0.07)
Basophils Absolute: 0 10*3/uL (ref 0.0–0.1)
Basophils Relative: 0 %
Eosinophils Absolute: 0.2 10*3/uL (ref 0.0–0.5)
Eosinophils Relative: 3 %
HCT: 33.4 % — ABNORMAL LOW (ref 39.0–52.0)
Hemoglobin: 11.2 g/dL — ABNORMAL LOW (ref 13.0–17.0)
Immature Granulocytes: 1 %
Lymphocytes Relative: 22 %
Lymphs Abs: 1.5 10*3/uL (ref 0.7–4.0)
MCH: 31.2 pg (ref 26.0–34.0)
MCHC: 33.5 g/dL (ref 30.0–36.0)
MCV: 93 fL (ref 80.0–100.0)
Monocytes Absolute: 0.4 10*3/uL (ref 0.1–1.0)
Monocytes Relative: 6 %
Neutro Abs: 4.5 10*3/uL (ref 1.7–7.7)
Neutrophils Relative %: 68 %
Platelets: 157 10*3/uL (ref 150–400)
RBC: 3.59 MIL/uL — ABNORMAL LOW (ref 4.22–5.81)
RDW: 14.1 % (ref 11.5–15.5)
WBC: 6.6 10*3/uL (ref 4.0–10.5)
nRBC: 0 % (ref 0.0–0.2)

## 2018-07-26 LAB — BASIC METABOLIC PANEL
Anion gap: 9 (ref 5–15)
BUN: 16 mg/dL (ref 8–23)
CO2: 27 mmol/L (ref 22–32)
Calcium: 8.9 mg/dL (ref 8.9–10.3)
Chloride: 105 mmol/L (ref 98–111)
Creatinine, Ser: 1.2 mg/dL (ref 0.61–1.24)
GFR calc Af Amer: >60 mL/min (ref >60)
GFR calc non Af Amer: 58 mL/min — ABNORMAL LOW (ref >60)
Glucose, Bld: 132 mg/dL — ABNORMAL HIGH (ref 70–99)
Potassium: 3.2 mmol/L — ABNORMAL LOW (ref 3.5–5.1)
Sodium: 141 mmol/L (ref 135–145)

## 2018-07-26 MED ORDER — MORPHINE SULFATE (PF) 4 MG/ML IV SOLN
4.0000 mg | Freq: Once | INTRAVENOUS | Status: AC
  Administered 2018-07-26: 19:00:00 4 mg via INTRAVENOUS
  Filled 2018-07-26: qty 1

## 2018-07-26 MED ORDER — PHENAZOPYRIDINE HCL 200 MG PO TABS: 200.0000 mg | ORAL_TABLET | Freq: Three times a day (TID) | ORAL | 0 refills | Status: DC

## 2018-07-26 MED ORDER — SODIUM CHLORIDE 0.9 % IV SOLN
1.0000 g | Freq: Once | INTRAVENOUS | Status: AC
  Administered 2018-07-26: 1 g via INTRAVENOUS
  Filled 2018-07-26: qty 10

## 2018-07-26 MED ORDER — ONDANSETRON HCL 4 MG/2ML IJ SOLN
4.0000 mg | Freq: Once | INTRAMUSCULAR | Status: AC
  Administered 2018-07-26: 19:00:00 4 mg via INTRAVENOUS
  Filled 2018-07-26: qty 2

## 2018-07-26 MED ORDER — PHENAZOPYRIDINE HCL 100 MG PO TABS
200.0000 mg | ORAL_TABLET | Freq: Once | ORAL | Status: AC
  Administered 2018-07-26: 20:00:00 200 mg via ORAL
  Filled 2018-07-26: qty 2

## 2018-07-26 MED ORDER — POTASSIUM CHLORIDE CRYS ER 20 MEQ PO TBCR
40.0000 meq | EXTENDED_RELEASE_TABLET | Freq: Once | ORAL | Status: AC
  Administered 2018-07-26: 21:00:00 40 meq via ORAL
  Filled 2018-07-26: qty 2

## 2018-07-26 MED ORDER — CEPHALEXIN 500 MG PO CAPS: 500.0000 mg | ORAL_CAPSULE | Freq: Two times a day (BID) | ORAL | 0 refills | Status: AC

## 2018-07-26 NOTE — ED Notes (Signed)
NT emptied catheter bag of approximately 200 ml urine.

## 2018-07-26 NOTE — ED Notes (Signed)
Advised patient not to drive after discharge due to narcotic medication administration. Patient verbalized understanding. Discharged via wheelchair with family to drive him home.

## 2018-07-26 NOTE — ED Triage Notes (Signed)
Patient complains of bladder cramping pain and supra pubic catheter due to cancer. States pain x 1 week.

## 2018-07-26 NOTE — ED Provider Notes (Signed)
[facility] EMERGENCY DEPARTMENT Provider Note   CSN: [id number] Arrival date & time: 07/26/18  1825    History   Chief Complaint Chief Complaint  Patient presents with   cramping suprapubic catheter    HPI Jonathan [name] is a 78 y.o. male with a history most significant for DM, COPD, bladder diverticulum with resection 2012, bladder stones,prostate cancer and neurogenic bladder with suprapubic catheter since 1/20 (last changed 2 weeks ago) presenting with suprapubic pain described as frequent spasms in association with bloody urine with new development of blood clots.  He denies fevers, chills, n/v, back pain and denies urinary retention stating he has been having to empty the catheter bag like normal.  He suspects uti which he has had previously with similar sx.  He denies drainage or redness around the catheter site. He has had no treatment prior to arrival for his sx.     The history is provided by the patient.    Past Medical History:  Diagnosis Date   Acquired bladder diverticulum    12/ 2012  resection diverticulum done at [facility] in [place], [place]   Age-related cataract of right eye    scheduled for catarat extraction 09-12-2017   Agent orange exposure    Aneurysm of infrarenal abdominal aorta ([place])    first dx 2017--- last CT 06-11-2017  measures 3.5cm   Chronic constipation    COPD with emphysema ([place])    06--12-2017  per pt no symptoms since Feb 2019   Depression    Diabetes mellitus type 2, diet-controlled ([place])    Diabetic neuropathy (HCC)    Dyslipidemia    Dyspnea    increased exertion; fumes   Dyspnea on exertion    Erectile dysfunction    Feeling of incomplete bladder emptying    GERD (gastroesophageal reflux disease)    Gout    09-02-2017 last flare up 3 months ago   Hiatal hernia    History of atrial fibrillation    episode post op right lung lobectomy 07-04-2015   History of bladder stone    History of colon polyps    History of  DVT of lower extremity    03/ 2019  bilateral lower extremity (superficial) ---  per treated w/ oral medication    History of gastritis    History of kidney stones    History of radiation therapy 09-14-2015  to 09-21-2015   left upper lung nodule -- 54Gy in 3 fractions (18 Gy per fraction)   Hyperplasia of prostate with lower urinary tract symptoms (LUTS)    Hypertension    Lumbar spinal stenosis    Nephrolithiasis    Neurogenic bladder    Osteoarthritis    ankle, hands   Osteoporosis    Prostate cancer [facility]) urologist-  dr wrenn/  oncologist-  dr Tammi Klippel   dx 05-24-2017-- Stage T2b,  Gleason 4+4,  PSA 22.41,  vol 38cc--- started ADT 04/ 2019,  plan external radiation therapy   Radiation fibrosis of lung ([place])    hx left upper long nodule SBRT 06/ 2017   Squamous cell carcinoma of both lungs ([place]) last CT in epic dated 06-26-2017 no recurrence   dx 03/ 2017  non-small cell SCC via bronchoscopy w/ bx's by dr Roxan Hockey---  s/p  right VATS w/ right lobectomy and node dissection's 07-04-2015 /  pt had SBRT to left upper nodule Stage 1 ([facility]) completed 09-21-2015   Wears dentures    bottom   Wears glasses  Patient Active Problem List   Diagnosis Date Noted   Urinary retention 04/18/2018   Malignant neoplasm of prostate ([place]) 07/10/2017   Arthritis of right subtalar joint 04/18/2017   Claw hand due to intrinsic minus deformity, right 05/22/2016   Idiopathic chronic venous hypertension of both lower extremities with inflammation 05/22/2016   Fibrosis of subtalar joint, right 03/08/2016   Primary malignant neoplasm of left upper lobe of lung ([place]) 09/21/2015   Lung cancer ([place]) 07/04/2015   Diabetes mellitus type II, controlled ([place]) 06/01/2015   Neoplasm of uncertain behavior of right upper lobe of lung 05/31/2015   Neoplasm of uncertain behavior of left upper lobe of lung 05/31/2015   Emphysema of lung ([place])    Kidney stones     Osteoporosis    H/O total ankle replacement, right 04/20/2015   Carpal tunnel syndrome - right 07/09/2014   Abdominal bloating 06/14/2014   History of colonic polyps 06/14/2014   Diabetic polyneuropathy associated with type 2 diabetes mellitus ([place]) 12/05/2012   Lesion of ulnar nerve 12/05/2012   Syncope 11/15/2012   Gout 11/15/2012   Constipation 04/02/2009   CYSTITIS, RECURRENT 12/02/2008   COPD 10/26/2008   DIVERTICULUM, BLADDER 10/11/2008   INSOMNIA 07/24/2008   ACUTE CYSTITIS 07/20/2008   FATIGUE 07/20/2008   DEPRESSION 02/27/2008   CHEST PAIN UNSPECIFIED 02/27/2008   NICOTINE ADDICTION 08/12/2007   DYSLIPIDEMIA 07/22/2007   OBESITY 07/22/2007   ERECTILE DYSFUNCTION 07/22/2007   HYPERTENSION 07/22/2007    Past Surgical History:  Procedure Laterality Date   CARPAL TUNNEL RELEASE Right 07/09/2014   Procedure: RIGHT OPEN CARPAL TUNNEL RELEASE;  Surgeon: Mcarthur Rossetti, MD;  Location: [facility];  Service: Orthopedics;  Laterality: Right;   CHOLECYSTECTOMY N/A 02/24/2014   Procedure: LAPAROSCOPIC CHOLECYSTECTOMY;  Surgeon: Coralie Keens, MD;  Location: [place];  Service: General;  Laterality: N/A;   COLONOSCOPY     CYSTOLITHOTOMY  11/ 2007      [facility] [place] 04/18/[facility]   Procedure: CYSTOSCOPY FLEXIBLE;  Surgeon: Irine Seal, MD;  Location: [facility];  Service: Urology;  Laterality: N/A;   CYSTOSCOPY W/ LITHOLAPAXY / EHL  02-24-2007   dr Janice Norrie  [place] N/A 09/05/2017   Procedure: GOLD SEED IMPLANT;  Surgeon: Irine Seal, MD;  Location: [facility];  Service: Urology;  Laterality: N/A;   INSERTION OF SUPRAPUBIC CATHETER N/A 04/18/2018   Procedure: SUPRAPUBIC TUBE CHANGE;  Surgeon: Irine Seal, MD;  Location: [facility];  Service: Urology;  Laterality: N/A;   IR CATHETER TUBE CHANGE  02/24/2018   SPACE OAR INSTILLATION N/A 09/05/2017   Procedure: SPACE OAR INSTILLATION;  Surgeon:  Irine Seal, MD;  Location[facility];  Service: Urology;  Laterality: N/A;   TOTAL ANKLE ARTHROPLASTY Right 04/20/2015   Procedure: TOTAL ANKLE ARTHOPLASTY;  Surgeon: Newt Minion, MD;  Location: [place];  Service: Orthopedics;  Laterality: Right;   TRANSTHORACIC ECHOCARDIOGRAM  11/16/2012   ef 55-60%,  grade 1 diastolic dysfunction/  mild dilated ascending aorta/  mild MR/ trivial TR   VIDEO ASSISTED THORACOSCOPY (VATS)/ LOBECTOMY Right 07/04/2015   Procedure: VIDEO ASSISTED THORACOSCOPY (VATS)/ RIGHT UPPER LOBECTOMY;  Surgeon: Melrose Nakayama, MD;  Location: [place];  Service: Thoracic;  Laterality: Right;   VIDEO BRONCHOSCOPY N/A 06/17/2015   Procedure: VIDEO BRONCHOSCOPY;  Surgeon: Melrose Nakayama, MD;  Location: [place];  Service: Thoracic;  Laterality: N/A;   VIDEO BRONCHOSCOPY WITH ENDOBRONCHIAL NAVIGATION N/A  06/17/2015   Procedure: VIDEO BRONCHOSCOPY WITH ENDOBRONCHIAL NAVIGATION;  Surgeon: Melrose Nakayama, MD;  Location: [place];  Service: Thoracic;  Laterality: N/A;   VIDEO BRONCHOSCOPY WITH ENDOBRONCHIAL ULTRASOUND N/A 06/17/2015   Procedure: VIDEO BRONCHOSCOPY WITH ENDOBRONCHIAL ULTRASOUND;  Surgeon: Melrose Nakayama, MD;  Location: [place];  Service: Thoracic;  Laterality: N/A;        Home Medications    Prior to Admission medications   Medication Sig Start Date End Date Taking? Authorizing Provider  amoxicillin (AMOXIL) 500 MG tablet Take 500 mg by mouth daily.    [provider]  atenolol (TENORMIN) 50 MG tablet Take 1 tablet (50 mg total) by mouth daily. 07/01/18 09/29/18  Arnoldo Lenis, MD  belladonna-opium (B&O SUPPRETTES) 16.2-30 MG suppository Place 1 suppository (30 mg total) rectally 2 (two) times daily as needed for pain (rectal spasms). 12/21/17   Duffy Bruce, MD  cephALEXin (KEFLEX) 500 MG capsule Take 1 capsule (500 mg total) by mouth 2 (two) times daily for 7 days. 07/26/18 08/02/18  Evalee Jefferson, PA-C  chlorthalidone  (HYGROTON) 25 MG tablet Take 0.5 tablets (12.5 mg total) by mouth daily. 07/01/18   Arnoldo Lenis, MD  gabapentin (NEURONTIN) 400 MG capsule Take 400 mg by mouth 2 (two) times daily.    [provider]  ketorolac (ACULAR) 0.4 % SOLN Place 1 drop into the left eye 4 (four) times daily. 11/28/17   [provider]  LANTUS SOLOSTAR 100 UNIT/ML Solostar Pen Inject 5 Units into the skin daily.  11/28/17   [provider]  LINZESS 72 MCG capsule Take 72 mcg by mouth every other day.  10/03/17   [provider]  meloxicam (MOBIC) 15 MG tablet Take 15 mg by mouth daily.    [provider]  mirabegron ER (MYRBETRIQ) 50 MG TB24 tablet Take 50 mg by mouth daily.    [provider]  nicotine polacrilex (NICORETTE) 4 MG gum Take 4 mg by mouth as needed for smoking cessation.    [provider]  nitrofurantoin (MACRODANTIN) 100 MG capsule Take 100 mg by mouth 2 (two) times daily. 04/04/18   [provider]  ofloxacin (OCUFLOX) 0.3 % ophthalmic solution Place 1 drop into the left eye daily.  11/28/17   [provider]  Oxycodone HCl 20 MG TABS Take 10 mg by mouth 3 (three) times daily. 02/02/18   [provider]  phenazopyridine (PYRIDIUM) 200 MG tablet Take 1 tablet (200 mg total) by mouth 3 (three) times daily. 07/26/18   Evalee Jefferson, PA-C  prednisoLONE acetate (PRED FORTE) 1 % ophthalmic suspension Place 1 drop into both eyes daily.  11/28/17   [provider]  predniSONE (DELTASONE) 20 MG tablet Take 20-40 mg by mouth daily as needed (for gout symptoms).     [provider]  sildenafil (VIAGRA) 100 MG tablet Take 100 mg by mouth daily as needed for erectile dysfunction.     [provider]  SIMBRINZA 1-0.2 % SUSP Place 1 drop into both eyes daily.  09/24/17   [provider]  tamsulosin (FLOMAX) 0.4 MG CAPS capsule Take 0.4 mg by mouth daily after supper. Reported on 10/12/2015    [provider]  timolol (TIMOPTIC) 0.5 % ophthalmic solution Place 1 drop into both eyes daily. 01/09/18   [provider]    Family History Family History  Problem Relation Age of Onset   Cancer Father        Asbestos  Colon cancer Neg Hx    Colon polyps Neg Hx    Kidney disease Neg Hx    Esophageal cancer Neg Hx    Gallbladder disease Neg Hx    Heart disease Neg Hx    Diabetes Neg Hx     Social History Social History   Tobacco Use   Smoking status: Former Smoker    Packs/day: 0.50    Years: 40.00    Pack years: 20.00    Types: Cigarettes    Last attempt to quit: 02/13/2015    Years since quitting: 3.4   Smokeless tobacco: Never Used  Substance Use Topics   Alcohol use: No    Alcohol/week: 0.0 standard drinks   Drug use: No     Allergies   Ciprofloxacin and Levaquin [levofloxacin]   Review of Systems Review of Systems  Constitutional: Negative for chills and fever.  HENT: Negative.   Eyes: Negative.   Respiratory: Negative for chest tightness.   Cardiovascular: Negative for chest pain.  Gastrointestinal: Negative for abdominal pain, diarrhea, nausea and vomiting.  Genitourinary: Positive for dysuria and hematuria.  Musculoskeletal: Negative for back pain.  Skin: Negative.  Negative for rash and wound.  Neurological: Negative for dizziness, light-headedness and headaches.  Psychiatric/Behavioral: Negative.      Physical Exam Updated Vital Signs BP 136/82 (BP Location: Right Arm)    Pulse 77    Temp 98.7 F (37.1 C) (Oral)    Resp 18    Ht 6\' 8"  (2.032 m)    Wt 114.8 kg    SpO2 99%    BMI 27.79 kg/m   Physical Exam   ED Treatments / Results  Labs (all labs ordered are listed, but only abnormal results are displayed) Labs Reviewed  URINALYSIS, ROUTINE W REFLEX MICROSCOPIC - Abnormal; Notable for the following components:      Result Value   APPearance HAZY (*)    Hgb urine dipstick LARGE (*)    Protein, ur 30 (*)     Nitrite POSITIVE (*)    Leukocytes,Ua TRACE (*)    RBC / HPF >50 (*)    Bacteria, UA FEW (*)    All other components within normal limits  BASIC METABOLIC PANEL - Abnormal; Notable for the following components:   Potassium 3.2 (*)    Glucose, Bld 132 (*)    GFR calc non Af Amer 58 (*)    All other components within normal limits  CBC WITH DIFFERENTIAL/PLATELET - Abnormal; Notable for the following components:   RBC 3.59 (*)    Hemoglobin 11.2 (*)    HCT 33.4 (*)    All other components within normal limits  URINE CULTURE    EKG None  Radiology No results found.  Procedures Procedures (including critical care time)  Medications Ordered in ED Medications  potassium chloride SA (K-DUR) CR tablet 40 mEq (has no administration in time range)  ondansetron (ZOFRAN) injection 4 mg (4 mg Intravenous Given 07/26/18 1918)  morphine 4 MG/ML injection 4 mg (4 mg Intravenous Given 07/26/18 1918)  phenazopyridine (PYRIDIUM) tablet 200 mg (200 mg Oral Given 07/26/18 2021)  cefTRIAXone (ROCEPHIN) 1 g in sodium chloride 0.9 % 100 mL IVPB (1 g Intravenous New Bag/Given 07/26/18 2023)     Initial Impression / Assessment and Plan / ED Course  I have reviewed the triage vital signs and the nursing notes.  Pertinent labs & imaging results that were available during my care of the patient were reviewed by me and  considered in my medical decision making (see chart for details).        Pt with uti, cx sent.  Rocephin here, keflex prescribed, pyridium. Return precautions given.    Final Clinical Impressions(s) / ED Diagnoses   Final diagnoses:  Acute cystitis with hematuria    ED Discharge Orders         Ordered    cephALEXin (KEFLEX) 500 MG capsule  2 times daily     07/26/18 2103    phenazopyridine (PYRIDIUM) 200 MG tablet  3 times daily     07/26/18 2103           Landis Martins 07/26/18 2107    Nat Christen, MD 07/27/18 [phone]

## 2018-07-26 NOTE — Discharge Instructions (Signed)
Take the entire course of the antibiotics prescribed. You may also use the pyridium to help with bladder spasm pain - this will turn your urine orange and is normal.  Your blood tests are ok tonight, but your potassium is a little low - this was replaced this evening with an oral supplement.

## 2018-07-29 LAB — URINE CULTURE: Culture: >=100000 — AB

## 2018-07-30 ENCOUNTER — Telehealth: Payer: Self-pay | Admitting: Emergency Medicine

## 2018-07-30 NOTE — Progress Notes (Signed)
ED Antimicrobial Stewardship Positive Culture Follow Up   Jonathan [name] is an 78 y.o. male who presented to [facility] on 07/26/2018 with a chief complaint of bladder cramping x 1 week. Patient with known suprapubic catheter and PMH significant for prostate cancer. In the ED patient received x 1 dose CTX and discharged on cephalexin 500 mg PO BID x 7 days. UCx now with >= 100K colonies/mL Enterobacter cancerogenus (resistant to cefazolin). UA nitrite positive, trace leukocytes, few bacteria, WBC 11-20. Discussed with PA that patient is most likely colonized given catheter.   Plan: Follow-up wellness check If patient has no symptoms do not treat If patient has symptoms, treat with Bactrim 160mg /800mg  PO BID x 7 days  Chief Complaint  Patient presents with  . cramping suprapubic catheter    Recent Results (from the past 720 hour(s))  Urine Culture     Status: Abnormal   Collection Time: 07/26/18  7:10 PM  Result Value Ref Range Status   Specimen Description   Final    URINE, CLEAN CATCH Performed at [facility], [address] [place], [place] [address]    Special Requests   Final    NONE Performed at [facility], [address], [place], [place] [address]    Culture >=100,000 COLONIES/mL ENTEROBACTER CANCEROGENUS (A)  Final   Report Status 07/29/2018 FINAL  Final   Organism ID, Bacteria ENTEROBACTER CANCEROGENUS (A)  Final      Susceptibility   Enterobacter cancerogenus - MIC*    CEFAZOLIN >=64 RESISTANT Resistant     CEFTRIAXONE 8 SENSITIVE Sensitive     CIPROFLOXACIN <=0.25 SENSITIVE Sensitive     GENTAMICIN <=1 SENSITIVE Sensitive     IMIPENEM 0.5 SENSITIVE Sensitive     NITROFURANTOIN 32 SENSITIVE Sensitive     TRIMETH/SULFA <=20 SENSITIVE Sensitive     PIP/TAZO 64 INTERMEDIATE Intermediate     * >=100,000 COLONIES/mL ENTEROBACTER CANCEROGENUS    ED Provider: Okey Regal, PA-C  Thank you for allowing pharmacy to be a part of this patient's care.  Leron Croak, PharmD PGY1 Pharmacy Resident Phone: [phone]  Please check AMION for all [place] phone numbers 07/30/2018, 10:00 AM

## 2018-07-30 NOTE — Telephone Encounter (Signed)
Post ED Visit - Positive Culture Follow-up: Successful Patient Follow-Up  Culture assessed and recommendations reviewed by:  []  Elenor Quinones, Pharm.D. []  Heide Guile, Pharm.D., BCPS AQ-ID []  Parks Neptune, Pharm.D., BCPS []  Alycia Rossetti, Pharm.D., BCPS []  [place], Pharm.D., BCPS, AAHIVP []  Legrand Como, Pharm.D., BCPS, AAHIVP []  Salome Arnt, PharmD, BCPS []  Johnnette Gourd, PharmD, BCPS []  Hughes Better, PharmD, BCPS []  Leeroy Cha, PharmD Leron Croak PharmD  Positive urine culture  []  Patient discharged without antimicrobial prescription and treatment is now indicated []  Organism is resistant to prescribed ED discharge antimicrobial []  Patient with positive blood cultures  Changes discussed with ED provider: Leron Croak PharmD New antibiotic prescription symptom check, if + symptoms, treat with Bactrim 160mg /800 mg po bid x 7 days  Patient states still had symptoms and spoke with his PCP yesterday and they called in rx for bactrim, also states had diarrhea with cephalexin, advised to be sure takes bactrim on full stomach bid and to get probiotic =/or yogurt to help with diarrhea   Hazle Nordmann 07/30/2018, 11:56 AM

## 2018-08-02 ENCOUNTER — Emergency Department (HOSPITAL_COMMUNITY)
Admission: EM | Admit: 2018-08-02 | Discharge: 2018-08-02 | Disposition: A | Discharge status: Home / Self Care | Payer: Medicare Other | Attending: Emergency Medicine | Admitting: Emergency Medicine

## 2018-08-02 ENCOUNTER — Other Ambulatory Visit: Payer: Self-pay

## 2018-08-02 ENCOUNTER — Encounter (HOSPITAL_COMMUNITY): Payer: Self-pay | Admitting: Emergency Medicine

## 2018-08-02 DIAGNOSIS — R319 Hematuria, unspecified: Secondary | ICD-10-CM | POA: Diagnosis not present

## 2018-08-02 DIAGNOSIS — J449 Chronic obstructive pulmonary disease, unspecified: Secondary | ICD-10-CM | POA: Diagnosis not present

## 2018-08-02 DIAGNOSIS — Z87891 Personal history of nicotine dependence: Secondary | ICD-10-CM | POA: Insufficient documentation

## 2018-08-02 DIAGNOSIS — Z435 Encounter for attention to cystostomy: Secondary | ICD-10-CM | POA: Diagnosis not present

## 2018-08-02 DIAGNOSIS — N319 Neuromuscular dysfunction of bladder, unspecified: Secondary | ICD-10-CM | POA: Diagnosis not present

## 2018-08-02 DIAGNOSIS — T83198A Other mechanical complication of other urinary devices and implants, initial encounter: Secondary | ICD-10-CM | POA: Diagnosis not present

## 2018-08-02 LAB — CBC WITH DIFFERENTIAL/PLATELET
Abs Immature Granulocytes: 0.01 10*3/uL (ref 0.00–0.07)
Basophils Absolute: 0 10*3/uL (ref 0.0–0.1)
Basophils Relative: 0 %
Eosinophils Absolute: 0.2 10*3/uL (ref 0.0–0.5)
Eosinophils Relative: 3 %
HCT: 35.3 % — ABNORMAL LOW (ref 39.0–52.0)
Hemoglobin: 11.7 g/dL — ABNORMAL LOW (ref 13.0–17.0)
Immature Granulocytes: 0 %
Lymphocytes Relative: 27 %
Lymphs Abs: 1.3 10*3/uL (ref 0.7–4.0)
MCH: 31 pg (ref 26.0–34.0)
MCHC: 33.1 g/dL (ref 30.0–36.0)
MCV: 93.4 fL (ref 80.0–100.0)
Monocytes Absolute: 0.4 10*3/uL (ref 0.1–1.0)
Monocytes Relative: 8 %
Neutro Abs: 2.9 10*3/uL (ref 1.7–7.7)
Neutrophils Relative %: 62 %
Platelets: 165 10*3/uL (ref 150–400)
RBC: 3.78 MIL/uL — ABNORMAL LOW (ref 4.22–5.81)
RDW: 14.2 % (ref 11.5–15.5)
WBC: 4.7 10*3/uL (ref 4.0–10.5)
nRBC: 0 % (ref 0.0–0.2)

## 2018-08-02 LAB — BASIC METABOLIC PANEL
Anion gap: 9 (ref 5–15)
BUN: 18 mg/dL (ref 8–23)
CO2: 26 mmol/L (ref 22–32)
Calcium: 9 mg/dL (ref 8.9–10.3)
Chloride: 104 mmol/L (ref 98–111)
Creatinine, Ser: 1.38 mg/dL — ABNORMAL HIGH (ref 0.61–1.24)
GFR calc Af Amer: 57 mL/min — ABNORMAL LOW (ref >60)
GFR calc non Af Amer: 49 mL/min — ABNORMAL LOW (ref >60)
Glucose, Bld: 111 mg/dL — ABNORMAL HIGH (ref 70–99)
Potassium: 3.5 mmol/L (ref 3.5–5.1)
Sodium: 139 mmol/L (ref 135–145)

## 2018-08-02 LAB — URINALYSIS, ROUTINE W REFLEX MICROSCOPIC
Bilirubin Urine: NEGATIVE
Glucose, UA: NEGATIVE mg/dL
Ketones, ur: NEGATIVE mg/dL
Nitrite: NEGATIVE
Protein, ur: 100 mg/dL — AB
RBC / HPF: >50 RBC/hpf — ABNORMAL HIGH (ref 0–5)
Specific Gravity, Urine: 1.021 (ref 1.005–1.030)
pH: 6 (ref 5.0–8.0)

## 2018-08-02 MED ORDER — ONDANSETRON HCL 4 MG/2ML IJ SOLN
4.0000 mg | Freq: Once | INTRAMUSCULAR | Status: AC
  Administered 2018-08-02: 20:00:00 4 mg via INTRAVENOUS
  Filled 2018-08-02: qty 2

## 2018-08-02 MED ORDER — SODIUM CHLORIDE 0.9 % IV SOLN
1.0000 g | Freq: Once | INTRAVENOUS | Status: AC
  Administered 2018-08-02: 20:00:00 1 g via INTRAVENOUS
  Filled 2018-08-02: qty 10

## 2018-08-02 MED ORDER — MORPHINE SULFATE (PF) 4 MG/ML IV SOLN
4.0000 mg | Freq: Once | INTRAVENOUS | Status: AC
  Administered 2018-08-02: 20:00:00 4 mg via INTRAVENOUS
  Filled 2018-08-02: qty 1

## 2018-08-02 NOTE — ED Triage Notes (Signed)
Pt C/O spasms and irritation at the suprapubic catheter site and into his abdomen.

## 2018-08-02 NOTE — ED Notes (Signed)
Leg bag changed out with standard drainage bag per pt request and UA collected.

## 2018-08-02 NOTE — ED Provider Notes (Signed)
[facility] EMERGENCY DEPARTMENT Provider Note   CSN: [id number] Arrival date & time: 08/02/18  1857    History   Chief Complaint Chief Complaint  Patient presents with  . Suprapubic Catheter Problem    HPI Jonathan [name] is a 78 y.o. male.     Pt presents to the ED today with suprapubic catheter pain.  The pt has a hx of neurogenic bladder requiring a suprapubic catheter.  He has been getting frequent infections.  He was here on 5/2 for the same.  He had a UTI with enterobacter cancerogenus with the following sensitivities:    Enterobacter cancerogenus   MIC   CEFAZOLIN >=64 RESIST... Resistant   CEFTRIAXONE 8 SENSITIVE  Sensitive   CIPROFLOXACIN <=0.25 SENS... Sensitive   GENTAMICIN <=1 SENSITIVE  Sensitive   IMIPENEM 0.5 SENSITIVE  Sensitive   NITROFURANTOIN 32 SENSITIVE  Sensitive   PIP/TAZO 64 INTERMED... Intermediate   TRIMETH/SULFA <=20 SENSIT... Sensitive       Susceptibility Comments    Enterobacter cancerogenus >=100,000 COLONIES/mL ENTEROBACTER CANCEROGENUS   He was initially treated with IV rocephin and d/c home on keflex.  When the sensitivities came back, he was changed to bactrim.  He took the bactrim and started feeling better, but developed more pain yesterday.  The pt has an appt with urology on 5/11.  His catheter was last changed about 20 days ago.  No f/c.  No cough or sob.     Past Medical History:  Diagnosis Date  . Acquired bladder diverticulum    12/ 2012  resection diverticulum done at [facility] in [place], [place]  . Age-related cataract of right eye    scheduled for catarat extraction 09-12-2017  . Agent orange exposure   . Aneurysm of infrarenal abdominal aorta ([place])    first dx 2017--- last CT 06-11-2017  measures 3.5cm  . Chronic constipation   . COPD with emphysema ([place])    06--12-2017  per pt no symptoms since Feb 2019  . Depression   . Diabetes mellitus type 2, diet-controlled ([place])   . Diabetic neuropathy ([place])   . Dyslipidemia    . Dyspnea    increased exertion; fumes  . Dyspnea on exertion   . Erectile dysfunction   . Feeling of incomplete bladder emptying   . GERD (gastroesophageal reflux disease)   . Gout    09-02-2017 last flare up 3 months ago  . Hiatal hernia   . History of atrial fibrillation    episode post op right lung lobectomy 07-04-2015  . History of bladder stone   . History of colon polyps   . History of DVT of lower extremity    03/ 2019  bilateral lower extremity (superficial) ---  per treated w/ oral medication   . History of gastritis   . History of kidney stones   . History of radiation therapy 09-14-2015  to 09-21-2015   left upper lung nodule -- 54Gy in 3 fractions (18 Gy per fraction)  . Hyperplasia of prostate with lower urinary tract symptoms (LUTS)   . Hypertension   . Lumbar spinal stenosis   . Nephrolithiasis   . Neurogenic bladder   . Osteoarthritis    ankle, hands  . Osteoporosis   . Prostate cancer [facility]) urologist-  dr wrenn/  oncologist-  dr Tammi Klippel   dx 05-24-2017-- Stage T2b,  Gleason 4+4,  PSA 22.41,  vol 38cc--- started ADT 04/ 2019,  plan external radiation therapy  . Radiation fibrosis of lung ([place])  hx left upper long nodule SBRT 06/ 2017  . Squamous cell carcinoma of both lungs ([place]) last CT in epic dated 06-26-2017 no recurrence   dx 03/ 2017  non-small cell SCC via bronchoscopy w/ bx's by dr Roxan Hockey---  s/p  right VATS w/ right lobectomy and node dissection's 07-04-2015 /  pt had SBRT to left upper nodule Stage 1 ([facility]) completed 09-21-2015  . Wears dentures    bottom  . Wears glasses     Patient Active Problem List   Diagnosis Date Noted  . Urinary retention 04/18/2018  . Malignant neoplasm of prostate ([place]) 07/10/2017  . Arthritis of right subtalar joint 04/18/2017  . 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 Idiopathic chronic venous hypertension of both lower extremities with inflammation 05/22/2016  .  Fibrosis of subtalar joint, right 03/08/2016  . Primary malignant neoplasm of left upper lobe of lung ([place]) 09/21/2015  . Lung cancer ([place]) 07/04/2015  . Diabetes mellitus type II, controlled ([place]) 06/01/2015  . 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 Emphysema of lung ([place])   . Kidney stones   . Osteoporosis   . H/O total ankle replacement, right 04/20/2015  . Carpal tunnel syndrome - right 07/09/2014  . Abdominal bloating 06/14/2014  . History of colonic polyps 06/14/2014  . Diabetic polyneuropathy associated with type 2 diabetes mellitus ([place]) 12/05/2012  . Lesion of ulnar nerve 12/05/2012  . Syncope 11/15/2012  . Gout 11/15/2012  . Constipation 04/02/2009  . CYSTITIS, RECURRENT 12/02/2008  . COPD 10/26/2008  . DIVERTICULUM, BLADDER 10/11/2008  . INSOMNIA 07/24/2008  . ACUTE CYSTITIS 07/20/2008  . FATIGUE 07/20/2008  . DEPRESSION 02/27/2008  . CHEST PAIN UNSPECIFIED 02/27/2008  . NICOTINE ADDICTION 08/12/2007  . DYSLIPIDEMIA 07/22/2007  . OBESITY 07/22/2007  . ERECTILE DYSFUNCTION 07/22/2007  . HYPERTENSION 07/22/2007    Past Surgical History:  Procedure Laterality Date  . CARPAL TUNNEL RELEASE Right 07/09/2014   Procedure: RIGHT OPEN CARPAL TUNNEL RELEASE;  Surgeon: Mcarthur Rossetti, MD;  Location: [facility];  Service: Orthopedics;  Laterality: Right;  . CHOLECYSTECTOMY N/A 02/24/2014   Procedure: LAPAROSCOPIC CHOLECYSTECTOMY;  Surgeon: Coralie Keens, MD;  Location: [place];  Service: General;  Laterality: N/A;  . COLONOSCOPY    . CYSTOLITHOT[facility]Y  [facility] [place]  . CYSTOSCOPY N/A 04/18/2018   Procedure: CYSTOSCOPY FLEXIBLE;  Surgeon: Irine Seal, MD;  Location: [facility];  Service: Urology;  Laterality: N/A;  . CYSTOSCOPY W/ LITHOLAPAXY / EHL  02-24-2007   dr Janice Norrie  [facility]  . GOLD SEED IMPLANT N/A 09/05/2017   Procedure: GOLD SEED  IMPLANT;  Surgeon: Irine Seal, MD;  Location[facility];  Service: Urology;  Laterality: N/A;  . INSERTION OF SUPRAPUBIC CATHETER N/A 04/18/2018   Procedure: SUPRAPUBIC TUBE CHANGE;  Surgeon: Irine Seal, MD;  Location: [facility];  Service: Urology;  Laterality: N/A;  . IR CATHETER TUBE CHANGE  02/24/2018  . SPACE OAR INSTILLATION N/A 09/05/2017   Procedure: SPACE OAR INSTILLATION;  Surgeon: Irine Seal, MD;  Location: [facility];  Service: Urology;  Laterality: N/A;  . TOTAL ANKLE ARTHROPLASTY Right 04/20/2015   Procedure: TOTAL ANKLE ARTHOPLASTY;  Surgeon: Newt Minion, MD;  Location: [place];  Service: Orthopedics;  Laterality: Right;  . TRANSTHORACIC ECHOCARDIOGRAM  11/16/2012   ef 55-60%,  grade 1 diastolic dysfunction/  mild dilated ascending aorta/  mild MR/ trivial TR  . VIDEO ASSISTED THORACOSCOPY (VATS)/ LOBECTOMY Right 07/04/2015   Procedure: VIDEO ASSISTED THORACOSCOPY (VATS)/ RIGHT UPPER LOBECTOMY;  Surgeon: Melrose Nakayama, MD;  Location: [place];  Service: Thoracic;  Laterality: Right;  [name] VIDEO BRONCHOSCOPY N/A 06/17/2015   Procedure: VIDEO BRONCHOSCOPY;  Surgeon: Melrose Nakayama, MD;  Location: [place];  Service: Thoracic;  Laterality: N/A;  . VIDEO BRONCHOSCOPY WITH ENDOBRONCHIAL NAVIGATION N/A 06/17/2015   Procedure: VIDEO BRONCHOSCOPY WITH ENDOBRONCHIAL NAVIGATION;  Surgeon: Melrose Nakayama, MD;  Location: [place];  Service: Thoracic;  Laterality: N/A;  . VIDEO BRONCHOSCOPY WITH ENDOBRONCHIAL ULTRASOUND N/A 06/17/2015   Procedure: VIDEO BRONCHOSCOPY WITH ENDOBRONCHIAL ULTRASOUND;  Surgeon: Melrose Nakayama, MD;  Location: [place];  Service: Thoracic;  Laterality: N/A;        Home Medications    Prior to Admission medications   Medication Sig Start Date End Date Taking? Authorizing Provider  amoxicillin (AMOXIL) 500 MG tablet Take 500 mg by mouth daily.    [provider]  atenolol (TENORMIN) 50 MG tablet Take 1 tablet (50 mg  total) by mouth daily. 07/01/18 09/29/18  Arnoldo Lenis, MD  belladonna-opium (B&O SUPPRETTES) 16.2-30 MG suppository Place 1 suppository (30 mg total) rectally 2 (two) times daily as needed for pain (rectal spasms). 12/21/17   Duffy Bruce, MD  cephALEXin (KEFLEX) 500 MG capsule Take 1 capsule (500 mg total) by mouth 2 (two) times daily for 7 days. 07/26/18 08/02/18  Evalee Jefferson, PA-C  chlorthalidone (HYGROTON) 25 MG tablet Take 0.5 tablets (12.5 mg total) by mouth daily. 07/01/18   Arnoldo Lenis, MD  gabapentin (NEURONTIN) 400 MG capsule Take 400 mg by mouth 2 (two) times daily.    [provider]  ketorolac (ACULAR) 0.4 % SOLN Place 1 drop into the left eye 4 (four) times daily. 11/28/17   [provider]  LANTUS SOLOSTAR 100 UNIT/ML Solostar Pen Inject 5 Units into the skin daily.  11/28/17   [provider]  LINZESS 72 MCG capsule Take 72 mcg by mouth every other day.  10/03/17   [provider]  meloxicam (MOBIC) 15 MG tablet Take 15 mg by mouth daily.    [provider]  mirabegron ER (MYRBETRIQ) 50 MG TB24 tablet Take 50 mg by mouth daily.    [provider]  nicotine polacrilex (NICORETTE) 4 MG gum Take 4 mg by mouth as needed for smoking cessation.    [provider]  nitrofurantoin (MACRODANTIN) 100 MG capsule Take 100 mg by mouth 2 (two) times daily. 04/04/18   [provider]  ofloxacin (OCUFLOX) 0.3 % ophthalmic solution Place 1 drop into the left eye daily.  11/28/17   [provider]  Oxycodone HCl 20 MG TABS Take 10 mg by mouth 3 (three) times daily. 02/02/18   [provider]  phenazopyridine (PYRIDIUM) 200 MG tablet Take 1 tablet (200 mg total) by mouth 3 (three) times daily. 07/26/18   Evalee Jefferson, PA-C  prednisoLONE acetate (PRED FORTE) 1 % ophthalmic suspension Place 1 drop into both eyes daily.  11/28/17   [provider]  predniSONE (DELTASONE) 20 MG tablet Take 20-40 mg by mouth daily as  needed (for gout symptoms).     [provider]  sildenafil (VIAGRA) 100 MG tablet Take 100 mg by mouth daily as needed for erectile dysfunction.     [provider]  SIMBRINZA 1-0.2 % SUSP  Place 1 drop into both eyes daily.  09/24/17   [provider]  tamsulosin (FLOMAX) 0.4 MG CAPS capsule Take 0.4 mg by mouth daily after supper. Reported on 10/12/2015    [provider]  timolol (TIMOPTIC) 0.5 % ophthalmic solution Place 1 drop into both eyes daily. 01/09/18   [provider]    Family History Family History  Problem Relation Age of Onset  . Cancer Father        Asbestos  . Colon cancer Neg Hx   . Colon polyps Neg Hx   . Kidney disease Neg Hx   . Esophageal cancer Neg Hx   . Gallbladder disease Neg Hx   . Heart disease Neg Hx   . Diabetes Neg Hx     Social History Social History   Tobacco Use  . Smoking status: Former Smoker    Packs/day: 0.50    Years: 40.00    Pack years: 20.00    Types: Cigarettes    Last attempt to quit: 02/13/2015    Years since quitting: 3.4  . Smokeless tobacco: Never Used  Substance Use Topics  . Alcohol use: No    Alcohol/week: 0.0 standard drinks  . Drug use: No     Allergies   Ciprofloxacin and Levaquin [levofloxacin]   Review of Systems Review of Systems  Gastrointestinal: Positive for abdominal pain.  Genitourinary: Positive for dysuria.  All other systems reviewed and are negative.    Physical Exam Updated Vital Signs BP (!) 173/89 (BP Location: Right Arm)   Pulse 83   Temp 98.2 F (36.8 C) (Oral)   Resp 18   SpO2 97%   Physical Exam Vitals signs and nursing note reviewed.  Constitutional:      Appearance: Normal appearance.  HENT:     Head: Normocephalic and atraumatic.     Right Ear: External ear normal.     Left Ear: External ear normal.     Nose: Nose normal.     Mouth/Throat:     Mouth: Mucous membranes are moist.     Pharynx: Oropharynx is clear.  Eyes:      Extraocular Movements: Extraocular movements intact.     Conjunctiva/sclera: Conjunctivae normal.     Pupils: Pupils are equal, round, and reactive to light.  Neck:     Musculoskeletal: Normal range of motion and neck supple.  Cardiovascular:     Rate and Rhythm: Normal rate and regular rhythm.     Pulses: Normal pulses.     Heart sounds: Normal heart sounds.  Pulmonary:     Effort: Pulmonary effort is normal.     Breath sounds: Normal breath sounds.  Abdominal:     General: Abdomen is flat. Bowel sounds are normal.     Palpations: Abdomen is soft.     Comments: Suprapubic catheter in place.  No bleeding around site.  Musculoskeletal: Normal range of motion.  Skin:    General: Skin is warm.     Capillary Refill: Capillary refill takes less than 2 seconds.  Neurological:     General: No focal deficit present.     Mental Status: He is alert and oriented to person, place, and time.  Psychiatric:        Mood and Affect: Mood normal.        Behavior: Behavior normal.      ED Treatments / Results  Labs (all labs ordered are listed, but only abnormal results are displayed) Labs Reviewed  URINALYSIS, ROUTINE W  REFLEX MICROSCOPIC - Abnormal; Notable for the following components:      Result Value   APPearance HAZY (*)    Hgb urine dipstick LARGE (*)    Protein, ur 100 (*)    Leukocytes,Ua SMALL (*)    RBC / HPF >50 (*)    Bacteria, UA RARE (*)    All other components within normal limits  BASIC METABOLIC PANEL - Abnormal; Notable for the following components:   Glucose, Bld 111 (*)    Creatinine, Ser 1.38 (*)    GFR calc non Af Amer 49 (*)    GFR calc Af Amer 57 (*)    All other components within normal limits  CBC WITH DIFFERENTIAL/PLATELET - Abnormal; Notable for the following components:   RBC 3.78 (*)    Hemoglobin 11.7 (*)    HCT 35.3 (*)    All other components within normal limits  URINE CULTURE    EKG None  Radiology No results found.  Procedures  Procedures (including critical care time)  Medications Ordered in ED Medications  morphine 4 MG/ML injection 4 mg (4 mg Intravenous Given 08/02/18 1955)  ondansetron (ZOFRAN) injection 4 mg (4 mg Intravenous Given 08/02/18 1955)  cefTRIAXone (ROCEPHIN) 1 g in sodium chloride 0.9 % 100 mL IVPB (1 g Intravenous New Bag/Given 08/02/18 1957)     Initial Impression / Assessment and Plan / ED Course  I have reviewed the triage vital signs and the nursing notes.  Pertinent labs & imaging results that were available during my care of the patient were reviewed by me and considered in my medical decision making (see chart for details).       UA today shows some hematuria, but no infection.  Urine sent for culture.  Pt is told to finish his Bactrim.  He is told to keep his appt with urology on Monday the 11th.  He knows to return if worse.  Final Clinical Impressions(s) / ED Diagnoses   Final diagnoses:  Encounter for suprapubic catheter care [facility])  Hematuria, unspecified type    ED Discharge Orders    None       Isla Pence, MD 08/02/18 2053

## 2018-08-04 LAB — URINE CULTURE: Culture: <10000 — AB

## 2018-08-12 DIAGNOSIS — G609 Hereditary and idiopathic neuropathy, unspecified: Secondary | ICD-10-CM | POA: Diagnosis not present

## 2018-08-12 DIAGNOSIS — M19072 Primary osteoarthritis, left ankle and foot: Secondary | ICD-10-CM | POA: Diagnosis not present

## 2018-08-12 DIAGNOSIS — R2689 Other abnormalities of gait and mobility: Secondary | ICD-10-CM | POA: Diagnosis not present

## 2018-08-12 DIAGNOSIS — M19071 Primary osteoarthritis, right ankle and foot: Secondary | ICD-10-CM | POA: Diagnosis not present

## 2018-08-12 DIAGNOSIS — E1351 Other specified diabetes mellitus with diabetic peripheral angiopathy without gangrene: Secondary | ICD-10-CM | POA: Diagnosis not present

## 2018-08-12 DIAGNOSIS — B351 Tinea unguium: Secondary | ICD-10-CM | POA: Diagnosis not present

## 2018-08-12 DIAGNOSIS — M79672 Pain in left foot: Secondary | ICD-10-CM | POA: Diagnosis not present

## 2018-08-12 DIAGNOSIS — M79671 Pain in right foot: Secondary | ICD-10-CM | POA: Diagnosis not present

## 2018-08-15 ENCOUNTER — Ambulatory Visit (INDEPENDENT_AMBULATORY_CARE_PROVIDER_SITE_OTHER): Payer: Medicare Other | Admitting: Urology

## 2018-08-15 DIAGNOSIS — R338 Other retention of urine: Secondary | ICD-10-CM | POA: Diagnosis not present

## 2018-08-15 DIAGNOSIS — C61 Malignant neoplasm of prostate: Secondary | ICD-10-CM | POA: Diagnosis not present

## 2018-08-23 DIAGNOSIS — D529 Folate deficiency anemia, unspecified: Secondary | ICD-10-CM | POA: Diagnosis not present

## 2018-08-23 DIAGNOSIS — R42 Dizziness and giddiness: Secondary | ICD-10-CM | POA: Diagnosis not present

## 2018-08-23 DIAGNOSIS — F411 Generalized anxiety disorder: Secondary | ICD-10-CM | POA: Diagnosis not present

## 2018-08-23 DIAGNOSIS — E1143 Type 2 diabetes mellitus with diabetic autonomic (poly)neuropathy: Secondary | ICD-10-CM | POA: Diagnosis not present

## 2018-08-23 DIAGNOSIS — R413 Other amnesia: Secondary | ICD-10-CM | POA: Diagnosis not present

## 2018-08-23 DIAGNOSIS — E0865 Diabetes mellitus due to underlying condition with hyperglycemia: Secondary | ICD-10-CM | POA: Diagnosis not present

## 2018-08-23 DIAGNOSIS — R531 Weakness: Secondary | ICD-10-CM | POA: Diagnosis not present

## 2018-08-23 DIAGNOSIS — I10 Essential (primary) hypertension: Secondary | ICD-10-CM | POA: Diagnosis not present

## 2018-08-23 DIAGNOSIS — D519 Vitamin B12 deficiency anemia, unspecified: Secondary | ICD-10-CM | POA: Diagnosis not present

## 2018-08-23 DIAGNOSIS — K5901 Slow transit constipation: Secondary | ICD-10-CM | POA: Diagnosis not present

## 2018-08-23 DIAGNOSIS — Z6827 Body mass index (BMI) 27.0-27.9, adult: Secondary | ICD-10-CM | POA: Diagnosis not present

## 2018-08-23 DIAGNOSIS — M25571 Pain in right ankle and joints of right foot: Secondary | ICD-10-CM | POA: Diagnosis not present

## 2018-08-23 DIAGNOSIS — R1084 Generalized abdominal pain: Secondary | ICD-10-CM | POA: Diagnosis not present

## 2018-08-23 DIAGNOSIS — D509 Iron deficiency anemia, unspecified: Secondary | ICD-10-CM | POA: Diagnosis not present

## 2018-08-23 DIAGNOSIS — C61 Malignant neoplasm of prostate: Secondary | ICD-10-CM | POA: Diagnosis not present

## 2018-08-27 DIAGNOSIS — M25571 Pain in right ankle and joints of right foot: Secondary | ICD-10-CM | POA: Diagnosis not present

## 2018-08-27 DIAGNOSIS — G5621 Lesion of ulnar nerve, right upper limb: Secondary | ICD-10-CM | POA: Diagnosis not present

## 2018-08-27 DIAGNOSIS — C61 Malignant neoplasm of prostate: Secondary | ICD-10-CM | POA: Diagnosis not present

## 2018-08-27 DIAGNOSIS — F411 Generalized anxiety disorder: Secondary | ICD-10-CM | POA: Diagnosis not present

## 2018-08-27 DIAGNOSIS — E1143 Type 2 diabetes mellitus with diabetic autonomic (poly)neuropathy: Secondary | ICD-10-CM | POA: Diagnosis not present

## 2018-08-27 DIAGNOSIS — K5901 Slow transit constipation: Secondary | ICD-10-CM | POA: Diagnosis not present

## 2018-08-27 DIAGNOSIS — Z6827 Body mass index (BMI) 27.0-27.9, adult: Secondary | ICD-10-CM | POA: Diagnosis not present

## 2018-08-27 DIAGNOSIS — R42 Dizziness and giddiness: Secondary | ICD-10-CM | POA: Diagnosis not present

## 2018-09-19 ENCOUNTER — Ambulatory Visit (INDEPENDENT_AMBULATORY_CARE_PROVIDER_SITE_OTHER): Payer: Medicare Other | Admitting: Urology

## 2018-09-19 DIAGNOSIS — R338 Other retention of urine: Secondary | ICD-10-CM | POA: Diagnosis not present

## 2018-09-24 DIAGNOSIS — R2689 Other abnormalities of gait and mobility: Secondary | ICD-10-CM | POA: Diagnosis not present

## 2018-09-24 DIAGNOSIS — E1142 Type 2 diabetes mellitus with diabetic polyneuropathy: Secondary | ICD-10-CM | POA: Diagnosis not present

## 2018-09-24 DIAGNOSIS — M19072 Primary osteoarthritis, left ankle and foot: Secondary | ICD-10-CM | POA: Diagnosis not present

## 2018-09-24 DIAGNOSIS — M19071 Primary osteoarthritis, right ankle and foot: Secondary | ICD-10-CM | POA: Diagnosis not present

## 2018-10-02 DIAGNOSIS — G5621 Lesion of ulnar nerve, right upper limb: Secondary | ICD-10-CM | POA: Diagnosis not present

## 2018-10-02 DIAGNOSIS — C61 Malignant neoplasm of prostate: Secondary | ICD-10-CM | POA: Diagnosis not present

## 2018-10-02 DIAGNOSIS — F411 Generalized anxiety disorder: Secondary | ICD-10-CM | POA: Diagnosis not present

## 2018-10-02 DIAGNOSIS — K5901 Slow transit constipation: Secondary | ICD-10-CM | POA: Diagnosis not present

## 2018-10-02 DIAGNOSIS — M25571 Pain in right ankle and joints of right foot: Secondary | ICD-10-CM | POA: Diagnosis not present

## 2018-10-02 DIAGNOSIS — Z6827 Body mass index (BMI) 27.0-27.9, adult: Secondary | ICD-10-CM | POA: Diagnosis not present

## 2018-10-02 DIAGNOSIS — E1143 Type 2 diabetes mellitus with diabetic autonomic (poly)neuropathy: Secondary | ICD-10-CM | POA: Diagnosis not present

## 2018-10-02 DIAGNOSIS — R42 Dizziness and giddiness: Secondary | ICD-10-CM | POA: Diagnosis not present

## 2018-10-06 ENCOUNTER — Ambulatory Visit: Payer: Medicare Other | Admitting: Cardiology

## 2018-10-17 ENCOUNTER — Ambulatory Visit: Payer: Medicare Other | Admitting: Urology

## 2018-10-17 DIAGNOSIS — M76822 Posterior tibial tendinitis, left leg: Secondary | ICD-10-CM | POA: Diagnosis not present

## 2018-10-17 DIAGNOSIS — R338 Other retention of urine: Secondary | ICD-10-CM | POA: Diagnosis not present

## 2018-10-17 DIAGNOSIS — M2022 Hallux rigidus, left foot: Secondary | ICD-10-CM | POA: Diagnosis not present

## 2018-10-17 DIAGNOSIS — E1351 Other specified diabetes mellitus with diabetic peripheral angiopathy without gangrene: Secondary | ICD-10-CM | POA: Diagnosis not present

## 2018-10-17 DIAGNOSIS — N3000 Acute cystitis without hematuria: Secondary | ICD-10-CM | POA: Diagnosis not present

## 2018-10-17 DIAGNOSIS — R2689 Other abnormalities of gait and mobility: Secondary | ICD-10-CM | POA: Diagnosis not present

## 2018-10-17 DIAGNOSIS — B351 Tinea unguium: Secondary | ICD-10-CM | POA: Diagnosis not present

## 2018-10-17 DIAGNOSIS — M19072 Primary osteoarthritis, left ankle and foot: Secondary | ICD-10-CM | POA: Diagnosis not present

## 2018-11-05 ENCOUNTER — Ambulatory Visit (INDEPENDENT_AMBULATORY_CARE_PROVIDER_SITE_OTHER): Payer: Medicare Other | Admitting: Urology

## 2018-11-05 ENCOUNTER — Other Ambulatory Visit: Payer: Self-pay

## 2018-11-05 DIAGNOSIS — N3000 Acute cystitis without hematuria: Secondary | ICD-10-CM | POA: Diagnosis not present

## 2018-11-21 ENCOUNTER — Ambulatory Visit (INDEPENDENT_AMBULATORY_CARE_PROVIDER_SITE_OTHER): Payer: Medicare Other | Admitting: Urology

## 2018-11-21 ENCOUNTER — Other Ambulatory Visit: Payer: Self-pay

## 2018-11-21 DIAGNOSIS — C61 Malignant neoplasm of prostate: Secondary | ICD-10-CM | POA: Diagnosis not present

## 2018-11-21 DIAGNOSIS — Z87440 Personal history of urinary (tract) infections: Secondary | ICD-10-CM

## 2018-11-21 DIAGNOSIS — Z20822 Contact with and (suspected) exposure to covid-19: Secondary | ICD-10-CM

## 2018-11-21 DIAGNOSIS — N403 Nodular prostate with lower urinary tract symptoms: Secondary | ICD-10-CM | POA: Diagnosis not present

## 2018-11-21 DIAGNOSIS — R338 Other retention of urine: Secondary | ICD-10-CM

## 2018-11-21 DIAGNOSIS — R6889 Other general symptoms and signs: Secondary | ICD-10-CM | POA: Diagnosis not present

## 2018-11-22 LAB — NOVEL CORONAVIRUS, NAA: SARS-CoV-2, NAA: NOT DETECTED

## 2018-12-05 ENCOUNTER — Other Ambulatory Visit (HOSPITAL_COMMUNITY)
Admission: RE | Admit: 2018-12-05 | Discharge: 2018-12-05 | Disposition: A | Discharge status: Home / Self Care | Payer: Medicare Other | Source: Other Acute Inpatient Hospital | Attending: Urology | Admitting: Urology

## 2018-12-05 ENCOUNTER — Other Ambulatory Visit: Payer: Self-pay

## 2018-12-05 ENCOUNTER — Ambulatory Visit (INDEPENDENT_AMBULATORY_CARE_PROVIDER_SITE_OTHER): Payer: Medicare Other | Admitting: Urology

## 2018-12-05 DIAGNOSIS — N3020 Other chronic cystitis without hematuria: Secondary | ICD-10-CM | POA: Diagnosis not present

## 2018-12-05 DIAGNOSIS — R3914 Feeling of incomplete bladder emptying: Secondary | ICD-10-CM | POA: Diagnosis not present

## 2018-12-07 LAB — URINE CULTURE: Culture: >=100000 — AB

## 2018-12-16 DIAGNOSIS — Z6827 Body mass index (BMI) 27.0-27.9, adult: Secondary | ICD-10-CM | POA: Diagnosis not present

## 2018-12-16 DIAGNOSIS — M25571 Pain in right ankle and joints of right foot: Secondary | ICD-10-CM | POA: Diagnosis not present

## 2018-12-16 DIAGNOSIS — G5621 Lesion of ulnar nerve, right upper limb: Secondary | ICD-10-CM | POA: Diagnosis not present

## 2018-12-16 DIAGNOSIS — C61 Malignant neoplasm of prostate: Secondary | ICD-10-CM | POA: Diagnosis not present

## 2018-12-16 DIAGNOSIS — R42 Dizziness and giddiness: Secondary | ICD-10-CM | POA: Diagnosis not present

## 2018-12-16 DIAGNOSIS — F411 Generalized anxiety disorder: Secondary | ICD-10-CM | POA: Diagnosis not present

## 2018-12-16 DIAGNOSIS — E1143 Type 2 diabetes mellitus with diabetic autonomic (poly)neuropathy: Secondary | ICD-10-CM | POA: Diagnosis not present

## 2018-12-16 DIAGNOSIS — R300 Dysuria: Secondary | ICD-10-CM | POA: Diagnosis not present

## 2018-12-24 ENCOUNTER — Ambulatory Visit: Payer: Medicare Other | Admitting: Urology

## 2019-01-02 ENCOUNTER — Other Ambulatory Visit: Payer: Self-pay

## 2019-01-02 ENCOUNTER — Ambulatory Visit (INDEPENDENT_AMBULATORY_CARE_PROVIDER_SITE_OTHER): Payer: Medicare Other | Admitting: Urology

## 2019-01-02 DIAGNOSIS — R3914 Feeling of incomplete bladder emptying: Secondary | ICD-10-CM | POA: Diagnosis not present

## 2019-01-13 ENCOUNTER — Ambulatory Visit (INDEPENDENT_AMBULATORY_CARE_PROVIDER_SITE_OTHER): Payer: Medicare Other | Admitting: Urology

## 2019-01-13 DIAGNOSIS — N3289 Other specified disorders of bladder: Secondary | ICD-10-CM

## 2019-01-13 DIAGNOSIS — R310 Gross hematuria: Secondary | ICD-10-CM

## 2019-01-13 DIAGNOSIS — R338 Other retention of urine: Secondary | ICD-10-CM | POA: Diagnosis not present

## 2019-01-14 ENCOUNTER — Other Ambulatory Visit: Payer: Self-pay | Admitting: Urology

## 2019-01-19 DIAGNOSIS — R300 Dysuria: Secondary | ICD-10-CM | POA: Diagnosis not present

## 2019-01-19 DIAGNOSIS — Z6826 Body mass index (BMI) 26.0-26.9, adult: Secondary | ICD-10-CM | POA: Diagnosis not present

## 2019-01-19 DIAGNOSIS — R319 Hematuria, unspecified: Secondary | ICD-10-CM | POA: Diagnosis not present

## 2019-01-23 ENCOUNTER — Ambulatory Visit (INDEPENDENT_AMBULATORY_CARE_PROVIDER_SITE_OTHER): Payer: Medicare Other | Admitting: Urology

## 2019-01-23 ENCOUNTER — Other Ambulatory Visit: Payer: Self-pay

## 2019-01-23 ENCOUNTER — Other Ambulatory Visit (HOSPITAL_COMMUNITY)
Admission: RE | Admit: 2019-01-23 | Discharge: 2019-01-23 | Disposition: A | Discharge status: Home / Self Care | Payer: Medicare Other | Source: Other Acute Inpatient Hospital | Attending: Urology | Admitting: Urology

## 2019-01-23 DIAGNOSIS — N3021 Other chronic cystitis with hematuria: Secondary | ICD-10-CM | POA: Insufficient documentation

## 2019-01-23 DIAGNOSIS — Z8546 Personal history of malignant neoplasm of prostate: Secondary | ICD-10-CM

## 2019-01-23 DIAGNOSIS — R338 Other retention of urine: Secondary | ICD-10-CM

## 2019-01-23 DIAGNOSIS — N403 Nodular prostate with lower urinary tract symptoms: Secondary | ICD-10-CM

## 2019-01-23 LAB — URINALYSIS, COMPLETE (UACMP) WITH MICROSCOPIC
Bacteria, UA: NONE SEEN
Bilirubin Urine: NEGATIVE
Glucose, UA: NEGATIVE mg/dL
Ketones, ur: NEGATIVE mg/dL
Nitrite: NEGATIVE
Protein, ur: NEGATIVE mg/dL
Specific Gravity, Urine: 1.024 (ref 1.005–1.030)
pH: 5 (ref 5.0–8.0)

## 2019-01-24 LAB — URINE CULTURE: Culture: NO GROWTH

## 2019-01-29 DIAGNOSIS — Z6837 Body mass index (BMI) 37.0-37.9, adult: Secondary | ICD-10-CM | POA: Diagnosis not present

## 2019-01-29 DIAGNOSIS — N390 Urinary tract infection, site not specified: Secondary | ICD-10-CM | POA: Insufficient documentation

## 2019-01-29 DIAGNOSIS — R319 Hematuria, unspecified: Secondary | ICD-10-CM | POA: Diagnosis not present

## 2019-01-29 DIAGNOSIS — R300 Dysuria: Secondary | ICD-10-CM | POA: Diagnosis not present

## 2019-01-29 DIAGNOSIS — C61 Malignant neoplasm of prostate: Secondary | ICD-10-CM | POA: Diagnosis not present

## 2019-01-29 DIAGNOSIS — R339 Retention of urine, unspecified: Secondary | ICD-10-CM | POA: Diagnosis not present

## 2019-01-29 NOTE — Patient Instructions (Signed)
DUE TO COVID-19 ONLY ONE VISITOR IS ALLOWED TO COME WITH YOU AND STAY IN THE WAITING ROOM ONLY DURING PRE OP AND PROCEDURE DAY OF SURGERY. THE 1 VISITOR MAY VISIT WITH YOU AFTER SURGERY IN YOUR PRIVATE ROOM DURING VISITING HOURS ONLY!  YOU NEED TO HAVE A COVID 19 TEST ON_______ @_______ , THIS TEST MUST BE DONE BEFORE SURGERY, COME  [place], [place] , [address].  ([place]) ONCE YOUR COVID TEST IS COMPLETED, PLEASE BEGIN THE QUARANTINE INSTRUCTIONS AS OUTLINED IN YOUR HANDOUT.                HANSEL DEVAN  01/29/2019   Your procedure is scheduled on: 02-03-19   Report to [facility] Main  Entrance   Report to short stay  at         0530  AM     Call this number if you have problems the morning of surgery [phone]    Remember: Do not eat food or drink liquids :After Midnight.  BRUSH YOUR TEETH MORNING OF SURGERY AND RINSE YOUR MOUTH OUT, NO CHEWING GUM CANDY OR MINTS.     Take these medicines the morning of surgery with A SIP OF WATER: eye drops as usual, oxycodone, myrbetriq               Lantus 50% of dose if BS greater then 130                                 You may not have any metal on your body including hair pins and              piercings  Do not wear jewelry, , lotions, powders or perfumes, deodorant         .              Men may shave face and neck.   Do not bring valuables to the hospital. [place].  Contacts, dentures or bridgework may not be worn into surgery.                  Please read over the following fact sheets you were given: _____________________________________________________________________             [facility] - Preparing for Surgery Before surgery, you can play an important role.  Because skin is not sterile, your skin needs to be as free of germs as possible.  You can reduce the number of germs on your skin by washing with CHG (chlorahexidine  gluconate) soap before surgery.  CHG is an antiseptic cleaner which kills germs and bonds with the skin to continue killing germs even after washing. Please DO NOT use if you have an allergy to CHG or antibacterial soaps.  If your skin becomes reddened/irritated stop using the CHG and inform your nurse when you arrive at Short Stay. Do not shave (including legs and underarms) for at least 48 hours prior to the first CHG shower.  You may shave your face/neck. Please follow these instructions carefully:  1.  Shower with CHG Soap the night before surgery and the  morning of Surgery.  2.  If you choose to wash your hair, wash your hair first as usual with your  normal  shampoo.  3.  After you  shampoo, rinse your hair and body thoroughly to remove the  shampoo.                           4.  Use CHG as you would any other liquid soap.  You can apply chg directly  to the skin and wash                       Gently with a scrungie or clean washcloth.  5.  Apply the CHG Soap to your body ONLY FROM THE NECK DOWN.   Do not use on face/ open                           Wound or open sores. Avoid contact with eyes, ears mouth and genitals (private parts).                       Wash face,  Genitals (private parts) with your normal soap.             6.  Wash thoroughly, paying special attention to the area where your surgery  will be performed.  7.  Thoroughly rinse your body with warm water from the neck down.  8.  DO NOT shower/wash with your normal soap after using and rinsing off  the CHG Soap.                9.  Pat yourself dry with a clean towel.            10.  Wear clean pajamas.            11.  Place clean sheets on your bed the night of your first shower and do not  sleep with pets. Day of Surgery : Do not apply any lotions/deodorants the morning of surgery.  Please wear clean clothes to the hospital/surgery center.  FAILURE TO FOLLOW THESE INSTRUCTIONS MAY RESULT IN THE CANCELLATION OF YOUR  SURGERY PATIENT SIGNATURE_________________________________  NURSE SIGNATURE__________________________________  ________________________________________________________________________

## 2019-01-30 ENCOUNTER — Other Ambulatory Visit (HOSPITAL_COMMUNITY)
Admission: RE | Admit: 2019-01-30 | Discharge: 2019-01-30 | Disposition: A | Discharge status: Home / Self Care | Payer: Medicare Other | Source: Ambulatory Visit | Attending: Urology | Admitting: Urology

## 2019-01-30 ENCOUNTER — Ambulatory Visit: Payer: Medicare Other | Admitting: Urology

## 2019-01-30 ENCOUNTER — Encounter (HOSPITAL_COMMUNITY): Payer: Self-pay

## 2019-01-30 ENCOUNTER — Encounter (HOSPITAL_COMMUNITY)
Admission: RE | Admit: 2019-01-30 | Discharge: 2019-01-30 | Disposition: A | Discharge status: Home / Self Care | Payer: Medicare Other | Source: Ambulatory Visit | Attending: Urology | Admitting: Urology

## 2019-01-30 ENCOUNTER — Other Ambulatory Visit: Payer: Self-pay

## 2019-01-30 DIAGNOSIS — Z01818 Encounter for other preprocedural examination: Secondary | ICD-10-CM | POA: Diagnosis not present

## 2019-01-30 DIAGNOSIS — Z20828 Contact with and (suspected) exposure to other viral communicable diseases: Secondary | ICD-10-CM | POA: Insufficient documentation

## 2019-01-30 LAB — BASIC METABOLIC PANEL
Anion gap: 9 (ref 5–15)
BUN: 17 mg/dL (ref 8–23)
CO2: 26 mmol/L (ref 22–32)
Calcium: 9.2 mg/dL (ref 8.9–10.3)
Chloride: 105 mmol/L (ref 98–111)
Creatinine, Ser: 1.28 mg/dL — ABNORMAL HIGH (ref 0.61–1.24)
GFR calc Af Amer: >60 mL/min (ref >60)
GFR calc non Af Amer: 54 mL/min — ABNORMAL LOW (ref >60)
Glucose, Bld: 97 mg/dL (ref 70–99)
Potassium: 3.6 mmol/L (ref 3.5–5.1)
Sodium: 140 mmol/L (ref 135–145)

## 2019-01-30 LAB — CBC
HCT: 37.4 % — ABNORMAL LOW (ref 39.0–52.0)
Hemoglobin: 11.9 g/dL — ABNORMAL LOW (ref 13.0–17.0)
MCH: 29.6 pg (ref 26.0–34.0)
MCHC: 31.8 g/dL (ref 30.0–36.0)
MCV: 93 fL (ref 80.0–100.0)
Platelets: 181 10*3/uL (ref 150–400)
RBC: 4.02 MIL/uL — ABNORMAL LOW (ref 4.22–5.81)
RDW: 13.4 % (ref 11.5–15.5)
WBC: 5 10*3/uL (ref 4.0–10.5)
nRBC: 0 % (ref 0.0–0.2)

## 2019-01-30 LAB — HEMOGLOBIN A1C
Hgb A1c MFr Bld: 5.9 % — ABNORMAL HIGH (ref 4.8–5.6)
Mean Plasma Glucose: 122.63 mg/dL

## 2019-01-30 LAB — GLUCOSE, CAPILLARY: Glucose-Capillary: 87 mg/dL (ref 70–99)

## 2019-01-30 NOTE — Progress Notes (Addendum)
PCP - [name] Cardiologist -   Chest x-ray -  EKG - 01-30-19 epic Stress Test -  ECHO -  Cardiac Cath -   Sleep Study -  CPAP -   Fasting Blood Sugar - 100's Checks Blood Sugar _____ times a day  Blood Thinner Instructions: Aspirin Instructions: Last Dose:  Anesthesia review: COPD No oxygen , Mild aneurysmal dilation infarenal abdominal aorta 3.5cm on CT 12-21-17, RUL lobectomy  Patient denies shortness of breath, fever, cough and chest pain at PAT appointment   NONE   Patient verbalized understanding of instructions that were given to them at the PAT appointment. Patient was also instructed that they will need to review over the PAT instructions again at home before surgery. note

## 2019-01-30 NOTE — Progress Notes (Signed)
Called portable for an extended bed with date of surgery and time of arrival to Short Stay.Pt. is 6'8"

## 2019-01-31 LAB — NOVEL CORONAVIRUS, NAA (HOSP ORDER, SEND-OUT TO REF LAB; TAT 18-24 HRS): SARS-CoV-2, NAA: NOT DETECTED

## 2019-02-02 MED ORDER — GENTAMICIN SULFATE 40 MG/ML IJ SOLN
5.0000 mg/kg | INTRAVENOUS | Status: AC
  Administered 2019-02-03: 560 mg via INTRAVENOUS
  Filled 2019-02-02: qty 14

## 2019-02-02 NOTE — H&P (Signed)
CC: I have urinary retention.  HPI: Jonathan [name] is a 78 year-old male established patient who is here for urinary retention.  His problem was diagnosed approximately 09/23/2017. His current symptoms did not begin after he had a surgical procedure. His urinary retention is being treated with suprapubic tube. Patient denies foley catheter, intemittent catheterization, flomax, hytrin, cardura, uroxatrol, rapaflo, avodart, and proscar.   He does have an abnormal sensation when needing to urinate. He does have to strain or bear down to start his urinary stream. He does not have a good size and strength to his urinary stream. He is having problems with emptying his bladder well. His urine has shut off completely.   He is not having problems with urinary control or incontinence.   He has previously had an indwelling catheter in for more than two weeks at a time.   10.9.2020: [name] returns today for a SP tube change. He is having some back pain but it doesn't lateralize. He has not attempted to plug the foley for a voiding trial. He has some recent hematuria and has been treated for infections. he is on ampicillin for a dental implant. He had UDS on 4/20 showed a 272m bladder with some instability. His detrusor contraction was good but he was unable to void. He completed EXRT in 9/19 so he couldn't have a TURP until 9/20. He has oxybutynin for the spasms but isn't on it. He doesn't have the spasms if he avoids constipation. He hasn't been taking the tamsulosin either.   10.20.2020: He presents today c/o increased frequency of bladder spasms and blood per urine for the past few days. He tried to be seen by the ER this morning but was instructed to instead return to our office. He is in severe pain today and is having difficulties moving. He reports that he also was passing clots this last weekend but this has cleared -- he is now having difficulties passing urine. He is very concerned with this pain,  which he reports is generalized across his urinary tract.   01/23/19: WAsmarreturns today in f/u for his history of chronic retention with BPH with BOO and prostate cancer with prior radiation therapy. His PSA was 0.1 in 8/20 and he a year out from the radiation so he can now have a TUR. He has had recurrent issues with pain and bleeding. He is currently on nitrofurantoin. He has improved with the nitrofurantoin. He is scheduled for a TURP on 11/10. He has been using B&O's and oxycodone for the pain.     AUA Symptom Score: He never has the sensation of not emptying his bladder completely after finishing urinating. He never has to urinate again less that two hours after he has finished urinating. He does not have to stop and start again several times when he urinates. He never finds it difficult to postpone urination. He never has a weak urinary stream. He never has to push or strain to begin urination. He never has to get up to urinate from the time he goes to bed until the time he gets up in the morning.   Calculated AUA Symptom Score: 0    ALLERGIES: Bactrim Cipro Erythromycin    MEDICATIONS: Oxybutynin Chloride 5 mg tablet 1 tablet PO TID PRN  Viagra 100 mg tablet  Atenolol-Chlorthalidone 50 mg-25 mg tablet  Belladonna-Opium 60 mg-16.2 mg suppository, rectal 1 suppository PR Q 6 H PRN  Chantix 0.5 mg (11)-1 mg (42) tablet, dose pack  Flomax 0.4 mg capsule  Folo 102; Foley Stabilization Device attach to leg as needed.  Furosemide 40 MG Oral Tablet Oral  Ibuprofen TABS Oral  Klor-Con 10 10 meq tablet, extended release  Latanoprost 0.005 % drops  Linzess 72 mcg capsule  Metoprolol Tartrate 50 MG Oral Tablet Oral  Mupirocin 2 % ointment  Nitrofurantoin 100 mg capsule  Oxycodone Hcl 10 mg tablet  Pantoprazole Sodium 20 mg tablet, delayed release  PredniSONE TABS Oral  Promethazine Hcl 25 mg tablet  Reglan 5 mg tablet  Simbrinza 1 %-0.2 % suspension, drops  Spiriva  Vitamin D TABS  Oral  Zanaflex 4 mg tablet     GU PSH: Complex cystometrogram, w/ void pressure and urethral pressure profile studies, any technique - 07/14/2018 Complex Uroflow - 07/14/2018 Cysto Bladder Stone <2.5cm - 2008 Emg surf Electrd - 07/14/2018 Inject For cystogram - 07/14/2018 Intrabd voidng Press - 07/14/2018 Partial Cystectomy - about 2011 PLACE RT DEVICE/MARKER, PROS - 09/05/2017 Prostate Needle Biopsy - 2019 Simple Change SP Tube - 01/02/2019, 12/05/2018, 11/05/2018, 10/17/2018, 09/19/2018, 08/15/2018, 06/18/2018, 05/23/2018 Transperineal Plmt Biodegradable Matrl 1/Mlt Njx - 09/05/2017       PSH Notes: Bladder Surgery,  Cystoscopy With Fragmentation Of Bladder Calculus  lung surgery  ankle surgery  Dental extractions  Dental implants   NON-GU PSH: Carpal tunnel surgery Cholecystectomy (laparoscopic) Lung lobectomy, Left - about 2015 Surgical Pathology, Gross And Microscopic Examination For Prostate Needle - 2019     GU PMH: Gross hematuria, Blood today is much lighter than it has been over the last few days and there are no clots present. Secondary to radiation cystitis. - 01/13/2019 Other Disorders Of Bladder, Painful bladder spasms since 10.17.2020. He has not been taking oxybutinin to manage these. - 01/13/2019 Urinary Retention (Worsening), Tube irrigated today -- no clots present and there is no need for a tube change today. - 01/13/2019, I will have him start plugging the SP tube and try to void and if he fails we could consider a TURP. , - 01/02/2019, - 11/21/2018, - 10/17/2018, He has had some problems with the tube and I will change the frequency of replacement to q3wks. He was encouraged to try to clamp the tube to see if he can void. If he contiinues to have problems we could consider a TURP in September, are year after he completes RTx. , - 08/15/2018, We discussed strategies for managing his SP tube and timing for removal. I will send a urine for culture today and set him up for  Urodynamics to better clarify his bladder function prior to considering tube removal. , - 07/07/2018, Tube changed. He will return in a month and will have a voiding trial and tube change if needed. , - 05/23/2018, He is overdue for an SP tube change. I am going to take him to the OR and change his tube under MAC with possible cystoscopy. I have reviewed the risks of the procedure in detail. , - 04/18/2018, He failed a voiding trial today. I discussed the options and I will get him set up for a suprapubic tube insertion by IR. , - 01/03/2018 (Worsening), - 12/06/2017, He is in retention from the radiation and wants to get back on CIC and I have given him a script for caths. , - 11/15/2017 Low back pain, He has marked spinal arthritis and I have recommended he talk to his neurosurgeon, Dr. Larose Hires. - 01/02/2019 Personal Hx Urinary Tract Infections, Urine culture at his next visit. - 11/21/2018  Prostate Cancer, I will get a PSA and testosterone today. - 11/21/2018, Lupron given today. He will get labs today and in 3 months. , - 08/15/2018, Labs today. Lupron given. He will need Lupron and labs in 6 months and I will have him return with a PSA in 3 months to assess the possibility of a voiding trial. , - 02/07/2018, He will need Lupron in November. , - 12/06/2017, He will return in November for his prostate cancer f/u. , - 11/15/2017, He is doing well on ADT with improvement in his voiding symptoms. he has seen Dr. Tammi Klippel and needs to get set up for [facility] and [place]. He was given Lupron 46m today and will return in 6 months with labs for his next injection. I will get a PSA and testosterone today since he didn't get that done yet. , - 07/26/2017, He has high risk T3b N0 M0 Gleason 9 prostate cancer that is likely as not related to his agent orange exposure. I have discussed options for therapy but with his locally advanced disease and comorbidities I think he will be best served with ADT and EXRT. I will have him return  for [place] 2477mand see Dr. MaLedon Snareor consideration of radiation therapy. He will see me back in 6-8 weeks for a PSA and testosterone with Lupron 4565mI will place fiducials and [employer] based on Dr. ManJohny Shearscommendations. , - 2019 Acute Cystitis/UTI - 10/17/2018 ED due to arterial insufficiency, He has severe ED. - 2019 Elevated PSA - 2019, He has a markedly elevated PSA with a right apical nodule that is suspicious for cancer. I am going to get him set up for a biopsy and reviewed the risks of bleeding, infection and voiding difficulty. I will send antibiotics based on his urine culture today and will repeat a UA and culture a week prior to the biopsy. I will repeat his PSA today along with a testosterone since he is on chronic narcotics and has some testicular atrophy. , - 2019 Incomplete bladder emptying, His PVR is 290 ml today. I discussed double voiding. - 2019 Prostate nodule w/ LUTS, He has a long history of voiding difficulty and remains on tamsulosin. He will need a flowrate and cystoscopy at some point. - 2019 Bladder Diverticulum, Diverticulum, bladder acquired - 2014 Bladder, Neuromuscular dysfunction, Unspec, Neurogenic bladder - 2014 Obstructive and reflux uropathy, Unspec, Obstructive uropathy - 2014 Personal Hx Oth male genital organs diseases, History of epididymitis - 2014 Urinary Tract Inf, Unspec site, Urinary tract infection - 2014      PMH Notes: Hiatal hernia.  lung cancer  Unspecified autoimmune disease.    NON-GU PMH: Encounter for attention to other artificial openings of urinary tract (Worsening) - 04/18/2018 Anxiety Arthritis Duodenal ulcer, unspecified as acute or chronic, without hemorrhage or perforation Encounter for general adult medical examination without abnormal findings, Encounter for preventive health examination GERD Hypertension Other intervertebral disc degeneration, lumbosacral region Type 2 diabetes mellitus with diabetic neuropathy,  unspecified    FAMILY HISTORY: Death In The Family Father - Runs In Family Death In The Family Mother - Runs In Family Family Health Status Number - Runs In Family   SOCIAL HISTORY: Marital Status: Widowed Preferred Language: English; Ethnicity: Not Hispanic Or Latino; Race: Black or African American Current Smoking Status: Patient does not smoke anymore. Has not smoked since 04/26/2013. Smoked for 30 years. Smoked 1/2 pack per day.   Tobacco Use Assessment Completed: Used Tobacco in last 30 days? Has  never drank.  Drinks 2 caffeinated drinks per day. Patient's occupation is/was retired.    REVIEW OF SYSTEMS:    GU Review Male:   Patient denies erection problems, get up at night to urinate, leakage of urine, penile pain, frequent urination, burning/ pain with urination, have to strain to urinate , hard to postpone urination, trouble starting your stream, and stream starts and stops.  Gastrointestinal (Upper):   Patient denies nausea, vomiting, and indigestion/ heartburn.  Gastrointestinal (Lower):   Patient denies diarrhea and constipation.  Constitutional:   Patient denies fever, night sweats, weight loss, and fatigue.  Skin:   Patient denies skin rash/ lesion and itching.  Eyes:   Patient denies blurred vision and double vision.  Ears/ Nose/ Throat:   Patient denies sore throat and sinus problems.  Hematologic/Lymphatic:   Patient denies swollen glands and easy bruising.  Cardiovascular:   Patient denies leg swelling and chest pains.  Respiratory:   Patient denies cough and shortness of breath.  Endocrine:   Patient denies excessive thirst.  Musculoskeletal:   Patient denies back pain and joint pain.  Neurological:   Patient denies headaches and dizziness.  Psychologic:   Patient denies depression and anxiety.   Notes: Hematuria    VITAL SIGNS:      01/23/2019 02:36 PM  Weight 249 lb / 112.94 kg  Height 80 in / 203.2 cm  BP 114/73 mmHg  Pulse 93 /min  Temperature 97.2 F /  36.2 C  BMI 27.4 kg/m   MULTI-SYSTEM PHYSICAL EXAMINATION:    Constitutional: Well-nourished. No physical deformities. Normally developed. Good grooming.  Neck: Neck symmetrical, not swollen. Normal tracheal position.  Respiratory: Normal breath sounds. No labored breathing, no use of accessory muscles.   Cardiovascular: Regular rate and rhythm. No murmur, no gallop. Normal temperature, normal extremity pulses, no swelling, no varicosities.   Lymphatic: No enlargement, no tenderness of supraclavicular and neck lymph nodes.     PAST DATA REVIEWED:  Source Of History:  Patient   11/21/18 02/07/18 07/26/17 06/26/17  PSA  Total PSA < 0.1 ng/dl 0.1 ng/dl 3.3 ng/dl 15.8 ng/dl    11/21/18 02/07/18 07/26/17 06/26/17  Hormones  Testosterone, Total 22 pg/dL 14 pg/dL 31 pg/dL 304 pg/dL    PROCEDURES:         Simple Change SP Tube - 51705  The patient's indwelling SP tube was carefully removed. A 18 French Foley catheter was inserted into the bladder using sterile technique. The patient was taught routine catheter care. Hand irrigation of the bladder with sterile water was performed. A leg bag was connected. A urine culture was sent to the lab. A urinalysis was sent to the lab.   ASSESSMENT:      ICD-10 Details  1 GU:   Urinary Retention - R33.8 He continues to do poorly with the SP tube with spasms and hematuria. he is currently on nitrofurantoin and the urine is clear.   2   Prostate nodule w/ LUTS - N40.3 He is scheduled for a TURP on 02/03/19 which hopefully will allow him to get catheter free.   3   Chronic cystitis (with hematuria) - N30.21 Urine cutlure today.   4   History of prostate cancer - Z85.46    PLAN:           Document Letter(s):  Created for Patient: Clinical Summary         Next Appointment:      Next Appointment: 01/30/2019 08:45 AM  Appointment Type: Nurse Visit    Location: [facility] [id number]    Provider: [facility]    Reason for Visit: 4 week cath change

## 2019-02-02 NOTE — Anesthesia Preprocedure Evaluation (Addendum)
Anesthesia Evaluation  Patient identified by MRN, date of birth, ID band Patient awake    Reviewed: Allergy & Precautions, NPO status , Patient's Chart, lab work & pertinent test results  Airway Mallampati: I       Dental  (+) Poor Dentition, Missing   Pulmonary former smoker,    Pulmonary exam normal breath sounds clear to auscultation       Cardiovascular hypertension, Pt. on medications and Pt. on home beta blockers Normal cardiovascular exam Rhythm:Regular Rate:Normal     Neuro/Psych PSYCHIATRIC DISORDERS Depression    GI/Hepatic GERD  ,  Endo/Other  diabetes  Renal/GU      Musculoskeletal   Abdominal Normal abdominal exam  (+)   Peds  Hematology   Anesthesia Other Findings   Reproductive/Obstetrics                                                             Anesthesia Evaluation  Patient identified by MRN, date of birth, ID band Patient awake    Reviewed: Allergy & Precautions, H&P , NPO status , Patient's Chart, lab work & pertinent test results, reviewed documented beta blocker date and time   Airway Mallampati: II  TM Distance: >3 FB Neck ROM: full    Dental no notable dental hx. (+) Poor Dentition, Missing   Pulmonary shortness of breath, COPD, former smoker,    Pulmonary exam normal breath sounds clear to auscultation       Cardiovascular Exercise Tolerance: Good hypertension, negative cardio ROS   Rhythm:regular Rate:Normal     Neuro/Psych PSYCHIATRIC DISORDERS Depression  Neuromuscular disease    GI/Hepatic Neg liver ROS, hiatal hernia, GERD  ,  Endo/Other  negative endocrine ROSdiabetes  Renal/GU Renal disease  negative genitourinary   Musculoskeletal   Abdominal   Peds  Hematology negative hematology ROS (+)   Anesthesia Other Findings   Reproductive/Obstetrics negative OB ROS                              Anesthesia Physical Anesthesia Plan  ASA: III  Anesthesia Plan: General   Post-op Pain Management:    Induction:   PONV Risk Score and Plan:   Airway Management Planned:   Additional Equipment:   Intra-op Plan:   Post-operative Plan:   Informed Consent: I have reviewed the patients History and Physical, chart, labs and discussed the procedure including the risks, benefits and alternatives for the proposed anesthesia with the patient or authorized representative who has indicated his/her understanding and acceptance.     Dental Advisory Given  Plan Discussed with: CRNA  Anesthesia Plan Comments:         Anesthesia Quick Evaluation  Anesthesia Physical Anesthesia Plan  ASA: III  Anesthesia Plan: General   Post-op Pain Management:    Induction: Intravenous  PONV Risk Score and Plan: 3 and Ondansetron and Dexamethasone  Airway Management Planned: LMA  Additional Equipment:   Intra-op Plan:   Post-operative Plan: Extubation in OR  Informed Consent: I have reviewed the patients History and Physical, chart, labs and discussed the procedure including the risks, benefits and alternatives for the proposed anesthesia with the patient or authorized representative who has indicated his/her understanding and acceptance.     Dental advisory given  Plan Discussed with: CRNA  Anesthesia Plan Comments:        Anesthesia Quick Evaluation

## 2019-02-03 ENCOUNTER — Encounter (HOSPITAL_COMMUNITY): Payer: Self-pay | Admitting: Emergency Medicine

## 2019-02-03 ENCOUNTER — Other Ambulatory Visit: Payer: Self-pay

## 2019-02-03 ENCOUNTER — Ambulatory Visit (HOSPITAL_COMMUNITY): Payer: Medicare Other | Admitting: Physician Assistant

## 2019-02-03 ENCOUNTER — Observation Stay (HOSPITAL_COMMUNITY)
Admission: RE | Admit: 2019-02-03 | Discharge: 2019-02-04 | Disposition: A | Discharge status: Home / Self Care | Payer: Medicare Other | Attending: Urology | Admitting: Urology

## 2019-02-03 ENCOUNTER — Encounter (HOSPITAL_COMMUNITY)
Admission: RE | Disposition: A | Discharge status: Home / Self Care | Payer: Self-pay | Source: Home / Self Care | Attending: Urology

## 2019-02-03 ENCOUNTER — Ambulatory Visit (HOSPITAL_COMMUNITY): Payer: Medicare Other | Admitting: Anesthesiology

## 2019-02-03 DIAGNOSIS — Z923 Personal history of irradiation: Secondary | ICD-10-CM | POA: Insufficient documentation

## 2019-02-03 DIAGNOSIS — K5900 Constipation, unspecified: Secondary | ICD-10-CM | POA: Diagnosis not present

## 2019-02-03 DIAGNOSIS — I4891 Unspecified atrial fibrillation: Secondary | ICD-10-CM | POA: Insufficient documentation

## 2019-02-03 DIAGNOSIS — Z87891 Personal history of nicotine dependence: Secondary | ICD-10-CM | POA: Insufficient documentation

## 2019-02-03 DIAGNOSIS — Z794 Long term (current) use of insulin: Secondary | ICD-10-CM | POA: Insufficient documentation

## 2019-02-03 DIAGNOSIS — Z86718 Personal history of other venous thrombosis and embolism: Secondary | ICD-10-CM | POA: Insufficient documentation

## 2019-02-03 DIAGNOSIS — N529 Male erectile dysfunction, unspecified: Secondary | ICD-10-CM | POA: Diagnosis not present

## 2019-02-03 DIAGNOSIS — C61 Malignant neoplasm of prostate: Secondary | ICD-10-CM | POA: Diagnosis present

## 2019-02-03 DIAGNOSIS — N403 Nodular prostate with lower urinary tract symptoms: Secondary | ICD-10-CM | POA: Diagnosis present

## 2019-02-03 DIAGNOSIS — M109 Gout, unspecified: Secondary | ICD-10-CM | POA: Diagnosis not present

## 2019-02-03 DIAGNOSIS — M19042 Primary osteoarthritis, left hand: Secondary | ICD-10-CM | POA: Diagnosis not present

## 2019-02-03 DIAGNOSIS — M19041 Primary osteoarthritis, right hand: Secondary | ICD-10-CM | POA: Insufficient documentation

## 2019-02-03 DIAGNOSIS — Z8719 Personal history of other diseases of the digestive system: Secondary | ICD-10-CM | POA: Insufficient documentation

## 2019-02-03 DIAGNOSIS — Z881 Allergy status to other antibiotic agents status: Secondary | ICD-10-CM | POA: Diagnosis not present

## 2019-02-03 DIAGNOSIS — R338 Other retention of urine: Secondary | ICD-10-CM | POA: Insufficient documentation

## 2019-02-03 DIAGNOSIS — N3021 Other chronic cystitis with hematuria: Secondary | ICD-10-CM | POA: Diagnosis not present

## 2019-02-03 DIAGNOSIS — Z79899 Other long term (current) drug therapy: Secondary | ICD-10-CM | POA: Diagnosis not present

## 2019-02-03 DIAGNOSIS — N401 Enlarged prostate with lower urinary tract symptoms: Principal | ICD-10-CM | POA: Insufficient documentation

## 2019-02-03 DIAGNOSIS — K219 Gastro-esophageal reflux disease without esophagitis: Secondary | ICD-10-CM | POA: Insufficient documentation

## 2019-02-03 DIAGNOSIS — I10 Essential (primary) hypertension: Secondary | ICD-10-CM | POA: Diagnosis not present

## 2019-02-03 DIAGNOSIS — Z8546 Personal history of malignant neoplasm of prostate: Secondary | ICD-10-CM | POA: Diagnosis not present

## 2019-02-03 DIAGNOSIS — R339 Retention of urine, unspecified: Secondary | ICD-10-CM | POA: Diagnosis present

## 2019-02-03 DIAGNOSIS — N138 Other obstructive and reflux uropathy: Secondary | ICD-10-CM | POA: Diagnosis not present

## 2019-02-03 DIAGNOSIS — E1140 Type 2 diabetes mellitus with diabetic neuropathy, unspecified: Secondary | ICD-10-CM | POA: Diagnosis not present

## 2019-02-03 DIAGNOSIS — N320 Bladder-neck obstruction: Secondary | ICD-10-CM | POA: Diagnosis not present

## 2019-02-03 DIAGNOSIS — N400 Enlarged prostate without lower urinary tract symptoms: Secondary | ICD-10-CM | POA: Diagnosis not present

## 2019-02-03 HISTORY — PX: TRANSURETHRAL RESECTION OF PROSTATE: SHX73

## 2019-02-03 LAB — GENTAMICIN LEVEL, RANDOM: Gentamicin Rm: 3.6 ug/mL

## 2019-02-03 LAB — GLUCOSE, CAPILLARY
Glucose-Capillary: 103 mg/dL — ABNORMAL HIGH (ref 70–99)
Glucose-Capillary: 124 mg/dL — ABNORMAL HIGH (ref 70–99)
Glucose-Capillary: 197 mg/dL — ABNORMAL HIGH (ref 70–99)
Glucose-Capillary: 218 mg/dL — ABNORMAL HIGH (ref 70–99)

## 2019-02-03 SURGERY — TURP (TRANSURETHRAL RESECTION OF PROSTATE)
Anesthesia: General

## 2019-02-03 MED ORDER — ATENOLOL-CHLORTHALIDONE 50-25 MG PO TABS: 1.0000 | ORAL_TABLET | Freq: Every day | ORAL | Status: DC

## 2019-02-03 MED ORDER — OXYCODONE HCL 5 MG PO TABS
10.0000 mg | ORAL_TABLET | Freq: Three times a day (TID) | ORAL | Status: DC
  Filled 2019-02-03 (×2): qty 2

## 2019-02-03 MED ORDER — LIDOCAINE HCL (CARDIAC) PF 100 MG/5ML IV SOSY
PREFILLED_SYRINGE | INTRAVENOUS | Status: DC | PRN
  Administered 2019-02-03: 100 mg via INTRAVENOUS

## 2019-02-03 MED ORDER — CHLORHEXIDINE GLUCONATE 0.12 % MT SOLN
15.0000 mL | Freq: Two times a day (BID) | OROMUCOSAL | Status: DC
  Administered 2019-02-03: 22:00:00 15 mL via OROMUCOSAL
  Filled 2019-02-03: qty 15

## 2019-02-03 MED ORDER — SODIUM CHLORIDE 0.9 % IV SOLN
2.0000 g | INTRAVENOUS | Status: AC
  Administered 2019-02-03: 2 g via INTRAVENOUS
  Filled 2019-02-03: qty 20

## 2019-02-03 MED ORDER — TIMOLOL MALEATE 0.5 % OP SOLN
1.0000 [drp] | Freq: Every day | OPHTHALMIC | Status: DC
  Administered 2019-02-03: 22:00:00 1 [drp] via OPHTHALMIC
  Filled 2019-02-03: qty 5

## 2019-02-03 MED ORDER — SODIUM CHLORIDE 0.9 % IV SOLN
2.0000 g | INTRAVENOUS | Status: DC
  Administered 2019-02-04: 08:00:00 2 g via INTRAVENOUS
  Filled 2019-02-03: qty 2

## 2019-02-03 MED ORDER — FENTANYL CITRATE (PF) 100 MCG/2ML IJ SOLN
INTRAMUSCULAR | Status: AC
  Filled 2019-02-03: qty 2

## 2019-02-03 MED ORDER — INSULIN ASPART 100 UNIT/ML ~~LOC~~ SOLN
0.0000 [IU] | Freq: Three times a day (TID) | SUBCUTANEOUS | Status: DC
  Administered 2019-02-03: 4 [IU] via SUBCUTANEOUS

## 2019-02-03 MED ORDER — GABAPENTIN 400 MG PO CAPS
400.0000 mg | ORAL_CAPSULE | Freq: Every day | ORAL | Status: DC
  Administered 2019-02-03: 22:00:00 400 mg via ORAL
  Filled 2019-02-03: qty 1

## 2019-02-03 MED ORDER — SODIUM CHLORIDE 0.9 % IR SOLN
3000.0000 mL | Status: DC
  Administered 2019-02-03: 12:00:00 3000 mL

## 2019-02-03 MED ORDER — 0.9 % SODIUM CHLORIDE (POUR BTL) OPTIME
TOPICAL | Status: DC | PRN
  Administered 2019-02-03: 1000 mL

## 2019-02-03 MED ORDER — KETOROLAC TROMETHAMINE 0.4 % OP SOLN: 1.0000 [drp] | Freq: Two times a day (BID) | OPHTHALMIC | Status: DC

## 2019-02-03 MED ORDER — STERILE WATER FOR IRRIGATION IR SOLN
Status: DC | PRN
  Administered 2019-02-03: 500 mL

## 2019-02-03 MED ORDER — SODIUM CHLORIDE 0.9 % IV SOLN: 1.0000 g | INTRAVENOUS | Status: DC

## 2019-02-03 MED ORDER — DEXAMETHASONE SODIUM PHOSPHATE 10 MG/ML IJ SOLN
INTRAMUSCULAR | Status: AC
  Filled 2019-02-03: qty 1

## 2019-02-03 MED ORDER — CHLORTHALIDONE 25 MG PO TABS
25.0000 mg | ORAL_TABLET | Freq: Every day | ORAL | Status: DC
  Filled 2019-02-03 (×2): qty 1

## 2019-02-03 MED ORDER — ATENOLOL 50 MG PO TABS
50.0000 mg | ORAL_TABLET | Freq: Every day | ORAL | Status: DC
  Filled 2019-02-03: qty 1

## 2019-02-03 MED ORDER — LACTATED RINGERS IV SOLN
INTRAVENOUS | Status: DC
  Administered 2019-02-03: 07:00:00 via INTRAVENOUS

## 2019-02-03 MED ORDER — PROPOFOL 10 MG/ML IV BOLUS
INTRAVENOUS | Status: DC | PRN
  Administered 2019-02-03: 150 mg via INTRAVENOUS

## 2019-02-03 MED ORDER — ONDANSETRON HCL 4 MG/2ML IJ SOLN: 4.0000 mg | INTRAMUSCULAR | Status: DC | PRN

## 2019-02-03 MED ORDER — GENTAMICIN SULFATE 40 MG/ML IJ SOLN
520.0000 mg | INTRAVENOUS | Status: DC
  Filled 2019-02-03: qty 13

## 2019-02-03 MED ORDER — BISACODYL 10 MG RE SUPP: 10.0000 mg | Freq: Every day | RECTAL | Status: DC | PRN

## 2019-02-03 MED ORDER — LIDOCAINE 2% (20 MG/ML) 5 ML SYRINGE
INTRAMUSCULAR | Status: AC
  Filled 2019-02-03: qty 5

## 2019-02-03 MED ORDER — SODIUM CHLORIDE 0.9 % IR SOLN
Status: DC | PRN
  Administered 2019-02-03: 6000 mL via INTRAVESICAL

## 2019-02-03 MED ORDER — POTASSIUM CHLORIDE IN NACL 20-0.45 MEQ/L-% IV SOLN
INTRAVENOUS | Status: DC
  Administered 2019-02-03 (×2): via INTRAVENOUS
  Filled 2019-02-03 (×3): qty 1000

## 2019-02-03 MED ORDER — PROMETHAZINE HCL 25 MG/ML IJ SOLN: 6.2500 mg | INTRAMUSCULAR | Status: DC | PRN

## 2019-02-03 MED ORDER — DEXAMETHASONE SODIUM PHOSPHATE 10 MG/ML IJ SOLN
INTRAMUSCULAR | Status: DC | PRN
  Administered 2019-02-03: 10 mg via INTRAVENOUS

## 2019-02-03 MED ORDER — NICOTINE POLACRILEX 2 MG MT GUM: 4.0000 mg | CHEWING_GUM | OROMUCOSAL | Status: DC | PRN

## 2019-02-03 MED ORDER — PROPOFOL 10 MG/ML IV BOLUS
INTRAVENOUS | Status: AC
  Filled 2019-02-03: qty 40

## 2019-02-03 MED ORDER — HYDROMORPHONE HCL 1 MG/ML IJ SOLN
0.5000 mg | INTRAMUSCULAR | Status: DC | PRN
  Administered 2019-02-04: 05:00:00 0.5 mg via INTRAVENOUS
  Filled 2019-02-03: qty 1

## 2019-02-03 MED ORDER — FLEET ENEMA 7-19 GM/118ML RE ENEM: 1.0000 | ENEMA | Freq: Once | RECTAL | Status: DC | PRN

## 2019-02-03 MED ORDER — EPHEDRINE SULFATE-NACL 50-0.9 MG/10ML-% IV SOSY
PREFILLED_SYRINGE | INTRAVENOUS | Status: DC | PRN
  Administered 2019-02-03: 10 mg via INTRAVENOUS
  Administered 2019-02-03: 5 mg via INTRAVENOUS
  Administered 2019-02-03: 10 mg via INTRAVENOUS

## 2019-02-03 MED ORDER — FENTANYL CITRATE (PF) 100 MCG/2ML IJ SOLN
INTRAMUSCULAR | Status: DC | PRN
  Administered 2019-02-03 (×2): 50 ug via INTRAVENOUS

## 2019-02-03 MED ORDER — HYDROMORPHONE HCL 1 MG/ML IJ SOLN
INTRAMUSCULAR | Status: AC
  Filled 2019-02-03: qty 1

## 2019-02-03 MED ORDER — BELLADONNA ALKALOIDS-OPIUM 16.2-60 MG RE SUPP: 30.0000 mg | Freq: Four times a day (QID) | RECTAL | Status: DC | PRN

## 2019-02-03 MED ORDER — HYDROMORPHONE HCL 1 MG/ML IJ SOLN: 0.2500 mg | INTRAMUSCULAR | Status: DC | PRN

## 2019-02-03 MED ORDER — ACETAMINOPHEN 325 MG PO TABS: 650.0000 mg | ORAL_TABLET | ORAL | Status: DC | PRN

## 2019-02-03 MED ORDER — ONDANSETRON HCL 4 MG/2ML IJ SOLN
INTRAMUSCULAR | Status: AC
  Filled 2019-02-03: qty 2

## 2019-02-03 MED ORDER — ONDANSETRON HCL 4 MG/2ML IJ SOLN
INTRAMUSCULAR | Status: DC | PRN
  Administered 2019-02-03: 4 mg via INTRAVENOUS

## 2019-02-03 MED ORDER — MEPERIDINE HCL 50 MG/ML IJ SOLN: 6.2500 mg | INTRAMUSCULAR | Status: DC | PRN

## 2019-02-03 MED ORDER — SENNOSIDES-DOCUSATE SODIUM 8.6-50 MG PO TABS: 1.0000 | ORAL_TABLET | Freq: Every evening | ORAL | Status: DC | PRN

## 2019-02-03 MED ORDER — LINACLOTIDE 72 MCG PO CAPS: 72.0000 ug | ORAL_CAPSULE | Freq: Every day | ORAL | Status: DC | PRN

## 2019-02-03 MED ORDER — ACETAMINOPHEN 10 MG/ML IV SOLN: 1000.0000 mg | Freq: Once | INTRAVENOUS | Status: DC | PRN

## 2019-02-03 SURGICAL SUPPLY — 18 items
BAG DRN RND TRDRP ANRFLXCHMBR (UROLOGICAL SUPPLIES) ×1
BAG URINE DRAIN 2000ML AR STRL (UROLOGICAL SUPPLIES) ×3 IMPLANT
BAG URO CATCHER STRL LF (MISCELLANEOUS) ×3 IMPLANT
CATH FOLEY 2WAY SLVR 30CC 22FR (CATHETERS) ×3 IMPLANT
CATH FOLEY 3WAY 30CC 22FR (CATHETERS) IMPLANT
ELECT REM PT RETURN 15FT ADLT (MISCELLANEOUS) ×3 IMPLANT
GLOVE SURG SS PI 8.0 STRL IVOR (GLOVE) IMPLANT
GOWN STRL REUS W/TWL XL LVL3 (GOWN DISPOSABLE) ×3 IMPLANT
HOLDER FOLEY CATH W/STRAP (MISCELLANEOUS) IMPLANT
KIT TURNOVER KIT A (KITS) IMPLANT
LOOP CUT BIPOLAR 24F LRG (ELECTROSURGICAL) ×2 IMPLANT
MANIFOLD NEPTUNE II (INSTRUMENTS) ×3 IMPLANT
PACK CYSTO (CUSTOM PROCEDURE TRAY) ×3 IMPLANT
SYR 30ML LL (SYRINGE) ×2 IMPLANT
SYRINGE IRR TOOMEY STRL 70CC (SYRINGE) ×3 IMPLANT
TUBING CONNECTING 10 (TUBING) ×2 IMPLANT
TUBING CONNECTING 10' (TUBING) ×1
TUBING UROLOGY SET (TUBING) ×3 IMPLANT

## 2019-02-03 NOTE — Op Note (Signed)
Procedure: Transurethral resection of the prostate.  Preop diagnosis: Urinary retention.  Postop diagnosis: Same.  Surgeon: Dr. Irine Seal.  Anesthesia: General.  Specimen: Prostate chips.  Drains: 47 French Foley catheter and suprapubic tube.  EBL: 10 mL.  Complications: None.  Indications: The patient is a 78 year old male with a history of prostate cancer with prior radiation therapy and ADT with subsequent urinary retention who has been managed with chronic suprapubic tube drainage.  He is now just over a year out from radiation therapy and is felt that it is safe to do a TURP to try to relieve the obstruction.  Procedure: He was given Rocephin and gentamicin.  He was taken operating room where general anesthetic was induced.  He was placed in lithotomy position and fitted with PAS hose.  His perineum and genitalia were prepped with Betadine solution and he was draped in the usual sterile fashion.  Cystoscopy was performed using a 23 [place] scope and 30 degree lens.  Examination revealed a normal urethra.  The external sphincter was intact.  The prostatic urethra was approximately 3 cm in length with a high bladder neck and some lateral lobe enlargement with obstruction.  Examination of bladder revealed moderate severe trabeculation with small diverticuli at the base bilaterally.  There was mucosal erythema from the chronic suprapubic tube and a small stone was noted which was evacuated through the cystoscope.  The ureteral orifices were unremarkable.  The cystoscope was replaced with a 26 French continuous-flow resectoscope sheath.  This was fitted with an Beatrix Fetters handle with a bipolar loop and a 30 degree lens.  Saline was used to the irrigant.  The prostate was then resected beginning at the bladder neck where the fibers were exposed from 5-7 o'clock.  The floor the prostate was then resected out to alongside the verumontanum.  Because of the prior radiation there was minimal bleeding  associated with resection.  The left lobe of the prostate was resected from bladder neck to apex followed by the right lobe.  Some residual apical and anterior tissue was then resected.  The chips were then removed.  Hemostasis was achieved.  Final inspection revealed an excellent channel with no active bleeding and no retained tissue.  The scope was removed and pressure on the bladder produced an excellent stream.  A 20 French Foley catheter was inserted and the balloon was filled with 30 cc of sterile fluid.  The patient was placed to continuous irrigation coming in through the Foley and out through the suprapubic tube.  He was taken down from the lithotomy position, his anesthetic was reversed and he was moved recovery in stable condition.  There were no complications.

## 2019-02-03 NOTE — Transfer of Care (Signed)
Immediate Anesthesia Transfer of Care Note  Patient: Jonathan [name]  Procedure(s) Performed: TRANSURETHRAL RESECTION OF THE PROSTATE (TURP) (N/A )  Patient Location: PACU  Anesthesia Type:General  Level of Consciousness: awake, alert  and oriented  Airway & Oxygen Therapy: Patient Spontanous Breathing and Patient connected to face mask oxygen  Post-op Assessment: Report given to RN and Post -op Vital signs reviewed and stable  Post vital signs: Reviewed and stable  Last Vitals:  Vitals Value Taken Time  BP 148/118 02/03/19 0815  Temp    Pulse 65 02/03/19 0816  Resp 14 02/03/19 0816  SpO2 100 % 02/03/19 0816  Vitals shown include unvalidated device data.  Last Pain:  Vitals:   02/03/19 0621  TempSrc:   PainSc: 0-No pain      Patients Stated Pain Goal: 4 (78/67/67 2094)  Complications: No apparent anesthesia complications

## 2019-02-03 NOTE — Progress Notes (Signed)
Pharmacy Antibiotic Note  Jonathan [name] is a 78 y.o. male admitted on 02/03/2019 with urinary retention and UTI d/t BPH. Urine culture drawn at office 11/9 but not visible in Epic. Now s/p TURP this AM; given gentamicin x 1 and started on Rocephin Pre-op. Pharmacy has been consulted to continue gentamicin dosing.  Plan:  Gentamicin 5 mg/kg ABW IV q24 hr  F/u 10-hr gentamicin random level; adjust dose per Delphia Grates & Craig nomogram  Rocephin per MD; will adjust to 2 mg daily with weight > 100 kg  Height: 6\' 8"  (203.2 cm) Weight: 249 lb (112.9 kg) IBW/kg (Calculated) : 96  Temp (24hrs), Avg:98 F (36.7 C), Min:97.4 F (36.3 C), Max:98.4 F (36.9 C)  Recent Labs  Lab 01/30/19 1003  WBC 5.0  CREATININE 1.28*    Estimated Creatinine Clearance: 66.7 mL/min (A) (by C-G formula based on SCr of 1.28 mg/dL (H)).    Allergies  Allergen Reactions  . Ciprofloxacin Other (See Comments)    Doesn't work  . Levaquin [Levofloxacin] Other (See Comments)    Doesn't work    Antimicrobials this admission: 11/10 gentamicin >>  11/10 Rocephin >>   Dose adjustments this admission: Gentamicin level due 11/10 PM  Microbiology results: None available in Epic  Thank you for allowing pharmacy to be a part of this patient's care.  Reuel Boom, PharmD, BCPS [phone] 02/03/2019, 4:38 PM

## 2019-02-03 NOTE — Interval H&P Note (Signed)
No change in history.  No new complaints.

## 2019-02-03 NOTE — Discharge Instructions (Signed)
Transurethral Resection of the Prostate, Care After This sheet gives you information about how to care for yourself after your procedure. Your health care provider may also give you more specific instructions. If you have problems or questions, contact your health care provider. What can I expect after the procedure? After the procedure, it is common to have:  Mild pain in your lower abdomen.  Soreness or mild discomfort in your penis from having the catheter inserted during the procedure.  A feeling of urgency when you need to urinate.  A small amount of blood in your urine. You may notice some small blood clots in your urine. These are normal. Follow these instructions at home: Medicines  Take over-the-counter and prescription medicines only as told by your health care provider.  If you were prescribed an antibiotic medicine, take it as told by your health care provider. Do not stop taking the antibiotic even if you start to feel better.  Ask your health care provider if the medicine prescribed to you: ? Requires you to avoid driving or using heavy machinery. ? Can cause constipation. You may need to take actions to prevent or treat constipation, such as:  Take over-the-counter or prescription medicines.  Eat foods that are high in fiber, such as fresh fruits and vegetables, whole grains, and beans.  Limit foods that are high in fat and processed sugars, such as fried or sweet foods.  Do not drive for 24 hours if you were given a sedative during your procedure. Activity   Return to your normal activities as told by your health care provider. Ask your health care provider what activities are safe for you.  Do not lift anything that is heavier than 10 lb (4.5 kg), or the limit that you are told, for 3 weeks after the procedure or until your health care provider says that it is safe.  Avoid intense physical activity for as long as told by your health care provider.  Avoid  sitting for a long time without moving. Get up and move around one or more times every few hours. This helps to prevent blood clots. You may increase your physical activity gradually as you start to feel better. Lifestyle  Do not drink alcohol for as long as told by your health care provider. This is especially important if you are taking prescription pain medicines.  Do not engage in sexual activity until your health care provider says that you can do this. General instructions   Do not take baths, swim, or use a hot tub until your health care provider approves.  Drink enough fluid to keep your urine pale yellow.  Urinate as soon as you feel the need to. Do not try to hold your urine for long periods of time.  If your health care provider approves, you may take a stool softener for 2-3 weeks to prevent you from straining to have a bowel movement.  Wear compression stockings as told by your health care provider. These stockings help to prevent blood clots and reduce swelling in your legs.  Keep all follow-up visits as told by your health care provider. This is important. Contact a health care provider if you have:  Difficulty urinating.  A fever.  Pain that gets worse or does not improve with medicine.  Blood in your urine that does not go away after 1 week of resting and drinking more fluids.  Swelling in your penis or testicles. Get help right away if:  You are unable  to urinate.  You are having more blood clots in your urine instead of fewer.  You have: ? Large blood clots. ? A lot of blood in your urine. ? Pain in your back or lower abdomen. ? Pain or swelling in your legs. ? Chills and you are shaking. ? Difficulty breathing or shortness of breath. Summary  After the procedure, it is common to have a small amount of blood in your urine.  Avoid heavy lifting and intense physical activity for as long as told by your health care provider.  Urinate as soon as you  feel the need to. Do not try to hold your urine for long periods of time.  Keep all follow-up visits as told by your health care provider. This is important. This information is not intended to replace advice given to you by your health care provider. Make sure you discuss any questions you have with your health care provider. Document Released: 03/12/2005 Document Revised: 07/02/2018 Document Reviewed: 12/11/2017 Elsevier Patient Education  2020 [employer].

## 2019-02-03 NOTE — Anesthesia Procedure Notes (Signed)
Procedure Name: LMA Insertion Date/Time: 02/03/2019 7:28 AM Performed by: Raenette Rover, CRNA Pre-anesthesia Checklist: Patient identified, Emergency Drugs available, Suction available and Patient being monitored Patient Re-evaluated:Patient Re-evaluated prior to induction Oxygen Delivery Method: Circle system utilized Preoxygenation: Pre-oxygenation with 100% oxygen Induction Type: IV induction LMA: LMA inserted LMA Size: 5.0 Number of attempts: 1 Placement Confirmation: positive ETCO2 and breath sounds checked- equal and bilateral Tube secured with: Tape Dental Injury: Teeth and Oropharynx as per pre-operative assessment

## 2019-02-04 ENCOUNTER — Ambulatory Visit (INDEPENDENT_AMBULATORY_CARE_PROVIDER_SITE_OTHER): Payer: Medicare Other | Admitting: Nurse Practitioner

## 2019-02-04 DIAGNOSIS — K219 Gastro-esophageal reflux disease without esophagitis: Secondary | ICD-10-CM | POA: Diagnosis not present

## 2019-02-04 DIAGNOSIS — R338 Other retention of urine: Secondary | ICD-10-CM | POA: Diagnosis not present

## 2019-02-04 DIAGNOSIS — N401 Enlarged prostate with lower urinary tract symptoms: Secondary | ICD-10-CM | POA: Diagnosis not present

## 2019-02-04 DIAGNOSIS — Z8546 Personal history of malignant neoplasm of prostate: Secondary | ICD-10-CM | POA: Diagnosis not present

## 2019-02-04 DIAGNOSIS — N3021 Other chronic cystitis with hematuria: Secondary | ICD-10-CM | POA: Diagnosis not present

## 2019-02-04 DIAGNOSIS — Z923 Personal history of irradiation: Secondary | ICD-10-CM | POA: Diagnosis not present

## 2019-02-04 LAB — SURGICAL PATHOLOGY

## 2019-02-04 LAB — GLUCOSE, CAPILLARY: Glucose-Capillary: 107 mg/dL — ABNORMAL HIGH (ref 70–99)

## 2019-02-04 MED ORDER — CHLORHEXIDINE GLUCONATE CLOTH 2 % EX PADS: 6.0000 | MEDICATED_PAD | Freq: Every day | CUTANEOUS | Status: DC

## 2019-02-04 MED ORDER — GENTAMICIN SULFATE 40 MG/ML IJ SOLN: 520.0000 mg | INTRAVENOUS | Status: DC

## 2019-02-04 NOTE — Anesthesia Postprocedure Evaluation (Signed)
Anesthesia Post Note  Patient: Jonathan [name]  Procedure(s) Performed: TRANSURETHRAL RESECTION OF THE PROSTATE (TURP) (N/A )     Patient location during evaluation: PACU Anesthesia Type: General Level of consciousness: sedated Pain management: pain level controlled Vital Signs Assessment: post-procedure vital signs reviewed and stable Respiratory status: spontaneous breathing Cardiovascular status: stable Postop Assessment: no apparent nausea or vomiting Anesthetic complications: no    Last Vitals:  Vitals:   02/03/19 2216 02/04/19 0448  BP: 125/80 130/84  Pulse: 74 71  Resp: 20 20  Temp: 36.5 C 36.8 C  SpO2: 96% 99%    Last Pain:  Vitals:   02/04/19 0815  TempSrc:   PainSc: 0-No pain   Pain Goal: Patients Stated Pain Goal: 4 (02/03/19 0621)                 Huston Foley

## 2019-02-04 NOTE — Progress Notes (Signed)
Pharmacy Antibiotic Note  Jonathan [name] is a 78 y.o. male admitted on 02/03/2019 with urinary retention and UTI d/t BPH. Urine culture drawn at office 11/9 but not visible in Epic. Now s/p TURP this AM; given gentamicin x 1 and started on Rocephin Pre-op. Pharmacy has been consulted to continue gentamicin dosing.  Level drawn 11.5h after 1st dose= 3.6 which suggests patient needs q36h dosing per UC nomogram  Plan:  Change Gentamicin 5 mg/kg ABW IV q36 hr  F/u renal function and consider additional trough levels if any change in renal function  Rocephin per MD; will adjust to 2 mg daily with weight > 100 kg  Height: 6\' 8"  (203.2 cm) Weight: 249 lb (112.9 kg) IBW/kg (Calculated) : 96  Temp (24hrs), Avg:97.9 F (36.6 C), Min:97.4 F (36.3 C), Max:98.4 F (36.9 C)  Recent Labs  Lab 01/30/19 1003 02/03/19 1907  WBC 5.0  --   CREATININE 1.28*  --   GENTRANDOM  --  3.6    Estimated Creatinine Clearance: 66.7 mL/min (A) (by C-G formula based on SCr of 1.28 mg/dL (H)).    Allergies  Allergen Reactions  . Ciprofloxacin Other (See Comments)    Doesn't work  . Levaquin [Levofloxacin] Other (See Comments)    Doesn't work    Antimicrobials this admission: 11/10 gentamicin >>  11/10 Rocephin >>   Dose adjustments this admission: Gentamicin level 3.6 (11.5h after initial dose)-->decrease gent to [phone]  Microbiology results: None available in Epic  Thank you for allowing pharmacy to be a part of this patient's care.  Netta Cedars, PharmD, BCPS 02/04/2019, 12:18 AM

## 2019-02-05 ENCOUNTER — Encounter (HOSPITAL_COMMUNITY): Payer: Self-pay | Admitting: Urology

## 2019-02-05 DIAGNOSIS — Z5321 Procedure and treatment not carried out due to patient leaving prior to being seen by health care provider: Secondary | ICD-10-CM | POA: Diagnosis not present

## 2019-02-05 DIAGNOSIS — R0789 Other chest pain: Secondary | ICD-10-CM | POA: Diagnosis not present

## 2019-02-05 DIAGNOSIS — R079 Chest pain, unspecified: Secondary | ICD-10-CM | POA: Diagnosis not present

## 2019-02-05 NOTE — Discharge Summary (Signed)
Physician Discharge Summary  Patient ID: Jonathan [name] MRN: [id number] DOB/AGE: 1941/01/29 78 y.o.  Admit date: 02/03/2019 Discharge date: 02/05/2019  Admission Diagnoses:  Urinary retention  Discharge Diagnoses:  Principal Problem:   Urinary retention Active Problems:   Malignant neoplasm of prostate [facility])   Past Medical History:  Diagnosis Date  . Acquired bladder diverticulum    12/ 2012  resection diverticulum done at [facility] in [place], [place]  . Age-related cataract of right eye    scheduled for catarat extraction 09-12-2017  . Agent orange exposure   . Aneurysm of infrarenal abdominal aorta ([place])    first dx 2017--- last CT 06-11-2017  measures 3.5cm  . Chronic constipation   . COPD with emphysema ([place])    06--12-2017  per pt no symptoms since Feb 2019  . Depression    PTSD  . Diabetes mellitus type 2, diet-controlled ([place])   . Diabetic neuropathy ([place])   . Dyslipidemia   . Dyspnea on exertion   . Erectile dysfunction   . Feeling of incomplete bladder emptying   . GERD (gastroesophageal reflux disease)   . Gout    09-02-2017 last flare up 3 months ago  . Hiatal hernia   . History of atrial fibrillation    episode post op right lung lobectomy 07-04-2015  . History of bladder stone   . History of colon polyps   . History of DVT of lower extremity    03/ 2019  bilateral lower extremity (superficial) ---  per treated w/ oral medication   . History of gastritis   . History of kidney stones   . History of radiation therapy 09-14-2015  to 09-21-2015   left upper lung nodule -- 54Gy in 3 fractions (18 Gy per fraction)  . Hyperplasia of prostate with lower urinary tract symptoms (LUTS)   . Hypertension   . Lumbar spinal stenosis   . Nephrolithiasis   . Neurogenic bladder   . Osteoarthritis    ankle, hands  . Osteoporosis   . Prostate cancer [facility]) urologist-  dr Teodora Baumgarten/  oncologist-  dr Tammi Klippel   dx 05-24-2017-- Stage T2b,  Gleason 4+4,  PSA 22.41,  vol 38cc---  started ADT 04/ 2019,  plan external radiation therapy  . Radiation fibrosis of lung ([place])    hx left upper long nodule SBRT 06/ 2017  . Squamous cell carcinoma of both lungs ([place]) last CT in epic dated 06-26-2017 no recurrence   dx 03/ 2017  non-small cell SCC via bronchoscopy w/ bx's by dr Roxan Hockey---  s/p  right VATS w/ right lobectomy and node dissection's 07-04-2015 /  pt had SBRT to left upper nodule Stage 1 ([facility]) completed 09-21-2015  . Wears dentures    bottom  . Wears glasses     Surgeries: Procedure(s): TRANSURETHRAL RESECTION OF THE PROSTATE (TURP) on 02/03/2019   Consultants (if any):   Discharged Condition: Improved  Hospital Course: Jonathan [name] is an 78 y.o. male who was admitted 02/03/2019 with a diagnosis of Urinary retention and went to the operating room on 02/03/2019 and underwent the above named procedures.  He did well post op.  The SP tube was plugged and the foley was removed on POD 1.  He voided some and was d/c'd home with instructions to check PVR's by unplugging the SP tube on a regular basis.   He was given perioperative antibiotics:  Anti-infectives (From admission, onward)   Start     Dose/Rate Route Frequency Ordered Stop  02/04/19 1800  gentamicin (GARAMYCIN) 520 mg in dextrose 5 % 100 mL IVPB  Status:  Discontinued     520 mg 113 mL/hr over 60 Minutes Intravenous Every 36 hours 02/04/19 0027 02/04/19 1317   02/04/19 0800  cefTRIAXone (ROCEPHIN) 1 g in sodium chloride 0.9 % 100 mL IVPB  Status:  Discontinued     1 g 200 mL/hr over 30 Minutes Intravenous Every 24 hours 02/03/19 1112 02/03/19 1645   02/04/19 0800  gentamicin (GARAMYCIN) 520 mg in dextrose 5 % 100 mL IVPB  Status:  Discontinued     520 mg 113 mL/hr over 60 Minutes Intravenous Every 24 hours 02/03/19 1627 02/04/19 0027   02/04/19 0800  cefTRIAXone (ROCEPHIN) 2 g in sodium chloride 0.9 % 100 mL IVPB  Status:  Discontinued     2 g 200 mL/hr over 30 Minutes  Intravenous Every 24 hours 02/03/19 1645 02/04/19 1317   02/03/19 0600  gentamicin (GARAMYCIN) 560 mg in dextrose 5 % 100 mL IVPB     5 mg/kg  112.9 kg 114 mL/hr over 60 Minutes Intravenous 30 min pre-op 02/02/19 0824 02/04/19 0801   02/03/19 0534  cefTRIAXone (ROCEPHIN) 2 g in sodium chloride 0.9 % 100 mL IVPB     2 g 200 mL/hr over 30 Minutes Intravenous 30 min pre-op 02/03/19 0534 02/03/19 1303    .  He was given sequential compression devices for DVT prophylaxis.  He benefited maximally from the hospital stay and there were no complications.    Recent vital signs:  Vitals:   02/03/19 2216 02/04/19 0448  BP: 125/80 130/84  Pulse: 74 71  Resp: 20 20  Temp: 97.7 F (36.5 C) 98.3 F (36.8 C)  SpO2: 96% 99%    Recent laboratory studies:  Lab Results  Component Value Date   HGB 11.9 (L) 01/30/2019   HGB 11.7 (L) 08/02/2018   HGB 11.2 (L) 07/26/2018   Lab Results  Component Value Date   WBC 5.0 01/30/2019   PLT 181 01/30/2019   Lab Results  Component Value Date   INR 0.99 02/24/2018   Lab Results  Component Value Date   NA 140 01/30/2019   K 3.6 01/30/2019   CL 105 01/30/2019   CO2 26 01/30/2019   BUN 17 01/30/2019   CREATININE 1.28 (H) 01/30/2019   GLUCOSE 97 01/30/2019    Discharge Medications:   Allergies as of 02/04/2019      Reactions   Ciprofloxacin Other (See Comments)   Doesn't work   Levaquin [levofloxacin] Other (See Comments)   Doesn't work      Medication List    STOP taking these medications   atenolol 50 MG tablet Commonly known as: TENORMIN   Myrbetriq 50 MG Tb24 tablet Generic drug: mirabegron ER     TAKE these medications   atenolol-chlorthalidone 50-25 MG tablet Commonly known as: TENORETIC Take 1 tablet by mouth daily.   belladonna-opium 16.2-30 MG suppository Commonly known as: B&O SUPPRETTES Place 1 suppository (30 mg total) rectally 2 (two) times daily as needed for pain (rectal spasms).   chlorhexidine 0.12 %  solution Commonly known as: PERIDEX 15 mLs by Mouth Rinse route 2 (two) times daily.   gabapentin 400 MG capsule Commonly known as: NEURONTIN Take 400 mg by mouth at bedtime.   ketorolac 0.4 % Soln Commonly known as: ACULAR Place 1 drop into the left eye 2 (two) times daily.   Lantus SoloStar 100 UNIT/ML Solostar Pen Generic drug: Insulin Glargine Inject  2.5 Units into the skin daily as needed (blood sugar > 130).   Linzess 72 MCG capsule Generic drug: linaclotide Take 72 mcg by mouth daily as needed (constipation).   nicotine polacrilex 4 MG gum Commonly known as: NICORETTE Take 4 mg by mouth as needed for smoking cessation.   Oxycodone HCl 20 MG Tabs Take 10 mg by mouth 3 (three) times daily.   POTASSIUM PO Take by mouth.   predniSONE 20 MG tablet Commonly known as: DELTASONE Take 20-40 mg by mouth daily as needed (for gout symptoms).   sildenafil 100 MG tablet Commonly known as: VIAGRA Take 100 mg by mouth daily as needed for erectile dysfunction.   Simbrinza 1-0.2 % Susp Generic drug: Brinzolamide-Brimonidine Place 1 drop into both eyes daily.   timolol 0.5 % ophthalmic solution Commonly known as: TIMOPTIC Place 1 drop into both eyes daily.       Diagnostic Studies: No results found.  Disposition:   Discharge Instructions    Discontinue IV   Complete by: As directed       Follow-up Information    [facility].   Why: as scheduled Contact information: [address], [address] [place] [phone]           Signed: Irine Seal 02/05/2019, 3:40 PM

## 2019-02-06 ENCOUNTER — Other Ambulatory Visit: Payer: Self-pay

## 2019-02-06 ENCOUNTER — Ambulatory Visit (INDEPENDENT_AMBULATORY_CARE_PROVIDER_SITE_OTHER): Payer: Medicare Other | Admitting: Urology

## 2019-02-06 DIAGNOSIS — R339 Retention of urine, unspecified: Secondary | ICD-10-CM

## 2019-02-21 ENCOUNTER — Emergency Department (HOSPITAL_COMMUNITY): Payer: Medicare Other

## 2019-02-21 ENCOUNTER — Emergency Department (HOSPITAL_COMMUNITY)
Admission: EM | Admit: 2019-02-21 | Discharge: 2019-02-21 | Disposition: A | Discharge status: Home / Self Care | Payer: Medicare Other | Attending: Emergency Medicine | Admitting: Emergency Medicine

## 2019-02-21 ENCOUNTER — Other Ambulatory Visit: Payer: Self-pay

## 2019-02-21 ENCOUNTER — Encounter (HOSPITAL_COMMUNITY): Payer: Self-pay

## 2019-02-21 DIAGNOSIS — N3090 Cystitis, unspecified without hematuria: Secondary | ICD-10-CM | POA: Insufficient documentation

## 2019-02-21 DIAGNOSIS — J449 Chronic obstructive pulmonary disease, unspecified: Secondary | ICD-10-CM | POA: Diagnosis not present

## 2019-02-21 DIAGNOSIS — Z79899 Other long term (current) drug therapy: Secondary | ICD-10-CM | POA: Diagnosis not present

## 2019-02-21 DIAGNOSIS — I10 Essential (primary) hypertension: Secondary | ICD-10-CM | POA: Diagnosis not present

## 2019-02-21 DIAGNOSIS — Z794 Long term (current) use of insulin: Secondary | ICD-10-CM | POA: Diagnosis not present

## 2019-02-21 DIAGNOSIS — E119 Type 2 diabetes mellitus without complications: Secondary | ICD-10-CM | POA: Insufficient documentation

## 2019-02-21 DIAGNOSIS — R339 Retention of urine, unspecified: Secondary | ICD-10-CM | POA: Insufficient documentation

## 2019-02-21 DIAGNOSIS — Z87891 Personal history of nicotine dependence: Secondary | ICD-10-CM | POA: Diagnosis not present

## 2019-02-21 DIAGNOSIS — N200 Calculus of kidney: Secondary | ICD-10-CM | POA: Diagnosis not present

## 2019-02-21 LAB — URINALYSIS, ROUTINE W REFLEX MICROSCOPIC
Bacteria, UA: NONE SEEN
Bilirubin Urine: NEGATIVE
Glucose, UA: NEGATIVE mg/dL
Ketones, ur: NEGATIVE mg/dL
Nitrite: NEGATIVE
Protein, ur: 30 mg/dL — AB
RBC / HPF: >50 RBC/hpf — ABNORMAL HIGH (ref 0–5)
Specific Gravity, Urine: 1.016 (ref 1.005–1.030)
pH: 6 (ref 5.0–8.0)

## 2019-02-21 MED ORDER — CEPHALEXIN 500 MG PO CAPS: 1000.0000 mg | ORAL_CAPSULE | Freq: Two times a day (BID) | ORAL | 0 refills | Status: DC

## 2019-02-21 NOTE — ED Provider Notes (Signed)
[facility] DEPT Provider Note   CSN: [id number] Arrival date & time: 02/21/19  0941     History   Chief Complaint Chief Complaint  Patient presents with  . Urinary Retention  . Back Pain  . Hypertension    HPI GAR GLANCE is a 78 y.o. male.     HPI Patient presents to the emergency department with inability to urinate that started around 5 AM this morning.  The patient states he was able to push out a small amount of urine but no normal amounts.  The patient states that he had this issue following his prostatectomy.  The patient states he was back to urinating normally.  The patient states he sees Dr. Jeffie Pollock as his urologist.  The patient states that he does not have any fevers.  The patient states that he is having some discomfort around the flanks and abdomen in the lower part this morning.  The patient denies chest pain, shortness of breath, headache,blurred vision, neck pain, fever, cough, weakness, numbness, dizziness, anorexia, edema, , nausea, vomiting, diarrhea, rash, back pain,  hematemesis, bloody stool, near syncope, or syncope. Past Medical History:  Diagnosis Date  . Acquired bladder diverticulum    12/ 2012  resection diverticulum done at [name] in [place], [place]  . Age-related cataract of right eye    scheduled for catarat extraction 09-12-2017  . Agent orange exposure   . Aneurysm of infrarenal abdominal aorta ([place])    first dx 2017--- last CT 06-11-2017  measures 3.5cm  . Chronic constipation   . COPD with emphysema ([place])    06--12-2017  per pt no symptoms since Feb 2019  . Depression    PTSD  . Diabetes mellitus type 2, diet-controlled ([place])   . Diabetic neuropathy ([place])   . Dyslipidemia   . Dyspnea on exertion   . Erectile dysfunction   . Feeling of incomplete bladder emptying   . GERD (gastroesophageal reflux disease)   . Gout    09-02-2017 last flare up 3 months ago  . Hiatal hernia   . History of atrial  fibrillation    episode post op right lung lobectomy 07-04-2015  . History of bladder stone   . History of colon polyps   . History of DVT of lower extremity    03/ 2019  bilateral lower extremity (superficial) ---  per treated w/ oral medication   . History of gastritis   . History of kidney stones   . History of radiation therapy 09-14-2015  to 09-21-2015   left upper lung nodule -- 54Gy in 3 fractions (18 Gy per fraction)  . Hyperplasia of prostate with lower urinary tract symptoms (LUTS)   . Hypertension   . Lumbar spinal stenosis   . Nephrolithiasis   . Neurogenic bladder   . Osteoarthritis    ankle, hands  . Osteoporosis   . Prostate cancer [facility]) urologist-  dr wrenn/  oncologist-  dr Tammi Klippel   dx 05-24-2017-- Stage T2b,  Gleason 4+4,  PSA 22.41,  vol 38cc--- started ADT 04/ 2019,  plan external radiation therapy  . Radiation fibrosis of lung ([place])    hx left upper long nodule SBRT 06/ 2017  . Squamous cell carcinoma of both lungs ([place]) last CT in epic dated 06-26-2017 no recurrence   dx 03/ 2017  non-small cell SCC via bronchoscopy w/ bx's by dr Roxan Hockey---  s/p  right VATS w/ right lobectomy and node dissection's 07-04-2015 /  pt had SBRT  to left upper nodule Stage 1 ([facility]) completed 09-21-2015  . Wears dentures    bottom  . Wears glasses     Patient Active Problem List   Diagnosis Date Noted  . Urinary retention 04/18/2018  . Malignant neoplasm of prostate ([place]) 07/10/2017  . Arthritis of right subtalar joint 04/18/2017  . 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 Idiopathic chronic venous hypertension of both lower extremities with inflammation 05/22/2016  . Fibrosis of subtalar joint, right 03/08/2016  . Primary malignant neoplasm of left upper lobe of lung ([place]) 09/21/2015  . Lung cancer ([place]) 07/04/2015  . Diabetes mellitus type II, controlled ([place]) 06/01/2015  . Neoplasm of uncertain behavior of right upper lobe of lung  05/31/2015  . 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 Emphysema of lung ([place])   . Kidney stones   . Osteoporosis   . H/O total ankle replacement, right 04/20/2015  . Carpal tunnel syndrome - right 07/09/2014  . Abdominal bloating 06/14/2014  . History of colonic polyps 06/14/2014  . Diabetic polyneuropathy associated with type 2 diabetes mellitus ([place]) 12/05/2012  . Lesion of ulnar nerve 12/05/2012  . Syncope 11/15/2012  . Gout 11/15/2012  . Constipation 04/02/2009  . CYSTITIS, RECURRENT 12/02/2008  . COPD 10/26/2008  . DIVERTICULUM, BLADDER 10/11/2008  . INSOMNIA 07/24/2008  . ACUTE CYSTITIS 07/20/2008  . FATIGUE 07/20/2008  . DEPRESSION 02/27/2008  . CHEST PAIN UNSPECIFIED 02/27/2008  . NICOTINE ADDICTION 08/12/2007  . DYSLIPIDEMIA 07/22/2007  . OBESITY 07/22/2007  . ERECTILE DYSFUNCTION 07/22/2007  . HYPERTENSION 07/22/2007    Past Surgical History:  Procedure Laterality Date  . CARPAL TUNNEL RELEASE Right 07/09/2014   Procedure: RIGHT OPEN CARPAL TUNNEL RELEASE;  Surgeon: Mcarthur Rossetti, MD;  Location: [facility];  Service: Orthopedics;  Laterality: Right;  . CHOLECYSTECTOMY N/A 02/24/2014   Procedure: LAPAROSCOPIC CHOLECYSTECTOMY;  Surgeon: Coralie Keens, MD;  Location: [facility];  Service: General;  Laterality: N/A;  . COLONOSCOPY    . CYSTOLITHOT[facility]Y  11[facility] [place]  . CYSTOSCOPY N/A 04/18/2018   Procedure: CYSTOSCOPY FLEXIBLE;  Surgeon: Irine Seal, MD;  Location: [facility];  Service: Urology;  Laterality: N/A;  . CYSTOSCOPY W/ LITHOLAPAXY / EHL  02-24-2007   dr Janice Norrie  [facility]  . dental implant     No teeth at this time waiting for them to be made as of 01-30-19  . GOLD SEED IMPLANT N/A 09/05/2017   Procedure: GOLD SEED IMPLANT;  Surgeon: Irine Seal, MD;  Location: [facility];  Service: Urology;  Laterality: N/A;  . INSERTION OF SUPRAPUBIC CATHETER N/A 04/18/2018   Procedure: SUPRAPUBIC TUBE  CHANGE;  Surgeon: Irine Seal, MD;  Location: [facility];  Service: Urology;  Laterality: N/A;  . IR CATHETER TUBE CHANGE  02/24/2018  . SPACE OAR INSTILLATION N/A 09/05/2017   Procedure: SPACE OAR INSTILLATION;  Surgeon: Irine Seal, MD;  Location: [facility];  Service: Urology;  Laterality: N/A;  . TOTAL ANKLE ARTHROPLASTY Right 04/20/2015   Procedure: TOTAL ANKLE ARTHOPLASTY;  Surgeon: Newt Minion, MD;  Location: [facility];  Service: Orthopedics;  Laterality: Right;  . TRANSTHORACIC ECHOCARDIOGRAM  11/16/2012   ef 55-60%,  grade 1 diastolic dysfunction/  mild dilated ascending aorta/  mild MR/ trivial TR  . TRANSURETHRAL RESECTION OF PROSTATE N/A 02/03/2019   Procedure: TRANSURETHRAL RESECTION OF THE PROSTATE (TURP);  Surgeon: Irine Seal,  MD;  [facility];  Service: Urology;  Laterality: N/A;  . VIDEO ASSISTED THORACOSCOPY (VATS)/ LOBECTOMY Right 07/04/2015   Procedure: VIDEO ASSISTED THORACOSCOPY (VATS)/ RIGHT UPPER LOBECTOMY;  Surgeon: Melrose Nakayama, MD;  Location: [facility];  Service: Thoracic;  Laterality: Right;  [name] VIDEO BRONCHOSCOPY N/A 06/17/2015   Procedure: VIDEO BRONCHOSCOPY;  Surgeon: Melrose Nakayama, MD;  Location: [facility];  Service: Thoracic;  Laterality: N/A;  . VIDEO BRONCHOSCOPY WITH ENDOBRONCHIAL NAVIGATION N/A 06/17/2015   Procedure: VIDEO BRONCHOSCOPY WITH ENDOBRONCHIAL NAVIGATION;  Surgeon: Melrose Nakayama, MD;  Location: [facility];  Service: Thoracic;  Laterality: N/A;  . VIDEO BRONCHOSCOPY WITH ENDOBRONCHIAL ULTRASOUND N/A 06/17/2015   Procedure: VIDEO BRONCHOSCOPY WITH ENDOBRONCHIAL ULTRASOUND;  Surgeon: Melrose Nakayama, MD;  Location: [facility];  Service: Thoracic;  Laterality: N/A;        Home Medications    Prior to Admission medications   Medication Sig Start Date End Date Taking? Authorizing Provider  alum & mag hydroxide-simeth (MAALOX/MYLANTA) 200-200-20 MG/5ML suspension Take 15 mLs by mouth every 6 (six) hours as needed for indigestion or  heartburn.   Yes [provider]  atenolol-chlorthalidone (TENORETIC) 50-25 MG tablet Take 1 tablet by mouth daily. 12/03/18  Yes [provider]  belladonna-opium (B&O SUPPRETTES) 16.2-30 MG suppository Place 1 suppository (30 mg total) rectally 2 (two) times daily as needed for pain (rectal spasms). 12/21/17  Yes Duffy Bruce, MD  chlorhexidine (PERIDEX) 0.12 % solution 15 mLs by Mouth Rinse route 2 (two) times daily as needed (throat irritation).  12/30/18  Yes [provider]  diclofenac Sodium (VOLTAREN) 1 % GEL Apply 2 g topically 4 (four) times daily as needed (pain).   Yes [provider]  gabapentin (NEURONTIN) 400 MG capsule Take 400 mg by mouth at bedtime.    Yes [provider]  ketorolac (ACULAR) 0.4 % SOLN Place 1 drop into the left eye 2 (two) times daily.  11/28/17  Yes [provider]  LANTUS SOLOSTAR 100 UNIT/ML Solostar Pen Inject 5 Units into the skin daily as needed (blood sugar > 130).  11/28/17  Yes [provider]  LINZESS 72 MCG capsule Take 72 mcg by mouth daily as needed (constipation).  10/03/17  Yes [provider]  metFORMIN (GLUCOPHAGE) 500 MG tablet Take 250 mg by mouth daily as needed (feet swelling).   Yes [provider]  Oxycodone HCl 20 MG TABS Take 10 mg by mouth 3 (three) times daily. 02/02/18  Yes [provider]  polyethylene glycol (MIRALAX / GLYCOLAX) 17 g packet Take 17 g by mouth daily.   Yes [provider]  predniSONE (DELTASONE) 20 MG tablet Take 20 mg by mouth daily as needed (for gout symptoms).    Yes [provider]  sildenafil (VIAGRA) 100 MG tablet Take 100 mg by mouth daily as needed for erectile dysfunction.    Yes [provider]  SIMBRINZA 1-0.2 % SUSP Place 1 drop into both eyes daily.  09/24/17  Yes [provider]    Family History Family History  Problem Relation Age of Onset  . Cancer Father        Asbestos  . Colon  cancer Neg Hx   . Colon polyps Neg Hx   . Kidney disease Neg Hx   . Esophageal cancer Neg Hx   . Gallbladder disease Neg Hx   . Heart disease Neg Hx   . Diabetes Neg Hx     Social History Social History  Tobacco Use  . Smoking status: Former Smoker    Packs/day: 0.50    Years: 40.00    Pack years: 20.00    Types: Cigarettes    Quit date: 02/13/2015    Years since quitting: 4.0  . Smokeless tobacco: Never Used  Substance Use Topics  . Alcohol use: No    Alcohol/week: 0.0 standard drinks  . Drug use: No     Allergies   Ciprofloxacin and Levaquin [levofloxacin]   Review of Systems Review of Systems  All other systems negative except as documented in the HPI. All pertinent positives and negatives as reviewed in the HPI. Physical Exam Updated Vital Signs BP 137/86 (BP Location: Right Arm)   Pulse 67   Temp 98.9 F (37.2 C) (Oral)   Resp 18   Ht 6\' 8"  (2.032 m)   Wt 112.9 kg   SpO2 100%   BMI 27.35 kg/m   Physical Exam Vitals signs and nursing note reviewed.  Constitutional:      General: He is not in acute distress.    Appearance: He is well-developed.  HENT:     Head: Normocephalic and atraumatic.  Eyes:     Pupils: Pupils are equal, round, and reactive to light.  Neck:     Musculoskeletal: Normal range of motion and neck supple.  Cardiovascular:     Rate and Rhythm: Normal rate and regular rhythm.     Heart sounds: Normal heart sounds. No murmur. No friction rub. No gallop.   Pulmonary:     Effort: Pulmonary effort is normal. No respiratory distress.     Breath sounds: Normal breath sounds. No wheezing.  Abdominal:     General: Bowel sounds are normal. There is no distension.     Palpations: Abdomen is soft.     Tenderness: There is abdominal tenderness.  Skin:    General: Skin is warm and dry.     Capillary Refill: Capillary refill takes less than 2 seconds.     Findings: No erythema or rash.  Neurological:     Mental Status: He is alert and  oriented to person, place, and time.     Motor: No abnormal muscle tone.     Coordination: Coordination normal.  Psychiatric:        Behavior: Behavior normal.      ED Treatments / Results  Labs (all labs ordered are listed, but only abnormal results are displayed) Labs Reviewed  URINALYSIS, ROUTINE W REFLEX MICROSCOPIC - Abnormal; Notable for the following components:      Result Value   Hgb urine dipstick MODERATE (*)    Protein, ur 30 (*)    Leukocytes,Ua TRACE (*)    RBC / HPF >50 (*)    All other components within normal limits    EKG None  Radiology Ct Renal Stone Study  Result Date: 02/21/2019 CLINICAL DATA:  Right-sided pain and dysuria EXAM: CT ABDOMEN AND PELVIS WITHOUT CONTRAST TECHNIQUE: Multidetector CT imaging of the abdomen and pelvis was performed following the standard protocol without oral or IV contrast. COMPARISON:  December 21, 2017 FINDINGS: Lower chest: There is no evident edema or consolidation in the lung bases. Hepatobiliary: No focal liver lesions are evident on this noncontrast enhanced study. Gallbladder is absent. There is no biliary duct dilatation. Pancreas: There is no pancreatic mass or inflammatory focus. Spleen: No splenic lesions are evident. Adrenals/Urinary Tract: Adrenals bilaterally appear unremarkable. There is a cyst in the medial upper right kidney measuring 2.5 x 2.0  cm. Smaller cysts are noted elsewhere in the right kidney. There is a cyst in the lateral mid to lower left kidney measuring 2.5 x 2.3 cm. There is a cyst in the lower pole of the left kidney measuring 2.2 x 2.2 cm. There is no evident hydronephrosis on either side. There is a 2 mm calculus in the lower pole left kidney. There is no appreciable ureteral calculus on either side. There is a Foley catheter within the urinary bladder. A small amount of fluid remains in the bladder. The bladder wall is thickened. Air within the urinary bladder may well be of iatrogenic etiology given  Foley catheter placement. Stomach/Bowel: There is no appreciable bowel wall or mesenteric thickening. Terminal ileum appears unremarkable. No bowel obstruction. No free air or portal venous air. Vascular/Lymphatic: There is dilatation of the distal abdominal aorta with a maximum transverse diameter of 3.8 x 3.5 cm. There is aortic and iliac artery atherosclerosis. No adenopathy is evident in the abdomen or pelvis. Reproductive: Prostate and seminal vesicles are normal in size and contour. No pelvic mass evident. Other: Appendix appears normal. No evident abscess or ascites in the abdomen or pelvis. Musculoskeletal: There is degenerative change in the lumbar spine. There is vacuum phenomenon at L4-5. No blastic or lytic bone lesions are evident. There is no intramuscular or abdominal wall lesion. IMPRESSION: 1. Urinary bladder wall thickening consistent with cystitis. Foley catheter present. Air within the urinary bladder may well be of iatrogenic etiology given the Foley placement. 2. Nonobstructing 2 mm calculus lower pole left kidney. No hydronephrosis or ureteral calculus on either side. 3. No bowel obstruction. No abscess in the abdomen or pelvis. Appendix appears normal. 4. Dilatation of the distal abdominal aorta with a maximum transverse diameter of 3.8 x 3.5 cm. Recommend followup by ultrasound in 2 years. This recommendation follows ACR consensus guidelines: White Paper of the ACR Incidental Findings Committee II on Vascular Findings. J Am Coll Radiol 2013; 10:789-794. Aortic aneurysm NOS (ICD10-I71.9). 5.  Gallbladder absent. Electronically Signed   By: Lowella Grip III M.D.   On: 02/21/2019 12:08    Procedures Procedures (including critical care time)  Medications Ordered in ED Medications - No data to display   Initial Impression / Assessment and Plan / ED Course  I have reviewed the triage vital signs and the nursing notes.  Pertinent labs & imaging results that were available during  my care of the patient were reviewed by me and considered in my medical decision making (see chart for details).        Patient be treated for urinary tract infection along with urinary retention.  Foley catheter was placed and this will remain in place until he can follow-up with Dr. Jeffie Pollock.  Patient is advised the plan and all questions were answered.  Final Clinical Impressions(s) / ED Diagnoses   Final diagnoses:  None    ED Discharge Orders    None       Dalia Heading, PA-C 02/21/19 1401    Lacretia Leigh, MD 02/21/19 1445

## 2019-02-21 NOTE — ED Triage Notes (Addendum)
Patient states that he last urinated normally was last night. patient states he woke at 0500 and urinated a small amount. Patient also c/o right lower back pain.  Patient hypertensive in triage-182/116. patient states he did not take his BP med today.

## 2019-02-21 NOTE — ED Provider Notes (Signed)
Medical screening examination/treatment/procedure(s) were conducted as a shared visit with non-physician practitioner(s) and myself.  I personally evaluated the patient during the encounter.    78 year old male presents with urinary retention.  Catheter placed by nursing with good output.  Patient with evidence of UTI.  Will place on antibiotics outpatient follow-up with urology   Lacretia Leigh, MD 02/21/19 1146

## 2019-02-21 NOTE — ED Notes (Signed)
Family at bedside. 

## 2019-02-21 NOTE — Discharge Instructions (Addendum)
Return here as needed.  Follow-up with your urologist as soon as possible.  You do have what looks to be a urinary tract infection we will treat for this.

## 2019-02-27 ENCOUNTER — Other Ambulatory Visit: Payer: Self-pay

## 2019-02-27 ENCOUNTER — Ambulatory Visit (INDEPENDENT_AMBULATORY_CARE_PROVIDER_SITE_OTHER): Payer: Medicare Other | Admitting: Urology

## 2019-02-27 DIAGNOSIS — R338 Other retention of urine: Secondary | ICD-10-CM

## 2019-03-16 ENCOUNTER — Other Ambulatory Visit: Payer: Self-pay

## 2019-03-16 DIAGNOSIS — C61 Malignant neoplasm of prostate: Secondary | ICD-10-CM

## 2019-03-17 ENCOUNTER — Telehealth: Payer: Self-pay

## 2019-03-17 ENCOUNTER — Other Ambulatory Visit: Payer: Self-pay

## 2019-03-17 NOTE — Telephone Encounter (Signed)
Pt. Called stating he is urinating every hour. Pt. Denies burning or pain. Pt. Asks if this will continue. Please advise.

## 2019-03-17 NOTE — Telephone Encounter (Signed)
-----   Message from Irine Seal, MD sent at 03/17/2019 11:20 AM EST ----- I think in his situation that the frequency is not unexpected and I can't really say when it is likely to improve.

## 2019-03-17 NOTE — Telephone Encounter (Signed)
Pt. Notified of Dr. Denton Lank instructions and pt. Voiced understanding.

## 2019-04-02 ENCOUNTER — Encounter (INDEPENDENT_AMBULATORY_CARE_PROVIDER_SITE_OTHER): Payer: Self-pay

## 2019-04-02 ENCOUNTER — Telehealth: Payer: Self-pay | Admitting: Urology

## 2019-04-02 NOTE — Telephone Encounter (Signed)
Pt called and states he is having a issue with frequency. Requests a nurse call back.

## 2019-04-06 ENCOUNTER — Ambulatory Visit (INDEPENDENT_AMBULATORY_CARE_PROVIDER_SITE_OTHER): Payer: Medicare Other | Admitting: Gastroenterology

## 2019-04-06 ENCOUNTER — Other Ambulatory Visit: Payer: Self-pay | Admitting: Urology

## 2019-04-06 DIAGNOSIS — C61 Malignant neoplasm of prostate: Secondary | ICD-10-CM | POA: Diagnosis not present

## 2019-04-06 NOTE — Telephone Encounter (Signed)
Called x1. No answer.

## 2019-04-07 ENCOUNTER — Other Ambulatory Visit: Payer: Self-pay | Admitting: Urology

## 2019-04-07 LAB — PSA: PSA: <0.1 ng/mL (ref <=4.0)

## 2019-04-07 LAB — TESTOSTERONE: Testosterone: 14 ng/dL — ABNORMAL LOW (ref 250–827)

## 2019-04-10 ENCOUNTER — Ambulatory Visit (INDEPENDENT_AMBULATORY_CARE_PROVIDER_SITE_OTHER): Payer: Medicare Other | Admitting: Urology

## 2019-04-10 ENCOUNTER — Other Ambulatory Visit (HOSPITAL_COMMUNITY)
Admission: AD | Admit: 2019-04-10 | Discharge: 2019-04-10 | Disposition: A | Discharge status: Home / Self Care | Payer: Medicare Other | Source: Other Acute Inpatient Hospital | Attending: Urology | Admitting: Urology

## 2019-04-10 VITALS — BP 121/70 | HR 85 | Temp 97.3°F | Ht >= 80 in | Wt 245.0 lb

## 2019-04-10 DIAGNOSIS — Z8546 Personal history of malignant neoplasm of prostate: Secondary | ICD-10-CM | POA: Diagnosis not present

## 2019-04-10 DIAGNOSIS — N3000 Acute cystitis without hematuria: Secondary | ICD-10-CM | POA: Diagnosis not present

## 2019-04-10 DIAGNOSIS — N3941 Urge incontinence: Secondary | ICD-10-CM

## 2019-04-10 DIAGNOSIS — R339 Retention of urine, unspecified: Secondary | ICD-10-CM

## 2019-04-10 LAB — POCT URINALYSIS DIPSTICK
Bilirubin, UA: NEGATIVE
Glucose, UA: POSITIVE — AB
Ketones, UA: NEGATIVE
Nitrite, UA: NEGATIVE
Protein, UA: POSITIVE — AB
Spec Grav, UA: 1.015 (ref 1.010–1.025)
Urobilinogen, UA: NEGATIVE E.U./dL — AB
pH, UA: 5 (ref 5.0–8.0)

## 2019-04-10 MED ORDER — LEUPROLIDE ACETATE (6 MONTH) 45 MG IM KIT: 45.0000 mg | PACK | Freq: Once | INTRAMUSCULAR | Status: DC

## 2019-04-10 MED ORDER — FITTED BRIEFS MAXIMUM XL MISC: 11 refills | Status: DC

## 2019-04-10 NOTE — Progress Notes (Signed)
Subjective:  1. Urinary retention   2. History of prostate cancer   3. Acute cystitis without hematuria   4. Urge incontinence     Jonathan [name] returns today in f/u. He has a history of urinary retention following radiation therapy and required prolonged use of an SP tube followed by a TURP on 02/03/19.  He is doing better with no further pain or spasms.  He has persistent frequency and urgency with UUI and he has to wear two depends.  He has a good stream and he feels he empties well.  He has some odor to his urine.  He has mild dysuria.  He has no hematuria.   His IPSS is 22/6.    He has a history of T3b N0 M0 Gleason 9 prostate cancer that was treated with EXRT and ADT.  He is overdue for a Lupron injection which was last given in 5/19 but doesn't want to do it today.   is PSA prior to this visit is <0.1 and his testosterone is 14.       IPSS    Row Name 04/10/19 0900         International Prostate Symptom Score   How often have you had the sensation of not emptying your bladder?  More than half the time     How often have you had to urinate less than every two hours?  Almost always     How often have you found you stopped and started again several times when you urinated?  More than half the time     How often have you found it difficult to postpone urination?  Almost always     How often have you had a weak urinary stream?  Less than 1 in 5 times     How often have you had to strain to start urination?  Not at All     How many times did you typically get up at night to urinate?  3 Times     Total IPSS Score  22       Quality of Life due to urinary symptoms   If you were to spend the rest of your life with your urinary condition just the way it is now how would you feel about that?  Terrible         ROS:  Review of Systems  All other systems reviewed and are negative.   Allergies  Allergen Reactions  . Ciprofloxacin Other (See Comments)    Doesn't work  . Levaquin  [Levofloxacin] Other (See Comments)    Doesn't work    Outpatient Encounter Medications as of 04/10/2019  Medication Sig  . alum & mag hydroxide-simeth (MAALOX/MYLANTA) 200-200-20 MG/5ML suspension Take 15 mLs by mouth every 6 (six) hours as needed for indigestion or heartburn.  [name] atenolol-chlorthalidone (TENORETIC) 50-25 MG tablet Take 1 tablet by mouth daily.  . belladonna-opium (B&O SUPPRETTES) 16.2-30 MG suppository Place 1 suppository (30 mg total) rectally 2 (two) times daily as needed for pain (rectal spasms).  . chlorhexidine (PERIDEX) 0.12 % solution 15 mLs by Mouth Rinse route 2 (two) times daily as needed (throat irritation).   [name] diclofenac Sodium (VOLTAREN) 1 % GEL Apply 2 g topically 4 (four) times daily as needed (pain).  [name] gabapentin (NEURONTIN) 400 MG capsule Take 400 mg by mouth at bedtime.   [name] ketorolac (ACULAR) 0.4 % SOLN Place 1 drop into the left eye 2 (two) times daily.   [name] LANTUS SOLOSTAR 100  UNIT/ML Solostar Pen Inject 5 Units into the skin daily as needed (blood sugar > 130).   [name] LINZESS 72 MCG capsule Take 72 mcg by mouth daily as needed (constipation).   . metFORMIN (GLUCOPHAGE) 500 MG tablet Take 250 mg by mouth daily as needed (feet swelling).  . Oxycodone HCl 20 MG TABS Take 10 mg by mouth 3 (three) times daily.  . polyethylene glycol (MIRALAX / GLYCOLAX) 17 g packet Take 17 g by mouth daily.  . predniSONE (DELTASONE) 20 MG tablet Take 20 mg by mouth daily as needed (for gout symptoms).   . sildenafil (VIAGRA) 100 MG tablet Take 100 mg by mouth daily as needed for erectile dysfunction.   [name] SIMBRINZA 1-0.2 % SUSP Place 1 drop into both eyes daily.   . [DISCONTINUED] cephALEXin (KEFLEX) 500 MG capsule Take 2 capsules (1,000 mg total) by mouth 2 (two) times daily.   Facility-Administered Encounter Medications as of 04/10/2019  Medication  . [START ON 05/08/2019] Leuprolide Acetate (6 Month) (LUPRON) injection 45 mg    Past Medical History:  Diagnosis Date  .  Acquired bladder diverticulum    12/ 2012  resection diverticulum done at [facility] in [place] [employer], [place]  . Age-related cataract of right eye    scheduled for catarat extraction 09-12-2017  . Agent orange exposure   . Aneurysm of infrarenal abdominal aorta ([place])    first dx 2017--- last CT 06-11-2017  measures 3.5cm  . Chronic constipation   . COPD with emphysema ([place])    06--12-2017  per pt no symptoms since Feb 2019  . Depression    PTSD  . Diabetes mellitus type 2, diet-controlled ([place])   . Diabetic neuropathy ([place])   . Dyslipidemia   . Dyspnea on exertion   . Erectile dysfunction   . Feeling of incomplete bladder emptying   . GERD (gastroesophageal reflux disease)   . Gout    09-02-2017 last flare up 3 months ago  . Hiatal hernia   . History of atrial fibrillation    episode post op right lung lobectomy 07-04-2015  . History of bladder stone   . History of colon polyps   . History of DVT of lower extremity    03/ 2019  bilateral lower extremity (superficial) ---  per treated w/ oral medication   . History of gastritis   . History of kidney stones   . History of radiation therapy 09-14-2015  to 09-21-2015   left upper lung nodule -- 54Gy in 3 fractions (18 Gy per fraction)  . Hyperplasia of prostate with lower urinary tract symptoms (LUTS)   . Hypertension   . Lumbar spinal stenosis   . Nephrolithiasis   . Neurogenic bladder   . Osteoarthritis    ankle, hands  . Osteoporosis   . Prostate cancer [facility]) urologist-  dr Tahlor Berenguer/  oncologist-  dr Tammi Klippel   dx 05-24-2017-- Stage T2b,  Gleason 4+4,  PSA 22.41,  vol 38cc--- started ADT 04/ 2019,  plan external radiation therapy  . Radiation fibrosis of lung ([place])    hx left upper long nodule SBRT 06/ 2017  . Squamous cell carcinoma of both lungs ([place]) last CT in epic dated 06-26-2017 no recurrence   dx 03/ 2017  non-small cell SCC via bronchoscopy w/ bx's by dr Roxan Hockey---  s/p  right VATS w/ right lobectomy and node dissection's  07-04-2015 /  pt had SBRT to left upper nodule Stage 1 ([facility]) completed 09-21-2015  . Wears dentures  bottom  . Wears glasses     Past Surgical History:  Procedure Laterality Date  . CARPAL TUNNEL RELEASE Right 07/09/2014   Procedure: RIGHT OPEN CARPAL TUNNEL RELEASE;  Surgeon: Mcarthur Rossetti, MD;  Location: [facility];  Service: Orthopedics;  Laterality: Right;  . CHOLECYSTECTOMY N/A 02/24/2014   Procedure: LAPAROSCOPIC CHOLECYSTECTOMY;  Surgeon: Coralie Keens, MD;  Location: [place];  Service: General;  Laterality: N/A;  . COLONOSCOPY    . CYSTOLITHOT[facility]Y  [facility] [place]  . CYSTOSCOPY N/A 04/18/2018   Procedure: CYSTOSCOPY FLEXIBLE;  Surgeon: Irine Seal, MD;  Location: [facility];  Service: Urology;  Laterality: N/A;  . CYSTOSCOPY W/ LITHOLAPAXY / EHL  02-24-2007   dr Janice Norrie  [facility]  . dental implant     No teeth at this time waiting for them to be made as of 01-30-19  . GOLD SEED IMPLANT N/A 09/05/2017   Procedure: GOLD SEED IMPLANT;  Surgeon: Irine Seal, MD;  [facility];  Service: Urology;  Laterality: N/A;  . INSERTION OF SUPRAPUBIC CATHETER N/A 04/18/2018   Procedure: SUPRAPUBIC TUBE CHANGE;  Surgeon: Irine Seal, MD;  Location: [facility];  Service: Urology;  Laterality: N/A;  . IR CATHETER TUBE CHANGE  02/24/2018  . SPACE OAR INSTILLATION N/A 09/05/2017   Procedure: SPACE OAR INSTILLATION;  Surgeon: Irine Seal, MD;  Location: [facility];  Service: Urology;  Laterality: N/A;  . TOTAL ANKLE ARTHROPLASTY Right 04/20/2015   Procedure: TOTAL ANKLE ARTHOPLASTY;  Surgeon: Newt Minion, MD;  Location: [place];  Service: Orthopedics;  Laterality: Right;  . TRANSTHORACIC ECHOCARDIOGRAM  11/16/2012   ef 55-60%,  grade 1 diastolic dysfunction/  mild dilated ascending aorta/  mild MR/ trivial TR  . TRANSURETHRAL RESECTION OF PROSTATE N/A 02/03/2019   Procedure: TRANSURETHRAL RESECTION OF THE PROSTATE (TURP);   Surgeon: Irine Seal, MD;  [facility];  Service: Urology;  Laterality: N/A;  . VIDEO ASSISTED THORACOSCOPY (VATS)/ LOBECTOMY Right 07/04/2015   Procedure: VIDEO ASSISTED THORACOSCOPY (VATS)/ RIGHT UPPER LOBECTOMY;  Surgeon: Melrose Nakayama, MD;  Location: [place];  Service: Thoracic;  Laterality: Right;  [name] VIDEO BRONCHOSCOPY N/A 06/17/2015   Procedure: VIDEO BRONCHOSCOPY;  Surgeon: Melrose Nakayama, MD;  Location: [place];  Service: Thoracic;  Laterality: N/A;  . VIDEO BRONCHOSCOPY WITH ENDOBRONCHIAL NAVIGATION N/A 06/17/2015   Procedure: VIDEO BRONCHOSCOPY WITH ENDOBRONCHIAL NAVIGATION;  Surgeon: Melrose Nakayama, MD;  Location: [place];  Service: Thoracic;  Laterality: N/A;  . VIDEO BRONCHOSCOPY WITH ENDOBRONCHIAL ULTRASOUND N/A 06/17/2015   Procedure: VIDEO BRONCHOSCOPY WITH ENDOBRONCHIAL ULTRASOUND;  Surgeon: Melrose Nakayama, MD;  Location: [place];  Service: Thoracic;  Laterality: N/A;    Social History   Socioeconomic History  . Marital status: Widowed    Spouse name: Not on file  . Number of children: 2  . Years of education: college  . Highest education level: Not on file  Occupational History  . Occupation: retired  Tobacco Use  . Smoking status: Former Smoker    Packs/day: 0.50    Years: 40.00    Pack years: 20.00    Types: Cigarettes    Quit date: 02/13/2015    Years since quitting: 4.1  . Smokeless tobacco: Never Used  Substance and Sexual Activity  . Alcohol use: No    Alcohol/week: 0.0 standard drinks  . Drug use: No  . Sexual activity: Not Currently  Other Topics Concern  . Not  on file  Social History Narrative  . Not on file   Social Determinants of Health   Financial Resource Strain:   . Difficulty of Paying Living Expenses: Not on file  Food Insecurity:   . Worried About Charity fundraiser in the Last Year: Not on file  . Ran Out of Food in the Last Year: Not on file  Transportation Needs:   . Lack of Transportation (Medical): Not on file   . Lack of Transportation (Non-Medical): Not on file  Physical Activity:   . Days of Exercise per Week: Not on file  . Minutes of Exercise per Session: Not on file  Stress:   . Feeling of Stress : Not on file  Social Connections:   . Frequency of Communication with Friends and Family: Not on file  . Frequency of Social Gatherings with Friends and Family: Not on file  . Attends Religious Services: Not on file  . Active Member of Clubs or Organizations: Not on file  . Attends Archivist Meetings: Not on file  . Marital Status: Not on file  Intimate Partner Violence:   . Fear of Current or Ex-Partner: Not on file  . Emotionally Abused: Not on file  . Physically Abused: Not on file  . Sexually Abused: Not on file    Family History  Problem Relation Age of Onset  . Cancer Father        Asbestos  . Colon cancer Neg Hx   . Colon polyps Neg Hx   . Kidney disease Neg Hx   . Esophageal cancer Neg Hx   . Gallbladder disease Neg Hx   . Heart disease Neg Hx   . Diabetes Neg Hx        Objective: Vitals:   04/10/19 0854  BP: 121/70  Pulse: 85  Temp: (!) 97.3 F (36.3 C)     Physical Exam  Lab Results:  Results for orders placed or performed in visit on 04/10/19 (from the past 24 hour(s))  POCT urinalysis dipstick     Status: Abnormal   Collection Time: 04/10/19  9:04 AM  Result Value Ref Range   Color, UA yellow    Clarity, UA     Glucose, UA Positive (A) Negative   Bilirubin, UA neg    Ketones, UA neg    Spec Grav, UA 1.015 1.010 - 1.025   Blood, UA 1+    pH, UA 5.0 5.0 - 8.0   Protein, UA Positive (A) Negative   Urobilinogen, UA negative (A) 0.2 or 1.0 E.U./dL   Nitrite, UA neg    Leukocytes, UA Moderate (2+) (A) Negative   Appearance cloudy    Odor      BMET  Lab Results  Component Value Date   PSA <0.1 04/06/2019   PSA 0.69 10/26/2008   PSA 0.74 09/12/2007   Lab Results  Component Value Date   TESTOSTERONE 14 (L) 04/06/2019   TESTOSTERONE  255 (L) 05/20/2017       Studies/Results: No results found.    Assessment & Plan: Prostate cancer:  He is due for Lupron but wanted to wait a few weeks.   He will need a PSA and testosterone in about 6 months.  BOO with a history of retention:  He is voiding ok but has persistent UUI and frequency.  I will culture his urine and treat if positive.   If the culture is negative we could consider an OAB med.  Meds ordered this encounter  Medications  . Leuprolide Acetate (6 Month) (LUPRON) injection 45 mg     Orders Placed This Encounter  Procedures  . Urine culture (clean catch)    Standing Status:   Future    Standing Expiration Date:   04/09/2020  . POCT urinalysis dipstick      Return in about 4 weeks (around 05/08/2019). for Lupron injection.     CC: [name], PA-C      Irine Seal 04/10/2019

## 2019-04-12 LAB — URINE CULTURE: Culture: >=100000 — AB

## 2019-04-15 ENCOUNTER — Other Ambulatory Visit: Payer: Self-pay

## 2019-04-15 ENCOUNTER — Telehealth: Payer: Self-pay

## 2019-04-15 MED ORDER — AMOXICILLIN 500 MG PO CAPS: 500.0000 mg | ORAL_CAPSULE | Freq: Four times a day (QID) | ORAL | 0 refills | Status: DC

## 2019-04-15 NOTE — Telephone Encounter (Signed)
Called pt no answer. Sent via mychart and left message. prescription sent to [place].

## 2019-04-15 NOTE — Telephone Encounter (Signed)
-----   Message from Irine Seal, MD sent at 04/15/2019  4:31 PM EST ----- He needs amoxicillin 500mg  po qid #28.  ----- Message ----- From: Dorisann Frames, RN Sent: 04/15/2019   9:48 AM EST To: Irine Seal, MD  Culture report

## 2019-04-24 ENCOUNTER — Other Ambulatory Visit: Payer: Self-pay | Admitting: Urology

## 2019-04-24 ENCOUNTER — Telehealth: Payer: Self-pay

## 2019-04-24 DIAGNOSIS — M25571 Pain in right ankle and joints of right foot: Secondary | ICD-10-CM | POA: Diagnosis not present

## 2019-04-24 DIAGNOSIS — Z6827 Body mass index (BMI) 27.0-27.9, adult: Secondary | ICD-10-CM | POA: Diagnosis not present

## 2019-04-24 DIAGNOSIS — I10 Essential (primary) hypertension: Secondary | ICD-10-CM | POA: Diagnosis not present

## 2019-04-24 DIAGNOSIS — K5901 Slow transit constipation: Secondary | ICD-10-CM | POA: Diagnosis not present

## 2019-04-24 DIAGNOSIS — E1143 Type 2 diabetes mellitus with diabetic autonomic (poly)neuropathy: Secondary | ICD-10-CM | POA: Diagnosis not present

## 2019-04-24 DIAGNOSIS — R42 Dizziness and giddiness: Secondary | ICD-10-CM | POA: Diagnosis not present

## 2019-04-24 DIAGNOSIS — C61 Malignant neoplasm of prostate: Secondary | ICD-10-CM | POA: Diagnosis not present

## 2019-04-24 DIAGNOSIS — F411 Generalized anxiety disorder: Secondary | ICD-10-CM | POA: Diagnosis not present

## 2019-04-24 DIAGNOSIS — G5621 Lesion of ulnar nerve, right upper limb: Secondary | ICD-10-CM | POA: Diagnosis not present

## 2019-04-24 MED ORDER — OXYBUTYNIN CHLORIDE 5 MG PO TABS: 5.0000 mg | ORAL_TABLET | Freq: Three times a day (TID) | ORAL | 11 refills | Status: DC

## 2019-04-24 NOTE — Telephone Encounter (Signed)
I sent a script for Oxybutynin 5mg  tid # 90 to [facility].  If it helps and he wants 3 months worth we can send that to [facility].

## 2019-04-24 NOTE — Telephone Encounter (Addendum)
Pt. Called and made aware of new rx of oxybutynin sent to pharmacy and samples of myrbetriq to be picked up at the office. Pt. In route to office to up samples at this time.

## 2019-04-24 NOTE — Telephone Encounter (Signed)
He should be on oxybutynin 5mg  tid based on the records.   We could try adding back Myrbetriq 50mg  to that again.  He has been on that in the past.  If we have them and he would like them, he could come for samples to try.

## 2019-04-25 ENCOUNTER — Encounter (INDEPENDENT_AMBULATORY_CARE_PROVIDER_SITE_OTHER): Payer: Self-pay

## 2019-04-27 ENCOUNTER — Encounter (INDEPENDENT_AMBULATORY_CARE_PROVIDER_SITE_OTHER): Payer: Self-pay | Admitting: Internal Medicine

## 2019-04-27 ENCOUNTER — Ambulatory Visit (INDEPENDENT_AMBULATORY_CARE_PROVIDER_SITE_OTHER): Payer: Medicare Other | Admitting: Internal Medicine

## 2019-04-27 ENCOUNTER — Other Ambulatory Visit: Payer: Self-pay

## 2019-04-27 DIAGNOSIS — R140 Abdominal distension (gaseous): Secondary | ICD-10-CM

## 2019-04-27 DIAGNOSIS — K5900 Constipation, unspecified: Secondary | ICD-10-CM | POA: Diagnosis not present

## 2019-04-27 DIAGNOSIS — Z86010 Personal history of colonic polyps: Secondary | ICD-10-CM

## 2019-04-27 DIAGNOSIS — Z8711 Personal history of peptic ulcer disease: Secondary | ICD-10-CM | POA: Insufficient documentation

## 2019-04-27 DIAGNOSIS — D649 Anemia, unspecified: Secondary | ICD-10-CM

## 2019-04-27 MED ORDER — METAMUCIL SMOOTH TEXTURE 58.6 % PO POWD: 1.0000 | Freq: Every day | ORAL | Status: DC

## 2019-04-27 NOTE — Progress Notes (Addendum)
Presenting complaint;  Abdominal bloating and constipation. History of peptic ulcer disease.  History of present illness  Patient is 79 year old Afro-American male who is referred through courtesy of Ms. [name], PA-C at [facility] for GI evaluation. Patient complains of abdominal distention which he has had off and on for about 2-1/2 years. He also reports having problems with constipation for about the same duration. He states his bowels used to move like a clockwork until he had surgery for large bladder diverticulum. Now he is having bowel movement every 4 days in spite of taking polyethylene glycol. He is using Linzess/linaclotide on as-needed basis. He says if he takes it regularly his stool is loose. He states his bloating does decrease with his bowel movements. He states when he is constipated he has bloating and feels pressure across his lower chest. He also has used tap water enemas occasionally for relief. He denies chest pain or shortness of breath. When he is constipated and bloated he feels sick on her stomach but is not had any vomiting. He complains of occasional heartburn. He denies dysphagia melena or rectal bleeding. He states his appetite is fair and he is essentially steamed food. He is in the process of getting dental implants. He admits that he is not eating the foods he typically would because he does not have any teeth. He stays away from fatty and fried foods. He states few years ago he lost 30 pounds which he attributes to anxiety and depression from his illness but he has not lost any weight in the last 1 year. He gives history of multiple colonic polyps but he states his last colonoscopy by  Dr. Anthony Sar in [facility] reveals a normal examination.  Current Medications: Outpatient Encounter Medications as of 04/27/2019  Medication Sig  . alum & mag hydroxide-simeth (MAALOX/MYLANTA) 200-200-20 MG/5ML suspension Take 15 mLs by mouth every 6 (six)  hours as needed for indigestion or heartburn.  [name] amoxicillin (AMOXIL) 500 MG capsule Take 1 capsule (500 mg total) by mouth 4 (four) times daily.  [name] atenolol-chlorthalidone (TENORETIC) 50-25 MG tablet Take 1 tablet by mouth daily.  . diclofenac Sodium (VOLTAREN) 1 % GEL Apply 2 g topically 4 (four) times daily as needed (pain).  [name] gabapentin (NEURONTIN) 400 MG capsule Take 400 mg by mouth at bedtime.   . Incontinence Supply Disposable (FITTED BRIEFS MAXIMUM XL) MISC Use briefs as needed daily for incontinence  . LANTUS SOLOSTAR 100 UNIT/ML Solostar Pen Inject 5 Units into the skin daily as needed (blood sugar > 130).   [name] LINZESS 72 MCG capsule Take 72 mcg by mouth daily as needed (constipation).   . metFORMIN (GLUCOPHAGE) 500 MG tablet Take 250 mg by mouth daily as needed (feet swelling).  [name] oxybutynin (DITROPAN) 5 MG tablet Take 1 tablet (5 mg total) by mouth 3 (three) times daily.  . Oxycodone HCl 20 MG TABS Take 10 mg by mouth 3 (three) times daily.  . polyethylene glycol (MIRALAX / GLYCOLAX) 17 g packet Take 17 g by mouth daily.  . predniSONE (DELTASONE) 20 MG tablet Take 20 mg by mouth daily as needed (for gout symptoms).   . sildenafil (VIAGRA) 100 MG tablet Take 100 mg by mouth daily as needed for erectile dysfunction.   [name] SIMBRINZA 1-0.2 % SUSP Place 1 drop into both eyes daily.   . chlorhexidine (PERIDEX) 0.12 % solution 15 mLs by Mouth Rinse route 2 (two) times daily as needed (throat irritation).   [name]  ketorolac (ACULAR) 0.4 % SOLN Place 1 drop into the left eye 2 (two) times daily.   . [DISCONTINUED] belladonna-opium (B&O SUPPRETTES) 16.2-30 MG suppository Place 1 suppository (30 mg total) rectally 2 (two) times daily as needed for pain (rectal spasms). (Patient not taking: Reported on 04/27/2019)   Facility-Administered Encounter Medications as of 04/27/2019  Medication  . [START ON 05/08/2019] Leuprolide Acetate (6 Month) (LUPRON) injection 45 mg   Past medical  history  Hypertension. Hyperlipidemia Diabetes mellitus diagnosed 7 years ago. Peripheral neuropathy. History of colonic polyps. Diagnosed with peptic ulcer disease in [place] about 5 years ago. Peripheral neuropathy. History of squamous cell carcinoma of lungs. He remains in remission. He bronchoscopy in June 25, 2015 and thoracoscopy with right lobectomy in April 2017. COPD. Gout. He has chronic pain in right ankle. He has chronic numbness and third fourth and fifth finger of right hand all the way up to neck. History of kidney stones. History of lithotripsy. In December 2008.  Surgery for large acquired bladder diverticulum in December 2012. Cholecystectomy in June 24, 2013 Decompression for right carpal tunnel in April 2016. Right ankle arthroplasty in January 2017. History of prostate carcinoma status post radiation therapy in June 2017. TURP in November 2020.  Allergies Allergies  Allergen Reactions  . Ciprofloxacin Other (See Comments)    Doesn't work  . Levaquin [Levofloxacin] Other (See Comments)    Doesn't work    Family history  Mother lived to be 95. Father had cancer due to asbestos exposure and died at 50. 2 brothers are living and they are in good health. Third brother died of alcoholic cirrhosis at age 40. 23 sister died during childbirth. 3 brothers are living. 1 brother has dementia and is in nursing home. Is 78 years old. Second brother also has dementia and he is 62 years old. Fourth brother is in good health at age 23.  Social history  Patient's first wife died in 1998/06/25 because of MI. She had kidney disease. He remarried about 3 months ago. He has 2 daughters from his first marriage ages 27 and 72 in good health. He served in [employer] for 23-1/2 years. He retired in 1983/06/25. He has never smoked cigarettes or drank alcohol.   Physical examination  Blood pressure 119/72, pulse 90, temperature (!) 97.1 F (36.2 C), temperature source Temporal, height '6\' 8"'   (2.032 m), weight 251 lb (113.9 kg). Patient is alert and in no acute distress. He is wearing a facial mask. Conjunctiva is pink. Sclera is nonicteric Oropharyngeal mucosa is normal. He is edentulous. No neck masses or thyromegaly noted. Cardiac exam with regular rhythm normal S1 and S2. No murmur or gallop noted. Lungs are clear to auscultation. Abdomen is full. Bowel sounds are normal. On palpation abdomen is soft with mild periumbilical tenderness. No guarding or rebound. No organomegaly or masses. Rectal examination deferred. No LE edema or clubbing noted.  Labs/studies Results:  CBC Latest Ref Rng & Units 01/30/2019 08/02/2018 07/26/2018  WBC 4.0 - 10.5 K/uL 5.0 4.7 6.6  Hemoglobin 13.0 - 17.0 g/dL 11.9(L) 11.7(L) 11.2(L)  Hematocrit 39.0 - 52.0 % 37.4(L) 35.3(L) 33.4(L)  Platelets 150 - 400 K/uL 181 165 157    CMP Latest Ref Rng & Units 01/30/2019 08/02/2018 07/26/2018  Glucose 70 - 99 mg/dL 97 111(H) 132(H)  BUN 8 - 23 mg/dL '17 18 16  ' Creatinine 0.61 - 1.24 mg/dL 1.28(H) 1.38(H) 1.20  Sodium 135 - 145 mmol/L 140 139 141  Potassium 3.5 - 5.1 mmol/L 3.6  3.5 3.2(L)  Chloride 98 - 111 mmol/L 105 104 105  CO2 22 - 32 mmol/L '26 26 27  ' Calcium 8.9 - 10.3 mg/dL 9.2 9.0 8.9  Total Protein 6.5 - 8.1 g/dL - - -  Total Bilirubin 0.3 - 1.2 mg/dL - - -  Alkaline Phos 38 - 126 U/L - - -  AST 15 - 41 U/L - - -  ALT 0 - 44 U/L - - -    Hepatic Function Latest Ref Rng & Units 12/21/2017 12/10/2017 10/25/2016  Total Protein 6.5 - 8.1 g/dL 6.9 7.7 7.1  Albumin 3.5 - 5.0 g/dL 3.4(L) 3.8 3.6  AST 15 - 41 U/L '18 21 19  ' ALT 0 - 44 U/L '14 20 17  ' Alk Phosphatase 38 - 126 U/L 46 46 40  Total Bilirubin 0.3 - 1.2 mg/dL 0.5 0.6 0.4  Bilirubin, Direct 0.1 - 0.5 mg/dL - - <0.1(L)     Assessment:  #1. Abdominal bloating appears to be secondary to constipation. His bowels are moving every fourth day despite the fact that he is taking polyethylene glycol. He needs to be using Linzess/linaclotide more often  then on as-needed basis. I suspect his inability to eat fruits and vegetables since he is not able to chew is also contributing to his symptoms.  #2. History of peptic ulcer disease. This diagnosis was apparently made but he had endoscopy in [place] about 5 years ago. H. pylori status is unknown.  He does not have any symptoms to suggest peptic ulcer disease.  #3. History of hiatal hernia. He is not having frequent heartburn or regurgitation.  #4. History of colonic polyps. Last colonoscopy about 3 years ago and the patient was normal.  #5. Anemia. Hemoglobin 10 weeks ago was 11.6. No history of melena or rectal bleeding. Suspect he has anemia of chronic disease.   Recommendations  Request esophagogastroduodenoscopy records from [facility] and colonoscopy records from [facility]. Discontinue polyethylene glycol. Metamucil 4 g by mouth daily at bedtime. Take Linzess/linaclotide 72 mcg by mouth every morning or every other morning. Further recommendations will be made after review of GI records. Office visit in 2 months.  Addendum.  Colonoscopy records reviewed.  Colonoscopy performed on 04/15/2017 and no abnormality noted(Dr. Anthony Sar, [facility]). Esophagogastroduodenoscopy on 11/05/2016 revealed prepyloric ulcer and gastric polyps.  Gastric biopsy was negative for H. Pylori(Dr. [place], in [place]).  Results reviewed with patient. Will proceed with esophagogastroduodenoscopy.

## 2019-04-27 NOTE — Patient Instructions (Signed)
Metamucil half a packet or 4 g by mouth daily at bedtime. Take Linzess daily or every other day. Will request records of prior colonoscopy and endoscopy before further recommendations made.

## 2019-05-04 ENCOUNTER — Other Ambulatory Visit (INDEPENDENT_AMBULATORY_CARE_PROVIDER_SITE_OTHER): Payer: Self-pay | Admitting: *Deleted

## 2019-05-04 ENCOUNTER — Encounter (INDEPENDENT_AMBULATORY_CARE_PROVIDER_SITE_OTHER): Payer: Self-pay | Admitting: *Deleted

## 2019-05-04 DIAGNOSIS — Z8711 Personal history of peptic ulcer disease: Secondary | ICD-10-CM

## 2019-05-04 DIAGNOSIS — R109 Unspecified abdominal pain: Secondary | ICD-10-CM

## 2019-05-22 ENCOUNTER — Ambulatory Visit (INDEPENDENT_AMBULATORY_CARE_PROVIDER_SITE_OTHER): Payer: Medicare Other | Admitting: Urology

## 2019-05-22 ENCOUNTER — Encounter: Payer: Self-pay | Admitting: Urology

## 2019-05-22 ENCOUNTER — Other Ambulatory Visit: Payer: Self-pay

## 2019-05-22 VITALS — BP 105/65 | HR 66 | Temp 97.5°F | Ht >= 80 in | Wt 243.0 lb

## 2019-05-22 DIAGNOSIS — N3941 Urge incontinence: Secondary | ICD-10-CM

## 2019-05-22 DIAGNOSIS — R351 Nocturia: Secondary | ICD-10-CM

## 2019-05-22 DIAGNOSIS — N5201 Erectile dysfunction due to arterial insufficiency: Secondary | ICD-10-CM

## 2019-05-22 DIAGNOSIS — Z8546 Personal history of malignant neoplasm of prostate: Secondary | ICD-10-CM | POA: Diagnosis not present

## 2019-05-22 LAB — POCT URINALYSIS DIPSTICK
Bilirubin, UA: NEGATIVE
Glucose, UA: NEGATIVE
Ketones, UA: NEGATIVE
Nitrite, UA: NEGATIVE
Protein, UA: POSITIVE — AB
Spec Grav, UA: 1.02 (ref 1.010–1.025)
Urobilinogen, UA: 0.2 E.U./dL
pH, UA: 6.5 (ref 5.0–8.0)

## 2019-05-22 MED ORDER — LEUPROLIDE ACETATE (6 MONTH) 45 MG IM KIT
45.0000 mg | PACK | Freq: Once | INTRAMUSCULAR | Status: AC
  Administered 2019-05-22: 45 mg via INTRAMUSCULAR

## 2019-05-22 NOTE — Progress Notes (Signed)
Subjective:  1. History of prostate cancer   2. Erectile dysfunction due to arterial insufficiency   3. Urge incontinence   4. Nocturia     Jonathan [name] returns today in f/u. He has a history of urinary retention following radiation therapy and required prolonged use of an SP tube followed by a TURP on 02/03/19.  He is doing better with no further pain or spasms.  He has persistent frequency and urgency with UUI but is down to 1 depends.  He remains on oxybutynin tid.   His nocturia is down to 4x.   He has a good stream and he feels he empties well.  He has some odor to his urine.  He has mild dysuria.  He has no hematuria.   His IPSS is 19/5.      He has a history of T3b N0 M0 Gleason 9 prostate cancer that was treated with EXRT and ADT.   He completed EXRT in 9/19.   He is overdue for a Lupron injection which was last given in 5/20.  His PSA in January was <0.1 and his testosterone is 14.      He has been using some Sildenafil for ED with some benefit.     ROS:  ROS:  A complete review of systems was performed.  All systems are negative except for pertinent findings as noted.   Review of Systems  Constitutional: Positive for weight loss.  Cardiovascular: Positive for leg swelling.  Gastrointestinal: Positive for constipation.  Musculoskeletal: Positive for back pain.  Skin: Positive for itching.  Psychiatric/Behavioral: Positive for depression. The patient is nervous/anxious.   All other systems reviewed and are negative.   Allergies  Allergen Reactions  . Ciprofloxacin Other (See Comments)    Doesn't work  . Levaquin [Levofloxacin] Other (See Comments)    Doesn't work    Outpatient Encounter Medications as of 05/22/2019  Medication Sig  . atenolol-chlorthalidone (TENORETIC) 50-25 MG tablet Take 1 tablet by mouth at bedtime.   . diclofenac Sodium (VOLTAREN) 1 % GEL Apply 2 g topically 4 (four) times daily as needed (pain).  [name] escitalopram (LEXAPRO) 5 MG tablet Take 5 mg by  mouth daily.  [name] gabapentin (NEURONTIN) 400 MG capsule Take 400 mg by mouth at bedtime.   . Incontinence Supply Disposable (FITTED BRIEFS MAXIMUM XL) MISC Use briefs as needed daily for incontinence  . LANTUS SOLOSTAR 100 UNIT/ML Solostar Pen Inject 2.5 Units into the skin daily.   [name] LINZESS 72 MCG capsule   . metoCLOPramide (REGLAN) 5 MG tablet Take 5 mg by mouth in the morning, at noon, and at bedtime.  . mupirocin cream (BACTROBAN) 2 % Apply 1 application topically 2 (two) times daily as needed (wound care).  [name] oxybutynin (DITROPAN) 5 MG tablet Take 1 tablet (5 mg total) by mouth 3 (three) times daily.  . Oxycodone HCl 20 MG TABS Take 5 mg by mouth in the morning, at noon, in the evening, and at bedtime.   . potassium chloride (KLOR-CON) 10 MEQ tablet Take 10 mEq by mouth daily.  . predniSONE (DELTASONE) 20 MG tablet Take 20 mg by mouth at bedtime.   . psyllium (METAMUCIL SMOOTH TEXTURE) 58.6 % powder Take 1 packet by mouth daily. (Patient taking differently: Take 1 packet by mouth every other day. )  . sildenafil (VIAGRA) 100 MG tablet Take 100 mg by mouth daily as needed for erectile dysfunction.   . timolol (TIMOPTIC) 0.5 % ophthalmic solution Place 1 drop into both  eyes 2 (two) times daily as needed (eye pressure).  . [EXPIRED] Leuprolide Acetate (6 Month) (LUPRON) injection 45 mg   . [DISCONTINUED] Leuprolide Acetate (6 Month) (LUPRON) injection 45 mg    No facility-administered encounter medications on file as of 05/22/2019.    Past Medical History:  Diagnosis Date  . Acquired bladder diverticulum    12/ 2012  resection diverticulum done at [facility] in [place], [place]  . Age-related cataract of right eye    scheduled for catarat extraction 09-12-2017  . Agent orange exposure   . Aneurysm of infrarenal abdominal aorta ([place])    first dx 2017--- last CT 06-11-2017  measures 3.5cm  . Chronic constipation   . COPD with emphysema ([place])    06--12-2017  per pt no symptoms since Feb 2019  .  Depression    PTSD  . Diabetes mellitus type 2, diet-controlled ([place])   . Diabetic neuropathy ([place])   . Dyslipidemia   . Dyspnea on exertion   . Erectile dysfunction   . Feeling of incomplete bladder emptying   . GERD (gastroesophageal reflux disease)   . Gout    09-02-2017 last flare up 3 months ago  . Hiatal hernia   . History of atrial fibrillation    episode post op right lung lobectomy 07-04-2015  . History of bladder stone   . History of colon polyps   . History of DVT of lower extremity    03/ 2019  bilateral lower extremity (superficial) ---  per treated w/ oral medication   . History of gastritis   . History of kidney stones   . History of radiation therapy 09-14-2015  to 09-21-2015   left upper lung nodule -- 54Gy in 3 fractions (18 Gy per fraction)  . Hyperplasia of prostate with lower urinary tract symptoms (LUTS)   . Hypertension   . Lumbar spinal stenosis   . Nephrolithiasis   . Neurogenic bladder   . Osteoarthritis    ankle, hands  . Osteoporosis   . Prostate cancer [facility]) urologist-  dr Bolivar Koranda/  oncologist-  dr Tammi Klippel   dx 05-24-2017-- Stage T2b,  Gleason 4+4,  PSA 22.41,  vol 38cc--- started ADT 04/ 2019,  plan external radiation therapy  . Radiation fibrosis of lung ([place])    hx left upper long nodule SBRT 06/ 2017  . Squamous cell carcinoma of both lungs ([place]) last CT in epic dated 06-26-2017 no recurrence   dx 03/ 2017  non-small cell SCC via bronchoscopy w/ bx's by dr Roxan Hockey---  s/p  right VATS w/ right lobectomy and node dissection's 07-04-2015 /  pt had SBRT to left upper nodule Stage 1 ([facility]) completed 09-21-2015  . Wears dentures    bottom  . Wears glasses     Past Surgical History:  Procedure Laterality Date  . CARPAL TUNNEL RELEASE Right 07/09/2014   Procedure: RIGHT OPEN CARPAL TUNNEL RELEASE;  Surgeon: Mcarthur Rossetti, MD;  Location: [facility];  Service: Orthopedics;  Laterality: Right;  . CHOLECYSTECTOMY N/A 02/24/2014    Procedure: LAPAROSCOPIC CHOLECYSTECTOMY;  Surgeon: Coralie Keens, MD;  Location: [place];  Service: General;  Laterality: N/A;  . COLONOSCOPY    . CYSTOLITHOT[facility]  [facility] [place]  . CYSTOSCOPY N/A 04/18/2018   Procedure: CYSTOSCOPY FLEXIBLE;  Surgeon: Irine Seal, MD;  Location: [facility];  Service: Urology;  Laterality: N/A;  . CYSTOSCOPY W/ LITHOLAPAXY / EHL  02-24-2007   dr Janice Norrie  [facility]  . dental implant     No teeth at this time waiting for them to be made as of 01-30-19  . GOLD SEED IMPLANT N/A 09/05/2017   Procedure: GOLD SEED IMPLANT;  Surgeon: Irine Seal, MD;  Location: [facility];  Service: Urology;  Laterality: N/A;  . INSERTION OF SUPRAPUBIC CATHETER N/A 04/18/2018   Procedure: SUPRAPUBIC TUBE CHANGE;  Surgeon: Irine Seal, MD;  Location: [facility];  Service: Urology;  Laterality: N/A;  . IR CATHETER TUBE CHANGE  02/24/2018  . SPACE OAR INSTILLATION N/A 09/05/2017   Procedure: SPACE OAR INSTILLATION;  Surgeon: Irine Seal, MD;  Location: [facility];  Service: Urology;  Laterality: N/A;  . TOTAL ANKLE ARTHROPLASTY Right 04/20/2015   Procedure: TOTAL ANKLE ARTHOPLASTY;  Surgeon: Newt Minion, MD;  Location: [place];  Service: Orthopedics;  Laterality: Right;  . TRANSTHORACIC ECHOCARDIOGRAM  11/16/2012   ef 55-60%,  grade 1 diastolic dysfunction/  mild dilated ascending aorta/  mild MR/ trivial TR  . TRANSURETHRAL RESECTION OF PROSTATE N/A 02/03/2019   Procedure: TRANSURETHRAL RESECTION OF THE PROSTATE (TURP);  Surgeon: Irine Seal, MD;  [facility];  Service: Urology;  Laterality: N/A;  . VIDEO ASSISTED THORACOSCOPY (VATS)/ LOBECTOMY Right 07/04/2015   Procedure: VIDEO ASSISTED THORACOSCOPY (VATS)/ RIGHT UPPER LOBECTOMY;  Surgeon: Melrose Nakayama, MD;  Location: [place];  Service: Thoracic;  Laterality: Right;  [name] VIDEO BRONCHOSCOPY N/A 06/17/2015   Procedure: VIDEO BRONCHOSCOPY;  Surgeon: Melrose Nakayama, MD;  Location:  [place];  Service: Thoracic;  Laterality: N/A;  . VIDEO BRONCHOSCOPY WITH ENDOBRONCHIAL NAVIGATION N/A 06/17/2015   Procedure: VIDEO BRONCHOSCOPY WITH ENDOBRONCHIAL NAVIGATION;  Surgeon: Melrose Nakayama, MD;  Location: [place];  Service: Thoracic;  Laterality: N/A;  . VIDEO BRONCHOSCOPY WITH ENDOBRONCHIAL ULTRASOUND N/A 06/17/2015   Procedure: VIDEO BRONCHOSCOPY WITH ENDOBRONCHIAL ULTRASOUND;  Surgeon: Melrose Nakayama, MD;  Location: [place];  Service: Thoracic;  Laterality: N/A;    Social History   Socioeconomic History  . Marital status: Widowed    Spouse name: Not on file  . Number of children: 2  . Years of education: college  . Highest education level: Not on file  Occupational History  . Occupation: retired  Tobacco Use  . Smoking status: Former Smoker    Packs/day: 0.50    Years: 40.00    Pack years: 20.00    Types: Cigarettes    Quit date: 02/13/2015    Years since quitting: 4.2  . Smokeless tobacco: Never Used  Substance and Sexual Activity  . Alcohol use: No    Alcohol/week: 0.0 standard drinks  . Drug use: No  . Sexual activity: Not Currently  Other Topics Concern  . Not on file  Social History Narrative  . Not on file   Social Determinants of Health   Financial Resource Strain:   . Difficulty of Paying Living Expenses: Not on file  Food Insecurity:   . Worried About Charity fundraiser in the Last Year: Not on file  . Ran Out of Food in the Last Year: Not on file  Transportation Needs:   . Lack of Transportation (Medical): Not on file  . Lack of Transportation (Non-Medical): Not on file  Physical Activity:   . Days of Exercise per Week: Not on file  . Minutes of Exercise per Session: Not on file  Stress:   . Feeling of Stress : Not on file  Social Connections:   . Frequency of Communication  with Friends and Family: Not on file  . Frequency of Social Gatherings with Friends and Family: Not on file  . Attends Religious Services: Not on file  . Active  Member of Clubs or Organizations: Not on file  . Attends Archivist Meetings: Not on file  . Marital Status: Not on file  Intimate Partner Violence:   . Fear of Current or Ex-Partner: Not on file  . Emotionally Abused: Not on file  . Physically Abused: Not on file  . Sexually Abused: Not on file    Family History  Problem Relation Age of Onset  . Cancer Father        Asbestos  . Colon cancer Neg Hx   . Colon polyps Neg Hx   . Kidney disease Neg Hx   . Esophageal cancer Neg Hx   . Gallbladder disease Neg Hx   . Heart disease Neg Hx   . Diabetes Neg Hx        Objective: Vitals:   05/22/19 1126  BP: 105/65  Pulse: 66  Temp: (!) 97.5 F (36.4 C)     Physical Exam  Lab Results:  No results found for this or any previous visit (from the past 24 hour(s)).  BMET No results for input(s): NA, K, CL, CO2, GLUCOSE, BUN, CREATININE, CALCIUM in the last 72 hours. PSA PSA  Date Value Ref Range Status  04/06/2019 <0.1 < OR = 4.0 ng/mL Final    Comment:    The total PSA value from this assay system is  standardized against the WHO standard. The test  result will be approximately 20% lower when compared  to the equimolar-standardized total PSA (Beckman  Coulter). Comparison of serial PSA results should be  interpreted with this fact in mind. . This test was performed using the Siemens  chemiluminescent method. Values obtained from  different assay methods cannot be used interchangeably. PSA levels, regardless of value, should not be interpreted as absolute evidence of the presence or absence of disease.   10/26/2008 0.69 0.10 - 4.00 ng/mL Final    Comment:    See lab report for associated comment(s)  09/12/2007 0.74 0.10 - 4.00 ng/mL Final    Comment:    See lab report for associated comment(s)   Testosterone  Date Value Ref Range Status  04/06/2019 14 (L) 250 - 827 ng/dL Final    Comment:    In hypogonadal males, Testosterone, Total, LC/MS/MS, is the  recommended assay due to the diminished accuracy of immunoassay at levels below 250 ng/dL. This test code [phone]) must be collected in a red-top tube with no gel.    05/20/2017 255 (L) 264 - 916 ng/dL Final    Comment:    (NOTE) Adult male reference interval is based on a population of healthy nonobese males (BMI <30) between 41 and 61 years old. [place], [place] [phone]. PMID: [id number]. Performed At: [facility] [place], [place] [id number] Rush Farmer MD GY:[id number] Performed at [facility], [address] [place], [place] [address]       Studies/Results: No results found.    Assessment & Plan: Prostate cancer.   He received what will be his last Lupron today.   He will return in 3 months with a PSA and testosterone.  OAB.  His symptoms are gradually improving.  He remains on Oxybutynin.    ED.  He is responding to sildenafil but had questions about a vacuum pump which I informed him could  be obtained OTC without a script.     Meds ordered this encounter  Medications  . Leuprolide Acetate (6 Month) (LUPRON) injection 45 mg     Orders Placed This Encounter  Procedures  . PSA    Standing Status:   Future    Standing Expiration Date:   05/21/2020  . Testosterone    Standing Status:   Future    Standing Expiration Date:   05/21/2020  . POCT urinalysis dipstick      Return in about 3 months (around 08/19/2019) for for prostate cancer.  .   CC: [name], PA-C      Irine Seal 05/22/2019

## 2019-05-26 NOTE — Patient Instructions (Signed)
Jonathan [name]  05/26/2019     @PREFPERIOPPHARMACY @   Your procedure is scheduled on  05/29/2019 .  Report to [facility] at  0800  A.M.  Call this number if you have problems the morning of surgery:  [phone]   Remember:  Follow the diet instructions given to you by Dr Olevia Perches office.                     Take these medicines the morning of surgery with A SIP OF WATER  Lexapro, reglan, oxybutynin, oxycodone.    Do not wear jewelry, make-up or nail polish.  Do not wear lotions, powders, or perfumes. Please wear deodroant and brush your teeth,  Do not shave 48 hours prior to surgery.  Men may shave face and neck.  Do not bring valuables to the hospital.  [facility] is not responsible for any belongings or valuables.  Contacts, dentures or bridgework may not be worn into surgery.  Leave your suitcase in the car.  After surgery it may be brought to your room.  For patients admitted to the hospital, discharge time will be determined by your treatment team.  Patients discharged the day of surgery will not be allowed to drive home.   Name and phone number of your driver:  family Special instructions:  DO NOT smoke the morning of your procedure.  Please read over the following fact sheets that you were given. Anesthesia Post-op Instructions and Care and Recovery After Surgery       Upper Endoscopy, Adult, Care After This sheet gives you information about how to care for yourself after your procedure. Your health care provider may also give you more specific instructions. If you have problems or questions, contact your health care provider. What can I expect after the procedure? After the procedure, it is common to have:  A sore throat.  Mild stomach pain or discomfort.  Bloating.  Nausea. Follow these instructions at home:   Follow instructions from your health care provider about what to eat or drink after your procedure.  Return to your normal  activities as told by your health care provider. Ask your health care provider what activities are safe for you.  Take over-the-counter and prescription medicines only as told by your health care provider.  Do not drive for 24 hours if you were given a sedative during your procedure.  Keep all follow-up visits as told by your health care provider. This is important. Contact a health care provider if you have:  A sore throat that lasts longer than one day.  Trouble swallowing. Get help right away if:  You vomit blood or your vomit looks like coffee grounds.  You have: ? A fever. ? Bloody, black, or tarry stools. ? A severe sore throat or you cannot swallow. ? Difficulty breathing. ? Severe pain in your chest or abdomen. Summary  After the procedure, it is common to have a sore throat, mild stomach discomfort, bloating, and nausea.  Do not drive for 24 hours if you were given a sedative during the procedure.  Follow instructions from your health care provider about what to eat or drink after your procedure.  Return to your normal activities as told by your health care provider. This information is not intended to replace advice given to you by your health care provider. Make sure you discuss any questions you have with your health care provider. Document Revised: 09/03/2017 Document  Reviewed: 08/12/2017 Elsevier Patient Education  [place] After These instructions provide you with information about caring for yourself after your procedure. Your health care provider may also give you more specific instructions. Your treatment has been planned according to current medical practices, but problems sometimes occur. Call your health care provider if you have any problems or questions after your procedure. What can I expect after the procedure? After your procedure, you may:  Feel sleepy for several hours.  Feel clumsy and have poor balance  for several hours.  Feel forgetful about what happened after the procedure.  Have poor judgment for several hours.  Feel nauseous or vomit.  Have a sore throat if you had a breathing tube during the procedure. Follow these instructions at home: For at least 24 hours after the procedure:      Have a responsible adult stay with you. It is important to have someone help care for you until you are awake and alert.  Rest as needed.  Do not: ? Participate in activities in which you could fall or become injured. ? Drive. ? Use heavy machinery. ? Drink alcohol. ? Take sleeping pills or medicines that cause drowsiness. ? Make important decisions or sign legal documents. ? Take care of children on your own. Eating and drinking  Follow the diet that is recommended by your health care provider.  If you vomit, drink water, juice, or soup when you can drink without vomiting.  Make sure you have little or no nausea before eating solid foods. General instructions  Take over-the-counter and prescription medicines only as told by your health care provider.  If you have sleep apnea, surgery and certain medicines can increase your risk for breathing problems. Follow instructions from your health care provider about wearing your sleep device: ? Anytime you are sleeping, including during daytime naps. ? While taking prescription pain medicines, sleeping medicines, or medicines that make you drowsy.  If you smoke, do not smoke without supervision.  Keep all follow-up visits as told by your health care provider. This is important. Contact a health care provider if:  You keep feeling nauseous or you keep vomiting.  You feel light-headed.  You develop a rash.  You have a fever. Get help right away if:  You have trouble breathing. Summary  For several hours after your procedure, you may feel sleepy and have poor judgment.  Have a responsible adult stay with you for at least 24 hours  or until you are awake and alert. This information is not intended to replace advice given to you by your health care provider. Make sure you discuss any questions you have with your health care provider. Document Revised: 06/10/2017 Document Reviewed: 07/03/2015 Elsevier Patient Education  [place].

## 2019-05-27 ENCOUNTER — Other Ambulatory Visit (HOSPITAL_COMMUNITY)
Admission: RE | Admit: 2019-05-27 | Discharge: 2019-05-27 | Disposition: A | Discharge status: Home / Self Care | Payer: Medicare Other | Source: Ambulatory Visit | Attending: Internal Medicine | Admitting: Internal Medicine

## 2019-05-27 ENCOUNTER — Other Ambulatory Visit: Payer: Self-pay

## 2019-05-27 ENCOUNTER — Encounter (HOSPITAL_COMMUNITY): Payer: Self-pay

## 2019-05-27 ENCOUNTER — Encounter (HOSPITAL_COMMUNITY)
Admission: RE | Admit: 2019-05-27 | Discharge: 2019-05-27 | Disposition: A | Discharge status: Home / Self Care | Payer: Medicare Other | Source: Ambulatory Visit | Attending: Internal Medicine | Admitting: Internal Medicine

## 2019-05-27 DIAGNOSIS — Z01812 Encounter for preprocedural laboratory examination: Secondary | ICD-10-CM | POA: Insufficient documentation

## 2019-05-27 DIAGNOSIS — Z20822 Contact with and (suspected) exposure to covid-19: Secondary | ICD-10-CM | POA: Insufficient documentation

## 2019-05-27 LAB — SARS CORONAVIRUS 2 (TAT 6-24 HRS): SARS Coronavirus 2: NEGATIVE

## 2019-05-28 ENCOUNTER — Encounter (HOSPITAL_COMMUNITY): Payer: Self-pay

## 2019-05-28 ENCOUNTER — Other Ambulatory Visit: Payer: Self-pay

## 2019-05-28 ENCOUNTER — Encounter (HOSPITAL_COMMUNITY)
Admission: RE | Admit: 2019-05-28 | Discharge: 2019-05-28 | Disposition: A | Discharge status: Home / Self Care | Payer: Medicare Other | Source: Ambulatory Visit | Attending: Internal Medicine | Admitting: Internal Medicine

## 2019-05-28 DIAGNOSIS — R109 Unspecified abdominal pain: Secondary | ICD-10-CM

## 2019-05-28 DIAGNOSIS — Z01812 Encounter for preprocedural laboratory examination: Secondary | ICD-10-CM | POA: Insufficient documentation

## 2019-05-28 DIAGNOSIS — Z8711 Personal history of peptic ulcer disease: Secondary | ICD-10-CM | POA: Diagnosis not present

## 2019-05-28 LAB — BASIC METABOLIC PANEL
Anion gap: 11 (ref 5–15)
BUN: 18 mg/dL (ref 8–23)
CO2: 26 mmol/L (ref 22–32)
Calcium: 8.9 mg/dL (ref 8.9–10.3)
Chloride: 100 mmol/L (ref 98–111)
Creatinine, Ser: 1.22 mg/dL (ref 0.61–1.24)
GFR calc Af Amer: >60 mL/min (ref >60)
GFR calc non Af Amer: 57 mL/min — ABNORMAL LOW (ref >60)
Glucose, Bld: 127 mg/dL — ABNORMAL HIGH (ref 70–99)
Potassium: 3.3 mmol/L — ABNORMAL LOW (ref 3.5–5.1)
Sodium: 137 mmol/L (ref 135–145)

## 2019-05-29 ENCOUNTER — Encounter (HOSPITAL_COMMUNITY): Payer: Self-pay | Admitting: Internal Medicine

## 2019-05-29 ENCOUNTER — Ambulatory Visit (HOSPITAL_COMMUNITY): Payer: Medicare Other | Admitting: Anesthesiology

## 2019-05-29 ENCOUNTER — Ambulatory Visit (HOSPITAL_COMMUNITY)
Admission: RE | Admit: 2019-05-29 | Discharge: 2019-05-29 | Disposition: A | Discharge status: Home / Self Care | Payer: Medicare Other | Attending: Internal Medicine | Admitting: Internal Medicine

## 2019-05-29 ENCOUNTER — Other Ambulatory Visit: Payer: Self-pay

## 2019-05-29 ENCOUNTER — Encounter (HOSPITAL_COMMUNITY)
Admission: RE | Disposition: A | Discharge status: Home / Self Care | Payer: Self-pay | Source: Home / Self Care | Attending: Internal Medicine

## 2019-05-29 DIAGNOSIS — K319 Disease of stomach and duodenum, unspecified: Secondary | ICD-10-CM | POA: Insufficient documentation

## 2019-05-29 DIAGNOSIS — E1140 Type 2 diabetes mellitus with diabetic neuropathy, unspecified: Secondary | ICD-10-CM | POA: Insufficient documentation

## 2019-05-29 DIAGNOSIS — Z86718 Personal history of other venous thrombosis and embolism: Secondary | ICD-10-CM | POA: Diagnosis not present

## 2019-05-29 DIAGNOSIS — Z7952 Long term (current) use of systemic steroids: Secondary | ICD-10-CM | POA: Diagnosis not present

## 2019-05-29 DIAGNOSIS — Z85118 Personal history of other malignant neoplasm of bronchus and lung: Secondary | ICD-10-CM | POA: Insufficient documentation

## 2019-05-29 DIAGNOSIS — Z79891 Long term (current) use of opiate analgesic: Secondary | ICD-10-CM | POA: Insufficient documentation

## 2019-05-29 DIAGNOSIS — Z79899 Other long term (current) drug therapy: Secondary | ICD-10-CM | POA: Insufficient documentation

## 2019-05-29 DIAGNOSIS — F329 Major depressive disorder, single episode, unspecified: Secondary | ICD-10-CM | POA: Diagnosis not present

## 2019-05-29 DIAGNOSIS — K259 Gastric ulcer, unspecified as acute or chronic, without hemorrhage or perforation: Secondary | ICD-10-CM | POA: Diagnosis not present

## 2019-05-29 DIAGNOSIS — R109 Unspecified abdominal pain: Secondary | ICD-10-CM | POA: Insufficient documentation

## 2019-05-29 DIAGNOSIS — I10 Essential (primary) hypertension: Secondary | ICD-10-CM | POA: Diagnosis not present

## 2019-05-29 DIAGNOSIS — R140 Abdominal distension (gaseous): Secondary | ICD-10-CM | POA: Insufficient documentation

## 2019-05-29 DIAGNOSIS — K269 Duodenal ulcer, unspecified as acute or chronic, without hemorrhage or perforation: Secondary | ICD-10-CM | POA: Insufficient documentation

## 2019-05-29 DIAGNOSIS — Z96661 Presence of right artificial ankle joint: Secondary | ICD-10-CM | POA: Insufficient documentation

## 2019-05-29 DIAGNOSIS — Z8546 Personal history of malignant neoplasm of prostate: Secondary | ICD-10-CM | POA: Diagnosis not present

## 2019-05-29 DIAGNOSIS — Z923 Personal history of irradiation: Secondary | ICD-10-CM | POA: Diagnosis not present

## 2019-05-29 DIAGNOSIS — Z87891 Personal history of nicotine dependence: Secondary | ICD-10-CM | POA: Diagnosis not present

## 2019-05-29 DIAGNOSIS — J439 Emphysema, unspecified: Secondary | ICD-10-CM | POA: Diagnosis not present

## 2019-05-29 DIAGNOSIS — K3189 Other diseases of stomach and duodenum: Secondary | ICD-10-CM

## 2019-05-29 DIAGNOSIS — J449 Chronic obstructive pulmonary disease, unspecified: Secondary | ICD-10-CM | POA: Diagnosis not present

## 2019-05-29 DIAGNOSIS — Z8711 Personal history of peptic ulcer disease: Secondary | ICD-10-CM

## 2019-05-29 DIAGNOSIS — Z794 Long term (current) use of insulin: Secondary | ICD-10-CM | POA: Diagnosis not present

## 2019-05-29 DIAGNOSIS — K2100 Gastro-esophageal reflux disease with esophagitis, without bleeding: Secondary | ICD-10-CM | POA: Diagnosis not present

## 2019-05-29 HISTORY — PX: ESOPHAGOGASTRODUODENOSCOPY (EGD) WITH PROPOFOL: SHX5813

## 2019-05-29 HISTORY — PX: BIOPSY: SHX5522

## 2019-05-29 LAB — GLUCOSE, CAPILLARY
Glucose-Capillary: 116 mg/dL — ABNORMAL HIGH (ref 70–99)
Glucose-Capillary: 101 mg/dL — ABNORMAL HIGH (ref 70–99)

## 2019-05-29 SURGERY — ESOPHAGOGASTRODUODENOSCOPY (EGD) WITH PROPOFOL
Anesthesia: General

## 2019-05-29 MED ORDER — CHLORHEXIDINE GLUCONATE CLOTH 2 % EX PADS: 6.0000 | MEDICATED_PAD | Freq: Once | CUTANEOUS | Status: DC

## 2019-05-29 MED ORDER — KETAMINE HCL 50 MG/5ML IJ SOSY
PREFILLED_SYRINGE | INTRAMUSCULAR | Status: AC
  Filled 2019-05-29: qty 5

## 2019-05-29 MED ORDER — LACTATED RINGERS IV SOLN: Freq: Once | INTRAVENOUS | Status: DC

## 2019-05-29 MED ORDER — STERILE WATER FOR IRRIGATION IR SOLN
Status: DC | PRN
  Administered 2019-05-29: 2.5 mL

## 2019-05-29 MED ORDER — PROPOFOL 10 MG/ML IV BOLUS
INTRAVENOUS | Status: AC
  Filled 2019-05-29: qty 20

## 2019-05-29 MED ORDER — GLYCOPYRROLATE 0.2 MG/ML IJ SOLN
INTRAMUSCULAR | Status: DC | PRN
  Administered 2019-05-29: .1 mg via INTRAVENOUS

## 2019-05-29 MED ORDER — PROPOFOL 10 MG/ML IV BOLUS
INTRAVENOUS | Status: DC | PRN
  Administered 2019-05-29: 30 mg via INTRAVENOUS

## 2019-05-29 MED ORDER — KETAMINE HCL 10 MG/ML IJ SOLN
INTRAMUSCULAR | Status: DC | PRN
  Administered 2019-05-29: 40 mg via INTRAVENOUS

## 2019-05-29 MED ORDER — PANTOPRAZOLE SODIUM 40 MG PO TBEC: 40.0000 mg | DELAYED_RELEASE_TABLET | Freq: Two times a day (BID) | ORAL | 2 refills | Status: DC

## 2019-05-29 MED ORDER — LACTATED RINGERS IV SOLN: INTRAVENOUS | Status: DC | PRN

## 2019-05-29 MED ORDER — PROPOFOL 500 MG/50ML IV EMUL
INTRAVENOUS | Status: DC | PRN
  Administered 2019-05-29: 150 ug/kg/min via INTRAVENOUS

## 2019-05-29 MED ORDER — LIDOCAINE HCL (CARDIAC) PF 100 MG/5ML IV SOSY
PREFILLED_SYRINGE | INTRAVENOUS | Status: DC | PRN
  Administered 2019-05-29: 50 mg via INTRATRACHEAL

## 2019-05-29 NOTE — Anesthesia Postprocedure Evaluation (Signed)
Anesthesia Post Note  Patient: Jonathan [name]  Procedure(s) Performed: ESOPHAGOGASTRODUODENOSCOPY (EGD) WITH PROPOFOL (N/A ) BIOPSY  Patient location during evaluation: PACU Anesthesia Type: General Level of consciousness: awake and alert Pain management: pain level controlled Respiratory status: spontaneous breathing, nonlabored ventilation and respiratory function stable Cardiovascular status: stable Postop Assessment: no apparent nausea or vomiting Anesthetic complications: no     Last Vitals:  Vitals:   05/29/19 0917  BP: 130/78  Pulse: 60  Resp: (!) 22  Temp: 36.7 C  SpO2: 100%    Last Pain:  Vitals:   05/29/19 0917  TempSrc: Oral  PainSc: 0-No pain                 Marga Gramajo Hristova

## 2019-05-29 NOTE — Op Note (Addendum)
[facility] Patient Name: Jonathan [name] Procedure Date: 05/29/2019 8:06 AM MRN: [id number] Date of Birth: [birth year] Attending MD: Hildred Laser , MD CSN: [id number] Age: 79 Admit Type: Outpatient Procedure:                Upper GI endoscopy Indications:              Abdominal pain, Abdominal bloating Providers:                Hildred Laser, MD, Lurline Del, RN, Raphael Gibney,                            Technician Referring MD:             [name], [facility] Medicines:                Propofol per Anesthesia Complications:            No immediate complications. Estimated Blood Loss:     Estimated blood loss was minimal. Procedure:                Pre-Anesthesia Assessment:                           - Prior to the procedure, a History and Physical                            was performed, and patient medications and                            allergies were reviewed. The patient's tolerance of                            previous anesthesia was also reviewed. The risks                            and benefits of the procedure and the sedation                            options and risks were discussed with the patient.                            All questions were answered, and informed consent                            was obtained. Prior Anticoagulants: The patient has                            taken no previous anticoagulant or antiplatelet                            agents. ASA Grade Assessment: III - A patient with                            severe systemic disease. After reviewing the risks  and benefits, the patient was deemed in                            satisfactory condition to undergo the procedure.                           After obtaining informed consent, the endoscope was                            passed under direct vision. Throughout the                            procedure, the patient's blood pressure, pulse, and      oxygen saturations were monitored continuously. The                            [id number]-H190 ([id number]) scope was introduced through the                            mouth, and advanced to the second part of duodenum.                            The upper GI endoscopy was accomplished without                            difficulty. The patient tolerated the procedure                            well. Scope In: Scope Out: 9:45:42 AM Findings:      The hypopharynx was normal.      The examined esophagus was normal except distally.      LA Grade A (one or more mucosal breaks less than 5 mm, not extending       between tops of 2 mucosal folds) esophagitis was found 44 cm from the       incisors.      A few erosions with no stigmata of recent bleeding were found in the       gastric antrum.      One non-bleeding cratered gastric ulcer with no stigmata of bleeding was       found in the prepyloric region of the stomach. The lesion was 3 mm in       largest dimension. Biopsies were taken with a cold forceps for       histology. The pathology specimen was placed into Bottle Number 1.      The cardia, gastric fundus and gastric body were normal.      Three non-bleeding superficial duodenal ulcers with no stigmata of       bleeding were found in the duodenal bulb. The largest lesion was four mm       by six mm in largest dimension.      The second portion of the duodenum was normal. Impression:               - Normal hypopharynx.                           -  Normal esophagus except distal esophagus.                           - LA Grade A reflux esophagitis at GEJ.                           - Erosive gastropathy with no stigmata of recent                            bleeding.                           - Non-bleeding gastric ulcer with no stigmata of                            bleeding. Biopsied.                           - Normal cardia, gastric fundus and gastric body.                           -  Non-bleeding duodenal ulcers with no stigmata of                            bleeding.                           - Normal second portion of the duodenum. Moderate Sedation:      Per Anesthesia Care Recommendation:           - Patient has a contact number available for                            emergencies. The signs and symptoms of potential                            delayed complications were discussed with the                            patient. Return to normal activities tomorrow.                            Written discharge instructions were provided to the                            patient.                           - Resume previous diet today.                           - Continue present medications.                           - NO NSAIDs.                           -  Pantoprazole 40 mg po bid.                           - Await pathology results.                           - Repeat upper endoscopy in 3 months to check                            healing. Procedure Code(s):        --- Professional ---                           [phone], Esophagogastroduodenoscopy, flexible,                            transoral; with biopsy, single or multiple Diagnosis Code(s):        --- Professional ---                           K21.00, Gastro-esophageal reflux disease with                            esophagitis, without bleeding                           K31.89, Other diseases of stomach and duodenum                           K25.9, Gastric ulcer, unspecified as acute or                            chronic, without hemorrhage or perforation                           K26.9, Duodenal ulcer, unspecified as acute or                            chronic, without hemorrhage or perforation                           R10.9, Unspecified abdominal pain                           R14.0, Abdominal distension (gaseous) CPT copyright 2019 American Medical [employer]. All rights reserved. The codes documented in  this report are preliminary and upon coder review may  be revised to meet current compliance requirements. Hildred Laser, MD Hildred Laser, MD 05/29/2019 9:58:55 AM This report has been signed electronically. Number of Addenda: 0

## 2019-05-29 NOTE — Discharge Instructions (Signed)
No aspirin or NSAIDs. Resume usual medications as before. Pantoprazole 40 mg by mouth 30 minutes before breakfast and evening meal daily. Resume usual diet. No driving for 24 hours. Physician will call with biopsy results. Repeat EGD in 3 months.    ****Jonathan [name] care coordinator at Dr. Olevia Perches office notified and she will get in touch with the patient with a scheduled date ******     Upper Endoscopy, Adult, Care After This sheet gives you information about how to care for yourself after your procedure. Your health care provider may also give you more specific instructions. If you have problems or questions, contact your health care provider. What can I expect after the procedure? After the procedure, it is common to have:  A sore throat.  Mild stomach pain or discomfort.  Bloating.  Nausea. Follow these instructions at home:   Follow instructions from your health care provider about what to eat or drink after your procedure.  Return to your normal activities as told by your health care provider. Ask your health care provider what activities are safe for you.  Take over-the-counter and prescription medicines only as told by your health care provider.  Do not drive for 24 hours if you were given a sedative during your procedure.  Keep all follow-up visits as told by your health care provider. This is important. Contact a health care provider if you have:  A sore throat that lasts longer than one day.  Trouble swallowing. Get help right away if:  You vomit blood or your vomit looks like coffee grounds.  You have: ? A fever. ? Bloody, black, or tarry stools. ? A severe sore throat or you cannot swallow. ? Difficulty breathing. ? Severe pain in your chest or abdomen. Summary  After the procedure, it is common to have a sore throat, mild stomach discomfort, bloating, and nausea.  Do not drive for 24 hours if you were given a sedative during the procedure.  Follow  instructions from your health care provider about what to eat or drink after your procedure.  Return to your normal activities as told by your health care provider. This information is not intended to replace advice given to you by your health care provider. Make sure you discuss any questions you have with your health care provider. Document Revised: 09/03/2017 Document Reviewed: 08/12/2017 Elsevier Patient Education  2020 [place] After These instructions provide you with information about caring for yourself after your procedure. Your health care provider may also give you more specific instructions. Your treatment has been planned according to current medical practices, but problems sometimes occur. Call your health care provider if you have any problems or questions after your procedure. What can I expect after the procedure? After your procedure, you may:  Feel sleepy for several hours.  Feel clumsy and have poor balance for several hours.  Feel forgetful about what happened after the procedure.  Have poor judgment for several hours.  Feel nauseous or vomit.  Have a sore throat if you had a breathing tube during the procedure. Follow these instructions at home: For at least 24 hours after the procedure:      Have a responsible adult stay with you. It is important to have someone help care for you until you are awake and alert.  Rest as needed.  Do not: ? Participate in activities in which you could fall or become injured. ? Drive. ? Use heavy  machinery. ? Drink alcohol. ? Take sleeping pills or medicines that cause drowsiness. ? Make important decisions or sign legal documents. ? Take care of children on your own. Eating and drinking  Follow the diet that is recommended by your health care provider.  If you vomit, drink water, juice, or soup when you can drink without vomiting.  Make sure you have little or no nausea  before eating solid foods. General instructions  Take over-the-counter and prescription medicines only as told by your health care provider.  If you have sleep apnea, surgery and certain medicines can increase your risk for breathing problems. Follow instructions from your health care provider about wearing your sleep device: ? Anytime you are sleeping, including during daytime naps. ? While taking prescription pain medicines, sleeping medicines, or medicines that make you drowsy.  If you smoke, do not smoke without supervision.  Keep all follow-up visits as told by your health care provider. This is important. Contact a health care provider if:  You keep feeling nauseous or you keep vomiting.  You feel light-headed.  You develop a rash.  You have a fever. Get help right away if:  You have trouble breathing. Summary  For several hours after your procedure, you may feel sleepy and have poor judgment.  Have a responsible adult stay with you for at least 24 hours or until you are awake and alert. This information is not intended to replace advice given to you by your health care provider. Make sure you discuss any questions you have with your health care provider. Document Revised: 06/10/2017 Document Reviewed: 07/03/2015 Elsevier Patient Education  [place].

## 2019-05-29 NOTE — Anesthesia Preprocedure Evaluation (Signed)
Anesthesia Evaluation  Patient identified by MRN, date of birth, ID band Patient awake    Reviewed: Allergy & Precautions, NPO status , Patient's Chart, lab work & pertinent test results, reviewed documented beta blocker date and time   Airway Mallampati: II  TM Distance: >3 FB Neck ROM: Full    Dental  (+) Edentulous Upper, Implants, Edentulous Lower   Pulmonary COPD, former smoker,    Pulmonary exam normal breath sounds clear to auscultation       Cardiovascular Exercise Tolerance: Good hypertension, Pt. on medications and Pt. on home beta blockers Normal cardiovascular exam Rhythm:Regular Rate:Normal     Neuro/Psych PSYCHIATRIC DISORDERS Depression  Neuromuscular disease    GI/Hepatic hiatal hernia, GERD  Medicated and Controlled,  Endo/Other  diabetes, Type 2, Insulin Dependent  Renal/GU Renal disease     Musculoskeletal  (+) Arthritis ,   Abdominal Normal abdominal exam  (+)   Peds  Hematology   Anesthesia Other Findings   Reproductive/Obstetrics                                                              Anesthesia Evaluation  Patient identified by MRN, date of birth, ID band Patient awake    Reviewed: Allergy & Precautions, H&P , NPO status , Patient's Chart, lab work & pertinent test results, reviewed documented beta blocker date and time   Airway Mallampati: II  TM Distance: >3 FB Neck ROM: full    Dental no notable dental hx. (+) Poor Dentition, Missing   Pulmonary shortness of breath, COPD, former smoker,    Pulmonary exam normal breath sounds clear to auscultation       Cardiovascular Exercise Tolerance: Good hypertension, negative cardio ROS   Rhythm:regular Rate:Normal     Neuro/Psych PSYCHIATRIC DISORDERS Depression  Neuromuscular disease    GI/Hepatic Neg liver ROS, hiatal hernia, GERD  ,  Endo/Other  negative endocrine ROSdiabetes  Renal/GU Renal disease  negative genitourinary   Musculoskeletal   Abdominal   Peds  Hematology negative hematology ROS (+)   Anesthesia Other Findings   Reproductive/Obstetrics negative OB ROS                             Anesthesia Physical Anesthesia Plan  ASA: III  Anesthesia Plan: General   Post-op Pain Management:    Induction:   PONV Risk Score and Plan:   Airway Management Planned:   Additional Equipment:   Intra-op Plan:   Post-operative Plan:   Informed Consent: I have reviewed the patients History and Physical, chart, labs and discussed the procedure including the risks, benefits and alternatives for the proposed anesthesia with the patient or authorized representative who has indicated his/her understanding and acceptance.     Dental Advisory Given  Plan Discussed with: CRNA  Anesthesia Plan Comments:         Anesthesia Quick Evaluation  Anesthesia Physical  Anesthesia Plan  ASA: III  Anesthesia Plan: General   Post-op Pain Management:    Induction: Intravenous  PONV Risk Score and Plan: TIVA  Airway Management Planned: Nasal Cannula, Natural Airway and Simple Face Mask  Additional Equipment:   Intra-op Plan:   Post-operative Plan:   Informed Consent: I have reviewed the patients History and  Physical, chart, labs and discussed the procedure including the risks, benefits and alternatives for the proposed anesthesia with the patient or authorized representative who has indicated his/her understanding and acceptance.     Dental advisory given  Plan Discussed with: CRNA  Anesthesia Plan Comments:         Anesthesia Quick Evaluation

## 2019-05-29 NOTE — Transfer of Care (Signed)
Immediate Anesthesia Transfer of Care Note  Patient: Jonathan [name]  Procedure(s) Performed: ESOPHAGOGASTRODUODENOSCOPY (EGD) WITH PROPOFOL (N/A ) BIOPSY  Patient Location: PACU  Anesthesia Type:General  Level of Consciousness: awake  Airway & Oxygen Therapy: Patient Spontanous Breathing  Post-op Assessment: Report given to RN and Post -op Vital signs reviewed and stable  Post vital signs: Reviewed and stable  Last Vitals:  Vitals Value Taken Time  BP    Temp    Pulse    Resp 12 05/29/19 0959  SpO2    Vitals shown include unvalidated device data.  Last Pain:  Vitals:   05/29/19 0917  TempSrc: Oral  PainSc: 0-No pain         Complications: No apparent anesthesia complications

## 2019-05-29 NOTE — H&P (Signed)
Jonathan [name] is an 79 y.o. male.   Chief Complaint: Patient is here for esophagogastroduodenoscopy. HPI: Patient is 79 year old Afro-American male who presents with history of postprandial bloating abdominal pain and nausea.  He also has history of constipation and is on Linzess low-dose which he takes every third day.  He says his bloating does not get better when his bowels moved.  He has history of peptic ulcer disease which was diagnosed 5 years ago and [place].  H. pylori status is unknown.  No history of vomiting melena or rectal bleeding.  He is up-to-date on CRC screening.  Last exam was in 2019.  He is getting his dental work done and it is not complete yet.  He is in the process of getting dental implants.  He is on low-dose metoclopramide but he is never been evaluated for gastroparesis   Past Medical History:  Diagnosis Date  . Acquired bladder diverticulum    12/ 2012  resection diverticulum done at [facility] in [place], [place]  . Age-related cataract of right eye    scheduled for catarat extraction 09-12-2017  . Agent orange exposure   . Aneurysm of infrarenal abdominal aorta ([place])    first dx 2017--- last CT 06-11-2017  measures 3.5cm  . Chronic constipation   . COPD with emphysema ([place])    06--12-2017  per pt no symptoms since Feb 2019  . Depression    PTSD  . Diabetes mellitus type 2, diet-controlled ([place])   . Diabetic neuropathy ([place])   . Dyslipidemia   . Dyspnea on exertion   . Erectile dysfunction   . Feeling of incomplete bladder emptying   . GERD (gastroesophageal reflux disease)   . Gout    09-02-2017 last flare up 3 months ago  . Hiatal hernia   . History of atrial fibrillation    episode post op right lung lobectomy 07-04-2015  . History of bladder stone   . History of colon polyps   . History of DVT of lower extremity    03/ 2019  bilateral lower extremity (superficial) ---  per treated w/ oral medication   . History of peptic ulcer disease.   [name]  History of kidney stones   . History of radiation therapy 09-14-2015  to 09-21-2015   left upper lung nodule -- 54Gy in 3 fractions (18 Gy per fraction)  . Hyperplasia of prostate with lower urinary tract symptoms (LUTS)   . Hypertension   . Lumbar spinal stenosis   . Nephrolithiasis   . Neurogenic bladder   . Osteoarthritis    ankle, hands  . Osteoporosis   . Prostate cancer [facility]) urologist-  dr wrenn/  oncologist-  dr Tammi Klippel   dx 05-24-2017-- Stage T2b,  Gleason 4+4,  PSA 22.41,  vol 38cc--- started ADT 04/ 2019,  plan external radiation therapy  . Radiation fibrosis of lung ([place])    hx left upper long nodule SBRT 06/ 2017  . Squamous cell carcinoma of both lungs ([place]) last CT in epic dated 06-26-2017 no recurrence   dx 03/ 2017  non-small cell SCC via bronchoscopy w/ bx's by dr Roxan Hockey---  s/p  right VATS w/ right lobectomy and node dissection's 07-04-2015 /  pt had SBRT to left upper nodule Stage 1 ([facility]) completed 09-21-2015  . Wears dentures    bottom  . Wears glasses     Past Surgical History:  Procedure Laterality Date  . CARPAL TUNNEL RELEASE Right 07/09/2014   Procedure: RIGHT  OPEN CARPAL TUNNEL RELEASE;  Surgeon: Mcarthur Rossetti, MD;  Location: [facility];  Service: Orthopedics;  Laterality: Right;  . CHOLECYSTECTOMY N/A 02/24/2014   Procedure: LAPAROSCOPIC CHOLECYSTECTOMY;  Surgeon: Coralie Keens, MD;  Location: [place];  Service: General;  Laterality: N/A;  . COLONOSCOPY    . CYSTOLITHOTOMY  11/ [facility]      [facility] [place]  . CYSTOSCOPY N/A 04/18/2018   Procedure: CYSTOSCOPY FLEXIBLE;  Surgeon: Irine Seal, MD;  Location: [facility];  Service: Urology;  Laterality: N/A;  . CYSTOSCOPY W/ LITHOLAPAXY / EHL  02-24-2007   dr Janice Norrie  [facility]  . dental implant     No teeth at this time waiting for them to be made as of 01-30-19  . GOLD SEED IMPLANT N/A 09/05/2017   Procedure: GOLD SEED IMPLANT;  Surgeon: Irine Seal, MD;  Location[facility];  Service: Urology;  Laterality: N/A;  . INSERTION OF SUPRAPUBIC CATHETER N/A 04/18/2018   Procedure: SUPRAPUBIC TUBE CHANGE;  Surgeon: Irine Seal, MD;  Location: [facility];  Service: Urology;  Laterality: N/A;  . IR CATHETER TUBE CHANGE  02/24/2018  . SPACE OAR INSTILLATION N/A 09/05/2017   Procedure: SPACE OAR INSTILLATION;  Surgeon: Irine Seal, MD;  Location: [facility];  Service: Urology;  Laterality: N/A;  . TOTAL ANKLE ARTHROPLASTY Right 04/20/2015   Procedure: TOTAL ANKLE ARTHOPLASTY;  Surgeon: Newt Minion, MD;  Location: [place];  Service: Orthopedics;  Laterality: Right;  . TRANSTHORACIC ECHOCARDIOGRAM  11/16/2012   ef 55-60%,  grade 1 diastolic dysfunction/  mild dilated ascending aorta/  mild MR/ trivial TR  . TRANSURETHRAL RESECTION OF PROSTATE N/A 02/03/2019   Procedure: TRANSURETHRAL RESECTION OF THE PROSTATE (TURP);  Surgeon: Irine Seal, MD;  Location: [facility];  Service: Urology;  Laterality: N/A;  . VIDEO ASSISTED THORACOSCOPY (VATS)/ LOBECTOMY Right 07/04/2015   Procedure: VIDEO ASSISTED THORACOSCOPY (VATS)/ RIGHT UPPER LOBECTOMY;  Surgeon: Melrose Nakayama, MD;  Location: [place];  Service: Thoracic;  Laterality: Right;  [name] VIDEO BRONCHOSCOPY N/A 06/17/2015   Procedure: VIDEO BRONCHOSCOPY;  Surgeon: Melrose Nakayama, MD;  Location: [place];  Service: Thoracic;  Laterality: N/A;  . VIDEO BRONCHOSCOPY WITH ENDOBRONCHIAL NAVIGATION N/A 06/17/2015   Procedure: VIDEO BRONCHOSCOPY WITH ENDOBRONCHIAL NAVIGATION;  Surgeon: Melrose Nakayama, MD;  Location: [place];  Service: Thoracic;  Laterality: N/A;  . VIDEO BRONCHOSCOPY WITH ENDOBRONCHIAL ULTRASOUND N/A 06/17/2015   Procedure: VIDEO BRONCHOSCOPY WITH ENDOBRONCHIAL ULTRASOUND;  Surgeon: Melrose Nakayama, MD;  Location: [facility];  Service: Thoracic;  Laterality: N/A;    Family History  Problem Relation Age of Onset  . Cancer Father        Asbestos  . Colon cancer Neg Hx   . Colon polyps Neg Hx   .  Kidney disease Neg Hx   . Esophageal cancer Neg Hx   . Gallbladder disease Neg Hx   . Heart disease Neg Hx   . Diabetes Neg Hx    Social History:  reports that he quit smoking about 4 years ago. His smoking use included cigarettes. He has a 20.00 pack-year smoking history. He has never used smokeless tobacco. He reports that he does not drink alcohol or use drugs.  Allergies:  Allergies  Allergen Reactions  . Ciprofloxacin Other (See Comments)    Doesn't work  . Levaquin [Levofloxacin] Other (See Comments)    Doesn't work    Medications Prior to Admission  Medication Sig Dispense Refill  . atenolol-chlorthalidone (  TENORETIC) 50-25 MG tablet Take 1 tablet by mouth at bedtime.     . diclofenac Sodium (VOLTAREN) 1 % GEL Apply 2 g topically 4 (four) times daily as needed (pain).    [name] escitalopram (LEXAPRO) 5 MG tablet Take 5 mg by mouth daily.    [name] gabapentin (NEURONTIN) 400 MG capsule Take 400 mg by mouth at bedtime.     [name] LANTUS SOLOSTAR 100 UNIT/ML Solostar Pen Inject 2.5 Units into the skin daily.     [name] LINZESS 72 MCG capsule     . metoCLOPramide (REGLAN) 5 MG tablet Take 5 mg by mouth in the morning, at noon, and at bedtime.    . mupirocin cream (BACTROBAN) 2 % Apply 1 application topically 2 (two) times daily as needed (wound care).    [name] oxybutynin (DITROPAN) 5 MG tablet Take 1 tablet (5 mg total) by mouth 3 (three) times daily. 90 tablet 11  . Oxycodone HCl 20 MG TABS Take 5 mg by mouth in the morning, at noon, in the evening, and at bedtime.   0  . potassium chloride (KLOR-CON) 10 MEQ tablet Take 10 mEq by mouth daily.    . predniSONE (DELTASONE) 20 MG tablet Take 20 mg by mouth at bedtime.     . psyllium (METAMUCIL SMOOTH TEXTURE) 58.6 % powder Take 1 packet by mouth daily. (Patient taking differently: Take 1 packet by mouth every other day. )    . sildenafil (VIAGRA) 100 MG tablet Take 100 mg by mouth daily as needed for erectile dysfunction.     . timolol (TIMOPTIC) 0.5 %  ophthalmic solution Place 1 drop into both eyes 2 (two) times daily as needed (eye pressure).    . Incontinence Supply Disposable (FITTED BRIEFS MAXIMUM XL) MISC Use briefs as needed daily for incontinence 1 each 11    Results for orders placed or performed during the hospital encounter of 05/29/19 (from the past 48 hour(s))  Glucose, capillary     Status: Abnormal   Collection Time: 05/29/19  8:15 AM  Result Value Ref Range   Glucose-Capillary 116 (H) 70 - 99 mg/dL    Comment: Glucose reference range applies only to samples taken after fasting for at least 8 hours.   No results found.  Review of Systems  Blood pressure 130/78, pulse 60, temperature 98 F (36.7 C), temperature source Oral, resp. rate (!) 22, height 6\' 8"  (2.032 m), weight 110.2 kg, SpO2 100 %. Physical Exam  Constitutional: He appears well-developed and well-nourished.  HENT:  Mouth/Throat: Oropharynx is clear and moist.  Eyes: Conjunctivae are normal. No scleral icterus.  Neck: No thyromegaly present.  Cardiovascular: Normal rate, regular rhythm and normal heart sounds.  No murmur heard. Respiratory: Effort normal and breath sounds normal.  GI:  Abdomen is symmetrical.  On palpation is soft.  He has mild midepigastric tenderness.  No organomegaly or masses.  Lymphadenopathy:    He has no cervical adenopathy.     Assessment/Plan History of peptic ulcer disease. Postprandial bloating and abdominal pain. Diagnostic esophagogastroduodenoscopy.  Hildred Laser, MD 05/29/2019, 9:23 AM

## 2019-06-01 ENCOUNTER — Other Ambulatory Visit: Payer: Self-pay

## 2019-06-01 LAB — SURGICAL PATHOLOGY

## 2019-06-03 ENCOUNTER — Telehealth: Payer: Self-pay | Admitting: Urology

## 2019-06-03 NOTE — Telephone Encounter (Signed)
Patient needs note stating that his depends are a medical necessity.He needs it sent [place] and it is urgent.

## 2019-06-04 NOTE — Telephone Encounter (Signed)
Okay, I typed a letter and printed to my printer at back. Can you please fax it to [facility] for me?

## 2019-06-05 ENCOUNTER — Other Ambulatory Visit (HOSPITAL_COMMUNITY)
Admission: RE | Admit: 2019-06-05 | Discharge: 2019-06-05 | Disposition: A | Discharge status: Home / Self Care | Payer: Medicare Other | Source: Other Acute Inpatient Hospital | Attending: Urology | Admitting: Urology

## 2019-06-05 ENCOUNTER — Ambulatory Visit (INDEPENDENT_AMBULATORY_CARE_PROVIDER_SITE_OTHER): Payer: Medicare Other

## 2019-06-05 ENCOUNTER — Other Ambulatory Visit: Payer: Self-pay

## 2019-06-05 DIAGNOSIS — R300 Dysuria: Secondary | ICD-10-CM

## 2019-06-05 DIAGNOSIS — N3941 Urge incontinence: Secondary | ICD-10-CM

## 2019-06-05 DIAGNOSIS — N3000 Acute cystitis without hematuria: Secondary | ICD-10-CM

## 2019-06-05 LAB — URINALYSIS, ROUTINE W REFLEX MICROSCOPIC
Bacteria, UA: NONE SEEN
Bilirubin Urine: NEGATIVE
Glucose, UA: NEGATIVE mg/dL
Ketones, ur: NEGATIVE mg/dL
Nitrite: NEGATIVE
Protein, ur: 30 mg/dL — AB
Specific Gravity, Urine: 1.016 (ref 1.005–1.030)
WBC, UA: >50 WBC/hpf — ABNORMAL HIGH (ref 0–5)
pH: 6 (ref 5.0–8.0)

## 2019-06-05 MED ORDER — FITTED BRIEFS MAXIMUM XL MISC: 11 refills | Status: DC

## 2019-06-07 LAB — URINE CULTURE: Culture: 30000 — AB

## 2019-06-25 DIAGNOSIS — H918X9 Other specified hearing loss, unspecified ear: Secondary | ICD-10-CM | POA: Diagnosis not present

## 2019-06-25 DIAGNOSIS — H903 Sensorineural hearing loss, bilateral: Secondary | ICD-10-CM | POA: Diagnosis not present

## 2019-06-25 DIAGNOSIS — R42 Dizziness and giddiness: Secondary | ICD-10-CM | POA: Diagnosis not present

## 2019-06-29 ENCOUNTER — Ambulatory Visit (INDEPENDENT_AMBULATORY_CARE_PROVIDER_SITE_OTHER): Payer: Medicare Other | Admitting: Internal Medicine

## 2019-07-16 DIAGNOSIS — E1143 Type 2 diabetes mellitus with diabetic autonomic (poly)neuropathy: Secondary | ICD-10-CM | POA: Diagnosis not present

## 2019-07-16 DIAGNOSIS — I10 Essential (primary) hypertension: Secondary | ICD-10-CM | POA: Diagnosis not present

## 2019-07-16 DIAGNOSIS — D509 Iron deficiency anemia, unspecified: Secondary | ICD-10-CM | POA: Diagnosis not present

## 2019-07-16 DIAGNOSIS — Z6828 Body mass index (BMI) 28.0-28.9, adult: Secondary | ICD-10-CM | POA: Diagnosis not present

## 2019-07-16 DIAGNOSIS — R42 Dizziness and giddiness: Secondary | ICD-10-CM | POA: Diagnosis not present

## 2019-07-16 DIAGNOSIS — D649 Anemia, unspecified: Secondary | ICD-10-CM | POA: Diagnosis not present

## 2019-07-16 DIAGNOSIS — C61 Malignant neoplasm of prostate: Secondary | ICD-10-CM | POA: Diagnosis not present

## 2019-07-16 DIAGNOSIS — E611 Iron deficiency: Secondary | ICD-10-CM | POA: Diagnosis not present

## 2019-07-16 DIAGNOSIS — F411 Generalized anxiety disorder: Secondary | ICD-10-CM | POA: Diagnosis not present

## 2019-07-22 ENCOUNTER — Encounter (INDEPENDENT_AMBULATORY_CARE_PROVIDER_SITE_OTHER): Payer: Self-pay

## 2019-07-22 DIAGNOSIS — R195 Other fecal abnormalities: Secondary | ICD-10-CM | POA: Insufficient documentation

## 2019-07-22 DIAGNOSIS — D649 Anemia, unspecified: Secondary | ICD-10-CM | POA: Insufficient documentation

## 2019-07-27 DIAGNOSIS — Z6829 Body mass index (BMI) 29.0-29.9, adult: Secondary | ICD-10-CM | POA: Diagnosis not present

## 2019-07-27 DIAGNOSIS — I10 Essential (primary) hypertension: Secondary | ICD-10-CM | POA: Diagnosis not present

## 2019-07-27 DIAGNOSIS — M48061 Spinal stenosis, lumbar region without neurogenic claudication: Secondary | ICD-10-CM | POA: Diagnosis not present

## 2019-08-10 DIAGNOSIS — M48061 Spinal stenosis, lumbar region without neurogenic claudication: Secondary | ICD-10-CM | POA: Diagnosis not present

## 2019-08-10 DIAGNOSIS — M47816 Spondylosis without myelopathy or radiculopathy, lumbar region: Secondary | ICD-10-CM | POA: Diagnosis not present

## 2019-08-17 ENCOUNTER — Ambulatory Visit: Payer: Medicare Other | Admitting: Orthopedic Surgery

## 2019-08-18 DIAGNOSIS — M5126 Other intervertebral disc displacement, lumbar region: Secondary | ICD-10-CM | POA: Diagnosis not present

## 2019-08-18 DIAGNOSIS — Z6831 Body mass index (BMI) 31.0-31.9, adult: Secondary | ICD-10-CM | POA: Diagnosis not present

## 2019-08-18 DIAGNOSIS — M48061 Spinal stenosis, lumbar region without neurogenic claudication: Secondary | ICD-10-CM | POA: Diagnosis not present

## 2019-08-21 ENCOUNTER — Encounter: Payer: Self-pay | Admitting: Urology

## 2019-08-21 ENCOUNTER — Ambulatory Visit (INDEPENDENT_AMBULATORY_CARE_PROVIDER_SITE_OTHER): Payer: Medicare Other | Admitting: Urology

## 2019-08-21 ENCOUNTER — Other Ambulatory Visit: Payer: Self-pay | Admitting: Urology

## 2019-08-21 ENCOUNTER — Other Ambulatory Visit: Payer: Self-pay

## 2019-08-21 ENCOUNTER — Other Ambulatory Visit (HOSPITAL_COMMUNITY)
Admission: RE | Admit: 2019-08-21 | Discharge: 2019-08-21 | Disposition: A | Discharge status: Home / Self Care | Payer: Medicare Other | Source: Other Acute Inpatient Hospital | Attending: Urology | Admitting: Urology

## 2019-08-21 VITALS — BP 125/82 | HR 66 | Temp 97.0°F | Ht >= 80 in | Wt 253.0 lb

## 2019-08-21 DIAGNOSIS — N3941 Urge incontinence: Secondary | ICD-10-CM | POA: Diagnosis not present

## 2019-08-21 DIAGNOSIS — Z8546 Personal history of malignant neoplasm of prostate: Secondary | ICD-10-CM | POA: Diagnosis not present

## 2019-08-21 DIAGNOSIS — N5201 Erectile dysfunction due to arterial insufficiency: Secondary | ICD-10-CM

## 2019-08-21 DIAGNOSIS — R3129 Other microscopic hematuria: Secondary | ICD-10-CM | POA: Diagnosis not present

## 2019-08-21 DIAGNOSIS — Z87440 Personal history of urinary (tract) infections: Secondary | ICD-10-CM

## 2019-08-21 LAB — URINALYSIS, ROUTINE W REFLEX MICROSCOPIC
Bilirubin Urine: NEGATIVE
Glucose, UA: NEGATIVE mg/dL
Ketones, ur: NEGATIVE mg/dL
Nitrite: NEGATIVE
Protein, ur: NEGATIVE mg/dL
Specific Gravity, Urine: 1.016 (ref 1.005–1.030)
pH: 6 (ref 5.0–8.0)

## 2019-08-21 LAB — POCT URINALYSIS DIPSTICK
Bilirubin, UA: NEGATIVE
Glucose, UA: NEGATIVE
Ketones, UA: NEGATIVE
Nitrite, UA: NEGATIVE
Protein, UA: POSITIVE — AB
Spec Grav, UA: 1.015 (ref 1.010–1.025)
Urobilinogen, UA: 0.2 E.U./dL
pH, UA: 6 (ref 5.0–8.0)

## 2019-08-21 MED ORDER — OXYBUTYNIN CHLORIDE 5 MG PO TABS: 5.0000 mg | ORAL_TABLET | Freq: Three times a day (TID) | ORAL | 3 refills | Status: DC

## 2019-08-21 NOTE — Progress Notes (Signed)
Subjective:  1. History of prostate cancer   2. Urge incontinence   3. Microhematuria   4. Erectile dysfunction due to arterial insufficiency   5. Personal history of urinary infection     Jonathan [name] returns today in f/u. He has a history of urinary retention following radiation therapy and required prolonged use of an SP tube followed by a TURP on 02/03/19.  He is doing better with no further pain or spasms.  He has persistent frequency and urgency with UUI but is 3 bed pads a day and 2 depends at a time and changes 5-6 x daily.  He remains on oxybutynin tid.   His nocturia is stable at 4x.   He has a good stream and he feels he empties well.  He has no hematuria but has some dysuria. He continues to have malodorous urine.    His IPSS is 27/4.      He has a history of T3b N0 M0 Gleason 9 prostate cancer that was treated with EXRT and ADT.   He completed EXRT in 9/19.   He is overdue for a Lupron injection which was last given in 5/20.  His PSA in January was <0.1 and his testosterone is 14.      He has been using some Sildenafil for ED with some benefit.     ROS:  ROS:  A complete review of systems was performed.  All systems are negative except for pertinent findings as noted.   Review of Systems  Genitourinary: Positive for dysuria, frequency and urgency.  Musculoskeletal: Positive for back pain.  All other systems reviewed and are negative.   Allergies  Allergen Reactions  . Ciprofloxacin Other (See Comments)    Doesn't work  . Levaquin [Levofloxacin] Other (See Comments)    Doesn't work    Outpatient Encounter Medications as of 08/21/2019  Medication Sig  . atenolol-chlorthalidone (TENORETIC) 50-25 MG tablet Take 1 tablet by mouth at bedtime.   . diclofenac Sodium (VOLTAREN) 1 % GEL Apply 2 g topically 4 (four) times daily as needed (pain).  [name] escitalopram (LEXAPRO) 5 MG tablet Take 5 mg by mouth daily.  [name] gabapentin (NEURONTIN) 400 MG capsule Take 400 mg by mouth at  bedtime.   [name] ibuprofen (ADVIL) 600 MG tablet   . Incontinence Supply Disposable (FITTED BRIEFS MAXIMUM XL) MISC Use briefs as needed daily for incontinence  . LANTUS SOLOSTAR 100 UNIT/ML Solostar Pen Inject 2.5 Units into the skin daily.   [name] LINZESS 72 MCG capsule   . metFORMIN (GLUCOPHAGE) 500 MG tablet metformin 500 mg tablet  . metoCLOPramide (REGLAN) 5 MG tablet Take 5 mg by mouth in the morning, at noon, and at bedtime.  . mupirocin cream (BACTROBAN) 2 % Apply 1 application topically 2 (two) times daily as needed (wound care).  [name] oxybutynin (DITROPAN) 5 MG tablet Take 1 tablet (5 mg total) by mouth 3 (three) times daily.  . Oxycodone HCl 20 MG TABS Take 5 mg by mouth in the morning, at noon, in the evening, and at bedtime.   . pantoprazole (PROTONIX) 40 MG tablet Take 1 tablet (40 mg total) by mouth 2 (two) times daily before a meal.  . potassium chloride (KLOR-CON) 10 MEQ tablet Take 10 mEq by mouth daily.  . predniSONE (DELTASONE) 20 MG tablet Take 20 mg by mouth at bedtime.   . psyllium (METAMUCIL SMOOTH TEXTURE) 58.6 % powder Take 1 packet by mouth daily. (Patient taking differently: Take 1 packet by mouth every  other day. )  . sildenafil (VIAGRA) 100 MG tablet Take 100 mg by mouth daily as needed for erectile dysfunction.   . timolol (TIMOPTIC) 0.5 % ophthalmic solution Place 1 drop into both eyes 2 (two) times daily as needed (eye pressure).  . [DISCONTINUED] oxybutynin (DITROPAN) 5 MG tablet Take 1 tablet (5 mg total) by mouth 3 (three) times daily.   No facility-administered encounter medications on file as of 08/21/2019.    Past Medical History:  Diagnosis Date  . Acquired bladder diverticulum    12/ 2012  resection diverticulum done at [facility] in [place], [place]  . Age-related cataract of right eye    scheduled for catarat extraction 09-12-2017  . Agent orange exposure   . Aneurysm of infrarenal abdominal aorta ([place])    first dx 2017--- last CT 06-11-2017  measures 3.5cm  .  Chronic constipation   . COPD with emphysema ([place])    06--12-2017  per pt no symptoms since Feb 2019  . Depression    PTSD  . Diabetes mellitus type 2, diet-controlled ([place])   . Diabetic neuropathy ([place])   . Dyslipidemia   . Dyspnea on exertion   . Erectile dysfunction   . Feeling of incomplete bladder emptying   . GERD (gastroesophageal reflux disease)   . Gout    09-02-2017 last flare up 3 months ago  . Hiatal hernia   . History of atrial fibrillation    episode post op right lung lobectomy 07-04-2015  . History of bladder stone   . History of colon polyps   . History of DVT of lower extremity    03/ 2019  bilateral lower extremity (superficial) ---  per treated w/ oral medication   . History of gastritis   . History of kidney stones   . History of radiation therapy 09-14-2015  to 09-21-2015   left upper lung nodule -- 54Gy in 3 fractions (18 Gy per fraction)  . Hyperplasia of prostate with lower urinary tract symptoms (LUTS)   . Hypertension   . Lumbar spinal stenosis   . Nephrolithiasis   . Neurogenic bladder   . Osteoarthritis    ankle, hands  . Osteoporosis   . Prostate cancer [facility]) urologist-  dr Offie Pickron/  oncologist-  dr Tammi Klippel   dx 05-24-2017-- Stage T2b,  Gleason 4+4,  PSA 22.41,  vol 38cc--- started ADT 04/ 2019,  plan external radiation therapy  . Radiation fibrosis of lung ([place])    hx left upper long nodule SBRT 06/ 2017  . Squamous cell carcinoma of both lungs ([place]) last CT in epic dated 06-26-2017 no recurrence   dx 03/ 2017  non-small cell SCC via bronchoscopy w/ bx's by dr Roxan Hockey---  s/p  right VATS w/ right lobectomy and node dissection's 07-04-2015 /  pt had SBRT to left upper nodule Stage 1 ([facility]) completed 09-21-2015  . Wears dentures    bottom  . Wears glasses     Past Surgical History:  Procedure Laterality Date  . BIOPSY  05/29/2019   Procedure: BIOPSY;  Surgeon: Rogene Houston, MD;  [facility];  Service:  Endoscopy;;  gastric ulcer  . CARPAL TUNNEL RELEASE Right 07/09/2014   Procedure: RIGHT OPEN CARPAL TUNNEL RELEASE;  Surgeon: Mcarthur Rossetti, MD;  [facility];  Service: Orthopedics;  Laterality: Right;  . CHOLECYSTECTOMY N/A 02/24/2014   Procedure: LAPAROSCOPIC CHOLECYSTECTOMY;  Surgeon: Coralie Keens, MD;  Location: [place];  Service: General;  Laterality: N/A;  .  COLONOSCOPY    . CYSTOLITHOT[facility]Y  [facility] [place]  . CYSTOSCOPY N/A 04/18/2018   Procedure: CYSTOSCOPY FLEXIBLE;  Surgeon: Irine Seal, MD;  Location: [facility];  Service: Urology;  Laterality: N/A;  . CYSTOSCOPY W/ LITHOLAPAXY / EHL  02-24-2007   dr Janice Norrie  [facility]  . dental implant     No teeth at this time waiting for them to be made as of 01-30-19  . ESOPHAGOGASTRODUODENOSCOPY (EGD) WITH PROPOFOL N/A 05/29/2019   Procedure: ESOPHAGOGASTRODUODENOSCOPY (EGD) WITH PROPOFOL;  Surgeon: Rogene Houston, MD;  [facility];  Service: Endoscopy;  Laterality: N/A;  925  . GOLD SEED IMPLANT N/A 09/05/2017   Procedure: GOLD SEED IMPLANT;  Surgeon: Irine Seal, MD;  Location: [facility];  Service: Urology;  Laterality: N/A;  . INSERTION OF SUPRAPUBIC CATHETER N/A 04/18/2018   Procedure: SUPRAPUBIC TUBE CHANGE;  Surgeon: Irine Seal, MD;  [facility];  Service: Urology;  Laterality: N/A;  . IR CATHETER TUBE CHANGE  02/24/2018  . SPACE OAR INSTILLATION N/A 09/05/2017   Procedure: SPACE OAR INSTILLATION;  Surgeon: Irine Seal, MD;  Location: [facility] [place];  Service: Urology;  Laterality: N/A;  . TOTAL ANKLE ARTHROPLASTY Right 04/20/2015   Procedure: TOTAL ANKLE ARTHOPLASTY;  Surgeon: Newt Minion, MD;  Location: [place];  Service: Orthopedics;  Laterality: Right;  . TRANSTHORACIC ECHOCARDIOGRAM  11/16/2012   ef 55-60%,  grade 1 diastolic dysfunction/  mild dilated ascending aorta/  mild MR/ trivial TR  . TRANSURETHRAL RESECTION OF PROSTATE N/A 02/03/2019   Procedure:  TRANSURETHRAL RESECTION OF THE PROSTATE (TURP);  Surgeon: Irine Seal, MD;  [facility];  Service: Urology;  Laterality: N/A;  . VIDEO ASSISTED THORACOSCOPY (VATS)/ LOBECTOMY Right 07/04/2015   Procedure: VIDEO ASSISTED THORACOSCOPY (VATS)/ RIGHT UPPER LOBECTOMY;  Surgeon: Melrose Nakayama, MD;  Location: [place];  Service: Thoracic;  Laterality: Right;  [name] VIDEO BRONCHOSCOPY N/A 06/17/2015   Procedure: VIDEO BRONCHOSCOPY;  Surgeon: Melrose Nakayama, MD;  Location: [place];  Service: Thoracic;  Laterality: N/A;  . VIDEO BRONCHOSCOPY WITH ENDOBRONCHIAL NAVIGATION N/A 06/17/2015   Procedure: VIDEO BRONCHOSCOPY WITH ENDOBRONCHIAL NAVIGATION;  Surgeon: Melrose Nakayama, MD;  Location: [place];  Service: Thoracic;  Laterality: N/A;  . VIDEO BRONCHOSCOPY WITH ENDOBRONCHIAL ULTRASOUND N/A 06/17/2015   Procedure: VIDEO BRONCHOSCOPY WITH ENDOBRONCHIAL ULTRASOUND;  Surgeon: Melrose Nakayama, MD;  Location: [place];  Service: Thoracic;  Laterality: N/A;    Social History   Socioeconomic History  . Marital status: Widowed    Spouse name: Not on file  . Number of children: 2  . Years of education: college  . Highest education level: Not on file  Occupational History  . Occupation: retired  Tobacco Use  . Smoking status: Former Smoker    Packs/day: 0.50    Years: 40.00    Pack years: 20.00    Types: Cigarettes    Quit date: 02/13/2015    Years since quitting: 4.5  . Smokeless tobacco: Never Used  Substance and Sexual Activity  . Alcohol use: No    Alcohol/week: 0.0 standard drinks  . Drug use: No  . Sexual activity: Not Currently  Other Topics Concern  . Not on file  Social History Narrative  . Not on file   Social Determinants of Health   Financial Resource Strain:   . Difficulty of Paying Living Expenses:   Food Insecurity:   . Worried About [employer] of  Food in the Last Year:   . [place] in the Last Year:   Transportation Needs:   . Film/video editor  (Medical):   [name] Lack of Transportation (Non-Medical):   Physical Activity:   . Days of Exercise per Week:   . Minutes of Exercise per Session:   Stress:   . Feeling of Stress :   Social Connections:   . Frequency of Communication with Friends and Family:   . Frequency of Social Gatherings with Friends and Family:   . Attends Religious Services:   . Active Member of Clubs or Organizations:   . Attends Archivist Meetings:   [name] Marital Status:   Intimate Partner Violence:   . Fear of Current or Ex-Partner:   . Emotionally Abused:   [name] Physically Abused:   . Sexually Abused:     Family History  Problem Relation Age of Onset  . Cancer Father        Asbestos  . Colon cancer Neg Hx   . Colon polyps Neg Hx   . Kidney disease Neg Hx   . Esophageal cancer Neg Hx   . Gallbladder disease Neg Hx   . Heart disease Neg Hx   . Diabetes Neg Hx        Objective: Vitals:   08/21/19 1130  BP: 125/82  Pulse: 66  Temp: (!) 97 F (36.1 C)     Physical Exam Vitals reviewed.  Constitutional:      Appearance: Normal appearance.  Neurological:     Mental Status: He is alert.     Lab Results:  Results for orders placed or performed in visit on 08/21/19 (from the past 24 hour(s))  POCT urinalysis dipstick     Status: Abnormal   Collection Time: 08/21/19 11:43 AM  Result Value Ref Range   Color, UA yellow    Clarity, UA     Glucose, UA Negative Negative   Bilirubin, UA neg    Ketones, UA neg    Spec Grav, UA 1.015 1.010 - 1.025   Blood, UA ++    pH, UA 6.0 5.0 - 8.0   Protein, UA Positive (A) Negative   Urobilinogen, UA 0.2 0.2 or 1.0 E.U./dL   Nitrite, UA neg    Leukocytes, UA Small (1+) (A) Negative   Appearance clear    Odor      BMET No results for input(s): NA, K, CL, CO2, GLUCOSE, BUN, CREATININE, CALCIUM in the last 72 hours. PSA PSA  Date Value Ref Range Status  04/06/2019 <0.1 < OR = 4.0 ng/mL Final    Comment:    The total PSA value from this  assay system is  standardized against the WHO standard. The test  result will be approximately 20% lower when compared  to the equimolar-standardized total PSA (Beckman  Coulter). Comparison of serial PSA results should be  interpreted with this fact in mind. . This test was performed using the Siemens  chemiluminescent method. Values obtained from  different assay methods cannot be used interchangeably. PSA levels, regardless of value, should not be interpreted as absolute evidence of the presence or absence of disease.   10/26/2008 0.69 0.10 - 4.00 ng/mL Final    Comment:    See lab report for associated comment(s)  09/12/2007 0.74 0.10 - 4.00 ng/mL Final    Comment:    See lab report for associated comment(s)   Testosterone  Date Value Ref Range Status  04/06/2019 14 (  L) 250 - 827 ng/dL Final    Comment:    In hypogonadal males, Testosterone, Total, LC/MS/MS, is the recommended assay due to the diminished accuracy of immunoassay at levels below 250 ng/dL. This test code [id number]) must be collected in a red-top tube with no gel.    05/20/2017 255 (L) 264 - 916 ng/dL Final    Comment:    (NOTE) Adult male reference interval is based on a population of healthy nonobese males (BMI <30) between 53 and 94 years old. [place], [place] [phone]. PMID: [id number]. Performed At: [facility] [place], [place] [id number] Rush Farmer MD [id number] Performed at [facility], [address] [place], [place] [address]       Studies/Results: No results found.    Assessment & Plan: Prostate cancer.   He got his last Lupron in 1/21.  I will get labs today and at f/u in 3 months.    OAB with incontinence.  This has worsened.  His symptoms are gradually improving.  He remains on Oxybutynin.    ED.  He has not been using the sildenafil recently.   Meds ordered this encounter  Medications  . oxybutynin (DITROPAN) 5 MG tablet    Sig: Take  1 tablet (5 mg total) by mouth 3 (three) times daily.    Dispense:  270 tablet    Refill:  3     Orders Placed This Encounter  Procedures  . Urine Culture    Standing Status:   Future    Standing Expiration Date:   08/20/2020  . Urinalysis, Routine w reflex microscopic    Standing Status:   Future    Standing Expiration Date:   08/20/2020  . PSA    Standing Status:   Future    Standing Expiration Date:   08/20/2020  . Testosterone    Standing Status:   Future    Standing Expiration Date:   08/20/2020  . PSA    Standing Status:   Future    Standing Expiration Date:   08/20/2020  . Testosterone    Standing Status:   Future    Standing Expiration Date:   08/20/2020  . POCT urinalysis dipstick      Return in about 3 months (around 11/21/2019) for With PSA and testosterone. .   CC: [name], PA-C      Irine Seal 08/21/2019

## 2019-08-22 LAB — PSA: PSA: <0.1 ng/mL (ref <=4.0)

## 2019-08-22 LAB — URINE CULTURE: Culture: <10000 — AB

## 2019-08-22 LAB — TESTOSTERONE: Testosterone: 15 ng/dL — ABNORMAL LOW (ref 250–827)

## 2019-08-27 DIAGNOSIS — M5126 Other intervertebral disc displacement, lumbar region: Secondary | ICD-10-CM | POA: Diagnosis not present

## 2019-08-27 DIAGNOSIS — M48061 Spinal stenosis, lumbar region without neurogenic claudication: Secondary | ICD-10-CM | POA: Diagnosis not present

## 2019-08-27 DIAGNOSIS — I10 Essential (primary) hypertension: Secondary | ICD-10-CM | POA: Diagnosis not present

## 2019-08-27 DIAGNOSIS — Z6830 Body mass index (BMI) 30.0-30.9, adult: Secondary | ICD-10-CM | POA: Diagnosis not present

## 2019-08-31 ENCOUNTER — Ambulatory Visit (INDEPENDENT_AMBULATORY_CARE_PROVIDER_SITE_OTHER): Payer: Medicare Other

## 2019-08-31 ENCOUNTER — Ambulatory Visit (INDEPENDENT_AMBULATORY_CARE_PROVIDER_SITE_OTHER): Payer: Medicare Other | Admitting: Orthopedic Surgery

## 2019-08-31 ENCOUNTER — Encounter: Payer: Self-pay | Admitting: Orthopedic Surgery

## 2019-08-31 VITALS — Ht >= 80 in | Wt 253.0 lb

## 2019-08-31 DIAGNOSIS — M25571 Pain in right ankle and joints of right foot: Secondary | ICD-10-CM

## 2019-08-31 DIAGNOSIS — R29898 Other symptoms and signs involving the musculoskeletal system: Secondary | ICD-10-CM | POA: Diagnosis not present

## 2019-08-31 NOTE — Progress Notes (Signed)
Office Visit Note   Patient: Jonathan [name]           Date of Birth: 1940/09/28           MRN: [id number] Visit Date: 08/31/2019              Requested by: [name], PA-C [place],  [place] [id number] PCP: [name], PA-C  Chief Complaint  Patient presents with  . Right Ankle - Pain    Hx total ankle replacement       HPI: Patient is a 79 year old gentleman who is status post right total ankle arthroplasty.  Patient complains of ligamentous laxity in both ankles he is used a leather brace which he states rubbed on the fibula and was painful.  Assessment & Plan: Visit Diagnoses:  1. Pain in right ankle and joints of right foot   2. Ankle weakness     Plan: Patient is given a prescription to get a pair of air stirrup braces this should help provide support for both ankles without impingement on the hardware.  Follow-Up Instructions: No follow-ups on file.   Ortho Exam  Patient is alert, oriented, no adenopathy, well-dressed, normal affect, normal respiratory effort. Examination patient has good pain-free range of motion of the right ankle the joint is congruent.  He has no pain with weightbearing.  Left ankle patient does have some laxity of the ankle ligaments no pain with passive range of motion.  Imaging: XR Ankle Complete Right  Result Date: 08/31/2019 Three-view radiographs of the right ankle shows a stable total ankle arthroplasty no lucency around the implants no wear of the polyethylene no failure of the fibular osteotomy.  No images are attached to the encounter.  Labs: Lab Results  Component Value Date   HGBA1C 5.9 (H) 01/30/2019   HGBA1C 6.3 (H) 06/13/2015   HGBA1C 6.6 (H) 04/18/2015   REPTSTATUS 08/22/2019 FINAL 08/21/2019   GRAMSTAIN  06/17/2015    NO WBC SEEN NO SQUAMOUS EPITHELIAL CELLS SEEN NO ORGANISMS SEEN Performed at [facility] (A) 08/21/2019    <10,000 COLONIES/mL INSIGNIFICANT GROWTH Performed at [facility] [address] [place], [place] [address]    LABORGA STAPHYLOCOCCUS HAEMOLYTICUS (A) 06/05/2019     Lab Results  Component Value Date   ALBUMIN 3.4 (L) 12/21/2017   ALBUMIN 3.8 12/10/2017   ALBUMIN 3.6 10/25/2016    No results found for: MG No results found for: VD25OH  No results found for: PREALBUMIN CBC EXTENDED Latest Ref Rng & Units 01/30/2019 08/02/2018 07/26/2018  WBC 4.0 - 10.5 K/uL 5.0 4.7 6.6  RBC 4.22 - 5.81 MIL/uL 4.02(L) 3.78(L) 3.59(L)  HGB 13.0 - 17.0 g/dL 11.9(L) 11.7(L) 11.2(L)  HCT 39.0 - 52.0 % 37.4(L) 35.3(L) 33.4(L)  PLT 150 - 400 K/uL 181 165 157  NEUTROABS 1.7 - 7.7 K/uL - 2.9 4.5  LYMPHSABS 0.7 - 4.0 K/uL - 1.3 1.5     Body mass index is 27.79 kg/m.  Orders:  Orders Placed This Encounter  Procedures  . XR Ankle Complete Right   No orders of the defined types were placed in this encounter.    Procedures: No procedures performed  Clinical Data: No additional findings.  ROS:  All other systems negative, except as noted in the HPI. Review of Systems  Objective: Vital Signs: Ht 6\' 8"  (2.032 m)   Wt 253 lb (114.8 kg)   BMI 27.79 kg/m   Specialty Comments:  No specialty comments available.  PMFS History: Patient Active Problem List   Diagnosis Date Noted  . History of peptic ulcer disease   . History of prostate cancer 04/10/2019  . Urge incontinence 04/10/2019  . Urinary retention 04/18/2018  . Malignant neoplasm of prostate ([place]) 07/10/2017  . Arthritis of right subtalar joint 04/18/2017  . 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 Idiopathic chronic venous hypertension of both lower extremities with inflammation 05/22/2016  . Fibrosis of subtalar joint, right 03/08/2016  . Primary malignant neoplasm of left upper lobe of lung ([place]) 09/21/2015  . Lung cancer ([place]) 07/04/2015  . Diabetes mellitus type II, controlled ([place]) 06/01/2015  . Neoplasm of uncertain behavior of right upper lobe of lung  05/31/2015  . 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 Emphysema of lung ([place])   . Kidney stones   . Osteoporosis   . H/O total ankle replacement, right 04/20/2015  . Carpal tunnel syndrome - right 07/09/2014  . Bloating 06/14/2014  . History of colonic polyps 06/14/2014  . Diabetic polyneuropathy associated with type 2 diabetes mellitus ([place]) 12/05/2012  . Lesion of ulnar nerve 12/05/2012  . Syncope 11/15/2012  . Gout 11/15/2012  . Chronic constipation 04/02/2009  . CYSTITIS, RECURRENT 12/02/2008  . COPD 10/26/2008  . DIVERTICULUM, BLADDER 10/11/2008  . INSOMNIA 07/24/2008  . Acute cystitis without hematuria 07/20/2008  . FATIGUE 07/20/2008  . DEPRESSION 02/27/2008  . CHEST PAIN UNSPECIFIED 02/27/2008  . NICOTINE ADDICTION 08/12/2007  . DYSLIPIDEMIA 07/22/2007  . OBESITY 07/22/2007  . ERECTILE DYSFUNCTION 07/22/2007  . HYPERTENSION 07/22/2007   Past Medical History:  Diagnosis Date  . Acquired bladder diverticulum    12/ 2012  resection diverticulum done at [facility] in [place], [place]  . Age-related cataract of right eye    scheduled for catarat extraction 09-12-2017  . Agent orange exposure   . Aneurysm of infrarenal abdominal aorta ([place])    first dx 2017--- last CT 06-11-2017  measures 3.5cm  . Chronic constipation   . COPD with emphysema ([place])    06--12-2017  per pt no symptoms since Feb 2019  . Depression    PTSD  . Diabetes mellitus type 2, diet-controlled ([place])   . Diabetic neuropathy ([place])   . Dyslipidemia   . Dyspnea on exertion   . Erectile dysfunction   . Feeling of incomplete bladder emptying   . GERD (gastroesophageal reflux disease)   . Gout    09-02-2017 last flare up 3 months ago  . Hiatal hernia   . History of atrial fibrillation    episode post op right lung lobectomy 07-04-2015  . History of bladder stone   . History of colon polyps   . History of DVT of lower extremity    03/ 2019  bilateral lower extremity  (superficial) ---  per treated w/ oral medication   . History of gastritis   . History of kidney stones   . History of radiation therapy 09-14-2015  to 09-21-2015   left upper lung nodule -- 54Gy in 3 fractions (18 Gy per fraction)  . Hyperplasia of prostate with lower urinary tract symptoms (LUTS)   . Hypertension   . Lumbar spinal stenosis   . Nephrolithiasis   . Neurogenic bladder   . Osteoarthritis    ankle, hands  . Osteoporosis   . Prostate cancer [facility]) urologist-  dr wrenn/  oncologist-  dr Tammi Klippel   dx 05-24-2017-- Stage T2b,  Gleason 4+4,  PSA 22.41,  vol 38cc--- started ADT 04/ 2019,  plan external radiation therapy  . Radiation fibrosis of lung ([place])    hx left upper long nodule SBRT 06/ 2017  . Squamous cell carcinoma of both lungs ([place]) last CT in epic dated 06-26-2017 no recurrence   dx 03/ 2017  non-small cell SCC via bronchoscopy w/ bx's by dr Roxan Hockey---  s/p  right VATS w/ right lobectomy and node dissection's 07-04-2015 /  pt had SBRT to left upper nodule Stage 1 ([facility]) completed 09-21-2015  . Wears dentures    bottom  . Wears glasses     Family History  Problem Relation Age of Onset  . Cancer Father        Asbestos  . Colon cancer Neg Hx   . Colon polyps Neg Hx   . Kidney disease Neg Hx   . Esophageal cancer Neg Hx   . Gallbladder disease Neg Hx   . Heart disease Neg Hx   . Diabetes Neg Hx     Past Surgical History:  Procedure Laterality Date  . BIOPSY  05/29/2019   Procedure: BIOPSY;  Surgeon: Rogene Houston, MD;  [facility];  Service: Endoscopy;;  gastric ulcer  . CARPAL TUNNEL RELEASE Right 07/09/2014   Procedure: RIGHT OPEN CARPAL TUNNEL RELEASE;  Surgeon: Mcarthur Rossetti, MD;  [facility]: [facility];  Service: Orthopedics;  Laterality: Right;  . CHOLECYSTECTOMY N/A 02/24/2014   Procedure: LAPAROSCOPIC CHOLECYSTECTOMY;  Surgeon: Coralie Keens, MD;  Location: [facility];  Service: General;  Laterality:  N/A;  . COLONOSCOPY    . CYSTOLITHOT[facility]Y  [facility]/ [facility] [place]  . CYSTOSCOPY N/A 04/18/2018   Procedure: CYSTOSCOPY FLEXIBLE;  Surgeon: Irine Seal, MD;  Location: [facility];  Service: Urology;  Laterality: N/A;  . CYSTOSCOPY W/ LITHOLAPAXY / EHL  02-24-2007   dr Janice Norrie  [facility]  . dental implant     No teeth at this time waiting for them to be made as of 01-30-19  . ESOPHAGOGASTRODUODENOSCOPY (EGD) WITH PROPOFOL N/A 05/29/2019   Procedure: ESOPHAGOGASTRODUODENOSCOPY (EGD) WITH PROPOFOL;  Surgeon: Rogene Houston, MD;  [facility];  Service: Endoscopy;  Laterality: N/A;  925  . GOLD SEED IMPLANT N/A 09/05/2017   Procedure: GOLD SEED IMPLANT;  Surgeon: Irine Seal, MD;  Location: [facility];  Service: Urology;  Laterality: N/A;  . INSERTION OF SUPRAPUBIC CATHETER N/A 04/18/2018   Procedure: SUPRAPUBIC TUBE CHANGE;  Surgeon: Irine Seal, MD;  [facility];  Service: Urology;  Laterality: N/A;  . IR CATHETER TUBE CHANGE  02/24/2018  . SPACE OAR INSTILLATION N/A 09/05/2017   Procedure: SPACE OAR INSTILLATION;  Surgeon: Irine Seal, MD;  Location: [facility];  Service: Urology;  Laterality: N/A;  . TOTAL ANKLE ARTHROPLASTY Right 04/20/2015   Procedure: TOTAL ANKLE ARTHOPLASTY;  Surgeon: Newt Minion, MD;  Location: [facility];  Service: Orthopedics;  Laterality: Right;  . TRANSTHORACIC ECHOCARDIOGRAM  11/16/2012   ef 55-60%,  grade 1 diastolic dysfunction/  mild dilated ascending aorta/  mild MR/ trivial TR  . TRANSURETHRAL RESECTION OF PROSTATE N/A 02/03/2019   Procedure: TRANSURETHRAL RESECTION OF THE PROSTATE (TURP);  Surgeon: Irine Seal, MD;  [facility];  Service: Urology;  Laterality: N/A;  . VIDEO ASSISTED THORACOSCOPY (VATS)/ LOBECTOMY Right 07/04/2015   Procedure: VIDEO ASSISTED THORACOSCOPY (VATS)/ RIGHT UPPER LOBECTOMY;  Surgeon: Melrose Nakayama, MD;  [facility];  Service: Thoracic;  Laterality: Right;  [name] VIDEO BRONCHOSCOPY N/A  06/17/2015   Procedure: VIDEO BRONCHOSCOPY;  Surgeon: Melrose Nakayama, MD;  Location: [facility];  Service: Thoracic;  Laterality: N/A;  . VIDEO BRONCHOSCOPY WITH ENDOBRONCHIAL NAVIGATION N/A 06/17/2015   Procedure: VIDEO BRONCHOSCOPY WITH ENDOBRONCHIAL NAVIGATION;  Surgeon: Melrose Nakayama, MD;  Location: [facility];  Service: Thoracic;  Laterality: N/A;  . VIDEO BRONCHOSCOPY WITH ENDOBRONCHIAL ULTRASOUND N/A 06/17/2015   Procedure: VIDEO BRONCHOSCOPY WITH ENDOBRONCHIAL ULTRASOUND;  Surgeon: Melrose Nakayama, MD;  Location: [facility];  Service: Thoracic;  Laterality: N/A;   Social History   Occupational History  . Occupation: retired  Tobacco Use  . Smoking status: Former Smoker    Packs/day: 0.50    Years: 40.00    Pack years: 20.00    Types: Cigarettes    Quit date: 02/13/2015    Years since quitting: 4.5  . Smokeless tobacco: Never Used  Substance and Sexual Activity  . Alcohol use: No    Alcohol/week: 0.0 standard drinks  . Drug use: No  . Sexual activity: Not Currently

## 2019-09-03 ENCOUNTER — Telehealth: Payer: Self-pay | Admitting: Urology

## 2019-09-03 DIAGNOSIS — E1140 Type 2 diabetes mellitus with diabetic neuropathy, unspecified: Secondary | ICD-10-CM | POA: Diagnosis not present

## 2019-09-03 DIAGNOSIS — M79671 Pain in right foot: Secondary | ICD-10-CM | POA: Diagnosis not present

## 2019-09-03 DIAGNOSIS — B353 Tinea pedis: Secondary | ICD-10-CM | POA: Diagnosis not present

## 2019-09-03 DIAGNOSIS — M79672 Pain in left foot: Secondary | ICD-10-CM | POA: Diagnosis not present

## 2019-09-03 DIAGNOSIS — R350 Frequency of micturition: Secondary | ICD-10-CM | POA: Diagnosis not present

## 2019-09-03 DIAGNOSIS — C61 Malignant neoplasm of prostate: Secondary | ICD-10-CM | POA: Diagnosis not present

## 2019-09-03 DIAGNOSIS — I739 Peripheral vascular disease, unspecified: Secondary | ICD-10-CM | POA: Diagnosis not present

## 2019-09-03 DIAGNOSIS — R32 Unspecified urinary incontinence: Secondary | ICD-10-CM | POA: Diagnosis not present

## 2019-09-03 DIAGNOSIS — Z6828 Body mass index (BMI) 28.0-28.9, adult: Secondary | ICD-10-CM | POA: Diagnosis not present

## 2019-09-03 NOTE — Telephone Encounter (Signed)
Pt called c/o urinary frequency. I informed him that we didn't have a Dr or nurse here today. He asked for a nurse to call him back on Friday 09/04/19. He states he is seeing his PCP today.

## 2019-09-04 NOTE — Telephone Encounter (Signed)
I spoke with pt and he needs a rx sent to the [place] [facility] for his Depends with reason for them. I assume he means last office note needs to be sent with it. Also he wants to talk with Dr. Jeffie Pollock about his urinary issues. He said he could not wait 3 months. Pt was somewhere around other people and was trying to talk with them and I at the same time. He didn't want to explain symptoms in front of these other people. I explained someone would call him back Monday after we work on the rx and then talk about his symptoms. Pt was ok with this.

## 2019-09-09 DIAGNOSIS — M48061 Spinal stenosis, lumbar region without neurogenic claudication: Secondary | ICD-10-CM | POA: Diagnosis not present

## 2019-09-09 NOTE — Telephone Encounter (Signed)
Called pt. Talked to him about his symptoms. I explained I would send task to Dr. Jeffie Pollock about them. I also will fax rx to [place] in [place] for his depends. Pt was upset it was taking so long to get back with.

## 2019-09-09 NOTE — Telephone Encounter (Signed)
Called pt and gave him an appt for 6/18 with Dr. Jeffie Pollock. Faxed rx for briefs to [place].

## 2019-09-10 NOTE — Progress Notes (Signed)
Subjective:  1. History of prostate cancer   2. Urinary frequency   3. Stress incontinence, male   4. Candida infection of genital region     Jonathan [name] returns today in f/u. He has a history of urinary retention following radiation therapy and required prolonged use of an SP tube followed by a TURP on 02/03/19.  He is having persistent daytime frequency but doesn't wake at night.  He has marked SUI and has to wear 2 pads at a time and has to change multiple times daily.  He has intermittency.  He has an adequate stream.   He remains on oxybutynin tid.  He has no hematuria but has some mild dysuria if he doesn't hydrate.   The urine is no longer malodorous.   His IPSS is 23/5.      He has a history of T3b N0 M0 Gleason 9 prostate cancer that was treated with EXRT and ADT.   He completed EXRT in 9/19.   His final  Lupron injection was given 1/21.  His PSA remains <0.1 on 08/21/19 and his testosterone remains low at 15.      He has been using some Sildenafil for ED with some benefit.     ROS:  ROS:  A complete review of systems was performed.  All systems are negative except for pertinent findings as noted.   ROS  Allergies  Allergen Reactions  . Ciprofloxacin Other (See Comments)    Doesn't work  . Levaquin [Levofloxacin] Other (See Comments)    Doesn't work    Outpatient Encounter Medications as of 09/11/2019  Medication Sig  . atenolol-chlorthalidone (TENORETIC) 50-25 MG tablet Take 1 tablet by mouth at bedtime.   . ciclopirox (PENLAC) 8 % solution Apply topically at bedtime. Apply over nail and surrounding skin. Apply daily over previous coat. After seven (7) days, may remove with alcohol and continue cycle.  . diclofenac Sodium (VOLTAREN) 1 % GEL Apply 2 g topically 4 (four) times daily as needed (pain).  [name] escitalopram (LEXAPRO) 5 MG tablet Take 5 mg by mouth daily.  [name] FREESTYLE LITE test strip   . gabapentin (NEURONTIN) 400 MG capsule Take 400 mg by mouth at bedtime.   .  hydroquinone 4 % cream Apply topically 2 (two) times daily.  [name] ibuprofen (ADVIL) 600 MG tablet   . Incontinence Supply Disposable (FITTED BRIEFS MAXIMUM XL) MISC Use briefs as needed daily for incontinence  . Lancets (FREESTYLE) lancets   . LANTUS SOLOSTAR 100 UNIT/ML Solostar Pen Inject 2.5 Units into the skin daily.   [name] LINZESS 72 MCG capsule   . metFORMIN (GLUCOPHAGE) 500 MG tablet metformin 500 mg tablet  . metoCLOPramide (REGLAN) 5 MG tablet Take 5 mg by mouth in the morning, at noon, and at bedtime.  . mupirocin cream (BACTROBAN) 2 % Apply 1 application topically 2 (two) times daily as needed (wound care).  . mupirocin ointment (BACTROBAN) 2 % Apply topically.  . nystatin ointment (MYCOSTATIN)   . oxybutynin (DITROPAN) 5 MG tablet Take 1 tablet (5 mg total) by mouth 3 (three) times daily.  . Oxycodone HCl 20 MG TABS Take 5 mg by mouth in the morning, at noon, in the evening, and at bedtime.   . pantoprazole (PROTONIX) 40 MG tablet Take 1 tablet (40 mg total) by mouth 2 (two) times daily before a meal.  . potassium chloride (KLOR-CON) 10 MEQ tablet Take 10 mEq by mouth daily.  . predniSONE (DELTASONE) 20 MG tablet Take 20 mg  by mouth at bedtime.   . psyllium (METAMUCIL SMOOTH TEXTURE) 58.6 % powder Take 1 packet by mouth daily. (Patient taking differently: Take 1 packet by mouth every other day. )  . sildenafil (VIAGRA) 100 MG tablet Take 100 mg by mouth daily as needed for erectile dysfunction.   . terbinafine (LAMISIL) 250 MG tablet Take 250 mg by mouth daily.  . timolol (TIMOPTIC) 0.5 % ophthalmic solution Place 1 drop into both eyes 2 (two) times daily as needed (eye pressure).  . nystatin-triamcinolone ointment (MYCOLOG) Apply 1 application topically 2 (two) times daily. To groins   No facility-administered encounter medications on file as of 09/11/2019.    Past Medical History:  Diagnosis Date  . Acquired bladder diverticulum    12/ 2012  resection diverticulum done at [facility]  in [place], [place]  . Age-related cataract of right eye    scheduled for catarat extraction 09-12-2017  . Agent orange exposure   . Aneurysm of infrarenal abdominal aorta ([place])    first dx 2017--- last CT 06-11-2017  measures 3.5cm  . Chronic constipation   . COPD with emphysema ([place])    06--12-2017  per pt no symptoms since Feb 2019  . Depression    PTSD  . Diabetes mellitus type 2, diet-controlled ([place])   . Diabetic neuropathy ([place])   . Dyslipidemia   . Dyspnea on exertion   . Erectile dysfunction   . Feeling of incomplete bladder emptying   . GERD (gastroesophageal reflux disease)   . Gout    09-02-2017 last flare up 3 months ago  . Hiatal hernia   . History of atrial fibrillation    episode post op right lung lobectomy 07-04-2015  . History of bladder stone   . History of colon polyps   . History of DVT of lower extremity    03/ 2019  bilateral lower extremity (superficial) ---  per treated w/ oral medication   . History of gastritis   . History of kidney stones   . History of radiation therapy 09-14-2015  to 09-21-2015   left upper lung nodule -- 54Gy in 3 fractions (18 Gy per fraction)  . Hyperplasia of prostate with lower urinary tract symptoms (LUTS)   . Hypertension   . Lumbar spinal stenosis   . Nephrolithiasis   . Neurogenic bladder   . Osteoarthritis    ankle, hands  . Osteoporosis   . Prostate cancer [facility]) urologist-  dr Katelen Luepke/  oncologist-  dr Tammi Klippel   dx 05-24-2017-- Stage T2b,  Gleason 4+4,  PSA 22.41,  vol 38cc--- started ADT 04/ 2019,  plan external radiation therapy  . Radiation fibrosis of lung ([place])    hx left upper long nodule SBRT 06/ 2017  . Squamous cell carcinoma of both lungs ([place]) last CT in epic dated 06-26-2017 no recurrence   dx 03/ 2017  non-small cell SCC via bronchoscopy w/ bx's by dr Roxan Hockey---  s/p  right VATS w/ right lobectomy and node dissection's 07-04-2015 /  pt had SBRT to left upper nodule Stage 1 ([facility]) completed  09-21-2015  . Wears dentures    bottom  . Wears glasses     Past Surgical History:  Procedure Laterality Date  . BIOPSY  05/29/2019   Procedure: BIOPSY;  Surgeon: Rogene Houston, MD;  [facility];  Service: Endoscopy;;  gastric ulcer  . CARPAL TUNNEL RELEASE Right 07/09/2014   Procedure: RIGHT OPEN CARPAL TUNNEL RELEASE;  Surgeon: Mcarthur Rossetti, MD;  [facility];  Service: Orthopedics;  Laterality: Right;  . CHOLECYSTECTOMY N/A 02/24/2014   Procedure: LAPAROSCOPIC CHOLECYSTECTOMY;  Surgeon: Coralie Keens, MD;  Location: [facility];  Service: General;  Laterality: N/A;  . COLONOSCOPY    . CYSTOLITHOTOMY  [facility] [place]  . CYSTOSCOPY N/A 04/18/2018   Procedure: CYSTOSCOPY FLEXIBLE;  Surgeon: Irine Seal, MD;  Location: [facility];  Service: Urology;  Laterality: N/A;  . CYSTOSCOPY W/ LITHOLAPAXY / EHL  02-24-2007   dr Janice Norrie  [facility]  . dental implant     No teeth at this time waiting for them to be made as of 01-30-19  . ESOPHAGOGASTRODUODENOSCOPY (EGD) WITH PROPOFOL N/A 05/29/2019   Procedure: ESOPHAGOGASTRODUODENOSCOPY (EGD) WITH PROPOFOL;  Surgeon: Rogene Houston, MD;  [facility];  Service: Endoscopy;  Laterality: N/A;  925  . GOLD SEED IMPLANT N/A 09/05/2017   Procedure: GOLD SEED IMPLANT;  Surgeon: Irine Seal, MD;  Location[facility];  Service: Urology;  Laterality: N/A;  . INSERTION OF SUPRAPUBIC CATHETER N/A 04/18/2018   Procedure: SUPRAPUBIC TUBE CHANGE;  Surgeon: Irine Seal, MD;  [facility];  Service: Urology;  Laterality: N/A;  . IR CATHETER TUBE CHANGE  02/24/2018  . SPACE OAR INSTILLATION N/A 09/05/2017   Procedure: SPACE OAR INSTILLATION;  Surgeon: Irine Seal, MD;  Location[facility];  Service: Urology;  Laterality: N/A;  . TOTAL ANKLE ARTHROPLASTY Right 04/20/2015   Procedure: TOTAL ANKLE ARTHOPLASTY;  Surgeon: Newt Minion, MD;  Location: [place];  Service: Orthopedics;   Laterality: Right;  . TRANSTHORACIC ECHOCARDIOGRAM  11/16/2012   ef 55-60%,  grade 1 diastolic dysfunction/  mild dilated ascending aorta/  mild MR/ trivial TR  . TRANSURETHRAL RESECTION OF PROSTATE N/A 02/03/2019   Procedure: TRANSURETHRAL RESECTION OF THE PROSTATE (TURP);  Surgeon: Irine Seal, MD;  [facility];  Service: Urology;  Laterality: N/A;  . VIDEO ASSISTED THORACOSCOPY (VATS)/ LOBECTOMY Right 07/04/2015   Procedure: VIDEO ASSISTED THORACOSCOPY (VATS)/ RIGHT UPPER LOBECTOMY;  Surgeon: Melrose Nakayama, MD;  Location: [place];  Service: Thoracic;  Laterality: Right;  [name] VIDEO BRONCHOSCOPY N/A 06/17/2015   Procedure: VIDEO BRONCHOSCOPY;  Surgeon: Melrose Nakayama, MD;  Location: [place];  Service: Thoracic;  Laterality: N/A;  . VIDEO BRONCHOSCOPY WITH ENDOBRONCHIAL NAVIGATION N/A 06/17/2015   Procedure: VIDEO BRONCHOSCOPY WITH ENDOBRONCHIAL NAVIGATION;  Surgeon: Melrose Nakayama, MD;  Location: [place];  Service: Thoracic;  Laterality: N/A;  . VIDEO BRONCHOSCOPY WITH ENDOBRONCHIAL ULTRASOUND N/A 06/17/2015   Procedure: VIDEO BRONCHOSCOPY WITH ENDOBRONCHIAL ULTRASOUND;  Surgeon: Melrose Nakayama, MD;  Location: [place];  Service: Thoracic;  Laterality: N/A;    Social History   Socioeconomic History  . Marital status: Widowed    Spouse name: Not on file  . Number of children: 2  . Years of education: college  . Highest education level: Not on file  Occupational History  . Occupation: retired  Tobacco Use  . Smoking status: Former Smoker    Packs/day: 0.50    Years: 40.00    Pack years: 20.00    Types: Cigarettes    Quit date: 02/13/2015    Years since quitting: 4.5  . Smokeless tobacco: Never Used  Vaping Use  . Vaping Use: Never used  Substance and Sexual Activity  . Alcohol use: No    Alcohol/week: 0.0 standard drinks  . Drug use: No  . Sexual activity: Not Currently  Other  Topics Concern  . Not on file  Social History Narrative  . Not on file   Social  Determinants of Health   Financial Resource Strain:   . Difficulty of Paying Living Expenses:   Food Insecurity:   . Worried About Charity fundraiser in the Last Year:   . Arboriculturist in the Last Year:   Transportation Needs:   . Film/video editor (Medical):   [name] Lack of Transportation (Non-Medical):   Physical Activity:   . Days of Exercise per Week:   . Minutes of Exercise per Session:   Stress:   . Feeling of Stress :   Social Connections:   . Frequency of Communication with Friends and Family:   . Frequency of Social Gatherings with Friends and Family:   . Attends Religious Services:   . Active Member of Clubs or Organizations:   . Attends Archivist Meetings:   [name] Marital Status:   Intimate Partner Violence:   . Fear of Current or Ex-Partner:   . Emotionally Abused:   [name] Physically Abused:   . Sexually Abused:     Family History  Problem Relation Age of Onset  . Cancer Father        Asbestos  . Colon cancer Neg Hx   . Colon polyps Neg Hx   . Kidney disease Neg Hx   . Esophageal cancer Neg Hx   . Gallbladder disease Neg Hx   . Heart disease Neg Hx   . Diabetes Neg Hx        Objective: Vitals:   09/11/19 1026  BP: 127/73  Pulse: 73  Temp: 97.7 F (36.5 C)     Physical Exam  Lab Results:  Results for orders placed or performed in visit on 09/11/19 (from the past 24 hour(s))  POCT urinalysis dipstick     Status: Abnormal   Collection Time: 09/11/19 11:07 AM  Result Value Ref Range   Color, UA yellow    Clarity, UA cloudy    Glucose, UA Negative Negative   Bilirubin, UA small    Ketones, UA neg    Spec Grav, UA 1.020 1.010 - 1.025   Blood, UA moderate    pH, UA 5.0 5.0 - 8.0   Protein, UA Positive (A) Negative   Urobilinogen, UA negative (A) 0.2 or 1.0 E.U./dL   Nitrite, UA neg    Leukocytes, UA Moderate (2+) (A) Negative   Appearance     Odor      BMET No results for input(s): NA, K, CL, CO2, GLUCOSE, BUN, CREATININE,  CALCIUM in the last 72 hours. PSA PSA  Date Value Ref Range Status  08/21/2019 <0.1 < OR = 4.0 ng/mL Final    Comment:    The total PSA value from this assay system is  standardized against the WHO standard. The test  result will be approximately 20% lower when compared  to the equimolar-standardized total PSA (Beckman  Coulter). Comparison of serial PSA results should be  interpreted with this fact in mind. . This test was performed using the Siemens  chemiluminescent method. Values obtained from  different assay methods cannot be used interchangeably. PSA levels, regardless of value, should not be interpreted as absolute evidence of the presence or absence of disease.   04/06/2019 <0.1 < OR = 4.0 ng/mL Final    Comment:    The total PSA value from this assay system is  standardized against the WHO standard. The test  result will be approximately 20% lower when compared  to the equimolar-standardized total PSA (Beckman  Coulter). Comparison of serial PSA results should be  interpreted with this fact in mind. . This test was performed using the Siemens  chemiluminescent method. Values obtained from  different assay methods cannot be used interchangeably. PSA levels, regardless of value, should not be interpreted as absolute evidence of the presence or absence of disease.   10/26/2008 0.69 0.10 - 4.00 ng/mL Final    Comment:    See lab report for associated comment(s)   Testosterone  Date Value Ref Range Status  08/21/2019 15 (L) 250 - 827 ng/dL Final    Comment:    In hypogonadal males, Testosterone, Total, LC/MS/MS, is the recommended assay due to the diminished accuracy of immunoassay at levels below 250 ng/dL. This test code [id number]) must be collected in a red-top tube with no gel.    04/06/2019 14 (L) 250 - 827 ng/dL Final    Comment:    In hypogonadal males, Testosterone, Total, LC/MS/MS, is the recommended assay due to the diminished accuracy of immunoassay  at levels below 250 ng/dL. This test code [phone]) must be collected in a red-top tube with no gel.    05/20/2017 255 (L) 264 - 916 ng/dL Final    Comment:    (NOTE) Adult male reference interval is based on a population of healthy nonobese males (BMI <30) between 64 and 35 years old. [place], [place] [phone]. PMID: [id number]. Performed At: [facility] [place], [place] [id number] Rush Farmer MD [id number] Performed at [facility], [address] [place], [place] [address]       Studies/Results: PSA and T reviewed.     Assessment & Plan: Prostate cancer.   He got his last Lupron in 1/21.  He will have a PSA and testosterone in 6 months.   OAB with incontinence.  This has worsened.  I am going to repeat urodynamics.  He was given a script for a condom cath to take to the [place].   He remains on Oxybutynin.    He has irritation from the incontinence and I will give him a script for mycolog for him to take to the [place].   ED.  He has not been using the sildenafil recently.   Meds ordered this encounter  Medications  . nystatin-triamcinolone ointment (MYCOLOG)    Sig: Apply 1 application topically 2 (two) times daily. To groins    Dispense:  30 g    Refill:  11     Orders Placed This Encounter  Procedures  . Testosterone    Standing Status:   Future    Standing Expiration Date:   09/10/2020  . PSA    Standing Status:   Future    Standing Expiration Date:   09/10/2020  . POCT urinalysis dipstick      Return in about 6 months (around 03/12/2020) for with PSA and testosterone. .   CC: [name], PA-C      Irine Seal 09/11/2019

## 2019-09-11 ENCOUNTER — Other Ambulatory Visit: Payer: Self-pay

## 2019-09-11 ENCOUNTER — Other Ambulatory Visit: Payer: Self-pay | Admitting: Urology

## 2019-09-11 ENCOUNTER — Ambulatory Visit (INDEPENDENT_AMBULATORY_CARE_PROVIDER_SITE_OTHER): Payer: Medicare Other | Admitting: Urology

## 2019-09-11 ENCOUNTER — Other Ambulatory Visit (HOSPITAL_COMMUNITY)
Admission: RE | Admit: 2019-09-11 | Discharge: 2019-09-11 | Disposition: A | Discharge status: Home / Self Care | Payer: Medicare Other | Source: Other Acute Inpatient Hospital | Attending: Urology | Admitting: Urology

## 2019-09-11 VITALS — BP 127/73 | HR 73 | Temp 97.7°F | Ht >= 80 in | Wt 253.0 lb

## 2019-09-11 DIAGNOSIS — Z8546 Personal history of malignant neoplasm of prostate: Secondary | ICD-10-CM | POA: Diagnosis not present

## 2019-09-11 DIAGNOSIS — B3749 Other urogenital candidiasis: Secondary | ICD-10-CM | POA: Diagnosis not present

## 2019-09-11 DIAGNOSIS — R350 Frequency of micturition: Secondary | ICD-10-CM | POA: Diagnosis not present

## 2019-09-11 DIAGNOSIS — R3915 Urgency of urination: Secondary | ICD-10-CM | POA: Insufficient documentation

## 2019-09-11 DIAGNOSIS — N393 Stress incontinence (female) (male): Secondary | ICD-10-CM

## 2019-09-11 LAB — POCT URINALYSIS DIPSTICK
Glucose, UA: NEGATIVE
Ketones, UA: NEGATIVE
Nitrite, UA: NEGATIVE
Protein, UA: POSITIVE — AB
Spec Grav, UA: 1.02 (ref 1.010–1.025)
Urobilinogen, UA: NEGATIVE E.U./dL — AB
pH, UA: 5 (ref 5.0–8.0)

## 2019-09-11 MED ORDER — NYSTATIN-TRIAMCINOLONE 100000-0.1 UNIT/GM-% EX OINT
1.0000 | TOPICAL_OINTMENT | Freq: Two times a day (BID) | CUTANEOUS | 11 refills | Status: DC
Start: 2019-09-11 — End: 2022-12-30

## 2019-09-11 NOTE — Progress Notes (Signed)
Urological Symptom Review  Patient is experiencing the following symptoms: Frequent urination Hard to postpone urination Burning/pain with urination Get up at night to urinate Leakage of urine   Review of Systems  Gastrointestinal (upper)  : Negative for upper GI symptoms  Gastrointestinal (lower) : Negative for lower GI symptoms  Constitutional : Negative for symptoms  Skin: Negative for skin symptoms  Eyes: Negative for eye symptoms  Ear/Nose/Throat : Negative for Ear/Nose/Throat symptoms  Hematologic/Lymphatic: Negative for Hematologic/Lymphatic symptoms  Cardiovascular : Negative for cardiovascular symptoms  Respiratory : Negative for respiratory symptoms  Endocrine: Negative for endocrine symptoms  Musculoskeletal: Negative for musculoskeletal symptoms  Neurological: Negative for neurological symptoms  Psychologic: Negative for psychiatric symptoms

## 2019-09-14 ENCOUNTER — Other Ambulatory Visit: Payer: Self-pay | Admitting: Urology

## 2019-09-14 ENCOUNTER — Telehealth: Payer: Self-pay

## 2019-09-14 DIAGNOSIS — N3000 Acute cystitis without hematuria: Secondary | ICD-10-CM

## 2019-09-14 LAB — URINE CULTURE: Culture: >=100000 — AB

## 2019-09-14 MED ORDER — AMPICILLIN 500 MG PO CAPS: 500.0000 mg | ORAL_CAPSULE | Freq: Four times a day (QID) | ORAL | 0 refills | Status: DC

## 2019-09-14 NOTE — Telephone Encounter (Signed)
Pt called and made aware of culture results and abt sent to pharmacy.

## 2019-09-16 ENCOUNTER — Other Ambulatory Visit: Payer: Self-pay | Admitting: Urology

## 2019-09-16 MED ORDER — AMPICILLIN 500 MG PO CAPS: 500.0000 mg | ORAL_CAPSULE | Freq: Every day | ORAL | 0 refills | Status: DC

## 2019-09-17 ENCOUNTER — Telehealth: Payer: Self-pay

## 2019-09-17 NOTE — Telephone Encounter (Signed)
Pt called complaining of urinary frequency. Pt states that he has been drinking a lot of water. Pt informed to cut back on water intake and to call back if symptoms worsen or persist.

## 2019-09-18 ENCOUNTER — Other Ambulatory Visit (HOSPITAL_COMMUNITY)
Admission: RE | Admit: 2019-09-18 | Discharge: 2019-09-18 | Disposition: A | Discharge status: Home / Self Care | Payer: Medicare Other | Source: Other Acute Inpatient Hospital | Attending: Urology | Admitting: Urology

## 2019-09-18 ENCOUNTER — Other Ambulatory Visit: Payer: Self-pay

## 2019-09-18 ENCOUNTER — Ambulatory Visit: Payer: Medicare Other | Admitting: Urology

## 2019-09-18 DIAGNOSIS — N3000 Acute cystitis without hematuria: Secondary | ICD-10-CM

## 2019-09-20 LAB — URINE CULTURE: Culture: 80000 — AB

## 2019-09-21 ENCOUNTER — Other Ambulatory Visit: Payer: Self-pay

## 2019-09-21 DIAGNOSIS — N390 Urinary tract infection, site not specified: Secondary | ICD-10-CM

## 2019-09-21 MED ORDER — SULFAMETHOXAZOLE-TRIMETHOPRIM 800-160 MG PO TABS: 1.0000 | ORAL_TABLET | Freq: Two times a day (BID) | ORAL | 0 refills | Status: DC

## 2019-09-21 NOTE — Progress Notes (Signed)
Pt. called and made aware of new rx.

## 2019-10-02 ENCOUNTER — Telehealth: Payer: Self-pay | Admitting: Orthopedic Surgery

## 2019-10-02 NOTE — Telephone Encounter (Signed)
Patient appointment was made for 10/12/19 for left & right ankle nexus dictation pertaining to brace that he is needing.

## 2019-10-02 NOTE — Telephone Encounter (Signed)
Patient called.   He is requesting a call back to obtain some evaluation notes. He said his providers would know what it is that he's asking for  Call back: [phone]

## 2019-10-04 ENCOUNTER — Emergency Department (HOSPITAL_COMMUNITY)
Admission: EM | Admit: 2019-10-04 | Discharge: 2019-10-04 | Disposition: A | Discharge status: Home / Self Care | Payer: Medicare Other | Attending: Emergency Medicine | Admitting: Emergency Medicine

## 2019-10-04 ENCOUNTER — Other Ambulatory Visit: Payer: Self-pay

## 2019-10-04 ENCOUNTER — Emergency Department (HOSPITAL_COMMUNITY): Payer: Medicare Other

## 2019-10-04 DIAGNOSIS — Z87891 Personal history of nicotine dependence: Secondary | ICD-10-CM | POA: Diagnosis not present

## 2019-10-04 DIAGNOSIS — C61 Malignant neoplasm of prostate: Secondary | ICD-10-CM | POA: Diagnosis not present

## 2019-10-04 DIAGNOSIS — N200 Calculus of kidney: Secondary | ICD-10-CM | POA: Diagnosis not present

## 2019-10-04 DIAGNOSIS — R350 Frequency of micturition: Secondary | ICD-10-CM | POA: Diagnosis present

## 2019-10-04 DIAGNOSIS — Z85118 Personal history of other malignant neoplasm of bronchus and lung: Secondary | ICD-10-CM | POA: Diagnosis not present

## 2019-10-04 DIAGNOSIS — Z7984 Long term (current) use of oral hypoglycemic drugs: Secondary | ICD-10-CM | POA: Insufficient documentation

## 2019-10-04 DIAGNOSIS — E1140 Type 2 diabetes mellitus with diabetic neuropathy, unspecified: Secondary | ICD-10-CM | POA: Diagnosis not present

## 2019-10-04 DIAGNOSIS — Z79899 Other long term (current) drug therapy: Secondary | ICD-10-CM | POA: Insufficient documentation

## 2019-10-04 DIAGNOSIS — Z96661 Presence of right artificial ankle joint: Secondary | ICD-10-CM | POA: Insufficient documentation

## 2019-10-04 DIAGNOSIS — I10 Essential (primary) hypertension: Secondary | ICD-10-CM | POA: Diagnosis not present

## 2019-10-04 DIAGNOSIS — R300 Dysuria: Secondary | ICD-10-CM | POA: Diagnosis not present

## 2019-10-04 DIAGNOSIS — N3000 Acute cystitis without hematuria: Secondary | ICD-10-CM | POA: Insufficient documentation

## 2019-10-04 DIAGNOSIS — J449 Chronic obstructive pulmonary disease, unspecified: Secondary | ICD-10-CM | POA: Diagnosis not present

## 2019-10-04 DIAGNOSIS — E1142 Type 2 diabetes mellitus with diabetic polyneuropathy: Secondary | ICD-10-CM | POA: Diagnosis not present

## 2019-10-04 LAB — URINALYSIS, ROUTINE W REFLEX MICROSCOPIC
Bilirubin Urine: NEGATIVE
Glucose, UA: NEGATIVE mg/dL
Hgb urine dipstick: NEGATIVE
Ketones, ur: NEGATIVE mg/dL
Nitrite: NEGATIVE
Protein, ur: 30 mg/dL — AB
Specific Gravity, Urine: 1.017 (ref 1.005–1.030)
WBC, UA: >50 WBC/hpf — ABNORMAL HIGH (ref 0–5)
pH: 5 (ref 5.0–8.0)

## 2019-10-04 LAB — BASIC METABOLIC PANEL
Anion gap: 9 (ref 5–15)
BUN: 13 mg/dL (ref 8–23)
CO2: 26 mmol/L (ref 22–32)
Calcium: 9 mg/dL (ref 8.9–10.3)
Chloride: 102 mmol/L (ref 98–111)
Creatinine, Ser: 1.26 mg/dL — ABNORMAL HIGH (ref 0.61–1.24)
GFR calc Af Amer: >60 mL/min (ref >60)
GFR calc non Af Amer: 55 mL/min — ABNORMAL LOW (ref >60)
Glucose, Bld: 102 mg/dL — ABNORMAL HIGH (ref 70–99)
Potassium: 3.6 mmol/L (ref 3.5–5.1)
Sodium: 137 mmol/L (ref 135–145)

## 2019-10-04 LAB — CBC WITH DIFFERENTIAL/PLATELET
Abs Immature Granulocytes: 0.02 10*3/uL (ref 0.00–0.07)
Basophils Absolute: 0 10*3/uL (ref 0.0–0.1)
Basophils Relative: 0 %
Eosinophils Absolute: 0.1 10*3/uL (ref 0.0–0.5)
Eosinophils Relative: 3 %
HCT: 35.5 % — ABNORMAL LOW (ref 39.0–52.0)
Hemoglobin: 11.6 g/dL — ABNORMAL LOW (ref 13.0–17.0)
Immature Granulocytes: 0 %
Lymphocytes Relative: 25 %
Lymphs Abs: 1.3 10*3/uL (ref 0.7–4.0)
MCH: 30.4 pg (ref 26.0–34.0)
MCHC: 32.7 g/dL (ref 30.0–36.0)
MCV: 92.9 fL (ref 80.0–100.0)
Monocytes Absolute: 0.4 10*3/uL (ref 0.1–1.0)
Monocytes Relative: 9 %
Neutro Abs: 3.2 10*3/uL (ref 1.7–7.7)
Neutrophils Relative %: 63 %
Platelets: 188 10*3/uL (ref 150–400)
RBC: 3.82 MIL/uL — ABNORMAL LOW (ref 4.22–5.81)
RDW: 15.3 % (ref 11.5–15.5)
WBC: 5 10*3/uL (ref 4.0–10.5)
nRBC: 0 % (ref 0.0–0.2)

## 2019-10-04 MED ORDER — CIPROFLOXACIN HCL 250 MG PO TABS
250.0000 mg | ORAL_TABLET | Freq: Two times a day (BID) | ORAL | 0 refills | Status: DC
Start: 2019-10-04 — End: 2020-03-09

## 2019-10-04 MED ORDER — LIDOCAINE 5 % EX PTCH
1.0000 | MEDICATED_PATCH | CUTANEOUS | Status: DC
  Administered 2019-10-04: 1 via TRANSDERMAL
  Filled 2019-10-04: qty 1

## 2019-10-04 NOTE — Discharge Instructions (Signed)
As discussed, your urine was significant for a urinary tract infection.  I am sending you home with ciprofloxacin.  Take as prescribed and finish all antibiotics.  Please call Dr. Ralene Muskrat office tomorrow for further evaluation given you were just recently treated for UTI. Please discuss appropriate antibiotics with him. Return to the ER if you develop a fever or worsening symptoms. Return to the ER for new or worsening symptoms.

## 2019-10-04 NOTE — ED Triage Notes (Signed)
Patient reports he had prostate cancer and was given antibiotics. States that now he has frequent urination and left flank pain. Pain rated 8/10

## 2019-10-04 NOTE — ED Notes (Signed)
An After Visit Summary was printed and given to the patient. Discharge instructions given and no further questions at this time.  

## 2019-10-04 NOTE — ED Provider Notes (Signed)
[facility] DEPT Provider Note   CSN: [id number] Arrival date & time: 10/04/19  1007     History Chief Complaint  Patient presents with  . urinary symptoms    Jonathan [name] is a 79 y.o. male with a past medical history significant for chronic constipation, COPD, depression, diabetes, hyperlipidemia, GERD, history of prostate cancer, and chronic UTIs who presents to the ED due to urinary frequency and dysuria for the past 3 days.  Patient has a history of chronic UTIs and notes this feels similar to his past UTIs.  Patient is followed by Dr. Jeffie Pollock at [facility].  He was last treated for UTI on 09/21/2019 with Bactrim.  Urinary symptoms associated with bilateral low back pain.  Patient states he has a history of kidney stones.  Denies hematuria.  Denies fever and chills.  Rates his pain 8/10.  Patient is on chronic oxycodone which she has been taking for his pain.  History obtained from patient and past medical records. No interpreter used during encounter.      Past Medical History:  Diagnosis Date  . Acquired bladder diverticulum    12/ 2012  resection diverticulum done at [facility] in [place], [place]  . Age-related cataract of right eye    scheduled for catarat extraction 09-12-2017  . Agent orange exposure   . Aneurysm of infrarenal abdominal aorta ([place])    first dx 2017--- last CT 06-11-2017  measures 3.5cm  . Chronic constipation   . COPD with emphysema ([place])    06--12-2017  per pt no symptoms since Feb 2019  . Depression    PTSD  . Diabetes mellitus type 2, diet-controlled ([place])   . Diabetic neuropathy ([place])   . Dyslipidemia   . Dyspnea on exertion   . Erectile dysfunction   . Feeling of incomplete bladder emptying   . GERD (gastroesophageal reflux disease)   . Gout    09-02-2017 last flare up 3 months ago  . Hiatal hernia   . History of atrial fibrillation    episode post op right lung lobectomy 07-04-2015  . History of bladder stone    . History of colon polyps   . History of DVT of lower extremity    03/ 2019  bilateral lower extremity (superficial) ---  per treated w/ oral medication   . History of gastritis   . History of kidney stones   . History of radiation therapy 09-14-2015  to 09-21-2015   left upper lung nodule -- 54Gy in 3 fractions (18 Gy per fraction)  . Hyperplasia of prostate with lower urinary tract symptoms (LUTS)   . Hypertension   . Lumbar spinal stenosis   . Nephrolithiasis   . Neurogenic bladder   . Osteoarthritis    ankle, hands  . Osteoporosis   . Prostate cancer [facility]) urologist-  dr wrenn/  oncologist-  dr Tammi Klippel   dx 05-24-2017-- Stage T2b,  Gleason 4+4,  PSA 22.41,  vol 38cc--- started ADT 04/ 2019,  plan external radiation therapy  . Radiation fibrosis of lung ([place])    hx left upper long nodule SBRT 06/ 2017  . Squamous cell carcinoma of both lungs ([place]) last CT in epic dated 06-26-2017 no recurrence   dx 03/ 2017  non-small cell SCC via bronchoscopy w/ bx's by dr Roxan Hockey---  s/p  right VATS w/ right lobectomy and node dissection's 07-04-2015 /  pt had SBRT to left upper nodule Stage 1 ([facility]) completed 09-21-2015  .  Wears dentures    bottom  . Wears glasses     Patient Active Problem List   Diagnosis Date Noted  . History of peptic ulcer disease   . History of prostate cancer 04/10/2019  . Urge incontinence 04/10/2019  . Urinary retention 04/18/2018  . Malignant neoplasm of prostate ([place]) 07/10/2017  . Arthritis of right subtalar joint 04/18/2017  . 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 Idiopathic chronic venous hypertension of both lower extremities with inflammation 05/22/2016  . Fibrosis of subtalar joint, right 03/08/2016  . Primary malignant neoplasm of left upper lobe of lung ([place]) 09/21/2015  . Lung cancer ([place]) 07/04/2015  . Diabetes mellitus type II, controlled ([place]) 06/01/2015  . Neoplasm of uncertain behavior of right upper  lobe of lung 05/31/2015  . 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 Emphysema of lung ([place])   . Kidney stones   . Osteoporosis   . H/O total ankle replacement, right 04/20/2015  . Carpal tunnel syndrome - right 07/09/2014  . Bloating 06/14/2014  . History of colonic polyps 06/14/2014  . Diabetic polyneuropathy associated with type 2 diabetes mellitus ([place]) 12/05/2012  . Lesion of ulnar nerve 12/05/2012  . Syncope 11/15/2012  . Gout 11/15/2012  . Chronic constipation 04/02/2009  . CYSTITIS, RECURRENT 12/02/2008  . COPD 10/26/2008  . DIVERTICULUM, BLADDER 10/11/2008  . INSOMNIA 07/24/2008  . Acute cystitis without hematuria 07/20/2008  . FATIGUE 07/20/2008  . DEPRESSION 02/27/2008  . CHEST PAIN UNSPECIFIED 02/27/2008  . NICOTINE ADDICTION 08/12/2007  . DYSLIPIDEMIA 07/22/2007  . OBESITY 07/22/2007  . ERECTILE DYSFUNCTION 07/22/2007  . HYPERTENSION 07/22/2007    Past Surgical History:  Procedure Laterality Date  . BIOPSY  05/29/2019   Procedure: BIOPSY;  Surgeon: Rogene Houston, MD;  [facility];  Service: Endoscopy;;  gastric ulcer  . CARPAL TUNNEL RELEASE Right 07/09/2014   Procedure: RIGHT OPEN CARPAL TUNNEL RELEASE;  Surgeon: Mcarthur Rossetti, MD;  Location: [facility];  Service: Orthopedics;  Laterality: Right;  . CHOLECYSTECTOMY N/A 02/24/2014   Procedure: LAPAROSCOPIC CHOLECYSTECTOMY;  Surgeon: Coralie Keens, MD;  Location: [facility];  Service: General;  Laterality: N/A;  . COLONOSCOPY    . CYSTOLITHOTOMY  11/ [facility]      [facility] [place]  . CYSTOSCOPY N/A 04/18/2018   Procedure: CYSTOSCOPY FLEXIBLE;  Surgeon: Irine Seal, MD;  Location: [facility];  Service: Urology;  Laterality: N/A;  . CYSTOSCOPY W/ LITHOLAPAXY / EHL  02-24-2007   dr Janice Norrie  [facility]  . dental implant     No teeth at this time waiting for them to be made as of 01-30-19  . ESOPHAGOGASTRODUODENOSCOPY (EGD) WITH PROPOFOL N/A 05/29/2019   Procedure:  ESOPHAGOGASTRODUODENOSCOPY (EGD) WITH PROPOFOL;  Surgeon: Rogene Houston, MD;  [facility];  Service: Endoscopy;  Laterality: N/A;  925  . GOLD SEED IMPLANT N/A 09/05/2017   Procedure: GOLD SEED IMPLANT;  Surgeon: Irine Seal, MD;  Location: [facility];  Service: Urology;  Laterality: N/A;  . INSERTION OF SUPRAPUBIC CATHETER N/A 04/18/2018   Procedure: SUPRAPUBIC TUBE CHANGE;  Surgeon: Irine Seal, MD;  Location: [facility];  Service: Urology;  Laterality: N/A;  . IR CATHETER TUBE CHANGE  02/24/2018  . SPACE OAR INSTILLATION N/A 09/05/2017   Procedure: SPACE OAR INSTILLATION;  Surgeon: Irine Seal, MD;  Location: [facility];  Service: Urology;  Laterality: N/A;  . TOTAL ANKLE ARTHROPLASTY Right  04/20/2015   Procedure: TOTAL ANKLE ARTHOPLASTY;  Surgeon: Newt Minion, MD;  Location: [facility];  Service: Orthopedics;  Laterality: Right;  . TRANSTHORACIC ECHOCARDIOGRAM  11/16/2012   ef 55-60%,  grade 1 diastolic dysfunction/  mild dilated ascending aorta/  mild MR/ trivial TR  . TRANSURETHRAL RESECTION OF PROSTATE N/A 02/03/2019   Procedure: TRANSURETHRAL RESECTION OF THE PROSTATE (TURP);  Surgeon: Irine Seal, MD;  [facility];  Service: Urology;  Laterality: N/A;  . VIDEO ASSISTED THORACOSCOPY (VATS)/ LOBECTOMY Right 07/04/2015   Procedure: VIDEO ASSISTED THORACOSCOPY (VATS)/ RIGHT UPPER LOBECTOMY;  Surgeon: Melrose Nakayama, MD;  Location: [facility];  Service: Thoracic;  Laterality: Right;  [name] VIDEO BRONCHOSCOPY N/A 06/17/2015   Procedure: VIDEO BRONCHOSCOPY;  Surgeon: Melrose Nakayama, MD;  Location: [facility];  Service: Thoracic;  Laterality: N/A;  . VIDEO BRONCHOSCOPY WITH ENDOBRONCHIAL NAVIGATION N/A 06/17/2015   Procedure: VIDEO BRONCHOSCOPY WITH ENDOBRONCHIAL NAVIGATION;  Surgeon: Melrose Nakayama, MD;  Location: [facility];  Service: Thoracic;  Laterality: N/A;  . VIDEO BRONCHOSCOPY WITH ENDOBRONCHIAL ULTRASOUND N/A 06/17/2015   Procedure: VIDEO  BRONCHOSCOPY WITH ENDOBRONCHIAL ULTRASOUND;  Surgeon: Melrose Nakayama, MD;  [facility];  Service: Thoracic;  Laterality: N/A;       Family History  Problem Relation Age of Onset  . Cancer Father        Asbestos  . Colon cancer Neg Hx   . Colon polyps Neg Hx   . Kidney disease Neg Hx   . Esophageal cancer Neg Hx   . Gallbladder disease Neg Hx   . Heart disease Neg Hx   . Diabetes Neg Hx     Social History   Tobacco Use  . Smoking status: Former Smoker    Packs/day: 0.50    Years: 40.00    Pack years: 20.00    Types: Cigarettes    Quit date: 02/13/2015    Years since quitting: 4.6  . Smokeless tobacco: Never Used  Vaping Use  . Vaping Use: Never used  Substance Use Topics  . Alcohol use: No    Alcohol/week: 0.0 standard drinks  . Drug use: No    Home Medications Prior to Admission medications   Medication Sig Start Date End Date Taking? Authorizing Provider  atenolol-chlorthalidone (TENORETIC) 50-25 MG tablet Take 1 tablet by mouth at bedtime.  12/03/18   [provider]  ciclopirox (PENLAC) 8 % solution Apply topically at bedtime. Apply over nail and surrounding skin. Apply daily over previous coat. After seven (7) days, may remove with alcohol and continue cycle.    [provider]  ciprofloxacin (CIPRO) 250 MG tablet Take 1 tablet (250 mg total) by mouth every 12 (twelve) hours. 10/04/19   Suzy Bouchard, PA-C  diclofenac Sodium (VOLTAREN) 1 % GEL Apply 2 g topically 4 (four) times daily as needed (pain).    [provider]  escitalopram (LEXAPRO) 5 MG tablet Take 5 mg by mouth daily.    [provider]  FREESTYLE LITE test strip  09/02/19   [provider]  gabapentin (NEURONTIN) 400 MG capsule Take 400 mg by mouth at bedtime.     [provider]  hydroquinone 4 % cream Apply topically 2 (two) times daily.    [provider]  ibuprofen (ADVIL) 600 MG tablet  05/26/19   [provider]   Incontinence Supply Disposable (FITTED BRIEFS MAXIMUM XL) MISC Use briefs as needed daily for incontinence 06/05/19   Irine Seal, MD  Lancets (  FREESTYLE) lancets  09/02/19   [provider]  LANTUS SOLOSTAR 100 UNIT/ML Solostar Pen Inject 2.5 Units into the skin daily.  11/28/17   [provider]  Rolan Lipa 72 MCG capsule  05/21/19   [provider]  metFORMIN (GLUCOPHAGE) 500 MG tablet metformin 500 mg tablet 07/17/19 04/11/20  [provider]  metoCLOPramide (REGLAN) 5 MG tablet Take 5 mg by mouth in the morning, at noon, and at bedtime.    [provider]  mupirocin cream (BACTROBAN) 2 % Apply 1 application topically 2 (two) times daily as needed (wound care).    [provider]  mupirocin ointment (BACTROBAN) 2 % Apply topically. 09/10/19   [provider]  nystatin ointment (MYCOSTATIN)  09/03/19   [provider]  nystatin-triamcinolone ointment (MYCOLOG) Apply 1 application topically 2 (two) times daily. To groins 09/11/19   Irine Seal, MD  oxybutynin (DITROPAN) 5 MG tablet Take 1 tablet (5 mg total) by mouth 3 (three) times daily. 08/21/19   Irine Seal, MD  Oxycodone HCl 20 MG TABS Take 5 mg by mouth in the morning, at noon, in the evening, and at bedtime.  02/02/18   [provider]  pantoprazole (PROTONIX) 40 MG tablet Take 1 tablet (40 mg total) by mouth 2 (two) times daily before a meal. 05/29/19   Rehman, Mechele Dawley, MD  potassium chloride (KLOR-CON) 10 MEQ tablet Take 10 mEq by mouth daily.    [provider]  predniSONE (DELTASONE) 20 MG tablet Take 20 mg by mouth at bedtime.     [provider]  psyllium (METAMUCIL SMOOTH TEXTURE) 58.6 % powder Take 1 packet by mouth daily. Patient taking differently: Take 1 packet by mouth every other day.  04/27/19   Rogene Houston, MD  sildenafil (VIAGRA) 100 MG tablet Take 100 mg by mouth daily as needed for erectile dysfunction.     [provider]   sulfamethoxazole-trimethoprim (BACTRIM DS) 800-160 MG tablet Take 1 tablet by mouth 2 (two) times daily. 09/21/19   Irine Seal, MD  terbinafine (LAMISIL) 250 MG tablet Take 250 mg by mouth daily. 09/03/19   [provider]  timolol (TIMOPTIC) 0.5 % ophthalmic solution Place 1 drop into both eyes 2 (two) times daily as needed (eye pressure).    [provider]    Allergies    Ampicillin, Ciprofloxacin, and Levaquin [levofloxacin]  Review of Systems   Review of Systems  Constitutional: Negative for chills and fever.  Gastrointestinal: Negative for abdominal pain, diarrhea, nausea and vomiting.  Genitourinary: Positive for dysuria, flank pain and frequency. Negative for hematuria.  Musculoskeletal: Positive for back pain.  All other systems reviewed and are negative.   Physical Exam Updated Vital Signs BP 131/77   Pulse 70   Temp 98.3 F (36.8 C) (Oral)   Resp 18   Ht 6\' 8"  (2.032 m)   Wt 113.9 kg   SpO2 100%   BMI 27.57 kg/m   Physical Exam Vitals and nursing note reviewed.  Constitutional:      General: He is not in acute distress.    Appearance: He is not ill-appearing.  HENT:     Head: Normocephalic.  Eyes:     Pupils: Pupils are equal, round, and reactive to light.  Cardiovascular:     Rate and Rhythm: Normal rate and regular rhythm.     Pulses: Normal pulses.     Heart sounds: Normal heart sounds. No murmur heard.  No friction rub. No gallop.  Pulmonary:     Effort: Pulmonary effort is normal.     Breath sounds: Normal breath sounds.  Abdominal:     General: Abdomen is flat. Bowel sounds are normal. There is no distension.     Palpations: Abdomen is soft.     Tenderness: There is no abdominal tenderness. There is right CVA tenderness and left CVA tenderness. There is no guarding or rebound.     Comments: Positive bilateral CVA tenderness.  Musculoskeletal:     Cervical back: Neck supple.     Comments: Able to move all 4 extremities without  difficulty.  Skin:    General: Skin is warm and dry.  Neurological:     General: No focal deficit present.     Mental Status: He is alert.  Psychiatric:        Mood and Affect: Mood normal.        Behavior: Behavior normal.     ED Results / Procedures / Treatments   Labs (all labs ordered are listed, but only abnormal results are displayed) Labs Reviewed  URINALYSIS, ROUTINE W REFLEX MICROSCOPIC - Abnormal; Notable for the following components:      Result Value   Protein, ur 30 (*)    Leukocytes,Ua MODERATE (*)    WBC, UA >50 (*)    Bacteria, UA MANY (*)    All other components within normal limits  CBC WITH DIFFERENTIAL/PLATELET - Abnormal; Notable for the following components:   RBC 3.82 (*)    Hemoglobin 11.6 (*)    HCT 35.5 (*)    All other components within normal limits  BASIC METABOLIC PANEL - Abnormal; Notable for the following components:   Glucose, Bld 102 (*)    Creatinine, Ser 1.26 (*)    GFR calc non Af Amer 55 (*)    All other components within normal limits  URINE CULTURE    EKG None  Radiology CT Renal Stone Study  Result Date: 10/04/2019 CLINICAL DATA:  Left flank pain, frequent urination, prostate cancer EXAM: CT ABDOMEN AND PELVIS WITHOUT CONTRAST TECHNIQUE: Multidetector CT imaging of the abdomen and pelvis was performed following the standard protocol without IV contrast. COMPARISON:  02/21/2019 FINDINGS: Lower chest: No acute pleural or parenchymal lung disease. Hepatobiliary: No focal liver abnormality is seen. Status post cholecystectomy. No biliary dilatation. Pancreas: Unremarkable. No pancreatic ductal dilatation or surrounding inflammatory changes. Spleen: Normal in size without focal abnormality. Adrenals/Urinary Tract: There are stable bilateral renal cortical hypodensities compatible with cysts. There is a stable 3 mm nonobstructing calculus lower pole left kidney reference image 37. No other urinary tract calculi or obstructive uropathy.  Bladder is moderately distended without focal abnormality. The adrenals are unremarkable. Stomach/Bowel: No bowel obstruction or ileus. Normal appendix right lower quadrant. No bowel wall thickening or inflammatory change. Vascular/Lymphatic: Aortic atherosclerosis. No enlarged abdominal or pelvic lymph nodes. Stable mild dilation of the infrarenal abdominal aorta measuring 3.5 cm. Reproductive: Prostate is not enlarged. Fiduciary markers are identified. Postsurgical changes from TURP. Other: No abdominal wall hernia or abnormality. No abdominopelvic ascites. Musculoskeletal: No acute or destructive bony lesions. Reconstructed images demonstrate no additional findings. IMPRESSION: 1. Stable 3 mm nonobstructing left renal calculus. 2. Stable mild dilation of the infrarenal abdominal aorta measuring 3.5 cm. Recommend followup by ultrasound in 2 years. This recommendation follows ACR consensus guidelines: White Paper of the ACR Incidental Findings Committee II on Vascular Findings. J Am Coll Radiol 2013; 10:789-794. Aortic aneurysm NOS (ICD10-I71.9) 3. Aortic Atherosclerosis (ICD10-I70.0). Electronically Signed  By: Randa Ngo M.D.   On: 10/04/2019 15:24    Procedures Procedures (including critical care time)  Medications Ordered in ED Medications  lidocaine (LIDODERM) 5 % 1 patch (1 patch Transdermal Patch Applied 10/04/19 1518)    ED Course  I have reviewed the triage vital signs and the nursing notes.  Pertinent labs & imaging results that were available during my care of the patient were reviewed by me and considered in my medical decision making (see chart for details).  Clinical Course as of Oct 04 1619  Sun Oct 04, 2019  1343 WBC: 5.0 [CA]  1525 Chalmers Guest): MODERATE [CA]  1555 Bacteria, UA(!): MANY [CA]    Clinical Course User Index [CA] Suzy Bouchard, PA-C   MDM Rules/Calculators/A&P                         79 year old male presents to the ED due to dysuria and urinary  frequency associate with bilateral low back and flank pain.  Patient has a history of kidney stones and frequent UTIs.  He follows Dr. Jeffie Pollock with [facility].  Denies fever and chills.  Upon arrival, vitals all within normal limits.  Patient nontoxic-appearing.  No concern for urosepsis at this time.  Physical exam significant for bilateral CVA tenderness.  Abdomen soft, nondistended, nontender.  No suprapubic distention or tenderness.  Suspect symptoms related to acute cystitis vs. Early pyelonephritis. Will obtain renal study to rule out infected kidney stone given patient's history. Discussed case with Dr. Wilson Singer who evaluated patient at bedside and agrees with assessment and plan.   CBC reassuring with no leukocytosis.  Mild anemia with hemoglobin 11.6 which appears to be around patient's baseline.  BMP significant for mild elevation creatinine 1.26, but otherwise reassuring.  UA significant for moderate leukocytes and many bacteria.  Suspect acute cystitis.  CT personally reviewed which demonstrates: IMPRESSION:  1. Stable 3 mm nonobstructing left renal calculus.  2. Stable mild dilation of the infrarenal abdominal aorta measuring  3.5 cm. Recommend followup by ultrasound in 2 years. This  recommendation follows ACR consensus guidelines: White Paper of the  ACR Incidental Findings Committee II on Vascular Findings. J Am Coll  Radiol 2013; 10:789-794. Aortic aneurysm NOS (ICD10-I71.9)  3. Aortic Atherosclerosis (ICD10-I70.0).   No concern for infected ureteral stone. Reviewed urine culture from 09/20/19 which demonstrates:  Morganella morganii    MIC    AMPICILLIN >=32 RESIST... Resistant    AMPICILLIN/SULBACTAM >=32 RESIST... Resistant    CEFAZOLIN >=64 RESIST... Resistant    CIPROFLOXACIN <=0.25 SENS... Sensitive    GENTAMICIN <=1 SENSITIVE  Sensitive    IMIPENEM 1 SENSITIVE  Sensitive    NITROFURANTOIN 128 RESISTANT  Resistant    PIP/TAZO <=4 SENSITIVE  Sensitive    TRIMETH/SULFA  <=20 SENSIT... Sensitive          Shared decision making in regards to giving ciprofloxacin due to it being on patient's allergy list. Patient notes in the past the antibiotic has just not worked, but no actual reaction. Patient is agreeable to try ciprofloxacin. Instructed patient to call Dr. Ralene Muskrat office to inquire about his UTI and see if they would like to change the antibiotics or increase the dosage. Patient agreeable to plan. Strict ED precautions discussed with patient. Patient states understanding and agrees to plan. Patient discharged home in no acute distress and stable vitals.  Final Clinical Impression(s) / ED Diagnoses Final diagnoses:  Acute cystitis without hematuria  Rx / DC Orders ED Discharge Orders         Ordered    ciprofloxacin (CIPRO) 250 MG tablet  Every 12 hours     Discontinue  Reprint     10/04/19 1620           Karie Kirks 10/04/19 1627    Virgel Manifold, MD 10/11/19 1129

## 2019-10-05 ENCOUNTER — Telehealth: Payer: Self-pay | Admitting: Urology

## 2019-10-05 LAB — URINE CULTURE: Culture: <10000 — AB

## 2019-10-05 NOTE — Telephone Encounter (Signed)
Pt was seen in the ER, he would like to a call back to discuss his infection and questions regarding the medication he received.

## 2019-10-06 NOTE — Telephone Encounter (Signed)
Pt called, informed to complete the abt given to him by the hospital and to call office back if symptoms persist or gets worse.

## 2019-10-08 ENCOUNTER — Telehealth: Payer: Self-pay

## 2019-10-08 NOTE — Telephone Encounter (Signed)
-----   Message from Irine Seal, MD sent at 10/07/2019  8:14 AM EDT ----- Jonathan [name] was in the ED on 7/11 for a possible UTI but his UA was negative.   He no showed his 7/12 appointment for urodynamics and this might have been the reason, but he didn't call to notifiy anyone.  We will try to reschedule one more time but if he isn't going to come, he needs to call asap.

## 2019-10-09 NOTE — Telephone Encounter (Signed)
Unfortunately, no one asked me about whether he could go ahead or not.  I would have told them to proceed since his culture was negative.  I will check and see who made that call.

## 2019-10-12 ENCOUNTER — Ambulatory Visit: Payer: Self-pay

## 2019-10-12 ENCOUNTER — Ambulatory Visit (INDEPENDENT_AMBULATORY_CARE_PROVIDER_SITE_OTHER): Payer: Medicare Other

## 2019-10-12 ENCOUNTER — Other Ambulatory Visit: Payer: Self-pay

## 2019-10-12 ENCOUNTER — Encounter: Payer: Self-pay | Admitting: Orthopedic Surgery

## 2019-10-12 ENCOUNTER — Ambulatory Visit (INDEPENDENT_AMBULATORY_CARE_PROVIDER_SITE_OTHER): Payer: Medicare Other | Admitting: Physician Assistant

## 2019-10-12 VITALS — Ht >= 80 in | Wt 251.0 lb

## 2019-10-12 DIAGNOSIS — G8929 Other chronic pain: Secondary | ICD-10-CM

## 2019-10-12 DIAGNOSIS — M25571 Pain in right ankle and joints of right foot: Secondary | ICD-10-CM

## 2019-10-12 DIAGNOSIS — M25572 Pain in left ankle and joints of left foot: Secondary | ICD-10-CM

## 2019-10-12 NOTE — Progress Notes (Signed)
Office Visit Note   Patient: Jonathan [name]           Date of Birth: 09/20/1940           MRN: [id number] Visit Date: 10/12/2019              Requested by: [name], PA-C [place],  [place] [address] PCP: [name], PA-C  Chief Complaint  Patient presents with  . Left Ankle - Pain  . Right Ankle - Pain      HPI: This is a pleasant gentleman who is here in follow-up today for his bilateral ankles.  He is status post right ankle replacement.  At his last visit approximately 6 weeks ago he was evaluated by Dr. Sharol Given for bilateral ankle pain and left ankle   instability.  He had been using an [place] brace on the right ankle but it was rubbing on his fibula and was quite painful.  He also states it has significantly affected his balance and he has a history of falling several times secondary to balance issues including neuropathy.  He felt unsafe in the [place] brace on the right.  His exam demonstrated significant ligamentous laxity of the left ankle.  He was given a prescription for bilateral stirrup braces.  These have helped him significantly.  The left ankle significantly deteriorated as he was being followed had surgery and recovered from total ankle replacement on the right which was service related.  He comes in today asking for [employer] so that the brace for the left side can be covered.  They will only provide him with an [place] brace which is a much more costly brace and he cannot use secondary to its effects on his balance and he is concerned for future falling.  He would like to have the stirrup brace on the left covered  Assessment & Plan: Visit Diagnoses:  1. Chronic pain of both ankles     Plan: We would support him in coverage of the left stirrup brace.  His left ankle started deteriorating when he was having to rely on it when his right ankle was arthritic and needed surgery secondary to a service related injury.  The stirrup brace serves him well and allows  him to wear regular shoes and provides the stability he needs  And can continue with his activities of daily living without concerns for further injury and falls  Follow-Up Instructions: No follow-ups on file.   Ortho Exam  Patient is alert, oriented, no adenopathy, well-dressed, normal affect, normal respiratory effort. Left ankle has laxity of the lateral ligaments no pain on passive range of motion.  It has increased anterior draw  Imaging: No results found. No images are attached to the encounter.  Labs: Lab Results  Component Value Date   HGBA1C 5.9 (H) 01/30/2019   HGBA1C 6.3 (H) 06/13/2015   HGBA1C 6.6 (H) 04/18/2015   REPTSTATUS 10/05/2019 FINAL 10/04/2019   GRAMSTAIN  06/17/2015    NO WBC SEEN NO SQUAMOUS EPITHELIAL CELLS SEEN NO ORGANISMS SEEN Performed at [facility] (A) 10/04/2019    <10,000 COLONIES/mL INSIGNIFICANT GROWTH Performed at [facility] [address] [place], [place] [address]    LABORGA MORGANELLA MORGANII (A) 09/18/2019     Lab Results  Component Value Date   ALBUMIN 3.4 (L) 12/21/2017   ALBUMIN 3.8 12/10/2017   ALBUMIN 3.6 10/25/2016    No results found for: MG No results  found for: VD25OH  No results found for: PREALBUMIN CBC EXTENDED Latest Ref Rng & Units 10/04/2019 01/30/2019 08/02/2018  WBC 4.0 - 10.5 K/uL 5.0 5.0 4.7  RBC 4.22 - 5.81 MIL/uL 3.82(L) 4.02(L) 3.78(L)  HGB 13.0 - 17.0 g/dL 11.6(L) 11.9(L) 11.7(L)  HCT 39 - 52 % 35.5(L) 37.4(L) 35.3(L)  PLT 150 - 400 K/uL 188 181 165  NEUTROABS 1.7 - 7.7 K/uL 3.2 - 2.9  LYMPHSABS 0.7 - 4.0 K/uL 1.3 - 1.3     Body mass index is 27.57 kg/m.  Orders:  Orders Placed This Encounter  Procedures  . XR Ankle Complete Left  . XR Ankle Complete Right   No orders of the defined types were placed in this encounter.    Procedures: No procedures performed  Clinical Data: No additional findings.  ROS:  All other systems negative, except as noted in the  HPI. Review of Systems  Objective: Vital Signs: Ht 6\' 8"  (2.032 m)   Wt 251 lb (113.9 kg)   BMI 27.57 kg/m   Specialty Comments:  No specialty comments available.  PMFS History: Patient Active Problem List   Diagnosis Date Noted  . History of peptic ulcer disease   . History of prostate cancer 04/10/2019  . Urge incontinence 04/10/2019  . Urinary retention 04/18/2018  . Malignant neoplasm of prostate ([place]) 07/10/2017  . Arthritis of right subtalar joint 04/18/2017  . 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 Idiopathic chronic venous hypertension of both lower extremities with inflammation 05/22/2016  . Fibrosis of subtalar joint, right 03/08/2016  . Primary malignant neoplasm of left upper lobe of lung ([place]) 09/21/2015  . Lung cancer ([place]) 07/04/2015  . Diabetes mellitus type II, controlled ([place]) 06/01/2015  . 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 Emphysema of lung ([place])   . Kidney stones   . Osteoporosis   . H/O total ankle replacement, right 04/20/2015  . Carpal tunnel syndrome - right 07/09/2014  . Bloating 06/14/2014  . History of colonic polyps 06/14/2014  . Diabetic polyneuropathy associated with type 2 diabetes mellitus ([place]) 12/05/2012  . Lesion of ulnar nerve 12/05/2012  . Syncope 11/15/2012  . Gout 11/15/2012  . Chronic constipation 04/02/2009  . CYSTITIS, RECURRENT 12/02/2008  . COPD 10/26/2008  . DIVERTICULUM, BLADDER 10/11/2008  . INSOMNIA 07/24/2008  . Acute cystitis without hematuria 07/20/2008  . FATIGUE 07/20/2008  . DEPRESSION 02/27/2008  . CHEST PAIN UNSPECIFIED 02/27/2008  . NICOTINE ADDICTION 08/12/2007  . DYSLIPIDEMIA 07/22/2007  . OBESITY 07/22/2007  . ERECTILE DYSFUNCTION 07/22/2007  . HYPERTENSION 07/22/2007   Past Medical History:  Diagnosis Date  . Acquired bladder diverticulum    12/ 2012  resection  diverticulum done at [facility] in [place], [place]  . Age-related cataract of right eye    scheduled for catarat extraction 09-12-2017  . Agent orange exposure   . Aneurysm of infrarenal abdominal aorta ([place])    first dx 2017--- last CT 06-11-2017  measures 3.5cm  . Chronic constipation   . COPD with emphysema ([place])    06--12-2017  per pt no symptoms since Feb 2019  . Depression    PTSD  . Diabetes mellitus type 2, diet-controlled ([place])   . Diabetic neuropathy ([place])   . Dyslipidemia   . Dyspnea on exertion   . Erectile dysfunction   . Feeling of incomplete bladder emptying   .  GERD (gastroesophageal reflux disease)   . Gout    09-02-2017 last flare up 3 months ago  . Hiatal hernia   . History of atrial fibrillation    episode post op right lung lobectomy 07-04-2015  . History of bladder stone   . History of colon polyps   . History of DVT of lower extremity    03/ 2019  bilateral lower extremity (superficial) ---  per treated w/ oral medication   . History of gastritis   . History of kidney stones   . History of radiation therapy 09-14-2015  to 09-21-2015   left upper lung nodule -- 54Gy in 3 fractions (18 Gy per fraction)  . Hyperplasia of prostate with lower urinary tract symptoms (LUTS)   . Hypertension   . Lumbar spinal stenosis   . Nephrolithiasis   . Neurogenic bladder   . Osteoarthritis    ankle, hands  . Osteoporosis   . Prostate cancer [facility]) urologist-  dr wrenn/  oncologist-  dr Tammi Klippel   dx 05-24-2017-- Stage T2b,  Gleason 4+4,  PSA 22.41,  vol 38cc--- started ADT 04/ 2019,  plan external radiation therapy  . Radiation fibrosis of lung ([place])    hx left upper long nodule SBRT 06/ 2017  . Squamous cell carcinoma of both lungs ([place]) last CT in epic dated 06-26-2017 no recurrence   dx 03/ 2017  non-small cell SCC via bronchoscopy w/ bx's by dr Roxan Hockey---  s/p  right VATS w/ right lobectomy and node dissection's 07-04-2015 /  pt had SBRT to left upper nodule Stage 1 ([facility]) completed 09-21-2015  . Wears dentures    bottom  . Wears glasses     Family History  Problem Relation Age of Onset  . Cancer Father        Asbestos  . Colon cancer Neg Hx   . Colon polyps Neg Hx   . Kidney disease Neg Hx   . Esophageal cancer Neg Hx   . Gallbladder disease Neg Hx   . Heart disease Neg Hx   . Diabetes Neg Hx     Past Surgical History:  Procedure Laterality Date  . BIOPSY  05/29/2019   Procedure: BIOPSY;  Surgeon: Rogene Houston, MD;  [facility];  Service: Endoscopy;;  gastric ulcer  . CARPAL TUNNEL RELEASE Right 07/09/2014   Procedure: RIGHT OPEN CARPAL TUNNEL RELEASE;  Surgeon: Mcarthur Rossetti, MD;  [facility];  Service: Orthopedics;  Laterality: Right;  . CHOLECYSTECTOMY N/A 02/24/2014   Procedure: LAPAROSCOPIC CHOLECYSTECTOMY;  Surgeon: Coralie Keens, MD;  Location: [facility];  Service: General;  Laterality: N/A;  . COLONOSCOPY    . CYSTOLITHOT[facility]Y  [facility] [place]  . CYSTOSCOPY N/A 04/18/2018   Procedure: CYSTOSCOPY FLEXIBLE;  Surgeon: Irine Seal, MD;  Location: [facility];  Service: Urology;  Laterality: N/A;  . CYSTOSCOPY W/ LITHOLAPAXY / EHL  02-24-2007   dr Janice Norrie  [facility]  . dental implant     No teeth at this time waiting for them to be made as of 01-30-19  . ESOPHAGOGASTRODUODENOSCOPY (EGD) WITH PROPOFOL N/A 05/29/2019   Procedure: ESOPHAGOGASTRODUODENOSCOPY (EGD) WITH PROPOFOL;  Surgeon: Rogene Houston, MD;  [facility];  Service: Endoscopy;  Laterality: N/A;  925  . GOLD SEED IMPLANT N/A 09/05/2017   Procedure: GOLD SEED IMPLANT;  Surgeon: Irine Seal, MD;  [facility];  Service: Urology;  Laterality:  N/A;  . INSERTION OF SUPRAPUBIC CATHETER N/A 04/18/2018   Procedure: SUPRAPUBIC TUBE CHANGE;  Surgeon: Irine Seal, MD;  [facility];  Service: Urology;  Laterality: N/A;  . IR CATHETER TUBE CHANGE  02/24/2018  . SPACE OAR INSTILLATION N/A 09/05/2017    Procedure: SPACE OAR INSTILLATION;  Surgeon: Irine Seal, MD;  Location: [facility];  Service: Urology;  Laterality: N/A;  . TOTAL ANKLE ARTHROPLASTY Right 04/20/2015   Procedure: TOTAL ANKLE ARTHOPLASTY;  Surgeon: Newt Minion, MD;  Location: [facility];  Service: Orthopedics;  Laterality: Right;  . TRANSTHORACIC ECHOCARDIOGRAM  11/16/2012   ef 55-60%,  grade 1 diastolic dysfunction/  mild dilated ascending aorta/  mild MR/ trivial TR  . TRANSURETHRAL RESECTION OF PROSTATE N/A 02/03/2019   Procedure: TRANSURETHRAL RESECTION OF THE PROSTATE (TURP);  Surgeon: Irine Seal, MD;  [facility];  Service: Urology;  Laterality: N/A;  . VIDEO ASSISTED THORACOSCOPY (VATS)/ LOBECTOMY Right 07/04/2015   Procedure: VIDEO ASSISTED THORACOSCOPY (VATS)/ RIGHT UPPER LOBECTOMY;  Surgeon: Melrose Nakayama, MD;  Location: [facility];  Service: Thoracic;  Laterality: Right;  [name] VIDEO BRONCHOSCOPY N/A 06/17/2015   Procedure: VIDEO BRONCHOSCOPY;  Surgeon: Melrose Nakayama, MD;  Location: [facility];  Service: Thoracic;  Laterality: N/A;  . VIDEO BRONCHOSCOPY WITH ENDOBRONCHIAL NAVIGATION N/A 06/17/2015   Procedure: VIDEO BRONCHOSCOPY WITH ENDOBRONCHIAL NAVIGATION;  Surgeon: Melrose Nakayama, MD;  Location: [facility];  Service: Thoracic;  Laterality: N/A;  . VIDEO BRONCHOSCOPY WITH ENDOBRONCHIAL ULTRASOUND N/A 06/17/2015   Procedure: VIDEO BRONCHOSCOPY WITH ENDOBRONCHIAL ULTRASOUND;  Surgeon: Melrose Nakayama, MD;  Location: [facility];  Service: Thoracic;  Laterality: N/A;   Social History   Occupational History  . Occupation: retired  Tobacco Use  . Smoking status: Former Smoker    Packs/day: 0.50    Years: 40.00    Pack years: 20.00    Types: Cigarettes    Quit date: 02/13/2015    Years since quitting: 4.6  . Smokeless tobacco: Never Used  Vaping Use  . Vaping Use: Never used  Substance and Sexual Activity  . Alcohol use: No    Alcohol/week: 0.0 standard drinks  . Drug use: No  . Sexual  activity: Not Currently

## 2019-10-13 DIAGNOSIS — E1143 Type 2 diabetes mellitus with diabetic autonomic (poly)neuropathy: Secondary | ICD-10-CM | POA: Diagnosis not present

## 2019-10-13 DIAGNOSIS — C61 Malignant neoplasm of prostate: Secondary | ICD-10-CM | POA: Diagnosis not present

## 2019-10-13 DIAGNOSIS — R42 Dizziness and giddiness: Secondary | ICD-10-CM | POA: Diagnosis not present

## 2019-10-13 DIAGNOSIS — I10 Essential (primary) hypertension: Secondary | ICD-10-CM | POA: Diagnosis not present

## 2019-10-13 DIAGNOSIS — K5901 Slow transit constipation: Secondary | ICD-10-CM | POA: Diagnosis not present

## 2019-10-13 DIAGNOSIS — F411 Generalized anxiety disorder: Secondary | ICD-10-CM | POA: Diagnosis not present

## 2019-10-21 ENCOUNTER — Ambulatory Visit (INDEPENDENT_AMBULATORY_CARE_PROVIDER_SITE_OTHER): Payer: Medicare Other | Admitting: Gastroenterology

## 2019-10-26 ENCOUNTER — Other Ambulatory Visit: Payer: Self-pay

## 2019-10-26 ENCOUNTER — Other Ambulatory Visit: Payer: Medicare Other

## 2019-10-26 ENCOUNTER — Telehealth: Payer: Self-pay

## 2019-10-26 DIAGNOSIS — N390 Urinary tract infection, site not specified: Secondary | ICD-10-CM

## 2019-10-26 LAB — URINALYSIS, COMPLETE
Bilirubin, UA: NEGATIVE
Glucose, UA: NEGATIVE
Ketones, UA: NEGATIVE
Nitrite, UA: NEGATIVE
Specific Gravity, UA: >1.03 — ABNORMAL HIGH (ref 1.005–1.030)
Urobilinogen, Ur: 0.2 mg/dL (ref 0.2–1.0)
pH, UA: 5.5 (ref 5.0–7.5)

## 2019-10-26 LAB — MICROSCOPIC EXAMINATION
Epithelial Cells (non renal): NONE SEEN /hpf (ref 0–10)
Renal Epithel, UA: NONE SEEN /hpf
WBC, UA: >30 /hpf — AB (ref 0–5)

## 2019-10-26 NOTE — Telephone Encounter (Signed)
-----   Message from Irine Seal, MD sent at 10/23/2019 11:43 AM EDT ----- Could you please reach out to the [facility] office to get this scheduled.  ----- Message ----- From: Iris Pert, LPN Sent: 2/37/0230  10:45 AM EDT To: Irine Seal, MD  Pt came by office today and said he had call [facility]  weeks ago to schedule urodynamics test. Whoever he spoke with told him they would call him back after talking with you but he has not heard anything back. Please advise.

## 2019-10-26 NOTE — Progress Notes (Signed)
Pt came by office today complaining of burning while urinating. Urine specimen left for ua and culture per MD.

## 2019-10-26 NOTE — Telephone Encounter (Signed)
Received message pt scheduled for  11/02/19

## 2019-10-27 ENCOUNTER — Telehealth: Payer: Self-pay

## 2019-10-27 NOTE — Telephone Encounter (Signed)
Patient notified

## 2019-10-27 NOTE — Telephone Encounter (Signed)
-----   Message from Irine Seal, MD sent at 10/26/2019 11:26 AM EDT ----- He has pyuria but I would like to see a culture before treatment.  He generally has pyuria.  ----- Message ----- From: Iris Pert, LPN Sent: 12/01/9209  94:17 AM EDT To: Irine Seal, MD  Please review

## 2019-10-28 LAB — URINE CULTURE

## 2019-10-29 ENCOUNTER — Ambulatory Visit (INDEPENDENT_AMBULATORY_CARE_PROVIDER_SITE_OTHER): Payer: Medicare Other | Admitting: Gastroenterology

## 2019-11-03 DIAGNOSIS — E1122 Type 2 diabetes mellitus with diabetic chronic kidney disease: Secondary | ICD-10-CM | POA: Diagnosis not present

## 2019-11-03 DIAGNOSIS — T63441A Toxic effect of venom of bees, accidental (unintentional), initial encounter: Secondary | ICD-10-CM | POA: Diagnosis not present

## 2019-11-03 DIAGNOSIS — K219 Gastro-esophageal reflux disease without esophagitis: Secondary | ICD-10-CM | POA: Diagnosis not present

## 2019-11-03 DIAGNOSIS — J45909 Unspecified asthma, uncomplicated: Secondary | ICD-10-CM | POA: Diagnosis not present

## 2019-11-27 ENCOUNTER — Ambulatory Visit: Payer: Medicare Other | Admitting: Urology

## 2019-12-08 ENCOUNTER — Other Ambulatory Visit: Payer: Self-pay

## 2019-12-08 DIAGNOSIS — Z8546 Personal history of malignant neoplasm of prostate: Secondary | ICD-10-CM

## 2019-12-10 DIAGNOSIS — E1143 Type 2 diabetes mellitus with diabetic autonomic (poly)neuropathy: Secondary | ICD-10-CM | POA: Diagnosis not present

## 2019-12-10 DIAGNOSIS — R32 Unspecified urinary incontinence: Secondary | ICD-10-CM | POA: Diagnosis not present

## 2019-12-10 DIAGNOSIS — R300 Dysuria: Secondary | ICD-10-CM | POA: Diagnosis not present

## 2019-12-10 DIAGNOSIS — R42 Dizziness and giddiness: Secondary | ICD-10-CM | POA: Diagnosis not present

## 2019-12-10 DIAGNOSIS — I10 Essential (primary) hypertension: Secondary | ICD-10-CM | POA: Diagnosis not present

## 2019-12-10 DIAGNOSIS — K5901 Slow transit constipation: Secondary | ICD-10-CM | POA: Diagnosis not present

## 2019-12-10 DIAGNOSIS — R3914 Feeling of incomplete bladder emptying: Secondary | ICD-10-CM | POA: Diagnosis not present

## 2019-12-10 DIAGNOSIS — N3941 Urge incontinence: Secondary | ICD-10-CM | POA: Diagnosis not present

## 2019-12-10 DIAGNOSIS — R350 Frequency of micturition: Secondary | ICD-10-CM | POA: Diagnosis not present

## 2019-12-10 DIAGNOSIS — Z6828 Body mass index (BMI) 28.0-28.9, adult: Secondary | ICD-10-CM | POA: Diagnosis not present

## 2019-12-10 DIAGNOSIS — C61 Malignant neoplasm of prostate: Secondary | ICD-10-CM | POA: Diagnosis not present

## 2019-12-11 NOTE — Addendum Note (Signed)
Addended byIris Pert on: 12/11/2019 08:46 AM   Modules accepted: Orders

## 2019-12-16 DIAGNOSIS — F411 Generalized anxiety disorder: Secondary | ICD-10-CM | POA: Diagnosis not present

## 2019-12-16 DIAGNOSIS — M7138 Other bursal cyst, other site: Secondary | ICD-10-CM | POA: Diagnosis not present

## 2019-12-16 DIAGNOSIS — K219 Gastro-esophageal reflux disease without esophagitis: Secondary | ICD-10-CM | POA: Diagnosis not present

## 2019-12-16 DIAGNOSIS — M25571 Pain in right ankle and joints of right foot: Secondary | ICD-10-CM | POA: Diagnosis not present

## 2019-12-16 DIAGNOSIS — K5901 Slow transit constipation: Secondary | ICD-10-CM | POA: Diagnosis not present

## 2019-12-16 DIAGNOSIS — M25572 Pain in left ankle and joints of left foot: Secondary | ICD-10-CM | POA: Diagnosis not present

## 2019-12-16 DIAGNOSIS — M810 Age-related osteoporosis without current pathological fracture: Secondary | ICD-10-CM | POA: Diagnosis not present

## 2019-12-16 DIAGNOSIS — Z87440 Personal history of urinary (tract) infections: Secondary | ICD-10-CM | POA: Diagnosis not present

## 2019-12-16 DIAGNOSIS — J449 Chronic obstructive pulmonary disease, unspecified: Secondary | ICD-10-CM | POA: Diagnosis not present

## 2019-12-16 DIAGNOSIS — C61 Malignant neoplasm of prostate: Secondary | ICD-10-CM | POA: Diagnosis not present

## 2019-12-16 DIAGNOSIS — E1143 Type 2 diabetes mellitus with diabetic autonomic (poly)neuropathy: Secondary | ICD-10-CM | POA: Diagnosis not present

## 2019-12-16 DIAGNOSIS — E1142 Type 2 diabetes mellitus with diabetic polyneuropathy: Secondary | ICD-10-CM | POA: Diagnosis not present

## 2019-12-16 DIAGNOSIS — R109 Unspecified abdominal pain: Secondary | ICD-10-CM | POA: Diagnosis not present

## 2019-12-16 DIAGNOSIS — R350 Frequency of micturition: Secondary | ICD-10-CM | POA: Diagnosis not present

## 2019-12-16 DIAGNOSIS — I10 Essential (primary) hypertension: Secondary | ICD-10-CM | POA: Diagnosis not present

## 2019-12-16 DIAGNOSIS — Z9181 History of falling: Secondary | ICD-10-CM | POA: Diagnosis not present

## 2019-12-16 DIAGNOSIS — Z85118 Personal history of other malignant neoplasm of bronchus and lung: Secondary | ICD-10-CM | POA: Diagnosis not present

## 2019-12-16 DIAGNOSIS — R42 Dizziness and giddiness: Secondary | ICD-10-CM | POA: Diagnosis not present

## 2019-12-16 DIAGNOSIS — Z902 Acquired absence of lung [part of]: Secondary | ICD-10-CM | POA: Diagnosis not present

## 2019-12-16 DIAGNOSIS — R32 Unspecified urinary incontinence: Secondary | ICD-10-CM | POA: Diagnosis not present

## 2019-12-16 DIAGNOSIS — Z86718 Personal history of other venous thrombosis and embolism: Secondary | ICD-10-CM | POA: Diagnosis not present

## 2019-12-16 DIAGNOSIS — M24272 Disorder of ligament, left ankle: Secondary | ICD-10-CM | POA: Diagnosis not present

## 2019-12-21 DIAGNOSIS — J449 Chronic obstructive pulmonary disease, unspecified: Secondary | ICD-10-CM | POA: Diagnosis not present

## 2019-12-21 DIAGNOSIS — C61 Malignant neoplasm of prostate: Secondary | ICD-10-CM | POA: Diagnosis not present

## 2019-12-21 DIAGNOSIS — E1143 Type 2 diabetes mellitus with diabetic autonomic (poly)neuropathy: Secondary | ICD-10-CM | POA: Diagnosis not present

## 2019-12-21 DIAGNOSIS — I10 Essential (primary) hypertension: Secondary | ICD-10-CM | POA: Diagnosis not present

## 2019-12-21 DIAGNOSIS — E1142 Type 2 diabetes mellitus with diabetic polyneuropathy: Secondary | ICD-10-CM | POA: Diagnosis not present

## 2019-12-21 DIAGNOSIS — F411 Generalized anxiety disorder: Secondary | ICD-10-CM | POA: Diagnosis not present

## 2019-12-24 DIAGNOSIS — E1142 Type 2 diabetes mellitus with diabetic polyneuropathy: Secondary | ICD-10-CM | POA: Diagnosis not present

## 2019-12-24 DIAGNOSIS — I10 Essential (primary) hypertension: Secondary | ICD-10-CM | POA: Diagnosis not present

## 2019-12-24 DIAGNOSIS — E1143 Type 2 diabetes mellitus with diabetic autonomic (poly)neuropathy: Secondary | ICD-10-CM | POA: Diagnosis not present

## 2019-12-24 DIAGNOSIS — J449 Chronic obstructive pulmonary disease, unspecified: Secondary | ICD-10-CM | POA: Diagnosis not present

## 2019-12-24 DIAGNOSIS — F411 Generalized anxiety disorder: Secondary | ICD-10-CM | POA: Diagnosis not present

## 2019-12-24 DIAGNOSIS — C61 Malignant neoplasm of prostate: Secondary | ICD-10-CM | POA: Diagnosis not present

## 2019-12-25 ENCOUNTER — Encounter: Payer: Self-pay | Admitting: Urology

## 2019-12-25 ENCOUNTER — Other Ambulatory Visit: Payer: Self-pay

## 2019-12-25 ENCOUNTER — Ambulatory Visit (INDEPENDENT_AMBULATORY_CARE_PROVIDER_SITE_OTHER): Payer: Medicare Other | Admitting: Urology

## 2019-12-25 VITALS — BP 130/79 | HR 72 | Temp 98.6°F | Ht >= 80 in | Wt 251.0 lb

## 2019-12-25 DIAGNOSIS — R339 Retention of urine, unspecified: Secondary | ICD-10-CM | POA: Diagnosis not present

## 2019-12-25 DIAGNOSIS — Z8546 Personal history of malignant neoplasm of prostate: Secondary | ICD-10-CM | POA: Diagnosis not present

## 2019-12-25 DIAGNOSIS — R3912 Poor urinary stream: Secondary | ICD-10-CM | POA: Diagnosis not present

## 2019-12-25 DIAGNOSIS — N390 Urinary tract infection, site not specified: Secondary | ICD-10-CM | POA: Diagnosis not present

## 2019-12-25 DIAGNOSIS — N3941 Urge incontinence: Secondary | ICD-10-CM

## 2019-12-25 DIAGNOSIS — N5201 Erectile dysfunction due to arterial insufficiency: Secondary | ICD-10-CM

## 2019-12-25 LAB — URINALYSIS, ROUTINE W REFLEX MICROSCOPIC
Bilirubin, UA: NEGATIVE
Glucose, UA: NEGATIVE
Ketones, UA: NEGATIVE
Nitrite, UA: NEGATIVE
Specific Gravity, UA: 1.025 (ref 1.005–1.030)
Urobilinogen, Ur: 0.2 mg/dL (ref 0.2–1.0)
pH, UA: 5.5 (ref 5.0–7.5)

## 2019-12-25 LAB — MICROSCOPIC EXAMINATION
Epithelial Cells (non renal): NONE SEEN /hpf (ref 0–10)
Renal Epithel, UA: NONE SEEN /hpf
WBC, UA: >30 /hpf — AB (ref 0–5)

## 2019-12-25 MED ORDER — TADALAFIL 5 MG PO TABS: 5.0000 mg | ORAL_TABLET | Freq: Every day | ORAL | 11 refills | Status: DC | PRN

## 2019-12-25 NOTE — Progress Notes (Signed)
Subjective:  1. Recurrent UTI   2. Urge incontinence   3. Erectile dysfunction due to arterial insufficiency   4. Weak urinary stream   5. Incomplete bladder emptying   6. History of prostate cancer     Jonathan [name] returns today in f/u from Urodynamics on 12/10/19 from nocturnal enuresis and daytime frequency with UUI.  He has been on oxybutynin and tamsulosin.   His UA today has WBC and RBC's.  His culture on 8/2 had Mx species.  Cipro has not helped but bactrim does help some despite the culture.      UDS Impression: Jonathan [name] held a max capacity of approx. 644 mls. His 1st sensation was felt at 246 mls. No SUI was noted during the study. There was positive low amplitude instability. He felt an increased urge but was able to inhibit his instability without leaking. He told me he leaks urine overnight when sleeping. He was able to generate a voluntary contraction and void. Increased EMG activity was noted during voiding. PVR was approx. 394 mls. Some trabeculation was noted. No reflux was seen.  He doesn't    He has a history of urinary retention following radiation therapy and required prolonged use of an SP tube followed by a TURP on 02/03/19.  He is having persistent daytime frequency but doesn't wake at night.  He has marked SUI and has to wear 2 pads at a time and has to change multiple times daily.  He has intermittency.  He has an adequate stream.   He remains on oxybutynin tid.  He has no hematuria but has some mild dysuria if he doesn't hydrate.   The urine is no longer malodorous.   His IPSS is 23/5.      He has a history of T3b N0 M0 Gleason 9 prostate cancer that was treated with EXRT and ADT.   He completed EXRT in 9/19.   His final  Lupron injection was given 1/21.  His PSA remains <0.1 on 08/21/19 and his testosterone remained low at 15 on 08/21/19.     He has been using some Sildenafil for ED with some benefit.  He would like to try a daily med.     ROS:  ROS:  A complete review  of systems was performed.  All systems are negative except for pertinent findings as noted.   ROS  Allergies  Allergen Reactions  . Ampicillin   . Ciprofloxacin Other (See Comments)    Doesn't work  . Levaquin [Levofloxacin] Other (See Comments)    Doesn't work    Outpatient Encounter Medications as of 12/25/2019  Medication Sig  . atenolol-chlorthalidone (TENORETIC) 50-25 MG tablet Take 1 tablet by mouth at bedtime.   . ciclopirox (PENLAC) 8 % solution Apply topically at bedtime. Apply over nail and surrounding skin. Apply daily over previous coat. After seven (7) days, may remove with alcohol and continue cycle.  . ciprofloxacin (CIPRO) 250 MG tablet Take 1 tablet (250 mg total) by mouth every 12 (twelve) hours.  . diclofenac Sodium (VOLTAREN) 1 % GEL Apply 2 g topically 4 (four) times daily as needed (pain).  [name] escitalopram (LEXAPRO) 5 MG tablet Take 5 mg by mouth daily.  [name] FREESTYLE LITE test strip   . gabapentin (NEURONTIN) 400 MG capsule Take 400 mg by mouth at bedtime.   . hydroquinone 4 % cream Apply topically 2 (two) times daily.  [name] ibuprofen (ADVIL) 600 MG tablet   . Incontinence Supply Disposable (FITTED BRIEFS MAXIMUM  XL) MISC Use briefs as needed daily for incontinence  . Lancets (FREESTYLE) lancets   . LANTUS SOLOSTAR 100 UNIT/ML Solostar Pen Inject 2.5 Units into the skin daily.   [name] LINZESS 72 MCG capsule   . metFORMIN (GLUCOPHAGE) 500 MG tablet metformin 500 mg tablet  . metoCLOPramide (REGLAN) 5 MG tablet Take 5 mg by mouth in the morning, at noon, and at bedtime.  . mupirocin cream (BACTROBAN) 2 % Apply 1 application topically 2 (two) times daily as needed (wound care).  . mupirocin ointment (BACTROBAN) 2 % Apply topically.  . nystatin ointment (MYCOSTATIN)   . nystatin-triamcinolone ointment (MYCOLOG) Apply 1 application topically 2 (two) times daily. To groins  . oxybutynin (DITROPAN) 5 MG tablet Take 1 tablet (5 mg total) by mouth 3 (three) times daily.  .  Oxycodone HCl 20 MG TABS Take 5 mg by mouth in the morning, at noon, in the evening, and at bedtime.   . pantoprazole (PROTONIX) 40 MG tablet Take 1 tablet (40 mg total) by mouth 2 (two) times daily before a meal.  . potassium chloride (KLOR-CON) 10 MEQ tablet Take 10 mEq by mouth daily.  . predniSONE (DELTASONE) 20 MG tablet Take 20 mg by mouth at bedtime.   . psyllium (METAMUCIL SMOOTH TEXTURE) 58.6 % powder Take 1 packet by mouth daily. (Patient taking differently: Take 1 packet by mouth every other day. )  . sildenafil (VIAGRA) 100 MG tablet Take 100 mg by mouth daily as needed for erectile dysfunction.   . sulfamethoxazole-trimethoprim (BACTRIM DS) 800-160 MG tablet Take 1 tablet by mouth 2 (two) times daily.  [name] terbinafine (LAMISIL) 250 MG tablet Take 250 mg by mouth daily.  . timolol (TIMOPTIC) 0.5 % ophthalmic solution Place 1 drop into both eyes 2 (two) times daily as needed (eye pressure).  . tadalafil (CIALIS) 5 MG tablet Take 1 tablet (5 mg total) by mouth daily as needed for erectile dysfunction.   No facility-administered encounter medications on file as of 12/25/2019.    Past Medical History:  Diagnosis Date  . Acquired bladder diverticulum    12/ 2012  resection diverticulum done at [facility] in [place], [place]  . Age-related cataract of right eye    scheduled for catarat extraction 09-12-2017  . Agent orange exposure   . Aneurysm of infrarenal abdominal aorta ([place])    first dx 2017--- last CT 06-11-2017  measures 3.5cm  . Chronic constipation   . COPD with emphysema ([place])    06--12-2017  per pt no symptoms since Feb 2019  . Depression    PTSD  . Diabetes mellitus type 2, diet-controlled ([place])   . Diabetic neuropathy ([place])   . Dyslipidemia   . Dyspnea on exertion   . Erectile dysfunction   . Feeling of incomplete bladder emptying   . GERD (gastroesophageal reflux disease)   . Gout    09-02-2017 last flare up 3 months ago  . Hiatal hernia   . History of atrial fibrillation     episode post op right lung lobectomy 07-04-2015  . History of bladder stone   . History of colon polyps   . History of DVT of lower extremity    03/ 2019  bilateral lower extremity (superficial) ---  per treated w/ oral medication   . History of gastritis   . History of kidney stones   . History of radiation therapy 09-14-2015  to 09-21-2015   left upper lung nodule -- 54Gy in 3 fractions (18 Gy per fraction)  .  Hyperplasia of prostate with lower urinary tract symptoms (LUTS)   . Hypertension   . Lumbar spinal stenosis   . Nephrolithiasis   . Neurogenic bladder   . Osteoarthritis    ankle, hands  . Osteoporosis   . Prostate cancer [facility]) urologist-  dr Audry Kauzlarich/  oncologist-  dr Tammi Klippel   dx 05-24-2017-- Stage T2b,  Gleason 4+4,  PSA 22.41,  vol 38cc--- started ADT 04/ 2019,  plan external radiation therapy  . Radiation fibrosis of lung ([place])    hx left upper long nodule SBRT 06/ 2017  . Squamous cell carcinoma of both lungs ([place]) last CT in epic dated 06-26-2017 no recurrence   dx 03/ 2017  non-small cell SCC via bronchoscopy w/ bx's by dr Roxan Hockey---  s/p  right VATS w/ right lobectomy and node dissection's 07-04-2015 /  pt had SBRT to left upper nodule Stage 1 ([facility]) completed 09-21-2015  . Wears dentures    bottom  . Wears glasses     Past Surgical History:  Procedure Laterality Date  . BIOPSY  05/29/2019   Procedure: BIOPSY;  Surgeon: Rogene Houston, MD;  [facility];  Service: Endoscopy;;  gastric ulcer  . CARPAL TUNNEL RELEASE Right 07/09/2014   Procedure: RIGHT OPEN CARPAL TUNNEL RELEASE;  Surgeon: Mcarthur Rossetti, MD;  [facility];  Service: Orthopedics;  Laterality: Right;  . CHOLECYSTECTOMY N/A 02/24/2014   Procedure: LAPAROSCOPIC CHOLECYSTECTOMY;  Surgeon: Coralie Keens, MD;  Location: [place];  Service: General;  Laterality: N/A;  . COLONOSCOPY    . CYSTOLITHOTOMY  11/ [facility]      [facility] [place]  .  CYSTOSCOPY N/A 04/18/2018   Procedure: CYSTOSCOPY FLEXIBLE;  Surgeon: Irine Seal, MD;  [facility]: [facility];  Service: Urology;  Laterality: N/A;  . CYSTOSCOPY W/ LITHOLAPAXY / EHL  02-24-2007   dr Janice Norrie  [facility]  . dental implant     No teeth at this time waiting for them to be made as of 01-30-19  . ESOPHAGOGASTRODUODENOSCOPY (EGD) WITH PROPOFOL N/A 05/29/2019   Procedure: ESOPHAGOGASTRODUODENOSCOPY (EGD) WITH PROPOFOL;  Surgeon: Rogene Houston, MD;  [facility];  Service: Endoscopy;  Laterality: N/A;  925  . GOLD SEED IMPLANT N/A 09/05/2017   Procedure: GOLD SEED IMPLANT;  Surgeon: Irine Seal, MD;  Location: [facility];  Service: Urology;  Laterality: N/A;  . INSERTION OF SUPRAPUBIC CATHETER N/A 04/18/2018   Procedure: SUPRAPUBIC TUBE CHANGE;  Surgeon: Irine Seal, MD;  [facility];  Service: Urology;  Laterality: N/A;  . IR CATHETER TUBE CHANGE  02/24/2018  . SPACE OAR INSTILLATION N/A 09/05/2017   Procedure: SPACE OAR INSTILLATION;  Surgeon: Irine Seal, MD;  [facility];  Service: Urology;  Laterality: N/A;  . TOTAL ANKLE ARTHROPLASTY Right 04/20/2015   Procedure: TOTAL ANKLE ARTHOPLASTY;  Surgeon: Newt Minion, MD;  Location: [place];  Service: Orthopedics;  Laterality: Right;  . TRANSTHORACIC ECHOCARDIOGRAM  11/16/2012   ef 55-60%,  grade 1 diastolic dysfunction/  mild dilated ascending aorta/  mild MR/ trivial TR  . TRANSURETHRAL RESECTION OF PROSTATE N/A 02/03/2019   Procedure: TRANSURETHRAL RESECTION OF THE PROSTATE (TURP);  Surgeon: Irine Seal, MD;  [facility];  Service: Urology;  Laterality: N/A;  . VIDEO ASSISTED THORACOSCOPY (VATS)/ LOBECTOMY Right 07/04/2015   Procedure: VIDEO ASSISTED THORACOSCOPY (VATS)/ RIGHT UPPER LOBECTOMY;  Surgeon: Melrose Nakayama, MD;  Location: [place];  Service: Thoracic;  Laterality: Right;  .  VIDEO BRONCHOSCOPY N/A 06/17/2015   Procedure: VIDEO BRONCHOSCOPY;  Surgeon: Melrose Nakayama, MD;   Location: [place];  Service: Thoracic;  Laterality: N/A;  . VIDEO BRONCHOSCOPY WITH ENDOBRONCHIAL NAVIGATION N/A 06/17/2015   Procedure: VIDEO BRONCHOSCOPY WITH ENDOBRONCHIAL NAVIGATION;  Surgeon: Melrose Nakayama, MD;  Location: [place];  Service: Thoracic;  Laterality: N/A;  . VIDEO BRONCHOSCOPY WITH ENDOBRONCHIAL ULTRASOUND N/A 06/17/2015   Procedure: VIDEO BRONCHOSCOPY WITH ENDOBRONCHIAL ULTRASOUND;  Surgeon: Melrose Nakayama, MD;  Location: [place];  Service: Thoracic;  Laterality: N/A;    Social History   Socioeconomic History  . Marital status: Widowed    Spouse name: Not on file  . Number of children: 2  . Years of education: college  . Highest education level: Not on file  Occupational History  . Occupation: retired  Tobacco Use  . Smoking status: Former Smoker    Packs/day: 0.50    Years: 40.00    Pack years: 20.00    Types: Cigarettes    Quit date: 02/13/2015    Years since quitting: 4.8  . Smokeless tobacco: Never Used  Vaping Use  . Vaping Use: Never used  Substance and Sexual Activity  . Alcohol use: No    Alcohol/week: 0.0 standard drinks  . Drug use: No  . Sexual activity: Not Currently  Other Topics Concern  . Not on file  Social History Narrative  . Not on file   Social Determinants of Health   Financial Resource Strain:   . Difficulty of Paying Living Expenses: Not on file  Food Insecurity:   . Worried About Charity fundraiser in the Last Year: Not on file  . Ran Out of Food in the Last Year: Not on file  Transportation Needs:   . Lack of Transportation (Medical): Not on file  . Lack of Transportation (Non-Medical): Not on file  Physical Activity:   . Days of Exercise per Week: Not on file  . Minutes of Exercise per Session: Not on file  Stress:   . Feeling of Stress : Not on file  Social Connections:   . Frequency of Communication with Friends and Family: Not on file  . Frequency of Social Gatherings with Friends and Family: Not on file  .  Attends Religious Services: Not on file  . Active Member of Clubs or Organizations: Not on file  . Attends Archivist Meetings: Not on file  . Marital Status: Not on file  Intimate Partner Violence:   . Fear of Current or Ex-Partner: Not on file  . Emotionally Abused: Not on file  . Physically Abused: Not on file  . Sexually Abused: Not on file    Family History  Problem Relation Age of Onset  . Cancer Father        Asbestos  . Colon cancer Neg Hx   . Colon polyps Neg Hx   . Kidney disease Neg Hx   . Esophageal cancer Neg Hx   . Gallbladder disease Neg Hx   . Heart disease Neg Hx   . Diabetes Neg Hx        Objective: Vitals:   12/25/19 1543  BP: 130/79  Pulse: 72  Temp: 98.6 F (37 C)     Physical Exam  Lab Results:  Results for orders placed or performed in visit on 12/25/19 (from the past 24 hour(s))  Urinalysis, Routine w reflex microscopic     Status: Abnormal   Collection Time: 12/25/19  3:35 PM  Result Value Ref Range   Specific Gravity, UA 1.025 1.005 - 1.030   pH, UA 5.5 5.0 - 7.5   Color, UA Amber (A) Yellow   Appearance Ur Cloudy (A) Clear   Leukocytes,UA 2+ (A) Negative   Protein,UA 2+ (A) Negative/Trace   Glucose, UA Negative Negative   Ketones, UA Negative Negative   RBC, UA 1+ (A) Negative   Bilirubin, UA Negative Negative   Urobilinogen, Ur 0.2 0.2 - 1.0 mg/dL   Nitrite, UA Negative Negative   Microscopic Examination See below:    Narrative   Performed at:  [facility] [address] [place], [place]  [id number] Lab Director: Mina Marble MT, Phone:  [phone]  Microscopic Examination     Status: Abnormal   Collection Time: 12/25/19  3:35 PM   Urine  Result Value Ref Range   WBC, UA >30 (A) 0 - 5 /hpf   RBC 3-10 (A) 0 - 2 /hpf   Epithelial Cells (non renal) None seen 0 - 10 /hpf   Renal Epithel, UA None seen None seen /hpf   Bacteria, UA Few (A) None seen/Few   Narrative   Performed at:  [facility] [address], [place], [place]  [id number] Lab Director: [place], Phone:  [phone]    BMET No results for input(s): NA, K, CL, CO2, GLUCOSE, BUN, CREATININE, CALCIUM in the last 72 hours. PSA PSA  Date Value Ref Range Status  08/21/2019 <0.1 < OR = 4.0 ng/mL Final    Comment:    The total PSA value from this assay system is  standardized against the WHO standard. The test  result will be approximately 20% lower when compared  to the equimolar-standardized total PSA (Beckman  Coulter). Comparison of serial PSA results should be  interpreted with this fact in mind. . This test was performed using the Siemens  chemiluminescent method. Values obtained from  different assay methods cannot be used interchangeably. PSA levels, regardless of value, should not be interpreted as absolute evidence of the presence or absence of disease.   04/06/2019 <0.1 < OR = 4.0 ng/mL Final    Comment:    The total PSA value from this assay system is  standardized against the WHO standard. The test  result will be approximately 20% lower when compared  to the equimolar-standardized total PSA (Beckman  Coulter). Comparison of serial PSA results should be  interpreted with this fact in mind. . This test was performed using the Siemens  chemiluminescent method. Values obtained from  different assay methods cannot be used interchangeably. PSA levels, regardless of value, should not be interpreted as absolute evidence of the presence or absence of disease.   10/26/2008 0.69 0.10 - 4.00 ng/mL Final    Comment:    See lab report for associated comment(s)   Testosterone  Date Value Ref Range Status  08/21/2019 15 (L) 250 - 827 ng/dL Final    Comment:    In hypogonadal males, Testosterone, Total, LC/MS/MS, is the recommended assay due to the diminished accuracy of immunoassay at levels below 250 ng/dL. This test code [phone]) must be collected in a red-top tube with no  gel.    04/06/2019 14 (L) 250 - 827 ng/dL Final    Comment:    In hypogonadal males, Testosterone, Total, LC/MS/MS, is the recommended assay due to the diminished accuracy of immunoassay at levels below 250 ng/dL. This test code [phone]) must be collected in a  red-top tube with no gel.    05/20/2017 255 (L) 264 - 916 ng/dL Final    Comment:    (NOTE) Adult male reference interval is based on a population of healthy nonobese males (BMI <30) between 3 and 61 years old. [place], [place] [phone]. PMID: [id number]. Performed At: [facility] [place], [place] [id number] Rush Farmer MD DZ:[id number] Performed at [facility], [address], [place], [place] [address]       Studies/Results: PSA and T reviewed.     Assessment & Plan: Prostate cancer.   He got his last Lupron in 1/21.  He will have a PSA and testosterone in 3 months.   OAB with incontinence with an elevated PVR.  I am going to have him stop the Oxybutynin and will begin PTNS.  Recurrent UTI's.  I will get a culture today and if it is positive I will treat and put him on suppressive meds.    ED.  I will give him a script for tadalafil for daily use.   Meds ordered this encounter  Medications  . tadalafil (CIALIS) 5 MG tablet    Sig: Take 1 tablet (5 mg total) by mouth daily as needed for erectile dysfunction.    Dispense:  30 tablet    Refill:  11     Orders Placed This Encounter  Procedures  . Microscopic Examination  . Urine Culture  . Urinalysis, Routine w reflex microscopic  . PSA    Standing Status:   Future    Standing Expiration Date:   04/26/2020  . Testosterone    Standing Status:   Future    Standing Expiration Date:   04/26/2020      Return in about 3 months (around 03/26/2020).   CC: [name], PA-C      Irine Seal 12/25/2019

## 2019-12-25 NOTE — Progress Notes (Signed)
Urological Symptom Review  Patient is experiencing the following symptoms: Frequent urination Burning/pain with urination Get up at night to urinate Leakage of urine   Review of Systems  Gastrointestinal (upper)  : Negative for upper GI symptoms  Gastrointestinal (lower) : Negative for lower GI symptoms  Constitutional : Night Sweats Weight loss Fatigue  Skin: Skin rash/lesion Itching  Eyes: Negative for eye symptoms  Ear/Nose/Throat : Negative for Ear/Nose/Throat symptoms  Hematologic/Lymphatic: Negative for Hematologic/Lymphatic symptoms  Cardiovascular : Leg swelling  Respiratory : Shortness of breath  Endocrine: Negative for endocrine symptoms  Musculoskeletal: Back pain  Neurological: Negative for neurological symptoms  Psychologic: Depression Anxiety

## 2019-12-28 ENCOUNTER — Telehealth: Payer: Self-pay

## 2019-12-28 DIAGNOSIS — E1143 Type 2 diabetes mellitus with diabetic autonomic (poly)neuropathy: Secondary | ICD-10-CM | POA: Diagnosis not present

## 2019-12-28 DIAGNOSIS — I10 Essential (primary) hypertension: Secondary | ICD-10-CM | POA: Diagnosis not present

## 2019-12-28 DIAGNOSIS — E1142 Type 2 diabetes mellitus with diabetic polyneuropathy: Secondary | ICD-10-CM | POA: Diagnosis not present

## 2019-12-28 DIAGNOSIS — J449 Chronic obstructive pulmonary disease, unspecified: Secondary | ICD-10-CM | POA: Diagnosis not present

## 2019-12-28 DIAGNOSIS — C61 Malignant neoplasm of prostate: Secondary | ICD-10-CM | POA: Diagnosis not present

## 2019-12-28 DIAGNOSIS — F411 Generalized anxiety disorder: Secondary | ICD-10-CM | POA: Diagnosis not present

## 2019-12-28 LAB — URINE CULTURE

## 2019-12-28 NOTE — Telephone Encounter (Signed)
Pt.notified

## 2019-12-28 NOTE — Telephone Encounter (Signed)
-----   Message from Irine Seal, MD sent at 12/28/2019 10:22 AM EDT ----- Mx species.  No antibiotics needed.  I did complete a letter for him for the [place].  Marlowe Kays, in [place] should be sending it to him.

## 2019-12-30 DIAGNOSIS — J449 Chronic obstructive pulmonary disease, unspecified: Secondary | ICD-10-CM | POA: Diagnosis not present

## 2019-12-30 DIAGNOSIS — F411 Generalized anxiety disorder: Secondary | ICD-10-CM | POA: Diagnosis not present

## 2019-12-30 DIAGNOSIS — E1142 Type 2 diabetes mellitus with diabetic polyneuropathy: Secondary | ICD-10-CM | POA: Diagnosis not present

## 2019-12-30 DIAGNOSIS — C61 Malignant neoplasm of prostate: Secondary | ICD-10-CM | POA: Diagnosis not present

## 2019-12-30 DIAGNOSIS — E1143 Type 2 diabetes mellitus with diabetic autonomic (poly)neuropathy: Secondary | ICD-10-CM | POA: Diagnosis not present

## 2019-12-30 DIAGNOSIS — I10 Essential (primary) hypertension: Secondary | ICD-10-CM | POA: Diagnosis not present

## 2020-01-04 DIAGNOSIS — F411 Generalized anxiety disorder: Secondary | ICD-10-CM | POA: Diagnosis not present

## 2020-01-04 DIAGNOSIS — J449 Chronic obstructive pulmonary disease, unspecified: Secondary | ICD-10-CM | POA: Diagnosis not present

## 2020-01-04 DIAGNOSIS — C61 Malignant neoplasm of prostate: Secondary | ICD-10-CM | POA: Diagnosis not present

## 2020-01-04 DIAGNOSIS — E1142 Type 2 diabetes mellitus with diabetic polyneuropathy: Secondary | ICD-10-CM | POA: Diagnosis not present

## 2020-01-04 DIAGNOSIS — I10 Essential (primary) hypertension: Secondary | ICD-10-CM | POA: Diagnosis not present

## 2020-01-04 DIAGNOSIS — E1143 Type 2 diabetes mellitus with diabetic autonomic (poly)neuropathy: Secondary | ICD-10-CM | POA: Diagnosis not present

## 2020-01-06 ENCOUNTER — Encounter: Payer: Self-pay | Admitting: Neurology

## 2020-01-06 ENCOUNTER — Ambulatory Visit (INDEPENDENT_AMBULATORY_CARE_PROVIDER_SITE_OTHER): Payer: Medicare Other | Admitting: Neurology

## 2020-01-06 VITALS — BP 130/73 | HR 79 | Ht >= 80 in | Wt 257.0 lb

## 2020-01-06 DIAGNOSIS — G5621 Lesion of ulnar nerve, right upper limb: Secondary | ICD-10-CM

## 2020-01-06 DIAGNOSIS — I10 Essential (primary) hypertension: Secondary | ICD-10-CM | POA: Diagnosis not present

## 2020-01-06 DIAGNOSIS — R269 Unspecified abnormalities of gait and mobility: Secondary | ICD-10-CM | POA: Diagnosis not present

## 2020-01-06 DIAGNOSIS — E1142 Type 2 diabetes mellitus with diabetic polyneuropathy: Secondary | ICD-10-CM | POA: Diagnosis not present

## 2020-01-06 DIAGNOSIS — C61 Malignant neoplasm of prostate: Secondary | ICD-10-CM | POA: Diagnosis not present

## 2020-01-06 DIAGNOSIS — E1143 Type 2 diabetes mellitus with diabetic autonomic (poly)neuropathy: Secondary | ICD-10-CM | POA: Diagnosis not present

## 2020-01-06 DIAGNOSIS — E538 Deficiency of other specified B group vitamins: Secondary | ICD-10-CM

## 2020-01-06 DIAGNOSIS — J449 Chronic obstructive pulmonary disease, unspecified: Secondary | ICD-10-CM | POA: Diagnosis not present

## 2020-01-06 DIAGNOSIS — F411 Generalized anxiety disorder: Secondary | ICD-10-CM | POA: Diagnosis not present

## 2020-01-06 HISTORY — DX: Unspecified abnormalities of gait and mobility: R26.9

## 2020-01-06 NOTE — Progress Notes (Signed)
Reason for visit: Gait disorder  Referring physician: Dr. Bruna Potter Jonathan [name] is a 79 y.o. male  History of present illness:  Jonathan [name] is a 79 year old right-handed black male with a history of diabetes and diabetic peripheral neuropathy.  The patient has been followed by Dr. Christella Noa from neurosurgery as well for chronic neck and low back issues.  He has numbness and discomfort down the right arm, he has what appears to be an ulnar neuropathy that is severe involving the right hand, and he claims he has a cyst in the low back with pain and numbness down the right leg.  He had degenerative changes in the right ankle and required surgery through Dr. Sharol Given.  He has a prior history of prostate cancer and lung cancer.  He has chronic urinary incontinence.  The patient has had a gradual change in his balance over the last 4 years or so, he uses a cane or walker for ambulation.  He has not had recent falls, he may stumble on occasion.  He reports numbness in both feet, up halfway below the knee on the left and up to the thigh and hip area on the right.  The patient feels weak in the legs.  He currently is in physical therapy which was just recently started.  He comes to this office for further evaluation.  Past Medical History:  Diagnosis Date  . Acquired bladder diverticulum    12/ 2012  resection diverticulum done at [facility] in [place], [place]  . Age-related cataract of right eye    scheduled for catarat extraction 09-12-2017  . Agent orange exposure   . Aneurysm of infrarenal abdominal aorta ([place])    first dx 2017--- last CT 06-11-2017  measures 3.5cm  . Chronic constipation   . COPD with emphysema ([place])    06--12-2017  per pt no symptoms since Feb 2019  . Depression    PTSD  . Diabetes mellitus type 2, diet-controlled ([place])   . Diabetic neuropathy ([place])   . Dyslipidemia   . Dyspnea on exertion   . Erectile dysfunction   . Feeling of incomplete bladder emptying   . GERD  (gastroesophageal reflux disease)   . Gout    09-02-2017 last flare up 3 months ago  . Hiatal hernia   . History of atrial fibrillation    episode post op right lung lobectomy 07-04-2015  . History of bladder stone   . History of colon polyps   . History of DVT of lower extremity    03/ 2019  bilateral lower extremity (superficial) ---  per treated w/ oral medication   . History of gastritis   . History of kidney stones   . History of radiation therapy 09-14-2015  to 09-21-2015   left upper lung nodule -- 54Gy in 3 fractions (18 Gy per fraction)  . Hyperplasia of prostate with lower urinary tract symptoms (LUTS)   . Hypertension   . Lumbar spinal stenosis   . Nephrolithiasis   . Neurogenic bladder   . Osteoarthritis    ankle, hands  . Osteoporosis   . Prostate cancer [facility]) urologist-  dr wrenn/  oncologist-  dr Tammi Klippel   dx 05-24-2017-- Stage T2b,  Gleason 4+4,  PSA 22.41,  vol 38cc--- started ADT 04/ 2019,  plan external radiation therapy  . Radiation fibrosis of lung ([place])    hx left upper long nodule SBRT 06/ 2017  . Squamous cell carcinoma of both lungs (H[facility]) last CT  in epic dated 06-26-2017 no recurrence   dx 03/ 2017  non-small cell SCC via bronchoscopy w/ bx's by dr Roxan Hockey---  s/p  right VATS w/ right lobectomy and node dissection's 07-04-2015 /  pt had SBRT to left upper nodule Stage 1 ([facility]) completed 09-21-2015  . Wears dentures    bottom  . Wears glasses     Past Surgical History:  Procedure Laterality Date  . BIOPSY  05/29/2019   Procedure: BIOPSY;  Surgeon: Rogene Houston, MD;  [facility];  Service: Endoscopy;;  gastric ulcer  . CARPAL TUNNEL RELEASE Right 07/09/2014   Procedure: RIGHT OPEN CARPAL TUNNEL RELEASE;  Surgeon: Mcarthur Rossetti, MD;  [facility];  Service: Orthopedics;  Laterality: Right;  . CHOLECYSTECTOMY N/A 02/24/2014   Procedure: LAPAROSCOPIC CHOLECYSTECTOMY;  Surgeon: Coralie Keens, MD;  Location: [facility];  Service: General;  Laterality: N/A;  . COLONOSCOPY    . CYSTOLITHOTOMY  11[facility]      [facility] [place]  . CYSTOSCOPY N/A 04/18/2018   Procedure: CYSTOSCOPY FLEXIBLE;  Surgeon: Irine Seal, MD;  [facility]: [facility];  Service: Urology;  Laterality: N/A;  . CYSTOSCOPY W/ LITHOLAPAXY / EHL  02-24-2007   dr Janice Norrie  [facility]  . dental implant     No teeth at this time waiting for them to be made as of 01-30-19  . ESOPHAGOGASTRODUODENOSCOPY (EGD) WITH PROPOFOL N/A 05/29/2019   Procedure: ESOPHAGOGASTRODUODENOSCOPY (EGD) WITH PROPOFOL;  Surgeon: Rogene Houston, MD;  [facility];  Service: Endoscopy;  Laterality: N/A;  925  . GOLD SEED IMPLANT N/A 09/05/2017   Procedure: GOLD SEED IMPLANT;  Surgeon: Irine Seal, MD;  Location: [facility];  Service: Urology;  Laterality: N/A;  . INSERTION OF SUPRAPUBIC CATHETER N/A 04/18/2018   Procedure: SUPRAPUBIC TUBE CHANGE;  Surgeon: Irine Seal, MD;  [facility];  Service: Urology;  Laterality: N/A;  . IR CATHETER TUBE CHANGE  02/24/2018  . SPACE OAR INSTILLATION N/A 09/05/2017   Procedure: SPACE OAR INSTILLATION;  Surgeon: Irine Seal, MD;  Location: [facility];  Service: Urology;  Laterality: N/A;  . TOTAL ANKLE ARTHROPLASTY Right 04/20/2015   Procedure: TOTAL ANKLE ARTHOPLASTY;  Surgeon: Newt Minion, MD;  Location: [place];  Service: Orthopedics;  Laterality: Right;  . TRANSTHORACIC ECHOCARDIOGRAM  11/16/2012   ef 55-60%,  grade 1 diastolic dysfunction/  mild dilated ascending aorta/  mild MR/ trivial TR  . TRANSURETHRAL RESECTION OF PROSTATE N/A 02/03/2019   Procedure: TRANSURETHRAL RESECTION OF THE PROSTATE (TURP);  Surgeon: Irine Seal, MD;  [facility];  Service: Urology;  Laterality: N/A;  . VIDEO ASSISTED THORACOSCOPY (VATS)/ LOBECTOMY Right 07/04/2015   Procedure: VIDEO ASSISTED THORACOSCOPY (VATS)/ RIGHT UPPER LOBECTOMY;  Surgeon: Melrose Nakayama, MD;  Location: [place];  Service: Thoracic;   Laterality: Right;  [name] VIDEO BRONCHOSCOPY N/A 06/17/2015   Procedure: VIDEO BRONCHOSCOPY;  Surgeon: Melrose Nakayama, MD;  Location: [place];  Service: Thoracic;  Laterality: N/A;  . VIDEO BRONCHOSCOPY WITH ENDOBRONCHIAL NAVIGATION N/A 06/17/2015   Procedure: VIDEO BRONCHOSCOPY WITH ENDOBRONCHIAL NAVIGATION;  Surgeon: Melrose Nakayama, MD;  Location: [place];  Service: Thoracic;  Laterality: N/A;  . VIDEO BRONCHOSCOPY WITH ENDOBRONCHIAL ULTRASOUND N/A 06/17/2015   Procedure: VIDEO BRONCHOSCOPY WITH ENDOBRONCHIAL ULTRASOUND;  Surgeon: Melrose Nakayama, MD;  [facility];  Service: Thoracic;  Laterality: N/A;    Family History  Problem Relation Age of Onset  . Cancer Father  Asbestos  . Colon cancer Neg Hx   . Colon polyps Neg Hx   . Kidney disease Neg Hx   . Esophageal cancer Neg Hx   . Gallbladder disease Neg Hx   . Heart disease Neg Hx   . Diabetes Neg Hx     Social history:  reports that he quit smoking about 4 years ago. His smoking use included cigarettes. He has a 20.00 pack-year smoking history. He has never used smokeless tobacco. He reports that he does not drink alcohol and does not use drugs.  Medications:  Prior to Admission medications   Medication Sig Start Date End Date Taking? Authorizing Provider  atenolol-chlorthalidone (TENORETIC) 50-25 MG tablet Take 1 tablet by mouth at bedtime.  12/03/18  Yes [provider]  ciclopirox (PENLAC) 8 % solution Apply topically at bedtime. Apply over nail and surrounding skin. Apply daily over previous coat. After seven (7) days, may remove with alcohol and continue cycle.   Yes [provider]  ciprofloxacin (CIPRO) 250 MG tablet Take 1 tablet (250 mg total) by mouth every 12 (twelve) hours. 10/04/19  Yes Aberman, Caroline C, PA-C  diclofenac Sodium (VOLTAREN) 1 % GEL Apply 2 g topically 4 (four) times daily as needed (pain).   Yes [provider]  escitalopram (LEXAPRO) 5 MG tablet Take 5 mg by  mouth daily.   Yes [provider]  FREESTYLE LITE test strip  09/02/19  Yes [provider]  gabapentin (NEURONTIN) 400 MG capsule Take 400 mg by mouth at bedtime.    Yes [provider]  hydroquinone 4 % cream Apply topically 2 (two) times daily.   Yes [provider]  ibuprofen (ADVIL) 600 MG tablet  05/26/19  Yes [provider]  Incontinence Supply Disposable (FITTED BRIEFS MAXIMUM XL) MISC Use briefs as needed daily for incontinence 06/05/19  Yes Irine Seal, MD  Lancets (FREESTYLE) lancets  09/02/19  Yes [provider]  LANTUS SOLOSTAR 100 UNIT/ML Solostar Pen Inject 2.5 Units into the skin daily.  11/28/17  Yes [provider]  Rolan Lipa 72 MCG capsule  05/21/19  Yes [provider]  metFORMIN (GLUCOPHAGE) 500 MG tablet metformin 500 mg tablet 07/17/19 04/11/20 Yes [provider]  metoCLOPramide (REGLAN) 5 MG tablet Take 5 mg by mouth in the morning, at noon, and at bedtime.   Yes [provider]  mupirocin cream (BACTROBAN) 2 % Apply 1 application topically 2 (two) times daily as needed (wound care).   Yes [provider]  mupirocin ointment (BACTROBAN) 2 % Apply topically. 09/10/19  Yes [provider]  nystatin ointment (MYCOSTATIN)  09/03/19  Yes [provider]  nystatin-triamcinolone ointment (MYCOLOG) Apply 1 application topically 2 (two) times daily. To groins 09/11/19  Yes Irine Seal, MD  oxybutynin (DITROPAN) 5 MG tablet Take 1 tablet (5 mg total) by mouth 3 (three) times daily. 08/21/19  Yes Irine Seal, MD  Oxycodone HCl 20 MG TABS Take 5 mg by mouth in the morning, at noon, in the evening, and at bedtime.  02/02/18  Yes [provider]  pantoprazole (PROTONIX) 40 MG tablet Take 1 tablet (40 mg total) by mouth 2 (two) times daily before a meal. 05/29/19  Yes Rehman, Mechele Dawley, MD  potassium chloride (KLOR-CON) 10 MEQ tablet Take 10 mEq by mouth daily.   Yes [provider]  predniSONE (DELTASONE) 20 MG tablet Take 20 mg by mouth at bedtime.    Yes [provider]  psyllium (  METAMUCIL SMOOTH TEXTURE) 58.6 % powder Take 1 packet by mouth daily. Patient taking differently: Take 1 packet by mouth every other day.  04/27/19  Yes Rehman, Mechele Dawley, MD  sildenafil (VIAGRA) 100 MG tablet Take 100 mg by mouth daily as needed for erectile dysfunction.    Yes [provider]  sulfamethoxazole-trimethoprim (BACTRIM DS) 800-160 MG tablet Take 1 tablet by mouth 2 (two) times daily. 09/21/19  Yes Irine Seal, MD  tadalafil (CIALIS) 5 MG tablet Take 1 tablet (5 mg total) by mouth daily as needed for erectile dysfunction. 12/25/19  Yes Irine Seal, MD  terbinafine (LAMISIL) 250 MG tablet Take 250 mg by mouth daily. 09/03/19  Yes [provider]  timolol (TIMOPTIC) 0.5 % ophthalmic solution Place 1 drop into both eyes 2 (two) times daily as needed (eye pressure).   Yes [provider]      Allergies  Allergen Reactions  . Ampicillin   . Ciprofloxacin Other (See Comments)    Doesn't work  . Levaquin [Levofloxacin] Other (See Comments)    Doesn't work    ROS:  Out of a complete 14 system review of symptoms, the patient complains only of the following symptoms, and all other reviewed systems are negative.  Chronic neck and low back pain Walking difficulty, numbness in the legs Weakness Urinary incontinence  Blood pressure 130/73, pulse 79, height 6\' 8"  (2.032 m), weight 257 lb (116.6 kg).  Physical Exam  General: The patient is alert and cooperative at the time of the examination.  Eyes: Pupils are equal, round, and reactive to light. Discs are flat bilaterally.  Neck: The neck is supple, no carotid bruits are noted.  Respiratory: The respiratory examination is clear.  Cardiovascular: The cardiovascular examination reveals a regular rate and rhythm, no obvious murmurs or rubs are noted.  Skin: Extremities are with 1+  edema below the knees bilaterally.  Neurologic Exam  Mental status: The patient is alert and oriented x 3 at the time of the examination. The patient has apparent normal recent and remote memory, with an apparently normal attention span and concentration ability.  Cranial nerves: Facial symmetry is present. There is good sensation of the face to pinprick and soft touch bilaterally. The strength of the facial muscles and the muscles to head turning and shoulder shrug are normal bilaterally. Speech is well enunciated, no aphasia or dysarthria is noted. Extraocular movements are full. Visual fields are full. The tongue is midline, and the patient has symmetric elevation of the soft palate. No obvious hearing deficits are noted.  Motor: The motor testing reveals significant weakness with intrinsic muscles of the right hand with wasting the first dorsal interosseous muscle, weakness with the distal flexors of the fourth and fifth fingers.  The patient has giveaway weakness with flexion and extension of the elbow on the right.  Good strength is seen in the left arm.  The patient has weakness with hip flexion bilaterally, 4-/5 on the right, 4/5 on the left, possibly a mild foot drop on the right.  Sensory: Sensory testing is intact to pinprick, soft touch, vibration sensation, and position sense on the upper extremities.  With the lower extremities there is a stocking pattern pinprick sensory deficit up to the knee on the right, the distal two thirds on the left.  Prominent impairment of vibration and position sense is seen in both feet.  No evidence of extinction is noted.  Coordination: Cerebellar testing reveals good finger-nose-finger and heel-to-shin bilaterally.  Gait and  station: Gait is wide-based, unsteady.  The patient is using a quad cane for ambulation.  Tandem gait was not attempted.  Romberg is unsteady, with a tendency to fall backwards.  Reflexes: Deep tendon reflexes are symmetric, but are  depressed bilaterally. Toes are downgoing bilaterally.   Assessment/Plan:  1.  Chronic gait disorder  2.  Diabetic peripheral neuropathy  3.  Right ulnar neuropathy  4.  Chronic neck pain  5.  Chronic low back pain, right leg discomfort  The patient likely has a multifactorial gait disorder, the primary etiology is the peripheral neuropathy that appears to be severe.  The patient claims that he has had a skin biopsy done that confirms the presence of a neuropathy.  The patient has overlying cervical spine and lumbar spine disease as well.  He currently is in physical therapy.  We will check blood work today, he will have nerve conductions on both legs and the right arm, EMG on the right leg.  We will follow-up otherwise in 4 months.  The patient apparently has a scooter, walker, and a canes at home.  He may be applying for some assistance in the home environment.  Jill Alexanders MD 01/06/2020 8:24 AM  [facility] [address] [place], [place] [address]  Phone [phone] Fax [phone]

## 2020-01-08 DIAGNOSIS — Z6830 Body mass index (BMI) 30.0-30.9, adult: Secondary | ICD-10-CM | POA: Diagnosis not present

## 2020-01-08 DIAGNOSIS — I10 Essential (primary) hypertension: Secondary | ICD-10-CM | POA: Diagnosis not present

## 2020-01-08 DIAGNOSIS — M48061 Spinal stenosis, lumbar region without neurogenic claudication: Secondary | ICD-10-CM | POA: Diagnosis not present

## 2020-01-09 LAB — MULTIPLE MYELOMA PANEL, SERUM
Albumin SerPl Elph-Mcnc: 3.6 g/dL (ref 2.9–4.4)
Albumin/Glob SerPl: 1.1 (ref 0.7–1.7)
Alpha 1: 0.2 g/dL (ref 0.0–0.4)
Alpha2 Glob SerPl Elph-Mcnc: 0.8 g/dL (ref 0.4–1.0)
B-Globulin SerPl Elph-Mcnc: 1 g/dL (ref 0.7–1.3)
Gamma Glob SerPl Elph-Mcnc: 1.5 g/dL (ref 0.4–1.8)
Globulin, Total: 3.5 g/dL (ref 2.2–3.9)
IgA/Immunoglobulin A, Serum: 313 mg/dL (ref 61–437)
IgG (Immunoglobin G), Serum: 1531 mg/dL (ref 603–1613)
IgM (Immunoglobulin M), Srm: 78 mg/dL (ref 15–143)
Total Protein: 7.1 g/dL (ref 6.0–8.5)

## 2020-01-09 LAB — SEDIMENTATION RATE: Sed Rate: 20 mm/hr (ref 0–30)

## 2020-01-09 LAB — ANA W/REFLEX: Anti Nuclear Antibody (ANA): POSITIVE — AB

## 2020-01-09 LAB — ENA+DNA/DS+SJORGEN'S
ENA RNP Ab: 0.3 AI (ref 0.0–0.9)
ENA SM Ab Ser-aCnc: <0.2 AI (ref 0.0–0.9)
ENA SSA (RO) Ab: <0.2 AI (ref 0.0–0.9)
ENA SSB (LA) Ab: <0.2 AI (ref 0.0–0.9)
dsDNA Ab: 153 IU/mL — ABNORMAL HIGH (ref 0–9)

## 2020-01-09 LAB — VITAMIN B12: Vitamin B-12: 334 pg/mL (ref 232–1245)

## 2020-01-09 LAB — ANGIOTENSIN CONVERTING ENZYME: Angio Convert Enzyme: 29 U/L (ref 14–82)

## 2020-01-09 LAB — B. BURGDORFI ANTIBODIES: Lyme IgG/IgM Ab: <0.91 {ISR} (ref 0.00–0.90)

## 2020-01-11 DIAGNOSIS — C61 Malignant neoplasm of prostate: Secondary | ICD-10-CM | POA: Diagnosis not present

## 2020-01-11 DIAGNOSIS — F411 Generalized anxiety disorder: Secondary | ICD-10-CM | POA: Diagnosis not present

## 2020-01-11 DIAGNOSIS — E1142 Type 2 diabetes mellitus with diabetic polyneuropathy: Secondary | ICD-10-CM | POA: Diagnosis not present

## 2020-01-11 DIAGNOSIS — J449 Chronic obstructive pulmonary disease, unspecified: Secondary | ICD-10-CM | POA: Diagnosis not present

## 2020-01-11 DIAGNOSIS — I10 Essential (primary) hypertension: Secondary | ICD-10-CM | POA: Diagnosis not present

## 2020-01-11 DIAGNOSIS — E1143 Type 2 diabetes mellitus with diabetic autonomic (poly)neuropathy: Secondary | ICD-10-CM | POA: Diagnosis not present

## 2020-01-13 ENCOUNTER — Other Ambulatory Visit (HOSPITAL_COMMUNITY): Payer: Self-pay | Admitting: Family Medicine

## 2020-01-13 ENCOUNTER — Other Ambulatory Visit: Payer: Self-pay | Admitting: Family Medicine

## 2020-01-13 DIAGNOSIS — F411 Generalized anxiety disorder: Secondary | ICD-10-CM | POA: Diagnosis not present

## 2020-01-13 DIAGNOSIS — I10 Essential (primary) hypertension: Secondary | ICD-10-CM | POA: Diagnosis not present

## 2020-01-13 DIAGNOSIS — Z77098 Contact with and (suspected) exposure to other hazardous, chiefly nonmedicinal, chemicals: Secondary | ICD-10-CM

## 2020-01-13 DIAGNOSIS — K5901 Slow transit constipation: Secondary | ICD-10-CM | POA: Diagnosis not present

## 2020-01-13 DIAGNOSIS — C61 Malignant neoplasm of prostate: Secondary | ICD-10-CM | POA: Diagnosis not present

## 2020-01-13 DIAGNOSIS — Z0001 Encounter for general adult medical examination with abnormal findings: Secondary | ICD-10-CM | POA: Diagnosis not present

## 2020-01-13 DIAGNOSIS — E1143 Type 2 diabetes mellitus with diabetic autonomic (poly)neuropathy: Secondary | ICD-10-CM | POA: Diagnosis not present

## 2020-01-13 DIAGNOSIS — R350 Frequency of micturition: Secondary | ICD-10-CM | POA: Diagnosis not present

## 2020-01-13 DIAGNOSIS — Z6827 Body mass index (BMI) 27.0-27.9, adult: Secondary | ICD-10-CM | POA: Diagnosis not present

## 2020-01-14 ENCOUNTER — Ambulatory Visit (INDEPENDENT_AMBULATORY_CARE_PROVIDER_SITE_OTHER): Payer: Medicare Other

## 2020-01-14 ENCOUNTER — Other Ambulatory Visit: Payer: Self-pay

## 2020-01-14 DIAGNOSIS — N3941 Urge incontinence: Secondary | ICD-10-CM

## 2020-01-14 NOTE — Progress Notes (Signed)
PTNS  Session #  1 of 12  Health & Social Factors: NA Caffeine: 2 p/d Alcohol: 0 Daytime voids #per day: 10 Night-time voids #per night: 6 Urgency: Strong Incontinence Episodes #per day: 10 Ankle used: Lft (Pt wants lft ankle used all times) Treatment Setting: 6 Feeling/ Response: Bottom of foot Comments:  Performed By: Valentina Lucks, LPN    Follow Up: 1 week

## 2020-01-15 DIAGNOSIS — M7138 Other bursal cyst, other site: Secondary | ICD-10-CM | POA: Diagnosis not present

## 2020-01-15 DIAGNOSIS — Z902 Acquired absence of lung [part of]: Secondary | ICD-10-CM | POA: Diagnosis not present

## 2020-01-15 DIAGNOSIS — R32 Unspecified urinary incontinence: Secondary | ICD-10-CM | POA: Diagnosis not present

## 2020-01-15 DIAGNOSIS — M24272 Disorder of ligament, left ankle: Secondary | ICD-10-CM | POA: Diagnosis not present

## 2020-01-15 DIAGNOSIS — F411 Generalized anxiety disorder: Secondary | ICD-10-CM | POA: Diagnosis not present

## 2020-01-15 DIAGNOSIS — R350 Frequency of micturition: Secondary | ICD-10-CM | POA: Diagnosis not present

## 2020-01-15 DIAGNOSIS — Z85118 Personal history of other malignant neoplasm of bronchus and lung: Secondary | ICD-10-CM | POA: Diagnosis not present

## 2020-01-15 DIAGNOSIS — R109 Unspecified abdominal pain: Secondary | ICD-10-CM | POA: Diagnosis not present

## 2020-01-15 DIAGNOSIS — Z9181 History of falling: Secondary | ICD-10-CM | POA: Diagnosis not present

## 2020-01-15 DIAGNOSIS — I10 Essential (primary) hypertension: Secondary | ICD-10-CM | POA: Diagnosis not present

## 2020-01-15 DIAGNOSIS — M25571 Pain in right ankle and joints of right foot: Secondary | ICD-10-CM | POA: Diagnosis not present

## 2020-01-15 DIAGNOSIS — K5901 Slow transit constipation: Secondary | ICD-10-CM | POA: Diagnosis not present

## 2020-01-15 DIAGNOSIS — K219 Gastro-esophageal reflux disease without esophagitis: Secondary | ICD-10-CM | POA: Diagnosis not present

## 2020-01-15 DIAGNOSIS — M810 Age-related osteoporosis without current pathological fracture: Secondary | ICD-10-CM | POA: Diagnosis not present

## 2020-01-15 DIAGNOSIS — J449 Chronic obstructive pulmonary disease, unspecified: Secondary | ICD-10-CM | POA: Diagnosis not present

## 2020-01-15 DIAGNOSIS — Z86718 Personal history of other venous thrombosis and embolism: Secondary | ICD-10-CM | POA: Diagnosis not present

## 2020-01-15 DIAGNOSIS — Z87440 Personal history of urinary (tract) infections: Secondary | ICD-10-CM | POA: Diagnosis not present

## 2020-01-15 DIAGNOSIS — C61 Malignant neoplasm of prostate: Secondary | ICD-10-CM | POA: Diagnosis not present

## 2020-01-15 DIAGNOSIS — R42 Dizziness and giddiness: Secondary | ICD-10-CM | POA: Diagnosis not present

## 2020-01-15 DIAGNOSIS — M25572 Pain in left ankle and joints of left foot: Secondary | ICD-10-CM | POA: Diagnosis not present

## 2020-01-15 DIAGNOSIS — E1143 Type 2 diabetes mellitus with diabetic autonomic (poly)neuropathy: Secondary | ICD-10-CM | POA: Diagnosis not present

## 2020-01-15 DIAGNOSIS — E1142 Type 2 diabetes mellitus with diabetic polyneuropathy: Secondary | ICD-10-CM | POA: Diagnosis not present

## 2020-01-18 ENCOUNTER — Ambulatory Visit (INDEPENDENT_AMBULATORY_CARE_PROVIDER_SITE_OTHER): Payer: Medicare Other | Admitting: Neurology

## 2020-01-18 ENCOUNTER — Encounter: Payer: Self-pay | Admitting: Neurology

## 2020-01-18 ENCOUNTER — Ambulatory Visit (INDEPENDENT_AMBULATORY_CARE_PROVIDER_SITE_OTHER): Payer: Medicare Other

## 2020-01-18 ENCOUNTER — Ambulatory Visit: Payer: Medicare Other

## 2020-01-18 ENCOUNTER — Other Ambulatory Visit: Payer: Self-pay

## 2020-01-18 DIAGNOSIS — F411 Generalized anxiety disorder: Secondary | ICD-10-CM | POA: Diagnosis not present

## 2020-01-18 DIAGNOSIS — R269 Unspecified abnormalities of gait and mobility: Secondary | ICD-10-CM

## 2020-01-18 DIAGNOSIS — J449 Chronic obstructive pulmonary disease, unspecified: Secondary | ICD-10-CM | POA: Diagnosis not present

## 2020-01-18 DIAGNOSIS — E1142 Type 2 diabetes mellitus with diabetic polyneuropathy: Secondary | ICD-10-CM

## 2020-01-18 DIAGNOSIS — R3915 Urgency of urination: Secondary | ICD-10-CM

## 2020-01-18 DIAGNOSIS — I10 Essential (primary) hypertension: Secondary | ICD-10-CM | POA: Diagnosis not present

## 2020-01-18 DIAGNOSIS — G5621 Lesion of ulnar nerve, right upper limb: Secondary | ICD-10-CM

## 2020-01-18 DIAGNOSIS — E1143 Type 2 diabetes mellitus with diabetic autonomic (poly)neuropathy: Secondary | ICD-10-CM | POA: Diagnosis not present

## 2020-01-18 DIAGNOSIS — C61 Malignant neoplasm of prostate: Secondary | ICD-10-CM | POA: Diagnosis not present

## 2020-01-18 NOTE — Progress Notes (Signed)
Please refer to EMG and nerve conduction procedure note.  

## 2020-01-18 NOTE — Progress Notes (Signed)
[facility]    Nerve / Sites Muscle Latency Ref. Amplitude Ref. Rel Amp Segments Distance Velocity Ref. Area    ms ms mV mV %  cm m/s m/s mVms  R Ulnar - ADM     Wrist ADM 3.6 ?3.3 1.8 ?6.0 100 Wrist - ADM 7   7.1     B.Elbow ADM 10.0  1.0  55.1 B.Elbow - Wrist 27 42 ?49 5.2     A.Elbow ADM 12.3  1.0  97.8 A.Elbow - B.Elbow 10 43 ?49 3.8  R Peroneal - EDB     Ankle EDB 7.3 ?6.5 0.4 ?2.0 100 Ankle - EDB 9   1.4     Fib head EDB 19.1  0.6  151 Fib head - Ankle 37 31 ?44 2.3     Pop fossa EDB 22.4  0.5  91.1 Pop fossa - Fib head 10 30 ?44 2.1         Pop fossa - Ankle      L Peroneal - EDB     Ankle EDB NR ?6.5 NR ?2.0 NR Ankle - EDB 9   NR     Fib head EDB NR  NR  NR Fib head - Ankle 40 NR ?44 NR  R Tibial - AH     Ankle AH NR ?5.8 NR ?4.0 NR Ankle - AH 9 NR  NR  L Tibial - AH     Ankle AH NR ?5.8 NR ?4.0 NR Ankle - AH 9 NR  NR                SNC    Nerve / Sites Rec. Site Peak Lat Ref.  Amp Ref. Segments Distance    ms ms V V  cm  R Radial - Anatomical snuff box (Forearm)     Forearm Wrist 3.1 ?2.9 2 ?15 Forearm - Wrist 10  R Sural - Ankle (Calf)     Calf Ankle NR ?4.4 NR ?6 Calf - Ankle 14  L Sural - Ankle (Calf)     Calf Ankle NR ?4.4 NR ?6 Calf - Ankle 14  R Superficial peroneal - Ankle     Lat leg Ankle NR ?4.4 NR ?6 Lat leg - Ankle 14  L Superficial peroneal - Ankle     Lat leg Ankle NR ?4.4 NR ?6 Lat leg - Ankle 14  R Ulnar - Orthodromic, (Dig V, Mid palm)     Dig V Wrist NR ?3.1 NR ?5 Dig V - Wrist 44                 F  Wave    Nerve F Lat Ref.   ms ms  R Tibial - AH NR ?56.0  L Tibial - AH NR ?56.0  R Ulnar - ADM 52.3 ?32.0

## 2020-01-18 NOTE — Procedures (Signed)
     HISTORY:  Jonathan [name] is a 79 year old gentleman with history of diabetes with a severe diabetic peripheral neuropathy.  The patient has poor balance, he has a tendency to fall frequently.  He has been evaluated for the neuropathy.  NERVE CONDUCTION STUDIES:  Nerve conduction studies were performed on the right upper extremity.  The right ulnar nerve reveals prolongation of the distal motor latency with a low motor amplitude and slowing above below the elbow is seen.  The right ulnar sensory latency is unobtainable and the right radial sensory latency is slightly prolonged.  The right ulnar F-wave latency is prolonged.   Nerve conduction studies were performed on both lower extremities.  The studies involving the left peroneal nerve, and the posterior tibial nerves bilaterally were unobtainable.  The right peroneal nerve revealed a prolonged distal motor latency with a low motor amplitude and slowing seen above and below the knee.  The sensory latencies for the sural and peroneal nerves bilaterally were unobtainable.  The F-wave latencies for the posterior tibial nerves were unobtainable bilaterally.  EMG STUDIES:  EMG study was performed on the right lower extremity:  The tibialis anterior muscle reveals 2 to 6K motor units with decreased recruitment. No fibrillations or positive waves were seen. The peroneus tertius muscle reveals 2 to 5K motor units with significantly decreased recruitment. 2+ fibrillations and positive waves were seen. The medial gastrocnemius muscle reveals 1 to 4K motor units with decreased recruitment. No fibrillations or positive waves were seen. The vastus lateralis muscle reveals 2 to 4K motor units with full recruitment. No fibrillations or positive waves were seen. The iliopsoas muscle reveals 2 to 4K motor units with full recruitment. No fibrillations or positive waves were seen. The biceps femoris muscle (long head) reveals 2 to 4K motor units with full  recruitment. No fibrillations or positive waves were seen. The lumbosacral paraspinal muscles were tested at 3 levels, and revealed no abnormalities of insertional activity at all 3 levels tested. There was fair relaxation.   IMPRESSION:  Nerve conduction studies done on the right upper extremity and both lower extremities shows evidence of a severe primarily axonal peripheral neuropathy.  EMG evaluation of the right lower extremity shows acute and chronic neuropathic denervation below the knee consistent with the diagnosis of peripheral neuropathy, no clear evidence of an overlying lumbosacral radiculopathy was seen.  Jill Alexanders MD 01/18/2020 2:27 PM  [facility] [address] [place], [place] [address]  Phone [phone] Fax [phone]

## 2020-01-18 NOTE — Progress Notes (Signed)
PTNS  Session # 2 of 12  Health & Social Factors: NA Caffeine: 1 Alcohol: 0 Daytime voids #per day: 4 Night-time voids #per night: 4 Urgency: Strong, All time Incontinence Episodes #per day: 4 Ankle used: Lft Treatment Setting: 14 Feeling/ Response: Pt thinks he is doing a little better Comments: NA  Performed By: LLVDIXVE,ZBMZT,AEW   Follow Up: 1 week

## 2020-01-19 ENCOUNTER — Ambulatory Visit: Payer: Medicare Other

## 2020-01-22 ENCOUNTER — Ambulatory Visit (HOSPITAL_COMMUNITY): Payer: Medicare Other

## 2020-01-25 DIAGNOSIS — E1142 Type 2 diabetes mellitus with diabetic polyneuropathy: Secondary | ICD-10-CM | POA: Diagnosis not present

## 2020-01-25 DIAGNOSIS — E1143 Type 2 diabetes mellitus with diabetic autonomic (poly)neuropathy: Secondary | ICD-10-CM | POA: Diagnosis not present

## 2020-01-25 DIAGNOSIS — J449 Chronic obstructive pulmonary disease, unspecified: Secondary | ICD-10-CM | POA: Diagnosis not present

## 2020-01-25 DIAGNOSIS — C61 Malignant neoplasm of prostate: Secondary | ICD-10-CM | POA: Diagnosis not present

## 2020-01-25 DIAGNOSIS — F411 Generalized anxiety disorder: Secondary | ICD-10-CM | POA: Diagnosis not present

## 2020-01-25 DIAGNOSIS — I10 Essential (primary) hypertension: Secondary | ICD-10-CM | POA: Diagnosis not present

## 2020-01-26 ENCOUNTER — Other Ambulatory Visit: Payer: Self-pay

## 2020-01-26 ENCOUNTER — Ambulatory Visit (INDEPENDENT_AMBULATORY_CARE_PROVIDER_SITE_OTHER): Payer: Medicare Other

## 2020-01-26 DIAGNOSIS — R3915 Urgency of urination: Secondary | ICD-10-CM | POA: Diagnosis not present

## 2020-01-26 NOTE — Progress Notes (Signed)
PTNS  Session # 3  Health & Social Factors: diabetic Caffeine: 1 cup of coffee Alcohol: none Daytime voids #per day: 8 Night-time voids #per night: 4-5 Urgency: yes Incontinence Episodes #per day: 2-4 Ankle used: left Treatment Setting: toe reflex noted @6  Pt tolerated setting @10  Feeling/ Response: none Comments: none  Performed By: Mohamad Bruso,LPN  Assistant: none  Follow Up: Keep scheduled NV

## 2020-01-27 ENCOUNTER — Ambulatory Visit (HOSPITAL_COMMUNITY)
Admission: RE | Admit: 2020-01-27 | Discharge: 2020-01-27 | Disposition: A | Discharge status: Home / Self Care | Payer: Medicare Other | Source: Ambulatory Visit | Attending: Family Medicine | Admitting: Family Medicine

## 2020-01-27 DIAGNOSIS — Z85118 Personal history of other malignant neoplasm of bronchus and lung: Secondary | ICD-10-CM | POA: Diagnosis not present

## 2020-01-27 DIAGNOSIS — J984 Other disorders of lung: Secondary | ICD-10-CM | POA: Diagnosis not present

## 2020-01-27 DIAGNOSIS — Z77098 Contact with and (suspected) exposure to other hazardous, chiefly nonmedicinal, chemicals: Secondary | ICD-10-CM | POA: Insufficient documentation

## 2020-01-27 DIAGNOSIS — I2510 Atherosclerotic heart disease of native coronary artery without angina pectoris: Secondary | ICD-10-CM | POA: Diagnosis not present

## 2020-01-27 DIAGNOSIS — I700 Atherosclerosis of aorta: Secondary | ICD-10-CM | POA: Diagnosis not present

## 2020-01-28 ENCOUNTER — Other Ambulatory Visit: Payer: Medicare Other

## 2020-02-01 ENCOUNTER — Other Ambulatory Visit: Payer: Self-pay

## 2020-02-01 ENCOUNTER — Ambulatory Visit (INDEPENDENT_AMBULATORY_CARE_PROVIDER_SITE_OTHER): Payer: Medicare Other

## 2020-02-01 DIAGNOSIS — J449 Chronic obstructive pulmonary disease, unspecified: Secondary | ICD-10-CM | POA: Diagnosis not present

## 2020-02-01 DIAGNOSIS — E1142 Type 2 diabetes mellitus with diabetic polyneuropathy: Secondary | ICD-10-CM | POA: Diagnosis not present

## 2020-02-01 DIAGNOSIS — R339 Retention of urine, unspecified: Secondary | ICD-10-CM | POA: Diagnosis not present

## 2020-02-01 DIAGNOSIS — E1143 Type 2 diabetes mellitus with diabetic autonomic (poly)neuropathy: Secondary | ICD-10-CM | POA: Diagnosis not present

## 2020-02-01 DIAGNOSIS — R300 Dysuria: Secondary | ICD-10-CM | POA: Diagnosis not present

## 2020-02-01 DIAGNOSIS — F411 Generalized anxiety disorder: Secondary | ICD-10-CM | POA: Diagnosis not present

## 2020-02-01 DIAGNOSIS — R3915 Urgency of urination: Secondary | ICD-10-CM | POA: Diagnosis not present

## 2020-02-01 DIAGNOSIS — C61 Malignant neoplasm of prostate: Secondary | ICD-10-CM | POA: Diagnosis not present

## 2020-02-01 DIAGNOSIS — I10 Essential (primary) hypertension: Secondary | ICD-10-CM | POA: Diagnosis not present

## 2020-02-01 NOTE — Progress Notes (Signed)
PTNS  Session # 4 of 12  Health & Social Factors: NA Caffeine: 1 Alcohol: 0 Daytime voids #per day: 5 to 6 Night-time voids #per night: 6 Urgency: Yes Incontinence Episodes #per day: 4 Ankle used: Left (each time) Treatment Setting: 7 Feeling/ Response: Strong  Comments: Says he can tell a difference. His urgency is a little less and he isn't wearing as many pads at night.  Performed By: Antionette Char, Olinda Nola,LPN  Assistant: Belenda Cruise, [place],LPN  Follow Up:  1 week

## 2020-02-02 DIAGNOSIS — Z85118 Personal history of other malignant neoplasm of bronchus and lung: Secondary | ICD-10-CM | POA: Diagnosis not present

## 2020-02-02 DIAGNOSIS — R350 Frequency of micturition: Secondary | ICD-10-CM | POA: Diagnosis not present

## 2020-02-02 DIAGNOSIS — C61 Malignant neoplasm of prostate: Secondary | ICD-10-CM | POA: Diagnosis not present

## 2020-02-02 DIAGNOSIS — J701 Chronic and other pulmonary manifestations due to radiation: Secondary | ICD-10-CM | POA: Diagnosis not present

## 2020-02-02 DIAGNOSIS — R32 Unspecified urinary incontinence: Secondary | ICD-10-CM | POA: Diagnosis not present

## 2020-02-02 DIAGNOSIS — E1143 Type 2 diabetes mellitus with diabetic autonomic (poly)neuropathy: Secondary | ICD-10-CM | POA: Diagnosis not present

## 2020-02-02 DIAGNOSIS — R42 Dizziness and giddiness: Secondary | ICD-10-CM | POA: Diagnosis not present

## 2020-02-02 DIAGNOSIS — I10 Essential (primary) hypertension: Secondary | ICD-10-CM | POA: Diagnosis not present

## 2020-02-05 ENCOUNTER — Ambulatory Visit: Payer: Medicare Other | Admitting: Urology

## 2020-02-08 DIAGNOSIS — R350 Frequency of micturition: Secondary | ICD-10-CM | POA: Diagnosis not present

## 2020-02-08 DIAGNOSIS — C61 Malignant neoplasm of prostate: Secondary | ICD-10-CM | POA: Diagnosis not present

## 2020-02-08 DIAGNOSIS — J701 Chronic and other pulmonary manifestations due to radiation: Secondary | ICD-10-CM | POA: Diagnosis not present

## 2020-02-08 DIAGNOSIS — E1143 Type 2 diabetes mellitus with diabetic autonomic (poly)neuropathy: Secondary | ICD-10-CM | POA: Diagnosis not present

## 2020-02-08 DIAGNOSIS — I10 Essential (primary) hypertension: Secondary | ICD-10-CM | POA: Diagnosis not present

## 2020-02-08 DIAGNOSIS — R32 Unspecified urinary incontinence: Secondary | ICD-10-CM | POA: Diagnosis not present

## 2020-02-08 DIAGNOSIS — Z85118 Personal history of other malignant neoplasm of bronchus and lung: Secondary | ICD-10-CM | POA: Diagnosis not present

## 2020-02-08 DIAGNOSIS — Z6827 Body mass index (BMI) 27.0-27.9, adult: Secondary | ICD-10-CM | POA: Diagnosis not present

## 2020-02-09 ENCOUNTER — Other Ambulatory Visit: Payer: Self-pay

## 2020-02-09 ENCOUNTER — Ambulatory Visit (INDEPENDENT_AMBULATORY_CARE_PROVIDER_SITE_OTHER): Payer: Medicare Other

## 2020-02-09 DIAGNOSIS — R350 Frequency of micturition: Secondary | ICD-10-CM | POA: Diagnosis not present

## 2020-02-09 NOTE — Progress Notes (Signed)
PTNS  Session # 5  Health & Social Factors: diabetic, take fluid pills prn Caffeine: none Alcohol: none Daytime voids #per day: 6 Night-time voids #per night: 4 Urgency: yes Incontinence Episodes #per day: 3 Ankle used: right Treatment Setting: 10 Feeling/ Response: none Comments: none  Performed By: Durenda Guthrie, LPN  Assistant: none  Follow Up: Keep next scheduled NV

## 2020-02-15 ENCOUNTER — Ambulatory Visit (INDEPENDENT_AMBULATORY_CARE_PROVIDER_SITE_OTHER): Payer: Medicare Other

## 2020-02-15 ENCOUNTER — Other Ambulatory Visit: Payer: Self-pay

## 2020-02-15 DIAGNOSIS — R3915 Urgency of urination: Secondary | ICD-10-CM

## 2020-02-15 DIAGNOSIS — R351 Nocturia: Secondary | ICD-10-CM

## 2020-02-15 NOTE — Progress Notes (Signed)
PTNS  Session # 6 of 12  Health & Social Factors: N/A Caffeine: 1 Alcohol: 0 Daytime voids #per day: 6 Night-time voids #per night: 4 Urgency: Same Incontinence Episodes #per day: 0 Ankle used: Left Treatment Setting: 7 Feeling/ Response: Strong sensation at 7 Comments: Pt says he is wearing less pads. Only 2 in day time and 1 at night.  Performed By: Valentina Lucks, LPN   Follow Up: 1 wk

## 2020-02-20 DIAGNOSIS — J069 Acute upper respiratory infection, unspecified: Secondary | ICD-10-CM | POA: Diagnosis not present

## 2020-02-20 DIAGNOSIS — Z20828 Contact with and (suspected) exposure to other viral communicable diseases: Secondary | ICD-10-CM | POA: Diagnosis not present

## 2020-02-23 ENCOUNTER — Ambulatory Visit: Payer: Medicare Other

## 2020-02-25 ENCOUNTER — Other Ambulatory Visit: Payer: Self-pay

## 2020-02-25 ENCOUNTER — Ambulatory Visit (INDEPENDENT_AMBULATORY_CARE_PROVIDER_SITE_OTHER): Payer: Medicare Other

## 2020-02-25 DIAGNOSIS — R350 Frequency of micturition: Secondary | ICD-10-CM | POA: Diagnosis not present

## 2020-02-25 NOTE — Progress Notes (Signed)
PTNS  Session # 7  Health & Social Factors: diabetic Caffeine: 1 cup a day Alcohol: none Daytime voids #per day: 10 Night-time voids #per night: 6 Urgency: yes Incontinence Episodes #per day: 4 Ankle used: left Treatment Setting: 3 Feeling/ Response: positive senstion Comments: none  Performed By: Apollo Timothy,lpn  Assistant: none  Follow Up: keep next scheduled NV

## 2020-03-01 ENCOUNTER — Ambulatory Visit (INDEPENDENT_AMBULATORY_CARE_PROVIDER_SITE_OTHER): Payer: Medicare Other

## 2020-03-01 ENCOUNTER — Ambulatory Visit: Payer: Medicare Other

## 2020-03-01 DIAGNOSIS — R350 Frequency of micturition: Secondary | ICD-10-CM | POA: Diagnosis not present

## 2020-03-01 NOTE — Progress Notes (Signed)
PTNS  Session # 8  Health & Social Factors: none Caffeine: 1 cup per day Alcohol: none Daytime voids #per day: 6 Night-time voids #per night: 5 Urgency: yes Incontinence Episodes #per day: 4 Ankle used: left Treatment Setting: 10 Feeling/ Response: positive sensation  Comments: none  Performed By: Durenda Guthrie, LPN  Assistant: none  Follow Up: keep next scheduled NV

## 2020-03-02 DIAGNOSIS — E1143 Type 2 diabetes mellitus with diabetic autonomic (poly)neuropathy: Secondary | ICD-10-CM | POA: Diagnosis not present

## 2020-03-02 DIAGNOSIS — I10 Essential (primary) hypertension: Secondary | ICD-10-CM | POA: Diagnosis not present

## 2020-03-02 DIAGNOSIS — R350 Frequency of micturition: Secondary | ICD-10-CM | POA: Diagnosis not present

## 2020-03-02 DIAGNOSIS — F411 Generalized anxiety disorder: Secondary | ICD-10-CM | POA: Diagnosis not present

## 2020-03-02 DIAGNOSIS — Z85118 Personal history of other malignant neoplasm of bronchus and lung: Secondary | ICD-10-CM | POA: Diagnosis not present

## 2020-03-02 DIAGNOSIS — K5901 Slow transit constipation: Secondary | ICD-10-CM | POA: Diagnosis not present

## 2020-03-02 DIAGNOSIS — C61 Malignant neoplasm of prostate: Secondary | ICD-10-CM | POA: Diagnosis not present

## 2020-03-02 DIAGNOSIS — R32 Unspecified urinary incontinence: Secondary | ICD-10-CM | POA: Diagnosis not present

## 2020-03-02 DIAGNOSIS — J701 Chronic and other pulmonary manifestations due to radiation: Secondary | ICD-10-CM | POA: Diagnosis not present

## 2020-03-02 DIAGNOSIS — Z6827 Body mass index (BMI) 27.0-27.9, adult: Secondary | ICD-10-CM | POA: Diagnosis not present

## 2020-03-02 DIAGNOSIS — R42 Dizziness and giddiness: Secondary | ICD-10-CM | POA: Diagnosis not present

## 2020-03-03 ENCOUNTER — Other Ambulatory Visit: Payer: Medicare Other

## 2020-03-04 ENCOUNTER — Ambulatory Visit: Payer: Medicare Other

## 2020-03-07 ENCOUNTER — Other Ambulatory Visit: Payer: Self-pay

## 2020-03-07 ENCOUNTER — Ambulatory Visit (INDEPENDENT_AMBULATORY_CARE_PROVIDER_SITE_OTHER): Payer: Medicare Other

## 2020-03-07 DIAGNOSIS — R3915 Urgency of urination: Secondary | ICD-10-CM

## 2020-03-07 DIAGNOSIS — R300 Dysuria: Secondary | ICD-10-CM | POA: Diagnosis not present

## 2020-03-07 DIAGNOSIS — N3941 Urge incontinence: Secondary | ICD-10-CM | POA: Diagnosis not present

## 2020-03-07 NOTE — Progress Notes (Signed)
PTNS  Session #  9 of 12  Health & Social Factors: NA Caffeine: Pt states 1 gal Alcohol: No Daytime voids #per day:  All time Night-time voids #per night: 5 Urgency: YES Incontinence Episodes #per day: Yes Ankle used: Left Treatment Setting: 8 Feeling/ Response: None Comments: None  Performed By: Valentina Lucks, LPN   Follow Up:  1 wk

## 2020-03-09 ENCOUNTER — Encounter: Payer: Self-pay | Admitting: Internal Medicine

## 2020-03-09 ENCOUNTER — Other Ambulatory Visit: Payer: Self-pay

## 2020-03-09 ENCOUNTER — Ambulatory Visit (INDEPENDENT_AMBULATORY_CARE_PROVIDER_SITE_OTHER): Payer: Medicare Other | Admitting: Internal Medicine

## 2020-03-09 ENCOUNTER — Other Ambulatory Visit: Payer: Self-pay | Admitting: Internal Medicine

## 2020-03-09 DIAGNOSIS — J449 Chronic obstructive pulmonary disease, unspecified: Secondary | ICD-10-CM | POA: Diagnosis not present

## 2020-03-09 NOTE — Progress Notes (Signed)
Jonathan [name], male    DOB: 12/23/1940   MRN: [id number]   Brief patient profile:  86 yobm from [place] smoking 2016  dx Lung ca 2017 > Dr Jonathan [name] 07/07/15 Right video-assisted thoracoscopy, thoracoscopic right upper lobectomy, mediastinal lymph node dissection, and On-Q local anesthetic catheter placement by Dr. Roxan [name] on 07/04/2015 > RT in [place] c/b RT pneumonitis "on prednisone ever since "  referred to pulmonary clinic in [place]  03/09/2020 by Dr Jonathan Paschal PA though says he has PCP at [facility] and confused with meds and chief complaint of Staggering     History of Present Illness  03/09/2020  Pulmonary/ 1st office eval/ Jonathan [name] / [facility] [name] [facility] / prn spiriva and prn prednisone but last took pred one week prior to Jonathan [name]   Chief Complaint  Patient presents with  . Pulmonary Consult    Referred by Jonathan Bal, PA for eval of pulmonary fibrosis. Pt c/o "staggering", dizziness and SOB for the past year. He has hx of lung cancer and prostate cancer.    Dyspnea: maybe 2 aisles leaning on a cart / R ankle and balance limiting / can only play 3 holes of golf then breathing gives out  Cough: none  Sleep: flat bed pillows maybe 30 degrees  SABA use: spiriva prn   No obvious day to day or daytime variability or assoc excess/ purulent sputum or mucus plugs or hemoptysis or cp or chest tightness, subjective wheeze or overt sinus or hb symptoms.   Sleeping  without nocturnal  or early am exacerbation  of respiratory  c/o's or need for noct saba. Also denies any obvious fluctuation of symptoms with weather or environmental changes or other aggravating or alleviating factors except as outlined above   No unusual exposure hx or h/o childhood pna/ asthma or knowledge of premature birth.  Current Allergies, Complete Past Medical History, Past Surgical History, Family History, and Social History were reviewed in [employer] record.  ROS  The following are not active  complaints unless bolded Hoarseness, sore throat, dysphagia, dental problems, itching, sneezing,  nasal congestion or discharge of excess mucus or purulent secretions, ear ache,   fever, chills, sweats, unintended wt loss or wt gain, classically pleuritic or exertional cp,  orthopnea pnd or arm/hand swelling  or leg swelling, presyncope, palpitations, abdominal pain, anorexia, nausea, vomiting, diarrhea  or change in bowel habits or change in bladder habits, change in stools or change in urine, dysuria, hematuria,  rash, arthralgias, visual complaints, headache, numbness, weakness or ataxia or problems with walking or coordination,  change in mood or  memory.             Past Medical History:  Diagnosis Date  . Acquired bladder diverticulum    12/ 2012  resection diverticulum done at [facility] in [place], [place]  . Age-related cataract of right eye    scheduled for catarat extraction 09-12-2017  . Agent orange exposure   . Aneurysm of infrarenal abdominal aorta ([place])    first dx 2017--- last CT 06-11-2017  measures 3.5cm  . Chronic constipation   . COPD with emphysema ([place])    06--12-2017  per pt no symptoms since Feb 2019  . Depression    PTSD  . Diabetes mellitus type 2, diet-controlled ([place])   . Diabetic neuropathy ([place])   . Dyslipidemia   . Dyspnea on exertion   . Erectile dysfunction   . Feeling of incomplete bladder emptying   . Gait abnormality  01/06/2020  . GERD (gastroesophageal reflux disease)   . Gout    09-02-2017 last flare up 3 months ago  . Hiatal hernia   . History of atrial fibrillation    episode post op right lung lobectomy 07-04-2015  . History of bladder stone   . History of colon polyps   . History of DVT of lower extremity    03/ 2019  bilateral lower extremity (superficial) ---  per treated w/ oral medication   . History of gastritis   . History of kidney stones   . History of radiation therapy 09-14-2015  to 09-21-2015   left upper lung nodule -- 54Gy in 3  fractions (18 Gy per fraction)  . Hyperplasia of prostate with lower urinary tract symptoms (LUTS)   . Hypertension   . Lumbar spinal stenosis   . Nephrolithiasis   . Neurogenic bladder   . Osteoarthritis    ankle, hands  . Osteoporosis   . Prostate cancer [facility]) urologist-  dr wrenn/  oncologist-  dr Tammi Klippel   dx 05-24-2017-- Stage T2b,  Gleason 4+4,  PSA 22.41,  vol 38cc--- started ADT 04/ 2019,  plan external radiation therapy  . Radiation fibrosis of lung ([place])    hx left upper long nodule SBRT 06/ 2017  . Squamous cell carcinoma of both lungs ([place]) last CT in epic dated 06-26-2017 no recurrence   dx 03/ 2017  non-small cell SCC via bronchoscopy w/ bx's by dr Jonathan [name]---  s/p  right VATS w/ right lobectomy and node dissection's 07-04-2015 /  pt had SBRT to left upper nodule Stage 1 ([facility]) completed 09-21-2015  . Wears dentures    bottom  . Wears glasses     Outpatient Medications Prior to Visit - - NOTE:   Unable to verify as accurately reflecting what pt takes      Medication Sig Dispense Refill  . atenolol-chlorthalidone (TENORETIC) 50-25 MG tablet Take 1 tablet by mouth at bedtime.     . ciclopirox (PENLAC) 8 % solution Apply topically at bedtime. Apply over nail and surrounding skin. Apply daily over previous coat. After seven (7) days, may remove with alcohol and continue cycle.    . diclofenac Sodium (VOLTAREN) 1 % GEL Apply 2 g topically 4 (four) times daily as needed (pain).    [name] escitalopram (LEXAPRO) 5 MG tablet Take 5 mg by mouth daily.    [name] FREESTYLE LITE test strip     . gabapentin (NEURONTIN) 400 MG capsule Take 400 mg by mouth at bedtime.     . hydroquinone 4 % cream Apply topically 2 (two) times daily.    [name] ibuprofen (ADVIL) 600 MG tablet     . Incontinence Supply Disposable (FITTED BRIEFS MAXIMUM XL) MISC Use briefs as needed daily for incontinence 1 each 11  . Lancets (FREESTYLE) lancets     . LANTUS SOLOSTAR 100 UNIT/ML Solostar Pen Inject 2.5  Units into the skin daily.    [name] LINZESS 72 MCG capsule     . metFORMIN (GLUCOPHAGE) 500 MG tablet metformin 500 mg tablet    . metoCLOPramide (REGLAN) 5 MG tablet Take 5 mg by mouth in the morning, at noon, and at bedtime.    . mupirocin cream (BACTROBAN) 2 % Apply 1 application topically 2 (two) times daily as needed (wound care).    . mupirocin ointment (BACTROBAN) 2 % Apply topically.    . nystatin ointment (MYCOSTATIN)     . nystatin-triamcinolone ointment (MYCOLOG) Apply 1  application topically 2 (two) times daily. To groins 30 g 11  . oxybutynin (DITROPAN) 5 MG tablet Take 1 tablet (5 mg total) by mouth 3 (three) times daily. 270 tablet 3  . Oxycodone HCl 20 MG TABS Take 5 mg by mouth in the morning, at noon, in the evening, and at bedtime.   0  . pantoprazole (PROTONIX) 40 MG tablet Take 1 tablet (40 mg total) by mouth 2 (two) times daily before a meal. 60 tablet 2  . potassium chloride (KLOR-CON) 10 MEQ tablet Take 10 mEq by mouth daily as needed.    . predniSONE (DELTASONE) 20 MG tablet Take 20 mg by mouth at bedtime.    . psyllium (METAMUCIL SMOOTH TEXTURE) 58.6 % powder Take 1 packet by mouth daily. (Patient taking differently: Take 1 packet by mouth every other day.)    . tadalafil (CIALIS) 5 MG tablet Take 1 tablet (5 mg total) by mouth daily as needed for erectile dysfunction. 30 tablet 11  . terbinafine (LAMISIL) 250 MG tablet Take 250 mg by mouth daily.    . timolol (TIMOPTIC) 0.5 % ophthalmic solution Place 1 drop into both eyes 2 (two) times daily as needed (eye pressure).    . ciprofloxacin (CIPRO) 250 MG tablet Take 1 tablet (250 mg total) by mouth every 12 (twelve) hours. 10 tablet 0  . sildenafil (VIAGRA) 100 MG tablet Take 100 mg by mouth daily as needed for erectile dysfunction.     . sulfamethoxazole-trimethoprim (BACTRIM DS) 800-160 MG tablet Take 1 tablet by mouth 2 (two) times daily. 14 tablet 0   No facility-administered medications prior to visit.     Objective:      BP (!) 148/92 (BP Location: Left Arm, Cuff Size: Normal)   Pulse 65   Temp (!) 97 F (36.1 C) (Temporal)   Ht 6\' 8"  (2.032 m)   Wt 255 lb (115.7 kg)   SpO2 100% Comment: on RA  BMI 28.01 kg/m   SpO2: 100 % (on RA)   amb elderly  bm nad but  unsteady on his feet leans against the wall on walking study - did not bring rollator or meds to office   HEENT : pt wearing mask not removed for exam due to covid - 19 concerns.   NECK :  without JVD/Nodes/TM/ nl carotid upstrokes bilaterally   LUNGS: no acc muscle use,  Min barrel  contour chest wall with bilateral  slightly decreased bs s audible wheeze and  without cough on insp or exp maneuvers and min  Hyperresonant  to  percussion bilaterally     CV:  RRR  no s3 or murmur or increase in P2, and no edema   ABD:  soft and nontender with pos end  insp Hoover's  in the supine position. No bruits or organomegaly appreciated, bowel sounds nl  MS:   ext warm without deformities, calf tenderness, cyanosis or clubbing No obvious joint restrictions   SKIN: warm and dry without lesions    NEURO:  alert, approp, nl sensorium - see gait above         Lab Results  Component Value Date   ESRSEDRATE 20 01/06/2020       I personally reviewed images and agree with radiology impression as follows:   Chest CTa  01/27/20 1. Irregular density is again noted in the left upper lobe most consistent with radiation fibrosis. However, slightly increased thickening and platelike solid component is noted which most likely represents progressive radiation  fibrosis, but continued CT follow-up is recommended to rule out recurrent tumor. 2. Stable scarring and postoperative change is noted medially and anteriorly in the right upper lobe. 3. Minimal coronary artery calcifications are noted. 4. Aortic atherosclerosis.     Assessment   COPD GOLD 2 s/p RULobectomy/ RT pneumonitis  Quit smoking 2016 PFT's  06/01/15 FEV1 2.50 (60 % ) ratio 0.61  p 2  % improvement from saba p ? prior to study with DLCO  24.36 (53%) corrects to 3.51 (69%)  for alv volume and FV curve minimal exp concavity and truncated insp s plateau and ERV  32%   -  03/09/2020   Walked RA  approx   100 ft  @ slow/staggering paceholding on to wall   stopped due to  Ataxia, not sob     As I explained to this patient in detail:  although there may be copd present as well as some mild RT related PF  They may not be clinically relevant:  They donot appear to be limiting activity tolerance any more than a set of worn tires limits someone from driving a car  around a parking lot.  A new set of Michelins might look good but would have no perceived impact on the performance of the car and would not be worth the cost.  That is to say:   this pt is so limited by neuropathy/deconditioning I don't recommend aggressive pulmonary rx at this point unless limiting symptoms arise or acute exacerbations become as issue, neither of which is the case now.   spriva can be restarted if desired since already has it but is not a prn and should be though of as more analogous to high octane fuel - won't be able to tell it's helping if not more aerobically active than what I witnessed today.  >>> f/u with pfts/ avoid potential fibrotic agents like amio/macrodantin and return with all meds in hand using a trust but verify approach to confirm accurate Medication  Reconciliation The principal here is that until we are certain that the  patients are doing what we've asked, it makes no sense to ask them to do more.          Each maintenance medication was reviewed in detail including emphasizing most importantly the difference between maintenance and prns and under what circumstances the prns are to be triggered using an action plan format where appropriate.  Total time for H and P, chart review, counseling, teaching device and generating customized AVS unique to this office visit / charting = 55 min                Christinia Gully, MD 03/09/2020

## 2020-03-09 NOTE — Patient Instructions (Addendum)
Make sure you check your oxygen saturation at your highest level of activity to be sure it stays over 90% and keep track of it at least once a week, more often if breathing getting worse, and let me know if losing ground.   Ok to leave off the prednisone and the spiriva (like high octane fuel - if you're going to take it, do it perfectly.     Avoid macrodantin, macrobid and nitrofurantoin for your bladder    Please schedule a follow up office visit in 6 weeks, call sooner if needed with all medications /inhalers/ solutions in hand so we can verify exactly what you are taking. This includes all medications from all doctors and over the counters   >>>>   PFTS on return

## 2020-03-09 NOTE — Assessment & Plan Note (Addendum)
Quit smoking 2016 PFT's  06/01/15 FEV1 2.50 (60 % ) ratio 0.61  p 2 % improvement from saba p ? prior to study with DLCO  24.36 (53%) corrects to 3.51 (69%)  for alv volume and FV curve minimal exp concavity and truncated insp s plateau and ERV  32%   -  03/09/2020   Walked RA  approx   100 ft  @ slow/staggering paceholding on to wall   stopped due to  Ataxia, not sob     As I explained to this patient in detail:  although there may be copd present as well as some mild RT related PF  They may not be clinically relevant:  They donot appear to be limiting activity tolerance any more than a set of worn tires limits someone from driving a car  around a parking lot.  A new set of Michelins might look good but would have no perceived impact on the performance of the car and would not be worth the cost.  That is to say:   this pt is so limited by neuropathy/deconditioning I don't recommend aggressive pulmonary rx at this point unless limiting symptoms arise or acute exacerbations become as issue, neither of which is the case now.   spriva can be restarted if desired since already has it but is not a prn and should be though of as more analogous to high octane fuel - won't be able to tell it's helping if not more aerobically active than what I witnessed today.  >>> f/u with pfts/ avoid potential fibrotic agents like amio/macrodantin and return with all meds in hand using a trust but verify approach to confirm accurate Medication  Reconciliation The principal here is that until we are certain that the  patients are doing what we've asked, it makes no sense to ask them to do more.          Each maintenance medication was reviewed in detail including emphasizing most importantly the difference between maintenance and prns and under what circumstances the prns are to be triggered using an action plan format where appropriate.  Total time for H and P, chart review, counseling, directly observing portions of  ambulatory 02 saturation study/ and generating customized AVS unique to this office visit / charting = 55 min

## 2020-03-11 ENCOUNTER — Ambulatory Visit: Payer: Medicare Other | Admitting: Urology

## 2020-03-14 DIAGNOSIS — F411 Generalized anxiety disorder: Secondary | ICD-10-CM | POA: Diagnosis not present

## 2020-03-14 DIAGNOSIS — I10 Essential (primary) hypertension: Secondary | ICD-10-CM | POA: Diagnosis not present

## 2020-03-14 DIAGNOSIS — C61 Malignant neoplasm of prostate: Secondary | ICD-10-CM | POA: Diagnosis not present

## 2020-03-14 DIAGNOSIS — K5901 Slow transit constipation: Secondary | ICD-10-CM | POA: Diagnosis not present

## 2020-03-14 DIAGNOSIS — J701 Chronic and other pulmonary manifestations due to radiation: Secondary | ICD-10-CM | POA: Diagnosis not present

## 2020-03-14 DIAGNOSIS — R350 Frequency of micturition: Secondary | ICD-10-CM | POA: Diagnosis not present

## 2020-03-14 DIAGNOSIS — E1143 Type 2 diabetes mellitus with diabetic autonomic (poly)neuropathy: Secondary | ICD-10-CM | POA: Diagnosis not present

## 2020-03-14 DIAGNOSIS — Z6828 Body mass index (BMI) 28.0-28.9, adult: Secondary | ICD-10-CM | POA: Diagnosis not present

## 2020-03-16 ENCOUNTER — Other Ambulatory Visit: Payer: Self-pay

## 2020-03-16 ENCOUNTER — Ambulatory Visit (INDEPENDENT_AMBULATORY_CARE_PROVIDER_SITE_OTHER): Payer: Medicare Other

## 2020-03-16 DIAGNOSIS — R300 Dysuria: Secondary | ICD-10-CM | POA: Diagnosis not present

## 2020-03-16 DIAGNOSIS — R3915 Urgency of urination: Secondary | ICD-10-CM

## 2020-03-16 DIAGNOSIS — R3981 Functional urinary incontinence: Secondary | ICD-10-CM | POA: Diagnosis not present

## 2020-03-16 NOTE — Progress Notes (Signed)
PTNS  Session # 10 of 12  Health & Social Factors: NA Caffeine: 1 cup  Alcohol: None Daytime voids #per day: 6 Night-time voids #per night: 5 Urgency: yes Incontinence Episodes #per day: 5 Ankle used: left Treatment Setting: 7 Feeling/ Response: None Comments: None  Performed By: Antionette Char, Viriginia Amendola,LPN  Assistant: Hope,LPN  Follow Up: 1 week

## 2020-03-17 ENCOUNTER — Institutional Professional Consult (permissible substitution): Payer: Medicare Other | Admitting: Neurology

## 2020-03-19 DIAGNOSIS — R5381 Other malaise: Secondary | ICD-10-CM | POA: Diagnosis not present

## 2020-03-19 DIAGNOSIS — I714 Abdominal aortic aneurysm, without rupture: Secondary | ICD-10-CM | POA: Diagnosis not present

## 2020-03-19 DIAGNOSIS — K449 Diaphragmatic hernia without obstruction or gangrene: Secondary | ICD-10-CM | POA: Diagnosis not present

## 2020-03-19 DIAGNOSIS — K219 Gastro-esophageal reflux disease without esophagitis: Secondary | ICD-10-CM | POA: Diagnosis not present

## 2020-03-19 DIAGNOSIS — E1122 Type 2 diabetes mellitus with diabetic chronic kidney disease: Secondary | ICD-10-CM | POA: Diagnosis not present

## 2020-03-19 DIAGNOSIS — Z9049 Acquired absence of other specified parts of digestive tract: Secondary | ICD-10-CM | POA: Diagnosis not present

## 2020-03-19 DIAGNOSIS — K6289 Other specified diseases of anus and rectum: Secondary | ICD-10-CM | POA: Diagnosis not present

## 2020-03-19 DIAGNOSIS — N200 Calculus of kidney: Secondary | ICD-10-CM | POA: Diagnosis not present

## 2020-03-19 DIAGNOSIS — R1084 Generalized abdominal pain: Secondary | ICD-10-CM | POA: Diagnosis not present

## 2020-03-19 DIAGNOSIS — R52 Pain, unspecified: Secondary | ICD-10-CM | POA: Diagnosis not present

## 2020-03-19 DIAGNOSIS — K760 Fatty (change of) liver, not elsewhere classified: Secondary | ICD-10-CM | POA: Diagnosis not present

## 2020-03-19 DIAGNOSIS — I700 Atherosclerosis of aorta: Secondary | ICD-10-CM | POA: Diagnosis not present

## 2020-03-19 DIAGNOSIS — Z8546 Personal history of malignant neoplasm of prostate: Secondary | ICD-10-CM | POA: Diagnosis not present

## 2020-03-19 DIAGNOSIS — I10 Essential (primary) hypertension: Secondary | ICD-10-CM | POA: Diagnosis not present

## 2020-03-19 DIAGNOSIS — K5900 Constipation, unspecified: Secondary | ICD-10-CM | POA: Diagnosis not present

## 2020-03-21 ENCOUNTER — Ambulatory Visit: Payer: Medicare Other

## 2020-03-22 ENCOUNTER — Ambulatory Visit: Payer: Medicare Other

## 2020-03-24 ENCOUNTER — Other Ambulatory Visit: Payer: Self-pay

## 2020-03-24 ENCOUNTER — Other Ambulatory Visit: Payer: Medicare Other

## 2020-03-24 DIAGNOSIS — Z8546 Personal history of malignant neoplasm of prostate: Secondary | ICD-10-CM

## 2020-03-25 LAB — PSA: Prostate Specific Ag, Serum: <0.1 ng/mL (ref 0.0–4.0)

## 2020-03-25 LAB — TESTOSTERONE: Testosterone: 221 ng/dL — ABNORMAL LOW (ref 264–916)

## 2020-03-29 ENCOUNTER — Ambulatory Visit (INDEPENDENT_AMBULATORY_CARE_PROVIDER_SITE_OTHER): Payer: Medicare Other

## 2020-03-29 ENCOUNTER — Other Ambulatory Visit: Payer: Self-pay

## 2020-03-29 DIAGNOSIS — R350 Frequency of micturition: Secondary | ICD-10-CM

## 2020-03-29 NOTE — Progress Notes (Signed)
PTNS  Session # 11  Health & Social Factors: Diabetic  Caffeine: 1 cup qd Alcohol: none Daytime voids #per day: 6 Night-time voids #per night: 4 Urgency: yes Incontinence Episodes #per day: 4 Ankle used: left Treatment Setting: 5 Feeling/ Response: positive sensation in heel Comments: none  Performed By: Durenda Guthrie, lpn  Assistant: none  Follow Up: keep next scheduled [place]

## 2020-03-30 ENCOUNTER — Ambulatory Visit: Payer: Medicare Other

## 2020-03-31 ENCOUNTER — Ambulatory Visit: Payer: Medicare Other | Admitting: Urology

## 2020-03-31 DIAGNOSIS — R350 Frequency of micturition: Secondary | ICD-10-CM

## 2020-03-31 NOTE — Progress Notes (Deleted)
Subjective:  1. Urinary frequency     Jonathan [name] returns today in f/u from Urodynamics on 12/10/19 from nocturnal enuresis and daytime frequency with UUI.  He has been on oxybutynin and tamsulosin.   His UA today has WBC and RBC's.  His culture on 8/2 had Mx species.  Cipro has not helped but bactrim does help some despite the culture.      UDS Impression: Jonathan [name] held a max capacity of approx. 644 mls. His 1st sensation was felt at 246 mls. No SUI was noted during the study. There was positive low amplitude instability. He felt an increased urge but was able to inhibit his instability without leaking. He told me he leaks urine overnight when sleeping. He was able to generate a voluntary contraction and void. Increased EMG activity was noted during voiding. PVR was approx. 394 mls. Some trabeculation was noted. No reflux was seen.  He doesn't    He has a history of urinary retention following radiation therapy and required prolonged use of an SP tube followed by a TURP on 02/03/19.  He is having persistent daytime frequency but doesn't wake at night.  He has marked SUI and has to wear 2 pads at a time and has to change multiple times daily.  He has intermittency.  He has an adequate stream.   He remains on oxybutynin tid.  He has no hematuria but has some mild dysuria if he doesn't hydrate.   The urine is no longer malodorous.   His IPSS is 23/5.      He has a history of T3b N0 M0 Gleason 9 prostate cancer that was treated with EXRT and ADT.   He completed EXRT in 9/19.   His final  Lupron injection was given 1/21.  His PSA remains <0.1 on 08/21/19 and his testosterone remained low at 15 on 08/21/19.     He has been using some Sildenafil for ED with some benefit.  He would like to try a daily med.     ROS:  ROS:  A complete review of systems was performed.  All systems are negative except for pertinent findings as noted.   ROS  Allergies  Allergen Reactions  . Ampicillin   . Ciprofloxacin  Other (See Comments)    Doesn't work  . Levaquin [Levofloxacin] Other (See Comments)    Doesn't work    Outpatient Encounter Medications as of 03/31/2020  Medication Sig  . atenolol-chlorthalidone (TENORETIC) 50-25 MG tablet Take 1 tablet by mouth at bedtime.   . ciclopirox (PENLAC) 8 % solution Apply topically at bedtime. Apply over nail and surrounding skin. Apply daily over previous coat. After seven (7) days, may remove with alcohol and continue cycle.  . diclofenac Sodium (VOLTAREN) 1 % GEL Apply 2 g topically 4 (four) times daily as needed (pain).  [name] escitalopram (LEXAPRO) 5 MG tablet Take 5 mg by mouth daily.  [name] FREESTYLE LITE test strip   . gabapentin (NEURONTIN) 400 MG capsule Take 400 mg by mouth at bedtime.   . hydroquinone 4 % cream Apply topically 2 (two) times daily.  [name] ibuprofen (ADVIL) 600 MG tablet   . Incontinence Supply Disposable (FITTED BRIEFS MAXIMUM XL) MISC Use briefs as needed daily for incontinence  . Lancets (FREESTYLE) lancets   . LANTUS SOLOSTAR 100 UNIT/ML Solostar Pen Inject 2.5 Units into the skin daily.  [name] LINZESS 72 MCG capsule   . metFORMIN (GLUCOPHAGE) 500 MG tablet metformin 500 mg tablet  . metoCLOPramide (REGLAN)  5 MG tablet Take 5 mg by mouth in the morning, at noon, and at bedtime.  . mupirocin cream (BACTROBAN) 2 % Apply 1 application topically 2 (two) times daily as needed (wound care).  . mupirocin ointment (BACTROBAN) 2 % Apply topically.  . nystatin ointment (MYCOSTATIN)   . nystatin-triamcinolone ointment (MYCOLOG) Apply 1 application topically 2 (two) times daily. To groins  . oxybutynin (DITROPAN) 5 MG tablet Take 1 tablet (5 mg total) by mouth 3 (three) times daily.  . Oxycodone HCl 20 MG TABS Take 5 mg by mouth in the morning, at noon, in the evening, and at bedtime.   . pantoprazole (PROTONIX) 40 MG tablet Take 1 tablet (40 mg total) by mouth 2 (two) times daily before a meal.  . potassium chloride (KLOR-CON) 10 MEQ tablet Take 10 mEq by  mouth daily as needed.  . predniSONE (DELTASONE) 20 MG tablet Take 20 mg by mouth at bedtime.  . psyllium (METAMUCIL SMOOTH TEXTURE) 58.6 % powder Take 1 packet by mouth daily. (Patient taking differently: Take 1 packet by mouth every other day.)  . tadalafil (CIALIS) 5 MG tablet Take 1 tablet (5 mg total) by mouth daily as needed for erectile dysfunction.  . terbinafine (LAMISIL) 250 MG tablet Take 250 mg by mouth daily.  . timolol (TIMOPTIC) 0.5 % ophthalmic solution Place 1 drop into both eyes 2 (two) times daily as needed (eye pressure).   No facility-administered encounter medications on file as of 03/31/2020.    Past Medical History:  Diagnosis Date  . Acquired bladder diverticulum    12/ 2012  resection diverticulum done at [facility] in [place], [place]  . Age-related cataract of right eye    scheduled for catarat extraction 09-12-2017  . Agent orange exposure   . Aneurysm of infrarenal abdominal aorta ([place])    first dx 2017--- last CT 06-11-2017  measures 3.5cm  . Chronic constipation   . COPD with emphysema ([place])    06--12-2017  per pt no symptoms since Feb 2019  . Depression    PTSD  . Diabetes mellitus type 2, diet-controlled ([place])   . Diabetic neuropathy ([place])   . Dyslipidemia   . Dyspnea on exertion   . Erectile dysfunction   . Feeling of incomplete bladder emptying   . Gait abnormality 01/06/2020  . GERD (gastroesophageal reflux disease)   . Gout    09-02-2017 last flare up 3 months ago  . Hiatal hernia   . History of atrial fibrillation    episode post op right lung lobectomy 07-04-2015  . History of bladder stone   . History of colon polyps   . History of DVT of lower extremity    03/ 2019  bilateral lower extremity (superficial) ---  per treated w/ oral medication   . History of gastritis   . History of kidney stones   . History of radiation therapy 09-14-2015  to 09-21-2015   left upper lung nodule -- 54Gy in 3 fractions (18 Gy per fraction)  . Hyperplasia of  prostate with lower urinary tract symptoms (LUTS)   . Hypertension   . Lumbar spinal stenosis   . Nephrolithiasis   . Neurogenic bladder   . Osteoarthritis    ankle, hands  . Osteoporosis   . Prostate cancer [facility]) urologist-  dr Merline Perkin/  oncologist-  dr Tammi Klippel   dx 05-24-2017-- Stage T2b,  Gleason 4+4,  PSA 22.41,  vol 38cc--- started ADT 04/ 2019,  plan external radiation therapy  . Radiation  fibrosis of lung ([place])    hx left upper long nodule SBRT 06/ 2017  . Squamous cell carcinoma of both lungs ([place]) last CT in epic dated 06-26-2017 no recurrence   dx 03/ 2017  non-small cell SCC via bronchoscopy w/ bx's by dr Roxan Hockey---  s/p  right VATS w/ right lobectomy and node dissection's 07-04-2015 /  pt had SBRT to left upper nodule Stage 1 ([facility]) completed 09-21-2015  . Wears dentures    bottom  . Wears glasses     Past Surgical History:  Procedure Laterality Date  . BIOPSY  05/29/2019   Procedure: BIOPSY;  Surgeon: Rogene [place], MD;  [facility];  Service: Endoscopy;;  gastric ulcer  . CARPAL TUNNEL RELEASE Right 07/09/2014   Procedure: RIGHT OPEN CARPAL TUNNEL RELEASE;  Surgeon: Mcarthur Rossetti, MD;  [facility]: [facility];  Service: Orthopedics;  Laterality: Right;  . CHOLECYSTECTOMY N/A 02/24/2014   Procedure: LAPAROSCOPIC CHOLECYSTECTOMY;  Surgeon: Coralie Keens, MD;  Location: [place];  Service: General;  Laterality: N/A;  . COLONOSCOPY    . CYSTOLITHOTOMY  11/ 2007      [facility] [place]  . CYSTOSCOPY N/A 04/18/2018   Procedure: CYSTOSCOPY FLEXIBLE;  Surgeon: Irine Seal, MD;  Location: [facility];  Service: Urology;  Laterality: N/A;  . CYSTOSCOPY W/ LITHOLAPAXY / EHL  02-24-2007   dr Janice Norrie  [facility]  . dental implant     No teeth at this time waiting for them to be made as of 01-30-19  . ESOPHAGOGASTRODUODENOSCOPY (EGD) WITH PROPOFOL N/A 05/29/2019   Procedure: ESOPHAGOGASTRODUODENOSCOPY (EGD) WITH PROPOFOL;  Surgeon: Rogene [place], MD;   [facility];  Service: Endoscopy;  Laterality: N/A;  925  . GOLD SEED IMPLANT N/A 09/05/2017   Procedure: GOLD SEED IMPLANT;  Surgeon: Irine Seal, MD;  Location[facility];  Service: Urology;  Laterality: N/A;  . INSERTION OF SUPRAPUBIC CATHETER N/A 04/18/2018   Procedure: SUPRAPUBIC TUBE CHANGE;  Surgeon: Irine Seal, MD;  [facility];  Service: Urology;  Laterality: N/A;  . IR CATHETER TUBE CHANGE  02/24/2018  . SPACE OAR INSTILLATION N/A 09/05/2017   Procedure: SPACE OAR INSTILLATION;  Surgeon: Irine Seal, MD;  Location[facility];  Service: Urology;  Laterality: N/A;  . TOTAL ANKLE ARTHROPLASTY Right 04/20/2015   Procedure: TOTAL ANKLE ARTHOPLASTY;  Surgeon: Newt Minion, MD;  Location: [place];  Service: Orthopedics;  Laterality: Right;  . TRANSTHORACIC ECHOCARDIOGRAM  11/16/2012   ef 55-60%,  grade 1 diastolic dysfunction/  mild dilated ascending aorta/  mild MR/ trivial TR  . TRANSURETHRAL RESECTION OF PROSTATE N/A 02/03/2019   Procedure: TRANSURETHRAL RESECTION OF THE PROSTATE (TURP);  Surgeon: Irine Seal, MD;  [facility];  Service: Urology;  Laterality: N/A;  . VIDEO ASSISTED THORACOSCOPY (VATS)/ LOBECTOMY Right 07/04/2015   Procedure: VIDEO ASSISTED THORACOSCOPY (VATS)/ RIGHT UPPER LOBECTOMY;  Surgeon: Melrose Nakayama, MD;  Location: [place];  Service: Thoracic;  Laterality: Right;  [name] VIDEO BRONCHOSCOPY N/A 06/17/2015   Procedure: VIDEO BRONCHOSCOPY;  Surgeon: Melrose Nakayama, MD;  Location: [place];  Service: Thoracic;  Laterality: N/A;  . VIDEO BRONCHOSCOPY WITH ENDOBRONCHIAL NAVIGATION N/A 06/17/2015   Procedure: VIDEO BRONCHOSCOPY WITH ENDOBRONCHIAL NAVIGATION;  Surgeon: Melrose Nakayama, MD;  Location: [place];  Service: Thoracic;  Laterality: N/A;  . VIDEO BRONCHOSCOPY WITH ENDOBRONCHIAL ULTRASOUND N/A 06/17/2015   Procedure: VIDEO BRONCHOSCOPY WITH ENDOBRONCHIAL ULTRASOUND;  Surgeon: Melrose Nakayama, MD;  [facility];  Service: Thoracic;  Laterality: N/A;    Social History   Socioeconomic History  . Marital status: Married    Spouse name: Jerl Santos  . Number of children: 2  . Years of education: college  . Highest education level: Not on file  Occupational History  . Occupation: retired  Tobacco Use  . Smoking status: Former Smoker    Packs/day: 0.50    Years: 40.00    Pack years: 20.00    Types: Cigarettes    Quit date: 02/13/2015    Years since quitting: 5.1  . Smokeless tobacco: Never Used  Vaping Use  . Vaping Use: Never used  Substance and Sexual Activity  . Alcohol use: No    Alcohol/week: 0.0 standard drinks  . Drug use: No  . Sexual activity: Not Currently  Other Topics Concern  . Not on file  Social History Narrative   Lives with wife, daughter and son in law temporarily to help them out   Right Handed   Drinks caffeine occassionally   Social Determinants of Health   Financial Resource Strain: Not on file  Food Insecurity: Not on file  Transportation Needs: Not on file  Physical Activity: Not on file  Stress: Not on file  Social Connections: Not on file  Intimate Partner Violence: Not on file    Family History  Problem Relation Age of Onset  . Cancer Father        Asbestos  . Colon cancer Neg Hx   . Colon polyps Neg Hx   . Kidney disease Neg Hx   . Esophageal cancer Neg Hx   . Gallbladder disease Neg Hx   . Heart disease Neg Hx   . Diabetes Neg Hx        Objective: There were no vitals filed for this visit.   Physical Exam  Lab Results:  No results found for this or any previous visit (from the past 24 hour(s)).  BMET No results for input(s): NA, K, CL, CO2, GLUCOSE, BUN, CREATININE, CALCIUM in the last 72 hours. PSA PSA  Date Value Ref Range Status  08/21/2019 <0.1 < OR = 4.0 ng/mL Final    Comment:    The total PSA value from this assay system is  standardized against the WHO standard. The test  result will be approximately 20%  lower when compared  to the equimolar-standardized total PSA (Beckman  Coulter). Comparison of serial PSA results should be  interpreted with this fact in mind. . This test was performed using the Siemens  chemiluminescent method. Values obtained from  different assay methods cannot be used interchangeably. PSA levels, regardless of value, should not be interpreted as absolute evidence of the presence or absence of disease.   04/06/2019 <0.1 < OR = 4.0 ng/mL Final    Comment:    The total PSA value from this assay system is  standardized against the WHO standard. The test  result will be approximately 20% lower when compared  to the equimolar-standardized total PSA (Beckman  Coulter). Comparison of serial PSA results should be  interpreted with this fact in mind. . This test was performed using the Siemens  chemiluminescent method. Values obtained from  different assay methods cannot be used interchangeably. PSA levels, regardless of value, should not be interpreted as absolute evidence of the presence or absence of disease.   10/26/2008 0.69 0.10 - 4.00 ng/mL Final    Comment:    See lab report for  associated comment(s)   Testosterone  Date Value Ref Range Status  03/24/2020 221 (L) 264 - 916 ng/dL Final    Comment:    Adult male reference interval is based on a population of healthy nonobese males (BMI <30) between 85 and 58 years old. Ceres, Leonardo [id number]. PMID: [id number].   08/21/2019 15 (L) 250 - 827 ng/dL Final    Comment:    In hypogonadal males, Testosterone, Total, LC/MS/MS, is the recommended assay due to the diminished accuracy of immunoassay at levels below 250 ng/dL. This test code [phone]) must be collected in a red-top tube with no gel.    04/06/2019 14 (L) 250 - 827 ng/dL Final    Comment:    In hypogonadal males, Testosterone, Total, LC/MS/MS, is the recommended assay due to the diminished accuracy of immunoassay at levels below  250 ng/dL. This test code [phone]) must be collected in a red-top tube with no gel.     Lab Results  Component Value Date   PSA1 <0.1 03/24/2020   Lab Results  Component Value Date   TESTOSTERONE 221 (L) 03/24/2020   TESTOSTERONE 15 (L) 08/21/2019   TESTOSTERONE 14 (L) 04/06/2019      Studies/Results: PSA and T reviewed.     Assessment & Plan: Prostate cancer.   He got his last Lupron in 1/21.  PSA remains suppressed with <0.1 with a testosterone that is up to 221.   He will have a PSA and testosterone in 3 months.   OAB with incontinence with an elevated PVR.  I am going to have him stop the Oxybutynin and will begin PTNS.  Recurrent UTI's.  I will get a culture today and if it is positive I will treat and put him on suppressive meds.    ED.  I will give him a script for tadalafil for daily use.   No orders of the defined types were placed in this encounter.    No orders of the defined types were placed in this encounter.     No follow-ups on file.   CC: [name], PA-C      Irine Seal 03/31/2020

## 2020-04-01 ENCOUNTER — Ambulatory Visit: Payer: Medicare Other | Admitting: Urology

## 2020-04-05 ENCOUNTER — Other Ambulatory Visit: Payer: Self-pay

## 2020-04-05 ENCOUNTER — Ambulatory Visit (INDEPENDENT_AMBULATORY_CARE_PROVIDER_SITE_OTHER): Payer: Medicare Other

## 2020-04-05 DIAGNOSIS — R350 Frequency of micturition: Secondary | ICD-10-CM | POA: Diagnosis not present

## 2020-04-05 NOTE — Progress Notes (Signed)
PTNS  Session # 12  Health & Social Factors: diabetic, take fluid pills Caffeine: none Alcohol: none Daytime voids #per day: 6-8 Night-time voids #per night: 4-5 Urgency: yes Incontinence Episodes #per day: 4 Ankle used: left Treatment Setting: 3 Feeling/ Response: positive sensation Comments: none  Performed By: Durenda Guthrie, lpn  Assistant: none  Follow Up: Keep next OV

## 2020-04-15 DIAGNOSIS — K5901 Slow transit constipation: Secondary | ICD-10-CM | POA: Diagnosis not present

## 2020-04-15 DIAGNOSIS — E1143 Type 2 diabetes mellitus with diabetic autonomic (poly)neuropathy: Secondary | ICD-10-CM | POA: Diagnosis not present

## 2020-04-15 DIAGNOSIS — C61 Malignant neoplasm of prostate: Secondary | ICD-10-CM | POA: Diagnosis not present

## 2020-04-15 DIAGNOSIS — R32 Unspecified urinary incontinence: Secondary | ICD-10-CM | POA: Diagnosis not present

## 2020-04-15 DIAGNOSIS — R42 Dizziness and giddiness: Secondary | ICD-10-CM | POA: Diagnosis not present

## 2020-04-15 DIAGNOSIS — I10 Essential (primary) hypertension: Secondary | ICD-10-CM | POA: Diagnosis not present

## 2020-04-15 DIAGNOSIS — J701 Chronic and other pulmonary manifestations due to radiation: Secondary | ICD-10-CM | POA: Diagnosis not present

## 2020-04-15 DIAGNOSIS — R350 Frequency of micturition: Secondary | ICD-10-CM | POA: Diagnosis not present

## 2020-04-27 DIAGNOSIS — M48061 Spinal stenosis, lumbar region without neurogenic claudication: Secondary | ICD-10-CM | POA: Diagnosis not present

## 2020-04-27 DIAGNOSIS — Z6830 Body mass index (BMI) 30.0-30.9, adult: Secondary | ICD-10-CM | POA: Diagnosis not present

## 2020-05-04 NOTE — Progress Notes (Signed)
Subjective:  1. Urinary frequency   2. Urgency of urination   3. Urge incontinence   4. Recurrent UTI   5. Erectile dysfunction due to arterial insufficiency   6. History of prostate cancer     Jonathan [name] returns today in f/u from a 12 week course of PTNS.   He has done well with the treatments and his nocturia is markedly improved to 0-3 depending on fluid intake.  His urgency has improved.  His IPSS is 15/4  from Urodynamics on 12/10/19 from nocturnal enuresis and daytime frequency with UUI.  He is off of oxybutynin and tamsulosin.  He was given tadalafil but never got.   His UA today has WBC and RBC's with minimal bacteria. He has some dysuria.  He continues to use pads for incontinence but that has improved with PTNS.     He has a history of T3b N0 M0 Gleason 9 prostate cancer that was treated with EXRT and ADT.   He completed EXRT in 9/19.   His final  Lupron injection was given 1/21.  His PSA remains <0.1 on 03/24/20 but his testosterone has gone up to 221 on 03/24/20 from 15 on 08/21/19.      He has been using some Sildenafil for ED with minimal benefit.   He would like the brand.   ROS:  ROS:  A complete review of systems was performed.  All systems are negative except for pertinent findings as noted.   ROS  Allergies  Allergen Reactions  . Penicillins Hives  . Ampicillin   . Ciprofloxacin Other (See Comments)    Doesn't work  . Levaquin [Levofloxacin] Other (See Comments)    Doesn't work    Outpatient Encounter Medications as of 05/05/2020  Medication Sig  . atenolol-chlorthalidone (TENORETIC) 50-25 MG tablet Take 1 tablet by mouth at bedtime.   . ciclopirox (PENLAC) 8 % solution Apply topically at bedtime. Apply over nail and surrounding skin. Apply daily over previous coat. After seven (7) days, may remove with alcohol and continue cycle.  . diclofenac Sodium (VOLTAREN) 1 % GEL Apply 2 g topically 4 (four) times daily as needed (pain).  [name] escitalopram (LEXAPRO) 5 MG  tablet Take 5 mg by mouth daily.  [name] FREESTYLE LITE test strip   . gabapentin (NEURONTIN) 400 MG capsule Take 400 mg by mouth at bedtime.   . hydroquinone 4 % cream Apply topically 2 (two) times daily.  [name] ibuprofen (ADVIL) 600 MG tablet   . Incontinence Supply Disposable (FITTED BRIEFS MAXIMUM XL) MISC Use briefs as needed daily for incontinence  . Lancets (FREESTYLE) lancets   . LANTUS SOLOSTAR 100 UNIT/ML Solostar Pen Inject 2.5 Units into the skin daily.  [name] LINZESS 72 MCG capsule   . metoCLOPramide (REGLAN) 5 MG tablet Take 5 mg by mouth in the morning, at noon, and at bedtime.  . mupirocin cream (BACTROBAN) 2 % Apply 1 application topically 2 (two) times daily as needed (wound care).  . mupirocin ointment (BACTROBAN) 2 % Apply topically.  . nystatin ointment (MYCOSTATIN)   . nystatin-triamcinolone ointment (MYCOLOG) Apply 1 application topically 2 (two) times daily. To groins  . oxybutynin (DITROPAN) 5 MG tablet Take 1 tablet (5 mg total) by mouth 3 (three) times daily.  . Oxycodone HCl 20 MG TABS Take 5 mg by mouth in the morning, at noon, in the evening, and at bedtime.   . potassium chloride (KLOR-CON) 10 MEQ tablet Take 10 mEq by mouth daily as needed.  [name]  predniSONE (DELTASONE) 20 MG tablet Take 20 mg by mouth at bedtime.  . psyllium (METAMUCIL SMOOTH TEXTURE) 58.6 % powder Take 1 packet by mouth daily. (Patient taking differently: Take 1 packet by mouth every other day.)  . tadalafil (CIALIS) 5 MG tablet Take 1 tablet (5 mg total) by mouth daily as needed for erectile dysfunction.  . terbinafine (LAMISIL) 250 MG tablet Take 250 mg by mouth daily.  . timolol (TIMOPTIC) 0.5 % ophthalmic solution Place 1 drop into both eyes 2 (two) times daily as needed (eye pressure).  . capsaicin (ZOSTRIX) 0.025 % cream Apply 1 application topically 2 (two) times daily.  [name] gabapentin (NEURONTIN) 800 MG tablet   . pantoprazole (PROTONIX) 40 MG tablet Take 1 tablet (40 mg total) by mouth 2 (two) times  daily before a meal. (Patient not taking: Reported on 05/05/2020)  . sildenafil (VIAGRA) 100 MG tablet Take by mouth.  Stann Ore HANDIHALER 18 MCG inhalation capsule SMARTSIG:Capsule(s) Via Inhaler   No facility-administered encounter medications on file as of 05/05/2020.    Past Medical History:  Diagnosis Date  . Acquired bladder diverticulum    12/ 2012  resection diverticulum done at [facility] in [place], [place]  . Age-related cataract of right eye    scheduled for catarat extraction 09-12-2017  . Agent orange exposure   . Aneurysm of infrarenal abdominal aorta ([place])    first dx 2017--- last CT 06-11-2017  measures 3.5cm  . Chronic constipation   . COPD with emphysema ([place])    06--12-2017  per pt no symptoms since Feb 2019  . Depression    PTSD  . Diabetes mellitus type 2, diet-controlled ([place])   . Diabetic neuropathy ([place])   . Dyslipidemia   . Dyspnea on exertion   . Erectile dysfunction   . Feeling of incomplete bladder emptying   . Gait abnormality 01/06/2020  . GERD (gastroesophageal reflux disease)   . Gout    09-02-2017 last flare up 3 months ago  . Hiatal hernia   . History of atrial fibrillation    episode post op right lung lobectomy 07-04-2015  . History of bladder stone   . History of colon polyps   . History of DVT of lower extremity    03/ 2019  bilateral lower extremity (superficial) ---  per treated w/ oral medication   . History of gastritis   . History of kidney stones   . History of radiation therapy 09-14-2015  to 09-21-2015   left upper lung nodule -- 54Gy in 3 fractions (18 Gy per fraction)  . Hyperplasia of prostate with lower urinary tract symptoms (LUTS)   . Hypertension   . Lumbar spinal stenosis   . Nephrolithiasis   . Neurogenic bladder   . Osteoarthritis    ankle, hands  . Osteoporosis   . Prostate cancer [facility]) urologist-  dr Sheriece Jefcoat/  oncologist-  dr Tammi Klippel   dx 05-24-2017-- Stage T2b,  Gleason 4+4,  PSA 22.41,  vol 38cc--- started ADT 04/  2019,  plan external radiation therapy  . Radiation fibrosis of lung ([place])    hx left upper long nodule SBRT 06/ 2017  . Squamous cell carcinoma of both lungs ([place]) last CT in epic dated 06-26-2017 no recurrence   dx 03/ 2017  non-small cell SCC via bronchoscopy w/ bx's by dr Roxan Hockey---  s/p  right VATS w/ right lobectomy and node dissection's 07-04-2015 /  pt had SBRT to left upper nodule Stage 1 ([facility]) completed 09-21-2015  .  Wears dentures    bottom  . Wears glasses     Past Surgical History:  Procedure Laterality Date  . BIOPSY  05/29/2019   Procedure: BIOPSY;  Surgeon: Rogene Houston, MD;  [facility];  Service: Endoscopy;;  gastric ulcer  . CARPAL TUNNEL RELEASE Right 07/09/2014   Procedure: RIGHT OPEN CARPAL TUNNEL RELEASE;  Surgeon: Mcarthur Rossetti, MD;  [facility];  Service: Orthopedics;  Laterality: Right;  . CHOLECYSTECTOMY N/A 02/24/2014   Procedure: LAPAROSCOPIC CHOLECYSTECTOMY;  Surgeon: Coralie Keens, MD;  Location: [facility];  Service: General;  Laterality: N/A;  . COLONOSCOPY    . CYSTOLITHOTOMY  11/ [facility]      [facility] [place]  . CYSTOSCOPY N/A 04/18/2018   Procedure: CYSTOSCOPY FLEXIBLE;  Surgeon: Irine Seal, MD;  [facility]: [facility];  Service: Urology;  Laterality: N/A;  . CYSTOSCOPY W/ LITHOLAPAXY / EHL  02-24-2007   dr Janice Norrie  [facility]  . dental implant     No teeth at this time waiting for them to be made as of 01-30-19  . ESOPHAGOGASTRODUODENOSCOPY (EGD) WITH PROPOFOL N/A 05/29/2019   Procedure: ESOPHAGOGASTRODUODENOSCOPY (EGD) WITH PROPOFOL;  Surgeon: Rogene Houston, MD;  [facility];  Service: Endoscopy;  Laterality: N/A;  925  . GOLD SEED IMPLANT N/A 09/05/2017   Procedure: GOLD SEED IMPLANT;  Surgeon: Irine Seal, MD;  Location: [facility];  Service: Urology;  Laterality: N/A;  . INSERTION OF SUPRAPUBIC CATHETER N/A 04/18/2018   Procedure: SUPRAPUBIC TUBE CHANGE;  Surgeon: Irine Seal,  MD;  [facility];  Service: Urology;  Laterality: N/A;  . IR CATHETER TUBE CHANGE  02/24/2018  . SPACE OAR INSTILLATION N/A 09/05/2017   Procedure: SPACE OAR INSTILLATION;  Surgeon: Irine Seal, MD;  Location: [facility];  Service: Urology;  Laterality: N/A;  . TOTAL ANKLE ARTHROPLASTY Right 04/20/2015   Procedure: TOTAL ANKLE ARTHOPLASTY;  Surgeon: Newt Minion, MD;  Location: [facility];  Service: Orthopedics;  Laterality: Right;  . TRANSTHORACIC ECHOCARDIOGRAM  11/16/2012   ef 55-60%,  grade 1 diastolic dysfunction/  mild dilated ascending aorta/  mild MR/ trivial TR  . TRANSURETHRAL RESECTION OF PROSTATE N/A 02/03/2019   Procedure: TRANSURETHRAL RESECTION OF THE PROSTATE (TURP);  Surgeon: Irine Seal, MD;  [facility];  Service: Urology;  Laterality: N/A;  . VIDEO ASSISTED THORACOSCOPY (VATS)/ LOBECTOMY Right 07/04/2015   Procedure: VIDEO ASSISTED THORACOSCOPY (VATS)/ RIGHT UPPER LOBECTOMY;  Surgeon: Melrose Nakayama, MD;  Location: [facility];  Service: Thoracic;  Laterality: Right;  [name] VIDEO BRONCHOSCOPY N/A 06/17/2015   Procedure: VIDEO BRONCHOSCOPY;  Surgeon: Melrose Nakayama, MD;  Location: [facility];  Service: Thoracic;  Laterality: N/A;  . VIDEO BRONCHOSCOPY WITH ENDOBRONCHIAL NAVIGATION N/A 06/17/2015   Procedure: VIDEO BRONCHOSCOPY WITH ENDOBRONCHIAL NAVIGATION;  Surgeon: Melrose Nakayama, MD;  Location: [place];  Service: Thoracic;  Laterality: N/A;  . VIDEO BRONCHOSCOPY WITH ENDOBRONCHIAL ULTRASOUND N/A 06/17/2015   Procedure: VIDEO BRONCHOSCOPY WITH ENDOBRONCHIAL ULTRASOUND;  Surgeon: Melrose Nakayama, MD;  Location: [place];  Service: Thoracic;  Laterality: N/A;    Social History   Socioeconomic History  . Marital status: Married    Spouse name: Jerl Santos  . Number of children: 2  . Years of education: college  . Highest education level: Not on file  Occupational History  . Occupation: retired  Tobacco Use  . Smoking status: Former Smoker     Packs/day: 0.50    Years: 40.00  Pack years: 20.00    Types: Cigarettes    Quit date: 02/13/2015    Years since quitting: 5.2  . Smokeless tobacco: Never Used  Vaping Use  . Vaping Use: Never used  Substance and Sexual Activity  . Alcohol use: No    Alcohol/week: 0.0 standard drinks  . Drug use: No  . Sexual activity: Not Currently  Other Topics Concern  . Not on file  Social History Narrative   Lives with wife, daughter and son in law temporarily to help them out   Right Handed   Drinks caffeine occassionally   Social Determinants of Health   Financial Resource Strain: Not on file  Food Insecurity: Not on file  Transportation Needs: Not on file  Physical Activity: Not on file  Stress: Not on file  Social Connections: Not on file  Intimate Partner Violence: Not on file    Family History  Problem Relation Age of Onset  . Cancer Father        Asbestos  . Colon cancer Neg Hx   . Colon polyps Neg Hx   . Kidney disease Neg Hx   . Esophageal cancer Neg Hx   . Gallbladder disease Neg Hx   . Heart disease Neg Hx   . Diabetes Neg Hx        Objective: Vitals:   05/05/20 0944  BP: (!) 145/81  Pulse: 85  Temp: 98.6 F (37 C)     Physical Exam  Lab Results:  Results for orders placed or performed in visit on 05/05/20 (from the past 24 hour(s))  Urinalysis, Routine w reflex microscopic     Status: Abnormal   Collection Time: 05/05/20  9:34 AM  Result Value Ref Range   Specific Gravity, UA >1.030 (H) 1.005 - 1.030   pH, UA 6.0 5.0 - 7.5   Color, UA Amber (A) Yellow   Appearance Ur Clear Clear   Leukocytes,UA Trace (A) Negative   Protein,UA 2+ (A) Negative/Trace   Glucose, UA Negative Negative   Ketones, UA Trace (A) Negative   RBC, UA Trace (A) Negative   Bilirubin, UA Negative Negative   Urobilinogen, Ur 0.2 0.2 - 1.0 mg/dL   Nitrite, UA Negative Negative   Microscopic Examination See below:    Narrative   Performed at:  [facility] [address], [place], [place]  [id number] Lab Director: Mina Marble MT, Phone:  [phone]  Microscopic Examination     Status: Abnormal   Collection Time: 05/05/20  9:34 AM   Urine  Result Value Ref Range   WBC, UA 11-30 (A) 0 - 5 /hpf   RBC 3-10 (A) 0 - 2 /hpf   Epithelial Cells (non renal) 0-10 0 - 10 /hpf   Renal Epithel, UA None seen None seen /hpf   Mucus, UA Present Not Estab.   Bacteria, UA Few (A) None seen/Few   Narrative   Performed at:  [facility] [address] [place], [place]  [id number] Lab Director: [place], Phone:  [phone]    BMET No results for input(s): NA, K, CL, CO2, GLUCOSE, BUN, CREATININE, CALCIUM in the last 72 hours. PSA PSA  Date Value Ref Range Status  08/21/2019 <0.1 < OR = 4.0 ng/mL Final    Comment:    The total PSA value from this assay system is  standardized against the WHO standard. The test  result will be approximately 20% lower when compared  to the equimolar-standardized  total PSA (Beckman  Coulter). Comparison of serial PSA results should be  interpreted with this fact in mind. . This test was performed using the Siemens  chemiluminescent method. Values obtained from  different assay methods cannot be used interchangeably. PSA levels, regardless of value, should not be interpreted as absolute evidence of the presence or absence of disease.   04/06/2019 <0.1 < OR = 4.0 ng/mL Final    Comment:    The total PSA value from this assay system is  standardized against the WHO standard. The test  result will be approximately 20% lower when compared  to the equimolar-standardized total PSA (Beckman  Coulter). Comparison of serial PSA results should be  interpreted with this fact in mind. . This test was performed using the Siemens  chemiluminescent method. Values obtained from  different assay methods cannot be used interchangeably. PSA levels, regardless of value, should not be interpreted as  absolute evidence of the presence or absence of disease.   10/26/2008 0.69 0.10 - 4.00 ng/mL Final    Comment:    See lab report for associated comment(s)   Testosterone  Date Value Ref Range Status  03/24/2020 221 (L) 264 - 916 ng/dL Final    Comment:    Adult male reference interval is based on a population of healthy nonobese males (BMI <30) between 2 and 52 years old. [place], [place] [phone]. PMID: [id number].   08/21/2019 15 (L) 250 - 827 ng/dL Final    Comment:    In hypogonadal males, Testosterone, Total, LC/MS/MS, is the recommended assay due to the diminished accuracy of immunoassay at levels below 250 ng/dL. This test code [id number]) must be collected in a red-top tube with no gel.    04/06/2019 14 (L) 250 - 827 ng/dL Final    Comment:    In hypogonadal males, Testosterone, Total, LC/MS/MS, is the recommended assay due to the diminished accuracy of immunoassay at levels below 250 ng/dL. This test code [id number]) must be collected in a red-top tube with no gel.        Studies/Results: PSA and T reviewed.     Assessment & Plan: Prostate cancer.   His PSA remains undetectible with testosterone recovery.   OAB with incontinence with an elevated PVR.  He is doing better with the PTNS.   Recurrent UTI's.  I will get a culture today and if it is positive I will treat.    ED.  I will give him a script for tadalafil for daily use for the ED and voiding symptoms. .   No orders of the defined types were placed in this encounter.    Orders Placed This Encounter  Procedures  . Microscopic Examination  . Urinalysis, Routine w reflex microscopic      No follow-ups on file.   CC: [name], PA-C      Irine Seal 05/05/2020

## 2020-05-05 ENCOUNTER — Encounter: Payer: Self-pay | Admitting: Urology

## 2020-05-05 ENCOUNTER — Ambulatory Visit (INDEPENDENT_AMBULATORY_CARE_PROVIDER_SITE_OTHER): Payer: Medicare Other | Admitting: Urology

## 2020-05-05 ENCOUNTER — Other Ambulatory Visit: Payer: Self-pay

## 2020-05-05 VITALS — BP 145/81 | HR 85 | Temp 98.6°F | Ht >= 80 in | Wt 251.0 lb

## 2020-05-05 DIAGNOSIS — N390 Urinary tract infection, site not specified: Secondary | ICD-10-CM

## 2020-05-05 DIAGNOSIS — R3915 Urgency of urination: Secondary | ICD-10-CM

## 2020-05-05 DIAGNOSIS — Z8546 Personal history of malignant neoplasm of prostate: Secondary | ICD-10-CM

## 2020-05-05 DIAGNOSIS — N5201 Erectile dysfunction due to arterial insufficiency: Secondary | ICD-10-CM | POA: Diagnosis not present

## 2020-05-05 DIAGNOSIS — R350 Frequency of micturition: Secondary | ICD-10-CM

## 2020-05-05 DIAGNOSIS — N3941 Urge incontinence: Secondary | ICD-10-CM

## 2020-05-05 LAB — URINALYSIS, ROUTINE W REFLEX MICROSCOPIC
Bilirubin, UA: NEGATIVE
Glucose, UA: NEGATIVE
Nitrite, UA: NEGATIVE
Specific Gravity, UA: >1.03 — ABNORMAL HIGH (ref 1.005–1.030)
Urobilinogen, Ur: 0.2 mg/dL (ref 0.2–1.0)
pH, UA: 6 (ref 5.0–7.5)

## 2020-05-05 LAB — MICROSCOPIC EXAMINATION: Renal Epithel, UA: NONE SEEN /HPF

## 2020-05-05 MED ORDER — TADALAFIL 5 MG PO TABS: 5.0000 mg | ORAL_TABLET | Freq: Every day | ORAL | 3 refills | Status: DC | PRN

## 2020-05-05 MED ORDER — VIAGRA 100 MG PO TABS: 100.0000 mg | ORAL_TABLET | Freq: Every day | ORAL | 3 refills | Status: DC | PRN

## 2020-05-05 NOTE — Progress Notes (Signed)
Urological Symptom Review  Patient is experiencing the following symptoms: Frequency Hard to postponed  Stream starts and stops Have to strain Erection problems Review of Systems  Gastrointestinal (upper)  : Negative for upper GI symptoms  Gastrointestinal (lower) : Constipation  Constitutional : Fatigue  Skin: Negative for skin symptoms  Eyes: Blurred vision  Ear/Nose/Throat : Negative for Ear/Nose/Throat symptoms  Hematologic/Lymphatic: Negative for Hematologic/Lymphatic symptoms  Cardiovascular : Leg swelling  Respiratory : Negative for respiratory symptoms  Endocrine: Negative for endocrine symptoms  Musculoskeletal: Joint pain  Neurological: Negative for neurological symptoms  Psychologic: Negative for psychiatric symptoms

## 2020-05-07 LAB — URINE CULTURE

## 2020-05-09 ENCOUNTER — Telehealth: Payer: Self-pay

## 2020-05-09 ENCOUNTER — Other Ambulatory Visit: Payer: Self-pay

## 2020-05-09 ENCOUNTER — Ambulatory Visit: Payer: Medicare Other

## 2020-05-09 DIAGNOSIS — N5201 Erectile dysfunction due to arterial insufficiency: Secondary | ICD-10-CM

## 2020-05-09 MED ORDER — VIAGRA 100 MG PO TABS: 100.0000 mg | ORAL_TABLET | Freq: Every day | ORAL | 3 refills | Status: DC | PRN

## 2020-05-09 NOTE — Telephone Encounter (Signed)
-----   Message from Irine Seal, MD sent at 05/09/2020  1:30 PM EST ----- Negative.

## 2020-05-09 NOTE — Telephone Encounter (Signed)
Spoke with pt. Notified him his urine culture was negative. He then wanted to know where his rx for Viagra was sent. Told pt Dr. Jeffie Pollock sent to [facility]. Pt wanted it sent to [facility]. Rx resent there.

## 2020-05-12 DIAGNOSIS — M79674 Pain in right toe(s): Secondary | ICD-10-CM | POA: Diagnosis not present

## 2020-05-12 DIAGNOSIS — M79672 Pain in left foot: Secondary | ICD-10-CM | POA: Diagnosis not present

## 2020-05-12 DIAGNOSIS — I739 Peripheral vascular disease, unspecified: Secondary | ICD-10-CM | POA: Diagnosis not present

## 2020-05-12 DIAGNOSIS — E1140 Type 2 diabetes mellitus with diabetic neuropathy, unspecified: Secondary | ICD-10-CM | POA: Diagnosis not present

## 2020-05-12 DIAGNOSIS — L110 Acquired keratosis follicularis: Secondary | ICD-10-CM | POA: Diagnosis not present

## 2020-05-12 DIAGNOSIS — M79675 Pain in left toe(s): Secondary | ICD-10-CM | POA: Diagnosis not present

## 2020-05-12 DIAGNOSIS — M79671 Pain in right foot: Secondary | ICD-10-CM | POA: Diagnosis not present

## 2020-05-19 ENCOUNTER — Other Ambulatory Visit: Payer: Self-pay

## 2020-05-19 DIAGNOSIS — N3941 Urge incontinence: Secondary | ICD-10-CM

## 2020-05-19 NOTE — Addendum Note (Signed)
Addended byIris Pert on: 05/19/2020 04:41 PM   Modules accepted: Orders

## 2020-05-19 NOTE — Progress Notes (Signed)
Patient called requesting rx be sent into [facility] for bed pads due to urinary incontinence.

## 2020-05-20 NOTE — Telephone Encounter (Signed)
See prior note

## 2020-05-24 DIAGNOSIS — M545 Low back pain, unspecified: Secondary | ICD-10-CM | POA: Diagnosis not present

## 2020-05-24 DIAGNOSIS — Z6828 Body mass index (BMI) 28.0-28.9, adult: Secondary | ICD-10-CM | POA: Diagnosis not present

## 2020-05-24 DIAGNOSIS — M19071 Primary osteoarthritis, right ankle and foot: Secondary | ICD-10-CM | POA: Diagnosis not present

## 2020-05-24 DIAGNOSIS — I700 Atherosclerosis of aorta: Secondary | ICD-10-CM | POA: Diagnosis not present

## 2020-05-24 DIAGNOSIS — M6281 Muscle weakness (generalized): Secondary | ICD-10-CM | POA: Diagnosis not present

## 2020-05-26 ENCOUNTER — Encounter: Payer: Self-pay | Admitting: Neurology

## 2020-05-26 ENCOUNTER — Ambulatory Visit (INDEPENDENT_AMBULATORY_CARE_PROVIDER_SITE_OTHER): Payer: Medicare Other | Admitting: Neurology

## 2020-05-26 VITALS — BP 126/68 | HR 83 | Ht >= 80 in | Wt 262.0 lb

## 2020-05-26 DIAGNOSIS — R269 Unspecified abnormalities of gait and mobility: Secondary | ICD-10-CM

## 2020-05-26 DIAGNOSIS — E1142 Type 2 diabetes mellitus with diabetic polyneuropathy: Secondary | ICD-10-CM

## 2020-05-26 MED ORDER — GABAPENTIN 300 MG PO CAPS: 300.0000 mg | ORAL_CAPSULE | Freq: Two times a day (BID) | ORAL | 11 refills | Status: DC

## 2020-05-26 NOTE — Progress Notes (Signed)
Reason for visit: Peripheral neuropathy, gait disturbance  Jonathan [name] is an 80 y.o. male  History of present illness:  Jonathan [name] is a 80 year old right-handed black male with a history of diabetes and severe diabetic peripheral neuropathy.  The patient also has a fairly severe right ulnar neuropathy and weakness in the right hand.  He has a history of prostate cancer and lung cancer.  The patient walks with a cane currently, he has ongoing gait instability, he indicates that he will be getting a walker in the near future.  He is trying to get a recumbent exercise trainer through the [facility] hospital to help strengthen his legs.  The patient is on gabapentin for some discomfort in the feet at night.  He takes 300 mg twice daily.  He gets his medications through the [facility] hospital.  He returns this office for further evaluation.  Past Medical History:  Diagnosis Date  . Acquired bladder diverticulum    12/ 2012  resection diverticulum done at [facility] in [place], [place]  . Age-related cataract of right eye    scheduled for catarat extraction 09-12-2017  . Agent orange exposure   . Aneurysm of infrarenal abdominal aorta ([place])    first dx 2017--- last CT 06-11-2017  measures 3.5cm  . Chronic constipation   . COPD with emphysema ([place])    06--12-2017  per pt no symptoms since Feb 2019  . Depression    PTSD  . Diabetes mellitus type 2, diet-controlled ([place])   . Diabetic neuropathy ([place])   . Dyslipidemia   . Dyspnea on exertion   . Erectile dysfunction   . Feeling of incomplete bladder emptying   . Gait abnormality 01/06/2020  . GERD (gastroesophageal reflux disease)   . Gout    09-02-2017 last flare up 3 months ago  . Hiatal hernia   . History of atrial fibrillation    episode post op right lung lobectomy 07-04-2015  . History of bladder stone   . History of colon polyps   . History of DVT of lower extremity    03/ 2019  bilateral lower extremity (superficial) ---  per treated w/ oral  medication   . History of gastritis   . History of kidney stones   . History of radiation therapy 09-14-2015  to 09-21-2015   left upper lung nodule -- 54Gy in 3 fractions (18 Gy per fraction)  . Hyperplasia of prostate with lower urinary tract symptoms (LUTS)   . Hypertension   . Lumbar spinal stenosis   . Nephrolithiasis   . Neurogenic bladder   . Osteoarthritis    ankle, hands  . Osteoporosis   . Prostate cancer [facility]) urologist-  dr wrenn/  oncologist-  dr Tammi Klippel   dx 05-24-2017-- Stage T2b,  Gleason 4+4,  PSA 22.41,  vol 38cc--- started ADT 04/ 2019,  plan external radiation therapy  . Radiation fibrosis of lung ([place])    hx left upper long nodule SBRT 06/ 2017  . Squamous cell carcinoma of both lungs ([place]) last CT in epic dated 06-26-2017 no recurrence   dx 03/ 2017  non-small cell SCC via bronchoscopy w/ bx's by dr Roxan Hockey---  s/p  right VATS w/ right lobectomy and node dissection's 07-04-2015 /  pt had SBRT to left upper nodule Stage 1 ([facility]) completed 09-21-2015  . Wears dentures    bottom  . Wears glasses     Past Surgical History:  Procedure Laterality Date  . BIOPSY  05/29/2019   Procedure: BIOPSY;  Surgeon: Rogene Houston, MD;  [facility];  Service: Endoscopy;;  gastric ulcer  . CARPAL TUNNEL RELEASE Right 07/09/2014   Procedure: RIGHT OPEN CARPAL TUNNEL RELEASE;  Surgeon: Mcarthur Rossetti, MD;  Location: [facility];  Service: Orthopedics;  Laterality: Right;  . CHOLECYSTECTOMY N/A 02/24/2014   Procedure: LAPAROSCOPIC CHOLECYSTECTOMY;  Surgeon: Coralie Keens, MD;  Location: [place];  Service: General;  Laterality: N/A;  . COLONOSCOPY    . CYSTOLITHOT[facility]  [facility] [place]  . CYSTOSCOPY N/A 04/18/2018   Procedure: CYSTOSCOPY FLEXIBLE;  Surgeon: Irine Seal, MD;  Location: [facility];  Service: Urology;  Laterality: N/A;  . CYSTOSCOPY W/ LITHOLAPAXY / EHL  02-24-2007   dr Janice Norrie  [facility]  . dental implant     No  teeth at this time waiting for them to be made as of 01-30-19  . ESOPHAGOGASTRODUODENOSCOPY (EGD) WITH PROPOFOL N/A 05/29/2019   Procedure: ESOPHAGOGASTRODUODENOSCOPY (EGD) WITH PROPOFOL;  Surgeon: Rogene Houston, MD;  [facility];  Service: Endoscopy;  Laterality: N/A;  925  . GOLD SEED IMPLANT N/A 09/05/2017   Procedure: GOLD SEED IMPLANT;  Surgeon: Irine Seal, MD;  Location: [facility];  Service: Urology;  Laterality: N/A;  . INSERTION OF SUPRAPUBIC CATHETER N/A 04/18/2018   Procedure: SUPRAPUBIC TUBE CHANGE;  Surgeon: Irine Seal, MD;  [facility];  Service: Urology;  Laterality: N/A;  . IR CATHETER TUBE CHANGE  02/24/2018  . SPACE OAR INSTILLATION N/A 09/05/2017   Procedure: SPACE OAR INSTILLATION;  Surgeon: Irine Seal, MD;  Location: [facility];  Service: Urology;  Laterality: N/A;  . TOTAL ANKLE ARTHROPLASTY Right 04/20/2015   Procedure: TOTAL ANKLE ARTHOPLASTY;  Surgeon: Newt Minion, MD;  Location: [place];  Service: Orthopedics;  Laterality: Right;  . TRANSTHORACIC ECHOCARDIOGRAM  11/16/2012   ef 55-60%,  grade 1 diastolic dysfunction/  mild dilated ascending aorta/  mild MR/ trivial TR  . TRANSURETHRAL RESECTION OF PROSTATE N/A 02/03/2019   Procedure: TRANSURETHRAL RESECTION OF THE PROSTATE (TURP);  Surgeon: Irine Seal, MD;  [facility];  Service: Urology;  Laterality: N/A;  . VIDEO ASSISTED THORACOSCOPY (VATS)/ LOBECTOMY Right 07/04/2015   Procedure: VIDEO ASSISTED THORACOSCOPY (VATS)/ RIGHT UPPER LOBECTOMY;  Surgeon: Melrose Nakayama, MD;  Location: [place];  Service: Thoracic;  Laterality: Right;  [name] VIDEO BRONCHOSCOPY N/A 06/17/2015   Procedure: VIDEO BRONCHOSCOPY;  Surgeon: Melrose Nakayama, MD;  Location: [place];  Service: Thoracic;  Laterality: N/A;  . VIDEO BRONCHOSCOPY WITH ENDOBRONCHIAL NAVIGATION N/A 06/17/2015   Procedure: VIDEO BRONCHOSCOPY WITH ENDOBRONCHIAL NAVIGATION;  Surgeon: Melrose Nakayama, MD;  Location: [place];  Service: Thoracic;  Laterality: N/A;  . VIDEO BRONCHOSCOPY WITH ENDOBRONCHIAL ULTRASOUND N/A 06/17/2015   Procedure: VIDEO BRONCHOSCOPY WITH ENDOBRONCHIAL ULTRASOUND;  Surgeon: Melrose Nakayama, MD;  [facility];  Service: Thoracic;  Laterality: N/A;    Family History  Problem Relation Age of Onset  . Cancer Father        Asbestos  . Colon cancer Neg Hx   . Colon polyps Neg Hx   . Kidney disease Neg Hx   . Esophageal cancer Neg Hx   . Gallbladder disease Neg Hx   . Heart disease Neg Hx   . Diabetes Neg Hx     Social history:  reports that he quit smoking about 5 years ago. His smoking use included cigarettes. He  has a 20.00 pack-year smoking history. He has never used smokeless tobacco. He reports that he does not drink alcohol and does not use drugs.    Allergies  Allergen Reactions  . Penicillins Hives  . Ampicillin   . Ciprofloxacin Other (See Comments)    Doesn't work  . Levaquin [Levofloxacin] Other (See Comments)    Doesn't work    Medications:  Prior to Admission medications   Medication Sig Start Date End Date Taking? Authorizing Provider  atenolol-chlorthalidone (TENORETIC) 50-25 MG tablet Take 1 tablet by mouth at bedtime.  12/03/18  Yes [provider]  capsaicin (ZOSTRIX) 0.025 % cream Apply 1 application topically 2 (two) times daily. 02/09/20  Yes [provider]  ciclopirox (PENLAC) 8 % solution Apply topically at bedtime. Apply over nail and surrounding skin. Apply daily over previous coat. After seven (7) days, may remove with alcohol and continue cycle.   Yes [provider]  diclofenac Sodium (VOLTAREN) 1 % GEL Apply 2 g topically 4 (four) times daily as needed (pain).   Yes [provider]  escitalopram (LEXAPRO) 5 MG tablet Take 5 mg by mouth daily.   Yes [provider]  FREESTYLE LITE test strip  09/02/19  Yes [provider]  gabapentin (NEURONTIN) 400 MG capsule Take 400 mg by mouth at  bedtime.    Yes [provider]  gabapentin (NEURONTIN) 800 MG tablet  04/16/20  Yes [provider]  hydroquinone 4 % cream Apply topically 2 (two) times daily.   Yes [provider]  ibuprofen (ADVIL) 600 MG tablet  05/26/19  Yes [provider]  Incontinence Supply Disposable (FITTED BRIEFS MAXIMUM XL) MISC Use briefs as needed daily for incontinence 06/05/19  Yes Irine Seal, MD  Lancets (FREESTYLE) lancets  09/02/19  Yes [provider]  LANTUS SOLOSTAR 100 UNIT/ML Solostar Pen Inject 2.5 Units into the skin daily. 11/28/17  Yes [provider]  Rolan Lipa 72 MCG capsule  05/21/19  Yes [provider]  metoCLOPramide (REGLAN) 5 MG tablet Take 5 mg by mouth in the morning, at noon, and at bedtime.   Yes [provider]  mupirocin cream (BACTROBAN) 2 % Apply 1 application topically 2 (two) times daily as needed (wound care).   Yes [provider]  mupirocin ointment (BACTROBAN) 2 % Apply topically. 09/10/19  Yes [provider]  nystatin ointment (MYCOSTATIN)  09/03/19  Yes [provider]  nystatin-triamcinolone ointment (MYCOLOG) Apply 1 application topically 2 (two) times daily. To groins 09/11/19  Yes Irine Seal, MD  Oxycodone HCl 20 MG TABS Take 5 mg by mouth in the morning, at noon, in the evening, and at bedtime.  02/02/18  Yes [provider]  potassium chloride (KLOR-CON) 10 MEQ tablet Take 10 mEq by mouth daily as needed.   Yes [provider]  predniSONE (DELTASONE) 20 MG tablet Take 20 mg by mouth at bedtime.   Yes [provider]  psyllium (METAMUCIL SMOOTH TEXTURE) 58.6 % powder Take 1 packet by mouth daily. Patient taking differently: Take 1 packet by mouth every other day. 04/27/19  Yes Rehman, Mechele Dawley, MD  SPIRIVA HANDIHALER 18 MCG inhalation capsule SMARTSIG:Capsule(s) Via Inhaler 03/16/20  Yes [provider]  tadalafil (CIALIS) 5 MG tablet Take 1 tablet (5  mg total) by mouth daily as needed for erectile dysfunction. 05/05/20  Yes Irine Seal, MD  terbinafine (LAMISIL) 250 MG tablet Take 250 mg by mouth daily. 09/03/19  Yes [provider]  timolol (TIMOPTIC) 0.5 % ophthalmic solution Place 1 drop into both eyes 2 (two) times daily as needed (eye pressure).   Yes [provider]  VIAGRA 100 MG tablet Take 1 tablet (100 mg total) by mouth daily as needed for erectile dysfunction. 05/09/20  Yes Irine Seal, MD    ROS:  Out of a complete 14 system review of symptoms, the patient complains only of the following symptoms, and all other reviewed systems are negative.  Walking difficulty Leg weakness  Blood pressure 126/68, pulse 83, height 6\' 8"  (2.032 m), weight 262 lb (118.8 kg).  Physical Exam  General: The patient is alert and cooperative at the time of the examination.  Skin: 1-2+ edema is seen below the knees bilaterally.   Neurologic Exam  Mental status: The patient is alert and oriented x 3 at the time of the examination. The patient has apparent normal recent and remote memory, with an apparently normal attention span and concentration ability.   Cranial nerves: Facial symmetry is present. Speech is normal, no aphasia or dysarthria is noted. Extraocular movements are full. Visual fields are full.  Motor: The patient has good strength in all 4 extremities, with exception of significant weakness of the intrinsic muscles of the right hand and bilateral foot drops.  Sensory examination: Soft touch sensation is symmetric on the face, arms, and legs.  Coordination: The patient has good finger-nose-finger and heel-to-shin bilaterally.  Gait and station: The patient has a wide-based gait.  Patient walks with a cane.  Tandem gait is not attempted.  Romberg is positive.  Reflexes: Deep tendon reflexes are symmetric, but are depressed.   Assessment/Plan:  1.  Severe diabetic peripheral neuropathy  2.  Gait  disturbance  The patient currently is not getting any medications through this office.  He would benefit from bilateral AFO braces.  He will follow up through this office on an as-needed basis in the future.  Jill Alexanders MD 05/26/2020 1:50 PM  [facility] [address] [place], [place] [address]  Phone [phone] Fax [phone]

## 2020-05-31 ENCOUNTER — Ambulatory Visit: Payer: Medicare Other

## 2020-06-07 ENCOUNTER — Ambulatory Visit (INDEPENDENT_AMBULATORY_CARE_PROVIDER_SITE_OTHER): Payer: Medicare Other

## 2020-06-07 ENCOUNTER — Other Ambulatory Visit: Payer: Self-pay

## 2020-06-07 DIAGNOSIS — R3915 Urgency of urination: Secondary | ICD-10-CM

## 2020-06-07 NOTE — Progress Notes (Signed)
PTNS  Session # monthly  Health & Social Factors: diabetic Caffeine: 1 cup Alcohol: none Daytime voids #per day: 8 Night-time voids #per night: 4 Urgency: yes Incontinence Episodes #per day: 4 Ankle used: left Treatment Setting: 8 Feeling/ Response: positive sensation from heel to leg Comments: n/a  Performed By: Durenda Guthrie, lpn  Assistant: none   Follow Up: Keep next NV

## 2020-06-09 DIAGNOSIS — Q828 Other specified congenital malformations of skin: Secondary | ICD-10-CM | POA: Diagnosis not present

## 2020-06-09 DIAGNOSIS — M79671 Pain in right foot: Secondary | ICD-10-CM | POA: Diagnosis not present

## 2020-06-27 ENCOUNTER — Telehealth: Payer: Self-pay

## 2020-06-27 NOTE — Telephone Encounter (Signed)
Pt called saying he needed a paper copy of Nystatin medication that was given to him by Dr. Jeffie Pollock. Then he wanted a copy of next appointment. Both were printed and left at front.

## 2020-06-28 DIAGNOSIS — K5901 Slow transit constipation: Secondary | ICD-10-CM | POA: Diagnosis not present

## 2020-06-28 DIAGNOSIS — Z85118 Personal history of other malignant neoplasm of bronchus and lung: Secondary | ICD-10-CM | POA: Diagnosis not present

## 2020-06-28 DIAGNOSIS — H8110 Benign paroxysmal vertigo, unspecified ear: Secondary | ICD-10-CM | POA: Diagnosis not present

## 2020-06-28 DIAGNOSIS — E0865 Diabetes mellitus due to underlying condition with hyperglycemia: Secondary | ICD-10-CM | POA: Diagnosis not present

## 2020-06-28 DIAGNOSIS — Z6828 Body mass index (BMI) 28.0-28.9, adult: Secondary | ICD-10-CM | POA: Diagnosis not present

## 2020-06-28 DIAGNOSIS — H9190 Unspecified hearing loss, unspecified ear: Secondary | ICD-10-CM | POA: Diagnosis not present

## 2020-06-28 DIAGNOSIS — I700 Atherosclerosis of aorta: Secondary | ICD-10-CM | POA: Diagnosis not present

## 2020-06-28 DIAGNOSIS — R531 Weakness: Secondary | ICD-10-CM | POA: Diagnosis not present

## 2020-06-28 DIAGNOSIS — I10 Essential (primary) hypertension: Secondary | ICD-10-CM | POA: Diagnosis not present

## 2020-06-30 ENCOUNTER — Ambulatory Visit: Payer: Medicare Other

## 2020-07-07 ENCOUNTER — Ambulatory Visit: Payer: Medicare Other

## 2020-07-07 ENCOUNTER — Other Ambulatory Visit: Payer: Self-pay

## 2020-07-07 ENCOUNTER — Ambulatory Visit (INDEPENDENT_AMBULATORY_CARE_PROVIDER_SITE_OTHER): Payer: Medicare Other

## 2020-07-07 DIAGNOSIS — R350 Frequency of micturition: Secondary | ICD-10-CM | POA: Diagnosis not present

## 2020-07-07 MED ORDER — GEMTESA 75 MG PO TABS: 1.0000 | ORAL_TABLET | Freq: Every day | ORAL | 0 refills | Status: DC

## 2020-07-07 NOTE — Progress Notes (Signed)
PTNS  Session # monthly  Health & Social Factors: diabetic Caffeine: 1 cup daily Alcohol: none Daytime voids #per day: 6 Night-time voids #per night: 4 Urgency: yes Incontinence Episodes #per day: 4 Ankle used: left Treatment Setting: 9 Feeling/ Response: positive sensation from toe to heal Comments: none  Performed By: Valory Wetherby,Lpn  Assistant: none  Follow Up: keep next scheduled NV  1 month of Gemtesa samples given

## 2020-07-07 NOTE — Patient Instructions (Signed)
Percutaneous Nerve Evaluation for Sacral Nerve Stimulation  Sacral nerve stimulation (SNS) is a treatment that uses an implanted device that sends mild electrical impulses to the sacral nerves. The sacral nerves control several functions in the lower part of the body, including bladder and bowel functions. Sacral nerve stimulation can be used to treat disorders that make it hard to control urination and bowel movements. Before having a sacral nerve stimulator implanted, you may go through a trial known as a percutaneous nerve evaluation. This trial helps determine if SNS will help your condition. For the trial, an outpatient procedure will be done to insert temporary wires into your body. Electric pulses will travel through these wires (electrodes) to an area close to your sacral nerves. The electrodes are connected to the temporary nerve stimulator, which remains outside of your body. The nerve stimulator will send electric pulses during the trial period, which usually lasts 1-2 weeks. Tell a health care provider about:  Any allergies you have.  All medicines you are taking, including vitamins, herbs, eye drops, creams, and over-the-counter medicines.  Any problems you or family members have had with anesthetic medicines.  Any blood disorders you have.  Any surgeries you have had.  Any medical conditions you have.  Whether you are pregnant or may be pregnant. What are the risks? Generally, this is a safe procedure. However, problems may occur, including:  Movement of the electrode away from the place where it was inserted (migration).  Infection.  Bleeding.  Damage to nearby structures or organs, such as nerves near the spine.  Uncomfortable sensations, such as a jolting or shocking feeling.  Allergic reactions to medicines. What happens before the procedure? Medicines Ask your health care provider about:  Changing or stopping your regular medicines. This is especially important  if you are taking diabetes medicines or blood thinners.  Taking medicines such as aspirin and ibuprofen. These medicines can thin your blood. Do not take these medicines unless your health care provider tells you to take them.  Taking over-the-counter medicines, vitamins, herbs, and supplements. General instructions  Follow instructions from your health care provider about eating or drinking restrictions.  Ask your health care provider what steps will be taken to help prevent infection. These steps may include: ? Removing hair at the procedure site. ? Washing skin with a germ-killing soap. ? Taking antibiotic medicine. What happens during the procedure?  You will be given a medicine to numb the area (local anesthetic).  Long needles will be inserted into your lower back.  The needles will be guided to the place where the nerves exit the backbone. Your health care provider may use a type of X-ray (fluoroscopy) to guide the needles to the right spot.  The position of the needles will be tested. If they are in the right spot, your toes or feet may move. If you are awake, you may feel a tingling in your legs.  Electrodes will be inserted through the needles and into your body.  The electrodes will be anchored in place close to your sacral nerves.  The ends of the electrodes outside your body will be connected to a nerve stimulator device.  A bandage (dressing) will be applied to cover the electrodes.  Your health care provider will program the rate at which the nerve stimulator will deliver the electric pulses. The procedure may vary among health care providers and hospitals. What happens after the procedure?  Your blood pressure, heart rate, breathing rate, and blood oxygen  level will be monitored until you leave the hospital or clinic.  You will wear the sacral nerve stimulator for the trial period. You will be taught how to use the device.  You may be given pain medicine as  needed. Summary  Sacral nerve stimulation (SNS) is a treatment that uses an implanted device that sends mild electrical impulses to the sacral nerves.  This procedure will determine if a sacral nerve stimulator will help you. If so, you will need to have a second procedure to have a permanent device implanted.  The sacral nerves control several functions in the lower part of the body, including bladder and bowel functions.  SNS can be used to treat disorders that make it hard to control urination and bowel movements. This information is not intended to replace advice given to you by your health care provider. Make sure you discuss any questions you have with your health care provider. Document Revised: 10/16/2019 Document Reviewed: 10/16/2019 Elsevier Patient Education  [place].

## 2020-07-14 DIAGNOSIS — F411 Generalized anxiety disorder: Secondary | ICD-10-CM | POA: Diagnosis not present

## 2020-07-14 DIAGNOSIS — Z6828 Body mass index (BMI) 28.0-28.9, adult: Secondary | ICD-10-CM | POA: Diagnosis not present

## 2020-07-28 ENCOUNTER — Other Ambulatory Visit: Payer: Self-pay

## 2020-07-28 ENCOUNTER — Other Ambulatory Visit: Payer: Medicare Other

## 2020-07-28 DIAGNOSIS — Z8546 Personal history of malignant neoplasm of prostate: Secondary | ICD-10-CM | POA: Diagnosis not present

## 2020-07-29 LAB — TESTOSTERONE: Testosterone: 286 ng/dL (ref 264–916)

## 2020-07-29 LAB — PSA: Prostate Specific Ag, Serum: <0.1 ng/mL (ref 0.0–4.0)

## 2020-08-02 NOTE — Progress Notes (Signed)
Sent via mychart

## 2020-08-04 ENCOUNTER — Ambulatory Visit: Payer: Medicare Other | Admitting: Urology

## 2020-08-04 DIAGNOSIS — M79672 Pain in left foot: Secondary | ICD-10-CM | POA: Diagnosis not present

## 2020-08-04 DIAGNOSIS — M79675 Pain in left toe(s): Secondary | ICD-10-CM | POA: Diagnosis not present

## 2020-08-04 DIAGNOSIS — M79671 Pain in right foot: Secondary | ICD-10-CM | POA: Diagnosis not present

## 2020-08-04 DIAGNOSIS — M79674 Pain in right toe(s): Secondary | ICD-10-CM | POA: Diagnosis not present

## 2020-08-04 DIAGNOSIS — L110 Acquired keratosis follicularis: Secondary | ICD-10-CM | POA: Diagnosis not present

## 2020-08-04 DIAGNOSIS — I739 Peripheral vascular disease, unspecified: Secondary | ICD-10-CM | POA: Diagnosis not present

## 2020-08-04 DIAGNOSIS — E1140 Type 2 diabetes mellitus with diabetic neuropathy, unspecified: Secondary | ICD-10-CM | POA: Diagnosis not present

## 2020-08-08 ENCOUNTER — Ambulatory Visit (INDEPENDENT_AMBULATORY_CARE_PROVIDER_SITE_OTHER): Payer: Medicare Other

## 2020-08-08 ENCOUNTER — Other Ambulatory Visit: Payer: Self-pay

## 2020-08-08 DIAGNOSIS — R350 Frequency of micturition: Secondary | ICD-10-CM

## 2020-08-08 NOTE — Progress Notes (Signed)
PTNS  Session # 5  Health & Social Factors: diabetes, CHF Caffeine: 1 cup Alcohol: none Daytime voids #per day: 5 Night-time voids #per night: 2 Urgency: yes Incontinence Episodes #per day: 1 Ankle used: left Treatment Setting: 8 Feeling/ Response: strong sensation Comments: none  Performed By: Estill Bamberg RN  Assistant: none  Follow Up: 1 week NV

## 2020-08-08 NOTE — Patient Instructions (Addendum)
Percutaneous Nerve Evaluation for Sacral Nerve Stimulation  Sacral nerve stimulation (SNS) is a treatment that uses an implanted device that sends mild electrical impulses to the sacral nerves. The sacral nerves control several functions in the lower part of the body, including bladder and bowel functions. Sacral nerve stimulation can be used to treat disorders that make it hard to control urination and bowel movements. Before having a sacral nerve stimulator implanted, you may go through a trial known as a percutaneous nerve evaluation. This trial helps determine if SNS will help your condition. For the trial, an outpatient procedure will be done to insert temporary wires into your body. Electric pulses will travel through these wires (electrodes) to an area close to your sacral nerves. The electrodes are connected to the temporary nerve stimulator, which remains outside of your body. The nerve stimulator will send electric pulses during the trial period, which usually lasts 1-2 weeks. Tell a health care provider about:  Any allergies you have.  All medicines you are taking, including vitamins, herbs, eye drops, creams, and over-the-counter medicines.  Any problems you or family members have had with anesthetic medicines.  Any blood disorders you have.  Any surgeries you have had.  Any medical conditions you have.  Whether you are pregnant or may be pregnant. What are the risks? Generally, this is a safe procedure. However, problems may occur, including:  Movement of the electrode away from the place where it was inserted (migration).  Infection.  Bleeding.  Damage to nearby structures or organs, such as nerves near the spine.  Uncomfortable sensations, such as a jolting or shocking feeling.  Allergic reactions to medicines. What happens before the procedure? Medicines Ask your health care provider about:  Changing or stopping your regular medicines. This is especially important  if you are taking diabetes medicines or blood thinners.  Taking medicines such as aspirin and ibuprofen. These medicines can thin your blood. Do not take these medicines unless your health care provider tells you to take them.  Taking over-the-counter medicines, vitamins, herbs, and supplements. General instructions  Follow instructions from your health care provider about eating or drinking restrictions.  Ask your health care provider what steps will be taken to help prevent infection. These steps may include: ? Removing hair at the procedure site. ? Washing skin with a germ-killing soap. ? Taking antibiotic medicine. What happens during the procedure?  You will be given a medicine to numb the area (local anesthetic).  Long needles will be inserted into your lower back.  The needles will be guided to the place where the nerves exit the backbone. Your health care provider may use a type of X-ray (fluoroscopy) to guide the needles to the right spot.  The position of the needles will be tested. If they are in the right spot, your toes or feet may move. If you are awake, you may feel a tingling in your legs.  Electrodes will be inserted through the needles and into your body.  The electrodes will be anchored in place close to your sacral nerves.  The ends of the electrodes outside your body will be connected to a nerve stimulator device.  A bandage (dressing) will be applied to cover the electrodes.  Your health care provider will program the rate at which the nerve stimulator will deliver the electric pulses. The procedure may vary among health care providers and hospitals. What happens after the procedure?  Your blood pressure, heart rate, breathing rate, and blood oxygen  level will be monitored until you leave the hospital or clinic.  You will wear the sacral nerve stimulator for the trial period. You will be taught how to use the device.  You may be given pain medicine as  needed. Summary  Sacral nerve stimulation (SNS) is a treatment that uses an implanted device that sends mild electrical impulses to the sacral nerves.  This procedure will determine if a sacral nerve stimulator will help you. If so, you will need to have a second procedure to have a permanent device implanted.  The sacral nerves control several functions in the lower part of the body, including bladder and bowel functions.  SNS can be used to treat disorders that make it hard to control urination and bowel movements. This information is not intended to replace advice given to you by your health care provider. Make sure you discuss any questions you have with your health care provider. Document Revised: 10/16/2019 Document Reviewed: 10/16/2019 Elsevier Patient Education  [place].

## 2020-08-11 DIAGNOSIS — M79675 Pain in left toe(s): Secondary | ICD-10-CM | POA: Diagnosis not present

## 2020-08-11 DIAGNOSIS — M79671 Pain in right foot: Secondary | ICD-10-CM | POA: Diagnosis not present

## 2020-08-11 DIAGNOSIS — M25579 Pain in unspecified ankle and joints of unspecified foot: Secondary | ICD-10-CM | POA: Diagnosis not present

## 2020-08-18 ENCOUNTER — Ambulatory Visit (INDEPENDENT_AMBULATORY_CARE_PROVIDER_SITE_OTHER): Payer: Medicare Other | Admitting: Urology

## 2020-08-18 ENCOUNTER — Other Ambulatory Visit: Payer: Self-pay

## 2020-08-18 ENCOUNTER — Encounter: Payer: Self-pay | Admitting: Urology

## 2020-08-18 VITALS — BP 145/82 | HR 88 | Temp 98.0°F | Ht >= 80 in | Wt 262.0 lb

## 2020-08-18 DIAGNOSIS — N3941 Urge incontinence: Secondary | ICD-10-CM

## 2020-08-18 DIAGNOSIS — R3912 Poor urinary stream: Secondary | ICD-10-CM

## 2020-08-18 DIAGNOSIS — R350 Frequency of micturition: Secondary | ICD-10-CM

## 2020-08-18 DIAGNOSIS — N5201 Erectile dysfunction due to arterial insufficiency: Secondary | ICD-10-CM | POA: Diagnosis not present

## 2020-08-18 DIAGNOSIS — Z8546 Personal history of malignant neoplasm of prostate: Secondary | ICD-10-CM | POA: Diagnosis not present

## 2020-08-18 LAB — MICROSCOPIC EXAMINATION
Bacteria, UA: NONE SEEN
Epithelial Cells (non renal): NONE SEEN /hpf (ref 0–10)
RBC, Urine: NONE SEEN /hpf (ref 0–2)
Renal Epithel, UA: NONE SEEN /hpf
WBC, UA: NONE SEEN /hpf (ref 0–5)

## 2020-08-18 LAB — URINALYSIS, ROUTINE W REFLEX MICROSCOPIC
Bilirubin, UA: NEGATIVE
Glucose, UA: NEGATIVE
Ketones, UA: NEGATIVE
Leukocytes,UA: NEGATIVE
Nitrite, UA: NEGATIVE
RBC, UA: NEGATIVE
Specific Gravity, UA: 1.025 (ref 1.005–1.030)
Urobilinogen, Ur: 2 mg/dL — ABNORMAL HIGH (ref 0.2–1.0)
pH, UA: 6 (ref 5.0–7.5)

## 2020-08-18 MED ORDER — VIAGRA 100 MG PO TABS: 100.0000 mg | ORAL_TABLET | Freq: Every day | ORAL | 11 refills | Status: DC | PRN

## 2020-08-18 NOTE — Progress Notes (Signed)
Subjective:  1. Urinary frequency   2. Urge incontinence   3. Erectile dysfunction due to arterial insufficiency   4. History of prostate cancer   5. Weak urinary stream     Jonathan [name] returns today in f/u.  He has OAB wet and is has been on PTNS maintenance and Gemtesa samples.  He remains on tadalafil daily and viagra prn for ED. He is doing better with reduced urgency and UUI.  He has some intermittency.  His IPSS is 17     He has a history of T3b N0 M0 Gleason 9 prostate cancer that was treated with EXRT and ADT.   He completed EXRT in 9/19.   His final  Lupron injection was given 1/21.  His PSA remains <0.1 on 07/28/20 and  his testosterone has gone up to 286 from 221.      He has been using some Viagra and tadalafil for ED with a reasonable  benefit.     ROS:  ROS:  A complete review of systems was performed.  All systems are negative except for pertinent findings as noted.   ROS  Allergies  Allergen Reactions  . Penicillins Hives  . Ampicillin   . Ciprofloxacin Other (See Comments)    Doesn't work  . Levaquin [Levofloxacin] Other (See Comments)    Doesn't work    Outpatient Encounter Medications as of 08/18/2020  Medication Sig  . atenolol-chlorthalidone (TENORETIC) 50-25 MG tablet Take 1 tablet by mouth at bedtime.   . capsaicin (ZOSTRIX) 0.025 % cream Apply 1 application topically 2 (two) times daily.  . ciclopirox (PENLAC) 8 % solution Apply topically at bedtime. Apply over nail and surrounding skin. Apply daily over previous coat. After seven (7) days, may remove with alcohol and continue cycle.  . diclofenac Sodium (VOLTAREN) 1 % GEL Apply 2 g topically 4 (four) times daily as needed (pain).  [name] escitalopram (LEXAPRO) 5 MG tablet Take 5 mg by mouth daily.  [name] FREESTYLE LITE test strip   . gabapentin (NEURONTIN) 300 MG capsule Take 1 capsule (300 mg total) by mouth 2 (two) times daily.  . hydroquinone 4 % cream Apply topically 2 (two) times daily.  [name] ibuprofen (ADVIL)  600 MG tablet   . Incontinence Supply Disposable (FITTED BRIEFS MAXIMUM XL) MISC Use briefs as needed daily for incontinence  . Lancets (FREESTYLE) lancets   . LANTUS SOLOSTAR 100 UNIT/ML Solostar Pen Inject 2.5 Units into the skin daily.  [name] LINZESS 72 MCG capsule   . metoCLOPramide (REGLAN) 5 MG tablet Take 5 mg by mouth in the morning, at noon, and at bedtime.  . mupirocin cream (BACTROBAN) 2 % Apply 1 application topically 2 (two) times daily as needed (wound care).  . mupirocin ointment (BACTROBAN) 2 % Apply topically.  . nystatin ointment (MYCOSTATIN)   . nystatin-triamcinolone ointment (MYCOLOG) Apply 1 application topically 2 (two) times daily. To groins  . Oxycodone HCl 20 MG TABS Take 5 mg by mouth in the morning, at noon, in the evening, and at bedtime.   . potassium chloride (KLOR-CON) 10 MEQ tablet Take 10 mEq by mouth daily as needed.  . predniSONE (DELTASONE) 20 MG tablet Take 20 mg by mouth at bedtime.  . psyllium (METAMUCIL SMOOTH TEXTURE) 58.6 % powder Take 1 packet by mouth daily. (Patient taking differently: Take 1 packet by mouth every other day.)  . SPIRIVA HANDIHALER 18 MCG inhalation capsule SMARTSIG:Capsule(s) Via Inhaler  . tadalafil (CIALIS) 5 MG tablet Take 1 tablet (5 mg  total) by mouth daily as needed for erectile dysfunction.  . terbinafine (LAMISIL) 250 MG tablet Take 250 mg by mouth daily.  . timolol (TIMOPTIC) 0.5 % ophthalmic solution Place 1 drop into both eyes 2 (two) times daily as needed (eye pressure).  . Vibegron (GEMTESA) 75 MG TABS Take 1 tablet by mouth daily.  . [DISCONTINUED] VIAGRA 100 MG tablet Take 1 tablet (100 mg total) by mouth daily as needed for erectile dysfunction.  [name] VIAGRA 100 MG tablet Take 1 tablet (100 mg total) by mouth daily as needed for erectile dysfunction.   No facility-administered encounter medications on file as of 08/18/2020.    Past Medical History:  Diagnosis Date  . Acquired bladder diverticulum    12/ 2012  resection  diverticulum done at [facility] in [place], [place]  . Age-related cataract of right eye    scheduled for catarat extraction 09-12-2017  . Agent orange exposure   . Aneurysm of infrarenal abdominal aorta ([place])    first dx 2017--- last CT 06-11-2017  measures 3.5cm  . Chronic constipation   . COPD with emphysema ([place])    06--12-2017  per pt no symptoms since Feb 2019  . Depression    PTSD  . Diabetes mellitus type 2, diet-controlled ([place])   . Diabetic neuropathy ([place])   . Dyslipidemia   . Dyspnea on exertion   . Erectile dysfunction   . Feeling of incomplete bladder emptying   . Gait abnormality 01/06/2020  . GERD (gastroesophageal reflux disease)   . Gout    09-02-2017 last flare up 3 months ago  . Hiatal hernia   . History of atrial fibrillation    episode post op right lung lobectomy 07-04-2015  . History of bladder stone   . History of colon polyps   . History of DVT of lower extremity    03/ 2019  bilateral lower extremity (superficial) ---  per treated w/ oral medication   . History of gastritis   . History of kidney stones   . History of radiation therapy 09-14-2015  to 09-21-2015   left upper lung nodule -- 54Gy in 3 fractions (18 Gy per fraction)  . Hyperplasia of prostate with lower urinary tract symptoms (LUTS)   . Hypertension   . Lumbar spinal stenosis   . Nephrolithiasis   . Neurogenic bladder   . Osteoarthritis    ankle, hands  . Osteoporosis   . Prostate cancer [facility] urologist-  dr Mort Smelser/  oncologist-  dr Tammi Klippel   dx 05-24-2017-- Stage T2b,  Gleason 4+4,  PSA 22.41,  vol 38cc--- started ADT 04/ 2019,  plan external radiation therapy  . Radiation fibrosis of lung ([place])    hx left upper long nodule SBRT 06/ 2017  . Squamous cell carcinoma of both lungs ([place]) last CT in epic dated 06-26-2017 no recurrence   dx 03/ 2017  non-small cell SCC via bronchoscopy w/ bx's by dr Roxan Hockey---  s/p  right VATS w/ right lobectomy and node dissection's 07-04-2015 /  pt had SBRT to  left upper nodule Stage 1 ([facility]) completed 09-21-2015  . Wears dentures    bottom  . Wears glasses     Past Surgical History:  Procedure Laterality Date  . BIOPSY  05/29/2019   Procedure: BIOPSY;  Surgeon: Rogene Houston, MD;  [facility];  Service: Endoscopy;;  gastric ulcer  . CARPAL TUNNEL RELEASE Right 07/09/2014   Procedure: RIGHT OPEN CARPAL TUNNEL RELEASE;  Surgeon: Lind Guest  Ninfa Linden, MD;  [facility];  Service: Orthopedics;  Laterality: Right;  . CHOLECYSTECTOMY N/A 02/24/2014   Procedure: LAPAROSCOPIC CHOLECYSTECTOMY;  Surgeon: Coralie Keens, MD;  Location: [place];  Service: General;  Laterality: N/A;  . COLONOSCOPY    . CYSTOLITHOT[facility]Y  [facility] [place]  . CYSTOSCOPY N/A 04/18/2018   Procedure: CYSTOSCOPY FLEXIBLE;  Surgeon: Irine Seal, MD;  [facility];  Service: Urology;  Laterality: N/A;  . CYSTOSCOPY W/ LITHOLAPAXY / EHL  02-24-2007   dr Janice Norrie  [facility]  . dental implant     No teeth at this time waiting for them to be made as of 01-30-19  . ESOPHAGOGASTRODUODENOSCOPY (EGD) WITH PROPOFOL N/A 05/29/2019   Procedure: ESOPHAGOGASTRODUODENOSCOPY (EGD) WITH PROPOFOL;  Surgeon: Rogene Houston, MD;  [facility];  Service: Endoscopy;  Laterality: N/A;  925  . GOLD SEED IMPLANT N/A 09/05/2017   Procedure: GOLD SEED IMPLANT;  Surgeon: Irine Seal, MD;  Location[facility];  Service: Urology;  Laterality: N/A;  . INSERTION OF SUPRAPUBIC CATHETER N/A 04/18/2018   Procedure: SUPRAPUBIC TUBE CHANGE;  Surgeon: Irine Seal, MD;  [facility];  Service: Urology;  Laterality: N/A;  . IR CATHETER TUBE CHANGE  02/24/2018  . SPACE OAR INSTILLATION N/A 09/05/2017   Procedure: SPACE OAR INSTILLATION;  Surgeon: Irine Seal, MD;  Location: [facility];  Service: Urology;  Laterality: N/A;  . TOTAL ANKLE ARTHROPLASTY Right 04/20/2015   Procedure: TOTAL ANKLE ARTHOPLASTY;  Surgeon: Newt Minion, MD;  Location: [place];  Service: Orthopedics;  Laterality: Right;  . TRANSTHORACIC ECHOCARDIOGRAM  11/16/2012   ef 55-60%,  grade 1 diastolic dysfunction/  mild dilated ascending aorta/  mild MR/ trivial TR  . TRANSURETHRAL RESECTION OF PROSTATE N/A 02/03/2019   Procedure: TRANSURETHRAL RESECTION OF THE PROSTATE (TURP);  Surgeon: Irine Seal, MD;  [facility];  Service: Urology;  Laterality: N/A;  . VIDEO ASSISTED THORACOSCOPY (VATS)/ LOBECTOMY Right 07/04/2015   Procedure: VIDEO ASSISTED THORACOSCOPY (VATS)/ RIGHT UPPER LOBECTOMY;  Surgeon: Melrose Nakayama, MD;  Location: [place];  Service: Thoracic;  Laterality: Right;  [name] VIDEO BRONCHOSCOPY N/A 06/17/2015   Procedure: VIDEO BRONCHOSCOPY;  Surgeon: Melrose Nakayama, MD;  Location: [place];  Service: Thoracic;  Laterality: N/A;  . VIDEO BRONCHOSCOPY WITH ENDOBRONCHIAL NAVIGATION N/A 06/17/2015   Procedure: VIDEO BRONCHOSCOPY WITH ENDOBRONCHIAL NAVIGATION;  Surgeon: Melrose Nakayama, MD;  Location: [place];  Service: Thoracic;  Laterality: N/A;  . VIDEO BRONCHOSCOPY WITH ENDOBRONCHIAL ULTRASOUND N/A 06/17/2015   Procedure: VIDEO BRONCHOSCOPY WITH ENDOBRONCHIAL ULTRASOUND;  Surgeon: Melrose Nakayama, MD;  Location: [place];  Service: Thoracic;  Laterality: N/A;    Social History   Socioeconomic History  . Marital status: Married    Spouse name: Jerl Santos  . Number of children: 2  . Years of education: college  . Highest education level: Not on file  Occupational History  . Occupation: retired  Tobacco Use  . Smoking status: Former Smoker    Packs/day: 0.50    Years: 40.00    Pack years: 20.00    Types: Cigarettes    Quit date: 02/13/2015    Years since quitting: 5.5  . Smokeless tobacco: Never Used  Vaping Use  . Vaping Use: Never used  Substance and Sexual Activity  . Alcohol use: No    Alcohol/week: 0.0 standard drinks  . Drug use: No  . Sexual activity: Not Currently  Other Topics Concern  . Not on file   Social History Narrative   Lives with wife, daughter and son in law temporarily to help them out   Right Handed   Drinks caffeine occassionally   Social Determinants of Health   Financial Resource Strain: Not on file  Food Insecurity: Not on file  Transportation Needs: Not on file  Physical Activity: Not on file  Stress: Not on file  Social Connections: Not on file  Intimate Partner Violence: Not on file    Family History  Problem Relation Age of Onset  . Cancer Father        Asbestos  . Colon cancer Neg Hx   . Colon polyps Neg Hx   . Kidney disease Neg Hx   . Esophageal cancer Neg Hx   . Gallbladder disease Neg Hx   . Heart disease Neg Hx   . Diabetes Neg Hx        Objective: Vitals:   08/18/20 0949  BP: (!) 145/82  Pulse: 88  Temp: 98 F (36.7 C)     Physical Exam  Lab Results:  Results for orders placed or performed in visit on 08/18/20 (from the past 24 hour(s))  Urinalysis, Routine w reflex microscopic     Status: Abnormal   Collection Time: 08/18/20  3:29 PM  Result Value Ref Range   Specific Gravity, UA 1.025 1.005 - 1.030   pH, UA 6.0 5.0 - 7.5   Color, UA Amber (A) Yellow   Appearance Ur Clear Clear   Leukocytes,UA Negative Negative   Protein,UA 1+ (A) Negative/Trace   Glucose, UA Negative Negative   Ketones, UA Negative Negative   RBC, UA Negative Negative   Bilirubin, UA Negative Negative   Urobilinogen, Ur 2.0 (H) 0.2 - 1.0 mg/dL   Nitrite, UA Negative Negative   Microscopic Examination See below:    Narrative   Performed at:  [facility] [address], [place], [place]  [id number] Lab Director: Mina Marble MT, Phone:  [phone]  Microscopic Examination     Status: None   Collection Time: 08/18/20  3:29 PM   Urine  Result Value Ref Range   WBC, UA None seen 0 - 5 /hpf   RBC None seen 0 - 2 /hpf   Epithelial Cells (non renal) None seen 0 - 10 /hpf   Renal Epithel, UA None seen None seen /hpf   Casts Present  None seen /lpf   Cast Type Hyaline casts N/A   Mucus, UA Present Not Estab.   Bacteria, UA None seen None seen/Few   Narrative   Performed at:  [facility] [address], [place], [place]  [id number] Lab Director: [place], Phone:  [phone]    BMET No results for input(s): NA, K, CL, CO2, GLUCOSE, BUN, CREATININE, CALCIUM in the last 72 hours. PSA Lab Results  Component Value Date   PSA1 <0.1 07/28/2020   PSA1 <0.1 03/24/2020    PSA  Date Value Ref Range Status  08/21/2019 <0.1 < OR = 4.0 ng/mL Final    Comment:    The total PSA value from this assay system is  standardized against the WHO standard. The test  result will be approximately 20% lower when compared  to the equimolar-standardized total PSA (Beckman  Coulter). Comparison of serial PSA results should be  interpreted with this fact in mind. . This test was performed using the Siemens  chemiluminescent method. Values obtained from  different assay methods cannot be used interchangeably. PSA levels, regardless of value, should not be interpreted as absolute evidence of the presence or absence of disease.   04/06/2019 <0.1 < OR = 4.0 ng/mL Final    Comment:    The total PSA value from this assay system is  standardized against the WHO standard. The test  result will be approximately 20% lower when compared  to the equimolar-standardized total PSA (Beckman  Coulter). Comparison of serial PSA results should be  interpreted with this fact in mind. . This test was performed using the Siemens  chemiluminescent method. Values obtained from  different assay methods cannot be used interchangeably. PSA levels, regardless of value, should not be interpreted as absolute evidence of the presence or absence of disease.   10/26/2008 0.69 0.10 - 4.00 ng/mL Final    Comment:    See lab report for associated comment(s)   Testosterone  Date Value Ref Range Status  07/28/2020 286 264 - 916 ng/dL  Final    Comment:    Adult male reference interval is based on a population of healthy nonobese males (BMI <30) between 66 and 84 years old. [place], [place] [phone]. PMID: [id number].   03/24/2020 221 (L) 264 - 916 ng/dL Final    Comment:    Adult male reference interval is based on a population of healthy nonobese males (BMI <30) between 42 and 43 years old. [place], [place] [phone]. PMID: [id number].   08/21/2019 15 (L) 250 - 827 ng/dL Final    Comment:    In hypogonadal males, Testosterone, Total, LC/MS/MS, is the recommended assay due to the diminished accuracy of immunoassay at levels below 250 ng/dL. This test code [phone]) must be collected in a red-top tube with no gel.        Studies/Results:   Assessment & Plan: Prostate cancer.   His PSA remains undetectible with testosterone recovery.   OAB with incontinence with an elevated PVR.  He is doing better with the PTNS and Gemtesa.  Additional samples given..   Recurrent UTI's.  I will get a culture today and if it is positive I will treat.    ED.  I will refill the Viagra and he will continue  tadalafil for daily use for the ED and voiding symptoms. .   Meds ordered this encounter  Medications  . VIAGRA 100 MG tablet    Sig: Take 1 tablet (100 mg total) by mouth daily as needed for erectile dysfunction.    Dispense:  30 tablet    Refill:  11    Patient would like the brand name med.     Orders Placed This Encounter  Procedures  . Microscopic Examination  . PSA    Standing Status:   Future    Standing Expiration Date:   08/18/2021  . Testosterone    Standing Status:   Future    Standing Expiration Date:   08/18/2021  . Urinalysis, Routine w reflex microscopic      Return in about 6 months (around 02/18/2021) for continue monthly PTNS and return in 11mo with a PSA and testosterone. .   CCLanelle Bal, PA-C      Irine Seal 08/19/2020 Patient ID: [name], male    DOB: 21-Aug-1940, 80 y.o.   MRN: [id number]

## 2020-08-18 NOTE — Progress Notes (Signed)
Urological Symptom Review  Patient is experiencing the following symptoms: Trouble starting stream Erection problems   Review of Systems  Gastrointestinal (upper)  : Negative for upper GI symptoms  Gastrointestinal (lower) : Constipation  Constitutional : Negative for symptoms  Skin: Skin rash/lesion Itching  Eyes: Blurred vision  Ear/Nose/Throat : Negative for Ear/Nose/Throat symptoms  Hematologic/Lymphatic: Negative for Hematologic/Lymphatic symptoms  Cardiovascular : Leg swelling  Respiratory : Shortness of breath  Endocrine: Excessive thirst  Musculoskeletal: Back pain Joint pain  Neurological: Dizziness  Psychologic: Negative for psychiatric symptoms

## 2020-08-23 DIAGNOSIS — H903 Sensorineural hearing loss, bilateral: Secondary | ICD-10-CM | POA: Diagnosis not present

## 2020-08-23 IMAGING — CT CT ABD-PELV W/ CM
2 of 5 series · 15 of 46 positions shown, 17 images · IV contrast (iopamidol)
Comparison: CT 12/11/2017, 06/11/2017, 02/07/2017

CLINICAL DATA: Pelvic pain

EXAM:
CT ABDOMEN AND PELVIS WITH CONTRAST
TECHNIQUE: Multidetector CT imaging of the abdomen and pelvis was performed
using the standard protocol following bolus administration of
intravenous contrast.
CONTRAST:  100mL J93NL8-ITT IOPAMIDOL (J93NL8-ITT) INJECTION 61%

[Series 2: axial st · axial · 0.76mm/px · z∈[-527,-117]mm · 12 of 96 slices shown, 14 images]
[im 7/96  soft-tissue]
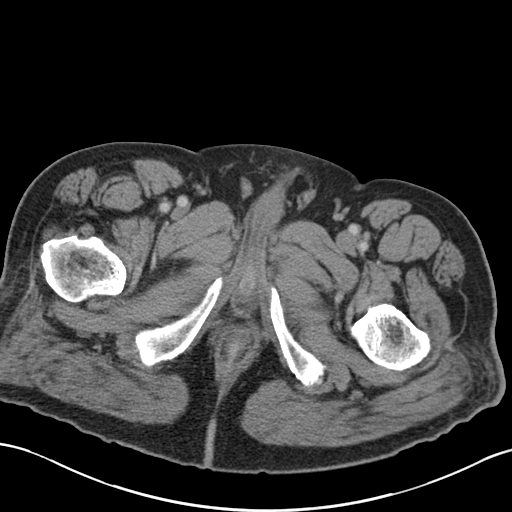
[im 7/96  bone]
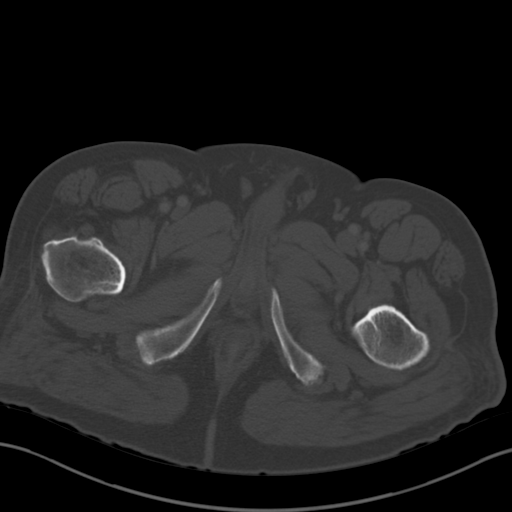
[im 13/96  soft-tissue]
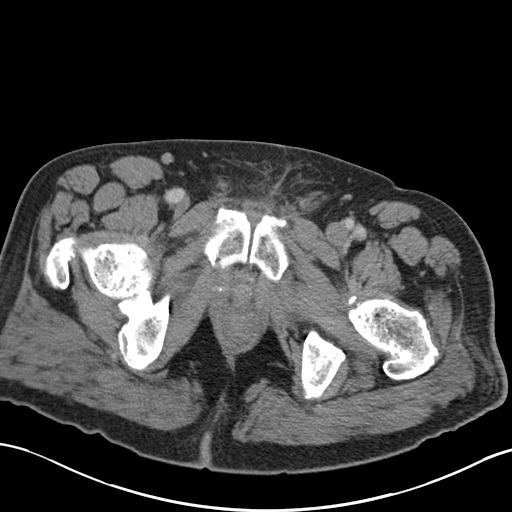
[im 20/96  soft-tissue]
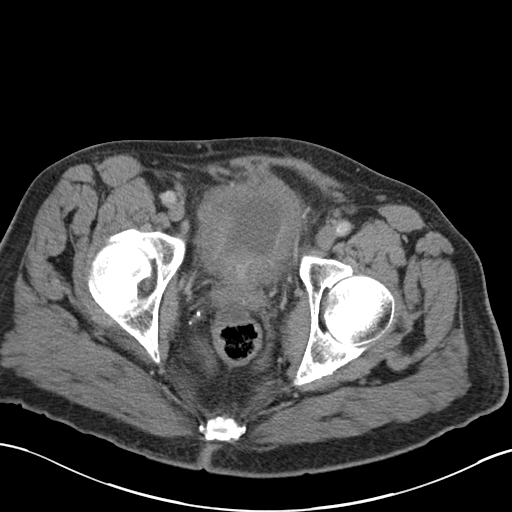
[im 32/96  soft-tissue]
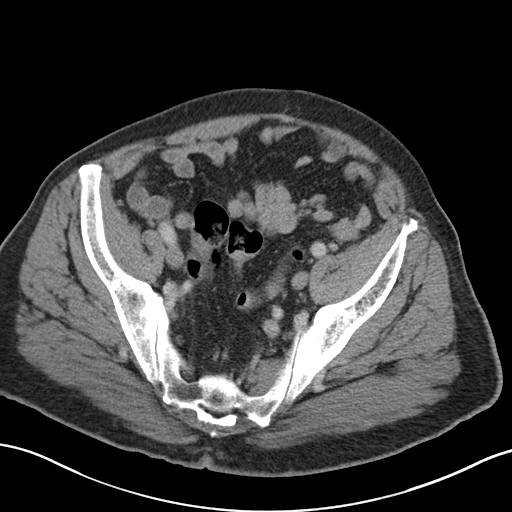
[im 39/96  soft-tissue]
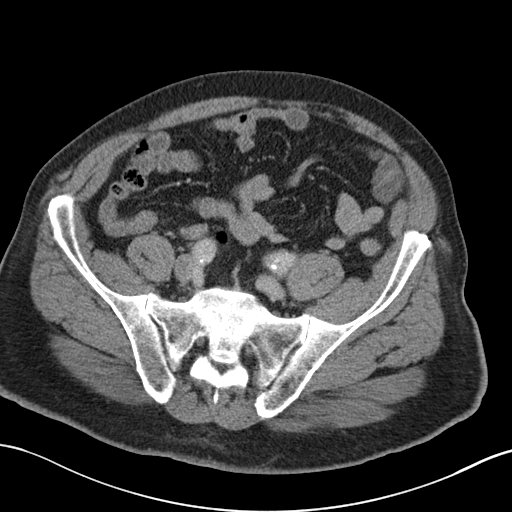
[im 45/96  soft-tissue]
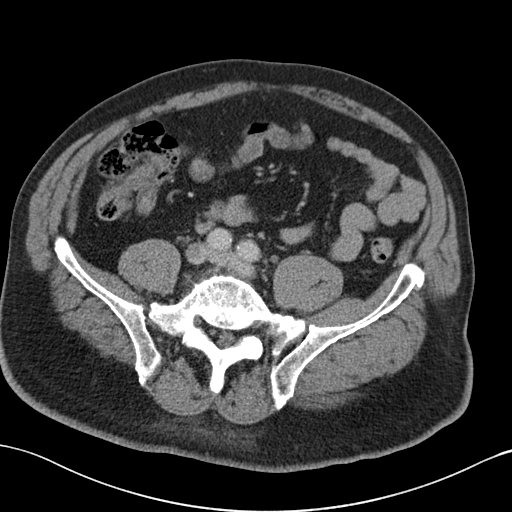
[im 51/96  soft-tissue]
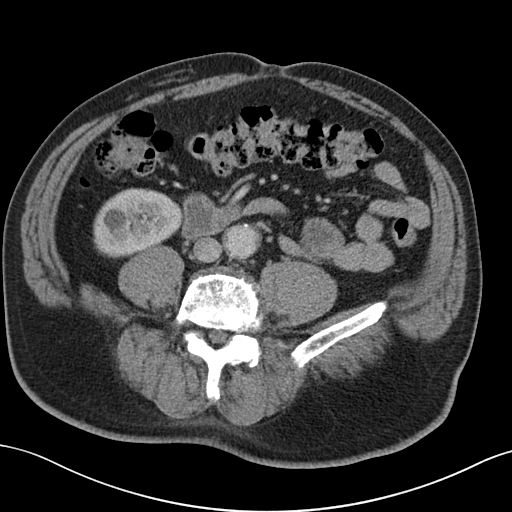
[im 58/96  soft-tissue]
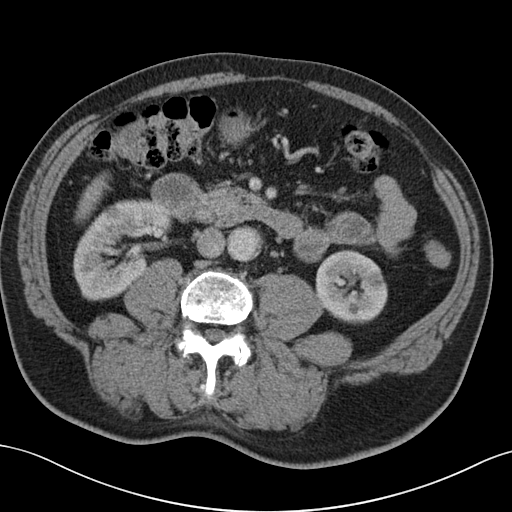
[im 64/96  soft-tissue]
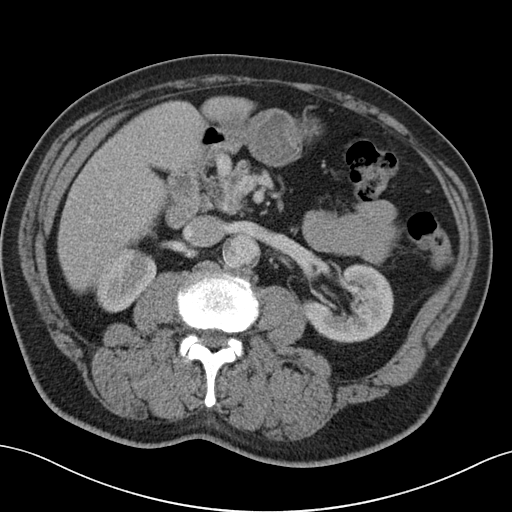
[im 64/96  bone]
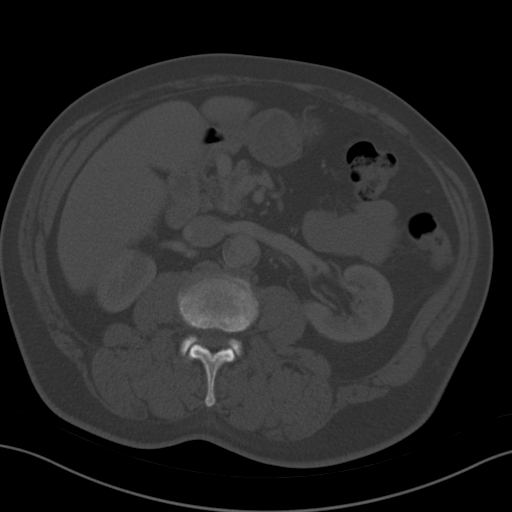
[im 77/96  soft-tissue]
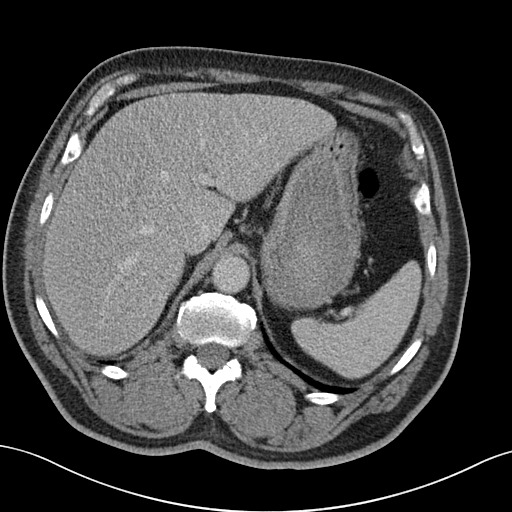
[im 83/96  soft-tissue]
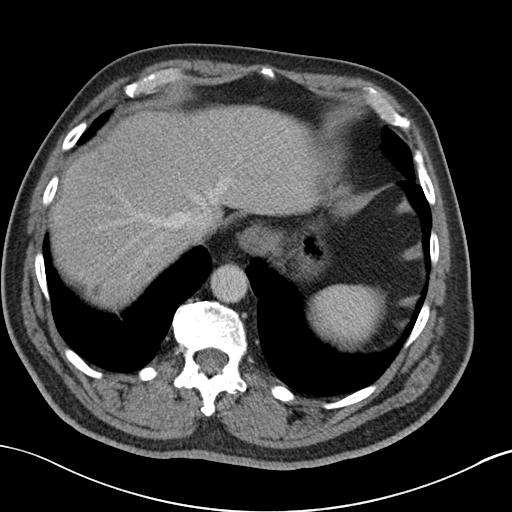
[im 89/96  soft-tissue]
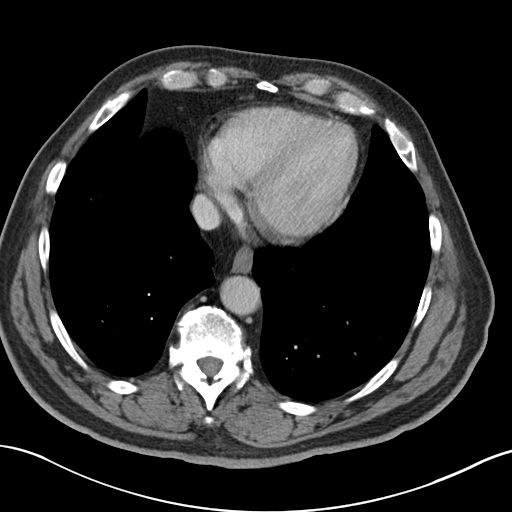

[Series 4: coronal st · coronal · 0.74mm/px · 3 of 101 slices shown]
[im 34/101  soft-tissue]
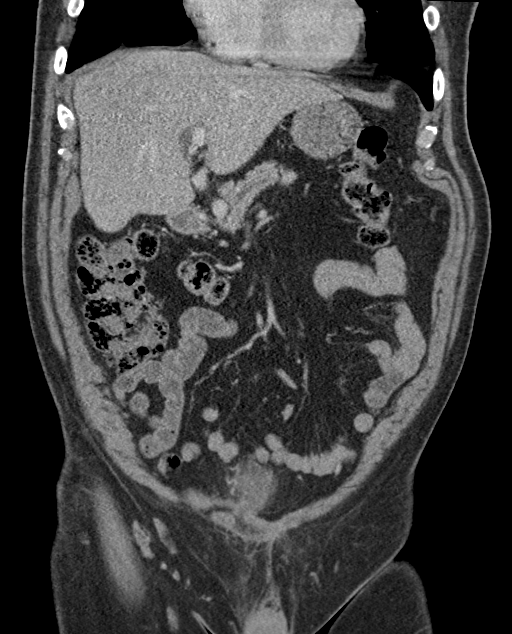
[im 45/101  soft-tissue]
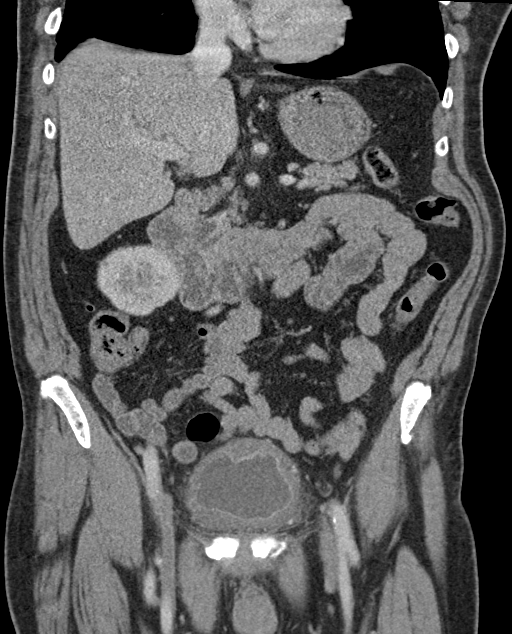
[im 56/101  soft-tissue]
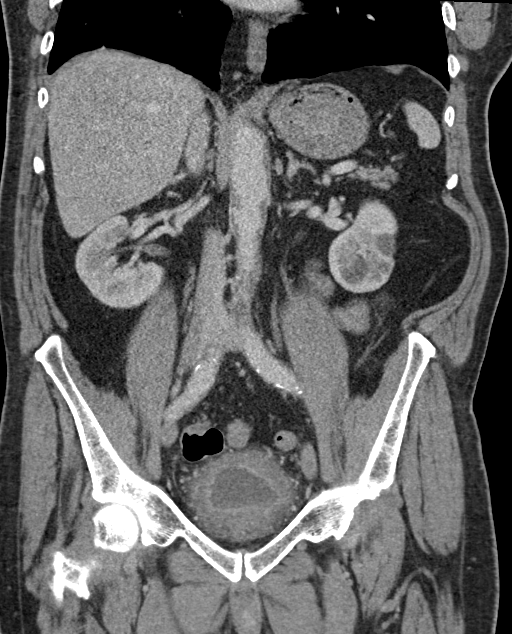

[15 of 46 positions shown; findings below may reference images not displayed]

FINDINGS: Lower chest: Lung bases demonstrate scarring or atelectasis at the
right base. No acute consolidation or pleural effusion. The heart
size is within normal limits.

Hepatobiliary: Subcentimeter hypodense liver lesions, too small to
further characterize. Status post cholecystectomy. No significant
biliary dilatation

Pancreas: Unremarkable. No pancreatic ductal dilatation or
surrounding inflammatory changes.

Spleen: Normal in size without focal abnormality.

Adrenals/Urinary Tract: Adrenal glands are within normal limits.
Kidneys show no hydronephrosis. Bilateral renal cysts. Subcentimeter
renal hypodense lesions too small to further characterize. Possible
small stone mid to lower left kidney. Previously noted catheter in
the bladder is not seen. Diffuse irregular wall thickening of the
bladder with surrounding edema.

Stomach/Bowel: Stomach is nonenlarged. No dilated small bowel.
Negative appendix. Focal wall thickening of the anterior rectum with
low-density intramural area measuring 23 x 11 mm, not significantly
changed since 12/11/2017.

Vascular/Lymphatic: Mild aortic atherosclerosis. Mild aneurysmal
dilatation of the infrarenal abdominal aorta measuring up to 3.5 cm.
No significantly enlarged lymph nodes.

Reproductive: Prostate calcification with post treatment change.

Other: No free air. Similar appearance perirectal and presacral
edema/soft tissue stranding.

Musculoskeletal: No acute osseous abnormality. Degenerative changes.
Few small lucent lesions in the left iliac bone are unchanged.
IMPRESSION: 1. The previously noted urinary catheter is not identified on the
current study. There remains diffuse irregular and slightly
asymmetric wall thickening of the bladder with surrounding edema
consistent with cystitis and or radiation changes.
2. Similar appearance of focal anterior wall thickening of the
rectum since 12/11/2017, question secondary to radiation changes
versus small intramural fluid collection.
3. Possible small stone in the left kidney
4. Small infrarenal abdominal aortic aneurysm up to 3.5 cm.
Recommend followup by ultrasound in 2 years. This recommendation
follows ACR consensus guidelines: White Paper of the ACR [employer]

## 2020-09-05 ENCOUNTER — Telehealth: Payer: Self-pay | Admitting: Urology

## 2020-09-05 NOTE — Telephone Encounter (Signed)
PT called and would like to talk with you about a personal matter. Please call the patient when you can. Thanks

## 2020-09-08 ENCOUNTER — Other Ambulatory Visit: Payer: Self-pay | Admitting: Urology

## 2020-09-08 DIAGNOSIS — N5201 Erectile dysfunction due to arterial insufficiency: Secondary | ICD-10-CM

## 2020-09-08 MED ORDER — ALPROSTADIL 500 MCG/ML IJ SOLN: INTRAMUSCULAR | 5 refills | Status: DC

## 2020-09-08 MED ORDER — ALPROSTADIL (VASODILATOR) 20 MCG IC KIT: 20.0000 ug | PACK | INTRACAVERNOUS | 5 refills | Status: DC | PRN

## 2020-09-15 ENCOUNTER — Ambulatory Visit: Payer: Medicare Other | Admitting: Urology

## 2020-09-15 ENCOUNTER — Other Ambulatory Visit: Payer: Self-pay

## 2020-09-15 ENCOUNTER — Ambulatory Visit (INDEPENDENT_AMBULATORY_CARE_PROVIDER_SITE_OTHER): Payer: Medicare Other

## 2020-09-15 DIAGNOSIS — R350 Frequency of micturition: Secondary | ICD-10-CM

## 2020-09-15 NOTE — Progress Notes (Signed)
PTNS  Session # monthly  Health & Social Factors: diabetic & fluid pills Caffeine: 1 cup Alcohol: non Daytime voids #per day: 4 Night-time voids #per night: 3 Urgency: yes Incontinence Episodes #per day: 2 Ankle used: left Treatment Setting: 2 Feeling/ Response: positive sensation from heel to toe Comments: n/a  Performed By: Durenda Guthrie, Lpn  Assistant: n/a  Follow Up: keep next scheduled visit

## 2020-09-19 DIAGNOSIS — R059 Cough, unspecified: Secondary | ICD-10-CM | POA: Diagnosis not present

## 2020-09-19 DIAGNOSIS — Z20828 Contact with and (suspected) exposure to other viral communicable diseases: Secondary | ICD-10-CM | POA: Diagnosis not present

## 2020-09-21 ENCOUNTER — Telehealth: Payer: Self-pay

## 2020-09-21 NOTE — Telephone Encounter (Signed)
Patient cancelling his appointment. Has COVID.  [facility]  him to use their urologist.  Asking for [facility] to give him a call back to discuss.  Thanks, Helene Kelp

## 2020-09-21 NOTE — Telephone Encounter (Signed)
Spoke with patient and appointment canceled due to Covid. Patient states he will call back to reschedule.

## 2020-09-22 ENCOUNTER — Ambulatory Visit: Payer: Medicare Other | Admitting: Urology

## 2020-09-27 DIAGNOSIS — Z20828 Contact with and (suspected) exposure to other viral communicable diseases: Secondary | ICD-10-CM | POA: Diagnosis not present

## 2020-10-06 ENCOUNTER — Ambulatory Visit (INDEPENDENT_AMBULATORY_CARE_PROVIDER_SITE_OTHER): Payer: Medicare Other

## 2020-10-06 ENCOUNTER — Other Ambulatory Visit: Payer: Self-pay

## 2020-10-06 ENCOUNTER — Ambulatory Visit: Payer: Medicare Other

## 2020-10-06 DIAGNOSIS — R350 Frequency of micturition: Secondary | ICD-10-CM | POA: Diagnosis not present

## 2020-10-06 NOTE — Progress Notes (Signed)
PTNS  Session # monthly  Health & Social Factors: diabetic Caffeine: 1 cup Alcohol: none Daytime voids #per day: 6 Night-time voids #per night: 3 Urgency: yes Incontinence Episodes #per day: left Ankle used: left Treatment Setting: 3 Feeling/ Response: positive sensation from heel to toe Comments: n/a  Performed By: [facility] LPN  Assistant: none  Follow Up: Keep next scheduled NV

## 2020-10-13 DIAGNOSIS — E1143 Type 2 diabetes mellitus with diabetic autonomic (poly)neuropathy: Secondary | ICD-10-CM | POA: Diagnosis not present

## 2020-10-13 DIAGNOSIS — R42 Dizziness and giddiness: Secondary | ICD-10-CM | POA: Diagnosis not present

## 2020-10-13 DIAGNOSIS — Z1389 Encounter for screening for other disorder: Secondary | ICD-10-CM | POA: Diagnosis not present

## 2020-10-13 DIAGNOSIS — Z1331 Encounter for screening for depression: Secondary | ICD-10-CM | POA: Diagnosis not present

## 2020-10-13 DIAGNOSIS — I700 Atherosclerosis of aorta: Secondary | ICD-10-CM | POA: Diagnosis not present

## 2020-10-13 DIAGNOSIS — I10 Essential (primary) hypertension: Secondary | ICD-10-CM | POA: Diagnosis not present

## 2020-10-13 DIAGNOSIS — R2689 Other abnormalities of gait and mobility: Secondary | ICD-10-CM | POA: Diagnosis not present

## 2020-10-13 DIAGNOSIS — R4189 Other symptoms and signs involving cognitive functions and awareness: Secondary | ICD-10-CM | POA: Diagnosis not present

## 2020-10-19 DIAGNOSIS — G319 Degenerative disease of nervous system, unspecified: Secondary | ICD-10-CM | POA: Diagnosis not present

## 2020-10-19 DIAGNOSIS — Z0189 Encounter for other specified special examinations: Secondary | ICD-10-CM | POA: Diagnosis not present

## 2020-10-25 DIAGNOSIS — Z23 Encounter for immunization: Secondary | ICD-10-CM | POA: Diagnosis not present

## 2020-11-02 ENCOUNTER — Telehealth: Payer: Self-pay

## 2020-11-02 NOTE — Telephone Encounter (Signed)
Patient needing a call back because the [place], [address] Phone:  [phone]  Fax:  [phone]    is stating they have not received the fax for the medication.  Please advise.  Call back:  [phone] Jerilynn Mages)   Thanks, Helene Kelp

## 2020-11-02 NOTE — Telephone Encounter (Signed)
Patient called with no answer. Message left to call office. Rx refaxed to number listed.

## 2020-11-03 ENCOUNTER — Ambulatory Visit (INDEPENDENT_AMBULATORY_CARE_PROVIDER_SITE_OTHER): Payer: Medicare Other

## 2020-11-03 ENCOUNTER — Other Ambulatory Visit: Payer: Self-pay

## 2020-11-03 DIAGNOSIS — R350 Frequency of micturition: Secondary | ICD-10-CM

## 2020-11-03 NOTE — Progress Notes (Signed)
Patient noted on to have 2 + pitting edema in left ankle. After several attempts patient was unable to have positive sensation with PTNS. Patient will return to office tomorrow for another attempt.  PTNS  Session # monthly  Health & Social Factors: diabetic Caffeine: 1 cup Alcohol: none Daytime voids #per day: 10 Night-time voids #per night: 6 Urgency: final Incontinence Episodes #per day: 4 Ankle used: left Treatment Setting: n/a Feeling/ Response: Patient noted on to have 2 + pitting edema in left ankle. After several attempts patient was unable to have positive sensation with PTNS. Patient will return to office tomorrow for another attempt. Comments: n/a  Performed By: Durenda Guthrie LPN  Assistant: Estill Bamberg RN  Follow Up: tomorrow

## 2020-11-03 NOTE — Telephone Encounter (Signed)
Spoke with patient in office today.

## 2020-11-04 ENCOUNTER — Ambulatory Visit (INDEPENDENT_AMBULATORY_CARE_PROVIDER_SITE_OTHER): Payer: Medicare Other

## 2020-11-04 DIAGNOSIS — R350 Frequency of micturition: Secondary | ICD-10-CM | POA: Diagnosis not present

## 2020-11-04 NOTE — Progress Notes (Signed)
PTNS   Session # monthly   Health & Social Factors: diabetic Caffeine: 1 cup Alcohol: none Daytime voids #per day: 10 Night-time voids #per night: 6 Urgency: final Incontinence Episodes #per day: 4 Ankle used: left Treatment Setting: 10 Feeling/ Response: Positive sensation from heel to toe Comments: n/a   Performed By: [facility] LPN   Assistant:   Follow Up: Keep next scheduled OV

## 2020-11-21 ENCOUNTER — Telehealth: Payer: Self-pay

## 2020-11-21 NOTE — Telephone Encounter (Signed)
Patient needing a call back.  He says he was supposed to have an appointment before his Sep 29th appt to show him how to use a medication.   Wanting a call back asap.  Call back:  [phone]  Thanks, Helene Kelp

## 2020-11-23 NOTE — Telephone Encounter (Signed)
Patient called with no answer. Message left to return call to office.

## 2020-11-25 DIAGNOSIS — M545 Low back pain, unspecified: Secondary | ICD-10-CM | POA: Diagnosis not present

## 2020-11-25 DIAGNOSIS — R42 Dizziness and giddiness: Secondary | ICD-10-CM | POA: Diagnosis not present

## 2020-11-25 DIAGNOSIS — R531 Weakness: Secondary | ICD-10-CM | POA: Diagnosis not present

## 2020-11-25 DIAGNOSIS — R2689 Other abnormalities of gait and mobility: Secondary | ICD-10-CM | POA: Diagnosis not present

## 2020-11-25 DIAGNOSIS — Z6828 Body mass index (BMI) 28.0-28.9, adult: Secondary | ICD-10-CM | POA: Diagnosis not present

## 2020-11-25 DIAGNOSIS — J701 Chronic and other pulmonary manifestations due to radiation: Secondary | ICD-10-CM | POA: Diagnosis not present

## 2020-12-19 DIAGNOSIS — Z9049 Acquired absence of other specified parts of digestive tract: Secondary | ICD-10-CM | POA: Diagnosis not present

## 2020-12-19 DIAGNOSIS — R059 Cough, unspecified: Secondary | ICD-10-CM | POA: Diagnosis not present

## 2020-12-19 DIAGNOSIS — K2900 Acute gastritis without bleeding: Secondary | ICD-10-CM | POA: Diagnosis not present

## 2020-12-19 DIAGNOSIS — E1122 Type 2 diabetes mellitus with diabetic chronic kidney disease: Secondary | ICD-10-CM | POA: Diagnosis not present

## 2020-12-19 DIAGNOSIS — N189 Chronic kidney disease, unspecified: Secondary | ICD-10-CM | POA: Diagnosis not present

## 2020-12-19 DIAGNOSIS — R0602 Shortness of breath: Secondary | ICD-10-CM | POA: Diagnosis not present

## 2020-12-19 DIAGNOSIS — R0789 Other chest pain: Secondary | ICD-10-CM | POA: Diagnosis not present

## 2020-12-19 DIAGNOSIS — I491 Atrial premature depolarization: Secondary | ICD-10-CM | POA: Diagnosis not present

## 2020-12-19 DIAGNOSIS — K2970 Gastritis, unspecified, without bleeding: Secondary | ICD-10-CM | POA: Diagnosis not present

## 2020-12-19 DIAGNOSIS — Z884 Allergy status to anesthetic agent status: Secondary | ICD-10-CM | POA: Diagnosis not present

## 2020-12-19 DIAGNOSIS — J45909 Unspecified asthma, uncomplicated: Secondary | ICD-10-CM | POA: Diagnosis not present

## 2020-12-19 DIAGNOSIS — Z7982 Long term (current) use of aspirin: Secondary | ICD-10-CM | POA: Diagnosis not present

## 2020-12-19 DIAGNOSIS — R079 Chest pain, unspecified: Secondary | ICD-10-CM | POA: Diagnosis not present

## 2020-12-19 DIAGNOSIS — Z880 Allergy status to penicillin: Secondary | ICD-10-CM | POA: Diagnosis not present

## 2020-12-22 ENCOUNTER — Telehealth: Payer: Self-pay

## 2020-12-22 ENCOUNTER — Encounter: Payer: Self-pay | Admitting: Urology

## 2020-12-22 ENCOUNTER — Ambulatory Visit (INDEPENDENT_AMBULATORY_CARE_PROVIDER_SITE_OTHER): Payer: Medicare Other | Admitting: Urology

## 2020-12-22 ENCOUNTER — Other Ambulatory Visit: Payer: Self-pay

## 2020-12-22 VITALS — BP 131/84 | HR 71 | Temp 98.2°F | Wt 260.6 lb

## 2020-12-22 DIAGNOSIS — R350 Frequency of micturition: Secondary | ICD-10-CM

## 2020-12-22 DIAGNOSIS — N3941 Urge incontinence: Secondary | ICD-10-CM | POA: Diagnosis not present

## 2020-12-22 DIAGNOSIS — Z6828 Body mass index (BMI) 28.0-28.9, adult: Secondary | ICD-10-CM | POA: Diagnosis not present

## 2020-12-22 DIAGNOSIS — N5201 Erectile dysfunction due to arterial insufficiency: Secondary | ICD-10-CM

## 2020-12-22 DIAGNOSIS — R531 Weakness: Secondary | ICD-10-CM | POA: Diagnosis not present

## 2020-12-22 DIAGNOSIS — Z8546 Personal history of malignant neoplasm of prostate: Secondary | ICD-10-CM | POA: Diagnosis not present

## 2020-12-22 DIAGNOSIS — N393 Stress incontinence (female) (male): Secondary | ICD-10-CM

## 2020-12-22 DIAGNOSIS — R2689 Other abnormalities of gait and mobility: Secondary | ICD-10-CM | POA: Diagnosis not present

## 2020-12-22 DIAGNOSIS — J701 Chronic and other pulmonary manifestations due to radiation: Secondary | ICD-10-CM | POA: Diagnosis not present

## 2020-12-22 DIAGNOSIS — Z0001 Encounter for general adult medical examination with abnormal findings: Secondary | ICD-10-CM | POA: Diagnosis not present

## 2020-12-22 DIAGNOSIS — R42 Dizziness and giddiness: Secondary | ICD-10-CM | POA: Diagnosis not present

## 2020-12-22 DIAGNOSIS — M545 Low back pain, unspecified: Secondary | ICD-10-CM | POA: Diagnosis not present

## 2020-12-22 LAB — URINALYSIS, ROUTINE W REFLEX MICROSCOPIC
Bilirubin, UA: NEGATIVE
Glucose, UA: NEGATIVE
Ketones, UA: NEGATIVE
Leukocytes,UA: NEGATIVE
Nitrite, UA: NEGATIVE
Protein,UA: NEGATIVE
RBC, UA: NEGATIVE
Specific Gravity, UA: 1.025 (ref 1.005–1.030)
Urobilinogen, Ur: 0.2 mg/dL (ref 0.2–1.0)
pH, UA: 5.5 (ref 5.0–7.5)

## 2020-12-22 MED ORDER — GEMTESA 75 MG PO TABS: 1.0000 | ORAL_TABLET | Freq: Every day | ORAL | 11 refills | Status: DC

## 2020-12-22 NOTE — Addendum Note (Signed)
Addended byIris Pert on: 12/22/2020 02:48 PM   Modules accepted: Orders

## 2020-12-22 NOTE — Progress Notes (Signed)
  Session # monthly #19 of 25   Health & Social Factors: diabetic Caffeine: 1 cup Alcohol: none Daytime voids #per day: 10 Night-time voids #per night: 6 Urgency: final Incontinence Episodes #per day: 4 Ankle used: left Treatment Setting: 12 Feeling/ Response: Positive sensation from heel to toe Comments: n/a   Performed By: [facility] LPN   Assistant:   Follow Up: Keep next scheduled NV

## 2020-12-22 NOTE — Telephone Encounter (Signed)
Pt wanting to know about [facility] [place] [facility] that was supposed to be sent into:  [place], [place] [address] Phone:  [phone]  Fax:  [phone]    Also wants to know about his urine results.  Will need to call antibiotic into:  [place], [place] Phone:  [phone]  Fax:  [phone]    Please advise.  Thanks, Helene Kelp

## 2020-12-22 NOTE — Telephone Encounter (Signed)
Gemtesa rx sent to [facility]

## 2020-12-22 NOTE — Progress Notes (Signed)
Urological Symptom Review  Patient is experiencing the following symptoms: Burning/pain with urination Get up at night to urinate Leakage of urine Erection problems (male only)   Review of Systems  Gastrointestinal (upper)  : Negative for upper GI symptoms  Gastrointestinal (lower) : Negative for lower GI symptoms  Constitutional : Negative for symptoms  Skin: Negative for skin symptoms  Eyes: Negative for eye symptoms  Ear/Nose/Throat : Negative for Ear/Nose/Throat symptoms  Hematologic/Lymphatic: Negative for Hematologic/Lymphatic symptoms  Cardiovascular : Negative for cardiovascular symptoms  Respiratory : Negative for respiratory symptoms  Endocrine: Negative for endocrine symptoms  Musculoskeletal: Negative for musculoskeletal symptoms  Neurological: Negative for neurological symptoms  Psychologic: Depression Anxiety

## 2020-12-22 NOTE — Progress Notes (Signed)
Subjective:  1. Erectile dysfunction due to arterial insufficiency   2. History of prostate cancer   3. Urge incontinence   4. Stress incontinence, male      Sidhant returns today in f/u.  His PSA was <0.1 on 07/28/20.   He has OAB wet and is has been on PTNS maintenance and Gemtesa samples which worked well but he is out.  He has tadalafil and viagra that he uses prn.  He was given prostin and was to be instructed today but he started using it at home and has had success.   His IPSS is 22.      He has a history of T3b N0 M0 Gleason 9 prostate cancer that was treated with EXRT and ADT.   He completed EXRT in 9/19.   His final  Lupron injection was given 1/21.  His PSA remains <0.1 on 07/28/20 and  his testosterone has gone up to 286 from 221.        ROS:  ROS:  A complete review of systems was performed.  All systems are negative except for pertinent findings as noted.   ROS  Allergies  Allergen Reactions   Penicillins Hives   Ampicillin    Ciprofloxacin Other (See Comments)    Doesn't work   Levaquin [Levofloxacin] Other (See Comments)    Doesn't work    Outpatient Encounter Medications as of 12/22/2020  Medication Sig   alprostadil (EDEX) 20 MCG injection 20 mcg by Intracavitary route as needed for erectile dysfunction. use no more than 3 times per week   alprostadil (PROSTIN VR) 500 MCG/ML injection Alprostadil to 76mcg/ml, inject 9ml intracavernosal prn no more than once daily, 2-3x weekly.  Disp 5 - prefilled 1 ml syringe or a 45ml vial with a box of insulin syringes.   atenolol-chlorthalidone (TENORETIC) 50-25 MG tablet Take 1 tablet by mouth at bedtime.    capsaicin (ZOSTRIX) 0.025 % cream Apply 1 application topically 2 (two) times daily.   ciclopirox (PENLAC) 8 % solution Apply topically at bedtime. Apply over nail and surrounding skin. Apply daily over previous coat. After seven (7) days, may remove with alcohol and continue cycle.   diclofenac Sodium (VOLTAREN) 1 % GEL  Apply 2 g topically 4 (four) times daily as needed (pain).   escitalopram (LEXAPRO) 5 MG tablet Take 5 mg by mouth daily.   FREESTYLE LITE test strip    gabapentin (NEURONTIN) 300 MG capsule Take 1 capsule (300 mg total) by mouth 2 (two) times daily.   hydroquinone 4 % cream Apply topically 2 (two) times daily.   ibuprofen (ADVIL) 600 MG tablet    Incontinence Supply Disposable (FITTED BRIEFS MAXIMUM XL) MISC Use briefs as needed daily for incontinence   Lancets (FREESTYLE) lancets    LANTUS SOLOSTAR 100 UNIT/ML Solostar Pen Inject 2.5 Units into the skin daily.   LINZESS 72 MCG capsule    metoCLOPramide (REGLAN) 5 MG tablet Take 5 mg by mouth in the morning, at noon, and at bedtime.   mupirocin cream (BACTROBAN) 2 % Apply 1 application topically 2 (two) times daily as needed (wound care).   mupirocin ointment (BACTROBAN) 2 % Apply topically.   nystatin ointment (MYCOSTATIN)    nystatin-triamcinolone ointment (MYCOLOG) Apply 1 application topically 2 (two) times daily. To groins   Oxycodone HCl 20 MG TABS Take 5 mg by mouth in the morning, at noon, in the evening, and at bedtime.    potassium chloride (KLOR-CON) 10 MEQ tablet Take 10 mEq  by mouth daily as needed.   predniSONE (DELTASONE) 20 MG tablet Take 20 mg by mouth at bedtime.   psyllium (METAMUCIL SMOOTH TEXTURE) 58.6 % powder Take 1 packet by mouth daily. (Patient taking differently: Take 1 packet by mouth every other day.)   SPIRIVA HANDIHALER 18 MCG inhalation capsule SMARTSIG:Capsule(s) Via Inhaler   tadalafil (CIALIS) 5 MG tablet Take 1 tablet (5 mg total) by mouth daily as needed for erectile dysfunction.   terbinafine (LAMISIL) 250 MG tablet Take 250 mg by mouth daily.   timolol (TIMOPTIC) 0.5 % ophthalmic solution Place 1 drop into both eyes 2 (two) times daily as needed (eye pressure).   VIAGRA 100 MG tablet Take 1 tablet (100 mg total) by mouth daily as needed for erectile dysfunction.   Vibegron (GEMTESA) 75 MG TABS Take 1  tablet by mouth daily.   No facility-administered encounter medications on file as of 12/22/2020.    Past Medical History:  Diagnosis Date   Acquired bladder diverticulum    12/ 2012  resection diverticulum done at [facility] in [place], [place]   Age-related cataract of right eye    scheduled for catarat extraction 09-12-2017   Agent orange exposure    Aneurysm of infrarenal abdominal aorta ([place])    first dx 2017--- last CT 06-11-2017  measures 3.5cm   Chronic constipation    COPD with emphysema ([place])    06--12-2017  per pt no symptoms since Feb 2019   Depression    PTSD   Diabetes mellitus type 2, diet-controlled ([place])    Diabetic neuropathy ([place])    Dyslipidemia    Dyspnea on exertion    Erectile dysfunction    Feeling of incomplete bladder emptying    Gait abnormality 01/06/2020   GERD (gastroesophageal reflux disease)    Gout    09-02-2017 last flare up 3 months ago   Hiatal hernia    History of atrial fibrillation    episode post op right lung lobectomy 07-04-2015   History of bladder stone    History of colon polyps    History of DVT of lower extremity    03/ 2019  bilateral lower extremity (superficial) ---  per treated w/ oral medication    History of gastritis    History of kidney stones    History of radiation therapy 09-14-2015  to 09-21-2015   left upper lung nodule -- 54Gy in 3 fractions (18 Gy per fraction)   Hyperplasia of prostate with lower urinary tract symptoms (LUTS)    Hypertension    Lumbar spinal stenosis    Nephrolithiasis    Neurogenic bladder    Osteoarthritis    ankle, hands   Osteoporosis    Prostate cancer [facility]) urologist-  dr Brooklyn Jeff/  oncologist-  dr Tammi Klippel   dx 05-24-2017-- Stage T2b,  Gleason 4+4,  PSA 22.41,  vol 38cc--- started ADT 04/ 2019,  plan external radiation therapy   Radiation fibrosis of lung ([place])    hx left upper long nodule SBRT 06/ 2017   Squamous cell carcinoma of both lungs ([place]) last CT in epic dated 06-26-2017 no recurrence    dx 03/ 2017  non-small cell SCC via bronchoscopy w/ bx's by dr Roxan Hockey---  s/p  right VATS w/ right lobectomy and node dissection's 07-04-2015 /  pt had SBRT to left upper nodule Stage 1 ([facility]) completed 09-21-2015   Wears dentures    bottom   Wears glasses     Past Surgical History:  Procedure  Laterality Date   BIOPSY  05/29/2019   Procedure: BIOPSY;  Surgeon: Rogene Houston, MD;  [facility];  Service: Endoscopy;;  gastric ulcer   CARPAL TUNNEL RELEASE Right 07/09/2014   Procedure: RIGHT OPEN CARPAL TUNNEL RELEASE;  Surgeon: Mcarthur Rossetti, MD;  [facility]: [facility];  Service: Orthopedics;  Laterality: Right;   CHOLECYSTECTOMY N/A 02/24/2014   Procedure: LAPAROSCOPIC CHOLECYSTECTOMY;  Surgeon: Coralie Keens, MD;  Location: [place];  Service: General;  Laterality: N/A;   COLONOSCOPY     CYSTOLITHOTOMY  11/ 2007      [facility] [place] [facility]2018   Procedure: CYSTOSCOPY FLEXIBLE;  Surgeon: Irine Seal, MD;  Location: [facility];  Service: Urology;  Laterality: N/A;   CYSTOSCOPY W/ LITHOLAPAXY / EHL  02-24-2007   dr Janice Norrie  [facility]   dental implant     No teeth at this time waiting for them to be made as of 01-30-19   ESOPHAGOGASTRODUODENOSCOPY (EGD) WITH PROPOFOL N/A 05/29/2019   Procedure: ESOPHAGOGASTRODUODENOSCOPY (EGD) WITH PROPOFOL;  Surgeon: Rogene Houston, MD;  [facility];  Service: Endoscopy;  Laterality: N/A;  [place] IMPLANT N/A 09/05/2017   Procedure: GOLD SEED IMPLANT;  Surgeon: Irine Seal, MD;  Location: [facility];  Service: Urology;  Laterality: N/A;   INSERTION OF SUPRAPUBIC CATHETER N/A 04/18/2018   Procedure: SUPRAPUBIC TUBE CHANGE;  Surgeon: Irine Seal, MD;  Location[facility];  Service: Urology;  Laterality: N/A;   IR CATHETER TUBE CHANGE  02/24/2018   SPACE OAR INSTILLATION N/A 09/05/2017   Procedure: SPACE OAR INSTILLATION;  Surgeon: Irine Seal, MD;  Location: [facility];  Service: Urology;  Laterality: N/A;   TOTAL ANKLE ARTHROPLASTY Right 04/20/2015   Procedure: TOTAL ANKLE ARTHOPLASTY;  Surgeon: Newt Minion, MD;  Location: [place];  Service: Orthopedics;  Laterality: Right;   TRANSTHORACIC ECHOCARDIOGRAM  11/16/2012   ef 55-60%,  grade 1 diastolic dysfunction/  mild dilated ascending aorta/  mild MR/ trivial TR   TRANSURETHRAL RESECTION OF PROSTATE N/A 02/03/2019   Procedure: TRANSURETHRAL RESECTION OF THE PROSTATE (TURP);  Surgeon: Irine Seal, MD;  [facility];  Service: Urology;  Laterality: N/A;   VIDEO ASSISTED THORACOSCOPY (VATS)/ LOBECTOMY Right 07/04/2015   Procedure: VIDEO ASSISTED THORACOSCOPY (VATS)/ RIGHT UPPER LOBECTOMY;  Surgeon: Melrose Nakayama, MD;  Location: [place];  Service: Thoracic;  Laterality: Right;   VIDEO BRONCHOSCOPY N/A 06/17/2015   Procedure: VIDEO BRONCHOSCOPY;  Surgeon: Melrose Nakayama, MD;  Location: [place];  Service: Thoracic;  Laterality: N/A;   VIDEO BRONCHOSCOPY WITH ENDOBRONCHIAL NAVIGATION N/A 06/17/2015   Procedure: VIDEO BRONCHOSCOPY WITH ENDOBRONCHIAL NAVIGATION;  Surgeon: Melrose Nakayama, MD;  Location: [place];  Service: Thoracic;  Laterality: N/A;   VIDEO BRONCHOSCOPY WITH ENDOBRONCHIAL ULTRASOUND N/A 06/17/2015   Procedure: VIDEO BRONCHOSCOPY WITH ENDOBRONCHIAL ULTRASOUND;  Surgeon: Melrose Nakayama, MD;  [facility];  Service: Thoracic;  Laterality: N/A;    Social History   Socioeconomic History   Marital status: Married    Spouse name: Jerl Santos   Number of children: 2   Years of education: college   Highest education level: Not on file  Occupational History   Occupation: retired  Tobacco Use   Smoking status: Former    Packs/day: 0.50    Years: 40.00    Pack years: 20.00    Types: Cigarettes    Quit date: 02/13/2015    Years  since quitting: 5.8   Smokeless tobacco: Never  Vaping Use   Vaping Use: Never used  Substance and Sexual Activity   Alcohol use: No     Alcohol/week: 0.0 standard drinks   Drug use: No   Sexual activity: Not Currently  Other Topics Concern   Not on file  Social History Narrative   Lives with wife, daughter and son in law temporarily to help them out   Right Handed   Drinks caffeine occassionally   Social Determinants of Health   Financial Resource Strain: Not on file  Food Insecurity: Not on file  Transportation Needs: Not on file  Physical Activity: Not on file  Stress: Not on file  Social Connections: Not on file  Intimate Partner Violence: Not on file    Family History  Problem Relation Age of Onset   Cancer Father        Asbestos   Colon cancer Neg Hx    Colon polyps Neg Hx    Kidney disease Neg Hx    Esophageal cancer Neg Hx    Gallbladder disease Neg Hx    Heart disease Neg Hx    Diabetes Neg Hx        Objective: Vitals:   12/22/20 0937  BP: 131/84  Pulse: 71  Temp: 98.2 F (36.8 C)     Physical Exam  Lab Results:  No results found for this or any previous visit (from the past 24 hour(s)).   BMET No results for input(s): NA, K, CL, CO2, GLUCOSE, BUN, CREATININE, CALCIUM in the last 72 hours. PSA Lab Results  Component Value Date   PSA1 <0.1 07/28/2020   PSA1 <0.1 03/24/2020    PSA  Date Value Ref Range Status  08/21/2019 <0.1 < OR = 4.0 ng/mL Final    Comment:    The total PSA value from this assay system is  standardized against the WHO standard. The test  result will be approximately 20% lower when compared  to the equimolar-standardized total PSA (Beckman  Coulter). Comparison of serial PSA results should be  interpreted with this fact in mind. . This test was performed using the Siemens  chemiluminescent method. Values obtained from  different assay methods cannot be used interchangeably. PSA levels, regardless of value, should not be interpreted as absolute evidence of the presence or absence of disease.   04/06/2019 <0.1 < OR = 4.0 ng/mL Final    Comment:     The total PSA value from this assay system is  standardized against the WHO standard. The test  result will be approximately 20% lower when compared  to the equimolar-standardized total PSA (Beckman  Coulter). Comparison of serial PSA results should be  interpreted with this fact in mind. . This test was performed using the Siemens  chemiluminescent method. Values obtained from  different assay methods cannot be used interchangeably. PSA levels, regardless of value, should not be interpreted as absolute evidence of the presence or absence of disease.   10/26/2008 0.69 0.10 - 4.00 ng/mL Final    Comment:    See lab report for associated comment(s)   Testosterone  Date Value Ref Range Status  07/28/2020 286 264 - 916 ng/dL Final    Comment:    Adult male reference interval is based on a population of healthy nonobese males (BMI <30) between 87 and 29 years old. [place], [place] [phone]. PMID: [id number].   03/24/2020 221 (L) 264 - 916 ng/dL Final    Comment:  Adult male reference interval is based on a population of healthy nonobese males (BMI <30) between 26 and 40 years old. [place], [place] [phone]. PMID: [id number].   08/21/2019 15 (L) 250 - 827 ng/dL Final    Comment:    In hypogonadal males, Testosterone, Total, LC/MS/MS, is the recommended assay due to the diminished accuracy of immunoassay at levels below 250 ng/dL. This test code [phone]) must be collected in a red-top tube with no gel.        Studies/Results:   Assessment & Plan: Prostate cancer.   His last PSA remains undetectible with testosterone recovery.   I will get labs today and have him return in 6 months with labs.   OAB with incontinence with an elevated PVR.  He did better with the PTNS and Gemtesa.  Additional samples given.  He would like to get a script sent to the [place] for depends.   Recurrent UTI's.  He didn't get a specimen today but is not symptomatic.   ED.   He has success with the prostin and will continue that as well as the oral meds as needed.    No orders of the defined types were placed in this encounter.    Orders Placed This Encounter  Procedures   PSA   Testosterone   PSA    Standing Status:   Future    Standing Expiration Date:   12/22/2021   Testosterone    Standing Status:   Future    Standing Expiration Date:   12/22/2021       Return in about 6 months (around 06/21/2021) for with labs. .   CCLanelle Bal, PA-C      Irine Seal 12/22/2020 Patient ID: [name], male   DOB: 1940-10-10, 80 y.o.   MRN: [id number] Patient ID: COSTANTINO KOHLBECK, male   DOB: [birth year], 80 y.o.   MRN: [id number]

## 2020-12-27 DIAGNOSIS — Z6830 Body mass index (BMI) 30.0-30.9, adult: Secondary | ICD-10-CM | POA: Diagnosis not present

## 2020-12-27 DIAGNOSIS — I10 Essential (primary) hypertension: Secondary | ICD-10-CM | POA: Diagnosis not present

## 2020-12-27 DIAGNOSIS — M5126 Other intervertebral disc displacement, lumbar region: Secondary | ICD-10-CM | POA: Diagnosis not present

## 2020-12-27 DIAGNOSIS — M48061 Spinal stenosis, lumbar region without neurogenic claudication: Secondary | ICD-10-CM | POA: Diagnosis not present

## 2020-12-29 DIAGNOSIS — M79676 Pain in unspecified toe(s): Secondary | ICD-10-CM | POA: Diagnosis not present

## 2020-12-29 DIAGNOSIS — L84 Corns and callosities: Secondary | ICD-10-CM | POA: Diagnosis not present

## 2020-12-29 DIAGNOSIS — B351 Tinea unguium: Secondary | ICD-10-CM | POA: Diagnosis not present

## 2020-12-29 DIAGNOSIS — E1142 Type 2 diabetes mellitus with diabetic polyneuropathy: Secondary | ICD-10-CM | POA: Diagnosis not present

## 2021-01-06 ENCOUNTER — Encounter: Payer: Self-pay | Admitting: Neurology

## 2021-01-06 ENCOUNTER — Ambulatory Visit (INDEPENDENT_AMBULATORY_CARE_PROVIDER_SITE_OTHER): Payer: Medicare Other | Admitting: Neurology

## 2021-01-06 VITALS — BP 144/76 | HR 90 | Ht >= 80 in | Wt 253.0 lb

## 2021-01-06 DIAGNOSIS — E1142 Type 2 diabetes mellitus with diabetic polyneuropathy: Secondary | ICD-10-CM

## 2021-01-06 DIAGNOSIS — E538 Deficiency of other specified B group vitamins: Secondary | ICD-10-CM | POA: Diagnosis not present

## 2021-01-06 DIAGNOSIS — R413 Other amnesia: Secondary | ICD-10-CM

## 2021-01-06 NOTE — Progress Notes (Addendum)
Reason for visit: Diabetic peripheral neuropathy, memory disorder  Referring physician: Dr. Lonn Georgia Jonathan [name] is a 80 y.o. male  History of present illness:  Jonathan [name] is a 80 year old right-handed black male with a history of diabetes with a severe diabetic peripheral neuropathy.  The patient also has had some problems with low back pain and neck discomfort, he has been followed through Dr. Christella Noa.  He indicates that he will be having an MRI of the lumbar spine in the near future.  He was seen through this office in October 2021 and nerve conduction studies documented a severe peripheral neuropathy and a significant right ulnar neuropathy.  The patient is on some gabapentin for discomfort in the feet at night taking 800 mg.  He uses a walker for ambulation.  He is sent to this office for reports of dizziness.  Upon questioning, it appears that the "dizziness" is actually a sensation of imbalance when he is standing.  If he reaches over and touches his walker or touches a wall or furniture, the dizziness disappears.  The patient denies any recent falls.  He is interested in getting hand controls for his car so he can operate a motor vehicle.  He does report a 2-year history of some memory changes, he currently is on Aricept.  The patient says he has difficulty with short-term memory, he may misplace things about the house.  He denies any significant issues with directions with driving but he requires assistance with keeping up with appointments, medications, and he needs help with bathing and dressing.  He comes to the office today for further evaluation.  He has had a recent MRI of the brain that shows mild small vessel disease.  Past Medical History:  Diagnosis Date   Acquired bladder diverticulum    12/ 2012  resection diverticulum done at [facility] in [place], [place]   Age-related cataract of right eye    scheduled for catarat extraction 09-12-2017   Agent orange exposure    Aneurysm  of infrarenal abdominal aorta    first dx 2017--- last CT 06-11-2017  measures 3.5cm   Chronic constipation    COPD with emphysema ([place])    06--12-2017  per pt no symptoms since Feb 2019   Depression    PTSD   Diabetes mellitus type 2, diet-controlled ([place])    Diabetic neuropathy ([place])    Dyslipidemia    Dyspnea on exertion    Erectile dysfunction    Feeling of incomplete bladder emptying    Gait abnormality 01/06/2020   GERD (gastroesophageal reflux disease)    Gout    09-02-2017 last flare up 3 months ago   Hiatal hernia    History of atrial fibrillation    episode post op right lung lobectomy 07-04-2015   History of bladder stone    History of colon polyps    History of DVT of lower extremity    03/ 2019  bilateral lower extremity (superficial) ---  per treated w/ oral medication    History of gastritis    History of kidney stones    History of radiation therapy 09-14-2015  to 09-21-2015   left upper lung nodule -- 54Gy in 3 fractions (18 Gy per fraction)   Hyperplasia of prostate with lower urinary tract symptoms (LUTS)    Hypertension    Lumbar spinal stenosis    Nephrolithiasis    Neurogenic bladder    Osteoarthritis    ankle, hands   Osteoporosis  Prostate cancer [facility]) urologist-  dr wrenn/  oncologist-  dr Tammi Klippel   dx 05-24-2017-- Stage T2b,  Gleason 4+4,  PSA 22.41,  vol 38cc--- started ADT 04/ 2019,  plan external radiation therapy   Radiation fibrosis of lung ([place])    hx left upper long nodule SBRT 06/ 2017   Squamous cell carcinoma of both lungs ([place]) last CT in epic dated 06-26-2017 no recurrence   dx 03/ 2017  non-small cell SCC via bronchoscopy w/ bx's by dr Roxan Hockey---  s/p  right VATS w/ right lobectomy and node dissection's 07-04-2015 /  pt had SBRT to left upper nodule Stage 1 ([facility]) completed 09-21-2015   Wears dentures    bottom   Wears glasses     Past Surgical History:  Procedure Laterality Date   BIOPSY  05/29/2019    Procedure: BIOPSY;  Surgeon: Rogene Houston, MD;  [facility];  Service: Endoscopy;;  gastric ulcer   CARPAL TUNNEL RELEASE Right 07/09/2014   Procedure: RIGHT OPEN CARPAL TUNNEL RELEASE;  Surgeon: Mcarthur Rossetti, MD;  [facility];  Service: Orthopedics;  Laterality: Right;   CHOLECYSTECTOMY N/A 02/24/2014   Procedure: LAPAROSCOPIC CHOLECYSTECTOMY;  Surgeon: Coralie Keens, MD;  Location: [facility];  Service: General;  Laterality: N/A;   COLONOSCOPY     CYSTOLITHOTOMY  11/ 2007      [facility] [place] [facility]/18/2018   Procedure: CYSTOSCOPY FLEXIBLE;  Surgeon: Irine Seal, MD;  [facility];  Service: Urology;  Laterality: N/A;   CYSTOSCOPY W/ LITHOLAPAXY / EHL  02-24-2007   dr Janice Norrie  [facility]   dental implant     No teeth at this time waiting for them to be made as of 01-30-19   ESOPHAGOGASTRODUODENOSCOPY (EGD) WITH PROPOFOL N/A 05/29/2019   Procedure: ESOPHAGOGASTRODUODENOSCOPY (EGD) WITH PROPOFOL;  Surgeon: Rogene Houston, MD;  [facility];  Service: Endoscopy;  Laterality: N/A;  [place] IMPLANT N/A 09/05/2017   Procedure: GOLD SEED IMPLANT;  Surgeon: Irine Seal, MD;  Location: [facility];  Service: Urology;  Laterality: N/A;   INSERTION OF SUPRAPUBIC CATHETER N/A 04/18/2018   Procedure: SUPRAPUBIC TUBE CHANGE;  Surgeon: Irine Seal, MD;  [facility];  Service: Urology;  Laterality: N/A;   IR CATHETER TUBE CHANGE  02/24/2018   SPACE OAR INSTILLATION N/A 09/05/2017   Procedure: SPACE OAR INSTILLATION;  Surgeon: Irine Seal, MD;  Location: [facility];  Service: Urology;  Laterality: N/A;   TOTAL ANKLE ARTHROPLASTY Right 04/20/2015   Procedure: TOTAL ANKLE ARTHOPLASTY;  Surgeon: Newt Minion, MD;  Location: [facility];  Service: Orthopedics;  Laterality: Right;   TRANSTHORACIC ECHOCARDIOGRAM  11/16/2012   ef 55-60%,  grade 1 diastolic dysfunction/  mild dilated ascending aorta/  mild MR/ trivial  TR   TRANSURETHRAL RESECTION OF PROSTATE N/A 02/03/2019   Procedure: TRANSURETHRAL RESECTION OF THE PROSTATE (TURP);  Surgeon: Irine Seal, MD;  [facility];  Service: Urology;  Laterality: N/A;   VIDEO ASSISTED THORACOSCOPY (VATS)/ LOBECTOMY Right 07/04/2015   Procedure: VIDEO ASSISTED THORACOSCOPY (VATS)/ RIGHT UPPER LOBECTOMY;  Surgeon: Melrose Nakayama, MD;  Location: [facility];  Service: Thoracic;  Laterality: Right;   VIDEO BRONCHOSCOPY N/A 06/17/2015   Procedure: VIDEO BRONCHOSCOPY;  Surgeon: Melrose Nakayama, MD;  Location: [facility];  Service: Thoracic;  Laterality: N/A;   VIDEO BRONCHOSCOPY WITH ENDOBRONCHIAL NAVIGATION N/A 06/17/2015   Procedure: VIDEO BRONCHOSCOPY WITH ENDOBRONCHIAL  NAVIGATION;  Surgeon: Melrose Nakayama, MD;  Location: [facility];  Service: Thoracic;  Laterality: N/A;   VIDEO BRONCHOSCOPY WITH ENDOBRONCHIAL ULTRASOUND N/A 06/17/2015   Procedure: VIDEO BRONCHOSCOPY WITH ENDOBRONCHIAL ULTRASOUND;  Surgeon: Melrose Nakayama, MD;  Location: [facility];  Service: Thoracic;  Laterality: N/A;    Family History  Problem Relation Age of Onset   Cancer Father        Asbestos   Colon cancer Neg Hx    Colon polyps Neg Hx    Kidney disease Neg Hx    Esophageal cancer Neg Hx    Gallbladder disease Neg Hx    Heart disease Neg Hx    Diabetes Neg Hx     Social history:  reports that he quit smoking about 5 years ago. His smoking use included cigarettes. He has a 20.00 pack-year smoking history. He has never used smokeless tobacco. He reports that he does not drink alcohol and does not use drugs.  Medications:  Prior to Admission medications   Medication Sig Start Date End Date Taking? Authorizing Provider  alprostadil (EDEX) 20 MCG injection 20 mcg by Intracavitary route as needed for erectile dysfunction. use no more than 3 times per week 09/08/20  Yes Irine Seal, MD  alprostadil (PROSTIN VR) 500 MCG/ML injection Alprostadil to 12mcg/ml, inject 74ml intracavernosal prn no  more than once daily, 2-3x weekly.  Disp 5 - prefilled 1 ml syringe or a 79ml vial with a box of insulin syringes. 09/08/20  Yes Irine Seal, MD  atenolol-chlorthalidone (TENORETIC) 50-25 MG tablet Take 1 tablet by mouth at bedtime.  12/03/18  Yes [provider]  capsaicin (ZOSTRIX) 0.025 % cream Apply 1 application topically 2 (two) times daily. 02/09/20  Yes [provider]  ciclopirox (PENLAC) 8 % solution Apply topically at bedtime. Apply over nail and surrounding skin. Apply daily over previous coat. After seven (7) days, may remove with alcohol and continue cycle.   Yes [provider]  diclofenac Sodium (VOLTAREN) 1 % GEL Apply 2 g topically 4 (four) times daily as needed (pain).   Yes [provider]  escitalopram (LEXAPRO) 5 MG tablet Take 5 mg by mouth daily.   Yes [provider]  FREESTYLE LITE test strip  09/02/19  Yes [provider]  gabapentin (NEURONTIN) 300 MG capsule Take 1 capsule (300 mg total) by mouth 2 (two) times daily. 05/26/20  Yes Kathrynn Ducking, MD  hydroquinone 4 % cream Apply topically 2 (two) times daily.   Yes [provider]  ibuprofen (ADVIL) 600 MG tablet  05/26/19  Yes [provider]  Incontinence Supply Disposable (FITTED BRIEFS MAXIMUM XL) MISC Use briefs as needed daily for incontinence 06/05/19  Yes Irine Seal, MD  Lancets (FREESTYLE) lancets  09/02/19  Yes [provider]  LANTUS SOLOSTAR 100 UNIT/ML Solostar Pen Inject 2.5 Units into the skin daily. 11/28/17  Yes [provider]  Rolan Lipa 72 MCG capsule  05/21/19  Yes [provider]  metoCLOPramide (REGLAN) 5 MG tablet Take 5 mg by mouth in the morning, at noon, and at bedtime.   Yes [provider]  mupirocin cream (BACTROBAN) 2 % Apply 1 application topically 2 (two) times daily as needed (wound care).   Yes [provider]  mupirocin ointment (BACTROBAN) 2 % Apply topically. 09/10/19  Yes [provider]  nystatin ointment (MYCOSTATIN)  09/03/19  Yes [provider]  nystatin-triamcinolone ointment (MYCOLOG) Apply 1 application topically 2 (two) times daily.  To groins 09/11/19  Yes Irine Seal, MD  Oxycodone HCl 20 MG TABS Take 5 mg by mouth in the morning, at noon, in the evening, and at bedtime.  02/02/18  Yes [provider]  potassium chloride (KLOR-CON) 10 MEQ tablet Take 10 mEq by mouth daily as needed.   Yes [provider]  predniSONE (DELTASONE) 20 MG tablet Take 20 mg by mouth at bedtime.   Yes [provider]  psyllium (METAMUCIL SMOOTH TEXTURE) 58.6 % powder Take 1 packet by mouth daily. Patient taking differently: Take 1 packet by mouth every other day. 04/27/19  Yes Rehman, Mechele Dawley, MD  SPIRIVA HANDIHALER 18 MCG inhalation capsule SMARTSIG:Capsule(s) Via Inhaler 03/16/20  Yes [provider]  tadalafil (CIALIS) 5 MG tablet Take 1 tablet (5 mg total) by mouth daily as needed for erectile dysfunction. 05/05/20  Yes Irine Seal, MD  terbinafine (LAMISIL) 250 MG tablet Take 250 mg by mouth daily. 09/03/19  Yes [provider]  timolol (TIMOPTIC) 0.5 % ophthalmic solution Place 1 drop into both eyes 2 (two) times daily as needed (eye pressure).   Yes [provider]  VIAGRA 100 MG tablet Take 1 tablet (100 mg total) by mouth daily as needed for erectile dysfunction. 08/18/20  Yes Irine Seal, MD  Vibegron (GEMTESA) 75 MG TABS Take 1 tablet by mouth daily. 12/22/20  Yes Irine Seal, MD      Allergies  Allergen Reactions   Penicillins Hives   Ampicillin    Ciprofloxacin Other (See Comments)    Doesn't work   Levaquin [Levofloxacin] Other (See Comments)    Doesn't work    ROS:  Out of a complete 14 system review of symptoms, the patient complains only of the following symptoms, and all other reviewed systems are negative.  Memory problems Walking difficulty Dizziness  Blood pressure (!) 144/76, pulse 90,  height 6\' 8"  (2.032 m), weight 253 lb (114.8 kg).  Physical Exam  General: The patient is alert and cooperative at the time of the examination.  Eyes: Pupils are equal, round, and reactive to light. Discs are flat bilaterally.  Neck: The neck is supple, no carotid bruits are noted.  Respiratory: The respiratory examination is clear.  Cardiovascular: The cardiovascular examination reveals a regular rate and rhythm, no obvious murmurs or rubs are noted.  Skin: Extremities are without significant edema.  Neurologic Exam  Mental status: The patient is alert and oriented x 3 at the time of the examination. The patient has apparent normal recent and remote memory, with an apparently normal attention span and concentration ability.  Cranial nerves: Facial symmetry is present. There is good sensation of the face to pinprick and soft touch bilaterally. The strength of the facial muscles and the muscles to head turning and shoulder shrug are normal bilaterally. Speech is well enunciated, no aphasia or dysarthria is noted. Extraocular movements are full. Visual fields are full. The tongue is midline, and the patient has symmetric elevation of the soft palate. No obvious hearing deficits are noted.  Motor: The motor testing reveals 5 over 5 strength of all 4 extremities, with exception of weakness of the intrinsic muscles that right hand and mild bilateral foot drop. Good symmetric motor tone is noted throughout.  Sensory: Sensory testing is intact to pinprick, soft touch, vibration sensation, and position sense on the upper extremities, with exception of some decreased position sense in the right hand.  With the lower extremities, there is a stocking pattern pinprick sensory deficit  up to the knees bilaterally with significant impairment of vibration and position sense in both feet.  No evidence of extinction is noted.  Coordination: Cerebellar testing reveals good finger-nose-finger and heel-to-shin  bilaterally.  Gait and station: Gait is wide-based, unsteady, the patient can walk with assistance.  Romberg is negative but is unsteady.  Reflexes: Deep tendon reflexes are symmetric, but are depressed bilaterally. Toes are downgoing bilaterally.   MRI brain 10/19/20:  FINDINGS:  Brain: Diffusion imaging does not show any acute or subacute  infarction. Mild chronic small-vessel ischemic change affects pons.  No focal cerebellar finding. Cerebral hemispheres show moderate  chronic small-vessel ischemic changes of the deep white matter,  progressive over time. No cortical or large vessel territory  infarction. No mass lesion, hemorrhage, hydrocephalus or extra-axial  collection   Vascular: Major vessels at the base of the brain show flow.   Skull and upper cervical spine: Negative. No evidence of skull or  skull base metastatic disease.   Sinuses/Orbits: Sinuses are clear.  Orbits appear normal.     Assessment/Plan:  1.  Severe diabetic peripheral neuropathy  2.  Gait disturbance  3.  Mild memory disturbance  The patient will continue the Aricept, he will have blood work done today.  He will follow-up here in about 6 months.  We will follow the memory issues over time.  The dizziness reported by the patient is actually a sensation of imbalance associated with severe peripheral neuropathy.  In the future, this patient can be followed through Dr. Leta Baptist.  Jonathan Alexanders MD 01/06/2021 10:36 AM  [facility] [address] [place], [place] [address]  Phone [phone] Fax [phone]

## 2021-01-07 LAB — VITAMIN B12: Vitamin B-12: 630 pg/mL (ref 232–1245)

## 2021-01-07 LAB — RPR: RPR Ser Ql: NONREACTIVE

## 2021-01-07 LAB — SEDIMENTATION RATE: Sed Rate: 26 mm/hr (ref 0–30)

## 2021-01-09 ENCOUNTER — Telehealth: Payer: Self-pay | Admitting: Neurology

## 2021-01-09 NOTE — Telephone Encounter (Signed)
Pt is asking for the status of the adaptive equipment for his vehicle, he is wanting to know about the order being sent to his insurance company.

## 2021-01-09 NOTE — Telephone Encounter (Signed)
I called the patient.  The patient was given a prescription for conversion of his motor vehicle for hand controls due to the peripheral neuropathy.  Generally he will need to get the conversion done through a car dealership, he may contact the [employer] to see if they will cover or partially cover the conversion.  This issue is up to the patient, we do not do this through this office.

## 2021-01-11 DIAGNOSIS — M48061 Spinal stenosis, lumbar region without neurogenic claudication: Secondary | ICD-10-CM | POA: Diagnosis not present

## 2021-01-11 DIAGNOSIS — M5126 Other intervertebral disc displacement, lumbar region: Secondary | ICD-10-CM | POA: Diagnosis not present

## 2021-01-16 ENCOUNTER — Other Ambulatory Visit: Payer: Self-pay

## 2021-01-16 ENCOUNTER — Ambulatory Visit (INDEPENDENT_AMBULATORY_CARE_PROVIDER_SITE_OTHER): Payer: Medicare Other

## 2021-01-16 DIAGNOSIS — R350 Frequency of micturition: Secondary | ICD-10-CM | POA: Diagnosis not present

## 2021-01-16 NOTE — Progress Notes (Signed)
PTNS  Session # 20 of 45  Health & Social Factors: Diabetes Caffeine: 1 cup Alcohol: none Daytime voids #per day: 5 Night-time voids #per night: 3 Urgency: yes Incontinence Episodes #per day: 3 Ankle used: left Treatment Setting: 8 Feeling/ Response: heel throb Comments: none  Performed By: Lurline Hare  Follow Up:  1 Month PTNS

## 2021-01-18 ENCOUNTER — Ambulatory Visit: Payer: Non-veteran care

## 2021-01-18 DIAGNOSIS — Z23 Encounter for immunization: Secondary | ICD-10-CM | POA: Diagnosis not present

## 2021-01-20 NOTE — Telephone Encounter (Signed)
I called the patient.  The patient was given a prescription for the cardioversion when he was in the office.  He is to look through the papers that he left the office with.

## 2021-01-20 NOTE — Telephone Encounter (Signed)
Pt states he only needs the prescription .  Pt states the [employer] will cover, he is asking if Dr Jannifer Franklin will write the prescription, please call.

## 2021-01-23 DIAGNOSIS — M48061 Spinal stenosis, lumbar region without neurogenic claudication: Secondary | ICD-10-CM | POA: Diagnosis not present

## 2021-01-26 ENCOUNTER — Other Ambulatory Visit: Payer: Self-pay | Admitting: Neurosurgery

## 2021-01-26 DIAGNOSIS — H401133 Primary open-angle glaucoma, bilateral, severe stage: Secondary | ICD-10-CM | POA: Diagnosis not present

## 2021-01-26 DIAGNOSIS — H26493 Other secondary cataract, bilateral: Secondary | ICD-10-CM | POA: Diagnosis not present

## 2021-01-26 DIAGNOSIS — Z961 Presence of intraocular lens: Secondary | ICD-10-CM | POA: Diagnosis not present

## 2021-01-30 DIAGNOSIS — H401133 Primary open-angle glaucoma, bilateral, severe stage: Secondary | ICD-10-CM | POA: Diagnosis not present

## 2021-01-30 DIAGNOSIS — H26493 Other secondary cataract, bilateral: Secondary | ICD-10-CM | POA: Diagnosis not present

## 2021-01-30 DIAGNOSIS — Z961 Presence of intraocular lens: Secondary | ICD-10-CM | POA: Diagnosis not present

## 2021-02-09 ENCOUNTER — Other Ambulatory Visit: Payer: Self-pay

## 2021-02-09 ENCOUNTER — Encounter: Payer: Self-pay | Admitting: Urology

## 2021-02-09 ENCOUNTER — Ambulatory Visit (INDEPENDENT_AMBULATORY_CARE_PROVIDER_SITE_OTHER): Payer: Medicare Other | Admitting: Urology

## 2021-02-09 VITALS — BP 147/86 | HR 78 | Temp 98.0°F

## 2021-02-09 DIAGNOSIS — N3941 Urge incontinence: Secondary | ICD-10-CM | POA: Diagnosis not present

## 2021-02-09 DIAGNOSIS — R351 Nocturia: Secondary | ICD-10-CM

## 2021-02-09 DIAGNOSIS — Z8546 Personal history of malignant neoplasm of prostate: Secondary | ICD-10-CM

## 2021-02-09 DIAGNOSIS — N5201 Erectile dysfunction due to arterial insufficiency: Secondary | ICD-10-CM

## 2021-02-09 DIAGNOSIS — N390 Urinary tract infection, site not specified: Secondary | ICD-10-CM

## 2021-02-09 DIAGNOSIS — R350 Frequency of micturition: Secondary | ICD-10-CM | POA: Diagnosis not present

## 2021-02-09 NOTE — Progress Notes (Signed)
PTNS  Session # 21 of 45  Health & Social Factors: diabetic Caffeine: none Alcohol: none Daytime voids #per day: ? Night-time voids #per night: ? Urgency: yes Incontinence Episodes #per day: ? Ankle used: left Treatment Setting: 9 Feeling/ Response: positive Comments: n/a  Performed By: [facility] LPN  Follow Up: keep next scheduled [place]

## 2021-02-09 NOTE — Progress Notes (Signed)
Urological Symptom Review  Patient is experiencing the following symptoms: N/a   Review of Systems  Gastrointestinal (upper)  : Negative for upper GI symptoms  Gastrointestinal (lower) : Negative for lower GI symptoms  Constitutional : Negative for symptoms  Skin: Negative for skin symptoms  Eyes: Blurred vision Double vision  Ear/Nose/Throat : Negative for Ear/Nose/Throat symptoms  Hematologic/Lymphatic: Negative for Hematologic/Lymphatic symptoms  Cardiovascular : Negative for cardiovascular symptoms  Respiratory : Negative for respiratory symptoms  Endocrine: Negative for endocrine symptoms  Musculoskeletal: Negative for musculoskeletal symptoms  Neurological: Negative for neurological symptoms  Psychologic: Negative for psychiatric symptoms

## 2021-02-09 NOTE — Progress Notes (Signed)
Subjective:  1. History of prostate cancer   2. Urge incontinence   3. Nocturia   4. Urinary frequency   5. Erectile dysfunction due to arterial insufficiency   6. Recurrent UTI      Jonathan [name] returns today in f/u.  His PSA was <0.1 on 07/28/20.  He didn't get labs prior to the visit.  He has OAB wet and is has been on PTNS maintenance and Gemtesa samples..  He has frequent small voids.  He has nocturia x 3. UA is clear today.   He isn't having accidents. He has some dysuria but no hematuria.   He is getting PTNS today.   He has tadalafil and viagra that he uses prn.  He was given prostin and started using it at home and has had success, but he has been given the med by the [place] and he says the needles are too big.  He does have success with the viagra.         He has a history of T3b N0 M0 Gleason 9 prostate cancer that was treated with EXRT and ADT.   He completed EXRT in 9/19.   His final  Lupron injection was given 1/21.  His PSA was <0.1 on 07/28/20 and  his testosterone had gone up to 286 from 221.        ROS:  ROS:  A complete review of systems was performed.  All systems are negative except for pertinent findings as noted.   ROS  Allergies  Allergen Reactions   Penicillins Hives   Ampicillin    Ciprofloxacin Other (See Comments)    Doesn't work   Levaquin [Levofloxacin] Other (See Comments)    Doesn't work    Outpatient Encounter Medications as of 02/09/2021  Medication Sig   alprostadil (EDEX) 20 MCG injection 20 mcg by Intracavitary route as needed for erectile dysfunction. use no more than 3 times per week   alprostadil (PROSTIN VR) 500 MCG/ML injection Alprostadil to 26mcg/ml, inject 34ml intracavernosal prn no more than once daily, 2-3x weekly.  Disp 5 - prefilled 1 ml syringe or a 9ml vial with a box of insulin syringes.   atenolol-chlorthalidone (TENORETIC) 50-25 MG tablet Take 1 tablet by mouth at bedtime.    capsaicin (ZOSTRIX) 0.025 % cream Apply 1 application  topically 2 (two) times daily.   ciclopirox (PENLAC) 8 % solution Apply topically at bedtime. Apply over nail and surrounding skin. Apply daily over previous coat. After seven (7) days, may remove with alcohol and continue cycle.   diclofenac Sodium (VOLTAREN) 1 % GEL Apply 2 g topically 4 (four) times daily as needed (pain).   escitalopram (LEXAPRO) 5 MG tablet Take 5 mg by mouth daily.   FREESTYLE LITE test strip    gabapentin (NEURONTIN) 300 MG capsule Take 1 capsule (300 mg total) by mouth 2 (two) times daily.   hydroquinone 4 % cream Apply topically 2 (two) times daily.   ibuprofen (ADVIL) 600 MG tablet    Incontinence Supply Disposable (FITTED BRIEFS MAXIMUM XL) MISC Use briefs as needed daily for incontinence   Lancets (FREESTYLE) lancets    LANTUS SOLOSTAR 100 UNIT/ML Solostar Pen Inject 2.5 Units into the skin daily.   LINZESS 72 MCG capsule    metoCLOPramide (REGLAN) 5 MG tablet Take 5 mg by mouth in the morning, at noon, and at bedtime.   mupirocin cream (BACTROBAN) 2 % Apply 1 application topically 2 (two) times daily as needed (wound care).   mupirocin  ointment (BACTROBAN) 2 % Apply topically.   nystatin ointment (MYCOSTATIN)    nystatin-triamcinolone ointment (MYCOLOG) Apply 1 application topically 2 (two) times daily. To groins   Oxycodone HCl 20 MG TABS Take 5 mg by mouth in the morning, at noon, in the evening, and at bedtime.    potassium chloride (KLOR-CON) 10 MEQ tablet Take 10 mEq by mouth daily as needed.   predniSONE (DELTASONE) 20 MG tablet Take 20 mg by mouth at bedtime.   psyllium (METAMUCIL SMOOTH TEXTURE) 58.6 % powder Take 1 packet by mouth daily. (Patient taking differently: Take 1 packet by mouth every other day.)   SPIRIVA HANDIHALER 18 MCG inhalation capsule SMARTSIG:Capsule(s) Via Inhaler   tadalafil (CIALIS) 5 MG tablet Take 1 tablet (5 mg total) by mouth daily as needed for erectile dysfunction.   terbinafine (LAMISIL) 250 MG tablet Take 250 mg by mouth  daily.   timolol (TIMOPTIC) 0.5 % ophthalmic solution Place 1 drop into both eyes 2 (two) times daily as needed (eye pressure).   VIAGRA 100 MG tablet Take 1 tablet (100 mg total) by mouth daily as needed for erectile dysfunction.   Vibegron (GEMTESA) 75 MG TABS Take 1 tablet by mouth daily.   No facility-administered encounter medications on file as of 02/09/2021.    Past Medical History:  Diagnosis Date   Acquired bladder diverticulum    12/ 2012  resection diverticulum done at [facility] in [place], [place]   Age-related cataract of right eye    scheduled for catarat extraction 09-12-2017   Agent orange exposure    Aneurysm of infrarenal abdominal aorta    first dx 2017--- last CT 06-11-2017  measures 3.5cm   Chronic constipation    COPD with emphysema ([place])    06--12-2017  per pt no symptoms since Feb 2019   Depression    PTSD   Diabetes mellitus type 2, diet-controlled ([place])    Diabetic neuropathy ([place])    Dyslipidemia    Dyspnea on exertion    Erectile dysfunction    Feeling of incomplete bladder emptying    Gait abnormality 01/06/2020   GERD (gastroesophageal reflux disease)    Gout    09-02-2017 last flare up 3 months ago   Hiatal hernia    History of atrial fibrillation    episode post op right lung lobectomy 07-04-2015   History of bladder stone    History of colon polyps    History of DVT of lower extremity    03/ 2019  bilateral lower extremity (superficial) ---  per treated w/ oral medication    History of gastritis    History of kidney stones    History of radiation therapy 09-14-2015  to 09-21-2015   left upper lung nodule -- 54Gy in 3 fractions (18 Gy per fraction)   Hyperplasia of prostate with lower urinary tract symptoms (LUTS)    Hypertension    Lumbar spinal stenosis    Nephrolithiasis    Neurogenic bladder    Osteoarthritis    ankle, hands   Osteoporosis    Prostate cancer [facility]) urologist-  dr Ahava Kissoon/  oncologist-  dr Tammi Klippel   dx 05-24-2017-- Stage T2b,   Gleason 4+4,  PSA 22.41,  vol 38cc--- started ADT 04/ 2019,  plan external radiation therapy   Radiation fibrosis of lung ([place])    hx left upper long nodule SBRT 06/ 2017   Squamous cell carcinoma of both lungs ([place]) last CT in epic dated 06-26-2017 no recurrence   dx 03/  2017  non-small cell SCC via bronchoscopy w/ bx's by dr Roxan Hockey---  s/p  right VATS w/ right lobectomy and node dissection's 07-04-2015 /  pt had SBRT to left upper nodule Stage 1 ([facility]) completed 09-21-2015   Wears dentures    bottom   Wears glasses     Past Surgical History:  Procedure Laterality Date   BIOPSY  05/29/2019   Procedure: BIOPSY;  Surgeon: Rogene Houston, MD;  [facility];  Service: Endoscopy;;  gastric ulcer   CARPAL TUNNEL RELEASE Right 07/09/2014   Procedure: RIGHT OPEN CARPAL TUNNEL RELEASE;  Surgeon: Mcarthur Rossetti, MD;  [facility]: [facility];  Service: Orthopedics;  Laterality: Right;   CHOLECYSTECTOMY N/A 02/24/2014   Procedure: LAPAROSCOPIC CHOLECYSTECTOMY;  Surgeon: Coralie Keens, MD;  Location: [place];  Service: General;  Laterality: N/A;   COLONOSCOPY     CYSTOLITHOTOMY  11/ 2007      [facility] [place] [facility]   Procedure: CYSTOSCOPY FLEXIBLE;  Surgeon: Irine Seal, MD;  Location: [facility];  Service: Urology;  Laterality: N/A;   CYSTOSCOPY W/ LITHOLAPAXY / EHL  02-24-2007   dr Janice Norrie  [facility]   dental implant     No teeth at this time waiting for them to be made as of 01-30-19   ESOPHAGOGASTRODUODENOSCOPY (EGD) WITH PROPOFOL N/A 05/29/2019   Procedure: ESOPHAGOGASTRODUODENOSCOPY (EGD) WITH PROPOFOL;  Surgeon: Rogene Houston, MD;  [facility];  Service: Endoscopy;  Laterality: N/A;  [place] IMPLANT N/A 09/05/2017   Procedure: GOLD SEED IMPLANT;  Surgeon: Irine Seal, MD;  Location: [facility];  Service: Urology;  Laterality: N/A;   INSERTION OF SUPRAPUBIC CATHETER N/A 04/18/2018   Procedure:  SUPRAPUBIC TUBE CHANGE;  Surgeon: Irine Seal, MD;  [facility];  Service: Urology;  Laterality: N/A;   IR CATHETER TUBE CHANGE  02/24/2018   SPACE OAR INSTILLATION N/A 09/05/2017   Procedure: SPACE OAR INSTILLATION;  Surgeon: Irine Seal, MD;  Location: [facility];  Service: Urology;  Laterality: N/A;   TOTAL ANKLE ARTHROPLASTY Right 04/20/2015   Procedure: TOTAL ANKLE ARTHOPLASTY;  Surgeon: Newt Minion, MD;  Location: [place];  Service: Orthopedics;  Laterality: Right;   TRANSTHORACIC ECHOCARDIOGRAM  11/16/2012   ef 55-60%,  grade 1 diastolic dysfunction/  mild dilated ascending aorta/  mild MR/ trivial TR   TRANSURETHRAL RESECTION OF PROSTATE N/A 02/03/2019   Procedure: TRANSURETHRAL RESECTION OF THE PROSTATE (TURP);  Surgeon: Irine Seal, MD;  [facility];  Service: Urology;  Laterality: N/A;   VIDEO ASSISTED THORACOSCOPY (VATS)/ LOBECTOMY Right 07/04/2015   Procedure: VIDEO ASSISTED THORACOSCOPY (VATS)/ RIGHT UPPER LOBECTOMY;  Surgeon: Melrose Nakayama, MD;  Location: [place];  Service: Thoracic;  Laterality: Right;   VIDEO BRONCHOSCOPY N/A 06/17/2015   Procedure: VIDEO BRONCHOSCOPY;  Surgeon: Melrose Nakayama, MD;  Location: [place];  Service: Thoracic;  Laterality: N/A;   VIDEO BRONCHOSCOPY WITH ENDOBRONCHIAL NAVIGATION N/A 06/17/2015   Procedure: VIDEO BRONCHOSCOPY WITH ENDOBRONCHIAL NAVIGATION;  Surgeon: Melrose Nakayama, MD;  Location: [place];  Service: Thoracic;  Laterality: N/A;   VIDEO BRONCHOSCOPY WITH ENDOBRONCHIAL ULTRASOUND N/A 06/17/2015   Procedure: VIDEO BRONCHOSCOPY WITH ENDOBRONCHIAL ULTRASOUND;  Surgeon: Melrose Nakayama, MD;  Location: [place];  Service: Thoracic;  Laterality: N/A;    Social History   Socioeconomic History   Marital status: Married    Spouse name: Jerl Santos   Number of children:  2   Years of education: college   Highest education level: Not on file  Occupational History   Occupation: retired  Tobacco Use   Smoking  status: Former    Packs/day: 0.50    Years: 40.00    Pack years: 20.00    Types: Cigarettes    Quit date: 02/13/2015    Years since quitting: 5.9   Smokeless tobacco: Never  Vaping Use   Vaping Use: Never used  Substance and Sexual Activity   Alcohol use: No    Alcohol/week: 0.0 standard drinks   Drug use: No   Sexual activity: Not Currently  Other Topics Concern   Not on file  Social History Narrative   Lives with wife, daughter and son in law temporarily to help them out   Right Handed   Drinks caffeine occassionally   Social Determinants of Health   Financial Resource Strain: Not on file  Food Insecurity: Not on file  Transportation Needs: Not on file  Physical Activity: Not on file  Stress: Not on file  Social Connections: Not on file  Intimate Partner Violence: Not on file    Family History  Problem Relation Age of Onset   Cancer Father        Asbestos   Colon cancer Neg Hx    Colon polyps Neg Hx    Kidney disease Neg Hx    Esophageal cancer Neg Hx    Gallbladder disease Neg Hx    Heart disease Neg Hx    Diabetes Neg Hx        Objective: Vitals:   02/09/21 0954  BP: (!) 147/86  Pulse: 78  Temp: 98 F (36.7 C)     Physical Exam  Lab Results:  No results found for this or any previous visit (from the past 24 hour(s)). UA is clear.   BMET No results for input(s): NA, K, CL, CO2, GLUCOSE, BUN, CREATININE, CALCIUM in the last 72 hours. PSA Lab Results  Component Value Date   PSA1 <0.1 07/28/2020   PSA1 <0.1 03/24/2020    PSA  Date Value Ref Range Status  08/21/2019 <0.1 < OR = 4.0 ng/mL Final    Comment:    The total PSA value from this assay system is  standardized against the WHO standard. The test  result will be approximately 20% lower when compared  to the equimolar-standardized total PSA (Beckman  Coulter). Comparison of serial PSA results should be  interpreted with this fact in mind. . This test was performed using the  Siemens  chemiluminescent method. Values obtained from  different assay methods cannot be used interchangeably. PSA levels, regardless of value, should not be interpreted as absolute evidence of the presence or absence of disease.   04/06/2019 <0.1 < OR = 4.0 ng/mL Final    Comment:    The total PSA value from this assay system is  standardized against the WHO standard. The test  result will be approximately 20% lower when compared  to the equimolar-standardized total PSA (Beckman  Coulter). Comparison of serial PSA results should be  interpreted with this fact in mind. . This test was performed using the Siemens  chemiluminescent method. Values obtained from  different assay methods cannot be used interchangeably. PSA levels, regardless of value, should not be interpreted as absolute evidence of the presence or absence of disease.   10/26/2008 0.69 0.10 - 4.00 ng/mL Final    Comment:    See lab report for associated comment(s)  Testosterone  Date Value Ref Range Status  07/28/2020 286 264 - 916 ng/dL Final    Comment:    Adult male reference interval is based on a population of healthy nonobese males (BMI <30) between 55 and 77 years old. [place], [place] [phone]. PMID: [id number].   03/24/2020 221 (L) 264 - 916 ng/dL Final    Comment:    Adult male reference interval is based on a population of healthy nonobese males (BMI <30) between 15 and 62 years old. [place], [place] [phone]. PMID: [id number].   08/21/2019 15 (L) 250 - 827 ng/dL Final    Comment:    In hypogonadal males, Testosterone, Total, LC/MS/MS, is the recommended assay due to the diminished accuracy of immunoassay at levels below 250 ng/dL. This test code [phone]) must be collected in a red-top tube with no gel.        Studies/Results:   Assessment & Plan: Prostate cancer.   His last PSA remained undetectible with testosterone recovery.   I will get labs today and have  him return in 6 months with labs.   OAB with incontinence with an elevated PVR.  He is doing better with the PTNS and Gemtesa but has some frequency and nocturia but is staying dry.     Recurrent UTI's.  He has a clear UA today.  ED.  He had success with the prostin but thinks the [place] gave him the wrong needle he thinks and it was too big.  He will bring it for me to see.  He is having success with the Viagra.    No orders of the defined types were placed in this encounter.    Orders Placed This Encounter  Procedures   PSA   Testosterone   PSA    Standing Status:   Future    Standing Expiration Date:   02/09/2022   Testosterone    Standing Status:   Future    Standing Expiration Date:   02/09/2022   Urinalysis, Routine w reflex microscopic        Return in about 6 months (around 08/09/2021) for with labs.   CC: [name], PA-C      Irine Seal 02/09/2021 Patient ID: Jonathan [name], male   DOB: 1940-06-07, 80 y.o.   MRN: [id number] Patient ID: Jonathan [name], male   DOB: [birth year], 80 y.o.   MRN: [id number]

## 2021-02-10 LAB — URINALYSIS, ROUTINE W REFLEX MICROSCOPIC
Bilirubin, UA: NEGATIVE
Glucose, UA: NEGATIVE
Ketones, UA: NEGATIVE
Leukocytes,UA: NEGATIVE
Nitrite, UA: NEGATIVE
Protein,UA: NEGATIVE
RBC, UA: NEGATIVE
Specific Gravity, UA: 1.025 (ref 1.005–1.030)
Urobilinogen, Ur: 0.2 mg/dL (ref 0.2–1.0)
pH, UA: 6 (ref 5.0–7.5)

## 2021-02-10 LAB — PSA: Prostate Specific Ag, Serum: 0.6 ng/mL (ref 0.0–4.0)

## 2021-02-10 LAB — TESTOSTERONE: Testosterone: 310 ng/dL (ref 264–916)

## 2021-02-10 NOTE — Addendum Note (Signed)
Addended by: Tyrone Apple on: 02/10/2021 11:34 AM   Modules accepted: Orders

## 2021-02-13 ENCOUNTER — Ambulatory Visit: Payer: Non-veteran care

## 2021-02-13 NOTE — Progress Notes (Signed)
Letter mailed with results. 

## 2021-02-16 DIAGNOSIS — R109 Unspecified abdominal pain: Secondary | ICD-10-CM | POA: Diagnosis not present

## 2021-02-16 DIAGNOSIS — R5381 Other malaise: Secondary | ICD-10-CM | POA: Diagnosis not present

## 2021-02-16 DIAGNOSIS — I499 Cardiac arrhythmia, unspecified: Secondary | ICD-10-CM | POA: Diagnosis not present

## 2021-02-16 DIAGNOSIS — N189 Chronic kidney disease, unspecified: Secondary | ICD-10-CM | POA: Diagnosis not present

## 2021-02-16 DIAGNOSIS — E1122 Type 2 diabetes mellitus with diabetic chronic kidney disease: Secondary | ICD-10-CM | POA: Diagnosis not present

## 2021-02-16 DIAGNOSIS — I491 Atrial premature depolarization: Secondary | ICD-10-CM | POA: Diagnosis not present

## 2021-02-16 DIAGNOSIS — R531 Weakness: Secondary | ICD-10-CM | POA: Diagnosis not present

## 2021-02-16 DIAGNOSIS — R079 Chest pain, unspecified: Secondary | ICD-10-CM | POA: Diagnosis not present

## 2021-02-16 DIAGNOSIS — I498 Other specified cardiac arrhythmias: Secondary | ICD-10-CM | POA: Diagnosis not present

## 2021-02-16 DIAGNOSIS — R42 Dizziness and giddiness: Secondary | ICD-10-CM | POA: Diagnosis not present

## 2021-02-16 DIAGNOSIS — I129 Hypertensive chronic kidney disease with stage 1 through stage 4 chronic kidney disease, or unspecified chronic kidney disease: Secondary | ICD-10-CM | POA: Diagnosis not present

## 2021-02-23 DIAGNOSIS — R42 Dizziness and giddiness: Secondary | ICD-10-CM | POA: Diagnosis not present

## 2021-02-23 DIAGNOSIS — M545 Low back pain, unspecified: Secondary | ICD-10-CM | POA: Diagnosis not present

## 2021-02-23 DIAGNOSIS — R2689 Other abnormalities of gait and mobility: Secondary | ICD-10-CM | POA: Diagnosis not present

## 2021-02-23 DIAGNOSIS — J701 Chronic and other pulmonary manifestations due to radiation: Secondary | ICD-10-CM | POA: Diagnosis not present

## 2021-02-23 DIAGNOSIS — I10 Essential (primary) hypertension: Secondary | ICD-10-CM | POA: Diagnosis not present

## 2021-02-23 DIAGNOSIS — R531 Weakness: Secondary | ICD-10-CM | POA: Diagnosis not present

## 2021-02-23 DIAGNOSIS — E1165 Type 2 diabetes mellitus with hyperglycemia: Secondary | ICD-10-CM | POA: Diagnosis not present

## 2021-03-08 ENCOUNTER — Other Ambulatory Visit: Payer: Self-pay

## 2021-03-08 ENCOUNTER — Ambulatory Visit (INDEPENDENT_AMBULATORY_CARE_PROVIDER_SITE_OTHER): Payer: Medicare Other

## 2021-03-08 DIAGNOSIS — N3941 Urge incontinence: Secondary | ICD-10-CM

## 2021-03-08 NOTE — Progress Notes (Signed)
PTNS  Session # 22 of 45  Health & Social Factors: diabetes Caffeine: 1 daily Alcohol: none Daytime voids #per day: 12 Night-time voids #per night: 6 Urgency: yes Incontinence Episodes #per day: 4 Ankle used: left Treatment Setting: 6 Feeling/ Response: heel throbbing  Comments: none  Performed By: Estill Bamberg   Follow Up: monthly PTNS

## 2021-03-09 ENCOUNTER — Ambulatory Visit: Payer: Non-veteran care

## 2021-03-22 DIAGNOSIS — E0865 Diabetes mellitus due to underlying condition with hyperglycemia: Secondary | ICD-10-CM | POA: Diagnosis not present

## 2021-03-22 DIAGNOSIS — R2689 Other abnormalities of gait and mobility: Secondary | ICD-10-CM | POA: Diagnosis not present

## 2021-03-22 DIAGNOSIS — R42 Dizziness and giddiness: Secondary | ICD-10-CM | POA: Diagnosis not present

## 2021-03-22 DIAGNOSIS — M792 Neuralgia and neuritis, unspecified: Secondary | ICD-10-CM | POA: Diagnosis not present

## 2021-03-22 DIAGNOSIS — I10 Essential (primary) hypertension: Secondary | ICD-10-CM | POA: Diagnosis not present

## 2021-03-22 DIAGNOSIS — J701 Chronic and other pulmonary manifestations due to radiation: Secondary | ICD-10-CM | POA: Diagnosis not present

## 2021-03-22 DIAGNOSIS — R531 Weakness: Secondary | ICD-10-CM | POA: Diagnosis not present

## 2021-03-22 DIAGNOSIS — M6281 Muscle weakness (generalized): Secondary | ICD-10-CM | POA: Diagnosis not present

## 2021-03-28 ENCOUNTER — Ambulatory Visit (INDEPENDENT_AMBULATORY_CARE_PROVIDER_SITE_OTHER): Payer: Medicare Other

## 2021-03-28 ENCOUNTER — Ambulatory Visit (INDEPENDENT_AMBULATORY_CARE_PROVIDER_SITE_OTHER): Payer: Medicare Other | Admitting: Orthopedic Surgery

## 2021-03-28 ENCOUNTER — Encounter (HOSPITAL_COMMUNITY): Payer: Self-pay

## 2021-03-28 ENCOUNTER — Other Ambulatory Visit: Payer: Self-pay

## 2021-03-28 ENCOUNTER — Encounter: Payer: Self-pay | Admitting: Orthopedic Surgery

## 2021-03-28 ENCOUNTER — Encounter (HOSPITAL_COMMUNITY)
Admission: RE | Admit: 2021-03-28 | Discharge: 2021-03-28 | Disposition: A | Discharge status: Home / Self Care | Payer: Medicare Other | Source: Ambulatory Visit | Attending: Neurosurgery | Admitting: Neurosurgery

## 2021-03-28 VITALS — BP 114/70 | HR 76 | Temp 97.9°F | Resp 18 | Ht >= 80 in | Wt 259.0 lb

## 2021-03-28 DIAGNOSIS — R29898 Other symptoms and signs involving the musculoskeletal system: Secondary | ICD-10-CM

## 2021-03-28 DIAGNOSIS — Z20822 Contact with and (suspected) exposure to covid-19: Secondary | ICD-10-CM | POA: Insufficient documentation

## 2021-03-28 DIAGNOSIS — I10 Essential (primary) hypertension: Secondary | ICD-10-CM | POA: Diagnosis not present

## 2021-03-28 DIAGNOSIS — Z01812 Encounter for preprocedural laboratory examination: Secondary | ICD-10-CM | POA: Diagnosis not present

## 2021-03-28 DIAGNOSIS — M25571 Pain in right ankle and joints of right foot: Secondary | ICD-10-CM | POA: Diagnosis not present

## 2021-03-28 DIAGNOSIS — E119 Type 2 diabetes mellitus without complications: Secondary | ICD-10-CM | POA: Insufficient documentation

## 2021-03-28 DIAGNOSIS — Z01818 Encounter for other preprocedural examination: Secondary | ICD-10-CM

## 2021-03-28 LAB — CBC
HCT: 40.6 % (ref 39.0–52.0)
Hemoglobin: 13.5 g/dL (ref 13.0–17.0)
MCH: 30.9 pg (ref 26.0–34.0)
MCHC: 33.3 g/dL (ref 30.0–36.0)
MCV: 92.9 fL (ref 80.0–100.0)
Platelets: 209 10*3/uL (ref 150–400)
RBC: 4.37 MIL/uL (ref 4.22–5.81)
RDW: 13.9 % (ref 11.5–15.5)
WBC: 7.5 10*3/uL (ref 4.0–10.5)
nRBC: 0 % (ref 0.0–0.2)

## 2021-03-28 LAB — SARS CORONAVIRUS 2 (TAT 6-24 HRS): SARS Coronavirus 2: NEGATIVE

## 2021-03-28 LAB — GLUCOSE, CAPILLARY: Glucose-Capillary: 158 mg/dL — ABNORMAL HIGH (ref 70–99)

## 2021-03-28 LAB — BASIC METABOLIC PANEL
Anion gap: 7 (ref 5–15)
BUN: 11 mg/dL (ref 8–23)
CO2: 28 mmol/L (ref 22–32)
Calcium: 9.1 mg/dL (ref 8.9–10.3)
Chloride: 102 mmol/L (ref 98–111)
Creatinine, Ser: 1.46 mg/dL — ABNORMAL HIGH (ref 0.61–1.24)
GFR, Estimated: 49 mL/min — ABNORMAL LOW (ref >60)
Glucose, Bld: 100 mg/dL — ABNORMAL HIGH (ref 70–99)
Potassium: 4 mmol/L (ref 3.5–5.1)
Sodium: 137 mmol/L (ref 135–145)

## 2021-03-28 LAB — SURGICAL PCR SCREEN
MRSA, PCR: NEGATIVE
Staphylococcus aureus: NEGATIVE

## 2021-03-28 LAB — HEMOGLOBIN A1C
Hgb A1c MFr Bld: 5.8 % — ABNORMAL HIGH (ref 4.8–5.6)
Mean Plasma Glucose: 119.76 mg/dL

## 2021-03-28 NOTE — Progress Notes (Signed)
PCP - [name], PA-C Cardiologist - denies  PPM/ICD - denies   Chest x-ray - 02/16/21 EKG - 02/16/21 Stress Test - October 2022 per pt, no abnormalities ECHO - 11/16/12 Cardiac Cath - denies  Sleep Study - denies  DM- Type 2 Fasting Blood Sugar - 100-110 Checks Blood Sugar every other day  Blood Thinner Instructions: n/a Aspirin Instructions: n/a  ERAS Protcol - yes, no drink   COVID TEST- 03/28/21 at PAT   Anesthesia review: no  Patient denies shortness of breath, fever, cough and chest pain at PAT appointment   All instructions explained to the patient, with a verbal understanding of the material. Patient agrees to go over the instructions while at home for a better understanding. Patient also instructed to wear a mask in public after being tested for COVID-19. The opportunity to ask questions was provided.

## 2021-03-28 NOTE — Pre-Procedure Instructions (Signed)
Surgical Instructions    Your procedure is scheduled on Friday, January 6th.  Report to [facility] Main Entrance "A" at 06:30 A.M., then check in with the Admitting office.  Call this number if you have problems the morning of surgery:  [phone]   If you have any questions prior to your surgery date call [phone]: Open Monday-Friday 8am-4pm    Remember:  Do not eat after midnight the night before your surgery  You may drink clear liquids until 05:30 AM the morning of your surgery.   Clear liquids allowed are: Water, Non-Citrus Juices (without pulp), Carbonated Beverages, Clear Tea, Black Coffee Only, and Gatorade    Take these medicines the morning of surgery with A SIP OF WATER  donepezil (ARICEPT) escitalopram (LEXAPRO) gabapentin (NEURONTIN)  Oxycodone HCl SPIRIVA HANDIHALER 18 MCG inhalation capsule tamsulosin (FLOMAX)  If needed: albuterol (PROVENTIL) nebulizer meclizine (ANTIVERT)  metoCLOPramide (REGLAN) pantoprazole (PROTONIX) timolol (TIMOPTIC) eye drops    Stop predniSONE (DELTASONE) 3 days prior to surgery.   As of today, STOP taking any Aspirin (unless otherwise instructed by your surgeon) Aleve, Naproxen, Ibuprofen, Motrin, Advil, Goody's, BC's, all herbal medications, fish oil, and all vitamins. This includes your diclofenac Sodium (VOLTAREN) 1 % GEL.           WHAT DO I DO ABOUT MY DIABETES MEDICATION?   Do not take LANTUS SOLOSTAR 100 UNIT/ML the night before surgery or the morning of surgery     HOW TO MANAGE YOUR DIABETES BEFORE AND AFTER SURGERY  Why is it important to control my blood sugar before and after surgery? Improving blood sugar levels before and after surgery helps healing and can limit problems. A way of improving blood sugar control is eating a healthy diet by:  Eating less sugar and carbohydrates  Increasing activity/exercise  Talking with your doctor about reaching your blood sugar goals High blood sugars (greater than  180 mg/dL) can raise your risk of infections and slow your recovery, so you will need to focus on controlling your diabetes during the weeks before surgery. Make sure that the doctor who takes care of your diabetes knows about your planned surgery including the date and location.  How do I manage my blood sugar before surgery? Check your blood sugar at least 4 times a day, starting 2 days before surgery, to make sure that the level is not too high or low.  Check your blood sugar the morning of your surgery when you wake up and every 2 hours until you get to the Short Stay unit.  If your blood sugar is less than 70 mg/dL, you will need to treat for low blood sugar: Do not take insulin. Treat a low blood sugar (less than 70 mg/dL) with  cup of clear juice (cranberry or apple), 4 glucose tablets, OR glucose gel. Recheck blood sugar in 15 minutes after treatment (to make sure it is greater than 70 mg/dL). If your blood sugar is not greater than 70 mg/dL on recheck, call [phone] for further instructions. Report your blood sugar to the short stay nurse when you get to Short Stay.  If you are admitted to the hospital after surgery: Your blood sugar will be checked by the staff and you will probably be given insulin after surgery (instead of oral diabetes medicines) to make sure you have good blood sugar levels. The goal for blood sugar control after surgery is 80-180 mg/dL.  Do NOT Smoke (Tobacco/Vaping) or drink Alcohol 24 hours prior to your procedure.  If you use a CPAP at night, you may bring all equipment for your overnight stay.   Contacts, glasses, piercing's, hearing aid's, dentures or partials may not be worn into surgery, please bring cases for these belongings.    For patients admitted to the hospital, discharge time will be determined by your treatment team.   Patients discharged the day of surgery will not be allowed to drive home, and someone needs to stay with  them for 24 hours.  NO VISITORS WILL BE ALLOWED IN PRE-OP WHERE PATIENTS GET READY FOR SURGERY.  ONLY 1 SUPPORT PERSON MAY BE PRESENT IN THE WAITING ROOM WHILE YOU ARE IN SURGERY.  IF YOU ARE TO BE ADMITTED, ONCE YOU ARE IN YOUR ROOM YOU WILL BE ALLOWED TWO (2) VISITORS.  Minor children may have two parents present. Special consideration for safety and communication needs will be reviewed on a case by case basis.   Special instructions:   [place]- Preparing For Surgery  Before surgery, you can play an important role. Because skin is not sterile, your skin needs to be as free of germs as possible. You can reduce the number of germs on your skin by washing with CHG (chlorahexidine gluconate) Soap before surgery.  CHG is an antiseptic cleaner which kills germs and bonds with the skin to continue killing germs even after washing.    Oral Hygiene is also important to reduce your risk of infection.  Remember - BRUSH YOUR TEETH THE MORNING OF SURGERY WITH YOUR REGULAR TOOTHPASTE  Please do not use if you have an allergy to CHG or antibacterial soaps. If your skin becomes reddened/irritated stop using the CHG.  Do not shave (including legs and underarms) for at least 48 hours prior to first CHG shower. It is OK to shave your face.  Please follow these instructions carefully.   Shower the NIGHT BEFORE SURGERY and the MORNING OF SURGERY  If you chose to wash your hair, wash your hair first as usual with your normal shampoo.  After you shampoo, rinse your hair and body thoroughly to remove the shampoo.  Use CHG Soap as you would any other liquid soap. You can apply CHG directly to the skin and wash gently with a scrungie or a clean washcloth.   Apply the CHG Soap to your body ONLY FROM THE NECK DOWN.  Do not use on open wounds or open sores. Avoid contact with your eyes, ears, mouth and genitals (private parts). Wash Face and genitals (private parts)  with your normal soap.   Wash thoroughly,  paying special attention to the area where your surgery will be performed.  Thoroughly rinse your body with warm water from the neck down.  DO NOT shower/wash with your normal soap after using and rinsing off the CHG Soap.  Pat yourself dry with a CLEAN TOWEL.  Wear CLEAN PAJAMAS to bed the night before surgery  Place CLEAN SHEETS on your bed the night before your surgery  DO NOT SLEEP WITH PETS.   Day of Surgery: Shower with CHG soap. Do not wear jewelry Do not wear lotions, powders, colognes, or deodorant. Men may shave face and neck. Do not bring valuables to the hospital. [facility] is not responsible for any belongings or valuables. Wear Clean/Comfortable clothing the morning of surgery Remember to brush your teeth WITH YOUR REGULAR TOOTHPASTE.   Please read over the following fact sheets that  you were given.   3 days prior to your procedure or After your COVID test   You are not required to quarantine however you are required to wear a well-fitting mask when you are out and around people not in your household. If your mask becomes wet or soiled, replace with a new one.   Wash your hands often with soap and water for 20 seconds or clean your hands with an alcohol-based hand sanitizer that contains at least 60% alcohol.   Do not share personal items.   Notify your provider:  o if you are in close contact with someone who has COVID  o or if you develop a fever of 100.4 or greater, sneezing, cough, sore throat, shortness of breath or body aches.

## 2021-03-28 NOTE — Progress Notes (Signed)
Office Visit Note   Patient: Jonathan [name]           Date of Birth: Mar 20, 1941           MRN: [id number] Visit Date: 03/28/2021              Requested by: [name], PA-C [place],  [place] [address] PCP: [name], PA-C  Chief Complaint  Patient presents with   Right Ankle - Pain      HPI: Patient is a 81 year old gentleman who is status post right total ankle arthroplasty for service related traumatic arthritis.  With patient's progressive upper extremity and lower extremity neuropathy patient has been having increasing difficulty ambulation.  Assessment & Plan: Visit Diagnoses:  1. Pain in right ankle and joints of right foot   2. Ankle weakness     Plan: Recommended patient for a power mobility device.  With his progressive weakness of the upper and lower extremities patient is at increased risk of fall and injury.  Follow-Up Instructions: Return if symptoms worsen or fail to improve.   Ortho Exam  Patient is alert, oriented, no adenopathy, well-dressed, normal affect, normal respiratory effort. Examination patient has difficulty getting from a sitting to a standing position.  He does not have the arm or leg strength to stand without difficulty..  Patient does have a antalgic gait he is wearing a brace on the right ankle has pain with the left ankle with swelling and has worn a brace on the left ankle as well.  Patient has no pain with passive range of motion of his joints.  There is no indication for hardware failure of the total ankle arthroplasty.  Patient has intrinsic weakness of both hands.  Imaging: XR Ankle Complete Right  Result Date: 03/28/2021 Three-view radiographs of the right ankle shows a stable total ankle arthroplasty without implant loosening.  The joint is congruent.  No varus or valgus malalignment.  No images are attached to the encounter.  Labs: Lab Results  Component Value Date   HGBA1C 5.8 (H) 03/28/2021   HGBA1C 5.9 (H)  01/30/2019   HGBA1C 6.3 (H) 06/13/2015   ESRSEDRATE 26 01/06/2021   ESRSEDRATE 20 01/06/2020   REPTSTATUS 10/05/2019 FINAL 10/04/2019   GRAMSTAIN  06/17/2015    NO WBC SEEN NO SQUAMOUS EPITHELIAL CELLS SEEN NO ORGANISMS SEEN Performed at [facility] (A) 10/04/2019    <10,000 COLONIES/mL INSIGNIFICANT GROWTH Performed at [facility] [address] [place], [place] [address]    LABORGA MORGANELLA MORGANII (A) 09/18/2019     Lab Results  Component Value Date   ALBUMIN 3.4 (L) 12/21/2017   ALBUMIN 3.8 12/10/2017   ALBUMIN 3.6 10/25/2016    No results found for: MG No results found for: VD25OH  No results found for: PREALBUMIN CBC EXTENDED Latest Ref Rng & Units 03/28/2021 10/04/2019 01/30/2019  WBC 4.0 - 10.5 K/uL 7.5 5.0 5.0  RBC 4.22 - 5.81 MIL/uL 4.37 3.82(L) 4.02(L)  HGB 13.0 - 17.0 g/dL 13.5 11.6(L) 11.9(L)  HCT 39.0 - 52.0 % 40.6 35.5(L) 37.4(L)  PLT 150 - 400 K/uL 209 188 181  NEUTROABS 1.7 - 7.7 K/uL - 3.2 -  LYMPHSABS 0.7 - 4.0 K/uL - 1.3 -     There is no height or weight on file to calculate BMI.  Orders:  Orders Placed This Encounter  Procedures   XR Ankle Complete Right   No orders of the defined  types were placed in this encounter.    Procedures: No procedures performed  Clinical Data: No additional findings.  ROS:  All other systems negative, except as noted in the HPI. Review of Systems  Objective: Vital Signs: There were no vitals taken for this visit.  Specialty Comments:  No specialty comments available.  PMFS History: Patient Active Problem List   Diagnosis Date Noted   Gait abnormality 01/06/2020   History of peptic ulcer disease    History of prostate cancer 04/10/2019   Urge incontinence 04/10/2019   Urinary retention 04/18/2018   Malignant neoplasm of prostate ([place]) 07/10/2017   Arthritis of right subtalar joint 04/18/2017   Claw hand due to intrinsic minus deformity, right 05/22/2016   Idiopathic  chronic venous hypertension of both lower extremities with inflammation 05/22/2016   Fibrosis of subtalar joint, right 03/08/2016   Primary malignant neoplasm of left upper lobe of lung ([place]) 09/21/2015   Lung cancer ([place]) 07/04/2015   Diabetes mellitus type II, controlled ([place]) 06/01/2015   Neoplasm of uncertain behavior of right upper lobe of lung 05/31/2015   Neoplasm of uncertain behavior of left upper lobe of lung 05/31/2015   Emphysema of lung ([place])    Kidney stones    Osteoporosis    H/O total ankle replacement, right 04/20/2015   Carpal tunnel syndrome - right 07/09/2014   Bloating 06/14/2014   History of colonic polyps 06/14/2014   Diabetic polyneuropathy associated with type 2 diabetes mellitus ([place]) 12/05/2012   Lesion of ulnar nerve 12/05/2012   Syncope 11/15/2012   Gout 11/15/2012   Chronic constipation 04/02/2009   CYSTITIS, RECURRENT 12/02/2008   COPD GOLD 2 s/p RULobectomy/ RT pneumonitis  10/26/2008   DIVERTICULUM, BLADDER 10/11/2008   INSOMNIA 07/24/2008   Acute cystitis without hematuria 07/20/2008   FATIGUE 07/20/2008   DEPRESSION 02/27/2008   CHEST PAIN UNSPECIFIED 02/27/2008   NICOTINE ADDICTION 08/12/2007   DYSLIPIDEMIA 07/22/2007   OBESITY 07/22/2007   ERECTILE DYSFUNCTION 07/22/2007   HYPERTENSION 07/22/2007   Past Medical History:  Diagnosis Date   Acquired bladder diverticulum    12/ 2012  resection diverticulum done at [facility] in [place], [place]   Age-related cataract of right eye    scheduled for catarat extraction 09-12-2017   Agent orange exposure    Aneurysm of infrarenal abdominal aorta    first dx 2017--- last CT 06-11-2017  measures 3.5cm   Anxiety    Chronic constipation    COPD with emphysema ([place])    06--12-2017  per pt no symptoms since Feb 2019   Depression    PTSD   Diabetes mellitus type 2, diet-controlled ([place])    Diabetic neuropathy ([place])    Dyslipidemia    Dyspnea on exertion    Erectile dysfunction    Feeling of incomplete  bladder emptying    Gait abnormality 01/06/2020   GERD (gastroesophageal reflux disease)    Gout    09-02-2017 last flare up 3 months ago   Hiatal hernia    History of atrial fibrillation    episode post op right lung lobectomy 07-04-2015   History of bladder stone    History of colon polyps    History of DVT of lower extremity    03/ 2019  bilateral lower extremity (superficial) ---  per treated w/ oral medication    History of gastritis    History of kidney stones    History of radiation therapy 09-14-2015  to 09-21-2015   left upper lung nodule -- 54Gy  in 3 fractions (18 Gy per fraction)   Hyperplasia of prostate with lower urinary tract symptoms (LUTS)    Hypertension    Lumbar spinal stenosis    Nephrolithiasis    Neurogenic bladder    Osteoarthritis    ankle, hands   Osteoporosis    Prostate cancer [facility]) urologist-  dr wrenn/  oncologist-  dr Tammi Klippel   dx 05-24-2017-- Stage T2b,  Gleason 4+4,  PSA 22.41,  vol 38cc--- started ADT 04/ 2019,  plan external radiation therapy   Radiation fibrosis of lung ([place])    hx left upper long nodule SBRT 06/ 2017   Squamous cell carcinoma of both lungs ([place]) last CT in epic dated 06-26-2017 no recurrence   dx 03/ 2017  non-small cell SCC via bronchoscopy w/ bx's by dr Roxan Hockey---  s/p  right VATS w/ right lobectomy and node dissection's 07-04-2015 /  pt had SBRT to left upper nodule Stage 1 ([facility]) completed 09-21-2015   Vertigo 2018   Wears dentures    bottom   Wears glasses     Family History  Problem Relation Age of Onset   Cancer Father        Asbestos   Colon cancer Neg Hx    Colon polyps Neg Hx    Kidney disease Neg Hx    Esophageal cancer Neg Hx    Gallbladder disease Neg Hx    Heart disease Neg Hx    Diabetes Neg Hx     Past Surgical History:  Procedure Laterality Date   BIOPSY  05/29/2019   Procedure: BIOPSY;  Surgeon: Rogene [place], MD;  [facility];  Service: Endoscopy;;  gastric ulcer    CARPAL TUNNEL RELEASE Right 07/09/2014   Procedure: RIGHT OPEN CARPAL TUNNEL RELEASE;  Surgeon: Mcarthur Rossetti, MD;  [facility]: [facility];  Service: Orthopedics;  Laterality: Right;   CHOLECYSTECTOMY N/A 02/24/2014   Procedure: LAPAROSCOPIC CHOLECYSTECTOMY;  Surgeon: Coralie Keens, MD;  Location: [facility];  Service: General;  Laterality: N/A;   COLONOSCOPY     CYSTOLITHOTOMY  11/ 2007      [facility] [place] [facility]/18/2018   Procedure: CYSTOSCOPY FLEXIBLE;  Surgeon: Irine Seal, MD;  Location: [facility];  Service: Urology;  Laterality: N/A;   CYSTOSCOPY W/ LITHOLAPAXY / EHL  02-24-2007   dr Janice Norrie  [facility]   dental implant     No teeth at this time waiting for them to be made as of 01-30-19   ESOPHAGOGASTRODUODENOSCOPY (EGD) WITH PROPOFOL N/A 05/29/2019   Procedure: ESOPHAGOGASTRODUODENOSCOPY (EGD) WITH PROPOFOL;  Surgeon: Rogene Houston, MD;  [facility];  Service: Endoscopy;  Laterality: N/A;  [place] IMPLANT N/A 09/05/2017   Procedure: GOLD SEED IMPLANT;  Surgeon: Irine Seal, MD;  Location: [facility];  Service: Urology;  Laterality: N/A;   INSERTION OF SUPRAPUBIC CATHETER N/A 04/18/2018   Procedure: SUPRAPUBIC TUBE CHANGE;  Surgeon: Irine Seal, MD;  Location[facility];  Service: Urology;  Laterality: N/A;   IR CATHETER TUBE CHANGE  02/24/2018   SPACE OAR INSTILLATION N/A 09/05/2017   Procedure: SPACE OAR INSTILLATION;  Surgeon: Irine Seal, MD;  Location: [facility];  Service: Urology;  Laterality: N/A;   TOTAL ANKLE ARTHROPLASTY Right 04/20/2015   Procedure: TOTAL ANKLE ARTHOPLASTY;  Surgeon: Newt Minion, MD;  Location: [facility];  Service: Orthopedics;  Laterality: Right;   TRANSTHORACIC ECHOCARDIOGRAM  11/16/2012   ef  55-60%,  grade 1 diastolic dysfunction/  mild dilated ascending aorta/  mild MR/ trivial TR   TRANSURETHRAL RESECTION OF PROSTATE N/A 02/03/2019   Procedure: TRANSURETHRAL RESECTION OF THE PROSTATE  (TURP);  Surgeon: Irine Seal, MD;  [facility];  Service: Urology;  Laterality: N/A;   VIDEO ASSISTED THORACOSCOPY (VATS)/ LOBECTOMY Right 07/04/2015   Procedure: VIDEO ASSISTED THORACOSCOPY (VATS)/ RIGHT UPPER LOBECTOMY;  Surgeon: Melrose Nakayama, MD;  Location: [facility];  Service: Thoracic;  Laterality: Right;   VIDEO BRONCHOSCOPY N/A 06/17/2015   Procedure: VIDEO BRONCHOSCOPY;  Surgeon: Melrose Nakayama, MD;  Location: [facility];  Service: Thoracic;  Laterality: N/A;   VIDEO BRONCHOSCOPY WITH ENDOBRONCHIAL NAVIGATION N/A 06/17/2015   Procedure: VIDEO BRONCHOSCOPY WITH ENDOBRONCHIAL NAVIGATION;  Surgeon: Melrose Nakayama, MD;  Location: [facility];  Service: Thoracic;  Laterality: N/A;   VIDEO BRONCHOSCOPY WITH ENDOBRONCHIAL ULTRASOUND N/A 06/17/2015   Procedure: VIDEO BRONCHOSCOPY WITH ENDOBRONCHIAL ULTRASOUND;  Surgeon: Melrose Nakayama, MD;  Location: [facility];  Service: Thoracic;  Laterality: N/A;   Social History   Occupational History   Occupation: retired  Tobacco Use   Smoking status: Former    Packs/day: 0.50    Years: 40.00    Pack years: 20.00    Types: Cigarettes    Quit date: 02/13/2015    Years since quitting: 6.1   Smokeless tobacco: Never  Vaping Use   Vaping Use: Never used  Substance and Sexual Activity   Alcohol use: No    Alcohol/week: 0.0 standard drinks   Drug use: No   Sexual activity: Not Currently

## 2021-03-29 ENCOUNTER — Inpatient Hospital Stay (HOSPITAL_COMMUNITY)
Admission: RE | Admit: 2021-03-29 | Discharge: 2021-03-29 | Disposition: A | Discharge status: Home / Self Care | Payer: Non-veteran care | Source: Ambulatory Visit

## 2021-03-29 DIAGNOSIS — E119 Type 2 diabetes mellitus without complications: Secondary | ICD-10-CM

## 2021-03-29 DIAGNOSIS — I10 Essential (primary) hypertension: Secondary | ICD-10-CM

## 2021-03-29 DIAGNOSIS — Z01818 Encounter for other preprocedural examination: Secondary | ICD-10-CM

## 2021-03-30 MED ORDER — VANCOMYCIN HCL 1500 MG/300ML IV SOLN
1500.0000 mg | INTRAVENOUS | Status: AC
  Administered 2021-03-31: 1500 mg via INTRAVENOUS
  Filled 2021-03-30: qty 300

## 2021-03-30 NOTE — Anesthesia Preprocedure Evaluation (Addendum)
Anesthesia Evaluation  Patient identified by MRN, date of birth, ID band Patient awake    Reviewed: Allergy & Precautions, NPO status , Patient's Chart, lab work & pertinent test results  Airway Mallampati: I  TM Distance: >3 FB Neck ROM: Full    Dental no notable dental hx. (+) Edentulous Upper, Edentulous Lower, Implants,    Pulmonary shortness of breath and with exertion, COPD, former smoker,    Pulmonary exam normal breath sounds clear to auscultation       Cardiovascular hypertension, On Medications, Pt. on medications and Pt. on home beta blockers + Peripheral Vascular Disease and + DVT  Normal cardiovascular exam Rhythm:regular Rate:Normal     Neuro/Psych PSYCHIATRIC DISORDERS Anxiety Depression  Neuromuscular disease    GI/Hepatic hiatal hernia, GERD  Medicated and Controlled,  Endo/Other  diabetes, Type 2obese  Renal/GU      Musculoskeletal  (+) Arthritis ,   Abdominal   Peds  Hematology   Anesthesia Other Findings Study Conclusions  - Left ventricle: The cavity size was normal. Systolic function was normal. The estimated ejection fraction was in the range of 55% to 60%. Wall motion was normal; there were no regional wall motion abnormalities. Doppler parameters are consistent with abnormal left ventricular relaxation (grade 1 diastolic dysfunction). - Mitral valve: Mild regurgitation. - Right ventricle: The cavity size was mildly dilated. Wall thickness was normal.  Reproductive/Obstetrics                            Anesthesia Physical  Anesthesia Plan  ASA: 3  Anesthesia Plan: General   Post-op Pain Management: Dilaudid IV and Ofirmev IV (intra-op)   Induction: Intravenous  PONV Risk Score and Plan: 1 and Ondansetron, Treatment may vary due to age or medical condition and Dexamethasone  Airway Management Planned: Oral ETT  Additional Equipment:  None  Intra-op Plan:   Post-operative Plan: Extubation in OR  Informed Consent: I have reviewed the patients History and Physical, chart, labs and discussed the procedure including the risks, benefits and alternatives for the proposed anesthesia with the patient or authorized representative who has indicated his/her understanding and acceptance.     Dental Advisory Given  Plan Discussed with: Anesthesiologist, CRNA and Surgeon  Anesthesia Plan Comments:         Anesthesia Quick Evaluation

## 2021-03-31 ENCOUNTER — Encounter (HOSPITAL_COMMUNITY): Payer: Self-pay | Admitting: Neurosurgery

## 2021-03-31 ENCOUNTER — Ambulatory Visit (HOSPITAL_COMMUNITY): Payer: Medicare Other | Admitting: Anesthesiology

## 2021-03-31 ENCOUNTER — Encounter (HOSPITAL_COMMUNITY)
Admission: RE | Disposition: A | Discharge status: Home / Self Care | Payer: Self-pay | Source: Home / Self Care | Attending: Neurosurgery

## 2021-03-31 ENCOUNTER — Observation Stay (HOSPITAL_COMMUNITY)
Admission: RE | Admit: 2021-03-31 | Discharge: 2021-03-31 | Disposition: A | Discharge status: Home / Self Care | Payer: Medicare Other | Attending: Neurosurgery | Admitting: Neurosurgery

## 2021-03-31 ENCOUNTER — Other Ambulatory Visit: Payer: Self-pay

## 2021-03-31 ENCOUNTER — Ambulatory Visit (HOSPITAL_COMMUNITY): Payer: Medicare Other

## 2021-03-31 DIAGNOSIS — E119 Type 2 diabetes mellitus without complications: Secondary | ICD-10-CM | POA: Diagnosis not present

## 2021-03-31 DIAGNOSIS — M48062 Spinal stenosis, lumbar region with neurogenic claudication: Principal | ICD-10-CM | POA: Insufficient documentation

## 2021-03-31 DIAGNOSIS — E1151 Type 2 diabetes mellitus with diabetic peripheral angiopathy without gangrene: Secondary | ICD-10-CM | POA: Diagnosis not present

## 2021-03-31 DIAGNOSIS — M48061 Spinal stenosis, lumbar region without neurogenic claudication: Secondary | ICD-10-CM | POA: Diagnosis not present

## 2021-03-31 DIAGNOSIS — Z87891 Personal history of nicotine dependence: Secondary | ICD-10-CM | POA: Insufficient documentation

## 2021-03-31 DIAGNOSIS — Z794 Long term (current) use of insulin: Secondary | ICD-10-CM | POA: Diagnosis not present

## 2021-03-31 DIAGNOSIS — Z96651 Presence of right artificial knee joint: Secondary | ICD-10-CM | POA: Insufficient documentation

## 2021-03-31 DIAGNOSIS — J449 Chronic obstructive pulmonary disease, unspecified: Secondary | ICD-10-CM | POA: Diagnosis not present

## 2021-03-31 DIAGNOSIS — Z981 Arthrodesis status: Secondary | ICD-10-CM | POA: Diagnosis not present

## 2021-03-31 DIAGNOSIS — Z419 Encounter for procedure for purposes other than remedying health state, unspecified: Secondary | ICD-10-CM

## 2021-03-31 DIAGNOSIS — I10 Essential (primary) hypertension: Secondary | ICD-10-CM | POA: Insufficient documentation

## 2021-03-31 DIAGNOSIS — Z79899 Other long term (current) drug therapy: Secondary | ICD-10-CM | POA: Insufficient documentation

## 2021-03-31 DIAGNOSIS — Z85118 Personal history of other malignant neoplasm of bronchus and lung: Secondary | ICD-10-CM | POA: Insufficient documentation

## 2021-03-31 HISTORY — PX: LUMBAR LAMINECTOMY/DECOMPRESSION MICRODISCECTOMY: SHX5026

## 2021-03-31 LAB — CBC
HCT: 40.8 % (ref 39.0–52.0)
Hemoglobin: 13.8 g/dL (ref 13.0–17.0)
MCH: 31 pg (ref 26.0–34.0)
MCHC: 33.8 g/dL (ref 30.0–36.0)
MCV: 91.7 fL (ref 80.0–100.0)
Platelets: 182 10*3/uL (ref 150–400)
RBC: 4.45 MIL/uL (ref 4.22–5.81)
RDW: 13.8 % (ref 11.5–15.5)
WBC: 8.4 10*3/uL (ref 4.0–10.5)
nRBC: 0 % (ref 0.0–0.2)

## 2021-03-31 LAB — CREATININE, SERUM
Creatinine, Ser: 1.28 mg/dL — ABNORMAL HIGH (ref 0.61–1.24)
GFR, Estimated: 57 mL/min — ABNORMAL LOW (ref >60)

## 2021-03-31 LAB — GLUCOSE, CAPILLARY
Glucose-Capillary: 110 mg/dL — ABNORMAL HIGH (ref 70–99)
Glucose-Capillary: 117 mg/dL — ABNORMAL HIGH (ref 70–99)
Glucose-Capillary: 130 mg/dL — ABNORMAL HIGH (ref 70–99)

## 2021-03-31 SURGERY — LUMBAR LAMINECTOMY/DECOMPRESSION MICRODISCECTOMY 1 LEVEL
Anesthesia: General | Laterality: Right

## 2021-03-31 MED ORDER — DEXAMETHASONE SODIUM PHOSPHATE 10 MG/ML IJ SOLN
INTRAMUSCULAR | Status: AC
  Filled 2021-03-31: qty 1

## 2021-03-31 MED ORDER — TAMSULOSIN HCL 0.4 MG PO CAPS: 0.4000 mg | ORAL_CAPSULE | Freq: Every day | ORAL | Status: DC

## 2021-03-31 MED ORDER — ATENOLOL 50 MG PO TABS: 50.0000 mg | ORAL_TABLET | Freq: Every day | ORAL | Status: DC

## 2021-03-31 MED ORDER — 0.9 % SODIUM CHLORIDE (POUR BTL) OPTIME
TOPICAL | Status: DC | PRN
  Administered 2021-03-31: 1000 mL

## 2021-03-31 MED ORDER — TIMOLOL MALEATE 0.5 % OP SOLN
1.0000 [drp] | Freq: Two times a day (BID) | OPHTHALMIC | Status: DC
  Filled 2021-03-31: qty 5

## 2021-03-31 MED ORDER — TERBINAFINE HCL 250 MG PO TABS
250.0000 mg | ORAL_TABLET | Freq: Two times a day (BID) | ORAL | Status: DC
  Filled 2021-03-31: qty 1

## 2021-03-31 MED ORDER — ACETAMINOPHEN 10 MG/ML IV SOLN
INTRAVENOUS | Status: AC
  Filled 2021-03-31: qty 100

## 2021-03-31 MED ORDER — ACETAMINOPHEN 325 MG PO TABS: 650.0000 mg | ORAL_TABLET | ORAL | Status: DC | PRN

## 2021-03-31 MED ORDER — SODIUM CHLORIDE 0.9 % IV SOLN: 250.0000 mL | INTRAVENOUS | Status: DC

## 2021-03-31 MED ORDER — INSULIN GLARGINE 100 UNIT/ML SOLOSTAR PEN: 2.5000 [IU] | PEN_INJECTOR | Freq: Every day | SUBCUTANEOUS | Status: DC

## 2021-03-31 MED ORDER — SODIUM CHLORIDE 0.9% FLUSH
3.0000 mL | Freq: Two times a day (BID) | INTRAVENOUS | Status: DC
  Administered 2021-03-31: 3 mL via INTRAVENOUS

## 2021-03-31 MED ORDER — GABAPENTIN 400 MG PO CAPS
800.0000 mg | ORAL_CAPSULE | Freq: Two times a day (BID) | ORAL | Status: DC
  Administered 2021-03-31: 800 mg via ORAL
  Filled 2021-03-31: qty 2

## 2021-03-31 MED ORDER — BUPIVACAINE HCL 0.5 % IJ SOLN
INTRAMUSCULAR | Status: DC | PRN
Start: 2021-03-31 — End: 2021-03-31
  Administered 2021-03-31: 20 mL

## 2021-03-31 MED ORDER — ALBUTEROL SULFATE (2.5 MG/3ML) 0.083% IN NEBU: 2.5000 mg | INHALATION_SOLUTION | Freq: Four times a day (QID) | RESPIRATORY_TRACT | Status: DC | PRN

## 2021-03-31 MED ORDER — EPHEDRINE SULFATE-NACL 50-0.9 MG/10ML-% IV SOSY
PREFILLED_SYRINGE | INTRAVENOUS | Status: DC | PRN
  Administered 2021-03-31: 10 mg via INTRAVENOUS
  Administered 2021-03-31: 5 mg via INTRAVENOUS

## 2021-03-31 MED ORDER — ATENOLOL-CHLORTHALIDONE 50-25 MG PO TABS: 1.0000 | ORAL_TABLET | Freq: Every day | ORAL | Status: DC

## 2021-03-31 MED ORDER — PROPOFOL 10 MG/ML IV BOLUS
INTRAVENOUS | Status: AC
  Filled 2021-03-31: qty 20

## 2021-03-31 MED ORDER — SUGAMMADEX SODIUM 200 MG/2ML IV SOLN
INTRAVENOUS | Status: DC | PRN
  Administered 2021-03-31: 400 mg via INTRAVENOUS

## 2021-03-31 MED ORDER — DIAZEPAM 5 MG PO TABS: 5.0000 mg | ORAL_TABLET | Freq: Four times a day (QID) | ORAL | Status: DC | PRN

## 2021-03-31 MED ORDER — LIDOCAINE 2% (20 MG/ML) 5 ML SYRINGE
INTRAMUSCULAR | Status: AC
  Filled 2021-03-31: qty 5

## 2021-03-31 MED ORDER — ENOXAPARIN SODIUM 40 MG/0.4ML IJ SOSY: 40.0000 mg | PREFILLED_SYRINGE | INTRAMUSCULAR | Status: DC

## 2021-03-31 MED ORDER — NETARSUDIL-LATANOPROST 0.02-0.005 % OP SOLN: 1.0000 [drp] | Freq: Every day | OPHTHALMIC | Status: DC

## 2021-03-31 MED ORDER — ACETAMINOPHEN 10 MG/ML IV SOLN
INTRAVENOUS | Status: DC | PRN
  Administered 2021-03-31: 1000 mg via INTRAVENOUS

## 2021-03-31 MED ORDER — LIDOCAINE-EPINEPHRINE 1 %-1:100000 IJ SOLN
INTRAMUSCULAR | Status: DC | PRN
  Administered 2021-03-31: 10 mL

## 2021-03-31 MED ORDER — ACETAMINOPHEN 325 MG PO TABS: 325.0000 mg | ORAL_TABLET | ORAL | Status: DC | PRN

## 2021-03-31 MED ORDER — LATANOPROST 0.005 % OP SOLN
1.0000 [drp] | Freq: Every day | OPHTHALMIC | Status: DC
  Filled 2021-03-31: qty 2.5

## 2021-03-31 MED ORDER — ORAL CARE MOUTH RINSE: 15.0000 mL | Freq: Once | OROMUCOSAL | Status: AC

## 2021-03-31 MED ORDER — FENTANYL CITRATE (PF) 250 MCG/5ML IJ SOLN
INTRAMUSCULAR | Status: AC
  Filled 2021-03-31: qty 5

## 2021-03-31 MED ORDER — NETARSUDIL DIMESYLATE 0.02 % OP SOLN: 1.0000 [drp] | Freq: Every day | OPHTHALMIC | Status: DC

## 2021-03-31 MED ORDER — FENTANYL CITRATE (PF) 250 MCG/5ML IJ SOLN
INTRAMUSCULAR | Status: DC | PRN
  Administered 2021-03-31: 100 ug via INTRAVENOUS
  Administered 2021-03-31: 50 ug via INTRAVENOUS

## 2021-03-31 MED ORDER — LACTATED RINGERS IV SOLN: INTRAVENOUS | Status: DC

## 2021-03-31 MED ORDER — THROMBIN 5000 UNITS EX SOLR
CUTANEOUS | Status: DC | PRN
  Administered 2021-03-31: 10000 [IU] via TOPICAL

## 2021-03-31 MED ORDER — POTASSIUM CHLORIDE IN NACL 20-0.9 MEQ/L-% IV SOLN: INTRAVENOUS | Status: DC

## 2021-03-31 MED ORDER — MEPERIDINE HCL 25 MG/ML IJ SOLN: 6.2500 mg | INTRAMUSCULAR | Status: DC | PRN

## 2021-03-31 MED ORDER — CHLORTHALIDONE 25 MG PO TABS
25.0000 mg | ORAL_TABLET | Freq: Every day | ORAL | Status: DC
  Filled 2021-03-31: qty 1

## 2021-03-31 MED ORDER — CHLORHEXIDINE GLUCONATE CLOTH 2 % EX PADS: 6.0000 | MEDICATED_PAD | Freq: Once | CUTANEOUS | Status: DC

## 2021-03-31 MED ORDER — LIDOCAINE-EPINEPHRINE 1 %-1:100000 IJ SOLN
INTRAMUSCULAR | Status: AC
  Filled 2021-03-31: qty 1

## 2021-03-31 MED ORDER — ROCURONIUM BROMIDE 10 MG/ML (PF) SYRINGE
PREFILLED_SYRINGE | INTRAVENOUS | Status: DC | PRN
  Administered 2021-03-31: 100 mg via INTRAVENOUS

## 2021-03-31 MED ORDER — ONDANSETRON HCL 4 MG/2ML IJ SOLN
INTRAMUSCULAR | Status: DC | PRN
  Administered 2021-03-31: 4 mg via INTRAVENOUS

## 2021-03-31 MED ORDER — KETOROLAC TROMETHAMINE 30 MG/ML IJ SOLN
30.0000 mg | Freq: Once | INTRAMUSCULAR | Status: AC
  Administered 2021-03-31: 30 mg via INTRAVENOUS
  Filled 2021-03-31: qty 1

## 2021-03-31 MED ORDER — ACETAMINOPHEN 160 MG/5ML PO SOLN: 325.0000 mg | ORAL | Status: DC | PRN

## 2021-03-31 MED ORDER — OXYCODONE HCL 5 MG PO TABS: 5.0000 mg | ORAL_TABLET | ORAL | Status: DC | PRN

## 2021-03-31 MED ORDER — OXYCODONE HCL 5 MG/5ML PO SOLN: 5.0000 mg | Freq: Once | ORAL | Status: DC | PRN

## 2021-03-31 MED ORDER — PANTOPRAZOLE SODIUM 20 MG PO TBEC: 20.0000 mg | DELAYED_RELEASE_TABLET | Freq: Every day | ORAL | Status: DC | PRN

## 2021-03-31 MED ORDER — MUPIROCIN 2 % EX OINT: 1.0000 "application " | TOPICAL_OINTMENT | Freq: Two times a day (BID) | CUTANEOUS | Status: DC | PRN

## 2021-03-31 MED ORDER — MECLIZINE HCL 12.5 MG PO TABS: 6.2500 mg | ORAL_TABLET | Freq: Four times a day (QID) | ORAL | Status: DC | PRN

## 2021-03-31 MED ORDER — ACETAMINOPHEN 650 MG RE SUPP: 650.0000 mg | RECTAL | Status: DC | PRN

## 2021-03-31 MED ORDER — HEMOSTATIC AGENTS (NO CHARGE) OPTIME
TOPICAL | Status: DC | PRN
  Administered 2021-03-31: 1 via TOPICAL

## 2021-03-31 MED ORDER — INSULIN ASPART 100 UNIT/ML IJ SOLN: 0.0000 [IU] | Freq: Three times a day (TID) | INTRAMUSCULAR | Status: DC

## 2021-03-31 MED ORDER — PHENYLEPHRINE HCL-NACL 20-0.9 MG/250ML-% IV SOLN
INTRAVENOUS | Status: DC | PRN
  Administered 2021-03-31: 50 ug/min via INTRAVENOUS

## 2021-03-31 MED ORDER — ONDANSETRON HCL 4 MG/2ML IJ SOLN: 4.0000 mg | Freq: Four times a day (QID) | INTRAMUSCULAR | Status: DC | PRN

## 2021-03-31 MED ORDER — DEXAMETHASONE SODIUM PHOSPHATE 10 MG/ML IJ SOLN
INTRAMUSCULAR | Status: DC | PRN
Start: 2021-03-31 — End: 2021-03-31
  Administered 2021-03-31: 5 mg via INTRAVENOUS

## 2021-03-31 MED ORDER — BUPIVACAINE HCL (PF) 0.5 % IJ SOLN
INTRAMUSCULAR | Status: AC
  Filled 2021-03-31: qty 30

## 2021-03-31 MED ORDER — INSULIN ASPART 100 UNIT/ML IJ SOLN: 3.0000 [IU] | Freq: Three times a day (TID) | INTRAMUSCULAR | Status: DC

## 2021-03-31 MED ORDER — OXYCODONE HCL 5 MG PO TABS: 5.0000 mg | ORAL_TABLET | Freq: Once | ORAL | Status: DC | PRN

## 2021-03-31 MED ORDER — LIDOCAINE 2% (20 MG/ML) 5 ML SYRINGE
INTRAMUSCULAR | Status: DC | PRN
  Administered 2021-03-31: 100 mg via INTRAVENOUS

## 2021-03-31 MED ORDER — ROCURONIUM BROMIDE 10 MG/ML (PF) SYRINGE
PREFILLED_SYRINGE | INTRAVENOUS | Status: AC
  Filled 2021-03-31: qty 10

## 2021-03-31 MED ORDER — PROPOFOL 10 MG/ML IV BOLUS
INTRAVENOUS | Status: DC | PRN
  Administered 2021-03-31: 200 mg via INTRAVENOUS

## 2021-03-31 MED ORDER — CELECOXIB 200 MG PO CAPS: 200.0000 mg | ORAL_CAPSULE | Freq: Two times a day (BID) | ORAL | Status: DC

## 2021-03-31 MED ORDER — ONDANSETRON HCL 4 MG/2ML IJ SOLN: 4.0000 mg | Freq: Once | INTRAMUSCULAR | Status: DC | PRN

## 2021-03-31 MED ORDER — METOCLOPRAMIDE HCL 5 MG PO TABS: 5.0000 mg | ORAL_TABLET | Freq: Every day | ORAL | Status: DC | PRN

## 2021-03-31 MED ORDER — ONDANSETRON HCL 4 MG/2ML IJ SOLN
INTRAMUSCULAR | Status: AC
  Filled 2021-03-31: qty 2

## 2021-03-31 MED ORDER — PHENOL 1.4 % MT LIQD: 1.0000 | OROMUCOSAL | Status: DC | PRN

## 2021-03-31 MED ORDER — THROMBIN 5000 UNITS EX SOLR
CUTANEOUS | Status: AC
  Filled 2021-03-31: qty 10000

## 2021-03-31 MED ORDER — DONEPEZIL HCL 5 MG PO TABS
5.0000 mg | ORAL_TABLET | Freq: Every day | ORAL | Status: DC
Start: 2021-03-31 — End: 2021-03-31
  Administered 2021-03-31: 5 mg via ORAL
  Filled 2021-03-31: qty 1

## 2021-03-31 MED ORDER — ESCITALOPRAM OXALATE 10 MG PO TABS
5.0000 mg | ORAL_TABLET | Freq: Every day | ORAL | Status: DC
  Filled 2021-03-31: qty 0.5

## 2021-03-31 MED ORDER — CELECOXIB 200 MG PO CAPS
200.0000 mg | ORAL_CAPSULE | Freq: Two times a day (BID) | ORAL | Status: DC
  Administered 2021-03-31: 200 mg via ORAL
  Filled 2021-03-31: qty 1

## 2021-03-31 MED ORDER — OXYCODONE HCL 20 MG PO TABS: 4.0000 mg | ORAL_TABLET | ORAL | Status: DC

## 2021-03-31 MED ORDER — CHLORHEXIDINE GLUCONATE 0.12 % MT SOLN
15.0000 mL | Freq: Once | OROMUCOSAL | Status: AC
  Administered 2021-03-31: 15 mL via OROMUCOSAL
  Filled 2021-03-31: qty 15

## 2021-03-31 MED ORDER — ONDANSETRON HCL 4 MG PO TABS: 4.0000 mg | ORAL_TABLET | Freq: Four times a day (QID) | ORAL | Status: DC | PRN

## 2021-03-31 MED ORDER — SODIUM CHLORIDE 0.9% FLUSH: 3.0000 mL | INTRAVENOUS | Status: DC | PRN

## 2021-03-31 MED ORDER — TIOTROPIUM BROMIDE MONOHYDRATE 18 MCG IN CAPS: 18.0000 ug | ORAL_CAPSULE | Freq: Two times a day (BID) | RESPIRATORY_TRACT | Status: DC

## 2021-03-31 MED ORDER — MUPIROCIN CALCIUM 2 % EX CREA: 1.0000 "application " | TOPICAL_CREAM | Freq: Two times a day (BID) | CUTANEOUS | Status: DC | PRN

## 2021-03-31 MED ORDER — MENTHOL 3 MG MT LOZG: 1.0000 | LOZENGE | OROMUCOSAL | Status: DC | PRN

## 2021-03-31 MED ORDER — NYSTATIN 100000 UNIT/GM EX OINT
1.0000 "application " | TOPICAL_OINTMENT | Freq: Every day | CUTANEOUS | Status: DC | PRN
  Filled 2021-03-31: qty 15

## 2021-03-31 MED ORDER — OXYCODONE HCL 5 MG PO TABS: 10.0000 mg | ORAL_TABLET | ORAL | Status: DC | PRN

## 2021-03-31 MED ORDER — FENTANYL CITRATE (PF) 100 MCG/2ML IJ SOLN: 25.0000 ug | INTRAMUSCULAR | Status: DC | PRN

## 2021-03-31 MED ORDER — LINACLOTIDE 72 MCG PO CAPS
72.0000 ug | ORAL_CAPSULE | Freq: Every day | ORAL | Status: DC
  Filled 2021-03-31: qty 1

## 2021-03-31 MED ORDER — PHENYLEPHRINE 40 MCG/ML (10ML) SYRINGE FOR IV PUSH (FOR BLOOD PRESSURE SUPPORT)
PREFILLED_SYRINGE | INTRAVENOUS | Status: DC | PRN
  Administered 2021-03-31 (×2): 120 ug via INTRAVENOUS

## 2021-03-31 SURGICAL SUPPLY — 51 items
ADH SKN CLS APL DERMABOND .7 (GAUZE/BANDAGES/DRESSINGS) ×1
APL SKNCLS STERI-STRIP NONHPOA (GAUZE/BANDAGES/DRESSINGS)
BAG COUNTER SPONGE SURGICOUNT (BAG) ×3 IMPLANT
BAG SPNG CNTER NS LX DISP (BAG) ×2
BAND INSRT 18 STRL LF DISP RB (MISCELLANEOUS) ×2
BAND RUBBER #18 3X1/16 STRL (MISCELLANEOUS) ×4 IMPLANT
BENZOIN TINCTURE PRP APPL 2/3 (GAUZE/BANDAGES/DRESSINGS) IMPLANT
BLADE CLIPPER SURG (BLADE) IMPLANT
BUR MATCHSTICK NEURO 3.0 LAGG (BURR) ×2 IMPLANT
BUR PRECISION FLUTE 5.0 (BURR) ×1 IMPLANT
CANISTER SUCT 3000ML PPV (MISCELLANEOUS) ×2 IMPLANT
CARTRIDGE OIL MAESTRO DRILL (MISCELLANEOUS) ×1 IMPLANT
DECANTER SPIKE VIAL GLASS SM (MISCELLANEOUS) ×1 IMPLANT
DERMABOND ADVANCED (GAUZE/BANDAGES/DRESSINGS) ×1
DERMABOND ADVANCED .7 DNX12 (GAUZE/BANDAGES/DRESSINGS) ×1 IMPLANT
DIFFUSER DRILL AIR PNEUMATIC (MISCELLANEOUS) ×2 IMPLANT
DRAPE LAPAROTOMY 100X72X124 (DRAPES) ×2 IMPLANT
DRAPE MICROSCOPE LEICA (MISCELLANEOUS) ×2 IMPLANT
DRAPE SURG 17X23 STRL (DRAPES) ×2 IMPLANT
DURAPREP 26ML APPLICATOR (WOUND CARE) ×2 IMPLANT
ELECT REM PT RETURN 9FT ADLT (ELECTROSURGICAL) ×2
ELECTRODE REM PT RTRN 9FT ADLT (ELECTROSURGICAL) ×1 IMPLANT
GAUZE 4X4 16PLY ~~LOC~~+RFID DBL (SPONGE) ×1 IMPLANT
GAUZE SPONGE 4X4 12PLY STRL (GAUZE/BANDAGES/DRESSINGS) IMPLANT
GLOVE EXAM NITRILE XL STR (GLOVE) IMPLANT
GLOVE SURG LTX SZ6.5 (GLOVE) ×2 IMPLANT
GOWN STRL REUS W/ TWL LRG LVL3 (GOWN DISPOSABLE) ×2 IMPLANT
GOWN STRL REUS W/ TWL XL LVL3 (GOWN DISPOSABLE) IMPLANT
GOWN STRL REUS W/TWL 2XL LVL3 (GOWN DISPOSABLE) IMPLANT
GOWN STRL REUS W/TWL LRG LVL3 (GOWN DISPOSABLE) ×4
GOWN STRL REUS W/TWL XL LVL3 (GOWN DISPOSABLE)
KIT BASIN OR (CUSTOM PROCEDURE TRAY) ×2 IMPLANT
KIT TURNOVER KIT B (KITS) ×2 IMPLANT
NDL HYPO 25X1 1.5 SAFETY (NEEDLE) ×1 IMPLANT
NDL SPNL 18GX3.5 QUINCKE PK (NEEDLE) IMPLANT
NEEDLE HYPO 25X1 1.5 SAFETY (NEEDLE) ×2 IMPLANT
NEEDLE SPNL 18GX3.5 QUINCKE PK (NEEDLE) IMPLANT
NS IRRIG 1000ML POUR BTL (IV SOLUTION) ×2 IMPLANT
OIL CARTRIDGE MAESTRO DRILL (MISCELLANEOUS) ×2
PACK LAMINECTOMY NEURO (CUSTOM PROCEDURE TRAY) ×2 IMPLANT
PAD ARMBOARD 7.5X6 YLW CONV (MISCELLANEOUS) ×6 IMPLANT
SPONGE SURGIFOAM ABS GEL SZ50 (HEMOSTASIS) ×2 IMPLANT
SPONGE T-LAP 4X18 ~~LOC~~+RFID (SPONGE) ×1 IMPLANT
STRIP CLOSURE SKIN 1/2X4 (GAUZE/BANDAGES/DRESSINGS) IMPLANT
SUT VIC AB 0 CT1 18XCR BRD8 (SUTURE) ×1 IMPLANT
SUT VIC AB 0 CT1 8-18 (SUTURE) ×2
SUT VIC AB 2-0 CT1 18 (SUTURE) ×2 IMPLANT
SUT VIC AB 3-0 SH 8-18 (SUTURE) ×3 IMPLANT
TOWEL GREEN STERILE (TOWEL DISPOSABLE) ×2 IMPLANT
TOWEL GREEN STERILE FF (TOWEL DISPOSABLE) ×2 IMPLANT
WATER STERILE IRR 1000ML POUR (IV SOLUTION) ×2 IMPLANT

## 2021-03-31 NOTE — Progress Notes (Signed)
Patient awaiting transport via wheelchair by NT for discharge home; in no acute distress nor complaints of pain nor discomfort; incision on his back with skin glue and was clean, dry and intact; room was checked and accounted for all his belongings; discharge instructions concerning his medications, incision care, follow up appointment and when to call the doctor as needed were all discussed with patient by RN and he expressed understanding on the instructions given.

## 2021-03-31 NOTE — Discharge Summary (Signed)
Physician Discharge Summary  Patient ID: Jonathan [name] MRN: [id number] DOB/AGE: Nov 05, 1940 81 y.o.  Admit date: 03/31/2021 Discharge date: 03/31/2021  Admission Diagnoses:lumbar stenosis L3/4,4/5  Discharge Diagnoses:  Principal Problem:   Lumbar stenosis with neurogenic claudication   Discharged Condition: good  Hospital Course: [name] was admitted and taken to the OR for an uncomplicated lumbar decompression from L3-5. Post op he is ambulating, voiding, and tolerating a regular diet. His wound is clean, dry, and without signs of infection.    Treatments: surgery: as above  Discharge Exam: Blood pressure 133/72, pulse 61, temperature 97.7 F (36.5 C), resp. rate 18, height 6\' 8"  (2.032 m), weight 117.5 kg, SpO2 100 %. General appearance: alert, cooperative, appears stated age, and no distress Moving all extremities at baseline  Disposition: Discharge disposition: 01-Home or Self Care      Spinal stenosis, Lumbar region without neurogenic claudication  Allergies as of 03/31/2021       Reactions   Ampicillin Other (See Comments)   Don't work   Ciprofloxacin Other (See Comments)   Doesn't work   Levaquin [levofloxacin] Other (See Comments)   Doesn't work        Medication List     TAKE these medications    albuterol (2.5 MG/3ML) 0.083% nebulizer solution Commonly known as: PROVENTIL Take 2.5 mg by nebulization every 6 (six) hours as needed for shortness of breath.   alprostadil 20 MCG injection Commonly known as: EDEX 20 mcg by Intracavitary route as needed for erectile dysfunction. use no more than 3 times per week   alprostadil 500 MCG/ML injection Commonly known as: PROSTIN VR Alprostadil to 39mcg/ml, inject 39ml intracavernosal prn no more than once daily, 2-3x weekly.  Disp 5 - prefilled 1 ml syringe or a 64ml vial with a box of insulin syringes.   atenolol-chlorthalidone 50-25 MG tablet Commonly known as: TENORETIC Take 1 tablet by mouth at  bedtime.   capsaicin 0.025 % cream Commonly known as: ZOSTRIX Apply 1 application topically daily as needed (Skin Rash).   diclofenac Sodium 1 % Gel Commonly known as: VOLTAREN Apply 2 g topically 4 (four) times daily as needed (pain).   donepezil 5 MG tablet Commonly known as: ARICEPT Take 5 mg by mouth daily.   escitalopram 5 MG tablet Commonly known as: LEXAPRO Take 5 mg by mouth daily.   Fitted Briefs Maximum XL Misc Use briefs as needed daily for incontinence   freestyle lancets   FREESTYLE LITE test strip Generic drug: glucose blood   gabapentin 300 MG capsule Commonly known as: NEURONTIN Take 1 capsule (300 mg total) by mouth 2 (two) times daily. What changed: how much to take   Gemtesa 75 MG Tabs Generic drug: Vibegron Take 1 tablet by mouth daily.   hydroquinone 4 % cream Apply 1 application topically daily as needed (Skin Rash).   ibuprofen 600 MG tablet Commonly known as: ADVIL Take 600 mg by mouth every 6 (six) hours as needed for headache.   Lantus SoloStar 100 UNIT/ML Solostar Pen Generic drug: insulin glargine Inject 2.5 Units into the skin at bedtime.   latanoprost 0.005 % ophthalmic solution Commonly known as: XALATAN Place 1 drop into both eyes at bedtime.   Linzess 72 MCG capsule Generic drug: linaclotide Take 72 mcg by mouth daily before breakfast.   meclizine 12.5 MG tablet Commonly known as: ANTIVERT Take 6.25-12.5 mg by mouth every 6 (six) hours as needed for dizziness.   Metamucil Smooth Texture 58.6 % powder Generic  drug: psyllium Take 1 packet by mouth daily.   metoCLOPramide 5 MG tablet Commonly known as: REGLAN Take 5 mg by mouth daily as needed for vomiting or nausea.   mupirocin cream 2 % Commonly known as: BACTROBAN Apply 1 application topically 2 (two) times daily as needed (wound care).   mupirocin ointment 2 % Commonly known as: BACTROBAN Apply 1 application topically 2 (two) times daily as needed (Rash).    nystatin ointment Commonly known as: MYCOSTATIN 1 application daily as needed (Skin Rash).   nystatin-triamcinolone ointment Commonly known as: MYCOLOG Apply 1 application topically 2 (two) times daily. To groins   Oxycodone HCl 20 MG Tabs Take 4 mg by mouth See admin instructions. Five times a day as needed   pantoprazole 20 MG tablet Commonly known as: PROTONIX Take 20 mg by mouth daily as needed for heartburn or indigestion.   predniSONE 20 MG tablet Commonly known as: DELTASONE Take 20 mg by mouth daily as needed (unknown).   Rhopressa 0.02 % Soln Generic drug: Netarsudil Dimesylate Place 1 drop into the right eye at bedtime.   Rocklatan 0.02-0.005 % Soln Generic drug: Netarsudil-Latanoprost Place 1 drop into both eyes at bedtime.   Spiriva HandiHaler 18 MCG inhalation capsule Generic drug: tiotropium 18 mcg 2 (two) times daily.   tadalafil 5 MG tablet Commonly known as: CIALIS Take 1 tablet (5 mg total) by mouth daily as needed for erectile dysfunction.   tamsulosin 0.4 MG Caps capsule Commonly known as: FLOMAX Take 0.4 mg by mouth daily after breakfast.   terbinafine 250 MG tablet Commonly known as: LAMISIL Take 250 mg by mouth 2 (two) times daily.   timolol 0.5 % ophthalmic solution Commonly known as: TIMOPTIC Place 1 drop into both eyes 2 (two) times daily as needed (eye pressure).   Viagra 100 MG tablet Generic drug: sildenafil Take 1 tablet (100 mg total) by mouth daily as needed for erectile dysfunction.        Follow-up Information     Ashok Pall, MD Follow up in 3 week(s).   Specialty: Neurosurgery Why: please call the office to make or keep your post op appointment Contact information: [place]. [address] [place] [address] [phone]                 Signed: Ashok Pall 03/31/2021, 4:11 PM

## 2021-03-31 NOTE — Op Note (Signed)
03/31/2021  11:06 AM  PATIENT:  Jonathan [name]  81 y.o. male with neurogenic claudication, left lower extremity weakness, and lumbar stenosis at L3/4,4/5.  PRE-OPERATIVE DIAGNOSIS:  Spinal stenosis, Lumbar region without neurogenic claudication  POST-OPERATIVE DIAGNOSIS:  Spinal stenosis, Lumbar region without neurogenic claudication  PROCEDURE:  Procedure(s): Bilateral Lumbar Three- Four, Lumbar Four-Five Laminectomy, Decompression  SURGEON:   Surgeon(s): Ashok Pall, MD Newman Pies, MD  ASSISTANTS:jenkins, Dellis Filbert  ANESTHESIA:   general  EBL:  Total I/O In: 1500 [I.V.:1500] Out: 100 [Blood:100]  BLOOD ADMINISTERED:none  CELL SAVER GIVEN:none used  COUNT:per nursing  DRAINS: none   SPECIMEN:  No Specimen  DICTATION: Jonathan [name] was taken to the operating room, intubated and placed under a general anesthetic without difficulty. He was positioned prone on a Wilson frame with all pressure points padded. His back was prepped and draped in a sterile manner. I opened the skin with a 10 blade and carried the dissection down to the thoracolumbar fascia. I used both sharp dissection and the monopolar cautery to expose the lamina of L3,4, and 5. I confirmed my location with an intraoperative xray.  I used the drill, Kerrison punches, and curettes to perform a semihemilaminectomy of L3, and L4. I used the punches to remove the ligamentum flavum to expose the thecal sac. I started the decompression of the spinal canal, thecal sac and L3,4, and L5 root(s). I inspected the L3,4,and 5 nerve roots and felt they were well decompressed. I explored rostrally, laterally, medially, and caudally and was satisfied with the decompression. We irrigated the wound, then closed in layers. We approximated the thoracolumbar fascia, subcutaneous, and subcuticular planes with vicryl sutures. I used dermabond for a sterile dressing.   PLAN OF CARE: Admit for overnight observation  PATIENT  DISPOSITION:  PACU - hemodynamically stable.   Delay start of Pharmacological VTE agent (>24hrs) due to surgical blood loss or risk of bleeding:  no

## 2021-03-31 NOTE — Discharge Instructions (Signed)
Lumbar Laminectomy Care After A laminectomy is an operation performed on the spine. The purpose is to decompress the spinal cord and/or the nerve roots.  The time in surgery depends on the findings in surgery and what is necessary to correct the problems. HOME CARE INSTRUCTIONS   Check the cut (incision) made by the surgeon twice a day for signs of infection. Some signs of infection may include:   A foul smelling, greenish or yellowish discharge from the wound.   Increased pain.   Increased redness over the incision (operative) site.   The skin edges may separate.   Flu-like symptoms (problems).   A temperature above 101.5 F (38.6 C).   Change your bandages in about 24 to 36 hours following surgery or as directed.   You may shower tomorrow. Avoid bathtubs, swimming pools and hot tubs for three weeks or until your incision has healed completely. If you have stitches or staples, they may be removed 2 to 3 weeks after surgery, or as directed by your doctor. This may be done by your doctor or caregiver.   You may walk as much as you like. No need to exercise at this time. Limit lifting to ~10lbs.  Weight reduction may be beneficial if you are overweight.   Daily exercise is helpful to prevent the return of problems. Walking is permitted. You may use a treadmill without an incline. Cut down on activities and exercise if you have discomfort. You may also go up and down stairs as much as you can tolerate.   DO NOT lift anything heavier than 10 . Avoid bending or twisting at the waist. Always bend your knees when lifting.   Maintain strength and range of motion as instructed.   Do not drive for 2 to 3 weeks, or as directed by your doctors. You may be a passenger for 20 to 30 minute trips. Lying back in the passenger seat may be more comfortable for you. Always wear a seatbelt.   Limit your sitting in a regular chair to 20 to 30 minutes at a time. There are no limitations for sitting in a  recliner. You should lie down or walk in between sitting periods.   Only take over-the-counter or prescription medicines for pain, discomfort, or fever as directed by your caregiver.  SEEK MEDICAL CARE IF:   There is increased bleeding (more than a small spot) from the wound.   You notice redness, swelling, or increasing pain in the wound.   Pus is coming from wound.   You develop an unexplained oral temperature above 102 F (38.9 C) develops.   You notice a foul smell coming from the wound or dressing.   You have increasing pain in your wound.  SEEK IMMEDIATE MEDICAL CARE IF:   You develop a rash.   You have difficulty breathing.   You develop any allergic problems to medicines given.   

## 2021-03-31 NOTE — Plan of Care (Signed)
Problem: Safety: °Goal: Ability to remain free from injury will improve °Outcome: Completed/Met °  °Problem: Education: °Goal: Ability to verbalize activity precautions or restrictions will improve °Outcome: Completed/Met °Goal: Knowledge of the prescribed therapeutic regimen will improve °Outcome: Completed/Met °Goal: Understanding of discharge needs will improve °Outcome: Completed/Met °  °Problem: Activity: °Goal: Ability to avoid complications of mobility impairment will improve °Outcome: Completed/Met °Goal: Ability to tolerate increased activity will improve °Outcome: Completed/Met °Goal: Will remain free from falls °Outcome: Completed/Met °  °Problem: Bowel/Gastric: °Goal: Gastrointestinal status for postoperative course will improve °Outcome: Completed/Met °  °Problem: Clinical Measurements: °Goal: Ability to maintain clinical measurements within normal limits will improve °Outcome: Completed/Met °Goal: Postoperative complications will be avoided or minimized °Outcome: Completed/Met °Goal: Diagnostic test results will improve °Outcome: Completed/Met °  °Problem: Pain Management: °Goal: Pain level will decrease °Outcome: Completed/Met °  °Problem: Skin Integrity: °Goal: Will show signs of wound healing °Outcome: Completed/Met °  °Problem: Health Behavior/Discharge Planning: °Goal: Identification of resources available to assist in meeting health care needs will improve °Outcome: Completed/Met °  °Problem: Bladder/Genitourinary: °Goal: Urinary functional status for postoperative course will improve °Outcome: Completed/Met °  °

## 2021-03-31 NOTE — Evaluation (Signed)
Physical Therapy Evaluation Patient Details Name: Jonathan [name] MRN: [id number] DOB: [birth year] Today's Date: 03/31/2021  History of Present Illness  81 yo male s/p bilat L3-5 laminectomy, decompression on 1/6. PMH includes COPD, depression, PTSD, DMII with neuropathy, GERD, gout, HTN, OP,  Clinical Impression   Pt presents with mild post-operative pain, history of balance impairment, decreased knowledge/application of spinal precautions, and decreased overall activity tolerance. Pt to benefit from acute PT to address deficits. Pt ambulated hallway distance with use of RW, PT cuing pt for safety and following back precautions throughout mobility. PT encouraged pt to have wife supervise all mobility once home, pt expresses understanding. Pt with no further questions, appropriate to d/c from acute setting from PT standpoint.       Recommendations for follow up therapy are one component of a multi-disciplinary discharge planning process, led by the attending physician.  Recommendations may be updated based on patient status, additional functional criteria and insurance authorization.  Follow Up Recommendations Follow physician's recommendations for discharge plan and follow up therapies    Assistance Recommended at Discharge Intermittent Supervision/Assistance  Patient can return home with the following  Assist for transportation;A little help with bathing/dressing/bathroom    Equipment Recommendations Other (comment) (PT recommended front-wheeled walker post-operatively for safety, pt declines and wishes to use his rollator)  Recommendations for Other Services       Functional Status Assessment Patient has not had a recent decline in their functional status     Precautions / Restrictions Precautions Precautions: Fall;Back Precaution Booklet Issued: Yes (comment) Precaution Comments: BLT rules reviewed, log roll in/out of bed Required Braces or Orthoses: Other Brace Other Brace: no  brace needed, per pt he will use a lumbar corset that he has at home Restrictions Weight Bearing Restrictions: No      Mobility  Bed Mobility Overal bed mobility: Needs Assistance Bed Mobility: Rolling;Sidelying to Sit Rolling: Supervision Sidelying to sit: Supervision       General bed mobility comments: cues for log roll technique, increased time    Transfers Overall transfer level: Needs assistance Equipment used: Rolling walker (2 wheels) Transfers: Sit to/from Stand Sit to Stand: Supervision;From elevated surface           General transfer comment: for safety, cues for hand placement when rising/sitting.    Ambulation/Gait Ambulation/Gait assistance: Supervision Gait Distance (Feet): 160 Feet Assistive device: Rolling walker (2 wheels) Gait Pattern/deviations: Step-through pattern;Decreased stride length;Trunk flexed Gait velocity: decr     General Gait Details: cues for upright posture, placement in RW.  Stairs            Wheelchair Mobility    Modified Rankin (Stroke Patients Only)       Balance Overall balance assessment: Needs assistance;History of Falls Sitting-balance support: No upper extremity supported Sitting balance-Leahy Scale: Good     Standing balance support: Bilateral upper extremity supported;During functional activity Standing balance-Leahy Scale: Fair Standing balance comment: able to stand statically without UE support, reliant on RW dynamically                             Pertinent Vitals/Pain Pain Assessment: Faces Faces Pain Scale: Hurts a little bit Pain Location: incision Pain Descriptors / Indicators: Discomfort;Sore Pain Intervention(s): Limited activity within patient's tolerance;Monitored during session;Repositioned    Home Living Family/patient expects to be discharged to:: Private residence Living Arrangements: Spouse/significant other Available Help at Discharge: Family;Available 24  hours/day Type of Home: House  Home Access: Ramped entrance       Home Layout: One level Home Equipment: Rollator (4 wheels);Shower seat;Grab bars - tub/shower;BSC/3in1;Grab bars - toilet;Electric scooter;Cane - single point;Cane - quad      Prior Function Prior Level of Function : Needs assist       Physical Assist : Mobility (physical) Mobility (physical): Gait   Mobility Comments: uses cane or walker for mobility PTA, states he has an upright walker (rollator with elevated armrests) but this is not assembled yet ADLs Comments: Pt reports he has assist from wife with bathing, cooking     Hand Dominance   Dominant Hand: Right    Extremity/Trunk Assessment   Upper Extremity Assessment Upper Extremity Assessment: Defer to OT evaluation    Lower Extremity Assessment Lower Extremity Assessment: Generalized weakness;RLE deficits/detail;LLE deficits/detail RLE Sensation: history of peripheral neuropathy LLE Sensation: history of peripheral neuropathy    Cervical / Trunk Assessment Cervical / Trunk Assessment: Back Surgery  Communication   Communication: No difficulties  Cognition Arousal/Alertness: Awake/alert Behavior During Therapy: WFL for tasks assessed/performed Overall Cognitive Status: Impaired/Different from baseline Area of Impairment: Safety/judgement                         Safety/Judgement: Decreased awareness of safety;Decreased awareness of deficits     General Comments: pt lacks insight into safety, requires cues for safe mobility        General Comments      Exercises Other Exercises Other Exercises: home walking: PT encouraged pt to be up and mobilizing with wife's supervision 1x/hour during waking hours to promote circulation, prevent stiffness post-op, and maintain strength and endurance   Assessment/Plan    PT Assessment Patient does not need any further PT services  PT Problem List         PT Treatment Interventions       PT Goals (Current goals can be found in the Care Plan section)  Acute Rehab PT Goals PT Goal Formulation: With patient Time For Goal Achievement: 03/31/21 Potential to Achieve Goals: Good    Frequency       Co-evaluation               AM-PAC PT "6 Clicks" Mobility  Outcome Measure Help needed turning from your back to your side while in a flat bed without using bedrails?: None Help needed moving from lying on your back to sitting on the side of a flat bed without using bedrails?: None Help needed moving to and from a bed to a chair (including a wheelchair)?: None Help needed standing up from a chair using your arms (e.g., wheelchair or bedside chair)?: None Help needed to walk in hospital room?: A Little Help needed climbing 3-5 steps with a railing? : A Little 6 Click Score: 22    End of Session   Activity Tolerance: Patient tolerated treatment well Patient left: with call bell/phone within reach;in bed Nurse Communication: Mobility status;Other (comment) (notified NT and RN that pt is requesting pt's assistance with dressing) PT Visit Diagnosis: Other abnormalities of gait and mobility (R26.89)    Time: 6945-0388 PT Time Calculation (min) (ACUTE ONLY): 26 min   Charges:   PT Evaluation $PT Eval Low Complexity: 1 Low PT Treatments $Gait Training: 8-22 mins       Stacie Glaze, PT DPT Acute Rehabilitation Services Pager [phone]  Office [phone]   Roxine Caddy E Ruffin Pyo 03/31/2021, 5:11 PM

## 2021-03-31 NOTE — H&P (Signed)
BP (!) 167/91    Pulse 75    Temp 98 F (36.7 C) (Oral)    Resp 17    Ht 6\' 8"  (2.032 m)    Wt 117.5 kg    SpO2 100%    BMI 28.46 kg/m     Mr. Kavanagh comes in today, weighs 265 pounds.  Temperature is 98.6 blood pressure is 115/71, pulse 91, pain is 9/10.  Pain is in the right lower extremity.  He does have what I believe to be a herniated disc on the right side L4-5. He would like to proceed with a discectomy at L4-5 and decompression.  If I do not need to take the disc, I will not.  He does not need hardware.  He does not need anything else done.  Risks and benefits, I have explained.  He understands and wishes to proceed.  Risks and benefits including bleeding, infection, no relief, need for further surgery, damage to the nerve roots, difficulty with urination and/or bowel control.    Allergies  Allergen Reactions   Ampicillin Other (See Comments)    Don't work   Ciprofloxacin Other (See Comments)    Doesn't work   Levaquin [Levofloxacin] Other (See Comments)    Doesn't work   Past Medical History:  Diagnosis Date   Acquired bladder diverticulum    12/ 2012  resection diverticulum done at [facility] in [place], [place]   Age-related cataract of right eye    scheduled for catarat extraction 09-12-2017   Agent orange exposure    Aneurysm of infrarenal abdominal aorta    first dx 2017--- last CT 06-11-2017  measures 3.5cm   Anxiety    Chronic constipation    COPD with emphysema ([place])    06--12-2017  per pt no symptoms since Feb 2019   Depression    PTSD   Diabetes mellitus type 2, diet-controlled ([place])    Diabetic neuropathy ([place])    Dyslipidemia    Dyspnea on exertion    Erectile dysfunction    Feeling of incomplete bladder emptying    Gait abnormality 01/06/2020   GERD (gastroesophageal reflux disease)    Gout    09-02-2017 last flare up 3 months ago   Hiatal hernia    History of atrial fibrillation    episode post op right lung lobectomy 07-04-2015   History of bladder stone     History of colon polyps    History of DVT of lower extremity    03/ 2019  bilateral lower extremity (superficial) ---  per treated w/ oral medication    History of gastritis    History of kidney stones    History of radiation therapy 09-14-2015  to 09-21-2015   left upper lung nodule -- 54Gy in 3 fractions (18 Gy per fraction)   Hyperplasia of prostate with lower urinary tract symptoms (LUTS)    Hypertension    Lumbar spinal stenosis    Nephrolithiasis    Neurogenic bladder    Osteoarthritis    ankle, hands   Osteoporosis    Prostate cancer [facility]) urologist-  dr wrenn/  oncologist-  dr Tammi Klippel   dx 05-24-2017-- Stage T2b,  Gleason 4+4,  PSA 22.41,  vol 38cc--- started ADT 04/ 2019,  plan external radiation therapy   Radiation fibrosis of lung ([place])    hx left upper long nodule SBRT 06/ 2017   Squamous cell carcinoma of both lungs ([place]) last CT in epic dated 06-26-2017 no recurrence   dx 03/ 2017  non-small cell SCC via bronchoscopy w/ bx's by dr Roxan Hockey---  s/p  right VATS w/ right lobectomy and node dissection's 07-04-2015 /  pt had SBRT to left upper nodule Stage 1 ([facility]) completed 09-21-2015   Vertigo 2018   Wears dentures    bottom   Wears glasses    Past Surgical History:  Procedure Laterality Date   BIOPSY  05/29/2019   Procedure: BIOPSY;  Surgeon: Rogene Houston, MD;  [facility];  Service: Endoscopy;;  gastric ulcer   CARPAL TUNNEL RELEASE Right 07/09/2014   Procedure: RIGHT OPEN CARPAL TUNNEL RELEASE;  Surgeon: Mcarthur Rossetti, MD;  [facility];  Service: Orthopedics;  Laterality: Right;   CHOLECYSTECTOMY N/A 02/24/2014   Procedure: LAPAROSCOPIC CHOLECYSTECTOMY;  Surgeon: Coralie Keens, MD;  Location: [place];  Service: General;  Laterality: N/A;   COLONOSCOPY     CYSTOLITHOTOMY  11/ 2007      [facility] [place] 04/18/2018   Procedure: CYSTOSCOPY FLEXIBLE;  Surgeon: Irine Seal, MD;  [facility]: [facility];  Service: Urology;  Laterality: N/A;   CYSTOSCOPY W/ LITHOLAPAXY / EHL  02-24-2007   dr Janice Norrie  [facility]   dental implant     No teeth at this time waiting for them to be made as of 01-30-19   ESOPHAGOGASTRODUODENOSCOPY (EGD) WITH PROPOFOL N/A 05/29/2019   Procedure: ESOPHAGOGASTRODUODENOSCOPY (EGD) WITH PROPOFOL;  Surgeon: Rogene Houston, MD;  [facility];  Service: Endoscopy;  Laterality: N/A;  [place] IMPLANT N/A 09/05/2017   Procedure: GOLD SEED IMPLANT;  Surgeon: Irine Seal, MD;  Location[facility];  Service: Urology;  Laterality: N/A;   INSERTION OF SUPRAPUBIC CATHETER N/A 04/18/2018   Procedure: SUPRAPUBIC TUBE CHANGE;  Surgeon: Irine Seal, MD;  [facility];  Service: Urology;  Laterality: N/A;   IR CATHETER TUBE CHANGE  02/24/2018   SPACE OAR INSTILLATION N/A 09/05/2017   Procedure: SPACE OAR INSTILLATION;  Surgeon: Irine Seal, MD;  Location: [facility];  Service: Urology;  Laterality: N/A;   TOTAL ANKLE ARTHROPLASTY Right 04/20/2015   Procedure: TOTAL ANKLE ARTHOPLASTY;  Surgeon: Newt Minion, MD;  Location: [place];  Service: Orthopedics;  Laterality: Right;   TRANSTHORACIC ECHOCARDIOGRAM  11/16/2012   ef 55-60%,  grade 1 diastolic dysfunction/  mild dilated ascending aorta/  mild MR/ trivial TR   TRANSURETHRAL RESECTION OF PROSTATE N/A 02/03/2019   Procedure: TRANSURETHRAL RESECTION OF THE PROSTATE (TURP);  Surgeon: Irine Seal, MD;  [facility];  Service: Urology;  Laterality: N/A;   VIDEO ASSISTED THORACOSCOPY (VATS)/ LOBECTOMY Right 07/04/2015   Procedure: VIDEO ASSISTED THORACOSCOPY (VATS)/ RIGHT UPPER LOBECTOMY;  Surgeon: Melrose Nakayama, MD;  Location: [place];  Service: Thoracic;  Laterality: Right;   VIDEO BRONCHOSCOPY N/A 06/17/2015   Procedure: VIDEO BRONCHOSCOPY;  Surgeon: Melrose Nakayama, MD;  Location: [place];  Service: Thoracic;  Laterality: N/A;   VIDEO BRONCHOSCOPY WITH ENDOBRONCHIAL NAVIGATION N/A  06/17/2015   Procedure: VIDEO BRONCHOSCOPY WITH ENDOBRONCHIAL NAVIGATION;  Surgeon: Melrose Nakayama, MD;  Location: [place];  Service: Thoracic;  Laterality: N/A;   VIDEO BRONCHOSCOPY WITH ENDOBRONCHIAL ULTRASOUND N/A 06/17/2015   Procedure: VIDEO BRONCHOSCOPY WITH ENDOBRONCHIAL ULTRASOUND;  Surgeon: Melrose Nakayama, MD;  Location: [place];  Service: Thoracic;  Laterality: N/A;   Prior to Admission medications   Medication Sig Start Date End Date Taking? Authorizing Provider  albuterol (PROVENTIL) (2.5 MG/3ML) 0.083% nebulizer  solution Take 2.5 mg by nebulization every 6 (six) hours as needed for shortness of breath.   Yes [provider]  atenolol-chlorthalidone (TENORETIC) 50-25 MG tablet Take 1 tablet by mouth at bedtime.  12/03/18  Yes [provider]  capsaicin (ZOSTRIX) 0.025 % cream Apply 1 application topically daily as needed (Skin Rash). 02/09/20  Yes [provider]  diclofenac Sodium (VOLTAREN) 1 % GEL Apply 2 g topically 4 (four) times daily as needed (pain).   Yes [provider]  donepezil (ARICEPT) 5 MG tablet Take 5 mg by mouth daily. 01/02/21  Yes [provider]  escitalopram (LEXAPRO) 5 MG tablet Take 5 mg by mouth daily.   Yes [provider]  gabapentin (NEURONTIN) 300 MG capsule Take 1 capsule (300 mg total) by mouth 2 (two) times daily. Patient taking differently: Take 800 mg by mouth 2 (two) times daily. 05/26/20  Yes Kathrynn Ducking, MD  LANTUS SOLOSTAR 100 UNIT/ML Solostar Pen Inject 2.5 Units into the skin at bedtime. 11/28/17  Yes [provider]  latanoprost (XALATAN) 0.005 % ophthalmic solution Place 1 drop into both eyes at bedtime. 01/26/21  Yes [provider]  LINZESS 72 MCG capsule Take 72 mcg by mouth daily before breakfast. 05/21/19  Yes [provider]  meclizine (ANTIVERT) 12.5 MG tablet Take 6.25-12.5 mg by mouth every 6 (six) hours as needed for dizziness. 02/16/21  Yes [provider]  metoCLOPramide (REGLAN) 5 MG tablet Take 5 mg by mouth daily as needed for vomiting or nausea.   Yes [provider]  mupirocin cream (BACTROBAN) 2 % Apply 1 application topically 2 (two) times daily as needed (wound care).   Yes [provider]  mupirocin ointment (BACTROBAN) 2 % Apply 1 application topically 2 (two) times daily as needed (Rash). 09/10/19  Yes [provider]  nystatin ointment (MYCOSTATIN) 1 application daily as needed (Skin Rash). 09/03/19  Yes [provider]  nystatin-triamcinolone ointment (MYCOLOG) Apply 1 application topically 2 (two) times daily. To groins 09/11/19  Yes Irine Seal, MD  Oxycodone HCl 20 MG TABS Take 4 mg by mouth See admin instructions. Five times a day as needed 02/02/18  Yes [provider]  pantoprazole (PROTONIX) 20 MG tablet Take 20 mg by mouth daily as needed for heartburn or indigestion.   Yes [provider]  RHOPRESSA 0.02 % SOLN Place 1 drop into the right eye at bedtime. 01/26/21  Yes [provider]  ROCKLATAN 0.02-0.005 % SOLN Place 1 drop into both eyes at bedtime. 02/22/21  Yes [provider]  SPIRIVA HANDIHALER 18 MCG inhalation capsule 18 mcg 2 (two) times daily. 03/16/20  Yes [provider]  tamsulosin (FLOMAX) 0.4 MG CAPS capsule Take 0.4 mg by mouth daily after breakfast.   Yes [provider]  terbinafine (LAMISIL) 250 MG tablet Take 250 mg by mouth 2 (two) times daily. 09/03/19  Yes [provider]  timolol (TIMOPTIC) 0.5 % ophthalmic solution Place 1 drop into both eyes 2 (two) times daily as needed (eye pressure).   Yes [provider]  alprostadil (EDEX) 20 MCG injection 20 mcg by Intracavitary route as needed for erectile dysfunction. use no more than 3 times per week Patient not taking: Reported on 03/22/2021 09/08/20   Irine Seal, MD  alprostadil (PROSTIN VR) 500 MCG/ML injection Alprostadil to 42mcg/ml, inject  74ml intracavernosal prn no more than once daily, 2-3x weekly.  Disp 5 - prefilled 1 ml syringe or a  30ml vial with a box of insulin syringes. Patient not taking: Reported on 03/22/2021 09/08/20   Irine Seal, MD  FREESTYLE LITE test strip  09/02/19   [provider]  hydroquinone 4 % cream Apply 1 application topically daily as needed (Skin Rash).    [provider]  ibuprofen (ADVIL) 600 MG tablet Take 600 mg by mouth every 6 (six) hours as needed for headache. 05/26/19   [provider]  Incontinence Supply Disposable (FITTED BRIEFS MAXIMUM XL) MISC Use briefs as needed daily for incontinence 06/05/19   Irine Seal, MD  Lancets (FREESTYLE) lancets  09/02/19   [provider]  predniSONE (DELTASONE) 20 MG tablet Take 20 mg by mouth daily as needed (unknown).    [provider]  psyllium (METAMUCIL SMOOTH TEXTURE) 58.6 % powder Take 1 packet by mouth daily. Patient not taking: Reported on 03/22/2021 04/27/19   Rogene Houston, MD  tadalafil (CIALIS) 5 MG tablet Take 1 tablet (5 mg total) by mouth daily as needed for erectile dysfunction. 05/05/20   Irine Seal, MD  VIAGRA 100 MG tablet Take 1 tablet (100 mg total) by mouth daily as needed for erectile dysfunction. 08/18/20   Irine Seal, MD  Vibegron (GEMTESA) 75 MG TABS Take 1 tablet by mouth daily. Patient not taking: Reported on 03/22/2021 12/22/20   Irine Seal, MD   Family History  Problem Relation Age of Onset   Cancer Father        Asbestos   Colon cancer Neg Hx    Colon polyps Neg Hx    Kidney disease Neg Hx    Esophageal cancer Neg Hx    Gallbladder disease Neg Hx    Heart disease Neg Hx    Diabetes Neg Hx    Social History   Socioeconomic History   Marital status: Married    Spouse name: Jerl Santos   Number of children: 2   Years of education: college   Highest education level: Not on file  Occupational History   Occupation: retired  Tobacco Use   Smoking status: Former    Packs/day:  0.50    Years: 40.00    Pack years: 20.00    Types: Cigarettes    Quit date: 02/13/2015    Years since quitting: 6.1   Smokeless tobacco: Never  Vaping Use   Vaping Use: Never used  Substance and Sexual Activity   Alcohol use: No    Alcohol/week: 0.0 standard drinks   Drug use: No   Sexual activity: Not Currently  Other Topics Concern   Not on file  Social History Narrative   Lives with wife, daughter and son in law temporarily to help them out   Right Handed   Drinks caffeine occassionally   Social Determinants of Health   Financial Resource Strain: Not on file  Food Insecurity: Not on file  Transportation Needs: Not on file  Physical Activity: Not on file  Stress: Not on file  Social Connections: Not on file  Intimate Partner Violence: Not on file   Physical Exam Constitutional:      Appearance: Normal appearance. He is normal weight.  HENT:     Head: Normocephalic and atraumatic.     Nose: Nose normal.     Mouth/Throat:     Mouth: Mucous membranes are moist.     Pharynx: Oropharynx is clear.  Eyes:     Extraocular Movements: Extraocular movements intact.     Pupils: Pupils are equal, round, and reactive  to light.  Cardiovascular:     Rate and Rhythm: Normal rate.  Pulmonary:     Effort: Pulmonary effort is normal.     Breath sounds: Normal breath sounds.  Abdominal:     General: Abdomen is flat.     Palpations: Abdomen is soft.  Musculoskeletal:     Cervical back: Normal range of motion and neck supple.  Skin:    General: Skin is warm and dry.  Neurological:     Mental Status: He is alert and oriented to person, place, and time.     Motor: Weakness present.     Gait: Gait abnormal.  Psychiatric:        Mood and Affect: Mood normal.        Behavior: Behavior normal.        Thought Content: Thought content normal.        Judgment: Judgment normal.

## 2021-03-31 NOTE — Transfer of Care (Signed)
Immediate Anesthesia Transfer of Care Note  Patient: Jonathan [name]  Procedure(s) Performed: Bilateral Lumbar Three- Four, Lumbar Four-Five Laminectomy, Decompression (Right)  Patient Location: PACU  Anesthesia Type:General  Level of Consciousness: awake and alert   Airway & Oxygen Therapy: Patient Spontanous Breathing and Patient connected to nasal cannula oxygen  Post-op Assessment: Report given to RN and Post -op Vital signs reviewed and stable  Post vital signs: Reviewed and stable  Last Vitals:  Vitals Value Taken Time  BP 162/91 03/31/21 1103  Temp    Pulse 65 03/31/21 1104  Resp 10 03/31/21 1104  SpO2 100 % 03/31/21 1104  Vitals shown include unvalidated device data.  Last Pain:  Vitals:   03/31/21 0713  TempSrc:   PainSc: 7          Complications: No notable events documented.

## 2021-03-31 NOTE — Anesthesia Procedure Notes (Signed)
Procedure Name: Intubation Date/Time: 03/31/2021 8:35 AM Performed by: Erick Colace, CRNA Pre-anesthesia Checklist: Patient identified, Emergency Drugs available, Suction available and Patient being monitored Patient Re-evaluated:Patient Re-evaluated prior to induction Oxygen Delivery Method: Circle system utilized Preoxygenation: Pre-oxygenation with 100% oxygen Induction Type: IV induction Ventilation: Mask ventilation without difficulty Laryngoscope Size: Mac and 4 Grade View: Grade I Tube type: Oral Tube size: 8.5 mm Number of attempts: 1 Airway Equipment and Method: Stylet and Oral airway Placement Confirmation: ETT inserted through vocal cords under direct vision, positive ETCO2 and breath sounds checked- equal and bilateral Secured at: 23 cm Tube secured with: Tape Dental Injury: Teeth and Oropharynx as per pre-operative assessment

## 2021-03-31 NOTE — Evaluation (Signed)
Occupational Therapy Evaluation Patient Details Name: Jonathan [name] MRN: [id number] DOB: 1940-06-22 Today's Date: 03/31/2021   History of Present Illness 81 yo male s/p bilat L3-5 laminectomy, decompression on 1/6. PMH includes COPD, depression, PTSD, DMII with neuropathy, GERD, gout, HTN, OP,   Clinical Impression   PTA, pt was living with his wife and was independent with BADLs; using SPC. Currently, pt requires Supervision for ADLs and functional mobility using RW. Provided education and handout on back precautions, grooming, bed mobility, LB ADLs, toileting, and shower transfer with shower seat; pt demonstrated understanding. Answered all pt questions. Recommend dc home once medically stable per physician. All acute OT needs met and will sign off. Thank you.      Recommendations for follow up therapy are one component of a multi-disciplinary discharge planning process, led by the attending physician.  Recommendations may be updated based on patient status, additional functional criteria and insurance authorization.   Follow Up Recommendations  No OT follow up    Assistance Recommended at Discharge Intermittent Supervision/Assistance  Patient can return home with the following Assistance with cooking/housework    Functional Status Assessment  Patient has had a recent decline in their functional status and demonstrates the ability to make significant improvements in function in a reasonable and predictable amount of time.  Equipment Recommendations  None recommended by OT    Recommendations for Other Services       Precautions / Restrictions Precautions Precautions: Fall;Back Precaution Booklet Issued: Yes (comment) Precaution Comments: BLT rules reviewed, log roll in/out of bed Required Braces or Orthoses: Other Brace Other Brace: no brace needed, per pt he will use a lumbar corset that he has at home Restrictions Weight Bearing Restrictions: No      Mobility Bed  Mobility Overal bed mobility: Needs Assistance Bed Mobility: Rolling;Sidelying to Sit Rolling: Supervision Sidelying to sit: Supervision       General bed mobility comments: Sitting at EOB. Reviwed log roll    Transfers Overall transfer level: Needs assistance Equipment used: None Transfers: Sit to/from Stand Sit to Stand: Supervision           General transfer comment: for safety, cues for hand placement when rising/sitting.      Balance Overall balance assessment: Needs assistance;History of Falls Sitting-balance support: No upper extremity supported Sitting balance-Leahy Scale: Good     Standing balance support: Bilateral upper extremity supported;During functional activity Standing balance-Leahy Scale: Fair Standing balance comment: able to stand statically without UE support, reliant on RW dynamically                           ADL either performed or assessed with clinical judgement   ADL                                         General ADL Comments: Pt performing ADLs at Supervision level. Providing education on comepsnatory tehcniques for grooming, bed mobility, UB ADLs, LB ADLs, and toileting.     Vision Baseline Vision/History: 1 Wears glasses Patient Visual Report: No change from baseline       Perception     Praxis      Pertinent Vitals/Pain Pain Assessment: Faces Faces Pain Scale: Hurts a little bit Pain Location: incision Pain Descriptors / Indicators: Discomfort;Sore Pain Intervention(s): Monitored during session;Limited activity within patient's tolerance;Repositioned     Hand  Dominance Right   Extremity/Trunk Assessment Upper Extremity Assessment Upper Extremity Assessment: Overall WFL for tasks assessed   Lower Extremity Assessment Lower Extremity Assessment: Defer to PT evaluation RLE Sensation: history of peripheral neuropathy LLE Sensation: history of peripheral neuropathy   Cervical / Trunk  Assessment Cervical / Trunk Assessment: Back Surgery   Communication Communication Communication: No difficulties   Cognition Arousal/Alertness: Awake/alert Behavior During Therapy: WFL for tasks assessed/performed Overall Cognitive Status: Impaired/Different from baseline Area of Impairment: Safety/judgement                         Safety/Judgement: Decreased awareness of safety;Decreased awareness of deficits     General Comments: Decreased awareness for safety. However, maintaining BLT during ADLs     General Comments       Exercises     Shoulder Instructions      Home Living Family/patient expects to be discharged to:: Private residence Living Arrangements: Spouse/significant other Available Help at Discharge: Family;Available 24 hours/day Type of Home: House Home Access: Ramped entrance     Home Layout: One level     Bathroom Shower/Tub: Occupational psychologist: Handicapped height     Home Equipment: Rollator (4 wheels);Shower seat;Grab bars - tub/shower;BSC/3in1;Grab bars - toilet;Electric scooter;Cane - single point;Cane - quad          Prior Functioning/Environment Prior Level of Function : Needs assist       Physical Assist : Mobility (physical) Mobility (physical): Gait   Mobility Comments: uses cane or walker for mobility PTA, states he has an upright walker (rollator with elevated armrests) but this is not assembled yet ADLs Comments: Pt reports he has assist from wife with bathing, cooking        OT Problem List: Decreased strength;Decreased range of motion;Decreased activity tolerance      OT Treatment/Interventions:      OT Goals(Current goals can be found in the care plan section) Acute Rehab OT Goals Patient Stated Goal: Go home OT Goal Formulation: All assessment and education complete, DC therapy  OT Frequency:      Co-evaluation              AM-PAC OT "6 Clicks" Daily Activity     Outcome Measure  Help from another person eating meals?: None Help from another person taking care of personal grooming?: None Help from another person toileting, which includes using toliet, bedpan, or urinal?: A Little Help from another person bathing (including washing, rinsing, drying)?: A Little Help from another person to put on and taking off regular upper body clothing?: None Help from another person to put on and taking off regular lower body clothing?: A Little 6 Click Score: 21   End of Session Nurse Communication: Mobility status  Activity Tolerance: Patient tolerated treatment well Patient left: in bed;with call bell/phone within reach  OT Visit Diagnosis: Unsteadiness on feet (R26.81);Other abnormalities of gait and mobility (R26.89);Muscle weakness (generalized) (M62.81)                Time: 6803-2122 OT Time Calculation (min): 15 min Charges:  OT General Charges $OT Visit: 1 Visit OT Evaluation $OT Eval Low Complexity: [place], OTR/L Acute Rehab Pager: [phone] Office: [place] 03/31/2021, 5:27 PM

## 2021-04-03 ENCOUNTER — Encounter (HOSPITAL_COMMUNITY): Payer: Self-pay | Admitting: Neurosurgery

## 2021-04-05 ENCOUNTER — Telehealth: Payer: Self-pay | Admitting: Orthopedic Surgery

## 2021-04-05 NOTE — Telephone Encounter (Signed)
Err

## 2021-04-06 ENCOUNTER — Emergency Department (HOSPITAL_COMMUNITY): Payer: Medicare Other

## 2021-04-06 ENCOUNTER — Encounter (HOSPITAL_COMMUNITY): Payer: Self-pay | Admitting: *Deleted

## 2021-04-06 ENCOUNTER — Emergency Department (HOSPITAL_COMMUNITY)
Admission: EM | Admit: 2021-04-06 | Discharge: 2021-04-06 | Disposition: A | Discharge status: Home / Self Care | Payer: Medicare Other | Attending: Emergency Medicine | Admitting: Emergency Medicine

## 2021-04-06 DIAGNOSIS — E1140 Type 2 diabetes mellitus with diabetic neuropathy, unspecified: Secondary | ICD-10-CM | POA: Diagnosis not present

## 2021-04-06 DIAGNOSIS — M79606 Pain in leg, unspecified: Secondary | ICD-10-CM | POA: Diagnosis not present

## 2021-04-06 DIAGNOSIS — R600 Localized edema: Secondary | ICD-10-CM | POA: Diagnosis not present

## 2021-04-06 DIAGNOSIS — R0602 Shortness of breath: Secondary | ICD-10-CM | POA: Diagnosis not present

## 2021-04-06 DIAGNOSIS — M7989 Other specified soft tissue disorders: Secondary | ICD-10-CM | POA: Diagnosis not present

## 2021-04-06 DIAGNOSIS — R2243 Localized swelling, mass and lump, lower limb, bilateral: Secondary | ICD-10-CM | POA: Diagnosis not present

## 2021-04-06 DIAGNOSIS — I10 Essential (primary) hypertension: Secondary | ICD-10-CM | POA: Insufficient documentation

## 2021-04-06 DIAGNOSIS — Z7984 Long term (current) use of oral hypoglycemic drugs: Secondary | ICD-10-CM | POA: Diagnosis not present

## 2021-04-06 DIAGNOSIS — G8918 Other acute postprocedural pain: Secondary | ICD-10-CM | POA: Diagnosis not present

## 2021-04-06 LAB — CBC WITH DIFFERENTIAL/PLATELET
Abs Immature Granulocytes: 0.03 10*3/uL (ref 0.00–0.07)
Basophils Absolute: 0 10*3/uL (ref 0.0–0.1)
Basophils Relative: 0 %
Eosinophils Absolute: 0.3 10*3/uL (ref 0.0–0.5)
Eosinophils Relative: 4 %
HCT: 37.7 % — ABNORMAL LOW (ref 39.0–52.0)
Hemoglobin: 12.3 g/dL — ABNORMAL LOW (ref 13.0–17.0)
Immature Granulocytes: 0 %
Lymphocytes Relative: 18 %
Lymphs Abs: 1.4 10*3/uL (ref 0.7–4.0)
MCH: 31.3 pg (ref 26.0–34.0)
MCHC: 32.6 g/dL (ref 30.0–36.0)
MCV: 95.9 fL (ref 80.0–100.0)
Monocytes Absolute: 0.5 10*3/uL (ref 0.1–1.0)
Monocytes Relative: 7 %
Neutro Abs: 5.1 10*3/uL (ref 1.7–7.7)
Neutrophils Relative %: 71 %
Platelets: 253 10*3/uL (ref 150–400)
RBC: 3.93 MIL/uL — ABNORMAL LOW (ref 4.22–5.81)
RDW: 13.9 % (ref 11.5–15.5)
WBC: 7.3 10*3/uL (ref 4.0–10.5)
nRBC: 0 % (ref 0.0–0.2)

## 2021-04-06 LAB — BASIC METABOLIC PANEL
Anion gap: 8 (ref 5–15)
BUN: 16 mg/dL (ref 8–23)
CO2: 27 mmol/L (ref 22–32)
Calcium: 8.3 mg/dL — ABNORMAL LOW (ref 8.9–10.3)
Chloride: 101 mmol/L (ref 98–111)
Creatinine, Ser: 1.09 mg/dL (ref 0.61–1.24)
GFR, Estimated: >60 mL/min (ref >60)
Glucose, Bld: 114 mg/dL — ABNORMAL HIGH (ref 70–99)
Potassium: 3.4 mmol/L — ABNORMAL LOW (ref 3.5–5.1)
Sodium: 136 mmol/L (ref 135–145)

## 2021-04-06 MED ORDER — HYDROCODONE-ACETAMINOPHEN 5-325 MG PO TABS
2.0000 | ORAL_TABLET | Freq: Once | ORAL | Status: AC
  Administered 2021-04-06: 2 via ORAL
  Filled 2021-04-06: qty 2

## 2021-04-06 MED ORDER — FUROSEMIDE 20 MG PO TABS: 20.0000 mg | ORAL_TABLET | Freq: Every day | ORAL | 0 refills | Status: DC

## 2021-04-06 MED ORDER — ALBUTEROL SULFATE (2.5 MG/3ML) 0.083% IN NEBU: 2.5000 mg | INHALATION_SOLUTION | Freq: Four times a day (QID) | RESPIRATORY_TRACT | 1 refills | Status: DC | PRN

## 2021-04-06 NOTE — ED Triage Notes (Signed)
C/o shortness of breath and pain in both legs, recently had back surgery

## 2021-04-06 NOTE — Discharge Instructions (Addendum)
I would like for you to return to the emergency department for severe pain swelling inability to move your legs, severe back pain, difficulty urinating or having a bowel movement or any severe or worsening symptoms.  I did speak with your surgeon Dr. Christella Noa who would like for you to go to the office at the next available appointment, please call the office first thing in the morning.  Your blood work and testing including your xray showed no pneumonia, no blood clots.  Please take your albuterol inhaler 2 puffs every 4 hours as needed for shortness of breath  Please take furosemide 1 tablet a day for the next 7 days to help get rid of some of the fluid, this will make you urinate  ER for severe or worsening symptoms

## 2021-04-06 NOTE — ED Notes (Signed)
Patient transported to X-ray 

## 2021-04-06 NOTE — ED Provider Notes (Signed)
[facility] Provider Note   CSN: [id number] Arrival date & time: 04/06/21  1525     History  Chief Complaint  Patient presents with   Shortness of Breath    Jonathan [name] is a 81 y.o. male.   Shortness of Breath  This patient is a 81 year old male, history of multiple medical problems, recently had back surgery within the last week, discharged from the hospital and states that since discharge over the last 5 to 6 days he has had worsening neuropathy and pain in his legs which today was worse than usual.  He went to his family doctor who told him that they could do nothing for him and that he needed to go back to see his surgeon.  He decided to come to this emergency department instead of seeing his surgeon.  He states he did try to call the office but there was no return call from the office.  The patient denies difficulty with urination and in fact states that he has been able to ambulate even today using his Rollator walker.  He has not had any specific pain medicine for his legs.  Home Medications Prior to Admission medications   Medication Sig Start Date End Date Taking? Authorizing Provider  furosemide (LASIX) 20 MG tablet Take 1 tablet (20 mg total) by mouth daily for 7 days. 04/06/21 04/13/21 Yes Noemi Chapel, MD  albuterol (PROVENTIL) (2.5 MG/3ML) 0.083% nebulizer solution Take 3 mLs (2.5 mg total) by nebulization every 6 (six) hours as needed for shortness of breath. 04/06/21   Noemi Chapel, MD  alprostadil (EDEX) 20 MCG injection 20 mcg by Intracavitary route as needed for erectile dysfunction. use no more than 3 times per week Patient not taking: Reported on 03/22/2021 09/08/20   Irine Seal, MD  alprostadil (PROSTIN VR) 500 MCG/ML injection Alprostadil to 68mcg/ml, inject 75ml intracavernosal prn no more than once daily, 2-3x weekly.  Disp 5 - prefilled 1 ml syringe or a 44ml vial with a box of insulin syringes. Patient not taking: Reported on 03/22/2021  09/08/20   Irine Seal, MD  atenolol-chlorthalidone (TENORETIC) 50-25 MG tablet Take 1 tablet by mouth at bedtime.  12/03/18   [provider]  capsaicin (ZOSTRIX) 0.025 % cream Apply 1 application topically daily as needed (Skin Rash). 02/09/20   [provider]  diclofenac Sodium (VOLTAREN) 1 % GEL Apply 2 g topically 4 (four) times daily as needed (pain).    [provider]  donepezil (ARICEPT) 5 MG tablet Take 5 mg by mouth daily. 01/02/21   [provider]  escitalopram (LEXAPRO) 5 MG tablet Take 5 mg by mouth daily.    [provider]  FREESTYLE LITE test strip  09/02/19   [provider]  gabapentin (NEURONTIN) 300 MG capsule Take 1 capsule (300 mg total) by mouth 2 (two) times daily. Patient taking differently: Take 800 mg by mouth 2 (two) times daily. 05/26/20   Kathrynn Ducking, MD  hydroquinone 4 % cream Apply 1 application topically daily as needed (Skin Rash).    [provider]  ibuprofen (ADVIL) 600 MG tablet Take 600 mg by mouth every 6 (six) hours as needed for headache. 05/26/19   [provider]  Incontinence Supply Disposable (FITTED BRIEFS MAXIMUM XL) MISC Use briefs as needed daily for incontinence 06/05/19   Irine Seal, MD  Lancets (FREESTYLE) lancets  09/02/19   [provider]  LANTUS SOLOSTAR 100 UNIT/ML Solostar Pen Inject 2.5 Units into  the skin at bedtime. 11/28/17   [provider]  latanoprost (XALATAN) 0.005 % ophthalmic solution Place 1 drop into both eyes at bedtime. 01/26/21   [provider]  LINZESS 72 MCG capsule Take 72 mcg by mouth daily before breakfast. 05/21/19   [provider]  meclizine (ANTIVERT) 12.5 MG tablet Take 6.25-12.5 mg by mouth every 6 (six) hours as needed for dizziness. 02/16/21   [provider]  metoCLOPramide (REGLAN) 5 MG tablet Take 5 mg by mouth daily as needed for vomiting or nausea.    [provider]  mupirocin cream  (BACTROBAN) 2 % Apply 1 application topically 2 (two) times daily as needed (wound care).    [provider]  mupirocin ointment (BACTROBAN) 2 % Apply 1 application topically 2 (two) times daily as needed (Rash). 09/10/19   [provider]  nystatin ointment (MYCOSTATIN) 1 application daily as needed (Skin Rash). 09/03/19   [provider]  nystatin-triamcinolone ointment (MYCOLOG) Apply 1 application topically 2 (two) times daily. To groins 09/11/19   Irine Seal, MD  Oxycodone HCl 20 MG TABS Take 4 mg by mouth See admin instructions. Five times a day as needed 02/02/18   [provider]  pantoprazole (PROTONIX) 20 MG tablet Take 20 mg by mouth daily as needed for heartburn or indigestion.    [provider]  predniSONE (DELTASONE) 20 MG tablet Take 20 mg by mouth daily as needed (unknown).    [provider]  psyllium (METAMUCIL SMOOTH TEXTURE) 58.6 % powder Take 1 packet by mouth daily. Patient not taking: Reported on 03/22/2021 04/27/19   Rogene Houston, MD  RHOPRESSA 0.02 % SOLN Place 1 drop into the right eye at bedtime. 01/26/21   [provider]  ROCKLATAN 0.02-0.005 % SOLN Place 1 drop into both eyes at bedtime. 02/22/21   [provider]  SPIRIVA HANDIHALER 18 MCG inhalation capsule 18 mcg 2 (two) times daily. 03/16/20   [provider]  tadalafil (CIALIS) 5 MG tablet Take 1 tablet (5 mg total) by mouth daily as needed for erectile dysfunction. 05/05/20   Irine Seal, MD  tamsulosin (FLOMAX) 0.4 MG CAPS capsule Take 0.4 mg by mouth daily after breakfast.    [provider]  terbinafine (LAMISIL) 250 MG tablet Take 250 mg by mouth 2 (two) times daily. 09/03/19   [provider]  timolol (TIMOPTIC) 0.5 % ophthalmic solution Place 1 drop into both eyes 2 (two) times daily as needed (eye pressure).    [provider]  VIAGRA 100 MG tablet Take 1 tablet (100 mg total) by mouth daily as needed  for erectile dysfunction. 08/18/20   Irine Seal, MD  Vibegron (GEMTESA) 75 MG TABS Take 1 tablet by mouth daily. Patient not taking: Reported on 03/22/2021 12/22/20   Irine Seal, MD      Allergies    Ampicillin, Ciprofloxacin, and Levaquin [levofloxacin]    Review of Systems   Review of Systems  Respiratory:  Positive for shortness of breath.   All other systems reviewed and are negative.  Physical Exam Updated Vital Signs BP (!) 151/83 (BP Location: Right Arm)    Pulse 93    Temp 98.5 F (36.9 C) (Oral)    Resp 18    SpO2 99%  Physical Exam Vitals and nursing note reviewed.  Constitutional:      General: He is not in acute distress.    Appearance: He is well-developed.  HENT:     Head:  Normocephalic and atraumatic.     Mouth/Throat:     Pharynx: No oropharyngeal exudate.  Eyes:     General: No scleral icterus.       Right eye: No discharge.        Left eye: No discharge.     Conjunctiva/sclera: Conjunctivae normal.     Pupils: Pupils are equal, round, and reactive to light.  Neck:     Thyroid: No thyromegaly.     Vascular: No JVD.  Cardiovascular:     Rate and Rhythm: Normal rate and regular rhythm.     Heart sounds: Normal heart sounds. No murmur heard.   No friction rub. No gallop.  Pulmonary:     Effort: Pulmonary effort is normal. No respiratory distress.     Breath sounds: Normal breath sounds. No wheezing or rales.     Comments: Totally normal breath sounds, no hypoxia, normal speech pattern Abdominal:     General: Bowel sounds are normal. There is no distension.     Palpations: Abdomen is soft. There is no mass.     Tenderness: There is no abdominal tenderness.  Musculoskeletal:        General: No tenderness. Normal range of motion.     Cervical back: Normal range of motion and neck supple.     Right lower leg: Edema present.     Left lower leg: Edema present.  Lymphadenopathy:     Cervical: No cervical adenopathy.  Skin:    General: Skin is warm and  dry.     Findings: No erythema or rash.  Neurological:     Mental Status: He is alert.     Coordination: Coordination normal.     Comments: The patient is able to slowly lift both legs off of the bed, he states he has chronic neuropathy and "muscle wasting from agent orange exposure".  The patient's voice is normal, phonation is normal, facial symmetry is present, able to use both arms per normal, there is some muscle wasting of the hands bilaterally  Psychiatric:        Behavior: Behavior normal.    ED Results / Procedures / Treatments   Labs (all labs ordered are listed, but only abnormal results are displayed) Labs Reviewed  CBC WITH DIFFERENTIAL/PLATELET - Abnormal; Notable for the following components:      Result Value   RBC 3.93 (*)    Hemoglobin 12.3 (*)    HCT 37.7 (*)    All other components within normal limits  BASIC METABOLIC PANEL - Abnormal; Notable for the following components:   Potassium 3.4 (*)    Glucose, Bld 114 (*)    Calcium 8.3 (*)    All other components within normal limits    EKG None  Radiology DG Chest 2 View  Result Date: 04/06/2021 CLINICAL DATA:  Bilateral leg swelling EXAM: CHEST - 2 VIEW COMPARISON:  02/16/2021 FINDINGS: Stable cardiomediastinal contours. Persistent scarring within the left mid lung. Mildly increased interstitial markings within the lung bases. No new focal airspace consolidation. No pleural effusion or pneumothorax. IMPRESSION: Mildly increased interstitial markings within the lung bases, which could reflect interstitial edema or atypical infection. Electronically Signed   By: Davina Poke D.O.   On: 04/06/2021 16:43   US Venous Img Lower Bilateral  Result Date: 04/06/2021 CLINICAL DATA:  Postoperative swelling. EXAM: BILATERAL LOWER EXTREMITY VENOUS DOPPLER ULTRASOUND TECHNIQUE: Gray-scale sonography with graded compression, as well as color Doppler and duplex ultrasound were performed to evaluate the lower  extremity deep  venous systems from the level of the common femoral vein and including the common femoral, femoral, profunda femoral, popliteal and calf veins including the posterior tibial, peroneal and gastrocnemius veins when visible. The superficial great saphenous vein was also interrogated. Spectral Doppler was utilized to evaluate flow at rest and with distal augmentation maneuvers in the common femoral, femoral and popliteal veins. COMPARISON:  None. FINDINGS: RIGHT LOWER EXTREMITY Common Femoral Vein: No evidence of thrombus. Normal compressibility, respiratory phasicity and response to augmentation. Saphenofemoral Junction: No evidence of thrombus. Normal compressibility and flow on color Doppler imaging. Profunda Femoral Vein: No evidence of thrombus. Normal compressibility and flow on color Doppler imaging. Femoral Vein: No evidence of thrombus. Normal compressibility, respiratory phasicity and response to augmentation. Popliteal Vein: No evidence of thrombus. Normal compressibility, respiratory phasicity and response to augmentation. Calf Veins: No evidence of thrombus. Normal compressibility and flow on color Doppler imaging. Superficial Great Saphenous Vein: No evidence of thrombus. Normal compressibility. Venous Reflux:  None. Other Findings:  None. LEFT LOWER EXTREMITY Common Femoral Vein: No evidence of thrombus. Normal compressibility, respiratory phasicity and response to augmentation. Saphenofemoral Junction: No evidence of thrombus. Normal compressibility and flow on color Doppler imaging. Profunda Femoral Vein: No evidence of thrombus. Normal compressibility and flow on color Doppler imaging. Femoral Vein: No evidence of thrombus. Normal compressibility, respiratory phasicity and response to augmentation. Popliteal Vein: No evidence of thrombus. Normal compressibility, respiratory phasicity and response to augmentation. Calf Veins: No evidence of thrombus. Normal compressibility and flow on color Doppler  imaging. There is limited view of the peroneal vein. Superficial Great Saphenous Vein: No evidence of thrombus. Normal compressibility. Venous Reflux:  None. Other Findings:  None. IMPRESSION: No evidence of deep venous thrombosis in either lower extremity. Electronically Signed   By: Ronney Asters M.D.   On: 04/06/2021 17:20    Procedures Procedures    Medications Ordered in ED Medications  HYDROcodone-acetaminophen (NORCO/VICODIN) 5-325 MG per tablet 2 tablet (2 tablets Oral Given 04/06/21 1758)    ED Course/ Medical Decision Making/ A&P                           Medical Decision Making  This patient presents to the ED for concern of compressive spinal lesion versus neuropathic pain after surgery versus DVT, this involves an extensive number of treatment options, and is a complaint that carries with it a high risk of complications and morbidity.  The differential diagnosis includes those listed above including DVT, neurologic compression, hematoma, sepsis, abscess, the latter being less likely   Co morbidities that complicate the patient evaluation  Diabetic, diabetic neuropathy, chronic back issues and back pain, hypertension   Additional history obtained:  Additional history obtained from medical record including surgical history, lumbar decompression External records from outside source obtained and reviewed including lumbar decompression, released within the last week from the hospital   Lab Tests:  I Ordered, and personally interpreted labs.  The pertinent results include: CBC basic metabolic panel   Imaging Studies ordered:  I ordered imaging studies including venous ultrasound of the bilateral lower extremities to rule out DVT I independently visualized and interpreted imaging which showed no signs of blood clot, chest x-ray without any acute finding I agree with the radiologist interpretation   Cardiac Monitoring:  The patient was maintained on a cardiac monitor.   I personally viewed and interpreted the cardiac monitored which showed an underlying rhythm of: Sinus rhythm  Medicines ordered and prescription drug management:  I ordered medication including hydrocodone for pain Reevaluation of the patient after these medicines showed that the patient improved I have reviewed the patients home medicines and have made adjustments as needed   Test Considered:  Considered CT scan of the chest though this seems less useful as the patient is not tachycardic nor hypoxic and has a normal blood pressure without a fever   Critical Interventions:  Evaluation for DVT and consultation with neurosurgery   Consultations Obtained:  I requested consultation with the neurosurgeon Dr. Christella Noa, discussed the case in detail,  and discussed lab and imaging findings as well as pertinent plan -he has made the recommendation of no need for advanced imaging, reassurance and pain control   Problem List / ED Course:  Postoperative pain, seems like a normal amount of pain, no focal neurologic deficits the outside of his baseline, can follow-up outpatient   Reevaluation:  After the interventions noted above, I reevaluated the patient and found that they have :improved    Dispostion:  After consideration of the diagnostic results and the patients response to treatment, I feel that the patent would benefit from discharge home and follow-up with neurosurgery.          Final Clinical Impression(s) / ED Diagnoses Final diagnoses:  Post-operative pain  Lower extremity edema    Rx / DC Orders ED Discharge Orders          Ordered    furosemide (LASIX) 20 MG tablet  Daily        04/06/21 1718    albuterol (PROVENTIL) (2.5 MG/3ML) 0.083% nebulizer solution  Every 6 hours PRN        04/06/21 1718              Noemi Chapel, MD 04/06/21 [phone]

## 2021-04-11 ENCOUNTER — Ambulatory Visit (INDEPENDENT_AMBULATORY_CARE_PROVIDER_SITE_OTHER): Payer: Medicare Other

## 2021-04-11 ENCOUNTER — Other Ambulatory Visit: Payer: Self-pay

## 2021-04-11 DIAGNOSIS — N3941 Urge incontinence: Secondary | ICD-10-CM | POA: Diagnosis not present

## 2021-04-11 NOTE — Progress Notes (Signed)
PTNS  Session # monthly # 23 of 45  Health & Social Factors: diabetic Caffeine: 2 cups Alcohol: none Daytime voids #per day: 10 Night-time voids #per night: 4 Urgency: yes Incontinence Episodes #per day: 8 Ankle used: left Treatment Setting: 11 Feeling/ Response: positive Comments: n/a  Performed By: Vyla Pint LPN  Follow Up: keep next scheduled [place]

## 2021-04-13 ENCOUNTER — Encounter (HOSPITAL_COMMUNITY): Payer: Self-pay | Admitting: *Deleted

## 2021-04-13 ENCOUNTER — Emergency Department (HOSPITAL_COMMUNITY)
Admission: EM | Admit: 2021-04-13 | Discharge: 2021-04-13 | Disposition: A | Discharge status: Home / Self Care | Payer: Medicare Other | Attending: Emergency Medicine | Admitting: Emergency Medicine

## 2021-04-13 DIAGNOSIS — G8929 Other chronic pain: Secondary | ICD-10-CM | POA: Diagnosis not present

## 2021-04-13 DIAGNOSIS — G8918 Other acute postprocedural pain: Secondary | ICD-10-CM | POA: Diagnosis not present

## 2021-04-13 DIAGNOSIS — M79605 Pain in left leg: Secondary | ICD-10-CM | POA: Diagnosis not present

## 2021-04-13 DIAGNOSIS — M79604 Pain in right leg: Secondary | ICD-10-CM | POA: Diagnosis not present

## 2021-04-13 MED ORDER — METHOCARBAMOL 500 MG PO TABS: 500.0000 mg | ORAL_TABLET | Freq: Two times a day (BID) | ORAL | 0 refills | Status: DC | PRN

## 2021-04-13 MED ORDER — LIDOCAINE 5 % EX PTCH: 1.0000 | MEDICATED_PATCH | CUTANEOUS | 0 refills | Status: DC

## 2021-04-13 MED ORDER — HYDROCODONE-ACETAMINOPHEN 5-325 MG PO TABS
1.0000 | ORAL_TABLET | Freq: Once | ORAL | Status: AC
  Administered 2021-04-13: 1 via ORAL
  Filled 2021-04-13: qty 1

## 2021-04-13 MED ORDER — MELOXICAM 7.5 MG PO TABS: 7.5000 mg | ORAL_TABLET | Freq: Every day | ORAL | 0 refills | Status: DC

## 2021-04-13 NOTE — ED Notes (Signed)
Pt d/c home per MD order. Discharge summary reviewed with pt, pt verbalizes understanding. Reports wife is his discharge ride home. No s/s of acute distress noted at discharge.

## 2021-04-13 NOTE — ED Provider Notes (Signed)
[facility] EMERGENCY DEPARTMENT Provider Note   CSN: [id number] Arrival date & time: 04/13/21  1009     History  Chief Complaint  Patient presents with   Leg Pain    Jonathan [name] is a 81 y.o. male presenting for evaluation of leg pain.  Patient states he had surgery on January 6 on his back.  He has had difficulty controlling his pain, and not yet had a follow-up appointment with the neurosurgeon.  He is on chronic pain medication, but states he has been using it more than prescribed to help control his pain, as such he ran out of his medicine yesterday.  He reports today he is having a lot of pain in both legs.  No new fall, trauma, or injury.  No fevers or chills.  No drainage from the site.  He has been taking the gabapentin as prescribed.  He is not taking anything else including Tylenol, ibuprofen, muscle relaxers.  He has called his PCP (who writes his chronic pain meds), who told him he needs to follow-up with his neurosurgeon.  HPI     Home Medications Prior to Admission medications   Medication Sig Start Date End Date Taking? Authorizing Provider  lidocaine (LIDODERM) 5 % Place 1 patch onto the skin daily. Remove & Discard patch within 12 hours or as directed by MD 04/13/21  Yes Danetra Glock, PA-C  meloxicam (MOBIC) 7.5 MG tablet Take 1 tablet (7.5 mg total) by mouth daily. 04/13/21  Yes Leonides Minder, PA-C  methocarbamol (ROBAXIN) 500 MG tablet Take 1 tablet (500 mg total) by mouth 2 (two) times daily as needed for muscle spasms. 04/13/21  Yes Mali Eppard, PA-C  albuterol (PROVENTIL) (2.5 MG/3ML) 0.083% nebulizer solution Take 3 mLs (2.5 mg total) by nebulization every 6 (six) hours as needed for shortness of breath. 04/06/21   Noemi Chapel, MD  alprostadil (EDEX) 20 MCG injection 20 mcg by Intracavitary route as needed for erectile dysfunction. use no more than 3 times per week Patient not taking: Reported on 03/22/2021 09/08/20   Irine Seal, MD   alprostadil (PROSTIN VR) 500 MCG/ML injection Alprostadil to 26mcg/ml, inject 109ml intracavernosal prn no more than once daily, 2-3x weekly.  Disp 5 - prefilled 1 ml syringe or a 31ml vial with a box of insulin syringes. Patient not taking: Reported on 03/22/2021 09/08/20   Irine Seal, MD  atenolol-chlorthalidone (TENORETIC) 50-25 MG tablet Take 1 tablet by mouth at bedtime.  12/03/18   [provider]  capsaicin (ZOSTRIX) 0.025 % cream Apply 1 application topically daily as needed (Skin Rash). 02/09/20   [provider]  diclofenac Sodium (VOLTAREN) 1 % GEL Apply 2 g topically 4 (four) times daily as needed (pain).    [provider]  donepezil (ARICEPT) 5 MG tablet Take 5 mg by mouth daily. 01/02/21   [provider]  escitalopram (LEXAPRO) 5 MG tablet Take 5 mg by mouth daily.    [provider]  FREESTYLE LITE test strip  09/02/19   [provider]  furosemide (LASIX) 20 MG tablet Take 1 tablet (20 mg total) by mouth daily for 7 days. 04/06/21 04/13/21  Noemi Chapel, MD  gabapentin (NEURONTIN) 300 MG capsule Take 1 capsule (300 mg total) by mouth 2 (two) times daily. Patient taking differently: Take 800 mg by mouth 2 (two) times daily. 05/26/20   Kathrynn Ducking, MD  hydroquinone 4 % cream Apply 1 application topically daily as needed (Skin Rash).  [provider]  ibuprofen (ADVIL) 600 MG tablet Take 600 mg by mouth every 6 (six) hours as needed for headache. 05/26/19   [provider]  Incontinence Supply Disposable (FITTED BRIEFS MAXIMUM XL) MISC Use briefs as needed daily for incontinence 06/05/19   Irine Seal, MD  Lancets (FREESTYLE) lancets  09/02/19   [provider]  LANTUS SOLOSTAR 100 UNIT/ML Solostar Pen Inject 2.5 Units into the skin at bedtime. 11/28/17   [provider]  latanoprost (XALATAN) 0.005 % ophthalmic solution Place 1 drop into both eyes at bedtime. 01/26/21   [provider]  LINZESS  72 MCG capsule Take 72 mcg by mouth daily before breakfast. 05/21/19   [provider]  meclizine (ANTIVERT) 12.5 MG tablet Take 6.25-12.5 mg by mouth every 6 (six) hours as needed for dizziness. 02/16/21   [provider]  metoCLOPramide (REGLAN) 5 MG tablet Take 5 mg by mouth daily as needed for vomiting or nausea.    [provider]  mupirocin cream (BACTROBAN) 2 % Apply 1 application topically 2 (two) times daily as needed (wound care).    [provider]  mupirocin ointment (BACTROBAN) 2 % Apply 1 application topically 2 (two) times daily as needed (Rash). 09/10/19   [provider]  nystatin ointment (MYCOSTATIN) 1 application daily as needed (Skin Rash). 09/03/19   [provider]  nystatin-triamcinolone ointment (MYCOLOG) Apply 1 application topically 2 (two) times daily. To groins 09/11/19   Irine Seal, MD  Oxycodone HCl 20 MG TABS Take 4 mg by mouth See admin instructions. Five times a day as needed 02/02/18   [provider]  pantoprazole (PROTONIX) 20 MG tablet Take 20 mg by mouth daily as needed for heartburn or indigestion.    [provider]  predniSONE (DELTASONE) 20 MG tablet Take 20 mg by mouth daily as needed (unknown).    [provider]  psyllium (METAMUCIL SMOOTH TEXTURE) 58.6 % powder Take 1 packet by mouth daily. Patient not taking: Reported on 03/22/2021 04/27/19   Rogene Houston, MD  RHOPRESSA 0.02 % SOLN Place 1 drop into the right eye at bedtime. 01/26/21   [provider]  ROCKLATAN 0.02-0.005 % SOLN Place 1 drop into both eyes at bedtime. 02/22/21   [provider]  SPIRIVA HANDIHALER 18 MCG inhalation capsule 18 mcg 2 (two) times daily. 03/16/20   [provider]  tadalafil (CIALIS) 5 MG tablet Take 1 tablet (5 mg total) by mouth daily as needed for erectile dysfunction. 05/05/20   Irine Seal, MD  tamsulosin (FLOMAX) 0.4 MG CAPS capsule Take 0.4 mg by mouth daily  after breakfast.    [provider]  terbinafine (LAMISIL) 250 MG tablet Take 250 mg by mouth 2 (two) times daily. 09/03/19   [provider]  timolol (TIMOPTIC) 0.5 % ophthalmic solution Place 1 drop into both eyes 2 (two) times daily as needed (eye pressure).    [provider]  VIAGRA 100 MG tablet Take 1 tablet (100 mg total) by mouth daily as needed for erectile dysfunction. 08/18/20   Irine Seal, MD  Vibegron (GEMTESA) 75 MG TABS Take 1 tablet by mouth daily. Patient not taking: Reported on 03/22/2021 12/22/20   Irine Seal, MD      Allergies    Ampicillin, Ciprofloxacin, and Levaquin [levofloxacin]    Review of Systems   Review of Systems  Musculoskeletal:  Positive for arthralgias, back pain and myalgias.  All other systems reviewed and are negative.  Physical Exam Updated Vital Signs BP (!) 153/79 (BP Location: Right Arm)    Pulse 92    Temp 98.4 F (36.9 C) (Oral)    Resp 20    SpO2 97%  Physical Exam Vitals and nursing note reviewed.  Constitutional:      General: He is not in acute distress.    Appearance: Normal appearance.     Comments: nontoxic  HENT:     Head: Normocephalic and atraumatic.  Eyes:     Extraocular Movements: Extraocular movements intact.     Conjunctiva/sclera: Conjunctivae normal.     Pupils: Pupils are equal, round, and reactive to light.  Cardiovascular:     Rate and Rhythm: Normal rate and regular rhythm.     Pulses: Normal pulses.  Pulmonary:     Effort: Pulmonary effort is normal. No respiratory distress.     Breath sounds: Normal breath sounds. No wheezing.     Comments: Speaking in full sentences.  Clear lung sounds in all fields. Abdominal:     General: There is no distension.     Palpations: Abdomen is soft. There is no mass.     Tenderness: There is no abdominal tenderness. There is no guarding or rebound.  Musculoskeletal:        General: Normal range of motion.     Cervical back: Normal range of motion  and neck supple.     Comments: Incision well healing without induration/redness. No drainage or ttp of the site.   Skin:    General: Skin is warm and dry.     Capillary Refill: Capillary refill takes less than 2 seconds.  Neurological:     Mental Status: He is alert and oriented to person, place, and time.  Psychiatric:        Mood and Affect: Mood and affect normal.        Speech: Speech normal.        Behavior: Behavior normal.    ED Results / Procedures / Treatments   Labs (all labs ordered are listed, but only abnormal results are displayed) Labs Reviewed - No data to display  EKG None  Radiology No results found.  Procedures Procedures    Medications Ordered in ED Medications  HYDROcodone-acetaminophen (NORCO/VICODIN) 5-325 MG per tablet 1 tablet (1 tablet Oral Given 04/13/21 1321)    ED Course/ Medical Decision Making/ A&P                           Medical Decision Making Risk Prescription drug management.    This patient presents to the ED for concern of back and leg pain. This involves a number of treatment options, and is a complaint that carries with it a low risk of complications and morbidity.  The differential diagnosis includes post-op pain, infection, chronic pain, neuropathy    Co morbidities:  DM with neuropathy, recent lumbar laminectomy/decompression    Additional history: Reviewed op notes from recent surgery. Reviewed ER note from 1/12 in which pt needed further pain control.   Medicines ordered:  I ordered medication including norco  for pain control   Disposition:  After consideration of the diagnostic results and the patients response to treatment, I feel that the patent would benefit from following up with his neurosurgeon and further outpatient management.  I discussed that I cannot give him a prescription for narcotic pain medicine as this may nullify his pain contract.  Discussed that he needs to follow-up  with his neurosurgeon for  further management of pain control.  I discussed nonnarcotic options including muscle relaxers, NSAIDs, and lidocaine patch, patient would like to try this.  Message sent to patient's neurosurgeon to inform them that patient is having difficulty controlling his pain.  At this time, patient appears safe for discharge.  Return precautions given.  Patient states he understands and agrees to plan  Final Clinical Impression(s) / ED Diagnoses Final diagnoses:  Post-op pain  Other chronic pain    Rx / DC Orders ED Discharge Orders          Ordered    methocarbamol (ROBAXIN) 500 MG tablet  2 times daily PRN        04/13/21 1317    lidocaine (LIDODERM) 5 %  Every 24 hours        04/13/21 1317    meloxicam (MOBIC) 7.5 MG tablet  Daily        04/13/21 1317              Vicent Febles, PA-C 04/13/21 1749    Noemi Chapel, MD 04/14/21 1450

## 2021-04-13 NOTE — ED Triage Notes (Signed)
Pain in right leg after surgery, states he needs pain control

## 2021-04-13 NOTE — Discharge Instructions (Signed)
Continue taking home medications as prescribed. Take mobic daily to help with pain.  Do not take other anti-inflammatories at the same time open (ibuprofen, Advil, Motrin, naproxen, Aleve). You may supplement with Tylenol if you need further pain control. Use ice packs or heating pads if this helps control your pain. Use the numbing patch to help with pain. Use the muscle relaxers for pain control.  Have caution, this may make you tired and groggy. Call Dr. Lacy Duverney office for further management of your pain. Follow-up with primary care doctor as needed for recheck of symptoms. Return to the emergency room with any new, worsening, concerning symptoms

## 2021-04-16 NOTE — Anesthesia Postprocedure Evaluation (Signed)
Anesthesia Post Note  Patient: GUSTAVE LINDEMAN  Procedure(s) Performed: Bilateral Lumbar Three- Four, Lumbar Four-Five Laminectomy, Decompression (Right)     Patient location during evaluation: PACU Anesthesia Type: General Level of consciousness: awake and alert Pain management: pain level controlled Vital Signs Assessment: post-procedure vital signs reviewed and stable Respiratory status: spontaneous breathing, nonlabored ventilation, respiratory function stable and patient connected to nasal cannula oxygen Cardiovascular status: blood pressure returned to baseline and stable Postop Assessment: no apparent nausea or vomiting Anesthetic complications: no   No notable events documented.  Last Vitals:  Vitals:   04/13/21 1132 04/13/21 1321  BP: (!) 146/84 (!) 153/79  Pulse: (!) 108 92  Resp: 20 20  Temp: 36.6 C 36.9 C  SpO2: 98% 97%    Last Pain:  Vitals:   04/13/21 1339  TempSrc:   PainSc: 9                  Christabelle Hanzlik

## 2021-04-21 DIAGNOSIS — Z79891 Long term (current) use of opiate analgesic: Secondary | ICD-10-CM | POA: Diagnosis not present

## 2021-04-21 DIAGNOSIS — E1142 Type 2 diabetes mellitus with diabetic polyneuropathy: Secondary | ICD-10-CM | POA: Diagnosis not present

## 2021-04-21 DIAGNOSIS — Z7952 Long term (current) use of systemic steroids: Secondary | ICD-10-CM | POA: Diagnosis not present

## 2021-04-21 DIAGNOSIS — G4700 Insomnia, unspecified: Secondary | ICD-10-CM | POA: Diagnosis not present

## 2021-04-21 DIAGNOSIS — N401 Enlarged prostate with lower urinary tract symptoms: Secondary | ICD-10-CM | POA: Diagnosis not present

## 2021-04-21 DIAGNOSIS — Z8546 Personal history of malignant neoplasm of prostate: Secondary | ICD-10-CM | POA: Diagnosis not present

## 2021-04-21 DIAGNOSIS — M19072 Primary osteoarthritis, left ankle and foot: Secondary | ICD-10-CM | POA: Diagnosis not present

## 2021-04-21 DIAGNOSIS — Z87440 Personal history of urinary (tract) infections: Secondary | ICD-10-CM | POA: Diagnosis not present

## 2021-04-21 DIAGNOSIS — Z85118 Personal history of other malignant neoplasm of bronchus and lung: Secondary | ICD-10-CM | POA: Diagnosis not present

## 2021-04-21 DIAGNOSIS — I10 Essential (primary) hypertension: Secondary | ICD-10-CM | POA: Diagnosis not present

## 2021-04-21 DIAGNOSIS — Q7161 Lobster-claw right hand: Secondary | ICD-10-CM | POA: Diagnosis not present

## 2021-04-21 DIAGNOSIS — Z791 Long term (current) use of non-steroidal anti-inflammatories (NSAID): Secondary | ICD-10-CM | POA: Diagnosis not present

## 2021-04-21 DIAGNOSIS — F32A Depression, unspecified: Secondary | ICD-10-CM | POA: Diagnosis not present

## 2021-04-21 DIAGNOSIS — R339 Retention of urine, unspecified: Secondary | ICD-10-CM | POA: Diagnosis not present

## 2021-04-21 DIAGNOSIS — Z794 Long term (current) use of insulin: Secondary | ICD-10-CM | POA: Diagnosis not present

## 2021-04-21 DIAGNOSIS — M810 Age-related osteoporosis without current pathological fracture: Secondary | ICD-10-CM | POA: Diagnosis not present

## 2021-04-21 DIAGNOSIS — N3941 Urge incontinence: Secondary | ICD-10-CM | POA: Diagnosis not present

## 2021-04-21 DIAGNOSIS — I87323 Chronic venous hypertension (idiopathic) with inflammation of bilateral lower extremity: Secondary | ICD-10-CM | POA: Diagnosis not present

## 2021-04-21 DIAGNOSIS — M109 Gout, unspecified: Secondary | ICD-10-CM | POA: Diagnosis not present

## 2021-04-21 DIAGNOSIS — M48061 Spinal stenosis, lumbar region without neurogenic claudication: Secondary | ICD-10-CM | POA: Diagnosis not present

## 2021-04-21 DIAGNOSIS — K5909 Other constipation: Secondary | ICD-10-CM | POA: Diagnosis not present

## 2021-04-21 DIAGNOSIS — G5620 Lesion of ulnar nerve, unspecified upper limb: Secondary | ICD-10-CM | POA: Diagnosis not present

## 2021-04-21 DIAGNOSIS — E785 Hyperlipidemia, unspecified: Secondary | ICD-10-CM | POA: Diagnosis not present

## 2021-04-21 DIAGNOSIS — Z79899 Other long term (current) drug therapy: Secondary | ICD-10-CM | POA: Diagnosis not present

## 2021-04-21 DIAGNOSIS — J439 Emphysema, unspecified: Secondary | ICD-10-CM | POA: Diagnosis not present

## 2021-04-24 DIAGNOSIS — J439 Emphysema, unspecified: Secondary | ICD-10-CM | POA: Diagnosis not present

## 2021-04-24 DIAGNOSIS — E1142 Type 2 diabetes mellitus with diabetic polyneuropathy: Secondary | ICD-10-CM | POA: Diagnosis not present

## 2021-04-24 DIAGNOSIS — I87323 Chronic venous hypertension (idiopathic) with inflammation of bilateral lower extremity: Secondary | ICD-10-CM | POA: Diagnosis not present

## 2021-04-24 DIAGNOSIS — M48061 Spinal stenosis, lumbar region without neurogenic claudication: Secondary | ICD-10-CM | POA: Diagnosis not present

## 2021-04-24 DIAGNOSIS — M19072 Primary osteoarthritis, left ankle and foot: Secondary | ICD-10-CM | POA: Diagnosis not present

## 2021-04-24 DIAGNOSIS — I10 Essential (primary) hypertension: Secondary | ICD-10-CM | POA: Diagnosis not present

## 2021-04-26 DIAGNOSIS — J439 Emphysema, unspecified: Secondary | ICD-10-CM | POA: Diagnosis not present

## 2021-04-26 DIAGNOSIS — E1142 Type 2 diabetes mellitus with diabetic polyneuropathy: Secondary | ICD-10-CM | POA: Diagnosis not present

## 2021-04-26 DIAGNOSIS — I10 Essential (primary) hypertension: Secondary | ICD-10-CM | POA: Diagnosis not present

## 2021-04-26 DIAGNOSIS — M48061 Spinal stenosis, lumbar region without neurogenic claudication: Secondary | ICD-10-CM | POA: Diagnosis not present

## 2021-04-26 DIAGNOSIS — M19072 Primary osteoarthritis, left ankle and foot: Secondary | ICD-10-CM | POA: Diagnosis not present

## 2021-04-26 DIAGNOSIS — I87323 Chronic venous hypertension (idiopathic) with inflammation of bilateral lower extremity: Secondary | ICD-10-CM | POA: Diagnosis not present

## 2021-04-27 ENCOUNTER — Ambulatory Visit: Payer: Medicare Other | Admitting: Orthopedic Surgery

## 2021-05-01 ENCOUNTER — Other Ambulatory Visit: Payer: Self-pay

## 2021-05-01 ENCOUNTER — Ambulatory Visit (INDEPENDENT_AMBULATORY_CARE_PROVIDER_SITE_OTHER): Payer: Medicare Other

## 2021-05-01 DIAGNOSIS — N3941 Urge incontinence: Secondary | ICD-10-CM

## 2021-05-01 DIAGNOSIS — M48061 Spinal stenosis, lumbar region without neurogenic claudication: Secondary | ICD-10-CM | POA: Diagnosis not present

## 2021-05-01 DIAGNOSIS — J439 Emphysema, unspecified: Secondary | ICD-10-CM | POA: Diagnosis not present

## 2021-05-01 DIAGNOSIS — M19072 Primary osteoarthritis, left ankle and foot: Secondary | ICD-10-CM | POA: Diagnosis not present

## 2021-05-01 DIAGNOSIS — E1142 Type 2 diabetes mellitus with diabetic polyneuropathy: Secondary | ICD-10-CM | POA: Diagnosis not present

## 2021-05-01 DIAGNOSIS — I10 Essential (primary) hypertension: Secondary | ICD-10-CM | POA: Diagnosis not present

## 2021-05-01 DIAGNOSIS — I87323 Chronic venous hypertension (idiopathic) with inflammation of bilateral lower extremity: Secondary | ICD-10-CM | POA: Diagnosis not present

## 2021-05-01 NOTE — Progress Notes (Signed)
PTNS  Session # 24 of 45  Health & Social Factors: diabetic, CHF,and fluid pills Caffeine: 1 cup daily Alcohol: none Daytime voids #per day: 10 Night-time voids #per night: 4 Urgency: yes Incontinence Episodes #per day: 4 Ankle used: left Treatment Setting: 8 Feeling/ Response: positive sensation Comments: na  Performed By: [facility] LPN  Follow Up: keep scheduled monthly PTNS

## 2021-05-03 DIAGNOSIS — I10 Essential (primary) hypertension: Secondary | ICD-10-CM | POA: Diagnosis not present

## 2021-05-03 DIAGNOSIS — I87323 Chronic venous hypertension (idiopathic) with inflammation of bilateral lower extremity: Secondary | ICD-10-CM | POA: Diagnosis not present

## 2021-05-03 DIAGNOSIS — E1142 Type 2 diabetes mellitus with diabetic polyneuropathy: Secondary | ICD-10-CM | POA: Diagnosis not present

## 2021-05-03 DIAGNOSIS — M19072 Primary osteoarthritis, left ankle and foot: Secondary | ICD-10-CM | POA: Diagnosis not present

## 2021-05-03 DIAGNOSIS — J439 Emphysema, unspecified: Secondary | ICD-10-CM | POA: Diagnosis not present

## 2021-05-03 DIAGNOSIS — M48061 Spinal stenosis, lumbar region without neurogenic claudication: Secondary | ICD-10-CM | POA: Diagnosis not present

## 2021-05-05 DIAGNOSIS — H401133 Primary open-angle glaucoma, bilateral, severe stage: Secondary | ICD-10-CM | POA: Diagnosis not present

## 2021-05-09 ENCOUNTER — Ambulatory Visit: Payer: Medicare Other

## 2021-05-09 DIAGNOSIS — I87323 Chronic venous hypertension (idiopathic) with inflammation of bilateral lower extremity: Secondary | ICD-10-CM | POA: Diagnosis not present

## 2021-05-09 DIAGNOSIS — M19072 Primary osteoarthritis, left ankle and foot: Secondary | ICD-10-CM | POA: Diagnosis not present

## 2021-05-09 DIAGNOSIS — M48061 Spinal stenosis, lumbar region without neurogenic claudication: Secondary | ICD-10-CM | POA: Diagnosis not present

## 2021-05-09 DIAGNOSIS — J439 Emphysema, unspecified: Secondary | ICD-10-CM | POA: Diagnosis not present

## 2021-05-09 DIAGNOSIS — I10 Essential (primary) hypertension: Secondary | ICD-10-CM | POA: Diagnosis not present

## 2021-05-09 DIAGNOSIS — E1142 Type 2 diabetes mellitus with diabetic polyneuropathy: Secondary | ICD-10-CM | POA: Diagnosis not present

## 2021-05-18 DIAGNOSIS — I10 Essential (primary) hypertension: Secondary | ICD-10-CM | POA: Diagnosis not present

## 2021-05-18 DIAGNOSIS — J439 Emphysema, unspecified: Secondary | ICD-10-CM | POA: Diagnosis not present

## 2021-05-18 DIAGNOSIS — I87323 Chronic venous hypertension (idiopathic) with inflammation of bilateral lower extremity: Secondary | ICD-10-CM | POA: Diagnosis not present

## 2021-05-18 DIAGNOSIS — E1142 Type 2 diabetes mellitus with diabetic polyneuropathy: Secondary | ICD-10-CM | POA: Diagnosis not present

## 2021-05-18 DIAGNOSIS — M48061 Spinal stenosis, lumbar region without neurogenic claudication: Secondary | ICD-10-CM | POA: Diagnosis not present

## 2021-05-18 DIAGNOSIS — M19072 Primary osteoarthritis, left ankle and foot: Secondary | ICD-10-CM | POA: Diagnosis not present

## 2021-05-23 DIAGNOSIS — R42 Dizziness and giddiness: Secondary | ICD-10-CM | POA: Diagnosis not present

## 2021-05-23 DIAGNOSIS — K5901 Slow transit constipation: Secondary | ICD-10-CM | POA: Diagnosis not present

## 2021-05-23 DIAGNOSIS — I10 Essential (primary) hypertension: Secondary | ICD-10-CM | POA: Diagnosis not present

## 2021-05-23 DIAGNOSIS — J701 Chronic and other pulmonary manifestations due to radiation: Secondary | ICD-10-CM | POA: Diagnosis not present

## 2021-05-23 DIAGNOSIS — E0865 Diabetes mellitus due to underlying condition with hyperglycemia: Secondary | ICD-10-CM | POA: Diagnosis not present

## 2021-05-23 DIAGNOSIS — R531 Weakness: Secondary | ICD-10-CM | POA: Diagnosis not present

## 2021-05-23 DIAGNOSIS — R4189 Other symptoms and signs involving cognitive functions and awareness: Secondary | ICD-10-CM | POA: Diagnosis not present

## 2021-05-23 DIAGNOSIS — Z85118 Personal history of other malignant neoplasm of bronchus and lung: Secondary | ICD-10-CM | POA: Diagnosis not present

## 2021-05-25 ENCOUNTER — Encounter: Payer: Self-pay | Admitting: Orthopedic Surgery

## 2021-05-25 ENCOUNTER — Other Ambulatory Visit: Payer: Self-pay

## 2021-05-25 ENCOUNTER — Ambulatory Visit (INDEPENDENT_AMBULATORY_CARE_PROVIDER_SITE_OTHER): Payer: Medicare Other | Admitting: Orthopedic Surgery

## 2021-05-25 DIAGNOSIS — M25571 Pain in right ankle and joints of right foot: Secondary | ICD-10-CM | POA: Diagnosis not present

## 2021-05-25 DIAGNOSIS — M25372 Other instability, left ankle: Secondary | ICD-10-CM

## 2021-05-25 DIAGNOSIS — R29898 Other symptoms and signs involving the musculoskeletal system: Secondary | ICD-10-CM | POA: Diagnosis not present

## 2021-05-25 NOTE — Progress Notes (Signed)
Office Visit Note   Patient: Jonathan [name]           Date of Birth: [birth year]           MRN: [id number] Visit Date: 05/25/2021              Requested by: [name], PA-C [place],  [place] [id number] PCP: [name], PA-C  Chief Complaint  Patient presents with   Right Ankle - Follow-up    S/p right ankle arthroplasty      HPI: Patient is a 81 year old gentleman who presents in follow-up for both ankles.  Patient complains of weakness and pain in both ankles.  He is status post total ankle arthroplasty on the right.  He is wearing a brace on the right which helps a lot with his ankle instability and pain.  Patient does not have a brace for the left ankle.  Assessment & Plan: Visit Diagnoses:  1. Pain in right ankle and joints of right foot   2. Ankle weakness   3. Unstable ankle, left     Plan: Patient is provided a prescription for a Futuro brace left ankle.  I feel patient will need this brace to help with the instability and weakness of the left ankle. Patient will need to continue wearing the brace for the weakness and instability of the right ankle. Follow-Up Instructions: Return if symptoms worsen or fail to improve.   Ortho Exam  Patient is alert, oriented, no adenopathy, well-dressed, normal affect, normal respiratory effort. Examination patient has good ankle and subtalar range of motion however there is laxity and instability of the ankle he has weakness of the peroneal tendons and posterior tibial tendon.  Examination of the right ankle he has good range of motion as well with ligamentous weakness in the right ankle as well.  Imaging: No results found. No images are attached to the encounter.  Labs: Lab Results  Component Value Date   HGBA1C 5.8 (H) 03/28/2021   HGBA1C 5.9 (H) 01/30/2019   HGBA1C 6.3 (H) 06/13/2015   ESRSEDRATE 26 01/06/2021   ESRSEDRATE 20 01/06/2020   REPTSTATUS 10/05/2019 FINAL 10/04/2019   GRAMSTAIN  06/17/2015    NO  WBC SEEN NO SQUAMOUS EPITHELIAL CELLS SEEN NO ORGANISMS SEEN Performed at [facility] (A) 10/04/2019    <10,000 COLONIES/mL INSIGNIFICANT GROWTH Performed at [facility] [address] [place], [place] [address]    LABORGA MORGANELLA MORGANII (A) 09/18/2019     Lab Results  Component Value Date   ALBUMIN 3.4 (L) 12/21/2017   ALBUMIN 3.8 12/10/2017   ALBUMIN 3.6 10/25/2016    No results found for: MG No results found for: VD25OH  No results found for: PREALBUMIN CBC EXTENDED Latest Ref Rng & Units 04/06/2021 03/31/2021 03/28/2021  WBC 4.0 - 10.5 K/uL 7.3 8.4 7.5  RBC 4.22 - 5.81 MIL/uL 3.93(L) 4.45 4.37  HGB 13.0 - 17.0 g/dL 12.3(L) 13.8 13.5  HCT 39.0 - 52.0 % 37.7(L) 40.8 40.6  PLT 150 - 400 K/uL 253 182 209  NEUTROABS 1.7 - 7.7 K/uL 5.1 - -  LYMPHSABS 0.7 - 4.0 K/uL 1.4 - -     There is no height or weight on file to calculate BMI.  Orders:  No orders of the defined types were placed in this encounter.  No orders of the defined types were placed in this encounter.    Procedures: No procedures performed  Clinical Data:  No additional findings.  ROS:  All other systems negative, except as noted in the HPI. Review of Systems  Objective: Vital Signs: There were no vitals taken for this visit.  Specialty Comments:  No specialty comments available.  PMFS History: Patient Active Problem List   Diagnosis Date Noted   Lumbar stenosis with neurogenic claudication 03/31/2021   Gait abnormality 01/06/2020   History of peptic ulcer disease    History of prostate cancer 04/10/2019   Urge incontinence 04/10/2019   Urinary retention 04/18/2018   Malignant neoplasm of prostate ([place]) 07/10/2017   Arthritis of right subtalar joint 04/18/2017   Claw hand due to intrinsic minus deformity, right 05/22/2016   Idiopathic chronic venous hypertension of both lower extremities with inflammation 05/22/2016   Fibrosis of subtalar joint, right  03/08/2016   Primary malignant neoplasm of left upper lobe of lung ([place]) 09/21/2015   Lung cancer ([place]) 07/04/2015   Diabetes mellitus type II, controlled ([place]) 06/01/2015   Neoplasm of uncertain behavior of right upper lobe of lung 05/31/2015   Neoplasm of uncertain behavior of left upper lobe of lung 05/31/2015   Emphysema of lung ([place])    Kidney stones    Osteoporosis    H/O total ankle replacement, right 04/20/2015   Carpal tunnel syndrome - right 07/09/2014   Bloating 06/14/2014   History of colonic polyps 06/14/2014   Diabetic polyneuropathy associated with type 2 diabetes mellitus ([place]) 12/05/2012   Lesion of ulnar nerve 12/05/2012   Syncope 11/15/2012   Gout 11/15/2012   Chronic constipation 04/02/2009   CYSTITIS, RECURRENT 12/02/2008   COPD GOLD 2 s/p RULobectomy/ RT pneumonitis  10/26/2008   DIVERTICULUM, BLADDER 10/11/2008   INSOMNIA 07/24/2008   Acute cystitis without hematuria 07/20/2008   FATIGUE 07/20/2008   DEPRESSION 02/27/2008   CHEST PAIN UNSPECIFIED 02/27/2008   NICOTINE ADDICTION 08/12/2007   DYSLIPIDEMIA 07/22/2007   OBESITY 07/22/2007   ERECTILE DYSFUNCTION 07/22/2007   HYPERTENSION 07/22/2007   Past Medical History:  Diagnosis Date   Acquired bladder diverticulum    12/ 2012  resection diverticulum done at [facility] in [place], [place]   Age-related cataract of right eye    scheduled for catarat extraction 09-12-2017   Agent orange exposure    Aneurysm of infrarenal abdominal aorta    first dx 2017--- last CT 06-11-2017  measures 3.5cm   Anxiety    Chronic constipation    COPD with emphysema ([place])    06--12-2017  per pt no symptoms since Feb 2019   Depression    PTSD   Diabetes mellitus type 2, diet-controlled ([place])    Diabetic neuropathy ([place])    Dyslipidemia    Dyspnea on exertion    Erectile dysfunction    Feeling of incomplete bladder emptying    Gait abnormality 01/06/2020   GERD (gastroesophageal reflux disease)    Gout    09-02-2017 last  flare up 3 months ago   Hiatal hernia    History of atrial fibrillation    episode post op right lung lobectomy 07-04-2015   History of bladder stone    History of colon polyps    History of DVT of lower extremity    03/ 2019  bilateral lower extremity (superficial) ---  per treated w/ oral medication    History of gastritis    History of kidney stones    History of radiation therapy 09-14-2015  to 09-21-2015   left upper lung nodule -- 54Gy in 3 fractions (18 Gy per fraction)  Hyperplasia of prostate with lower urinary tract symptoms (LUTS)    Hypertension    Lumbar spinal stenosis    Nephrolithiasis    Neurogenic bladder    Osteoarthritis    ankle, hands   Osteoporosis    Prostate cancer [facility]) urologist-  dr wrenn/  oncologist-  dr Tammi Klippel   dx 05-24-2017-- Stage T2b,  Gleason 4+4,  PSA 22.41,  vol 38cc--- started ADT 04/ 2019,  plan external radiation therapy   Radiation fibrosis of lung ([place])    hx left upper long nodule SBRT 06/ 2017   Squamous cell carcinoma of both lungs ([place]) last CT in epic dated 06-26-2017 no recurrence   dx 03/ 2017  non-small cell SCC via bronchoscopy w/ bx's by dr Roxan Hockey---  s/p  right VATS w/ right lobectomy and node dissection's 07-04-2015 /  pt had SBRT to left upper nodule Stage 1 ([facility]) completed 09-21-2015   Vertigo 2018   Wears dentures    bottom   Wears glasses     Family History  Problem Relation Age of Onset   Cancer Father        Asbestos   Colon cancer Neg Hx    Colon polyps Neg Hx    Kidney disease Neg Hx    Esophageal cancer Neg Hx    Gallbladder disease Neg Hx    Heart disease Neg Hx    Diabetes Neg Hx     Past Surgical History:  Procedure Laterality Date   BIOPSY  05/29/2019   Procedure: BIOPSY;  Surgeon: Rogene Houston, MD;  [facility];  Service: Endoscopy;;  gastric ulcer   CARPAL TUNNEL RELEASE Right 07/09/2014   Procedure: RIGHT OPEN CARPAL TUNNEL RELEASE;  Surgeon: Mcarthur Rossetti, MD;  [facility];  Service: Orthopedics;  Laterality: Right;   CHOLECYSTECTOMY N/A 02/24/2014   Procedure: LAPAROSCOPIC CHOLECYSTECTOMY;  Surgeon: Coralie Keens, MD;  Location: [facility];  Service: General;  Laterality: N/A;   COLONOSCOPY     CYSTOLITHOTOMY  11/ 2007      [facility] [place] [facility]/18[facility]2018   Procedure: CYSTOSCOPY FLEXIBLE;  Surgeon: Irine Seal, MD;  Location: [facility];  Service: Urology;  Laterality: N/A;   CYSTOSCOPY W/ LITHOLAPAXY / EHL  02-24-2007   dr Janice Norrie  [facility]   dental implant     No teeth at this time waiting for them to be made as of 01-30-19   ESOPHAGOGASTRODUODENOSCOPY (EGD) WITH PROPOFOL N/A 05/29/2019   Procedure: ESOPHAGOGASTRODUODENOSCOPY (EGD) WITH PROPOFOL;  Surgeon: Rogene Houston, MD;  [facility];  Service: Endoscopy;  Laterality: N/A;  [place] IMPLANT N/A 09/05/2017   Procedure: GOLD SEED IMPLANT;  Surgeon: Irine Seal, MD;  Location: [facility];  Service: Urology;  Laterality: N/A;   INSERTION OF SUPRAPUBIC CATHETER N/A 04/18/2018   Procedure: SUPRAPUBIC TUBE CHANGE;  Surgeon: Irine Seal, MD;  [facility];  Service: Urology;  Laterality: N/A;   IR CATHETER TUBE CHANGE  02/24/2018   LUMBAR LAMINECTOMY/DECOMPRESSION MICRODISCECTOMY Right 03/31/2021   Procedure: Bilateral Lumbar Three- Four, Lumbar Four-Five Laminectomy, Decompression;  Surgeon: Ashok Pall, MD;  Location: [facility];  Service: Neurosurgery;  Laterality: Right;   SPACE OAR INSTILLATION N/A 09/05/2017   Procedure: SPACE OAR INSTILLATION;  Surgeon: Irine Seal, MD;  Location[facility];  Service: Urology;  Laterality: N/A;   TOTAL ANKLE ARTHROPLASTY Right 04/20/2015   Procedure: TOTAL ANKLE ARTHOPLASTY;  Surgeon: Newt Minion, MD;  Location: [facility];  Service: Orthopedics;  Laterality: Right;   TRANSTHORACIC ECHOCARDIOGRAM  11/16/2012   ef 55-60%,  grade 1 diastolic dysfunction/  mild dilated ascending  aorta/  mild MR/ trivial TR   TRANSURETHRAL RESECTION OF PROSTATE N/A 02/03/2019   Procedure: TRANSURETHRAL RESECTION OF THE PROSTATE (TURP);  Surgeon: Irine Seal, MD;  [facility];  Service: Urology;  Laterality: N/A;   VIDEO ASSISTED THORACOSCOPY (VATS)/ LOBECTOMY Right 07/04/2015   Procedure: VIDEO ASSISTED THORACOSCOPY (VATS)/ RIGHT UPPER LOBECTOMY;  Surgeon: Melrose Nakayama, MD;  Location: [facility];  Service: Thoracic;  Laterality: Right;   VIDEO BRONCHOSCOPY N/A 06/17/2015   Procedure: VIDEO BRONCHOSCOPY;  Surgeon: Melrose Nakayama, MD;  Location: [facility];  Service: Thoracic;  Laterality: N/A;   VIDEO BRONCHOSCOPY WITH ENDOBRONCHIAL NAVIGATION N/A 06/17/2015   Procedure: VIDEO BRONCHOSCOPY WITH ENDOBRONCHIAL NAVIGATION;  Surgeon: Melrose Nakayama, MD;  Location: [facility];  Service: Thoracic;  Laterality: N/A;   VIDEO BRONCHOSCOPY WITH ENDOBRONCHIAL ULTRASOUND N/A 06/17/2015   Procedure: VIDEO BRONCHOSCOPY WITH ENDOBRONCHIAL ULTRASOUND;  Surgeon: Melrose Nakayama, MD;  Location: [facility];  Service: Thoracic;  Laterality: N/A;   Social History   Occupational History   Occupation: retired  Tobacco Use   Smoking status: Former    Packs/day: 0.50    Years: 40.00    Pack years: 20.00    Types: Cigarettes    Quit date: 02/13/2015    Years since quitting: 6.2   Smokeless tobacco: Never  Vaping Use   Vaping Use: Never used  Substance and Sexual Activity   Alcohol use: No    Alcohol/week: 0.0 standard drinks   Drug use: No   Sexual activity: Not Currently

## 2021-05-29 DIAGNOSIS — Z961 Presence of intraocular lens: Secondary | ICD-10-CM | POA: Diagnosis not present

## 2021-05-29 DIAGNOSIS — H401133 Primary open-angle glaucoma, bilateral, severe stage: Secondary | ICD-10-CM | POA: Diagnosis not present

## 2021-06-01 ENCOUNTER — Ambulatory Visit (INDEPENDENT_AMBULATORY_CARE_PROVIDER_SITE_OTHER): Payer: Medicare Other

## 2021-06-01 ENCOUNTER — Other Ambulatory Visit: Payer: Self-pay

## 2021-06-01 DIAGNOSIS — N3941 Urge incontinence: Secondary | ICD-10-CM | POA: Diagnosis not present

## 2021-06-01 NOTE — Progress Notes (Signed)
PTNS ? ?Session # 25 ? ?Health & Social Factors: Diabetes/ takes fluid pills ?Caffeine: 1 cup per day ?Alcohol: N/A ?Daytime voids #per day: 8-10 ?Night-time voids #per night: 4-5 ?Urgency: yes ?Incontinence Episodes #per day: 4 ?Ankle used: Left ?Treatment Setting: 4 ?Feeling/ Response: positive response with tingling in foot ?Comments: N/A ? ?Performed By: Levi Aland CMA, assisted by [facility] LPN ? ?Follow Up: As scheduled.  ?

## 2021-06-08 DIAGNOSIS — M25561 Pain in right knee: Secondary | ICD-10-CM | POA: Diagnosis not present

## 2021-06-08 DIAGNOSIS — Z6828 Body mass index (BMI) 28.0-28.9, adult: Secondary | ICD-10-CM | POA: Diagnosis not present

## 2021-06-08 DIAGNOSIS — M170 Bilateral primary osteoarthritis of knee: Secondary | ICD-10-CM | POA: Diagnosis not present

## 2021-06-13 ENCOUNTER — Ambulatory Visit: Payer: Medicare Other | Admitting: Orthopedic Surgery

## 2021-06-15 ENCOUNTER — Other Ambulatory Visit: Payer: Non-veteran care

## 2021-06-22 ENCOUNTER — Encounter: Payer: Self-pay | Admitting: Urology

## 2021-06-22 ENCOUNTER — Ambulatory Visit (INDEPENDENT_AMBULATORY_CARE_PROVIDER_SITE_OTHER): Payer: Medicare Other | Admitting: Urology

## 2021-06-22 VITALS — BP 133/80 | HR 85

## 2021-06-22 DIAGNOSIS — R9721 Rising PSA following treatment for malignant neoplasm of prostate: Secondary | ICD-10-CM

## 2021-06-22 DIAGNOSIS — N5201 Erectile dysfunction due to arterial insufficiency: Secondary | ICD-10-CM | POA: Diagnosis not present

## 2021-06-22 DIAGNOSIS — Z8546 Personal history of malignant neoplasm of prostate: Secondary | ICD-10-CM

## 2021-06-22 DIAGNOSIS — R350 Frequency of micturition: Secondary | ICD-10-CM | POA: Diagnosis not present

## 2021-06-22 DIAGNOSIS — N3941 Urge incontinence: Secondary | ICD-10-CM | POA: Diagnosis not present

## 2021-06-22 LAB — MICROSCOPIC EXAMINATION
Bacteria, UA: NONE SEEN
Epithelial Cells (non renal): NONE SEEN /hpf (ref 0–10)
Renal Epithel, UA: NONE SEEN /hpf
WBC, UA: NONE SEEN /hpf (ref 0–5)

## 2021-06-22 LAB — URINALYSIS, ROUTINE W REFLEX MICROSCOPIC
Bilirubin, UA: NEGATIVE
Glucose, UA: NEGATIVE
Ketones, UA: NEGATIVE
Leukocytes,UA: NEGATIVE
Nitrite, UA: NEGATIVE
Protein,UA: NEGATIVE
Specific Gravity, UA: 1.025 (ref 1.005–1.030)
Urobilinogen, Ur: 0.2 mg/dL (ref 0.2–1.0)
pH, UA: 6 (ref 5.0–7.5)

## 2021-06-22 MED ORDER — GEMTESA 75 MG PO TABS: 75.0000 mg | ORAL_TABLET | Freq: Every day | ORAL | 3 refills | Status: DC

## 2021-06-22 MED ORDER — VIAGRA 100 MG PO TABS: 100.0000 mg | ORAL_TABLET | Freq: Every day | ORAL | 11 refills | Status: DC | PRN

## 2021-06-22 MED ORDER — TROSPIUM CHLORIDE 20 MG PO TABS: 20.0000 mg | ORAL_TABLET | Freq: Two times a day (BID) | ORAL | 11 refills | Status: DC

## 2021-06-22 NOTE — Progress Notes (Signed)
?Subjective: ? ?1. History of prostate cancer   ?2. Rising PSA following treatment for malignant neoplasm of prostate   ?3. Urge incontinence   ?4. Urinary frequency   ?5. Erectile dysfunction due to arterial insufficiency   ? ?06/22/21: Jonathan [name] returns today in f/u. He has a history of T3b N0 M0 Gleason 9 prostate cancer that was treated with EXRT and ADT.   He completed EXRT in 9/19.   His final  Lupron injection was given 1/21. His PSA was up to 0.6 in November from an undetectible level.  He has no bone pain or weight loss.  His testosterone level was 310 which was up from 286 in May.   He had a lumbar laminectomy in January and he is recovering from that with improvement in his symptoms.   He continues to have OAB wet and is using depends about 4 daily.   He has been on the PTNS maintenance therapy.  He is out of Gemtesa and his symptoms have worsened with increased nocturia.   He has ED and is using Viagra.  He is not using the injections.  He has had no hematuria or dysuria.   His IPSS is 24.  ?  ?02/09/21: [name] returns today in f/u.  His PSA was <0.1 on 07/28/20.  He didn't get labs prior to the visit.  He has OAB wet and is has been on PTNS maintenance and Gemtesa samples..  He has frequent small voids.  He has nocturia x 3. UA is clear today.   He isn't having accidents. He has some dysuria but no hematuria.   He is getting PTNS today.   He has tadalafil and viagra that he uses prn.  He was given prostin and started using it at home and has had success, but he has been given the med by the [place] and he says the needles are too big.  He does have success with the viagra.     ?  ?  ?   ? IPSS   ? ? [place] Name 06/22/21 1000  ?  ?  ?  ? International Prostate Symptom Score  ? How often have you had the sensation of not emptying your bladder? More than half the time    ? How often have you had to urinate less than every two hours? Less than half the time    ? How often have you found you stopped and started again  several times when you urinated? Almost always    ? How often have you found it difficult to postpone urination? More than half the time    ? How often have you had a weak urinary stream? About half the time    ? How often have you had to strain to start urination? Not at All    ? How many times did you typically get up at night to urinate? 3 Times    ? Total IPSS Score 21    ?  ? Quality of Life due to urinary symptoms  ? If you were to spend the rest of your life with your urinary condition just the way it is now how would you feel about that? Unhappy    ? ?  ?  ? ?  ? ? ? ?ROS: ? ?ROS:  ?A complete review of systems was performed.  All systems are negative except for pertinent findings as noted.  ? ?Review of Systems  ?Constitutional:  Positive for malaise/fatigue.  ?Eyes:  Positive for blurred vision.  ?Cardiovascular:  Positive for leg swelling.  ?Musculoskeletal:  Positive for back pain and joint pain.  ?Skin:  Positive for rash.  ?Neurological:  Positive for dizziness and weakness.  ?Psychiatric/Behavioral:  Positive for depression and memory loss. The patient is nervous/anxious.   ? ?Allergies  ?Allergen Reactions  ? Ampicillin Other (See Comments)  ?  Don't work  ? Ciprofloxacin Other (See Comments)  ?  Doesn't work  ? Levaquin [Levofloxacin] Other (See Comments)  ?  Doesn't work  ? ? ?Outpatient Encounter Medications as of 06/22/2021  ?Medication Sig  ? albuterol (PROVENTIL) (2.5 MG/3ML) 0.083% nebulizer solution Take 3 mLs (2.5 mg total) by nebulization every 6 (six) hours as needed for shortness of breath.  ? atenolol-chlorthalidone (TENORETIC) 50-25 MG tablet Take 1 tablet by mouth at bedtime.   ? capsaicin (ZOSTRIX) 0.025 % cream Apply 1 application topically daily as needed (Skin Rash).  ? diclofenac Sodium (VOLTAREN) 1 % GEL Apply 2 g topically 4 (four) times daily as needed (pain).  ? donepezil (ARICEPT) 5 MG tablet Take 5 mg by mouth daily.  ? escitalopram (LEXAPRO) 5 MG tablet Take 5 mg by mouth  daily.  ? FREESTYLE LITE test strip   ? gabapentin (NEURONTIN) 300 MG capsule Take 1 capsule (300 mg total) by mouth 2 (two) times daily. (Patient taking differently: Take 800 mg by mouth 2 (two) times daily.)  ? hydroquinone 4 % cream Apply 1 application topically daily as needed (Skin Rash).  ? ibuprofen (ADVIL) 600 MG tablet Take 600 mg by mouth every 6 (six) hours as needed for headache.  ? Incontinence Supply Disposable (FITTED BRIEFS MAXIMUM XL) MISC Use briefs as needed daily for incontinence  ? Lancets (FREESTYLE) lancets   ? LANTUS SOLOSTAR 100 UNIT/ML Solostar Pen Inject 2.5 Units into the skin at bedtime.  ? latanoprost (XALATAN) 0.005 % ophthalmic solution Place 1 drop into both eyes at bedtime.  ? lidocaine (LIDODERM) 5 % Place 1 patch onto the skin daily. Remove & Discard patch within 12 hours or as directed by MD  ? LINZESS 72 MCG capsule Take 72 mcg by mouth daily before breakfast.  ? meclizine (ANTIVERT) 12.5 MG tablet Take 6.25-12.5 mg by mouth every 6 (six) hours as needed for dizziness.  ? meloxicam (MOBIC) 7.5 MG tablet Take 1 tablet (7.5 mg total) by mouth daily.  ? methocarbamol (ROBAXIN) 500 MG tablet Take 1 tablet (500 mg total) by mouth 2 (two) times daily as needed for muscle spasms.  ? metoCLOPramide (REGLAN) 5 MG tablet Take 5 mg by mouth daily as needed for vomiting or nausea.  ? mupirocin cream (BACTROBAN) 2 % Apply 1 application topically 2 (two) times daily as needed (wound care).  ? mupirocin ointment (BACTROBAN) 2 % Apply 1 application topically 2 (two) times daily as needed (Rash).  ? nystatin ointment (MYCOSTATIN) 1 application daily as needed (Skin Rash).  ? nystatin-triamcinolone ointment (MYCOLOG) Apply 1 application topically 2 (two) times daily. To groins  ? Oxycodone HCl 20 MG TABS Take 4 mg by mouth See admin instructions. Five times a day as needed  ? pantoprazole (PROTONIX) 20 MG tablet Take 20 mg by mouth daily as needed for heartburn or indigestion.  ? predniSONE  (DELTASONE) 20 MG tablet Take 20 mg by mouth daily as needed (unknown).  ? RHOPRESSA 0.02 % SOLN Place 1 drop into the right eye at bedtime.  ? ROCKLATAN 0.02-0.005 % SOLN Place 1 drop into both eyes at  bedtime.  ? SPIRIVA HANDIHALER 18 MCG inhalation capsule 18 mcg 2 (two) times daily.  ? tadalafil (CIALIS) 5 MG tablet Take 1 tablet (5 mg total) by mouth daily as needed for erectile dysfunction.  ? terbinafine (LAMISIL) 250 MG tablet Take 250 mg by mouth 2 (two) times daily.  ? timolol (TIMOPTIC) 0.5 % ophthalmic solution Place 1 drop into both eyes 2 (two) times daily as needed (eye pressure).  ? trospium (SANCTURA) 20 MG tablet Take 1 tablet (20 mg total) by mouth 2 (two) times daily.  ? Vibegron (GEMTESA) 75 MG TABS Take 75 mg by mouth daily.  ? [DISCONTINUED] tamsulosin (FLOMAX) 0.4 MG CAPS capsule Take 0.4 mg by mouth daily after breakfast.  ? [DISCONTINUED] VIAGRA 100 MG tablet Take 1 tablet (100 mg total) by mouth daily as needed for erectile dysfunction.  ? furosemide (LASIX) 20 MG tablet Take 1 tablet (20 mg total) by mouth daily for 7 days.  ? psyllium (METAMUCIL SMOOTH TEXTURE) 58.6 % powder Take 1 packet by mouth daily. (Patient not taking: Reported on 03/22/2021)  ? VIAGRA 100 MG tablet Take 1 tablet (100 mg total) by mouth daily as needed for erectile dysfunction.  ? [DISCONTINUED] alprostadil (EDEX) 20 MCG injection 20 mcg by Intracavitary route as needed for erectile dysfunction. use no more than 3 times per week (Patient not taking: Reported on 03/22/2021)  ? [DISCONTINUED] alprostadil (PROSTIN VR) 500 MCG/ML injection Alprostadil to 55mcg/ml, inject 24ml intracavernosal prn no more than once daily, 2-3x weekly.  Disp 5 - prefilled 1 ml syringe or a 91ml vial with a box of insulin syringes. (Patient not taking: Reported on 03/22/2021)  ? [DISCONTINUED] Vibegron (GEMTESA) 75 MG TABS Take 1 tablet by mouth daily. (Patient not taking: Reported on 03/22/2021)  ? ?No facility-administered encounter  medications on file as of 06/22/2021.  ? ? ?Past Medical History:  ?Diagnosis Date  ? Acquired bladder diverticulum   ? 12/ 2012  resection diverticulum done at [facility] in [place], [place]  ? Age-related cataract of right ey

## 2021-07-03 ENCOUNTER — Ambulatory Visit: Payer: TRICARE For Life (TFL)

## 2021-07-04 ENCOUNTER — Ambulatory Visit: Payer: TRICARE For Life (TFL)

## 2021-07-04 ENCOUNTER — Ambulatory Visit: Payer: TRICARE For Life (TFL) | Admitting: Physician Assistant

## 2021-07-10 ENCOUNTER — Ambulatory Visit (INDEPENDENT_AMBULATORY_CARE_PROVIDER_SITE_OTHER): Payer: Medicare Other | Admitting: Physician Assistant

## 2021-07-10 DIAGNOSIS — R3915 Urgency of urination: Secondary | ICD-10-CM | POA: Diagnosis not present

## 2021-07-10 NOTE — Progress Notes (Signed)
PTNS ? ?Session # 26 of 23 ? ?Health & Social Factors: CHF, Diabetes ?Caffeine: 1 cup ?Alcohol: none ?Daytime voids #per day: 8 ?Night-time voids #per night: 4 ?Urgency: yes ?Incontinence Episodes #per day: 6 times ?Ankle used: right ?Treatment Setting: 6 ?Feeling/ Response: positive tingling  ?Comments: none ? ?Performed By: Estill Bamberg RN ? ?Follow Up: 1 month PTNS ? ?

## 2021-07-11 NOTE — Progress Notes (Signed)
? ? ?Patient: Jonathan [name] ?Date of Birth: 1940-05-18 ? ?Reason for Visit: Follow up for severe peripheral neuropathy, mild memory disturbance ?History from: Patient ?Primary Neurologist: Dr. Leta Baptist ? ?ASSESSMENT AND PLAN ?81 y.o. year old male  ? ?Severe Diabetic Peripheral Neuropathy  ?Gait Disturbance ?-[facility] ?-Continue to utilize modifications and assistive devices to prevent falls  ?-On gabapentin from [place] ?-We talked about driving, I would recommend against him driving alone given his severe neuropathy and eye issues ? ?3. Post Lumbar decompression surgery  ?-Jan 2023, continue follow-up with Dr. Christella Noa ? ?4. Mild memory disturbance ?-Already on Aricept  ?-MOCA 25/30  ?-PCP can follow memory overtime ?-Return here as needed ? ?HISTORY OF PRESENT ILLNESS: ?Today 07/12/21 ?[name] here today for follow-up.  RPR, B12, sed rate were normal in October 2022.  He is on Aricept.  MOCA 25/30. Takes gabapentin at night for neuropathy. Jan 2023 had lumbar decompression U9-3, uncomplicated, pain to lower back much improved. Wears back brace. Did home PT. Uses electric scooter. Has ramp into the home. Has bilateral knee hinge braces, but needs new ones, Velcro is failing, also has bilateral AFO braces. Gets diabetic shoes. All supplies come from [place]. 2 falls in the last week from not wearing braces. Lives with his wife, but she works during the day. She manages his medications. Memory comes and goes. Has to leave himself reminders. Takes gabapentin 800 mg at bedtime for back/neuropathy pain, helps him sleep. He had anxiety with conversion of his vehicle when he went for training.  ? ?HISTORY  ?01/06/2021 Dr. Jannifer Franklin: [name] is a 81 year old right-handed black male with a history of diabetes with a severe diabetic peripheral neuropathy.  The patient also has had some problems with low back pain and neck discomfort, he has been followed through Dr. Christella Noa.  He  indicates that he will be having an MRI of the lumbar spine in the near future.  He was seen through this office in October 2021 and nerve conduction studies documented a severe peripheral neuropathy and a significant right ulnar neuropathy.  The patient is on some gabapentin for discomfort in the feet at night taking 800 mg.  He uses a walker for ambulation.  He is sent to this office for reports of dizziness.  Upon questioning, it appears that the "dizziness" is actually a sensation of imbalance when he is standing.  If he reaches over and touches his walker or touches a wall or furniture, the dizziness disappears.  The patient denies any recent falls.  He is interested in getting hand controls for his car so he can operate a motor vehicle.  He does report a 2-year history of some memory changes, he currently is on Aricept.  The patient says he has difficulty with short-term memory, he may misplace things about the house.  He denies any significant issues with directions with driving but he requires assistance with keeping up with appointments, medications, and he needs help with bathing and dressing.  He comes to the office today for further evaluation.  He has had a recent MRI of the brain that shows mild small vessel disease. ? ?REVIEW OF SYSTEMS: Out of a complete 14 system review of symptoms, the patient complains only of the following symptoms, and all other reviewed systems are negative. ? ?See HPI ? ?ALLERGIES: ?Allergies  ?Allergen Reactions  ? Ampicillin Other (See Comments)  ?  Don't work  ? Ciprofloxacin  Other (See Comments)  ?  Doesn't work  ? Levaquin [Levofloxacin] Other (See Comments)  ?  Doesn't work  ? ? ?HOME MEDICATIONS: ?Outpatient Medications Prior to Visit  ?Medication Sig Dispense Refill  ? albuterol (PROVENTIL) (2.5 MG/3ML) 0.083% nebulizer solution Take 3 mLs (2.5 mg total) by nebulization every 6 (six) hours as needed for shortness of breath. 75 mL 1  ? atenolol-chlorthalidone  (TENORETIC) 50-25 MG tablet Take 1 tablet by mouth at bedtime.     ? capsaicin (ZOSTRIX) 0.025 % cream Apply 1 application topically daily as needed (Skin Rash).    ? diclofenac Sodium (VOLTAREN) 1 % GEL Apply 2 g topically 4 (four) times daily as needed (pain).    ? donepezil (ARICEPT) 5 MG tablet Take 5 mg by mouth daily.    ? escitalopram (LEXAPRO) 5 MG tablet Take 5 mg by mouth daily.    ? FREESTYLE LITE test strip     ? gabapentin (NEURONTIN) 800 MG tablet Take 800 mg by mouth daily.    ? hydroquinone 4 % cream Apply 1 application topically daily as needed (Skin Rash).    ? ibuprofen (ADVIL) 600 MG tablet Take 600 mg by mouth every 6 (six) hours as needed for headache.    ? Incontinence Supply Disposable (FITTED BRIEFS MAXIMUM XL) MISC Use briefs as needed daily for incontinence 1 each 11  ? Lancets (FREESTYLE) lancets     ? LANTUS SOLOSTAR 100 UNIT/ML Solostar Pen Inject 2.5 Units into the skin at bedtime.    ? latanoprost (XALATAN) 0.005 % ophthalmic solution Place 1 drop into both eyes at bedtime.    ? lidocaine (LIDODERM) 5 % Place 1 patch onto the skin daily. Remove & Discard patch within 12 hours or as directed by MD 30 patch 0  ? LINZESS 72 MCG capsule Take 72 mcg by mouth daily before breakfast.    ? meclizine (ANTIVERT) 12.5 MG tablet Take 6.25-12.5 mg by mouth every 6 (six) hours as needed for dizziness.    ? meloxicam (MOBIC) 7.5 MG tablet Take 1 tablet (7.5 mg total) by mouth daily. 14 tablet 0  ? methocarbamol (ROBAXIN) 500 MG tablet Take 1 tablet (500 mg total) by mouth 2 (two) times daily as needed for muscle spasms. 20 tablet 0  ? metoCLOPramide (REGLAN) 5 MG tablet Take 5 mg by mouth daily as needed for vomiting or nausea.    ? mupirocin cream (BACTROBAN) 2 % Apply 1 application topically 2 (two) times daily as needed (wound care).    ? mupirocin ointment (BACTROBAN) 2 % Apply 1 application topically 2 (two) times daily as needed (Rash).    ? nystatin ointment (MYCOSTATIN) 1 application daily  as needed (Skin Rash).    ? nystatin-triamcinolone ointment (MYCOLOG) Apply 1 application topically 2 (two) times daily. To groins 30 g 11  ? Oxycodone HCl 20 MG TABS Take 4 mg by mouth See admin instructions. Five times a day as needed  0  ? pantoprazole (PROTONIX) 20 MG tablet Take 20 mg by mouth daily as needed for heartburn or indigestion.    ? predniSONE (DELTASONE) 20 MG tablet Take 20 mg by mouth daily as needed (unknown).    ? psyllium (METAMUCIL SMOOTH TEXTURE) 58.6 % powder Take 1 packet by mouth daily.    ? RHOPRESSA 0.02 % SOLN Place 1 drop into the right eye at bedtime.    ? ROCKLATAN 0.02-0.005 % SOLN Place 1 drop into both eyes at bedtime.    ?  SPIRIVA HANDIHALER 18 MCG inhalation capsule 18 mcg 2 (two) times daily.    ? tadalafil (CIALIS) 5 MG tablet Take 1 tablet (5 mg total) by mouth daily as needed for erectile dysfunction. 90 tablet 3  ? terbinafine (LAMISIL) 250 MG tablet Take 250 mg by mouth 2 (two) times daily.    ? timolol (TIMOPTIC) 0.5 % ophthalmic solution Place 1 drop into both eyes 2 (two) times daily as needed (eye pressure).    ? trospium (SANCTURA) 20 MG tablet Take 1 tablet (20 mg total) by mouth 2 (two) times daily. 180 tablet 11  ? VIAGRA 100 MG tablet Take 1 tablet (100 mg total) by mouth daily as needed for erectile dysfunction. 30 tablet 11  ? Vibegron (GEMTESA) 75 MG TABS Take 75 mg by mouth daily. 90 tablet 3  ? gabapentin (NEURONTIN) 300 MG capsule Take 1 capsule (300 mg total) by mouth 2 (two) times daily. (Patient taking differently: Take 800 mg by mouth 2 (two) times daily.) 90 capsule 11  ? furosemide (LASIX) 20 MG tablet Take 1 tablet (20 mg total) by mouth daily for 7 days. 7 tablet 0  ? ?No facility-administered medications prior to visit.  ? ? ?PAST MEDICAL HISTORY: ?Past Medical History:  ?Diagnosis Date  ? Acquired bladder diverticulum   ? 12/ 2012  resection diverticulum done at [facility] in [place], [place]  ? Age-related cataract of right eye   ? scheduled for catarat  extraction 09-12-2017  ? Agent orange exposure   ? Aneurysm of infrarenal abdominal aorta (HCC)   ? first dx 2017--- last CT 06-11-2017  measures 3.5cm  ? Anxiety   ? Chronic constipation   ? COPD with emphysema ([place]

## 2021-07-12 ENCOUNTER — Ambulatory Visit (INDEPENDENT_AMBULATORY_CARE_PROVIDER_SITE_OTHER): Payer: Medicare Other | Admitting: Neurology

## 2021-07-12 ENCOUNTER — Encounter: Payer: Self-pay | Admitting: Neurology

## 2021-07-12 VITALS — BP 120/77 | HR 83

## 2021-07-12 DIAGNOSIS — R269 Unspecified abnormalities of gait and mobility: Secondary | ICD-10-CM | POA: Diagnosis not present

## 2021-07-12 DIAGNOSIS — R413 Other amnesia: Secondary | ICD-10-CM | POA: Diagnosis not present

## 2021-07-12 DIAGNOSIS — E1142 Type 2 diabetes mellitus with diabetic polyneuropathy: Secondary | ICD-10-CM

## 2021-07-12 NOTE — Patient Instructions (Signed)
Great to see you today  ?I will provide you a letter about your braces ?Recommend against driving solo  ?Be careful not to fall  ?Continue to see your primary care doctor ?Return here as needed  ?

## 2021-07-20 DIAGNOSIS — M5126 Other intervertebral disc displacement, lumbar region: Secondary | ICD-10-CM | POA: Diagnosis not present

## 2021-07-20 DIAGNOSIS — Z6830 Body mass index (BMI) 30.0-30.9, adult: Secondary | ICD-10-CM | POA: Diagnosis not present

## 2021-07-20 DIAGNOSIS — M48061 Spinal stenosis, lumbar region without neurogenic claudication: Secondary | ICD-10-CM | POA: Diagnosis not present

## 2021-07-27 ENCOUNTER — Telehealth: Payer: Self-pay | Admitting: Urology

## 2021-07-27 NOTE — Telephone Encounter (Signed)
Patient called the office today requesting a new prescription for Gemtesa to be sent to his pharmacy: [facility].   ? ?Patient is requesting that we send the prescription for 2 pills per day - that is what he has been taking and it is working well for him. ? ? ?

## 2021-07-28 NOTE — Telephone Encounter (Signed)
Returned patients call. Patient made aware that yearly rx was sent to pharmacy in March. Patient states that the pharmacy has the prescription.  ? ?

## 2021-07-30 DIAGNOSIS — R9431 Abnormal electrocardiogram [ECG] [EKG]: Secondary | ICD-10-CM | POA: Diagnosis not present

## 2021-07-30 DIAGNOSIS — R000 Tachycardia, unspecified: Secondary | ICD-10-CM | POA: Diagnosis not present

## 2021-07-30 DIAGNOSIS — I441 Atrioventricular block, second degree: Secondary | ICD-10-CM | POA: Diagnosis not present

## 2021-07-30 DIAGNOSIS — E1122 Type 2 diabetes mellitus with diabetic chronic kidney disease: Secondary | ICD-10-CM | POA: Diagnosis not present

## 2021-07-30 DIAGNOSIS — Z884 Allergy status to anesthetic agent status: Secondary | ICD-10-CM | POA: Diagnosis not present

## 2021-07-30 DIAGNOSIS — Z880 Allergy status to penicillin: Secondary | ICD-10-CM | POA: Diagnosis not present

## 2021-07-30 DIAGNOSIS — Z881 Allergy status to other antibiotic agents status: Secondary | ICD-10-CM | POA: Diagnosis not present

## 2021-07-30 DIAGNOSIS — R079 Chest pain, unspecified: Secondary | ICD-10-CM | POA: Diagnosis not present

## 2021-07-30 DIAGNOSIS — K2900 Acute gastritis without bleeding: Secondary | ICD-10-CM | POA: Diagnosis not present

## 2021-07-30 DIAGNOSIS — R0789 Other chest pain: Secondary | ICD-10-CM | POA: Diagnosis not present

## 2021-07-31 ENCOUNTER — Ambulatory Visit (INDEPENDENT_AMBULATORY_CARE_PROVIDER_SITE_OTHER): Payer: Medicare Other | Admitting: Physician Assistant

## 2021-07-31 DIAGNOSIS — R3915 Urgency of urination: Secondary | ICD-10-CM

## 2021-07-31 DIAGNOSIS — N393 Stress incontinence (female) (male): Secondary | ICD-10-CM

## 2021-07-31 NOTE — Progress Notes (Signed)
PTNS ? ?Session # 27 of 47 ? ?Health & Social Factors: Diabetes, CHF, taking fluid pills ?Caffeine: 1 cup ?Alcohol: none ?Daytime voids #per day: all day ?Night-time voids #per night: n/a ?Urgency: yes ?Incontinence Episodes #per day: n/a ?Ankle used: right ?Treatment Setting: 10 ?Feeling/ Response: feel in my foot ?Comments: n/a ? ?Performed By: Estill Bamberg, RN ? ?Follow Up: as scheduled.  ?

## 2021-08-02 DIAGNOSIS — F419 Anxiety disorder, unspecified: Secondary | ICD-10-CM | POA: Diagnosis not present

## 2021-08-02 DIAGNOSIS — R079 Chest pain, unspecified: Secondary | ICD-10-CM | POA: Diagnosis not present

## 2021-08-02 DIAGNOSIS — M6281 Muscle weakness (generalized): Secondary | ICD-10-CM | POA: Diagnosis not present

## 2021-08-02 DIAGNOSIS — Z6828 Body mass index (BMI) 28.0-28.9, adult: Secondary | ICD-10-CM | POA: Diagnosis not present

## 2021-08-02 DIAGNOSIS — I10 Essential (primary) hypertension: Secondary | ICD-10-CM | POA: Diagnosis not present

## 2021-08-02 DIAGNOSIS — R2689 Other abnormalities of gait and mobility: Secondary | ICD-10-CM | POA: Diagnosis not present

## 2021-08-28 DIAGNOSIS — R42 Dizziness and giddiness: Secondary | ICD-10-CM | POA: Diagnosis not present

## 2021-08-28 DIAGNOSIS — M545 Low back pain, unspecified: Secondary | ICD-10-CM | POA: Diagnosis not present

## 2021-08-28 DIAGNOSIS — R2689 Other abnormalities of gait and mobility: Secondary | ICD-10-CM | POA: Diagnosis not present

## 2021-08-28 DIAGNOSIS — M6281 Muscle weakness (generalized): Secondary | ICD-10-CM | POA: Diagnosis not present

## 2021-08-28 DIAGNOSIS — I10 Essential (primary) hypertension: Secondary | ICD-10-CM | POA: Diagnosis not present

## 2021-08-28 DIAGNOSIS — E0865 Diabetes mellitus due to underlying condition with hyperglycemia: Secondary | ICD-10-CM | POA: Diagnosis not present

## 2021-08-28 DIAGNOSIS — M19071 Primary osteoarthritis, right ankle and foot: Secondary | ICD-10-CM | POA: Diagnosis not present

## 2021-08-28 DIAGNOSIS — E039 Hypothyroidism, unspecified: Secondary | ICD-10-CM | POA: Diagnosis not present

## 2021-08-28 DIAGNOSIS — R531 Weakness: Secondary | ICD-10-CM | POA: Diagnosis not present

## 2021-08-28 DIAGNOSIS — R4189 Other symptoms and signs involving cognitive functions and awareness: Secondary | ICD-10-CM | POA: Diagnosis not present

## 2021-08-28 DIAGNOSIS — M792 Neuralgia and neuritis, unspecified: Secondary | ICD-10-CM | POA: Diagnosis not present

## 2021-08-28 DIAGNOSIS — Z85118 Personal history of other malignant neoplasm of bronchus and lung: Secondary | ICD-10-CM | POA: Diagnosis not present

## 2021-08-28 DIAGNOSIS — K5901 Slow transit constipation: Secondary | ICD-10-CM | POA: Diagnosis not present

## 2021-08-28 DIAGNOSIS — J701 Chronic and other pulmonary manifestations due to radiation: Secondary | ICD-10-CM | POA: Diagnosis not present

## 2021-09-01 ENCOUNTER — Telehealth: Payer: Self-pay

## 2021-09-01 ENCOUNTER — Other Ambulatory Visit: Payer: Self-pay

## 2021-09-01 DIAGNOSIS — N5201 Erectile dysfunction due to arterial insufficiency: Secondary | ICD-10-CM

## 2021-09-01 MED ORDER — TADALAFIL 5 MG PO TABS: 5.0000 mg | ORAL_TABLET | Freq: Every day | ORAL | 3 refills | Status: DC | PRN

## 2021-09-01 NOTE — Telephone Encounter (Signed)
Refill submitted. 

## 2021-09-01 NOTE — Telephone Encounter (Signed)
Patient called advising he needed a refill on medication.    Medication: tadalafil (CIALIS) 5 MG tablet   Pharmacy: [place], [place] - [address]

## 2021-09-05 DIAGNOSIS — C61 Malignant neoplasm of prostate: Secondary | ICD-10-CM | POA: Diagnosis not present

## 2021-09-05 DIAGNOSIS — R0602 Shortness of breath: Secondary | ICD-10-CM | POA: Diagnosis not present

## 2021-09-05 DIAGNOSIS — Z85118 Personal history of other malignant neoplasm of bronchus and lung: Secondary | ICD-10-CM | POA: Diagnosis not present

## 2021-09-05 DIAGNOSIS — I700 Atherosclerosis of aorta: Secondary | ICD-10-CM | POA: Diagnosis not present

## 2021-09-05 DIAGNOSIS — J701 Chronic and other pulmonary manifestations due to radiation: Secondary | ICD-10-CM | POA: Diagnosis not present

## 2021-09-05 DIAGNOSIS — C3490 Malignant neoplasm of unspecified part of unspecified bronchus or lung: Secondary | ICD-10-CM | POA: Diagnosis not present

## 2021-09-05 DIAGNOSIS — I272 Pulmonary hypertension, unspecified: Secondary | ICD-10-CM | POA: Diagnosis not present

## 2021-09-07 ENCOUNTER — Other Ambulatory Visit: Payer: Medicare Other

## 2021-09-07 DIAGNOSIS — Z884 Allergy status to anesthetic agent status: Secondary | ICD-10-CM | POA: Diagnosis not present

## 2021-09-07 DIAGNOSIS — Z880 Allergy status to penicillin: Secondary | ICD-10-CM | POA: Diagnosis not present

## 2021-09-07 DIAGNOSIS — R079 Chest pain, unspecified: Secondary | ICD-10-CM | POA: Diagnosis not present

## 2021-09-07 DIAGNOSIS — R0789 Other chest pain: Secondary | ICD-10-CM | POA: Diagnosis not present

## 2021-09-07 DIAGNOSIS — E1122 Type 2 diabetes mellitus with diabetic chronic kidney disease: Secondary | ICD-10-CM | POA: Diagnosis not present

## 2021-09-07 DIAGNOSIS — R9721 Rising PSA following treatment for malignant neoplasm of prostate: Secondary | ICD-10-CM | POA: Diagnosis not present

## 2021-09-07 DIAGNOSIS — R000 Tachycardia, unspecified: Secondary | ICD-10-CM | POA: Diagnosis not present

## 2021-09-07 DIAGNOSIS — Z8546 Personal history of malignant neoplasm of prostate: Secondary | ICD-10-CM

## 2021-09-07 DIAGNOSIS — N189 Chronic kidney disease, unspecified: Secondary | ICD-10-CM | POA: Diagnosis not present

## 2021-09-07 DIAGNOSIS — K2900 Acute gastritis without bleeding: Secondary | ICD-10-CM | POA: Diagnosis not present

## 2021-09-07 DIAGNOSIS — R9431 Abnormal electrocardiogram [ECG] [EKG]: Secondary | ICD-10-CM | POA: Diagnosis not present

## 2021-09-07 DIAGNOSIS — Z7982 Long term (current) use of aspirin: Secondary | ICD-10-CM | POA: Diagnosis not present

## 2021-09-07 DIAGNOSIS — Z7984 Long term (current) use of oral hypoglycemic drugs: Secondary | ICD-10-CM | POA: Diagnosis not present

## 2021-09-08 LAB — TESTOSTERONE: Testosterone: 313 ng/dL (ref 264–916)

## 2021-09-08 LAB — PSA: Prostate Specific Ag, Serum: <0.1 ng/mL (ref 0.0–4.0)

## 2021-09-12 ENCOUNTER — Other Ambulatory Visit (HOSPITAL_COMMUNITY): Payer: Self-pay | Admitting: Family Medicine

## 2021-09-12 ENCOUNTER — Telehealth: Payer: Self-pay

## 2021-09-12 DIAGNOSIS — Z85118 Personal history of other malignant neoplasm of bronchus and lung: Secondary | ICD-10-CM

## 2021-09-12 DIAGNOSIS — J701 Chronic and other pulmonary manifestations due to radiation: Secondary | ICD-10-CM

## 2021-09-12 NOTE — Telephone Encounter (Signed)
Patient called advising that the [facility] stated they did not have any refills on Viagra. To resend it so they can refill for patient.   Medication: VIAGRA 100 MG tablet     Thank you

## 2021-09-13 NOTE — Telephone Encounter (Signed)
Left detailed message informing patient of refills, to call back if still an issue

## 2021-09-14 ENCOUNTER — Ambulatory Visit: Payer: TRICARE For Life (TFL) | Admitting: Urology

## 2021-09-14 ENCOUNTER — Ambulatory Visit (INDEPENDENT_AMBULATORY_CARE_PROVIDER_SITE_OTHER): Payer: Medicare Other | Admitting: Physician Assistant

## 2021-09-14 DIAGNOSIS — N3941 Urge incontinence: Secondary | ICD-10-CM | POA: Diagnosis not present

## 2021-09-14 NOTE — Progress Notes (Signed)
PTNS  Session # 28 of 45  Health & Social Factors: diabetes, taking fluid pills Caffeine: 1 cup of coffee Alcohol: none Daytime voids #per day: 8 Night-time voids #per night: 4 Urgency: yes Incontinence Episodes #per day: 4 Ankle used: right Treatment Setting: 11 Feeling/ Response: feeling in foot and leg Comments: n/a  Performed By: Levi Aland, CMA  Follow Up: follow up as scheduled.

## 2021-09-21 DIAGNOSIS — R296 Repeated falls: Secondary | ICD-10-CM | POA: Diagnosis not present

## 2021-09-21 DIAGNOSIS — R030 Elevated blood-pressure reading, without diagnosis of hypertension: Secondary | ICD-10-CM | POA: Diagnosis not present

## 2021-09-21 DIAGNOSIS — R300 Dysuria: Secondary | ICD-10-CM | POA: Diagnosis not present

## 2021-09-21 DIAGNOSIS — Z6827 Body mass index (BMI) 27.0-27.9, adult: Secondary | ICD-10-CM | POA: Diagnosis not present

## 2021-09-21 DIAGNOSIS — M25561 Pain in right knee: Secondary | ICD-10-CM | POA: Diagnosis not present

## 2021-09-21 DIAGNOSIS — Z85118 Personal history of other malignant neoplasm of bronchus and lung: Secondary | ICD-10-CM | POA: Diagnosis not present

## 2021-09-21 DIAGNOSIS — J701 Chronic and other pulmonary manifestations due to radiation: Secondary | ICD-10-CM | POA: Diagnosis not present

## 2021-09-22 ENCOUNTER — Other Ambulatory Visit: Payer: Self-pay | Admitting: Family Medicine

## 2021-09-22 DIAGNOSIS — J701 Chronic and other pulmonary manifestations due to radiation: Secondary | ICD-10-CM

## 2021-09-22 DIAGNOSIS — Z85118 Personal history of other malignant neoplasm of bronchus and lung: Secondary | ICD-10-CM

## 2021-09-28 ENCOUNTER — Other Ambulatory Visit: Payer: Self-pay | Admitting: Family Medicine

## 2021-09-28 DIAGNOSIS — J701 Chronic and other pulmonary manifestations due to radiation: Secondary | ICD-10-CM

## 2021-09-28 DIAGNOSIS — Z85118 Personal history of other malignant neoplasm of bronchus and lung: Secondary | ICD-10-CM

## 2021-09-29 ENCOUNTER — Other Ambulatory Visit: Payer: Self-pay | Admitting: Family Medicine

## 2021-09-29 DIAGNOSIS — Z85118 Personal history of other malignant neoplasm of bronchus and lung: Secondary | ICD-10-CM

## 2021-09-29 DIAGNOSIS — J701 Chronic and other pulmonary manifestations due to radiation: Secondary | ICD-10-CM

## 2021-10-04 ENCOUNTER — Encounter
Admission: RE | Admit: 2021-10-04 | Discharge: 2021-10-04 | Disposition: A | Discharge status: Home / Self Care | Payer: Medicare Other | Source: Ambulatory Visit | Attending: Family Medicine | Admitting: Family Medicine

## 2021-10-04 DIAGNOSIS — J701 Chronic and other pulmonary manifestations due to radiation: Secondary | ICD-10-CM | POA: Diagnosis not present

## 2021-10-04 DIAGNOSIS — Z85118 Personal history of other malignant neoplasm of bronchus and lung: Secondary | ICD-10-CM

## 2021-10-04 DIAGNOSIS — Z8546 Personal history of malignant neoplasm of prostate: Secondary | ICD-10-CM | POA: Diagnosis not present

## 2021-10-04 LAB — GLUCOSE, CAPILLARY: Glucose-Capillary: 119 mg/dL — ABNORMAL HIGH (ref 70–99)

## 2021-10-04 MED ORDER — FLUDEOXYGLUCOSE F - 18 (FDG) INJECTION
13.4000 | Freq: Once | INTRAVENOUS | Status: AC | PRN
  Administered 2021-10-04: 14.03 via INTRAVENOUS

## 2021-10-12 ENCOUNTER — Ambulatory Visit (INDEPENDENT_AMBULATORY_CARE_PROVIDER_SITE_OTHER): Payer: Medicare Other | Admitting: Urology

## 2021-10-12 ENCOUNTER — Encounter: Payer: Self-pay | Admitting: Urology

## 2021-10-12 VITALS — BP 123/72 | HR 106

## 2021-10-12 DIAGNOSIS — N3941 Urge incontinence: Secondary | ICD-10-CM | POA: Diagnosis not present

## 2021-10-12 DIAGNOSIS — N390 Urinary tract infection, site not specified: Secondary | ICD-10-CM

## 2021-10-12 DIAGNOSIS — N5201 Erectile dysfunction due to arterial insufficiency: Secondary | ICD-10-CM | POA: Diagnosis not present

## 2021-10-12 DIAGNOSIS — Z8546 Personal history of malignant neoplasm of prostate: Secondary | ICD-10-CM

## 2021-10-12 DIAGNOSIS — R350 Frequency of micturition: Secondary | ICD-10-CM | POA: Diagnosis not present

## 2021-10-12 DIAGNOSIS — Z87440 Personal history of urinary (tract) infections: Secondary | ICD-10-CM

## 2021-10-12 LAB — URINALYSIS, ROUTINE W REFLEX MICROSCOPIC
Bilirubin, UA: NEGATIVE
Glucose, UA: NEGATIVE
Ketones, UA: NEGATIVE
Leukocytes,UA: NEGATIVE
Nitrite, UA: NEGATIVE
RBC, UA: NEGATIVE
Specific Gravity, UA: 1.02 (ref 1.005–1.030)
Urobilinogen, Ur: 1 mg/dL (ref 0.2–1.0)
pH, UA: 6 (ref 5.0–7.5)

## 2021-10-12 NOTE — Progress Notes (Signed)
PTNS  Session # 29 of 45  Health & Social Factors: diabetic/fluid pills Caffeine: 1 cup Alcohol: none Daytime voids #per day: varies Night-time voids #per night: varies Urgency: yes Incontinence Episodes #per day: 3 Ankle used: right Treatment Setting: 6 Feeling/ Response: positive sensation Comments: n/a  Performed By: [facility] LPN  Follow Up: keep scheduled [place]

## 2021-10-12 NOTE — Progress Notes (Signed)
Subjective:  1. History of prostate cancer   2. Urge incontinence   3. Urinary frequency   4. Recurrent UTI   5. Erectile dysfunction due to arterial insufficiency     10/12/21: Jonathan [name] is here today for PTNS.  His PSA remains <0.1.  He completed EXRT with ADT in 9/19 with the last lupron in 1/21.  His testosterone is up to 313.  His UA is clear.  He has incontinence and wears guards.  His IPSS is 25.  He has nocturia x 3.  He is using the [place] and trospium.   He continues to use Viagra for the ED.   He had a PET scan on 7/12 for f/u of his lung cancer.   There was no evidence of recurrent disease.   06/22/21: Jonathan [name] returns today in f/u. He has a history of T3b N0 M0 Gleason 9 prostate cancer that was treated with EXRT and ADT.   He completed EXRT in 9/19.   His final  Lupron injection was given 1/21. His PSA was up to 0.6 in November from an undetectible level.  He has no bone pain or weight loss.  His testosterone level was 310 which was up from 286 in May.   He had a lumbar laminectomy in January and he is recovering from that with improvement in his symptoms.   He continues to have OAB wet and is using depends about 4 daily.   He has been on the PTNS maintenance therapy.  He is out of Gemtesa and his symptoms have worsened with increased nocturia.   He has ED and is using Viagra.  He is not using the injections.  He has had no hematuria or dysuria.   His IPSS is 24.    02/09/21: Jonathan [name] returns today in f/u.  His PSA was <0.1 on 07/28/20.  He didn't get labs prior to the visit.  He has OAB wet and is has been on PTNS maintenance and Gemtesa samples..  He has frequent small voids.  He has nocturia x 3. UA is clear today.   He isn't having accidents. He has some dysuria but no hematuria.   He is getting PTNS today.   He has tadalafil and viagra that he uses prn.  He was given prostin and started using it at home and has had success, but he has been given the med by the [place] and he says the needles are  too big.  He does have success with the viagra.             IPSS     Row Name 10/12/21 1500         International Prostate Symptom Score   How often have you had the sensation of not emptying your bladder? More than half the time     How often have you had to urinate less than every two hours? More than half the time     How often have you found you stopped and started again several times when you urinated? About half the time     How often have you found it difficult to postpone urination? About half the time     How often have you had a weak urinary stream? More than half the time     How often have you had to strain to start urination? More than half the time     How many times did you typically get up at night to urinate? 3 Times  Total IPSS Score 25       Quality of Life due to urinary symptoms   If you were to spend the rest of your life with your urinary condition just the way it is now how would you feel about that? Mostly Satisfied                   ROS:  ROS:  A complete review of systems was performed.  All systems are negative except for pertinent findings as noted.   Review of Systems  Constitutional:  Positive for malaise/fatigue.  Eyes:  Positive for blurred vision.  Cardiovascular:  Positive for leg swelling.  Musculoskeletal:  Positive for back pain and joint pain.  Skin:  Positive for itching and rash.  Neurological:  Positive for dizziness and weakness.  Psychiatric/Behavioral:  Positive for depression and memory loss. The patient is nervous/anxious.     Allergies  Allergen Reactions   Ampicillin Other (See Comments)    Don't work   Ciprofloxacin Other (See Comments)    Doesn't work   Levaquin [Levofloxacin] Other (See Comments)    Doesn't work    Outpatient Encounter Medications as of 10/12/2021  Medication Sig   albuterol (PROVENTIL) (2.5 MG/3ML) 0.083% nebulizer solution Take 3 mLs (2.5 mg total) by nebulization every 6 (six) hours  as needed for shortness of breath.   atenolol-chlorthalidone (TENORETIC) 50-25 MG tablet Take 1 tablet by mouth at bedtime.    atorvastatin (LIPITOR) 20 MG tablet Take 20 mg by mouth daily.   capsaicin (ZOSTRIX) 0.025 % cream Apply 1 application topically daily as needed (Skin Rash).   diclofenac Sodium (VOLTAREN) 1 % GEL Apply 2 g topically 4 (four) times daily as needed (pain).   donepezil (ARICEPT) 5 MG tablet Take 5 mg by mouth daily.   escitalopram (LEXAPRO) 5 MG tablet Take 5 mg by mouth daily.   FREESTYLE LITE test strip    furosemide (LASIX) 20 MG tablet Take 1 tablet (20 mg total) by mouth daily for 7 days.   gabapentin (NEURONTIN) 800 MG tablet Take 800 mg by mouth daily.   hydroquinone 4 % cream Apply 1 application topically daily as needed (Skin Rash).   ibuprofen (ADVIL) 600 MG tablet Take 600 mg by mouth every 6 (six) hours as needed for headache.   Incontinence Supply Disposable (FITTED BRIEFS MAXIMUM XL) MISC Use briefs as needed daily for incontinence   Lancets (FREESTYLE) lancets    LANTUS SOLOSTAR 100 UNIT/ML Solostar Pen Inject 2.5 Units into the skin at bedtime.   latanoprost (XALATAN) 0.005 % ophthalmic solution Place 1 drop into both eyes at bedtime.   lidocaine (LIDODERM) 5 % Place 1 patch onto the skin daily. Remove & Discard patch within 12 hours or as directed by MD   LINZESS 72 MCG capsule Take 72 mcg by mouth daily before breakfast.   meclizine (ANTIVERT) 12.5 MG tablet Take 6.25-12.5 mg by mouth every 6 (six) hours as needed for dizziness.   meloxicam (MOBIC) 7.5 MG tablet Take 1 tablet (7.5 mg total) by mouth daily.   methocarbamol (ROBAXIN) 500 MG tablet Take 1 tablet (500 mg total) by mouth 2 (two) times daily as needed for muscle spasms.   metoCLOPramide (REGLAN) 5 MG tablet Take 5 mg by mouth daily as needed for vomiting or nausea.   mupirocin cream (BACTROBAN) 2 % Apply 1 application topically 2 (two) times daily as needed (wound care).   mupirocin ointment  (BACTROBAN) 2 % Apply 1 application topically  2 (two) times daily as needed (Rash).   nystatin ointment (MYCOSTATIN) 1 application daily as needed (Skin Rash).   nystatin-triamcinolone ointment (MYCOLOG) Apply 1 application topically 2 (two) times daily. To groins   Oxycodone HCl 20 MG TABS Take 4 mg by mouth See admin instructions. Five times a day as needed   pantoprazole (PROTONIX) 20 MG tablet Take 20 mg by mouth daily as needed for heartburn or indigestion.   predniSONE (DELTASONE) 20 MG tablet Take 20 mg by mouth daily as needed (unknown).   psyllium (METAMUCIL SMOOTH TEXTURE) 58.6 % powder Take 1 packet by mouth daily.   RHOPRESSA 0.02 % SOLN Place 1 drop into the right eye at bedtime.   ROCKLATAN 0.02-0.005 % SOLN Place 1 drop into both eyes at bedtime.   SPIRIVA HANDIHALER 18 MCG inhalation capsule 18 mcg 2 (two) times daily.   tadalafil (CIALIS) 5 MG tablet Take 1 tablet (5 mg total) by mouth daily as needed for erectile dysfunction.   terbinafine (LAMISIL) 250 MG tablet Take 250 mg by mouth 2 (two) times daily.   timolol (TIMOPTIC) 0.5 % ophthalmic solution Place 1 drop into both eyes 2 (two) times daily as needed (eye pressure).   trospium (SANCTURA) 20 MG tablet Take 1 tablet (20 mg total) by mouth 2 (two) times daily.   VIAGRA 100 MG tablet Take 1 tablet (100 mg total) by mouth daily as needed for erectile dysfunction.   Vibegron (GEMTESA) 75 MG TABS Take 75 mg by mouth daily.   No facility-administered encounter medications on file as of 10/12/2021.    Past Medical History:  Diagnosis Date   Acquired bladder diverticulum    12/ 2012  resection diverticulum done at [facility] in [place], [place]   Age-related cataract of right eye    scheduled for catarat extraction 09-12-2017   Agent orange exposure    Aneurysm of infrarenal abdominal aorta ([place])    first dx 2017--- last CT 06-11-2017  measures 3.5cm   Anxiety    Chronic constipation    COPD with emphysema ([place])    06--12-2017   per pt no symptoms since Feb 2019   Depression    PTSD   Diabetes mellitus type 2, diet-controlled ([place])    Diabetic neuropathy ([place])    Dyslipidemia    Dyspnea on exertion    Erectile dysfunction    Feeling of incomplete bladder emptying    Gait abnormality 01/06/2020   GERD (gastroesophageal reflux disease)    Gout    09-02-2017 last flare up 3 months ago   Hiatal hernia    History of atrial fibrillation    episode post op right lung lobectomy 07-04-2015   History of bladder stone    History of colon polyps    History of DVT of lower extremity    03/ 2019  bilateral lower extremity (superficial) ---  per treated w/ oral medication    History of gastritis    History of kidney stones    History of radiation therapy 09-14-2015  to 09-21-2015   left upper lung nodule -- 54Gy in 3 fractions (18 Gy per fraction)   Hyperplasia of prostate with lower urinary tract symptoms (LUTS)    Hypertension    Lumbar spinal stenosis    Nephrolithiasis    Neurogenic bladder    Osteoarthritis    ankle, hands   Osteoporosis    Prostate cancer [facility]) urologist-  dr Megahn Killings/  oncologist-  dr Tammi Klippel   dx 05-24-2017-- Stage T2b,  Gleason 4+4,  PSA 22.41,  vol 38cc--- started ADT 04/ 2019,  plan external radiation therapy   Radiation fibrosis of lung (HCC)    hx left upper long nodule SBRT 06/ 2017   Squamous cell carcinoma of both lungs ([place]) last CT in epic dated 06-26-2017 no recurrence   dx 03/ 2017  non-small cell SCC via bronchoscopy w/ bx's by dr Roxan Hockey---  s/p  right VATS w/ right lobectomy and node dissection's 07-04-2015 /  pt had SBRT to left upper nodule Stage 1 ([facility]) completed 09-21-2015   Vertigo 2018   Wears dentures    bottom   Wears glasses     Past Surgical History:  Procedure Laterality Date   BIOPSY  05/29/2019   Procedure: BIOPSY;  Surgeon: Rogene Houston, MD;  [facility];  Service: Endoscopy;;  gastric ulcer   CARPAL TUNNEL RELEASE Right  07/09/2014   Procedure: RIGHT OPEN CARPAL TUNNEL RELEASE;  Surgeon: Mcarthur Rossetti, MD;  [facility];  Service: Orthopedics;  Laterality: Right;   CHOLECYSTECTOMY N/A 02/24/2014   Procedure: LAPAROSCOPIC CHOLECYSTECTOMY;  Surgeon: Coralie Keens, MD;  Location: [place];  Service: General;  Laterality: N/A;   COLONOSCOPY     CYSTOLITHOTOMY  11/ 2007      [facility] [place] [facility]/18/2018   Procedure: CYSTOSCOPY FLEXIBLE;  Surgeon: Irine Seal, MD;  Location[facility];  Service: Urology;  Laterality: N/A;   CYSTOSCOPY W/ LITHOLAPAXY / EHL  02-24-2007   dr Janice Norrie  [facility]   dental implant     No teeth at this time waiting for them to be made as of 01-30-19   ESOPHAGOGASTRODUODENOSCOPY (EGD) WITH PROPOFOL N/A 05/29/2019   Procedure: ESOPHAGOGASTRODUODENOSCOPY (EGD) WITH PROPOFOL;  Surgeon: Rogene Houston, MD;  [facility];  Service: Endoscopy;  Laterality: N/A;  [place] IMPLANT N/A 09/05/2017   Procedure: GOLD SEED IMPLANT;  Surgeon: Irine Seal, MD;  Location: [facility];  Service: Urology;  Laterality: N/A;   INSERTION OF SUPRAPUBIC CATHETER N/A 04/18/2018   Procedure: SUPRAPUBIC TUBE CHANGE;  Surgeon: Irine Seal, MD;  [facility];  Service: Urology;  Laterality: N/A;   IR CATHETER TUBE CHANGE  02/24/2018   LUMBAR LAMINECTOMY/DECOMPRESSION MICRODISCECTOMY Right 03/31/2021   Procedure: Bilateral Lumbar Three- Four, Lumbar Four-Five Laminectomy, Decompression;  Surgeon: Ashok Pall, MD;  Location: [place];  Service: Neurosurgery;  Laterality: Right;   SPACE OAR INSTILLATION N/A 09/05/2017   Procedure: SPACE OAR INSTILLATION;  Surgeon: Irine Seal, MD;  Location: [facility];  Service: Urology;  Laterality: N/A;   TOTAL ANKLE ARTHROPLASTY Right 04/20/2015   Procedure: TOTAL ANKLE ARTHOPLASTY;  Surgeon: Newt Minion, MD;  Location: [place];  Service: Orthopedics;  Laterality: Right;   TRANSTHORACIC ECHOCARDIOGRAM   11/16/2012   ef 55-60%,  grade 1 diastolic dysfunction/  mild dilated ascending aorta/  mild MR/ trivial TR   TRANSURETHRAL RESECTION OF PROSTATE N/A 02/03/2019   Procedure: TRANSURETHRAL RESECTION OF THE PROSTATE (TURP);  Surgeon: Irine Seal, MD;  [facility];  Service: Urology;  Laterality: N/A;   VIDEO ASSISTED THORACOSCOPY (VATS)/ LOBECTOMY Right 07/04/2015   Procedure: VIDEO ASSISTED THORACOSCOPY (VATS)/ RIGHT UPPER LOBECTOMY;  Surgeon: Melrose Nakayama, MD;  Location: [place];  Service: Thoracic;  Laterality: Right;   VIDEO BRONCHOSCOPY N/A 06/17/2015   Procedure: VIDEO BRONCHOSCOPY;  Surgeon: Melrose Nakayama, MD;  Location: [place];  Service: Thoracic;  Laterality: N/A;   VIDEO BRONCHOSCOPY WITH ENDOBRONCHIAL NAVIGATION N/A 06/17/2015   Procedure: VIDEO BRONCHOSCOPY WITH ENDOBRONCHIAL NAVIGATION;  Surgeon: Melrose Nakayama, MD;  [facility];  Service: Thoracic;  Laterality: N/A;   VIDEO BRONCHOSCOPY WITH ENDOBRONCHIAL ULTRASOUND N/A 06/17/2015   Procedure: VIDEO BRONCHOSCOPY WITH ENDOBRONCHIAL ULTRASOUND;  Surgeon: Melrose Nakayama, MD;  [facility];  Service: Thoracic;  Laterality: N/A;    Social History   Socioeconomic History   Marital status: Married    Spouse name: Jerl Santos   Number of children: 2   Years of education: college   Highest education level: Not on file  Occupational History   Occupation: retired  Tobacco Use   Smoking status: Former    Packs/day: 0.50    Years: 40.00    Total pack years: 20.00    Types: Cigarettes    Quit date: 02/13/2015    Years since quitting: 6.6   Smokeless tobacco: Never  Vaping Use   Vaping Use: Never used  Substance and Sexual Activity   Alcohol use: No    Alcohol/week: 0.0 standard drinks of alcohol   Drug use: No   Sexual activity: Not Currently  Other Topics Concern   Not on file  Social History Narrative   Lives with wife, daughter and son in law temporarily to help them out   Right Handed    Drinks caffeine occassionally   Social Determinants of Health   Financial Resource Strain: Not on file  Food Insecurity: Not on file  Transportation Needs: Not on file  Physical Activity: Not on file  Stress: Not on file  Social Connections: Not on file  Intimate Partner Violence: Not on file    Family History  Problem Relation Age of Onset   Cancer Father        Asbestos   Colon cancer Neg Hx    Colon polyps Neg Hx    Kidney disease Neg Hx    Esophageal cancer Neg Hx    Gallbladder disease Neg Hx    Heart disease Neg Hx    Diabetes Neg Hx        Objective: Vitals:   10/12/21 1452  BP: 123/72  Pulse: (!) 106     Physical Exam  Lab Results:  Results for orders placed or performed in visit on 10/12/21 (from the past 24 hour(s))  Urinalysis, Routine w reflex microscopic     Status: Abnormal   Collection Time: 10/12/21  3:33 PM  Result Value Ref Range   Specific Gravity, UA 1.020 1.005 - 1.030   pH, UA 6.0 5.0 - 7.5   Color, UA Yellow Yellow   Appearance Ur Clear Clear   Leukocytes,UA Negative Negative   Protein,UA Trace (A) Negative/Trace   Glucose, UA Negative Negative   Ketones, UA Negative Negative   RBC, UA Negative Negative   Bilirubin, UA Negative Negative   Urobilinogen, Ur 1.0 0.2 - 1.0 mg/dL   Nitrite, UA Negative Negative   Microscopic Examination Comment    Narrative   Performed at:  [address] - [place] [address], [place], [place]  [id number] Lab Director: Mina Marble MT, Phone:  [phone]    UA is clear.   BMET No results for input(s): "NA", "K", "CL", "CO2", "GLUCOSE", "BUN", "CREATININE", "CALCIUM" in the last 72 hours. PSA Lab Results  Component Value Date   PSA1 <0.1 09/07/2021   PSA1 0.6 02/09/2021   PSA1 <0.1 07/28/2020    PSA  Date Value Ref  Range Status  08/21/2019 <0.1 < OR = 4.0 ng/mL Final    Comment:    The total PSA value from this assay system is  standardized against the WHO standard. The test   result will be approximately 20% lower when compared  to the equimolar-standardized total PSA (Beckman  Coulter). Comparison of serial PSA results should be  interpreted with this fact in mind. . This test was performed using the Siemens  chemiluminescent method. Values obtained from  different assay methods cannot be used interchangeably. PSA levels, regardless of value, should not be interpreted as absolute evidence of the presence or absence of disease.   04/06/2019 <0.1 < OR = 4.0 ng/mL Final    Comment:    The total PSA value from this assay system is  standardized against the WHO standard. The test  result will be approximately 20% lower when compared  to the equimolar-standardized total PSA (Beckman  Coulter). Comparison of serial PSA results should be  interpreted with this fact in mind. . This test was performed using the Siemens  chemiluminescent method. Values obtained from  different assay methods cannot be used interchangeably. PSA levels, regardless of value, should not be interpreted as absolute evidence of the presence or absence of disease.   10/26/2008 0.69 0.10 - 4.00 ng/mL Final    Comment:    See lab report for associated comment(s)   Testosterone  Date Value Ref Range Status  09/07/2021 313 264 - 916 ng/dL Final    Comment:    Adult male reference interval is based on a population of healthy nonobese males (BMI <30) between 68 and 27 years old. [place], [place] [phone]. PMID: 08144818.   02/09/2021 310 264 - 916 ng/dL Final    Comment:    Adult male reference interval is based on a population of healthy nonobese males (BMI <30) between 58 and 29 years old. [place], [place] [phone]. PMID: [id number].   07/28/2020 286 264 - 916 ng/dL Final    Comment:    Adult male reference interval is based on a population of healthy nonobese males (BMI <30) between 12 and 35 years old. [place], [place] [phone].  PMID: [id number].    UA is clear today.    Studies/Results:   Assessment & Plan: Prostate cancer.   His  PSA remains undetectible.  His T is normal.   OAB with incontinence with an elevated PVR.  He is doing better with the PTNS, trospium  and Gemtesa.  Recurrent UTI's.  UA is clear.   ED.  He will continue the Viagra.    No orders of the defined types were placed in this encounter.    Orders Placed This Encounter  Procedures   PSA    Standing Status:   Future    Standing Expiration Date:   04/14/2022   Testosterone    Standing Status:   Future    Standing Expiration Date:   04/14/2022   Urinalysis, Routine w reflex microscopic        Return in about 3 months (around 01/12/2022) for with labs.   CC: [name], PA-C      Irine Seal 10/12/2021 Patient ID: Jonathan [name], male   DOB: 05-27-1940, 81 y.o.   MRN: [id number] Patient ID: Jonathan [name], male   DOB: [birth year], 81 y.o.   MRN: [id number] Patient ID: Jonathan [name], male   DOB: [birth year], 81 y.o.   MRN: [id number]

## 2021-10-16 ENCOUNTER — Ambulatory Visit: Payer: TRICARE For Life (TFL)

## 2021-10-26 DIAGNOSIS — R030 Elevated blood-pressure reading, without diagnosis of hypertension: Secondary | ICD-10-CM | POA: Diagnosis not present

## 2021-10-26 DIAGNOSIS — M19072 Primary osteoarthritis, left ankle and foot: Secondary | ICD-10-CM | POA: Diagnosis not present

## 2021-10-26 DIAGNOSIS — M170 Bilateral primary osteoarthritis of knee: Secondary | ICD-10-CM | POA: Diagnosis not present

## 2021-10-26 DIAGNOSIS — M19071 Primary osteoarthritis, right ankle and foot: Secondary | ICD-10-CM | POA: Diagnosis not present

## 2021-10-26 DIAGNOSIS — R2689 Other abnormalities of gait and mobility: Secondary | ICD-10-CM | POA: Diagnosis not present

## 2021-10-26 DIAGNOSIS — M25571 Pain in right ankle and joints of right foot: Secondary | ICD-10-CM | POA: Diagnosis not present

## 2021-10-26 DIAGNOSIS — Z6827 Body mass index (BMI) 27.0-27.9, adult: Secondary | ICD-10-CM | POA: Diagnosis not present

## 2021-10-26 DIAGNOSIS — M25572 Pain in left ankle and joints of left foot: Secondary | ICD-10-CM | POA: Diagnosis not present

## 2021-10-30 DIAGNOSIS — Z961 Presence of intraocular lens: Secondary | ICD-10-CM | POA: Diagnosis not present

## 2021-10-30 DIAGNOSIS — H401133 Primary open-angle glaucoma, bilateral, severe stage: Secondary | ICD-10-CM | POA: Diagnosis not present

## 2021-11-08 ENCOUNTER — Ambulatory Visit (INDEPENDENT_AMBULATORY_CARE_PROVIDER_SITE_OTHER): Payer: Medicare Other | Admitting: Physician Assistant

## 2021-11-08 DIAGNOSIS — N3941 Urge incontinence: Secondary | ICD-10-CM

## 2021-11-08 NOTE — Progress Notes (Signed)
PTNS  Session # 30 of 45  Health & Social Factors: Diabetes, CHF, and taking fluid pills Caffeine: 2 cups Alcohol: none Daytime voids #per day: 10 Night-time voids #per night: 4 Urgency: yes lately Incontinence Episodes #per day: 4 Ankle used: right Treatment Setting: 13 Feeling/ Response: feeling in foot and leg Comments: n/a  Performed By: Levi Aland, CMA  Follow Up: Follow up as scheduled.

## 2021-11-09 ENCOUNTER — Ambulatory Visit: Payer: TRICARE For Life (TFL) | Admitting: Physician Assistant

## 2021-11-13 ENCOUNTER — Other Ambulatory Visit: Payer: Self-pay | Admitting: *Deleted

## 2021-11-13 NOTE — Patient Outreach (Signed)
  Care Coordination   11/13/2021 Name: Jonathan [name] MRN: [id number] DOB: 1940-05-18   Care Coordination Outreach Attempts:  An unsuccessful telephone outreach was attempted today to offer the patient information about available care coordination services as a benefit of their health plan.   Follow Up Plan:  Additional outreach attempts will be made to offer the patient care coordination information and services.   Encounter Outcome:  No Answer  Care Coordination Interventions Activated:  No   Care Coordination Interventions:  No, not indicated    Valente David, RN, MSN, [facility] Care Coordinator [phone]

## 2021-11-16 ENCOUNTER — Other Ambulatory Visit: Payer: Self-pay | Admitting: *Deleted

## 2021-11-16 ENCOUNTER — Encounter: Payer: Self-pay | Admitting: *Deleted

## 2021-11-16 NOTE — Patient Instructions (Signed)
Visit Information  Thank you for taking time to visit with me today. Please don't hesitate to contact me if I can be of assistance to you before our next scheduled telephone appointment.  Following are the goals we discussed today:  Reminded of need for AWV with PCP Have wife call with questions/concerns, and for assessment of needs.   Our next appointment is by telephone within the next week  Please call the Suicide and Crisis Lifeline: 988 call the [place] National Suicide Prevention Lifeline: 413-823-7305 or TTY: 215-370-4452 TTY 519-600-2781) to talk to a trained counselor call 1-800-273-TALK (toll free, 24 hour hotline) call the [facility]: [phone] call 911 if you are experiencing a Mental Health or [place] or need someone to talk to.  Patient verbalizes understanding of instructions and care plan provided today and agrees to view in [place]. Active MyChart status and patient understanding of how to access instructions and care plan via MyChart confirmed with patient.     We have been unable to make contact with the patient for follow up. The care management team is available to follow up with the patient after provider conversation with the patient regarding recommendation for care management engagement and subsequent re-referral to the care management team.   Valente David, RN, MSN, [facility] Care Management Coordinator [phone]

## 2021-11-16 NOTE — Patient Outreach (Signed)
  Care Coordination   Initial Visit Note   11/16/2021 Name: Jonathan [name] MRN: [id number] DOB: 01-13-1941  Jonathan [name] is a 81 y.o. year old male who sees [name], [place] for primary care. I spoke with  Jonathan [name] by phone today.    What matters to the patients health and wellness today?  Report he has a lot of chronic problems but state he is doing well today.  State he has short term memory issues, call placed to wife to confirm assessment information is correct, no answer, will call back next week.    Goals Addressed               This Visit's Progress     Health Maintenance (pt-stated)        Care Coordination Interventions: Evaluation of current treatment plan related to maintaining health and patient's adherence to plan as established by provider Advised patient to have wife call this RNCM Reviewed medications with patient and discussed importance of adherence Reviewed scheduled/upcoming provider appointments including need for AWV Assessed social determinant of health barriers        SDOH assessments and interventions completed:  Yes  SDOH Interventions Today    Flowsheet Row Most Recent Value  SDOH Interventions   Food Insecurity Interventions Intervention Not Indicated  Financial Strain Interventions Intervention Not Indicated  Housing Interventions Intervention Not Indicated  Transportation Interventions Intervention Not Indicated        Care Coordination Interventions Activated:  Yes  Care Coordination Interventions:  Yes, provided   Follow up plan: Follow up call scheduled for within the next week with wife    Encounter Outcome:  Pt. Visit Completed   Valente David, RN, MSN, [place] Care Management Coordinator [phone]

## 2021-11-17 ENCOUNTER — Other Ambulatory Visit: Payer: Self-pay | Admitting: *Deleted

## 2021-11-17 NOTE — Patient Outreach (Signed)
  Care Coordination   Follow Up Visit Note   11/17/2021 Name: Jonathan [name] MRN: [id number] DOB: December 17, 1940  Jonathan [name] is a 81 y.o. year old male who sees [name], [place] for primary care. I spoke with  Jonathan [name], wife of Jonathan [name] by phone today.  What matters to the patients health and wellness today?  Report member has had decrease in energy, shortness of breath, and increase in intermittent pain.  She is unsure of when his last appointment was with pulmonology, will call to schedule follow up.  Daughter is home with member daily to support in management of care.     Goals Addressed               This Visit's Progress     Health Maintenance (pt-stated)   On track     Care Coordination Interventions: Evaluation of current treatment plan related to maintaining health and patient's adherence to plan as established by provider Advised patient to have wife call this RNCM Reviewed medications with patient and discussed importance of adherence Reviewed scheduled/upcoming provider appointments including need for AWV Assessed social determinant of health barriers  8/25 - Wife encouraged to schedule appointment with pulmonologist for complaints of shortness of breath, history of COPD and lung cancer requiring surgery to remove.         SDOH assessments and interventions completed:  No     Care Coordination Interventions Activated:  Yes  Care Coordination Interventions:  Yes, provided   Follow up plan: Follow up call scheduled for 9/22    Encounter Outcome:  Pt. Visit Completed   Valente David, RN, MSN, [place] [facility] Management Care Management Coordinator [phone]

## 2021-11-20 ENCOUNTER — Other Ambulatory Visit: Payer: Self-pay | Admitting: *Deleted

## 2021-11-20 NOTE — Patient Outreach (Signed)
  Care Coordination   Follow Up Visit Note   11/20/2021 Name: Jonathan [name] MRN: [id number] DOB: November 21, 1940  Jonathan [name] is a 81 y.o. year old male who sees [name], [place] for primary care. I spoke with  [name] by phone today.  What matters to the patients health and wellness today?  Patient called inquiring about discuss with wife on Friday regarding his shortness of breath and follow up with pulmonology.  State he has had a CT scan, no new cancer found.  He and will will follow up with pulmonology to advise of ongoing breathing issues and inquire about further testing.     Goals Addressed               This Visit's Progress     Health Maintenance (pt-stated)   On track     Care Coordination Interventions: Evaluation of current treatment plan related to maintaining health and patient's adherence to plan as established by provider Advised patient to have wife call this RNCM Reviewed medications with patient and discussed importance of adherence Reviewed scheduled/upcoming provider appointments including need for AWV Assessed social determinant of health barriers  8/25 - Wife encouraged to schedule appointment with pulmonologist for complaints of shortness of breath, history of COPD and lung cancer requiring surgery to remove.         SDOH assessments and interventions completed:  No     Care Coordination Interventions Activated:  Yes  Care Coordination Interventions:  Yes, provided   Follow up plan: Follow up call scheduled for 9/22    Encounter Outcome:  Pt. Visit Completed   Valente David, RN, MSN, [place] Care Management Care Management Coordinator [phone]

## 2021-11-30 DIAGNOSIS — M19071 Primary osteoarthritis, right ankle and foot: Secondary | ICD-10-CM | POA: Diagnosis not present

## 2021-11-30 DIAGNOSIS — R2689 Other abnormalities of gait and mobility: Secondary | ICD-10-CM | POA: Diagnosis not present

## 2021-11-30 DIAGNOSIS — R4189 Other symptoms and signs involving cognitive functions and awareness: Secondary | ICD-10-CM | POA: Diagnosis not present

## 2021-11-30 DIAGNOSIS — M6281 Muscle weakness (generalized): Secondary | ICD-10-CM | POA: Diagnosis not present

## 2021-11-30 DIAGNOSIS — I10 Essential (primary) hypertension: Secondary | ICD-10-CM | POA: Diagnosis not present

## 2021-11-30 DIAGNOSIS — J701 Chronic and other pulmonary manifestations due to radiation: Secondary | ICD-10-CM | POA: Diagnosis not present

## 2021-11-30 DIAGNOSIS — R531 Weakness: Secondary | ICD-10-CM | POA: Diagnosis not present

## 2021-11-30 DIAGNOSIS — E0865 Diabetes mellitus due to underlying condition with hyperglycemia: Secondary | ICD-10-CM | POA: Diagnosis not present

## 2021-11-30 DIAGNOSIS — R42 Dizziness and giddiness: Secondary | ICD-10-CM | POA: Diagnosis not present

## 2021-11-30 DIAGNOSIS — K5901 Slow transit constipation: Secondary | ICD-10-CM | POA: Diagnosis not present

## 2021-11-30 DIAGNOSIS — M792 Neuralgia and neuritis, unspecified: Secondary | ICD-10-CM | POA: Diagnosis not present

## 2021-11-30 DIAGNOSIS — Z85118 Personal history of other malignant neoplasm of bronchus and lung: Secondary | ICD-10-CM | POA: Diagnosis not present

## 2021-12-01 ENCOUNTER — Telehealth: Payer: Self-pay | Admitting: Orthopedic Surgery

## 2021-12-01 NOTE — Telephone Encounter (Signed)
Received call from patient. He needs another copy of his last of note. I faxed to him per his request at [phone]

## 2021-12-05 ENCOUNTER — Ambulatory Visit (INDEPENDENT_AMBULATORY_CARE_PROVIDER_SITE_OTHER): Payer: Medicare Other | Admitting: Physician Assistant

## 2021-12-05 VITALS — BP 117/82 | HR 100

## 2021-12-05 DIAGNOSIS — R300 Dysuria: Secondary | ICD-10-CM | POA: Diagnosis not present

## 2021-12-05 DIAGNOSIS — R339 Retention of urine, unspecified: Secondary | ICD-10-CM

## 2021-12-05 DIAGNOSIS — N3941 Urge incontinence: Secondary | ICD-10-CM

## 2021-12-05 LAB — URINALYSIS, ROUTINE W REFLEX MICROSCOPIC
Bilirubin, UA: NEGATIVE
Glucose, UA: NEGATIVE
Leukocytes,UA: NEGATIVE
Nitrite, UA: NEGATIVE
Protein,UA: NEGATIVE
RBC, UA: NEGATIVE
Specific Gravity, UA: 1.02 (ref 1.005–1.030)
Urobilinogen, Ur: 1 mg/dL (ref 0.2–1.0)
pH, UA: 5.5 (ref 5.0–7.5)

## 2021-12-05 LAB — BLADDER SCAN AMB NON-IMAGING: Scan Result: <418

## 2021-12-05 MED ORDER — TAMSULOSIN HCL 0.4 MG PO CAPS: 0.4000 mg | ORAL_CAPSULE | Freq: Every day | ORAL | 1 refills | Status: DC

## 2021-12-05 NOTE — Progress Notes (Signed)
Pt here today for bladder scan. Bladder was scanned and <418 was visualized.   Performed by Marisue Brooklyn, CMA  Additional follow up follow up as scheduled   Simple Catheter Placement  Due to urinary retention patient is present today for a foley cath placement.  Patient was cleaned and prepped in a sterile fashion with betadine. A 16 FR foley catheter was inserted, urine return was noted  351ml, urine was yellow in color.  The balloon was filled with 10cc of sterile water.  A leg bag was attached for drainage. Patient was also given a night bag to take home and was given instruction on how to change from one bag to another.  Patient was given instruction on proper catheter care.  Patient tolerated well, no complications were noted   Performed by: Marisue Brooklyn, CMA  Additional notes/ Follow up: Follow up as schedule

## 2021-12-05 NOTE — Progress Notes (Signed)
Assessment: 1. Dysuria - Urinalysis, Routine w reflex microscopic - BLADDER SCAN AMB NON-IMAGING  2. Urinary retention  3. Urge incontinence    Plan: Foley placed and pt will DC Gemtesa. Flomax sent to his pharmacy.  Potential side effects discussed.  FU with Dr. Jeffie Pollock in 7-10 days for possible voiding trial/discuss other catheter options going forward.   Chief Complaint: No chief complaint on file.   HPI: Jonathan [name] is a 81 y.o. male with h/o prostate CA, incontinence, recurrent UTIs and retention in the past who presents for evaluation of dysuria and increase in urinary frequency and incontinence over the past several months. Nocturia at least 5-6 times/night. Wearing multiple pull up briefs during the day. No fever, chills, gross hematuria. Pt on Gemtesa and trospium for UUI, but states he has only been taking the Flowella. Denies new meds since last visit.  UA= clear PVR=456ml IPSS= 30          QOL=5   10/12/21: Jonathan [name] is here today for PTNS.  His PSA remains <0.1.  He completed EXRT with ADT in 9/19 with the last lupron in 1/21.  His testosterone is up to 313.  His UA is clear.  He has incontinence and wears guards.  His IPSS is 25.  He has nocturia x 3.  He is using the [place] and trospium.   He continues to use Viagra for the ED.   He had a PET scan on 7/12 for f/u of his lung cancer.   There was no evidence of recurrent disease.     06/22/21: Jonathan [name] returns today in f/u. He has a history of T3b N0 M0 Gleason 9 prostate cancer that was treated with EXRT and ADT.   He completed EXRT in 9/19.   His final  Lupron injection was given 1/21. His PSA was up to 0.6 in November from an undetectible level.  He has no bone pain or weight loss.  His testosterone level was 310 which was up from 286 in May.   He had a lumbar laminectomy in January and he is recovering from that with improvement in his symptoms.   He continues to have OAB wet and is using depends about 4 daily.   He has  been on the PTNS maintenance therapy.  He is out of Gemtesa and his symptoms have worsened with increased nocturia.   He has ED and is using Viagra.  He is not using the injections.  He has had no hematuria or dysuria.   His IPSS is 24.    02/09/21: Jonathan [name] returns today in f/u.  His PSA was <0.1 on 07/28/20.  He didn't get labs prior to the visit.  He has OAB wet and is has been on PTNS maintenance and Gemtesa samples..  He has frequent small voids.  He has nocturia x 3. UA is clear today.   He isn't having accidents. He has some dysuria but no hematuria.   He is getting PTNS today.   He has tadalafil and viagra that he uses prn.  He was given prostin and started using it at home and has had success, but he has been given the med by the [place] and he says the needles are too big.  He does have success with the viagra.       Portions of the above documentation were copied from a prior visit for review purposes only.  Allergies: Allergies  Allergen Reactions   Ampicillin Other (See Comments)  Don't work   Ciprofloxacin Other (See Comments)    Doesn't work   Levaquin [Levofloxacin] Other (See Comments)    Doesn't work    PMH: Past Medical History:  Diagnosis Date   Acquired bladder diverticulum    12/ 2012  resection diverticulum done at [facility] in [place], [place]   Age-related cataract of right eye    scheduled for catarat extraction 09-12-2017   Agent orange exposure    Aneurysm of infrarenal abdominal aorta ([place])    first dx 2017--- last CT 06-11-2017  measures 3.5cm   Anxiety    Chronic constipation    COPD with emphysema ([place])    06--12-2017  per pt no symptoms since Feb 2019   Depression    PTSD   Diabetes mellitus type 2, diet-controlled ([place])    Diabetic neuropathy ([place])    Dyslipidemia    Dyspnea on exertion    Erectile dysfunction    Feeling of incomplete bladder emptying    Gait abnormality 01/06/2020   GERD (gastroesophageal reflux disease)    Gout    09-02-2017 last flare up  3 months ago   Hiatal hernia    History of atrial fibrillation    episode post op right lung lobectomy 07-04-2015   History of bladder stone    History of colon polyps    History of DVT of lower extremity    03/ 2019  bilateral lower extremity (superficial) ---  per treated w/ oral medication    History of gastritis    History of kidney stones    History of radiation therapy 09-14-2015  to 09-21-2015   left upper lung nodule -- 54Gy in 3 fractions (18 Gy per fraction)   Hyperplasia of prostate with lower urinary tract symptoms (LUTS)    Hypertension    Lumbar spinal stenosis    Nephrolithiasis    Neurogenic bladder    Osteoarthritis    ankle, hands   Osteoporosis    Prostate cancer [facility]) urologist-  dr wrenn/  oncologist-  dr Tammi Klippel   dx 05-24-2017-- Stage T2b,  Gleason 4+4,  PSA 22.41,  vol 38cc--- started ADT 04/ 2019,  plan external radiation therapy   Radiation fibrosis of lung ([place])    hx left upper long nodule SBRT 06/ 2017   Squamous cell carcinoma of both lungs ([place]) last CT in epic dated 06-26-2017 no recurrence   dx 03/ 2017  non-small cell SCC via bronchoscopy w/ bx's by dr Roxan Hockey---  s/p  right VATS w/ right lobectomy and node dissection's 07-04-2015 /  pt had SBRT to left upper nodule Stage 1 ([facility]) completed 09-21-2015   Vertigo 2018   Wears dentures    bottom   Wears glasses     PSH: Past Surgical History:  Procedure Laterality Date   BIOPSY  05/29/2019   Procedure: BIOPSY;  Surgeon: Rogene Houston, MD;  [facility];  Service: Endoscopy;;  gastric ulcer   CARPAL TUNNEL RELEASE Right 07/09/2014   Procedure: RIGHT OPEN CARPAL TUNNEL RELEASE;  Surgeon: Mcarthur Rossetti, MD;  [facility];  Service: Orthopedics;  Laterality: Right;   CHOLECYSTECTOMY N/A 02/24/2014   Procedure: LAPAROSCOPIC CHOLECYSTECTOMY;  Surgeon: Coralie Keens, MD;  Location: [place];  Service: General;  Laterality: N/A;   COLONOSCOPY      CYSTOLITHOTOMY  11/ 2007      [facility] [place] [facility]2018   Procedure: CYSTOSCOPY FLEXIBLE;  Surgeon: Irine Seal, MD;  Location[facility];  Service: Urology;  Laterality: N/A;   CYSTOSCOPY W/ LITHOLAPAXY / EHL  02-24-2007   dr Janice Norrie  [facility] [place]   dental implant     No teeth at this time waiting for them to be made as of 01-30-19   ESOPHAGOGASTRODUODENOSCOPY (EGD) WITH PROPOFOL N/A 05/29/2019   Procedure: ESOPHAGOGASTRODUODENOSCOPY (EGD) WITH PROPOFOL;  Surgeon: Rogene Houston, MD;  [facility];  Service: Endoscopy;  Laterality: N/A;  [place] IMPLANT N/A 09/05/2017   Procedure: GOLD SEED IMPLANT;  Surgeon: Irine Seal, MD;  Location: [facility];  Service: Urology;  Laterality: N/A;   INSERTION OF SUPRAPUBIC CATHETER N/A 04/18/2018   Procedure: SUPRAPUBIC TUBE CHANGE;  Surgeon: Irine Seal, MD;  [facility];  Service: Urology;  Laterality: N/A;   IR CATHETER TUBE CHANGE  02/24/2018   LUMBAR LAMINECTOMY/DECOMPRESSION MICRODISCECTOMY Right 03/31/2021   Procedure: Bilateral Lumbar Three- Four, Lumbar Four-Five Laminectomy, Decompression;  Surgeon: Ashok Pall, MD;  Location: [place];  Service: Neurosurgery;  Laterality: Right;   SPACE OAR INSTILLATION N/A 09/05/2017   Procedure: SPACE OAR INSTILLATION;  Surgeon: Irine Seal, MD;  [facility];  Service: Urology;  Laterality: N/A;   TOTAL ANKLE ARTHROPLASTY Right 04/20/2015   Procedure: TOTAL ANKLE ARTHOPLASTY;  Surgeon: Newt Minion, MD;  Location: [place];  Service: Orthopedics;  Laterality: Right;   TRANSTHORACIC ECHOCARDIOGRAM  11/16/2012   ef 55-60%,  grade 1 diastolic dysfunction/  mild dilated ascending aorta/  mild MR/ trivial TR   TRANSURETHRAL RESECTION OF PROSTATE N/A 02/03/2019   Procedure: TRANSURETHRAL RESECTION OF THE PROSTATE (TURP);  Surgeon: Irine Seal, MD;  [facility];  Service: Urology;  Laterality: N/A;   VIDEO ASSISTED THORACOSCOPY (VATS)/ LOBECTOMY Right  07/04/2015   Procedure: VIDEO ASSISTED THORACOSCOPY (VATS)/ RIGHT UPPER LOBECTOMY;  Surgeon: Melrose Nakayama, MD;  Location: [place];  Service: Thoracic;  Laterality: Right;   VIDEO BRONCHOSCOPY N/A 06/17/2015   Procedure: VIDEO BRONCHOSCOPY;  Surgeon: Melrose Nakayama, MD;  Location: [place];  Service: Thoracic;  Laterality: N/A;   VIDEO BRONCHOSCOPY WITH ENDOBRONCHIAL NAVIGATION N/A 06/17/2015   Procedure: VIDEO BRONCHOSCOPY WITH ENDOBRONCHIAL NAVIGATION;  Surgeon: Melrose Nakayama, MD;  Location: [place];  Service: Thoracic;  Laterality: N/A;   VIDEO BRONCHOSCOPY WITH ENDOBRONCHIAL ULTRASOUND N/A 06/17/2015   Procedure: VIDEO BRONCHOSCOPY WITH ENDOBRONCHIAL ULTRASOUND;  Surgeon: Melrose Nakayama, MD;  Location: [place];  Service: Thoracic;  Laterality: N/A;    SH: Social History   Tobacco Use   Smoking status: Former    Packs/day: 0.50    Years: 40.00    Total pack years: 20.00    Types: Cigarettes    Quit date: 02/13/2015    Years since quitting: 6.8   Smokeless tobacco: Never  Vaping Use   Vaping Use: Never used  Substance Use Topics   Alcohol use: No    Alcohol/week: 0.0 standard drinks of alcohol   Drug use: No    ROS: All other review of systems were reviewed and are negative except what is noted above in HPI  PE: BP 117/82   Pulse 100  GENERAL APPEARANCE:  Well appearing, well developed, well nourished, NAD HEENT:  Atraumatic, normocephalic NECK:  Supple. Trachea midline ABDOMEN:  Soft, non-tender, no masses EXTREMITIES:  Moves all extremities well NEUROLOGIC:  Alert and oriented x 3 MENTAL STATUS:  appropriate BACK:  Non-tender to palpation, No CVAT SKIN:  Warm, dry, and intact   Results:  Laboratory Data: Lab Results  Component Value Date   WBC 7.3 04/06/2021   HGB 12.3 (L) 04/06/2021   HCT 37.7 (L) 04/06/2021   MCV 95.9 04/06/2021   PLT 253 04/06/2021    Lab Results  Component Value Date   CREATININE 1.09 04/06/2021    Lab Results   Component Value Date   PSA <0.1 08/21/2019   PSA <0.1 04/06/2019   PSA 0.69 10/26/2008    Lab Results  Component Value Date   TESTOSTERONE 313 09/07/2021    Lab Results  Component Value Date   HGBA1C 5.8 (H) 03/28/2021    Urinalysis    Component Value Date/Time   COLORURINE YELLOW 10/04/2019 1506   APPEARANCEUR Clear 10/12/2021 1533   LABSPEC 1.017 10/04/2019 1506   PHURINE 5.0 10/04/2019 1506   GLUCOSEU Negative 10/12/2021 1533   HGBUR NEGATIVE 10/04/2019 1506   BILIRUBINUR Negative 10/12/2021 1533   KETONESUR NEGATIVE 10/04/2019 1506   PROTEINUR Trace (A) 10/12/2021 1533   PROTEINUR 30 (A) 10/04/2019 1506   UROBILINOGEN negative (A) 09/11/2019 1107   UROBILINOGEN 1.0 10/04/2014 2342   NITRITE Negative 10/12/2021 1533   NITRITE NEGATIVE 10/04/2019 1506   LEUKOCYTESUR Negative 10/12/2021 1533   LEUKOCYTESUR MODERATE (A) 10/04/2019 1506    Lab Results  Component Value Date   LABMICR Comment 10/12/2021   WBCUA None seen 06/22/2021   LABEPIT None seen 06/22/2021   MUCUS Present 06/22/2021   BACTERIA None seen 06/22/2021    Pertinent Imaging: No results found for this or any previous visit.  Results for orders placed during the hospital encounter of 04/06/21  US Venous Img Lower Bilateral  Narrative CLINICAL DATA:  Postoperative swelling.  EXAM: BILATERAL LOWER EXTREMITY VENOUS DOPPLER ULTRASOUND  TECHNIQUE: Gray-scale sonography with graded compression, as well as color Doppler and duplex ultrasound were performed to evaluate the lower extremity deep venous systems from the level of the common femoral vein and including the common femoral, femoral, profunda femoral, popliteal and calf veins including the posterior tibial, peroneal and gastrocnemius veins when visible. The superficial great saphenous vein was also interrogated. Spectral Doppler was utilized to evaluate flow at rest and with distal augmentation maneuvers in the common femoral, femoral  and popliteal veins.  COMPARISON:  None.  FINDINGS: RIGHT LOWER EXTREMITY  Common Femoral Vein: No evidence of thrombus. Normal compressibility, respiratory phasicity and response to augmentation.  Saphenofemoral Junction: No evidence of thrombus. Normal compressibility and flow on color Doppler imaging.  Profunda Femoral Vein: No evidence of thrombus. Normal compressibility and flow on color Doppler imaging.  Femoral Vein: No evidence of thrombus. Normal compressibility, respiratory phasicity and response to augmentation.  Popliteal Vein: No evidence of thrombus. Normal compressibility, respiratory phasicity and response to augmentation.  Calf Veins: No evidence of thrombus. Normal compressibility and flow on color Doppler imaging.  Superficial Great Saphenous Vein: No evidence of thrombus. Normal compressibility.  Venous Reflux:  None.  Other Findings:  None.  LEFT LOWER EXTREMITY  Common Femoral Vein: No evidence of thrombus. Normal compressibility, respiratory phasicity and response to augmentation.  Saphenofemoral Junction: No evidence of thrombus. Normal compressibility and flow on color Doppler imaging.  Profunda Femoral Vein: No evidence of thrombus. Normal compressibility and flow on color Doppler imaging.  Femoral Vein: No evidence of thrombus. Normal compressibility, respiratory phasicity and response to augmentation.  Popliteal Vein: No evidence of thrombus. Normal compressibility, respiratory phasicity and response to augmentation.  Calf Veins: No evidence of thrombus. Normal compressibility and flow on color Doppler imaging.  There is limited view of the peroneal vein.  Superficial Great Saphenous Vein: No evidence of thrombus. Normal compressibility.  Venous Reflux:  None.  Other Findings:  None.  IMPRESSION: No evidence of deep venous thrombosis in either lower extremity.   Electronically Signed By: Ronney Asters M.D. On: 04/06/2021  17:20  No results found for this or any previous visit.  No results found for this or any previous visit.  Results for orders placed during the hospital encounter of 08/06/14  US Renal  Narrative CLINICAL DATA:  Pain.  Renal cyst.  Prior bladder surgery.  EXAM: RENAL / URINARY TRACT ULTRASOUND COMPLETE  COMPARISON:  CT 02/03/2014.  FINDINGS: Right Kidney:  Length: 12.1 cm. Echogenicity within normal limits. No significant mass or hydronephrosis visualized. Multiple cysts are noted. Largest measures 2.2 cm. This cyst contains a thin septation and S is most likely benign.  Left Kidney:  Length: 12.9 cm. Echogenicity within normal limits. No significant mass or hydronephrosis visualized. Multiple simple cysts. The largest measures 3.1 cm.  Bladder:  Appears normal for degree of bladder distention. Mild prostate prominence.  IMPRESSION: 1.  Bilateral renal benign-appearing cysts.  2.  Mild prostate prominence.  Bladder nondistended.   Electronically Signed By: [place] On: 08/06/2014 15:01  No results found for this or any previous visit.  No results found for this or any previous visit.  Results for orders placed during the hospital encounter of 10/04/19  CT Renal Stone Study  Narrative CLINICAL DATA:  Left flank pain, frequent urination, prostate cancer  EXAM: CT ABDOMEN AND PELVIS WITHOUT CONTRAST  TECHNIQUE: Multidetector CT imaging of the abdomen and pelvis was performed following the standard protocol without IV contrast.  COMPARISON:  02/21/2019  FINDINGS: Lower chest: No acute pleural or parenchymal lung disease.  Hepatobiliary: No focal liver abnormality is seen. Status post cholecystectomy. No biliary dilatation.  Pancreas: Unremarkable. No pancreatic ductal dilatation or surrounding inflammatory changes.  Spleen: Normal in size without focal abnormality.  Adrenals/Urinary Tract: There are stable bilateral renal  cortical hypodensities compatible with cysts. There is a stable 3 mm nonobstructing calculus lower pole left kidney reference image 37. No other urinary tract calculi or obstructive uropathy.  Bladder is moderately distended without focal abnormality. The adrenals are unremarkable.  Stomach/Bowel: No bowel obstruction or ileus. Normal appendix right lower quadrant. No bowel wall thickening or inflammatory change.  Vascular/Lymphatic: Aortic atherosclerosis. No enlarged abdominal or pelvic lymph nodes. Stable mild dilation of the infrarenal abdominal aorta measuring 3.5 cm.  Reproductive: Prostate is not enlarged. Fiduciary markers are identified. Postsurgical changes from TURP.  Other: No abdominal wall hernia or abnormality. No abdominopelvic ascites.  Musculoskeletal: No acute or destructive bony lesions. Reconstructed images demonstrate no additional findings.  IMPRESSION: 1. Stable 3 mm nonobstructing left renal calculus. 2. Stable mild dilation of the infrarenal abdominal aorta measuring 3.5 cm. Recommend followup by ultrasound in 2 years. This recommendation follows ACR consensus guidelines: White Paper of the ACR Incidental Findings Committee II on Vascular Findings. J Am Coll Radiol 2013; 10:789-794. Aortic aneurysm NOS (ICD10-I71.9) 3. Aortic Atherosclerosis (ICD10-I70.0).   Electronically Signed By: Randa Ngo M.D. On: 10/04/2019 15:24  Results for orders placed or performed in visit on 12/05/21 (from the past 24 hour(s))  BLADDER SCAN AMB NON-IMAGING   Collection Time: 12/05/21  9:12 AM  Result Value Ref Range   Scan Result <418

## 2021-12-05 NOTE — Patient Instructions (Signed)
Stop Gemtesa and trospium  Start Flomax

## 2021-12-13 ENCOUNTER — Ambulatory Visit: Payer: Medicare Other | Admitting: Physician Assistant

## 2021-12-14 ENCOUNTER — Ambulatory Visit: Payer: TRICARE For Life (TFL) | Admitting: Urology

## 2021-12-15 ENCOUNTER — Ambulatory Visit: Payer: Self-pay | Admitting: *Deleted

## 2021-12-15 NOTE — Patient Outreach (Signed)
  Care Coordination   12/15/2021 Name: Jonathan [name] MRN: [id number] DOB: 1940/11/24   Care Coordination Outreach Attempts:  An unsuccessful telephone outreach was attempted for a scheduled appointment today.  Follow Up Plan:  Additional outreach attempts will be made to offer the patient care coordination information and services.   Encounter Outcome:  No Answer  Care Coordination Interventions Activated:  No   Care Coordination Interventions:  No, not indicated    Valente David, RN, MSN, [facility] Care Management Care Management Coordinator [phone]

## 2021-12-18 ENCOUNTER — Telehealth: Payer: Self-pay

## 2021-12-18 NOTE — Telephone Encounter (Signed)
The last apt should have been canceled, he was not seen that day by nurse or provider.

## 2021-12-19 ENCOUNTER — Ambulatory Visit (INDEPENDENT_AMBULATORY_CARE_PROVIDER_SITE_OTHER): Payer: Medicare Other | Admitting: Physician Assistant

## 2021-12-19 VITALS — BP 151/75 | HR 89

## 2021-12-19 DIAGNOSIS — Z8546 Personal history of malignant neoplasm of prostate: Secondary | ICD-10-CM | POA: Diagnosis not present

## 2021-12-19 DIAGNOSIS — Z87440 Personal history of urinary (tract) infections: Secondary | ICD-10-CM

## 2021-12-19 DIAGNOSIS — N3941 Urge incontinence: Secondary | ICD-10-CM | POA: Diagnosis not present

## 2021-12-19 DIAGNOSIS — N390 Urinary tract infection, site not specified: Secondary | ICD-10-CM

## 2021-12-19 DIAGNOSIS — R339 Retention of urine, unspecified: Secondary | ICD-10-CM

## 2021-12-19 MED ORDER — TAMSULOSIN HCL 0.4 MG PO CAPS: 0.4000 mg | ORAL_CAPSULE | Freq: Every day | ORAL | 1 refills | Status: DC

## 2021-12-19 NOTE — Progress Notes (Deleted)
Assessment: There are no diagnoses linked to this encounter.   Plan: ***  Chief Complaint: No chief complaint on file.   HPI: Jonathan [name] is a 81 y.o. male who presents for continued evaluation of ***.  UA= PVR= IPSS=           QOL=  Portions of the above documentation were copied from a prior visit for review purposes only.  Allergies: Allergies  Allergen Reactions   Ampicillin Other (See Comments)    Don't work   Ciprofloxacin Other (See Comments)    Doesn't work   Levaquin [Levofloxacin] Other (See Comments)    Doesn't work    PMH: Past Medical History:  Diagnosis Date   Acquired bladder diverticulum    12/ 2012  resection diverticulum done at [facility] in [place], [place]   Age-related cataract of right eye    scheduled for catarat extraction 09-12-2017   Agent orange exposure    Aneurysm of infrarenal abdominal aorta ([place])    first dx 2017--- last CT 06-11-2017  measures 3.5cm   Anxiety    Chronic constipation    COPD with emphysema ([place])    06--12-2017  per pt no symptoms since Feb 2019   Depression    PTSD   Diabetes mellitus type 2, diet-controlled ([place])    Diabetic neuropathy ([place])    Dyslipidemia    Dyspnea on exertion    Erectile dysfunction    Feeling of incomplete bladder emptying    Gait abnormality 01/06/2020   GERD (gastroesophageal reflux disease)    Gout    09-02-2017 last flare up 3 months ago   Hiatal hernia    History of atrial fibrillation    episode post op right lung lobectomy 07-04-2015   History of bladder stone    History of colon polyps    History of DVT of lower extremity    03/ 2019  bilateral lower extremity (superficial) ---  per treated w/ oral medication    History of gastritis    History of kidney stones    History of radiation therapy 09-14-2015  to 09-21-2015   left upper lung nodule -- 54Gy in 3 fractions (18 Gy per fraction)   Hyperplasia of prostate with lower urinary tract symptoms (LUTS)    Hypertension     Lumbar spinal stenosis    Nephrolithiasis    Neurogenic bladder    Osteoarthritis    ankle, hands   Osteoporosis    Prostate cancer [facility]) urologist-  dr wrenn/  oncologist-  dr Tammi Klippel   dx 05-24-2017-- Stage T2b,  Gleason 4+4,  PSA 22.41,  vol 38cc--- started ADT 04/ 2019,  plan external radiation therapy   Radiation fibrosis of lung ([place])    hx left upper long nodule SBRT 06/ 2017   Squamous cell carcinoma of both lungs ([place]) last CT in epic dated 06-26-2017 no recurrence   dx 03/ 2017  non-small cell SCC via bronchoscopy w/ bx's by dr Roxan Hockey---  s/p  right VATS w/ right lobectomy and node dissection's 07-04-2015 /  pt had SBRT to left upper nodule Stage 1 ([facility]) completed 09-21-2015   Vertigo 2018   Wears dentures    bottom   Wears glasses     PSH: Past Surgical History:  Procedure Laterality Date   BIOPSY  05/29/2019   Procedure: BIOPSY;  Surgeon: Rogene Houston, MD;  [facility];  Service: Endoscopy;;  gastric ulcer   CARPAL TUNNEL RELEASE Right 07/09/2014  Procedure: RIGHT OPEN CARPAL TUNNEL RELEASE;  Surgeon: Mcarthur Rossetti, MD;  [facility];  Service: Orthopedics;  Laterality: Right;   CHOLECYSTECTOMY N/A 02/24/2014   Procedure: LAPAROSCOPIC CHOLECYSTECTOMY;  Surgeon: Coralie Keens, MD;  Location: [place];  Service: General;  Laterality: N/A;   COLONOSCOPY     CYSTOLITHOTOMY  11/ 2007      [facility] [place] 04/18/2018   Procedure: CYSTOSCOPY FLEXIBLE;  Surgeon: Irine Seal, MD;  Location: [facility];  Service: Urology;  Laterality: N/A;   CYSTOSCOPY W/ LITHOLAPAXY / EHL  02-24-2007   dr Janice Norrie  [facility]   dental implant     No teeth at this time waiting for them to be made as of 01-30-19   ESOPHAGOGASTRODUODENOSCOPY (EGD) WITH PROPOFOL N/A 05/29/2019   Procedure: ESOPHAGOGASTRODUODENOSCOPY (EGD) WITH PROPOFOL;  Surgeon: Rogene Houston, MD;  [facility];  Service: Endoscopy;  Laterality: N/A;   [place] IMPLANT N/A 09/05/2017   Procedure: GOLD SEED IMPLANT;  Surgeon: Irine Seal, MD;  Location: [facility];  Service: Urology;  Laterality: N/A;   INSERTION OF SUPRAPUBIC CATHETER N/A 04/18/2018   Procedure: SUPRAPUBIC TUBE CHANGE;  Surgeon: Irine Seal, MD;  [facility];  Service: Urology;  Laterality: N/A;   IR CATHETER TUBE CHANGE  02/24/2018   LUMBAR LAMINECTOMY/DECOMPRESSION MICRODISCECTOMY Right 03/31/2021   Procedure: Bilateral Lumbar Three- Four, Lumbar Four-Five Laminectomy, Decompression;  Surgeon: Ashok Pall, MD;  Location: [place];  Service: Neurosurgery;  Laterality: Right;   SPACE OAR INSTILLATION N/A 09/05/2017   Procedure: SPACE OAR INSTILLATION;  Surgeon: Irine Seal, MD;  Location: [facility];  Service: Urology;  Laterality: N/A;   TOTAL ANKLE ARTHROPLASTY Right 04/20/2015   Procedure: TOTAL ANKLE ARTHOPLASTY;  Surgeon: Newt Minion, MD;  Location: [place];  Service: Orthopedics;  Laterality: Right;   TRANSTHORACIC ECHOCARDIOGRAM  11/16/2012   ef 55-60%,  grade 1 diastolic dysfunction/  mild dilated ascending aorta/  mild MR/ trivial TR   TRANSURETHRAL RESECTION OF PROSTATE N/A 02/03/2019   Procedure: TRANSURETHRAL RESECTION OF THE PROSTATE (TURP);  Surgeon: Irine Seal, MD;  [facility];  Service: Urology;  Laterality: N/A;   VIDEO ASSISTED THORACOSCOPY (VATS)/ LOBECTOMY Right 07/04/2015   Procedure: VIDEO ASSISTED THORACOSCOPY (VATS)/ RIGHT UPPER LOBECTOMY;  Surgeon: Melrose Nakayama, MD;  Location: [place];  Service: Thoracic;  Laterality: Right;   VIDEO BRONCHOSCOPY N/A 06/17/2015   Procedure: VIDEO BRONCHOSCOPY;  Surgeon: Melrose Nakayama, MD;  Location: [place];  Service: Thoracic;  Laterality: N/A;   VIDEO BRONCHOSCOPY WITH ENDOBRONCHIAL NAVIGATION N/A 06/17/2015   Procedure: VIDEO BRONCHOSCOPY WITH ENDOBRONCHIAL NAVIGATION;  Surgeon: Melrose Nakayama, MD;  Location: [place];  Service: Thoracic;  Laterality: N/A;    VIDEO BRONCHOSCOPY WITH ENDOBRONCHIAL ULTRASOUND N/A 06/17/2015   Procedure: VIDEO BRONCHOSCOPY WITH ENDOBRONCHIAL ULTRASOUND;  Surgeon: Melrose Nakayama, MD;  Location: [place];  Service: Thoracic;  Laterality: N/A;    SH: Social History   Tobacco Use   Smoking status: Former    Packs/day: 0.50    Years: 40.00    Total pack years: 20.00    Types: Cigarettes    Quit date: 02/13/2015    Years since quitting: 6.8   Smokeless tobacco: Never  Vaping Use   Vaping Use: Never used  Substance Use Topics   Alcohol use: No    Alcohol/week: 0.0 standard drinks of alcohol   Drug use: No  ROS: All other review of systems were reviewed and are negative except what is noted above in HPI  PE: BP (!) 151/75   Pulse 89  GENERAL APPEARANCE:  Well appearing, well developed, well nourished, NAD HEENT:  Atraumatic, normocephalic NECK:  Supple. Trachea midline ABDOMEN:  Soft, non-tender, no masses EXTREMITIES:  Moves all extremities well, without clubbing, cyanosis, or edema NEUROLOGIC:  Alert and oriented x 3, normal gait, CN II-XII grossly intact MENTAL STATUS:  appropriate BACK:  Non-tender to palpation, No CVAT SKIN:  Warm, dry, and intact   Results: Laboratory Data: Lab Results  Component Value Date   WBC 7.3 04/06/2021   HGB 12.3 (L) 04/06/2021   HCT 37.7 (L) 04/06/2021   MCV 95.9 04/06/2021   PLT 253 04/06/2021    Lab Results  Component Value Date   CREATININE 1.09 04/06/2021    Lab Results  Component Value Date   PSA <0.1 08/21/2019   PSA <0.1 04/06/2019   PSA 0.69 10/26/2008    Lab Results  Component Value Date   TESTOSTERONE 313 09/07/2021    Lab Results  Component Value Date   HGBA1C 5.8 (H) 03/28/2021    Urinalysis    Component Value Date/Time   COLORURINE YELLOW 10/04/2019 1506   APPEARANCEUR Clear 12/05/2021 0929   LABSPEC 1.017 10/04/2019 1506   PHURINE 5.0 10/04/2019 1506   GLUCOSEU Negative 12/05/2021 0929   HGBUR NEGATIVE 10/04/2019 1506    BILIRUBINUR Negative 12/05/2021 0929   KETONESUR NEGATIVE 10/04/2019 1506   PROTEINUR Negative 12/05/2021 0929   PROTEINUR 30 (A) 10/04/2019 1506   UROBILINOGEN negative (A) 09/11/2019 1107   UROBILINOGEN 1.0 10/04/2014 2342   NITRITE Negative 12/05/2021 0929   NITRITE NEGATIVE 10/04/2019 1506   LEUKOCYTESUR Negative 12/05/2021 0929   LEUKOCYTESUR MODERATE (A) 10/04/2019 1506    Lab Results  Component Value Date   LABMICR Comment 12/05/2021   WBCUA None seen 06/22/2021   LABEPIT None seen 06/22/2021   MUCUS Present 06/22/2021   BACTERIA None seen 06/22/2021    Pertinent Imaging: No results found for this or any previous visit.  Results for orders placed during the hospital encounter of 04/06/21  US Venous Img Lower Bilateral  Narrative CLINICAL DATA:  Postoperative swelling.  EXAM: BILATERAL LOWER EXTREMITY VENOUS DOPPLER ULTRASOUND  TECHNIQUE: Gray-scale sonography with graded compression, as well as color Doppler and duplex ultrasound were performed to evaluate the lower extremity deep venous systems from the level of the common femoral vein and including the common femoral, femoral, profunda femoral, popliteal and calf veins including the posterior tibial, peroneal and gastrocnemius veins when visible. The superficial great saphenous vein was also interrogated. Spectral Doppler was utilized to evaluate flow at rest and with distal augmentation maneuvers in the common femoral, femoral and popliteal veins.  COMPARISON:  None.  FINDINGS: RIGHT LOWER EXTREMITY  Common Femoral Vein: No evidence of thrombus. Normal compressibility, respiratory phasicity and response to augmentation.  Saphenofemoral Junction: No evidence of thrombus. Normal compressibility and flow on color Doppler imaging.  Profunda Femoral Vein: No evidence of thrombus. Normal compressibility and flow on color Doppler imaging.  Femoral Vein: No evidence of thrombus. Normal  compressibility, respiratory phasicity and response to augmentation.  Popliteal Vein: No evidence of thrombus. Normal compressibility, respiratory phasicity and response to augmentation.  Calf Veins: No evidence of thrombus. Normal compressibility and flow on color Doppler imaging.  Superficial Great Saphenous Vein: No evidence of thrombus. Normal compressibility.  Venous Reflux:  None.  Other Findings:  None.  LEFT LOWER EXTREMITY  Common Femoral Vein: No evidence of thrombus. Normal compressibility, respiratory phasicity and response to augmentation.  Saphenofemoral Junction: No evidence of thrombus. Normal compressibility and flow on color Doppler imaging.  Profunda Femoral Vein: No evidence of thrombus. Normal compressibility and flow on color Doppler imaging.  Femoral Vein: No evidence of thrombus. Normal compressibility, respiratory phasicity and response to augmentation.  Popliteal Vein: No evidence of thrombus. Normal compressibility, respiratory phasicity and response to augmentation.  Calf Veins: No evidence of thrombus. Normal compressibility and flow on color Doppler imaging. There is limited view of the peroneal vein.  Superficial Great Saphenous Vein: No evidence of thrombus. Normal compressibility.  Venous Reflux:  None.  Other Findings:  None.  IMPRESSION: No evidence of deep venous thrombosis in either lower extremity.   Electronically Signed By: Ronney Asters M.D. On: 04/06/2021 17:20  No results found for this or any previous visit.  No results found for this or any previous visit.  Results for orders placed during the hospital encounter of 08/06/14  US Renal  Narrative CLINICAL DATA:  Pain.  Renal cyst.  Prior bladder surgery.  EXAM: RENAL / URINARY TRACT ULTRASOUND COMPLETE  COMPARISON:  CT 02/03/2014.  FINDINGS: Right Kidney:  Length: 12.1 cm. Echogenicity within normal limits. No significant mass or hydronephrosis  visualized. Multiple cysts are noted. Largest measures 2.2 cm. This cyst contains a thin septation and S is most likely benign.  Left Kidney:  Length: 12.9 cm. Echogenicity within normal limits. No significant mass or hydronephrosis visualized. Multiple simple cysts. The largest measures 3.1 cm.  Bladder:  Appears normal for degree of bladder distention. Mild prostate prominence.  IMPRESSION: 1.  Bilateral renal benign-appearing cysts.  2.  Mild prostate prominence.  Bladder nondistended.   Electronically Signed By: [place] On: 08/06/2014 15:01  No valid procedures specified. No results found for this or any previous visit.  Results for orders placed during the hospital encounter of 10/04/19  CT Renal Stone Study  Narrative CLINICAL DATA:  Left flank pain, frequent urination, prostate cancer  EXAM: CT ABDOMEN AND PELVIS WITHOUT CONTRAST  TECHNIQUE: Multidetector CT imaging of the abdomen and pelvis was performed following the standard protocol without IV contrast.  COMPARISON:  02/21/2019  FINDINGS: Lower chest: No acute pleural or parenchymal lung disease.  Hepatobiliary: No focal liver abnormality is seen. Status post cholecystectomy. No biliary dilatation.  Pancreas: Unremarkable. No pancreatic ductal dilatation or surrounding inflammatory changes.  Spleen: Normal in size without focal abnormality.  Adrenals/Urinary Tract: There are stable bilateral renal cortical hypodensities compatible with cysts. There is a stable 3 mm nonobstructing calculus lower pole left kidney reference image 37. No other urinary tract calculi or obstructive uropathy.  Bladder is moderately distended without focal abnormality. The adrenals are unremarkable.  Stomach/Bowel: No bowel obstruction or ileus. Normal appendix right lower quadrant. No bowel wall thickening or inflammatory change.  Vascular/Lymphatic: Aortic atherosclerosis. No enlarged abdominal  or pelvic lymph nodes. Stable mild dilation of the infrarenal abdominal aorta measuring 3.5 cm.  Reproductive: Prostate is not enlarged. Fiduciary markers are identified. Postsurgical changes from TURP.  Other: No abdominal wall hernia or abnormality. No abdominopelvic ascites.  Musculoskeletal: No acute or destructive bony lesions. Reconstructed images demonstrate no additional findings.  IMPRESSION: 1. Stable 3 mm nonobstructing left renal calculus. 2. Stable mild dilation of the infrarenal abdominal aorta measuring 3.5 cm. Recommend followup by ultrasound in 2 years. This recommendation follows ACR consensus guidelines: White Paper of the ACR Incidental  Findings Committee II on Vascular Findings. J Am Coll Radiol 2013; 10:789-794. Aortic aneurysm NOS (ICD10-I71.9) 3. Aortic Atherosclerosis (ICD10-I70.0).   Electronically Signed By: Randa Ngo M.D. On: 10/04/2019 15:24  No results found for this or any previous visit (from the past 24 hour(s)).

## 2021-12-19 NOTE — Patient Instructions (Signed)
Hold Gemtesa Hold Trospium  Resume Flomax

## 2021-12-19 NOTE — Progress Notes (Signed)
Assessment: 1. Urinary retention  2. History of prostate cancer  3. Urge incontinence  4. Recurrent UTI    Plan: Voiding trial successful in the office.  Patient will continue Flomax daily until follow-up with Dr. Jeffie Pollock in 1 month for UA and PVR after labs scheduled before the visit.  Written instructions to hold Gemtesa and trospium given.  He has issues voiding, he will return for PVR and possible replacement of Foley.  Will resume PTNS treatments when appropriate.  Chief Complaint: No chief complaint on file.   HPI: Jonathan [name] is a 81 y.o. male with h/o prostate CA, OAB, and recurrent UTIs who presents for continued evaluation of acute urinary retention.  He is here for voiding trial and has tolerated the Foley well.  PVR greater than 400 mL at recent visit for complaint of significant lower abdominal pressure and frequency.  No gross hematuria or bladder spasms.  He did not start Flomax as previously recommended but has held [place] (Jonathan [name]) samples that he was given by Dr. Jeffie Pollock as well as trospium.  PTNS treatments delayed due to retention.  12/05/21 [name] is a 81 y.o. male with h/o prostate CA, incontinence, recurrent UTIs and retention in the past who presents for evaluation of dysuria and increase in urinary frequency and incontinence over the past several months. Nocturia at least 5-6 times/night. Wearing multiple pull up briefs during the day. No fever, chills, gross hematuria. Pt on Gemtesa and trospium for UUI, but states he has only been taking the [place]. Denies new meds since last visit.   UA= clear PVR=457ml IPSS= 30          QOL=5     10/12/21: Jonathan [name] is here today for PTNS.  His PSA remains <0.1.  He completed EXRT with ADT in 9/19 with the last lupron in 1/21.  His testosterone is up to 313.  His UA is clear.  He has incontinence and wears guards.  His IPSS is 25.  He has nocturia x 3.  He is using the [place] (Jonathan [name]) and trospium.   He continues to use Viagra  for the ED.   He had a PET scan on 7/12 for f/u of his lung cancer.   There was no evidence of recurrent disease.     06/22/21: Jonathan [name] returns today in f/u. He has a history of T3b N0 M0 Gleason 9 prostate cancer that was treated with EXRT and ADT.   He completed EXRT in 9/19.   His final  Lupron injection was given 1/21. His PSA was up to 0.6 in November from an undetectible level.  He has no bone pain or weight loss.  His testosterone level was 310 which was up from 286 in May.   He had a lumbar laminectomy in January and he is recovering from that with improvement in his symptoms.   He continues to have OAB wet and is using depends about 4 daily.   He has been on the PTNS maintenance therapy.  He is out of Gemtesa and his symptoms have worsened with increased nocturia.   He has ED and is using Viagra.  He is not using the injections.  He has had no hematuria or dysuria.   His IPSS is 24.    02/09/21: Jonathan [name] returns today in f/u.  His PSA was <0.1 on 07/28/20.  He didn't get labs prior to the visit.  He has OAB wet and is has been on PTNS maintenance and Gemtesa samples.Jonathan [name]  He has frequent small voids.  He has nocturia x 3. UA is clear today.   He isn't having accidents. He has some dysuria but no hematuria.   He is getting PTNS today.   He has tadalafil and viagra that he uses prn.  He was given prostin and started using it at home and has had success, but he has been given the med by the [place] and he says the needles are too big.  He does have success with the viagra.     Portions of the above documentation were copied from a prior visit for review purposes only.  Allergies: Allergies  Allergen Reactions   Ampicillin Other (See Comments)    Don't work   Ciprofloxacin Other (See Comments)    Doesn't work   Levaquin [Levofloxacin] Other (See Comments)    Doesn't work    PMH: Past Medical History:  Diagnosis Date   Acquired bladder diverticulum    12/ 2012  resection diverticulum done at [facility] in  [place], [place]   Age-related cataract of right eye    scheduled for catarat extraction 09-12-2017   Agent orange exposure    Aneurysm of infrarenal abdominal aorta ([place])    first dx 2017--- last CT 06-11-2017  measures 3.5cm   Anxiety    Chronic constipation    COPD with emphysema ([place])    06--12-2017  per pt no symptoms since Feb 2019   Depression    PTSD   Diabetes mellitus type 2, diet-controlled ([place])    Diabetic neuropathy ([place])    Dyslipidemia    Dyspnea on exertion    Erectile dysfunction    Feeling of incomplete bladder emptying    Gait abnormality 01/06/2020   GERD (gastroesophageal reflux disease)    Gout    09-02-2017 last flare up 3 months ago   Hiatal hernia    History of atrial fibrillation    episode post op right lung lobectomy 07-04-2015   History of bladder stone    History of colon polyps    History of DVT of lower extremity    03/ 2019  bilateral lower extremity (superficial) ---  per treated w/ oral medication    History of gastritis    History of kidney stones    History of radiation therapy 09-14-2015  to 09-21-2015   left upper lung nodule -- 54Gy in 3 fractions (18 Gy per fraction)   Hyperplasia of prostate with lower urinary tract symptoms (LUTS)    Hypertension    Lumbar spinal stenosis    Nephrolithiasis    Neurogenic bladder    Osteoarthritis    ankle, hands   Osteoporosis    Prostate cancer [facility]) urologist-  dr wrenn/  oncologist-  dr Tammi Klippel   dx 05-24-2017-- Stage T2b,  Gleason 4+4,  PSA 22.41,  vol 38cc--- started ADT 04/ 2019,  plan external radiation therapy   Radiation fibrosis of lung ([place])    hx left upper long nodule SBRT 06/ 2017   Squamous cell carcinoma of both lungs ([place]) last CT in epic dated 06-26-2017 no recurrence   dx 03/ 2017  non-small cell SCC via bronchoscopy w/ bx's by dr Roxan Hockey---  s/p  right VATS w/ right lobectomy and node dissection's 07-04-2015 /  pt had SBRT to left upper nodule Stage 1 ([facility])  completed 09-21-2015   Vertigo 2018   Wears dentures    bottom   Wears glasses     PSH: Past Surgical History:  Procedure  Laterality Date   BIOPSY  05/29/2019   Procedure: BIOPSY;  Surgeon: Rogene Houston, MD;  [facility];  Service: Endoscopy;;  gastric ulcer   CARPAL TUNNEL RELEASE Right 07/09/2014   Procedure: RIGHT OPEN CARPAL TUNNEL RELEASE;  Surgeon: Mcarthur Rossetti, MD;  [facility];  Service: Orthopedics;  Laterality: Right;   CHOLECYSTECTOMY N/A 02/24/2014   Procedure: LAPAROSCOPIC CHOLECYSTECTOMY;  Surgeon: Coralie Keens, MD;  Location: [place];  Service: General;  Laterality: N/A;   COLONOSCOPY     CYSTOLITHOTOMY  11/ 2007      [facility] [place] [facility]/18/2018   Procedure: CYSTOSCOPY FLEXIBLE;  Surgeon: Irine Seal, MD;  [facility];  Service: Urology;  Laterality: N/A;   CYSTOSCOPY W/ LITHOLAPAXY / EHL  02-24-2007   dr Janice Norrie  [facility]   dental implant     No teeth at this time waiting for them to be made as of 01-30-19   ESOPHAGOGASTRODUODENOSCOPY (EGD) WITH PROPOFOL N/A 05/29/2019   Procedure: ESOPHAGOGASTRODUODENOSCOPY (EGD) WITH PROPOFOL;  Surgeon: Rogene Houston, MD;  [facility];  Service: Endoscopy;  Laterality: N/A;  [place] IMPLANT N/A 09/05/2017   Procedure: GOLD SEED IMPLANT;  Surgeon: Irine Seal, MD;  Location: [facility];  Service: Urology;  Laterality: N/A;   INSERTION OF SUPRAPUBIC CATHETER N/A 04/18/2018   Procedure: SUPRAPUBIC TUBE CHANGE;  Surgeon: Irine Seal, MD;  [facility];  Service: Urology;  Laterality: N/A;   IR CATHETER TUBE CHANGE  02/24/2018   LUMBAR LAMINECTOMY/DECOMPRESSION MICRODISCECTOMY Right 03/31/2021   Procedure: Bilateral Lumbar Three- Four, Lumbar Four-Five Laminectomy, Decompression;  Surgeon: Ashok Pall, MD;  Location: [place];  Service: Neurosurgery;  Laterality: Right;   SPACE OAR INSTILLATION N/A 09/05/2017   Procedure: SPACE OAR  INSTILLATION;  Surgeon: Irine Seal, MD;  Location: [facility];  Service: Urology;  Laterality: N/A;   TOTAL ANKLE ARTHROPLASTY Right 04/20/2015   Procedure: TOTAL ANKLE ARTHOPLASTY;  Surgeon: Newt Minion, MD;  Location: [place];  Service: Orthopedics;  Laterality: Right;   TRANSTHORACIC ECHOCARDIOGRAM  11/16/2012   ef 55-60%,  grade 1 diastolic dysfunction/  mild dilated ascending aorta/  mild MR/ trivial TR   TRANSURETHRAL RESECTION OF PROSTATE N/A 02/03/2019   Procedure: TRANSURETHRAL RESECTION OF THE PROSTATE (TURP);  Surgeon: Irine Seal, MD;  [facility];  Service: Urology;  Laterality: N/A;   VIDEO ASSISTED THORACOSCOPY (VATS)/ LOBECTOMY Right 07/04/2015   Procedure: VIDEO ASSISTED THORACOSCOPY (VATS)/ RIGHT UPPER LOBECTOMY;  Surgeon: Melrose Nakayama, MD;  Location: [place];  Service: Thoracic;  Laterality: Right;   VIDEO BRONCHOSCOPY N/A 06/17/2015   Procedure: VIDEO BRONCHOSCOPY;  Surgeon: Melrose Nakayama, MD;  Location: [place];  Service: Thoracic;  Laterality: N/A;   VIDEO BRONCHOSCOPY WITH ENDOBRONCHIAL NAVIGATION N/A 06/17/2015   Procedure: VIDEO BRONCHOSCOPY WITH ENDOBRONCHIAL NAVIGATION;  Surgeon: Melrose Nakayama, MD;  Location: [place];  Service: Thoracic;  Laterality: N/A;   VIDEO BRONCHOSCOPY WITH ENDOBRONCHIAL ULTRASOUND N/A 06/17/2015   Procedure: VIDEO BRONCHOSCOPY WITH ENDOBRONCHIAL ULTRASOUND;  Surgeon: Melrose Nakayama, MD;  Location: [place];  Service: Thoracic;  Laterality: N/A;    SH: Social History   Tobacco Use   Smoking status: Former    Packs/day: 0.50    Years: 40.00    Total pack years: 20.00    Types: Cigarettes    Quit date: 02/13/2015    Years since quitting: 6.8   Smokeless tobacco:  Never  Vaping Use   Vaping Use: Never used  Substance Use Topics   Alcohol use: No    Alcohol/week: 0.0 standard drinks of alcohol   Drug use: No    ROS: All other review of systems were reviewed and are negative except what is noted  above in HPI  PE: BP (!) 151/75   Pulse 89  GENERAL APPEARANCE:  Well appearing, well developed, well nourished, NAD HEENT:  Atraumatic, normocephalic NECK:  Supple. Trachea midline ABDOMEN:  Soft, non-tender, no masses EXTREMITIES:  Moves all extremities well, without clubbing, cyanosis, or edema NEUROLOGIC:  Alert and oriented x 3, normal gait, CN II-XII grossly intact MENTAL STATUS:  appropriate BACK:  Non-tender to palpation, No CVAT SKIN:  Warm, dry, and intact   Results: Laboratory Data: Lab Results  Component Value Date   WBC 7.3 04/06/2021   HGB 12.3 (L) 04/06/2021   HCT 37.7 (L) 04/06/2021   MCV 95.9 04/06/2021   PLT 253 04/06/2021    Lab Results  Component Value Date   CREATININE 1.09 04/06/2021    Lab Results  Component Value Date   PSA <0.1 08/21/2019   PSA <0.1 04/06/2019   PSA 0.69 10/26/2008    Lab Results  Component Value Date   TESTOSTERONE 313 09/07/2021    Lab Results  Component Value Date   HGBA1C 5.8 (H) 03/28/2021    Urinalysis    Component Value Date/Time   COLORURINE YELLOW 10/04/2019 1506   APPEARANCEUR Clear 12/05/2021 0929   LABSPEC 1.017 10/04/2019 1506   PHURINE 5.0 10/04/2019 1506   GLUCOSEU Negative 12/05/2021 0929   HGBUR NEGATIVE 10/04/2019 1506   BILIRUBINUR Negative 12/05/2021 0929   KETONESUR NEGATIVE 10/04/2019 1506   PROTEINUR Negative 12/05/2021 0929   PROTEINUR 30 (A) 10/04/2019 1506   UROBILINOGEN negative (A) 09/11/2019 1107   UROBILINOGEN 1.0 10/04/2014 2342   NITRITE Negative 12/05/2021 0929   NITRITE NEGATIVE 10/04/2019 1506   LEUKOCYTESUR Negative 12/05/2021 0929   LEUKOCYTESUR MODERATE (A) 10/04/2019 1506    Lab Results  Component Value Date   LABMICR Comment 12/05/2021   WBCUA None seen 06/22/2021   LABEPIT None seen 06/22/2021   MUCUS Present 06/22/2021   BACTERIA None seen 06/22/2021    Pertinent Imaging: No results found for this or any previous visit.  Results for orders placed during  the hospital encounter of 04/06/21  US Venous Img Lower Bilateral  Narrative CLINICAL DATA:  Postoperative swelling.  EXAM: BILATERAL LOWER EXTREMITY VENOUS DOPPLER ULTRASOUND  TECHNIQUE: Gray-scale sonography with graded compression, as well as color Doppler and duplex ultrasound were performed to evaluate the lower extremity deep venous systems from the level of the common femoral vein and including the common femoral, femoral, profunda femoral, popliteal and calf veins including the posterior tibial, peroneal and gastrocnemius veins when visible. The superficial great saphenous vein was also interrogated. Spectral Doppler was utilized to evaluate flow at rest and with distal augmentation maneuvers in the common femoral, femoral and popliteal veins.  COMPARISON:  None.  FINDINGS: RIGHT LOWER EXTREMITY  Common Femoral Vein: No evidence of thrombus. Normal compressibility, respiratory phasicity and response to augmentation.  Saphenofemoral Junction: No evidence of thrombus. Normal compressibility and flow on color Doppler imaging.  Profunda Femoral Vein: No evidence of thrombus. Normal compressibility and flow on color Doppler imaging.  Femoral Vein: No evidence of thrombus. Normal compressibility, respiratory phasicity and response to augmentation.  Popliteal Vein: No evidence of thrombus. Normal compressibility, respiratory phasicity and response to augmentation.  Calf Veins: No evidence of thrombus. Normal compressibility and flow on color Doppler imaging.  Superficial Great Saphenous Vein: No evidence of thrombus. Normal compressibility.  Venous Reflux:  None.  Other Findings:  None.  LEFT LOWER EXTREMITY  Common Femoral Vein: No evidence of thrombus. Normal compressibility, respiratory phasicity and response to augmentation.  Saphenofemoral Junction: No evidence of thrombus. Normal compressibility and flow on color Doppler imaging.  Profunda Femoral  Vein: No evidence of thrombus. Normal compressibility and flow on color Doppler imaging.  Femoral Vein: No evidence of thrombus. Normal compressibility, respiratory phasicity and response to augmentation.  Popliteal Vein: No evidence of thrombus. Normal compressibility, respiratory phasicity and response to augmentation.  Calf Veins: No evidence of thrombus. Normal compressibility and flow on color Doppler imaging. There is limited view of the peroneal vein.  Superficial Great Saphenous Vein: No evidence of thrombus. Normal compressibility.  Venous Reflux:  None.  Other Findings:  None.  IMPRESSION: No evidence of deep venous thrombosis in either lower extremity.   Electronically Signed By: Ronney Asters M.D. On: 04/06/2021 17:20  No results found for this or any previous visit.  No results found for this or any previous visit.  Results for orders placed during the hospital encounter of 08/06/14  US Renal  Narrative CLINICAL DATA:  Pain.  Renal cyst.  Prior bladder surgery.  EXAM: RENAL / URINARY TRACT ULTRASOUND COMPLETE  COMPARISON:  CT 02/03/2014.  FINDINGS: Right Kidney:  Length: 12.1 cm. Echogenicity within normal limits. No significant mass or hydronephrosis visualized. Multiple cysts are noted. Largest measures 2.2 cm. This cyst contains a thin septation and S is most likely benign.  Left Kidney:  Length: 12.9 cm. Echogenicity within normal limits. No significant mass or hydronephrosis visualized. Multiple simple cysts. The largest measures 3.1 cm.  Bladder:  Appears normal for degree of bladder distention. Mild prostate prominence.  IMPRESSION: 1.  Bilateral renal benign-appearing cysts.  2.  Mild prostate prominence.  Bladder nondistended.   Electronically Signed By: [place] On: 08/06/2014 15:01  No valid procedures specified. No results found for this or any previous visit.  Results for orders placed during the hospital  encounter of 10/04/19  CT Renal Stone Study  Narrative CLINICAL DATA:  Left flank pain, frequent urination, prostate cancer  EXAM: CT ABDOMEN AND PELVIS WITHOUT CONTRAST  TECHNIQUE: Multidetector CT imaging of the abdomen and pelvis was performed following the standard protocol without IV contrast.  COMPARISON:  02/21/2019  FINDINGS: Lower chest: No acute pleural or parenchymal lung disease.  Hepatobiliary: No focal liver abnormality is seen. Status post cholecystectomy. No biliary dilatation.  Pancreas: Unremarkable. No pancreatic ductal dilatation or surrounding inflammatory changes.  Spleen: Normal in size without focal abnormality.  Adrenals/Urinary Tract: There are stable bilateral renal cortical hypodensities compatible with cysts. There is a stable 3 mm nonobstructing calculus lower pole left kidney reference image 37. No other urinary tract calculi or obstructive uropathy.  Bladder is moderately distended without focal abnormality. The adrenals are unremarkable.  Stomach/Bowel: No bowel obstruction or ileus. Normal appendix right lower quadrant. No bowel wall thickening or inflammatory change.  Vascular/Lymphatic: Aortic atherosclerosis. No enlarged abdominal or pelvic lymph nodes. Stable mild dilation of the infrarenal abdominal aorta measuring 3.5 cm.  Reproductive: Prostate is not enlarged. Fiduciary markers are identified. Postsurgical changes from TURP.  Other: No abdominal wall hernia or abnormality. No abdominopelvic ascites.  Musculoskeletal: No acute or destructive bony lesions. Reconstructed images demonstrate no additional findings.  IMPRESSION: 1. Stable 3  mm nonobstructing left renal calculus. 2. Stable mild dilation of the infrarenal abdominal aorta measuring 3.5 cm. Recommend followup by ultrasound in 2 years. This recommendation follows ACR consensus guidelines: White Paper of the ACR Incidental Findings Committee II on Vascular  Findings. J Am Coll Radiol 2013; 10:789-794. Aortic aneurysm NOS (ICD10-I71.9) 3. Aortic Atherosclerosis (ICD10-I70.0).   Electronically Signed By: Randa Ngo M.D. On: 10/04/2019 15:24  No results found for this or any previous visit (from the past 24 hour(s)).

## 2021-12-19 NOTE — Progress Notes (Signed)
Fill and Pull Catheter Removal  Patient is present today for a catheter removal.  Patient was cleaned and prepped in a sterile fashion 253ml of sterile water/ saline was instilled into the bladder when the patient felt the urge to urinate. 73ml of water was then drained from the balloon.  A 16FR foley cath was removed from the bladder no complications were noted .  Patient as then given some time to void on their own.  Patient can void  269ml on their own after some time.  Patient tolerated well.  Performed by: Marisue [place], CMA  Follow up/ Additional notes: Patient come back into office this afternoon for a PVR of the bladder

## 2021-12-20 ENCOUNTER — Telehealth: Payer: Self-pay | Admitting: *Deleted

## 2021-12-20 NOTE — Chronic Care Management (AMB) (Signed)
  Care Coordination   Note   12/20/2021 Name: LEVESTER WALDRIDGE MRN: [id number] DOB: May 17, 1940  FRANCE NOYCE is a 81 y.o. year old male who sees [name], [place] for primary care. I reached out to [name] by phone today to reschedule for care coordination services.   Follow up plan:  Unsuccessful telephone outreach attempt made. A HIPAA compliant phone message was left for the patient providing contact information and requesting a return call.  Encounter Outcome:  No Answer  [place]  Direct Dial: [phone]

## 2021-12-25 DIAGNOSIS — I10 Essential (primary) hypertension: Secondary | ICD-10-CM | POA: Diagnosis not present

## 2021-12-25 DIAGNOSIS — Z6827 Body mass index (BMI) 27.0-27.9, adult: Secondary | ICD-10-CM | POA: Diagnosis not present

## 2021-12-25 DIAGNOSIS — Z0001 Encounter for general adult medical examination with abnormal findings: Secondary | ICD-10-CM | POA: Diagnosis not present

## 2021-12-25 DIAGNOSIS — E1143 Type 2 diabetes mellitus with diabetic autonomic (poly)neuropathy: Secondary | ICD-10-CM | POA: Diagnosis not present

## 2021-12-26 NOTE — Chronic Care Management (AMB) (Signed)
  Care Coordination   Note   12/26/2021 Name: Jonathan [name] MRN: [id number] DOB: 01-18-1941  Jonathan [name] is a 81 y.o. year old male who sees [name], [place] for primary care. I reached out to [name] by phone today to reschedule care coordination services.   Follow up plan:  Telephone appointment with care coordination team member scheduled for:  01/04/22  Encounter Outcome:  Pt. Scheduled  [place]  Direct Dial: [phone]

## 2022-01-04 ENCOUNTER — Ambulatory Visit: Payer: Self-pay | Admitting: *Deleted

## 2022-01-04 NOTE — Patient Outreach (Signed)
  Care Coordination   01/04/2022 Name: Jonathan [name] MRN: [id number] DOB: 1940-04-30   Care Coordination Outreach Attempts:  An unsuccessful telephone outreach was attempted for a scheduled appointment today. This was the 2nd unsuccessful outreach for a scheduled appointment.  Follow Up Plan:  Additional outreach attempts will be made to offer the patient care coordination information and services. Left HIPAA compliant voicemail requesting return call today with any urgent questions or concerns. Forwarding to scheduling care guide for outreach and rescheduling of Telephone Care Coordination visit with Valente David, RN Care Coordinator [phone])  Encounter Outcome:  No Answer  Care Coordination Interventions Activated:  No   Care Coordination Interventions:  No, not indicated    [name], BSN, RN-BC RN Care Coordinator [place]: [phone] Main #: [phone]

## 2022-01-09 ENCOUNTER — Ambulatory Visit: Payer: Self-pay | Admitting: *Deleted

## 2022-01-09 NOTE — Patient Outreach (Signed)
  Care Coordination   01/09/2022 Name: Jonathan [name] MRN: [id number] DOB: [birth year]   Care Coordination Outreach Attempts:  An unsuccessful telephone outreach was attempted for a scheduled appointment today.  Follow Up Plan:  Additional outreach attempts will be made to offer the patient care coordination information and services.   Encounter Outcome:  No Answer  Care Coordination Interventions Activated:  No   Care Coordination Interventions:  No, not indicated    Valente David, RN, MSN, [facility] Care Management Care Management Coordinator [phone]

## 2022-01-11 DIAGNOSIS — Z23 Encounter for immunization: Secondary | ICD-10-CM | POA: Diagnosis not present

## 2022-01-12 ENCOUNTER — Other Ambulatory Visit: Payer: TRICARE For Life (TFL)

## 2022-01-12 DIAGNOSIS — Z8546 Personal history of malignant neoplasm of prostate: Secondary | ICD-10-CM

## 2022-01-13 LAB — TESTOSTERONE: Testosterone: 313 ng/dL (ref 264–916)

## 2022-01-13 LAB — PSA: Prostate Specific Ag, Serum: <0.1 ng/mL (ref 0.0–4.0)

## 2022-01-18 ENCOUNTER — Encounter: Payer: Self-pay | Admitting: Urology

## 2022-01-18 ENCOUNTER — Ambulatory Visit (INDEPENDENT_AMBULATORY_CARE_PROVIDER_SITE_OTHER): Payer: Medicare Other | Admitting: Urology

## 2022-01-18 VITALS — BP 124/85 | HR 99

## 2022-01-18 DIAGNOSIS — R339 Retention of urine, unspecified: Secondary | ICD-10-CM | POA: Diagnosis not present

## 2022-01-18 DIAGNOSIS — N393 Stress incontinence (female) (male): Secondary | ICD-10-CM | POA: Diagnosis not present

## 2022-01-18 DIAGNOSIS — N390 Urinary tract infection, site not specified: Secondary | ICD-10-CM

## 2022-01-18 DIAGNOSIS — N3941 Urge incontinence: Secondary | ICD-10-CM | POA: Diagnosis not present

## 2022-01-18 DIAGNOSIS — R300 Dysuria: Secondary | ICD-10-CM

## 2022-01-18 DIAGNOSIS — Z8546 Personal history of malignant neoplasm of prostate: Secondary | ICD-10-CM | POA: Diagnosis not present

## 2022-01-18 DIAGNOSIS — N5201 Erectile dysfunction due to arterial insufficiency: Secondary | ICD-10-CM

## 2022-01-18 DIAGNOSIS — Z87440 Personal history of urinary (tract) infections: Secondary | ICD-10-CM

## 2022-01-18 LAB — BLADDER SCAN AMB NON-IMAGING: Scan Result: 17

## 2022-01-18 MED ORDER — SULFAMETHOXAZOLE-TRIMETHOPRIM 800-160 MG PO TABS: 1.0000 | ORAL_TABLET | Freq: Two times a day (BID) | ORAL | 0 refills | Status: DC

## 2022-01-18 NOTE — Progress Notes (Signed)
post void residual=17

## 2022-01-18 NOTE — Progress Notes (Signed)
Subjective:  1. History of prostate cancer   2. Stress incontinence, male   3. Urge incontinence   4. Erectile dysfunction due to arterial insufficiency   5. Recurrent UTI   6. Urinary retention     01/18/22: Jonathan [name] returns today in f/u for the history below.  His PSA remains <0.1 with a T of 313.  He has chronic incontinence and had been on PTNS but that has been held.  He is having some enuresis.  He is off of  Gemtesa and Trospium after presenting with retention on 12/05/21 but passed a voiding trial on 12/19/21.  His PVR is 49ml today.   He is having some initial dysuria that is chronic.  He was started on Tamsulosin for the retention.  He off of daily tadalafil but is having success on Viagra for ED.  He is on oxycodone for chronic pain.    10/12/21: Jonathan [name] is here today for PTNS.  His PSA remains <0.1.  He completed EXRT with ADT in 9/19 with the last lupron in 1/21.  His testosterone is up to 313.  His UA is clear.  He has incontinence and wears guards.  His IPSS is 25.  He has nocturia x 3.  He is using the [place] and trospium.   He continues to use Viagra for the ED.   He had a PET scan on 7/12 for f/u of his lung cancer.   There was no evidence of recurrent disease.   06/22/21: Jonathan [name] returns today in f/u. He has a history of T3b N0 M0 Gleason 9 prostate cancer that was treated with EXRT and ADT.   He completed EXRT in 9/19.   His final  Lupron injection was given 1/21. His PSA was up to 0.6 in November from an undetectible level.  He has no bone pain or weight loss.  His testosterone level was 310 which was up from 286 in May.   He had a lumbar laminectomy in January and he is recovering from that with improvement in his symptoms.   He continues to have OAB wet and is using depends about 4 daily.   He has been on the PTNS maintenance therapy.  He is out of Gemtesa and his symptoms have worsened with increased nocturia.   He has ED and is using Viagra.  He is not using the injections.  He has  had no hematuria or dysuria.   His IPSS is 24.    02/09/21: Jonathan [name] returns today in f/u.  His PSA was <0.1 on 07/28/20.  He didn't get labs prior to the visit.  He has OAB wet and is has been on PTNS maintenance and Gemtesa samples..  He has frequent small voids.  He has nocturia x 3. UA is clear today.   He isn't having accidents. He has some dysuria but no hematuria.   He is getting PTNS today.   He has tadalafil and viagra that he uses prn.  He was given prostin and started using it at home and has had success, but he has been given the med by the [place] and he says the needles are too big.  He does have success with the viagra.                    ROS:  ROS:  A complete review of systems was performed.  All systems are negative except for pertinent findings as noted.   Review of Systems  Neurological:  Positive for  sensory change.    Allergies  Allergen Reactions   Ampicillin Other (See Comments)    Don't work   Ciprofloxacin Other (See Comments)    Doesn't work   Levaquin [Levofloxacin] Other (See Comments)    Doesn't work    Outpatient Encounter Medications as of 01/18/2022  Medication Sig   sulfamethoxazole-trimethoprim (BACTRIM DS) 800-160 MG tablet Take 1 tablet by mouth every 12 (twelve) hours.   albuterol (PROVENTIL) (2.5 MG/3ML) 0.083% nebulizer solution Take 3 mLs (2.5 mg total) by nebulization every 6 (six) hours as needed for shortness of breath.   atenolol-chlorthalidone (TENORETIC) 50-25 MG tablet Take 1 tablet by mouth at bedtime.    atorvastatin (LIPITOR) 20 MG tablet Take 20 mg by mouth daily.   capsaicin (ZOSTRIX) 0.025 % cream Apply 1 application topically daily as needed (Skin Rash).   diclofenac Sodium (VOLTAREN) 1 % GEL Apply 2 g topically 4 (four) times daily as needed (pain).   donepezil (ARICEPT) 5 MG tablet Take 5 mg by mouth daily.   escitalopram (LEXAPRO) 5 MG tablet Take 5 mg by mouth daily.   FREESTYLE LITE test strip    furosemide (LASIX) 20 MG  tablet Take 1 tablet (20 mg total) by mouth daily for 7 days.   gabapentin (NEURONTIN) 800 MG tablet Take 800 mg by mouth daily.   hydroquinone 4 % cream Apply 1 application topically daily as needed (Skin Rash).   ibuprofen (ADVIL) 600 MG tablet Take 600 mg by mouth every 6 (six) hours as needed for headache.   Incontinence Supply Disposable (FITTED BRIEFS MAXIMUM XL) MISC Use briefs as needed daily for incontinence   Lancets (FREESTYLE) lancets    LANTUS SOLOSTAR 100 UNIT/ML Solostar Pen Inject 2.5 Units into the skin at bedtime.   latanoprost (XALATAN) 0.005 % ophthalmic solution Place 1 drop into both eyes at bedtime.   lidocaine (LIDODERM) 5 % Place 1 patch onto the skin daily. Remove & Discard patch within 12 hours or as directed by MD   LINZESS 72 MCG capsule Take 72 mcg by mouth daily before breakfast.   meclizine (ANTIVERT) 12.5 MG tablet Take 6.25-12.5 mg by mouth every 6 (six) hours as needed for dizziness.   meloxicam (MOBIC) 7.5 MG tablet Take 1 tablet (7.5 mg total) by mouth daily.   methocarbamol (ROBAXIN) 500 MG tablet Take 1 tablet (500 mg total) by mouth 2 (two) times daily as needed for muscle spasms.   metoCLOPramide (REGLAN) 5 MG tablet Take 5 mg by mouth daily as needed for vomiting or nausea.   mupirocin cream (BACTROBAN) 2 % Apply 1 application topically 2 (two) times daily as needed (wound care).   mupirocin ointment (BACTROBAN) 2 % Apply 1 application topically 2 (two) times daily as needed (Rash).   nystatin ointment (MYCOSTATIN) 1 application daily as needed (Skin Rash).   nystatin-triamcinolone ointment (MYCOLOG) Apply 1 application topically 2 (two) times daily. To groins   Oxycodone HCl 20 MG TABS Take 4 mg by mouth See admin instructions. Five times a day as needed   pantoprazole (PROTONIX) 20 MG tablet Take 20 mg by mouth daily as needed for heartburn or indigestion.   predniSONE (DELTASONE) 20 MG tablet Take 20 mg by mouth daily as needed (unknown).   psyllium  (METAMUCIL SMOOTH TEXTURE) 58.6 % powder Take 1 packet by mouth daily.   RHOPRESSA 0.02 % SOLN Place 1 drop into the right eye at bedtime.   ROCKLATAN 0.02-0.005 % SOLN Place 1 drop into both eyes at  bedtime.   SPIRIVA HANDIHALER 18 MCG inhalation capsule 18 mcg 2 (two) times daily.   tamsulosin (FLOMAX) 0.4 MG CAPS capsule Take 1 capsule (0.4 mg total) by mouth daily.   terbinafine (LAMISIL) 250 MG tablet Take 250 mg by mouth 2 (two) times daily.   timolol (TIMOPTIC) 0.5 % ophthalmic solution Place 1 drop into both eyes 2 (two) times daily as needed (eye pressure).   VIAGRA 100 MG tablet Take 1 tablet (100 mg total) by mouth daily as needed for erectile dysfunction.   [DISCONTINUED] tadalafil (CIALIS) 5 MG tablet Take 1 tablet (5 mg total) by mouth daily as needed for erectile dysfunction.   No facility-administered encounter medications on file as of 01/18/2022.    Past Medical History:  Diagnosis Date   Acquired bladder diverticulum    12/ 2012  resection diverticulum done at [facility] in [place], [place]   Age-related cataract of right eye    scheduled for catarat extraction 09-12-2017   Agent orange exposure    Aneurysm of infrarenal abdominal aorta ([place])    first dx 2017--- last CT 06-11-2017  measures 3.5cm   Anxiety    Chronic constipation    COPD with emphysema ([place])    06--12-2017  per pt no symptoms since Feb 2019   Depression    PTSD   Diabetes mellitus type 2, diet-controlled ([place])    Diabetic neuropathy ([place])    Dyslipidemia    Dyspnea on exertion    Erectile dysfunction    Feeling of incomplete bladder emptying    Gait abnormality 01/06/2020   GERD (gastroesophageal reflux disease)    Gout    09-02-2017 last flare up 3 months ago   Hiatal hernia    History of atrial fibrillation    episode post op right lung lobectomy 07-04-2015   History of bladder stone    History of colon polyps    History of DVT of lower extremity    03/ 2019  bilateral lower extremity  (superficial) ---  per treated w/ oral medication    History of gastritis    History of kidney stones    History of radiation therapy 09-14-2015  to 09-21-2015   left upper lung nodule -- 54Gy in 3 fractions (18 Gy per fraction)   Hyperplasia of prostate with lower urinary tract symptoms (LUTS)    Hypertension    Lumbar spinal stenosis    Nephrolithiasis    Neurogenic bladder    Osteoarthritis    ankle, hands   Osteoporosis    Prostate cancer [facility]) urologist-  dr Antonin Meininger/  oncologist-  dr Tammi Klippel   dx 05-24-2017-- Stage T2b,  Gleason 4+4,  PSA 22.41,  vol 38cc--- started ADT 04/ 2019,  plan external radiation therapy   Radiation fibrosis of lung ([place])    hx left upper long nodule SBRT 06/ 2017   Squamous cell carcinoma of both lungs ([place]) last CT in epic dated 06-26-2017 no recurrence   dx 03/ 2017  non-small cell SCC via bronchoscopy w/ bx's by dr Roxan Hockey---  s/p  right VATS w/ right lobectomy and node dissection's 07-04-2015 /  pt had SBRT to left upper nodule Stage 1 ([facility]) completed 09-21-2015   Vertigo 2018   Wears dentures    bottom   Wears glasses     Past Surgical History:  Procedure Laterality Date   BIOPSY  05/29/2019   Procedure: BIOPSY;  Surgeon: Rogene Houston, MD;  [facility];  Service: Endoscopy;;  gastric ulcer   CARPAL TUNNEL RELEASE Right 07/09/2014   Procedure: RIGHT OPEN CARPAL TUNNEL RELEASE;  Surgeon: Mcarthur Rossetti, MD;  [facility];  Service: Orthopedics;  Laterality: Right;   CHOLECYSTECTOMY N/A 02/24/2014   Procedure: LAPAROSCOPIC CHOLECYSTECTOMY;  Surgeon: Coralie Keens, MD;  Location: [place];  Service: General;  Laterality: N/A;   COLONOSCOPY     CYSTOLITHOTOMY  11/ 2007      [facility] [place] [facility]/18[facility]2018   Procedure: CYSTOSCOPY FLEXIBLE;  Surgeon: Irine Seal, MD;  Location: [facility];  Service: Urology;  Laterality: N/A;   CYSTOSCOPY W/ LITHOLAPAXY / EHL  02-24-2007   dr Janice Norrie   [facility]   dental implant     No teeth at this time waiting for them to be made as of 01-30-19   ESOPHAGOGASTRODUODENOSCOPY (EGD) WITH PROPOFOL N/A 05/29/2019   Procedure: ESOPHAGOGASTRODUODENOSCOPY (EGD) WITH PROPOFOL;  Surgeon: Rogene Houston, MD;  [facility];  Service: Endoscopy;  Laterality: N/A;  [place] IMPLANT N/A 09/05/2017   Procedure: GOLD SEED IMPLANT;  Surgeon: Irine Seal, MD;  Location: [facility];  Service: Urology;  Laterality: N/A;   INSERTION OF SUPRAPUBIC CATHETER N/A 04/18/2018   Procedure: SUPRAPUBIC TUBE CHANGE;  Surgeon: Irine Seal, MD;  [facility];  Service: Urology;  Laterality: N/A;   IR CATHETER TUBE CHANGE  02/24/2018   LUMBAR LAMINECTOMY/DECOMPRESSION MICRODISCECTOMY Right 03/31/2021   Procedure: Bilateral Lumbar Three- Four, Lumbar Four-Five Laminectomy, Decompression;  Surgeon: Ashok Pall, MD;  Location: [place];  Service: Neurosurgery;  Laterality: Right;   SPACE OAR INSTILLATION N/A 09/05/2017   Procedure: SPACE OAR INSTILLATION;  Surgeon: Irine Seal, MD;  Location[facility];  Service: Urology;  Laterality: N/A;   TOTAL ANKLE ARTHROPLASTY Right 04/20/2015   Procedure: TOTAL ANKLE ARTHOPLASTY;  Surgeon: Newt Minion, MD;  Location: [place];  Service: Orthopedics;  Laterality: Right;   TRANSTHORACIC ECHOCARDIOGRAM  11/16/2012   ef 55-60%,  grade 1 diastolic dysfunction/  mild dilated ascending aorta/  mild MR/ trivial TR   TRANSURETHRAL RESECTION OF PROSTATE N/A 02/03/2019   Procedure: TRANSURETHRAL RESECTION OF THE PROSTATE (TURP);  Surgeon: Irine Seal, MD;  [facility];  Service: Urology;  Laterality: N/A;   VIDEO ASSISTED THORACOSCOPY (VATS)/ LOBECTOMY Right 07/04/2015   Procedure: VIDEO ASSISTED THORACOSCOPY (VATS)/ RIGHT UPPER LOBECTOMY;  Surgeon: Melrose Nakayama, MD;  Location: [place];  Service: Thoracic;  Laterality: Right;   VIDEO BRONCHOSCOPY N/A 06/17/2015   Procedure: VIDEO BRONCHOSCOPY;   Surgeon: Melrose Nakayama, MD;  Location: [place];  Service: Thoracic;  Laterality: N/A;   VIDEO BRONCHOSCOPY WITH ENDOBRONCHIAL NAVIGATION N/A 06/17/2015   Procedure: VIDEO BRONCHOSCOPY WITH ENDOBRONCHIAL NAVIGATION;  Surgeon: Melrose Nakayama, MD;  Location: [place];  Service: Thoracic;  Laterality: N/A;   VIDEO BRONCHOSCOPY WITH ENDOBRONCHIAL ULTRASOUND N/A 06/17/2015   Procedure: VIDEO BRONCHOSCOPY WITH ENDOBRONCHIAL ULTRASOUND;  Surgeon: Melrose Nakayama, MD;  [facility];  Service: Thoracic;  Laterality: N/A;    Social History   Socioeconomic History   Marital status: Married    Spouse name: Jerl Santos   Number of children: 2   Years of education: college   Highest education level: Not on file  Occupational History   Occupation: retired  Tobacco Use   Smoking status: Former    Packs/day: 0.50    Years: 40.00    Total pack years: 20.00    Types: Cigarettes  Quit date: 02/13/2015    Years since quitting: 6.9   Smokeless tobacco: Never  Vaping Use   Vaping Use: Never used  Substance and Sexual Activity   Alcohol use: No    Alcohol/week: 0.0 standard drinks of alcohol   Drug use: No   Sexual activity: Not Currently  Other Topics Concern   Not on file  Social History Narrative   Lives with wife, daughter and son in law temporarily to help them out   Right Handed   Drinks caffeine occassionally   Social Determinants of Health   Financial Resource Strain: Low Risk  (11/16/2021)   Overall Financial Resource Strain (CARDIA)    Difficulty of Paying Living Expenses: Not hard at all  Food Insecurity: No Food Insecurity (11/16/2021)   Hunger Vital Sign    Worried About Running Out of Food in the Last Year: Never true    Ran Out of Food in the Last Year: Never true  Transportation Needs: No Transportation Needs (11/16/2021)   PRAPARE - Hydrologist (Medical): No    Lack of Transportation (Non-Medical): No  Physical Activity: Not on  file  Stress: Not on file  Social Connections: Not on file  Intimate Partner Violence: Not on file    Family History  Problem Relation Age of Onset   Cancer Father        Asbestos   Colon cancer Neg Hx    Colon polyps Neg Hx    Kidney disease Neg Hx    Esophageal cancer Neg Hx    Gallbladder disease Neg Hx    Heart disease Neg Hx    Diabetes Neg Hx        Objective: Vitals:   01/18/22 0903  BP: 124/85  Pulse: 99      Physical Exam Vitals reviewed.  Constitutional:      Appearance: Normal appearance.  Neurological:     Mental Status: He is alert.     Lab Results:  No results found for this or any previous visit (from the past 24 hour(s)).   UA is clear.   BMET No results for input(s): "NA", "K", "CL", "CO2", "GLUCOSE", "BUN", "CREATININE", "CALCIUM" in the last 72 hours. PSA Lab Results  Component Value Date   PSA1 <0.1 01/12/2022   PSA1 <0.1 09/07/2021   PSA1 0.6 02/09/2021    PSA  Date Value Ref Range Status  08/21/2019 <0.1 < OR = 4.0 ng/mL Final    Comment:    The total PSA value from this assay system is  standardized against the WHO standard. The test  result will be approximately 20% lower when compared  to the equimolar-standardized total PSA (Beckman  Coulter). Comparison of serial PSA results should be  interpreted with this fact in mind. . This test was performed using the Siemens  chemiluminescent method. Values obtained from  different assay methods cannot be used interchangeably. PSA levels, regardless of value, should not be interpreted as absolute evidence of the presence or absence of disease.   04/06/2019 <0.1 < OR = 4.0 ng/mL Final    Comment:    The total PSA value from this assay system is  standardized against the WHO standard. The test  result will be approximately 20% lower when compared  to the equimolar-standardized total PSA (Beckman  Coulter). Comparison of serial PSA results should be  interpreted with this  fact in mind. . This test was performed using the Siemens  chemiluminescent method. Values  obtained from  different assay methods cannot be used interchangeably. PSA levels, regardless of value, should not be interpreted as absolute evidence of the presence or absence of disease.   10/26/2008 0.69 0.10 - 4.00 ng/mL Final    Comment:    See lab report for associated comment(s)   Testosterone  Date Value Ref Range Status  01/12/2022 313 264 - 916 ng/dL Final    Comment:    Adult male reference interval is based on a population of healthy nonobese males (BMI <30) between 80 and 68 years old. [place], [place] [phone]. PMID: 25427062.   09/07/2021 313 264 - 916 ng/dL Final    Comment:    Adult male reference interval is based on a population of healthy nonobese males (BMI <30) between 18 and 27 years old. [place], [place] [phone]. PMID: 37106269.   02/09/2021 310 264 - 916 ng/dL Final    Comment:    Adult male reference interval is based on a population of healthy nonobese males (BMI <30) between 17 and 82 years old. [place], [place] [phone]. PMID: 18299371.    UA looks infected today. .    Studies/Results:   Assessment & Plan: Prostate cancer.   His  PSA remains undetectible.  His T is normal.   OAB with incontinence with an elevated PVR.  He will resume the  PTNS.  Recurrent UTI's.  UA looks infected today.  I will get a culture.    ED.  He will continue the Viagra.    Meds ordered this encounter  Medications   sulfamethoxazole-trimethoprim (BACTRIM DS) 800-160 MG tablet    Sig: Take 1 tablet by mouth every 12 (twelve) hours.    Dispense:  14 tablet    Refill:  0     Orders Placed This Encounter  Procedures   Urine Culture   Urinalysis, Routine w reflex microscopic   PSA    Standing Status:   Future    Standing Expiration Date:   01/19/2023   BLADDER SCAN AMB NON-IMAGING        Return in about 6 months  (around 07/20/2022) for with PSA and testosterone.   Resume monthly PTNS in 2 weeks.  UA on return. .   CCLanelle Bal, PA-C      Irine Seal 01/18/2022 Patient ID: Jonathan [name], male   DOB: 1940-06-25, 81 y.o.   MRN: [id number] Patient ID: Jonathan [name], male   DOB: [birth year], 81 y.o.   MRN: [id number] Patient ID: Jonathan [name], male   DOB: [birth year], 81 y.o.   MRN: [id number]

## 2022-01-19 LAB — URINALYSIS, ROUTINE W REFLEX MICROSCOPIC
Bilirubin, UA: NEGATIVE
Glucose, UA: NEGATIVE
Ketones, UA: NEGATIVE
Nitrite, UA: POSITIVE — AB
Specific Gravity, UA: 1.025 (ref 1.005–1.030)
Urobilinogen, Ur: 0.2 mg/dL (ref 0.2–1.0)
pH, UA: 5.5 (ref 5.0–7.5)

## 2022-01-19 LAB — MICROSCOPIC EXAMINATION: WBC, UA: >30 /hpf — AB (ref 0–5)

## 2022-01-23 DIAGNOSIS — Z20828 Contact with and (suspected) exposure to other viral communicable diseases: Secondary | ICD-10-CM | POA: Diagnosis not present

## 2022-01-24 LAB — URINE CULTURE

## 2022-01-26 ENCOUNTER — Telehealth: Payer: Self-pay | Admitting: *Deleted

## 2022-01-26 NOTE — Telephone Encounter (Signed)
  Care Coordination Note  01/26/2022 Name: NABOR THOMANN MRN: [id number] DOB: 08/19/1940  YUNIOR JAIN is a 81 y.o. year old male who is a primary care patient of [name], Hershal Coria and is actively engaged with the care management team. I reached out to [name] by phone today to assist with re-scheduling a follow up visit with the RN Case Manager  Follow up plan: Unsuccessful telephone outreach attempt made. A HIPAA compliant phone message was left for the patient providing contact information and requesting a return call.   [place]  Direct Dial: [phone]

## 2022-01-29 NOTE — Progress Notes (Signed)
  Care Coordination Note  01/29/2022 Name: Jonathan [name] MRN: [id number] DOB: 1940/07/06  Jonathan [name] is a 81 y.o. year old male who is a primary care patient of [name], Hershal Coria and is actively engaged with the care management team. I reached out to [name] by phone today to assist with re-scheduling a follow up visit with the RN Case Manager  Follow up plan: Unsuccessful telephone outreach attempt made. A HIPAA compliant phone message was left for the patient providing contact information and requesting a return call.   [place]  Direct Dial: [phone]

## 2022-01-29 NOTE — Progress Notes (Signed)
  Care Coordination Note  01/29/2022 Name: Jonathan [name] MRN: [id number] DOB: 07/03/1940  Jonathan [name] is a 81 y.o. year old male who is a primary care patient of [name], Hershal Coria and is actively engaged with the care management team. I reached out to [name] by phone today to assist with re-scheduling a follow up visit with the RN Case Manager  Follow up plan: Telephone appointment with care management team member scheduled for:02/01/22  [place]  Direct Dial: [phone]

## 2022-01-30 ENCOUNTER — Telehealth: Payer: Self-pay

## 2022-01-30 NOTE — Telephone Encounter (Signed)
Made patient aware to complete his course of bactrim. Patient voiced he need a refill for Flomax. Patient voiced understanding.

## 2022-01-30 NOTE — Telephone Encounter (Signed)
Patient called and advised they needed a refill on medication below.   Medication: tamsulosin (FLOMAX) 0.4 MG CAPS capsule     Pharmacy: [facility]   Thank you

## 2022-01-30 NOTE — Telephone Encounter (Signed)
-----   Message from Irine Seal, MD sent at 01/24/2022  5:15 PM EDT ----- Elizebeth Koller bactrim.  ----- Message ----- From: Sherrilyn Rist, CMA Sent: 01/24/2022   8:46 AM EDT To: Irine Seal, MD  Please Review

## 2022-01-31 ENCOUNTER — Other Ambulatory Visit: Payer: Self-pay

## 2022-01-31 MED ORDER — TAMSULOSIN HCL 0.4 MG PO CAPS: 0.4000 mg | ORAL_CAPSULE | Freq: Every day | ORAL | 1 refills | Status: DC

## 2022-01-31 NOTE — Telephone Encounter (Signed)
Refill sent to pharmacy as requested.  Patient notified.

## 2022-02-01 ENCOUNTER — Encounter: Payer: Self-pay | Admitting: Urology

## 2022-02-01 ENCOUNTER — Ambulatory Visit (INDEPENDENT_AMBULATORY_CARE_PROVIDER_SITE_OTHER): Payer: Medicare Other | Admitting: Urology

## 2022-02-01 ENCOUNTER — Ambulatory Visit: Payer: Self-pay | Admitting: *Deleted

## 2022-02-01 ENCOUNTER — Other Ambulatory Visit: Payer: Self-pay

## 2022-02-01 VITALS — BP 132/80 | HR 72

## 2022-02-01 DIAGNOSIS — Z8546 Personal history of malignant neoplasm of prostate: Secondary | ICD-10-CM

## 2022-02-01 DIAGNOSIS — R350 Frequency of micturition: Secondary | ICD-10-CM

## 2022-02-01 DIAGNOSIS — R351 Nocturia: Secondary | ICD-10-CM

## 2022-02-01 DIAGNOSIS — Z87440 Personal history of urinary (tract) infections: Secondary | ICD-10-CM

## 2022-02-01 DIAGNOSIS — R300 Dysuria: Secondary | ICD-10-CM

## 2022-02-01 DIAGNOSIS — N3941 Urge incontinence: Secondary | ICD-10-CM

## 2022-02-01 DIAGNOSIS — N393 Stress incontinence (female) (male): Secondary | ICD-10-CM

## 2022-02-01 DIAGNOSIS — N390 Urinary tract infection, site not specified: Secondary | ICD-10-CM

## 2022-02-01 DIAGNOSIS — N3946 Mixed incontinence: Secondary | ICD-10-CM | POA: Diagnosis not present

## 2022-02-01 MED ORDER — TAMSULOSIN HCL 0.4 MG PO CAPS: 0.4000 mg | ORAL_CAPSULE | Freq: Every day | ORAL | 3 refills | Status: DC

## 2022-02-01 MED ORDER — DOXYCYCLINE HYCLATE 100 MG PO CAPS: 100.0000 mg | ORAL_CAPSULE | Freq: Two times a day (BID) | ORAL | 0 refills | Status: DC

## 2022-02-01 NOTE — Patient Outreach (Signed)
  Care Coordination   02/01/2022 Name: Jonathan [name] MRN: [id number] DOB: 02-15-1941   Care Coordination Outreach Attempts:  An unsuccessful telephone outreach was attempted for a scheduled appointment today.  Follow Up Plan:  Additional outreach attempts will be made to offer the patient care coordination information and services.   Encounter Outcome:  No Answer  Care Coordination Interventions Activated:  No   Care Coordination Interventions:  No, not indicated    [name], BSN, RN-BC RN Care Coordinator [place]: [phone] Main #: [phone]

## 2022-02-01 NOTE — Progress Notes (Signed)
Subjective:  1. Dysuria   2. Recurrent UTI   3. History of prostate cancer   4. Stress incontinence, male   5. Urge incontinence   6. Nocturia   7. Urinary frequency    02/01/22: Jonathan [name] returns today to assess his response to the bactrim he was given for a Citrobacter UTI on 01/18/22.  He had some initial benefit but the dysuria has recurred.  He took his last dose last night.  His UA is clear today.   He remains on tamsulosin.  01/18/22: Jonathan [name] returns today in f/u for the history below.  His PSA remains <0.1 with a T of 313.  He has chronic incontinence and had been on PTNS but that has been held.  He is having some enuresis.  He is off of  Gemtesa and Trospium after presenting with retention on 12/05/21 but passed a voiding trial on 12/19/21.  His PVR is 20ml today.   He is having some initial dysuria that is chronic.  He was started on Tamsulosin for the retention.  He off of daily tadalafil but is having success on Viagra for ED.  He is on oxycodone for chronic pain.    10/12/21: Jonathan [name] is here today for PTNS.  His PSA remains <0.1.  He completed EXRT with ADT in 9/19 with the last lupron in 1/21.  His testosterone is up to 313.  His UA is clear.  He has incontinence and wears guards.  His IPSS is 25.  He has nocturia x 3.  He is using the [place] and trospium.   He continues to use Viagra for the ED.   He had a PET scan on 7/12 for f/u of his lung cancer.   There was no evidence of recurrent disease.   06/22/21: Jonathan [name] returns today in f/u. He has a history of T3b N0 M0 Gleason 9 prostate cancer that was treated with EXRT and ADT.   He completed EXRT in 9/19.   His final  Lupron injection was given 1/21. His PSA was up to 0.6 in November from an undetectible level.  He has no bone pain or weight loss.  His testosterone level was 310 which was up from 286 in May.   He had a lumbar laminectomy in January and he is recovering from that with improvement in his symptoms.   He continues to have OAB wet  and is using depends about 4 daily.   He has been on the PTNS maintenance therapy.  He is out of Gemtesa and his symptoms have worsened with increased nocturia.   He has ED and is using Viagra.  He is not using the injections.  He has had no hematuria or dysuria.   His IPSS is 24.    02/09/21: Jonathan [name] returns today in f/u.  His PSA was <0.1 on 07/28/20.  He didn't get labs prior to the visit.  He has OAB wet and is has been on PTNS maintenance and Gemtesa samples..  He has frequent small voids.  He has nocturia x 3. UA is clear today.   He isn't having accidents. He has some dysuria but no hematuria.   He is getting PTNS today.   He has tadalafil and viagra that he uses prn.  He was given prostin and started using it at home and has had success, but he has been given the med by the [place] and he says the needles are too big.  He does have success with the viagra.  IPSS     Row Name 02/01/22 1000         International Prostate Symptom Score   How often have you had the sensation of not emptying your bladder? Less than half the time     How often have you had to urinate less than every two hours? About half the time     How often have you found you stopped and started again several times when you urinated? About half the time     How often have you found it difficult to postpone urination? Not at All     How often have you had a weak urinary stream? Less than half the time     How often have you had to strain to start urination? Less than 1 in 5 times     How many times did you typically get up at night to urinate? 3 Times     Total IPSS Score 14       Quality of Life due to urinary symptoms   If you were to spend the rest of your life with your urinary condition just the way it is now how would you feel about that? Mostly Disatisfied                    ROS:  ROS:  A complete review of systems was performed.  All systems are negative except for pertinent findings as noted.    Review of Systems  Constitutional:  Positive for malaise/fatigue.  Eyes:  Positive for blurred vision.  Respiratory:  Positive for shortness of breath.   Cardiovascular:  Positive for leg swelling.  Gastrointestinal:  Positive for constipation.  Musculoskeletal:  Positive for back pain and joint pain.  Skin:  Positive for itching and rash.  Neurological:  Positive for dizziness.  Psychiatric/Behavioral:  Positive for memory loss.     Allergies  Allergen Reactions   Ampicillin Other (See Comments)    Don't work   Ciprofloxacin Other (See Comments)    Doesn't work   Levaquin [Levofloxacin] Other (See Comments)    Doesn't work    Outpatient Encounter Medications as of 02/01/2022  Medication Sig   albuterol (PROVENTIL) (2.5 MG/3ML) 0.083% nebulizer solution Take 3 mLs (2.5 mg total) by nebulization every 6 (six) hours as needed for shortness of breath.   atenolol-chlorthalidone (TENORETIC) 50-25 MG tablet Take 1 tablet by mouth at bedtime.    atorvastatin (LIPITOR) 20 MG tablet Take 20 mg by mouth daily.   capsaicin (ZOSTRIX) 0.025 % cream Apply 1 application topically daily as needed (Skin Rash).   diclofenac Sodium (VOLTAREN) 1 % GEL Apply 2 g topically 4 (four) times daily as needed (pain).   donepezil (ARICEPT) 5 MG tablet Take 5 mg by mouth daily.   doxycycline (VIBRAMYCIN) 100 MG capsule Take 1 capsule (100 mg total) by mouth every 12 (twelve) hours.   escitalopram (LEXAPRO) 5 MG tablet Take 5 mg by mouth daily.   FREESTYLE LITE test strip    gabapentin (NEURONTIN) 800 MG tablet Take 800 mg by mouth daily.   hydroquinone 4 % cream Apply 1 application topically daily as needed (Skin Rash).   ibuprofen (ADVIL) 600 MG tablet Take 600 mg by mouth every 6 (six) hours as needed for headache.   Incontinence Supply Disposable (FITTED BRIEFS MAXIMUM XL) MISC Use briefs as needed daily for incontinence   Lancets (FREESTYLE) lancets    LANTUS SOLOSTAR 100 UNIT/ML Solostar Pen Inject  2.5 Units into  the skin at bedtime.   latanoprost (XALATAN) 0.005 % ophthalmic solution Place 1 drop into both eyes at bedtime.   lidocaine (LIDODERM) 5 % Place 1 patch onto the skin daily. Remove & Discard patch within 12 hours or as directed by MD   LINZESS 72 MCG capsule Take 72 mcg by mouth daily before breakfast.   meclizine (ANTIVERT) 12.5 MG tablet Take 6.25-12.5 mg by mouth every 6 (six) hours as needed for dizziness.   meloxicam (MOBIC) 7.5 MG tablet Take 1 tablet (7.5 mg total) by mouth daily.   methocarbamol (ROBAXIN) 500 MG tablet Take 1 tablet (500 mg total) by mouth 2 (two) times daily as needed for muscle spasms.   metoCLOPramide (REGLAN) 5 MG tablet Take 5 mg by mouth daily as needed for vomiting or nausea.   mupirocin cream (BACTROBAN) 2 % Apply 1 application topically 2 (two) times daily as needed (wound care).   mupirocin ointment (BACTROBAN) 2 % Apply 1 application topically 2 (two) times daily as needed (Rash).   nystatin ointment (MYCOSTATIN) 1 application daily as needed (Skin Rash).   nystatin-triamcinolone ointment (MYCOLOG) Apply 1 application topically 2 (two) times daily. To groins   Oxycodone HCl 20 MG TABS Take 4 mg by mouth See admin instructions. Five times a day as needed   pantoprazole (PROTONIX) 20 MG tablet Take 20 mg by mouth daily as needed for heartburn or indigestion.   predniSONE (DELTASONE) 20 MG tablet Take 20 mg by mouth daily as needed (unknown).   psyllium (METAMUCIL SMOOTH TEXTURE) 58.6 % powder Take 1 packet by mouth daily.   RHOPRESSA 0.02 % SOLN Place 1 drop into the right eye at bedtime.   ROCKLATAN 0.02-0.005 % SOLN Place 1 drop into both eyes at bedtime.   SPIRIVA HANDIHALER 18 MCG inhalation capsule 18 mcg 2 (two) times daily.   terbinafine (LAMISIL) 250 MG tablet Take 250 mg by mouth 2 (two) times daily.   timolol (TIMOPTIC) 0.5 % ophthalmic solution Place 1 drop into both eyes 2 (two) times daily as needed (eye pressure).   VIAGRA 100 MG  tablet Take 1 tablet (100 mg total) by mouth daily as needed for erectile dysfunction.   [DISCONTINUED] sulfamethoxazole-trimethoprim (BACTRIM DS) 800-160 MG tablet Take 1 tablet by mouth every 12 (twelve) hours.   [DISCONTINUED] tamsulosin (FLOMAX) 0.4 MG CAPS capsule Take 1 capsule (0.4 mg total) by mouth daily.   furosemide (LASIX) 20 MG tablet Take 1 tablet (20 mg total) by mouth daily for 7 days.   tamsulosin (FLOMAX) 0.4 MG CAPS capsule Take 1 capsule (0.4 mg total) by mouth daily.   No facility-administered encounter medications on file as of 02/01/2022.    Past Medical History:  Diagnosis Date   Acquired bladder diverticulum    12/ 2012  resection diverticulum done at [facility] in [place], [place]   Age-related cataract of right eye    scheduled for catarat extraction 09-12-2017   Agent orange exposure    Aneurysm of infrarenal abdominal aorta ([place])    first dx 2017--- last CT 06-11-2017  measures 3.5cm   Anxiety    Chronic constipation    COPD with emphysema ([place])    06--12-2017  per pt no symptoms since Feb 2019   Depression    PTSD   Diabetes mellitus type 2, diet-controlled ([place])    Diabetic neuropathy ([place])    Dyslipidemia    Dyspnea on exertion    Erectile dysfunction    Feeling of incomplete bladder emptying  Gait abnormality 01/06/2020   GERD (gastroesophageal reflux disease)    Gout    09-02-2017 last flare up 3 months ago   Hiatal hernia    History of atrial fibrillation    episode post op right lung lobectomy 07-04-2015   History of bladder stone    History of colon polyps    History of DVT of lower extremity    03/ 2019  bilateral lower extremity (superficial) ---  per treated w/ oral medication    History of gastritis    History of kidney stones    History of radiation therapy 09-14-2015  to 09-21-2015   left upper lung nodule -- 54Gy in 3 fractions (18 Gy per fraction)   Hyperplasia of prostate with lower urinary tract symptoms (LUTS)    Hypertension     Lumbar spinal stenosis    Nephrolithiasis    Neurogenic bladder    Osteoarthritis    ankle, hands   Osteoporosis    Prostate cancer [facility]) urologist-  dr Jameica Couts/  oncologist-  dr Tammi Klippel   dx 05-24-2017-- Stage T2b,  Gleason 4+4,  PSA 22.41,  vol 38cc--- started ADT 04/ 2019,  plan external radiation therapy   Radiation fibrosis of lung ([place])    hx left upper long nodule SBRT 06/ 2017   Squamous cell carcinoma of both lungs ([place]) last CT in epic dated 06-26-2017 no recurrence   dx 03/ 2017  non-small cell SCC via bronchoscopy w/ bx's by dr Roxan Hockey---  s/p  right VATS w/ right lobectomy and node dissection's 07-04-2015 /  pt had SBRT to left upper nodule Stage 1 ([facility]) completed 09-21-2015   Vertigo 2018   Wears dentures    bottom   Wears glasses     Past Surgical History:  Procedure Laterality Date   BIOPSY  05/29/2019   Procedure: BIOPSY;  Surgeon: Rogene Houston, MD;  [facility];  Service: Endoscopy;;  gastric ulcer   CARPAL TUNNEL RELEASE Right 07/09/2014   Procedure: RIGHT OPEN CARPAL TUNNEL RELEASE;  Surgeon: Mcarthur Rossetti, MD;  [facility];  Service: Orthopedics;  Laterality: Right;   CHOLECYSTECTOMY N/A 02/24/2014   Procedure: LAPAROSCOPIC CHOLECYSTECTOMY;  Surgeon: Coralie Keens, MD;  Location: [place];  Service: General;  Laterality: N/A;   COLONOSCOPY     CYSTOLITHOTOMY  11/ 2007      [facility] [place] [facility]/18/2018   Procedure: CYSTOSCOPY FLEXIBLE;  Surgeon: Irine Seal, MD;  [facility];  Service: Urology;  Laterality: N/A;   CYSTOSCOPY W/ LITHOLAPAXY / EHL  02-24-2007   dr Janice Norrie  [facility]   dental implant     No teeth at this time waiting for them to be made as of 01-30-19   ESOPHAGOGASTRODUODENOSCOPY (EGD) WITH PROPOFOL N/A 05/29/2019   Procedure: ESOPHAGOGASTRODUODENOSCOPY (EGD) WITH PROPOFOL;  Surgeon: Rogene Houston, MD;  [facility];  Service: Endoscopy;  Laterality: N/A;  [place] IMPLANT N/A 09/05/2017   Procedure: GOLD SEED IMPLANT;  Surgeon: Irine Seal, MD;  Location: [facility];  Service: Urology;  Laterality: N/A;   INSERTION OF SUPRAPUBIC CATHETER N/A 04/18/2018   Procedure: SUPRAPUBIC TUBE CHANGE;  Surgeon: Irine Seal, MD;  [facility];  Service: Urology;  Laterality: N/A;   IR CATHETER TUBE CHANGE  02/24/2018   LUMBAR LAMINECTOMY/DECOMPRESSION MICRODISCECTOMY Right 03/31/2021   Procedure: Bilateral Lumbar Three- Four, Lumbar Four-Five Laminectomy, Decompression;  Surgeon: Ashok Pall, MD;  Location: [place] [facility];  Service: Neurosurgery;  Laterality: Right;   SPACE OAR INSTILLATION N/A 09/05/2017   Procedure: SPACE OAR INSTILLATION;  Surgeon: Irine Seal, MD;  Location: [facility];  Service: Urology;  Laterality: N/A;   TOTAL ANKLE ARTHROPLASTY Right 04/20/2015   Procedure: TOTAL ANKLE ARTHOPLASTY;  Surgeon: Newt Minion, MD;  Location: [place];  Service: Orthopedics;  Laterality: Right;   TRANSTHORACIC ECHOCARDIOGRAM  11/16/2012   ef 55-60%,  grade 1 diastolic dysfunction/  mild dilated ascending aorta/  mild MR/ trivial TR   TRANSURETHRAL RESECTION OF PROSTATE N/A 02/03/2019   Procedure: TRANSURETHRAL RESECTION OF THE PROSTATE (TURP);  Surgeon: Irine Seal, MD;  [facility];  Service: Urology;  Laterality: N/A;   VIDEO ASSISTED THORACOSCOPY (VATS)/ LOBECTOMY Right 07/04/2015   Procedure: VIDEO ASSISTED THORACOSCOPY (VATS)/ RIGHT UPPER LOBECTOMY;  Surgeon: Melrose Nakayama, MD;  Location: [place];  Service: Thoracic;  Laterality: Right;   VIDEO BRONCHOSCOPY N/A 06/17/2015   Procedure: VIDEO BRONCHOSCOPY;  Surgeon: Melrose Nakayama, MD;  Location: [place];  Service: Thoracic;  Laterality: N/A;   VIDEO BRONCHOSCOPY WITH ENDOBRONCHIAL NAVIGATION N/A 06/17/2015   Procedure: VIDEO BRONCHOSCOPY WITH ENDOBRONCHIAL NAVIGATION;  Surgeon: Melrose Nakayama, MD;  Location: [place];  Service: Thoracic;  Laterality: N/A;   VIDEO  BRONCHOSCOPY WITH ENDOBRONCHIAL ULTRASOUND N/A 06/17/2015   Procedure: VIDEO BRONCHOSCOPY WITH ENDOBRONCHIAL ULTRASOUND;  Surgeon: Melrose Nakayama, MD;  [facility];  Service: Thoracic;  Laterality: N/A;    Social History   Socioeconomic History   Marital status: Married    Spouse name: Jonathan [name]   Number of children: 2   Years of education: college   Highest education level: Not on file  Occupational History   Occupation: retired  Tobacco Use   Smoking status: Former    Packs/day: 0.50    Years: 40.00    Total pack years: 20.00    Types: Cigarettes    Quit date: 02/13/2015    Years since quitting: 6.9   Smokeless tobacco: Never  Vaping Use   Vaping Use: Never used  Substance and Sexual Activity   Alcohol use: No    Alcohol/week: 0.0 standard drinks of alcohol   Drug use: No   Sexual activity: Not Currently  Other Topics Concern   Not on file  Social History Narrative   Lives with wife, daughter and son in law temporarily to help them out   Right Handed   Drinks caffeine occassionally   Social Determinants of Health   Financial Resource Strain: Low Risk  (11/16/2021)   Overall Financial Resource Strain (CARDIA)    Difficulty of Paying Living Expenses: Not hard at all  Food Insecurity: No Food Insecurity (11/16/2021)   Hunger Vital Sign    Worried About Running Out of Food in the Last Year: Never true    Gantt in the Last Year: Never true  Transportation Needs: No Transportation Needs (11/16/2021)   PRAPARE - Hydrologist (Medical): No    Lack of Transportation (Non-Medical): No  Physical Activity: Not on file  Stress: Not on file  Social Connections: Not on file  Intimate Partner Violence: Not on file    Family History  Problem Relation Age of Onset   Cancer Father        Asbestos   Colon cancer Neg Hx    Colon polyps Neg Hx    Kidney disease Neg Hx    Esophageal cancer  Neg Hx    Gallbladder disease Neg Hx     Heart disease Neg Hx    Diabetes Neg Hx        Objective: Vitals:   02/01/22 1031  BP: 132/80  Pulse: 72      Physical Exam Vitals reviewed.  Constitutional:      Appearance: Normal appearance.  Neurological:     Mental Status: He is alert.     Lab Results:  No results found for this or any previous visit (from the past 24 hour(s)).  UA is clear.    BMET No results for input(s): "NA", "K", "CL", "CO2", "GLUCOSE", "BUN", "CREATININE", "CALCIUM" in the last 72 hours. PSA Lab Results  Component Value Date   PSA1 <0.1 01/12/2022   PSA1 <0.1 09/07/2021   PSA1 0.6 02/09/2021    PSA  Date Value Ref Range Status  08/21/2019 <0.1 < OR = 4.0 ng/mL Final    Comment:    The total PSA value from this assay system is  standardized against the WHO standard. The test  result will be approximately 20% lower when compared  to the equimolar-standardized total PSA (Beckman  Coulter). Comparison of serial PSA results should be  interpreted with this fact in mind. . This test was performed using the Siemens  chemiluminescent method. Values obtained from  different assay methods cannot be used interchangeably. PSA levels, regardless of value, should not be interpreted as absolute evidence of the presence or absence of disease.   04/06/2019 <0.1 < OR = 4.0 ng/mL Final    Comment:    The total PSA value from this assay system is  standardized against the WHO standard. The test  result will be approximately 20% lower when compared  to the equimolar-standardized total PSA (Beckman  Coulter). Comparison of serial PSA results should be  interpreted with this fact in mind. . This test was performed using the Siemens  chemiluminescent method. Values obtained from  different assay methods cannot be used interchangeably. PSA levels, regardless of value, should not be interpreted as absolute evidence of the presence or absence of disease.   10/26/2008 0.69 0.10 - 4.00 ng/mL  Final    Comment:    See lab report for associated comment(s)   Testosterone  Date Value Ref Range Status  01/12/2022 313 264 - 916 ng/dL Final    Comment:    Adult male reference interval is based on a population of healthy nonobese males (BMI <30) between 51 and 57 years old. [place], [place] [phone]. PMID: 02409735.   09/07/2021 313 264 - 916 ng/dL Final    Comment:    Adult male reference interval is based on a population of healthy nonobese males (BMI <30) between 64 and 42 years old. [place], [place] [phone]. PMID: 22979892.   02/09/2021 310 264 - 916 ng/dL Final    Comment:    Adult male reference interval is based on a population of healthy nonobese males (BMI <30) between 16 and 65 years old. [place], [place] [phone]. PMID: [id number].    UA looks infected today. .    Studies/Results:   Assessment & Plan: Prostate cancer.   His  PSA remains undetectible.  His T is normal.   OAB with incontinence with an elevated PVR.  He will resume the  PTNS.  Tamsulosin refilled.   Recurrent UTI's.  UA is clear today.  He would like to have something on hand for breakthroughs so I sent doxycycline.   ED.  He will continue the Viagra.    Meds ordered this encounter  Medications   tamsulosin (FLOMAX) 0.4 MG CAPS capsule    Sig: Take 1 capsule (0.4 mg total) by mouth daily.    Dispense:  90 capsule    Refill:  3   doxycycline (VIBRAMYCIN) 100 MG capsule    Sig: Take 1 capsule (100 mg total) by mouth every 12 (twelve) hours.    Dispense:  14 capsule    Refill:  0     Orders Placed This Encounter  Procedures   Urinalysis, Routine w reflex microscopic        Return in about 6 months (around 08/02/2022) for He should have 6 mo f/u scheduled already in April..   CCLanelle Bal, PA-C      Irine Seal 02/01/2022 Patient ID: [name], male   DOB: 01-28-1941, 81 y.o.   MRN: [id number] Patient ID: ULIS KAPS, male   DOB: 1940/08/03, 81 y.o.   MRN: [id number] Patient ID: BOSS DANIELSEN, male   DOB: 11/24/1940, 81 y.o.   MRN: [id number]

## 2022-02-01 NOTE — Progress Notes (Signed)
PTNS  Session # 31 of 45  Health & Social Factors: fluid pills Caffeine: 1 Alcohol: none Daytime voids #per day: 8 Night-time voids #per night: 4 Urgency: yes Incontinence Episodes #per day: 3 Ankle used: left Treatment Setting: 10 Feeling/ Response: heel, running up leg Comments: n/a  Performed By: [facility] LPN  Follow Up: keep scheduled [place]

## 2022-02-02 LAB — URINALYSIS, ROUTINE W REFLEX MICROSCOPIC
Bilirubin, UA: NEGATIVE
Glucose, UA: NEGATIVE
Ketones, UA: NEGATIVE
Leukocytes,UA: NEGATIVE
Nitrite, UA: NEGATIVE
Protein,UA: NEGATIVE
RBC, UA: NEGATIVE
Specific Gravity, UA: 1.025 (ref 1.005–1.030)
Urobilinogen, Ur: 0.2 mg/dL (ref 0.2–1.0)
pH, UA: 5.5 (ref 5.0–7.5)

## 2022-02-06 ENCOUNTER — Ambulatory Visit: Payer: TRICARE For Life (TFL)

## 2022-02-21 ENCOUNTER — Ambulatory Visit: Payer: TRICARE For Life (TFL) | Admitting: *Deleted

## 2022-02-21 ENCOUNTER — Telehealth: Payer: Self-pay | Admitting: *Deleted

## 2022-02-21 ENCOUNTER — Encounter: Payer: Self-pay | Admitting: *Deleted

## 2022-02-21 NOTE — Patient Outreach (Signed)
Care Coordination   Follow Up Visit Note   02/21/2022 Name: Jonathan [name] MRN: [id number] DOB: 11/19/1940  Jonathan [name] is a 81 y.o. year old male who sees Jonathan [name], [place] for primary care. I spoke with  Jonathan [name] by phone today.  What matters to the patients health and wellness today?  Managing shortness of breath and increasing physical fitness    Goals Addressed               This Visit's Progress     Patient Stated     COMPLETED: Health Maintenance (pt-stated)        Care Coordination Interventions: Evaluation of current treatment plan related to maintaining health and patient's adherence to plan as established by provider Advised patient to have wife call this RNCM Reviewed medications with patient and discussed importance of adherence Reviewed scheduled/upcoming provider appointments including need for AWV Assessed social determinant of health barriers  8/25 - Wife encouraged to schedule appointment with pulmonologist for complaints of shortness of breath, history of COPD and lung cancer requiring surgery to remove.       Other     Care Coordination        Care Coordination Interventions: Evaluation of current treatment plan related to DM & shortness of breath and patient's adherence to plan as established by provider Advised patient to consider water aerobics at [facility] to improve on physical deconditioning and improve lung/cardiac function without extra stress on joints Provided education to patient re: diabetic and heart healthy diet Reviewed medications with patient and discussed appropriate use of Spiriva as a daily maintenance inhaler and not PRN and use of albuterol neb or inhaler PRN for flare-ups Collaborated with PCP, Jonathan Bal, PA-C regarding cardiac referral for evaluation Discussed plans with patient for ongoing care management follow up and provided patient with direct contact information for care management team Assessed social  determinant of health barriers Reviewed and discussed recent imaging reports and last visit with pulmonologist Dr Melvyn Novas Based on reports and information in office note, I agree that his shortness of breath with exertion is likely due to deconditioning secondary to decreased activity and use of motorized scooter for ambulation due to peripheral neuropathy and ankle fusion. Cardiology referral may be appropriate just to r/o any cardiac cause for SOB since pulmonologist cleared him of any complicated pulmonary conditions. He has had partial lung removal due to lung cancer, so SOB may be expected given decreased lung capacity. Discussed appropriate expectations given age, physical activity level, and history of lung surgery Discussed family/social support. Has help from wife and is very social. Discussed 100% service related VA disability. He accesses all available resources from this.  Discussed mobility and ability to perform ADLs Has a walker and motorized scooter. Uses scooter when out of the home.  Has a pedal bike through the [place] that he uses one hour each day to improve leg strength and blood flow Provided with [facility] telephone number and encouraged to reach out as needed Scheduled f/u with [facility] for 03/23/22         SDOH assessments and interventions completed:  Yes  SDOH Interventions Today    Flowsheet Row Most Recent Value  SDOH Interventions   Housing Interventions Intervention Not Indicated  Transportation Interventions Intervention Not Indicated  Financial Strain Interventions Intervention Not Indicated        Care Coordination Interventions:  Yes, provided   Follow up plan: Follow up call scheduled for 03/23/22  Encounter Outcome:  Pt. Visit Completed   [name], BSN, RN-BC RN Care Coordinator [place] Direct Dial: [phone] Main #: [phone]

## 2022-02-21 NOTE — Progress Notes (Signed)
  Care Coordination Note  02/21/2022 Name: Jonathan [name] MRN: [id number] DOB: Apr 20, 1940  Jonathan [name] is a 81 y.o. year old male who is a primary care patient of [name], Hershal Coria and is actively engaged with the care management team. I reached out to [name] by phone today to assist with re-scheduling a follow up visit with the RN Case Manager  Follow up plan: Telephone appointment with care management team member scheduled for:02/21/22  [place]  Direct Dial: [phone]

## 2022-02-21 NOTE — Progress Notes (Signed)
  Care Coordination Note  02/21/2022 Name: Jonathan [name] MRN: [id number] DOB: April 16, 1940  Jonathan [name] is a 81 y.o. year old male who is a primary care patient of [name], Hershal Coria and is actively engaged with the care management team. I reached out to [name] by phone today to assist with re-scheduling a follow up visit with the RN Case Manager  Follow up plan: Unsuccessful telephone outreach attempt made. A HIPAA compliant phone message was left for the patient providing contact information and requesting a return call.   [place]  Direct Dial: [phone]

## 2022-02-27 DIAGNOSIS — R300 Dysuria: Secondary | ICD-10-CM | POA: Diagnosis not present

## 2022-02-27 DIAGNOSIS — Z6826 Body mass index (BMI) 26.0-26.9, adult: Secondary | ICD-10-CM | POA: Diagnosis not present

## 2022-02-27 DIAGNOSIS — K5901 Slow transit constipation: Secondary | ICD-10-CM | POA: Diagnosis not present

## 2022-02-27 DIAGNOSIS — Z85118 Personal history of other malignant neoplasm of bronchus and lung: Secondary | ICD-10-CM | POA: Diagnosis not present

## 2022-02-27 DIAGNOSIS — M6281 Muscle weakness (generalized): Secondary | ICD-10-CM | POA: Diagnosis not present

## 2022-02-27 DIAGNOSIS — M792 Neuralgia and neuritis, unspecified: Secondary | ICD-10-CM | POA: Diagnosis not present

## 2022-02-27 DIAGNOSIS — E0865 Diabetes mellitus due to underlying condition with hyperglycemia: Secondary | ICD-10-CM | POA: Diagnosis not present

## 2022-02-27 DIAGNOSIS — R2689 Other abnormalities of gait and mobility: Secondary | ICD-10-CM | POA: Diagnosis not present

## 2022-02-27 DIAGNOSIS — I10 Essential (primary) hypertension: Secondary | ICD-10-CM | POA: Diagnosis not present

## 2022-02-27 DIAGNOSIS — R42 Dizziness and giddiness: Secondary | ICD-10-CM | POA: Diagnosis not present

## 2022-02-27 DIAGNOSIS — R531 Weakness: Secondary | ICD-10-CM | POA: Diagnosis not present

## 2022-02-27 DIAGNOSIS — M545 Low back pain, unspecified: Secondary | ICD-10-CM | POA: Diagnosis not present

## 2022-02-27 DIAGNOSIS — J701 Chronic and other pulmonary manifestations due to radiation: Secondary | ICD-10-CM | POA: Diagnosis not present

## 2022-02-27 DIAGNOSIS — R4189 Other symptoms and signs involving cognitive functions and awareness: Secondary | ICD-10-CM | POA: Diagnosis not present

## 2022-03-07 DIAGNOSIS — Z961 Presence of intraocular lens: Secondary | ICD-10-CM | POA: Diagnosis not present

## 2022-03-07 DIAGNOSIS — H401133 Primary open-angle glaucoma, bilateral, severe stage: Secondary | ICD-10-CM | POA: Diagnosis not present

## 2022-03-09 ENCOUNTER — Ambulatory Visit (INDEPENDENT_AMBULATORY_CARE_PROVIDER_SITE_OTHER): Payer: Medicare Other | Admitting: Urology

## 2022-03-09 DIAGNOSIS — R300 Dysuria: Secondary | ICD-10-CM | POA: Diagnosis not present

## 2022-03-09 LAB — MICROSCOPIC EXAMINATION: Bacteria, UA: NONE SEEN

## 2022-03-09 LAB — URINALYSIS, ROUTINE W REFLEX MICROSCOPIC
Bilirubin, UA: NEGATIVE
Glucose, UA: NEGATIVE
Ketones, UA: NEGATIVE
Nitrite, UA: NEGATIVE
Specific Gravity, UA: 1.025 (ref 1.005–1.030)
Urobilinogen, Ur: 0.2 mg/dL (ref 0.2–1.0)
pH, UA: 5.5 (ref 5.0–7.5)

## 2022-03-09 NOTE — Progress Notes (Addendum)
PTNS  Session # 32 of45  Health & Social Factors: no Caffeine: 1 Alcohol: no Daytime voids #per day: 10 Night-time voids #per night: 4 Urgency: 3 Incontinence Episodes #per day: 3 Ankle used: right Treatment Setting: 2 Feeling/ Response: positive sensation  Comments: NA  Performed By: TMMITVIF CMA / H. Cobb LPN (assisted)    Follow Up: keep scheduled NV

## 2022-03-12 DIAGNOSIS — R946 Abnormal results of thyroid function studies: Secondary | ICD-10-CM | POA: Diagnosis not present

## 2022-03-12 DIAGNOSIS — Z85118 Personal history of other malignant neoplasm of bronchus and lung: Secondary | ICD-10-CM | POA: Diagnosis not present

## 2022-03-12 DIAGNOSIS — E1165 Type 2 diabetes mellitus with hyperglycemia: Secondary | ICD-10-CM | POA: Diagnosis not present

## 2022-03-12 DIAGNOSIS — Z0001 Encounter for general adult medical examination with abnormal findings: Secondary | ICD-10-CM | POA: Diagnosis not present

## 2022-03-12 DIAGNOSIS — Z13220 Encounter for screening for lipoid disorders: Secondary | ICD-10-CM | POA: Diagnosis not present

## 2022-03-12 DIAGNOSIS — E1143 Type 2 diabetes mellitus with diabetic autonomic (poly)neuropathy: Secondary | ICD-10-CM | POA: Diagnosis not present

## 2022-03-23 ENCOUNTER — Ambulatory Visit: Payer: Self-pay | Admitting: *Deleted

## 2022-03-23 NOTE — Patient Outreach (Signed)
  Care Coordination   03/23/2022 Name: Jonathan [name] MRN: [id number] DOB: 1941-02-05   Care Coordination Outreach Attempts:  An unsuccessful telephone outreach was attempted for a scheduled appointment today.  Follow Up Plan:  Additional outreach attempts will be made to offer the patient care coordination information and services.   Encounter Outcome:  No Answer   Care Coordination Interventions:  No, not indicated    [name], BSN, RN-BC RN Care Coordinator [place]: [phone] Main #: [phone]

## 2022-03-30 ENCOUNTER — Telehealth: Payer: Self-pay | Admitting: *Deleted

## 2022-03-30 NOTE — Progress Notes (Signed)
  Care Coordination Note  03/30/2022 Name: Jonathan [name] MRN: [id number] DOB: 05-07-1940  Jonathan [name] is a 82 y.o. year old male who is a primary care patient of [name], Hershal Coria and is actively engaged with the care management team. I reached out to [name] by phone today to assist with re-scheduling a follow up visit with the RN Case Manager  Follow up plan: Telephone appointment with care management team member scheduled for:04/05/22  [place]: [phone]

## 2022-03-30 NOTE — Progress Notes (Signed)
  Care Coordination Note  03/30/2022 Name: Jonathan [name] MRN: [id number] DOB: 1941/03/06  Jonathan [name] is a 82 y.o. year old male who is a primary care patient of [name], Hershal Coria and is actively engaged with the care management team. I reached out to [name] by phone today to assist with re-scheduling a follow up visit with the RN Case Manager  Follow up plan: A second Unsuccessful telephone outreach attempt made. A HIPAA compliant phone message was left for the patient providing contact information and requesting a return call.   [name]  Direct Dial: [phone]

## 2022-04-02 ENCOUNTER — Ambulatory Visit: Payer: Medicare Other | Attending: Cardiology | Admitting: Cardiology

## 2022-04-02 ENCOUNTER — Encounter: Payer: Self-pay | Admitting: Cardiology

## 2022-04-02 VITALS — BP 124/80 | HR 94 | Ht >= 80 in | Wt 240.2 lb

## 2022-04-02 DIAGNOSIS — R0602 Shortness of breath: Secondary | ICD-10-CM | POA: Diagnosis not present

## 2022-04-02 DIAGNOSIS — R0789 Other chest pain: Secondary | ICD-10-CM | POA: Diagnosis not present

## 2022-04-02 NOTE — Progress Notes (Signed)
Clinical Summary Jonathan [name] is a 82 y.o.male seen today as a new patient. Last seen by our office 06/2021, this is a new patient visit for the following medical problems.   1.SOB/DOE - from notes previously evaluated by pulmonary - history COPD GOLD 2 s/p RULobectomy/ RT pneumonitis    -often worst with fumes, cigarette smoke - uses motorized scooter typically, chronic leg weakness and severe neuropathy.  - sedentary lifetyle.  - DOE with walking short distances, which has progressed - some LE edema at times - occasional chest tightness. Left sided, 5/10 in severity. Tends to occur with activity. No other associated symptoms. Better with position. Lasts about 15 minutes. Occurs 3-4 times a week  CAD risk factors: HTN, HL, DM2, former smoker     2.History of lung cancer - COPD GOLD 2 s/p RULobectomy/ RT pneumonitis        Past Medical History:  Diagnosis Date   Acquired bladder diverticulum    12/ 2012  resection diverticulum done at [facility] in [place], [place]   Age-related cataract of right eye    scheduled for catarat extraction 09-12-2017   Agent orange exposure    Aneurysm of infrarenal abdominal aorta ([place])    first dx 2017--- last CT 06-11-2017  measures 3.5cm   Anxiety    Chronic constipation    COPD with emphysema ([place])    06--12-2017  per pt no symptoms since Feb 2019   Depression    PTSD   Diabetes mellitus type 2, diet-controlled ([place])    Diabetic neuropathy ([place])    Dyslipidemia    Dyspnea on exertion    Erectile dysfunction    Feeling of incomplete bladder emptying    Gait abnormality 01/06/2020   GERD (gastroesophageal reflux disease)    Gout    09-02-2017 last flare up 3 months ago   Hiatal hernia    History of atrial fibrillation    episode post op right lung lobectomy 07-04-2015   History of bladder stone    History of colon polyps    History of DVT of lower extremity    03/ 2019  bilateral lower extremity (superficial) ---  per treated  w/ oral medication    History of gastritis    History of kidney stones    History of radiation therapy 09-14-2015  to 09-21-2015   left upper lung nodule -- 54Gy in 3 fractions (18 Gy per fraction)   Hyperplasia of prostate with lower urinary tract symptoms (LUTS)    Hypertension    Lumbar spinal stenosis    Nephrolithiasis    Neurogenic bladder    Osteoarthritis    ankle, hands   Osteoporosis    Prostate cancer [facility]) urologist-  dr wrenn/  oncologist-  dr Tammi Klippel   dx 05-24-2017-- Stage T2b,  Gleason 4+4,  PSA 22.41,  vol 38cc--- started ADT 04/ 2019,  plan external radiation therapy   Radiation fibrosis of lung ([place])    hx left upper long nodule SBRT 06/ 2017   Squamous cell carcinoma of both lungs ([place]) last CT in epic dated 06-26-2017 no recurrence   dx 03/ 2017  non-small cell SCC via bronchoscopy w/ bx's by dr Roxan Hockey---  s/p  right VATS w/ right lobectomy and node dissection's 07-04-2015 /  pt had SBRT to left upper nodule Stage 1 ([facility]) completed 09-21-2015   Vertigo 2018   Wears dentures    bottom   Wears glasses  Allergies  Allergen Reactions   Ampicillin Other (See Comments)    Don't work   Ciprofloxacin Other (See Comments)    Doesn't work   Levaquin [Levofloxacin] Other (See Comments)    Doesn't work     Current Outpatient Medications  Medication Sig Dispense Refill   albuterol (PROVENTIL) (2.5 MG/3ML) 0.083% nebulizer solution Take 3 mLs (2.5 mg total) by nebulization every 6 (six) hours as needed for shortness of breath. 75 mL 1   atenolol-chlorthalidone (TENORETIC) 50-25 MG tablet Take 1 tablet by mouth at bedtime.      atorvastatin (LIPITOR) 20 MG tablet Take 20 mg by mouth daily.     capsaicin (ZOSTRIX) 0.025 % cream Apply 1 application topically daily as needed (Skin Rash).     diclofenac Sodium (VOLTAREN) 1 % GEL Apply 2 g topically 4 (four) times daily as needed (pain).     donepezil (ARICEPT) 5 MG tablet Take 5 mg by mouth daily.      doxycycline (VIBRAMYCIN) 100 MG capsule Take 1 capsule (100 mg total) by mouth every 12 (twelve) hours. 14 capsule 0   escitalopram (LEXAPRO) 5 MG tablet Take 5 mg by mouth daily.     FREESTYLE LITE test strip      furosemide (LASIX) 20 MG tablet Take 1 tablet (20 mg total) by mouth daily for 7 days. 7 tablet 0   gabapentin (NEURONTIN) 800 MG tablet Take 800 mg by mouth daily.     hydroquinone 4 % cream Apply 1 application topically daily as needed (Skin Rash).     ibuprofen (ADVIL) 600 MG tablet Take 600 mg by mouth every 6 (six) hours as needed for headache.     Incontinence Supply Disposable (FITTED BRIEFS MAXIMUM XL) MISC Use briefs as needed daily for incontinence 1 each 11   Lancets (FREESTYLE) lancets      LANTUS SOLOSTAR 100 UNIT/ML Solostar Pen Inject 2.5 Units into the skin at bedtime.     latanoprost (XALATAN) 0.005 % ophthalmic solution Place 1 drop into both eyes at bedtime.     lidocaine (LIDODERM) 5 % Place 1 patch onto the skin daily. Remove & Discard patch within 12 hours or as directed by MD 30 patch 0   LINZESS 72 MCG capsule Take 72 mcg by mouth daily before breakfast.     meclizine (ANTIVERT) 12.5 MG tablet Take 6.25-12.5 mg by mouth every 6 (six) hours as needed for dizziness.     meloxicam (MOBIC) 7.5 MG tablet Take 1 tablet (7.5 mg total) by mouth daily. 14 tablet 0   methocarbamol (ROBAXIN) 500 MG tablet Take 1 tablet (500 mg total) by mouth 2 (two) times daily as needed for muscle spasms. 20 tablet 0   metoCLOPramide (REGLAN) 5 MG tablet Take 5 mg by mouth daily as needed for vomiting or nausea.     mupirocin cream (BACTROBAN) 2 % Apply 1 application topically 2 (two) times daily as needed (wound care).     mupirocin ointment (BACTROBAN) 2 % Apply 1 application topically 2 (two) times daily as needed (Rash).     nystatin ointment (MYCOSTATIN) 1 application daily as needed (Skin Rash).     nystatin-triamcinolone ointment (MYCOLOG) Apply 1 application topically 2  (two) times daily. To groins 30 g 11   Oxycodone HCl 20 MG TABS Take 4 mg by mouth See admin instructions. Five times a day as needed  0   pantoprazole (PROTONIX) 20 MG tablet Take 20 mg by mouth daily as needed for heartburn  or indigestion.     predniSONE (DELTASONE) 20 MG tablet Take 20 mg by mouth daily as needed (unknown).     psyllium (METAMUCIL SMOOTH TEXTURE) 58.6 % powder Take 1 packet by mouth daily.     RHOPRESSA 0.02 % SOLN Place 1 drop into the right eye at bedtime.     ROCKLATAN 0.02-0.005 % SOLN Place 1 drop into both eyes at bedtime.     SPIRIVA HANDIHALER 18 MCG inhalation capsule 18 mcg 2 (two) times daily.     tamsulosin (FLOMAX) 0.4 MG CAPS capsule Take 1 capsule (0.4 mg total) by mouth daily. 90 capsule 3   terbinafine (LAMISIL) 250 MG tablet Take 250 mg by mouth 2 (two) times daily.     timolol (TIMOPTIC) 0.5 % ophthalmic solution Place 1 drop into both eyes 2 (two) times daily as needed (eye pressure).     VIAGRA 100 MG tablet Take 1 tablet (100 mg total) by mouth daily as needed for erectile dysfunction. 30 tablet 11   No current facility-administered medications for this visit.     Past Surgical History:  Procedure Laterality Date   BIOPSY  05/29/2019   Procedure: BIOPSY;  Surgeon: Rogene [place], MD;  [facility];  Service: Endoscopy;;  gastric ulcer   CARPAL TUNNEL RELEASE Right 07/09/2014   Procedure: RIGHT OPEN CARPAL TUNNEL RELEASE;  Surgeon: Mcarthur Rossetti, MD;  [facility];  Service: Orthopedics;  Laterality: Right;   CHOLECYSTECTOMY N/A 02/24/2014   Procedure: LAPAROSCOPIC CHOLECYSTECTOMY;  Surgeon: Coralie Keens, MD;  Location: [place];  Service: General;  Laterality: N/A;   COLONOSCOPY     CYSTOLITHOTOMY  11/ 2007      [facility] [place] [facility]/18[facility]2018   Procedure: CYSTOSCOPY FLEXIBLE;  Surgeon: Irine Seal, MD;  Location: [facility];  Service: Urology;  Laterality: N/A;   CYSTOSCOPY W/ LITHOLAPAXY / EHL   02-24-2007   dr Janice Norrie  [facility]   dental implant     No teeth at this time waiting for them to be made as of 01-30-19   ESOPHAGOGASTRODUODENOSCOPY (EGD) WITH PROPOFOL N/A 05/29/2019   Procedure: ESOPHAGOGASTRODUODENOSCOPY (EGD) WITH PROPOFOL;  Surgeon: Rogene [place], MD;  [facility];  Service: Endoscopy;  Laterality: N/A;  [place] IMPLANT N/A 09/05/2017   Procedure: GOLD SEED IMPLANT;  Surgeon: Irine Seal, MD;  Location[facility];  Service: Urology;  Laterality: N/A;   INSERTION OF SUPRAPUBIC CATHETER N/A 04/18/2018   Procedure: SUPRAPUBIC TUBE CHANGE;  Surgeon: Irine Seal, MD;  [facility];  Service: Urology;  Laterality: N/A;   IR CATHETER TUBE CHANGE  02/24/2018   LUMBAR LAMINECTOMY/DECOMPRESSION MICRODISCECTOMY Right 03/31/2021   Procedure: Bilateral Lumbar Three- Four, Lumbar Four-Five Laminectomy, Decompression;  Surgeon: Ashok Pall, MD;  Location: [place];  Service: Neurosurgery;  Laterality: Right;   SPACE OAR INSTILLATION N/A 09/05/2017   Procedure: SPACE OAR INSTILLATION;  Surgeon: Irine Seal, MD;  Location: [facility];  Service: Urology;  Laterality: N/A;   TOTAL ANKLE ARTHROPLASTY Right 04/20/2015   Procedure: TOTAL ANKLE ARTHOPLASTY;  Surgeon: Newt Minion, MD;  Location: [place];  Service: Orthopedics;  Laterality: Right;   TRANSTHORACIC ECHOCARDIOGRAM  11/16/2012   ef 55-60%,  grade 1 diastolic dysfunction/  mild dilated ascending aorta/  mild MR/ trivial TR   TRANSURETHRAL RESECTION OF PROSTATE N/A 02/03/2019   Procedure: TRANSURETHRAL RESECTION OF THE PROSTATE (TURP);  Surgeon: Irine Seal, MD;  [facility];  Service: Urology;  Laterality: N/A;   VIDEO ASSISTED THORACOSCOPY (VATS)/ LOBECTOMY Right 07/04/2015   Procedure: VIDEO ASSISTED THORACOSCOPY (VATS)/ RIGHT UPPER LOBECTOMY;  Surgeon: Melrose Nakayama, MD;  Location: [place];  Service: Thoracic;  Laterality: Right;   VIDEO BRONCHOSCOPY N/A 06/17/2015   Procedure:  VIDEO BRONCHOSCOPY;  Surgeon: Melrose Nakayama, MD;  Location: [place];  Service: Thoracic;  Laterality: N/A;   VIDEO BRONCHOSCOPY WITH ENDOBRONCHIAL NAVIGATION N/A 06/17/2015   Procedure: VIDEO BRONCHOSCOPY WITH ENDOBRONCHIAL NAVIGATION;  Surgeon: Melrose Nakayama, MD;  Location: [place];  Service: Thoracic;  Laterality: N/A;   VIDEO BRONCHOSCOPY WITH ENDOBRONCHIAL ULTRASOUND N/A 06/17/2015   Procedure: VIDEO BRONCHOSCOPY WITH ENDOBRONCHIAL ULTRASOUND;  Surgeon: Melrose Nakayama, MD;  [facility];  Service: Thoracic;  Laterality: N/A;     Allergies  Allergen Reactions   Ampicillin Other (See Comments)    Don't work   Ciprofloxacin Other (See Comments)    Doesn't work   Levaquin [Levofloxacin] Other (See Comments)    Doesn't work      Family History  Problem Relation Age of Onset   Cancer Father        Asbestos   Colon cancer Neg Hx    Colon polyps Neg Hx    Kidney disease Neg Hx    Esophageal cancer Neg Hx    Gallbladder disease Neg Hx    Heart disease Neg Hx    Diabetes Neg Hx      Social History Mr. Castilleja reports that he quit smoking about 7 years ago. His smoking use included cigarettes. He has a 20.00 pack-year smoking history. He has never used smokeless tobacco. Mr. Burgert reports no history of alcohol use.   Review of Systems CONSTITUTIONAL: No weight loss, fever, chills, weakness or fatigue.  HEENT: Eyes: No visual loss, blurred vision, double vision or yellow sclerae.No hearing loss, sneezing, congestion, runny nose or sore throat.  SKIN: No rash or itching.  CARDIOVASCULAR: per hpi RESPIRATORY: per hpi.  GASTROINTESTINAL: No anorexia, nausea, vomiting or diarrhea. No abdominal pain or blood.  GENITOURINARY: No burning on urination, no polyuria NEUROLOGICAL: No headache, dizziness, syncope, paralysis, ataxia, numbness or tingling in the extremities. No change in bowel or bladder control.  MUSCULOSKELETAL: No muscle, back pain, joint pain or  stiffness.  LYMPHATICS: No enlarged nodes. No history of splenectomy.  PSYCHIATRIC: No history of depression or anxiety.  ENDOCRINOLOGIC: No reports of sweating, cold or heat intolerance. No polyuria or polydipsia.  [name]   Physical Examination Today's Vitals   04/02/22 0819  BP: 124/80  Pulse: 94  SpO2: 99%  Weight: 240 lb 3.2 oz (109 kg)  Height: 6\' 8"  (2.032 m)   Body mass index is 26.39 kg/m.  Gen: resting comfortably, no acute distress HEENT: no scleral icterus, pupils equal round and reactive, no palptable cervical adenopathy,  CV: RRR, no m/r/g no jvd Resp: Clear to auscultation bilaterally GI: abdomen is soft, non-tender, non-distended, normal bowel sounds, no hepatosplenomegaly MSK: extremities are warm, no edema.  Skin: warm, no rash Neuro:  no focal deficits Psych: appropriate affect   Diagnostic Studies  2014 echo Study Conclusions  - Left ventricle: The cavity size was normal. Systolic   function was normal. The estimated ejection fraction was   in the range of 55% to 60%. Wall motion was normal; there   were no regional wall motion abnormalities. Doppler   parameters are consistent with abnormal left ventricular   relaxation (grade 1 diastolic  dysfunction). - Mitral valve: Mild regurgitation. - Right ventricle: The cavity size was mildly dilated. Wall   thickness was normal.   Assessment and Plan  1.SOB/DOE/chest pain - may be related purely to his extensive pulmonary history, significant deconditioning limited by severe neuropathy. With reported LE edema and chest pains cannot exclude a cardiac component. Multiple CAD risk factors - plan initially for echo, pending results will consider possible lexiscan vs coronary CTA - EKG today SR, no acute ischemic changes   F/u pending test results  Arnoldo Lenis, M.D.

## 2022-04-02 NOTE — Patient Instructions (Signed)
Medication Instructions:  Continue all current medications.  Labwork: none  Testing/Procedures: Your physician has requested that you have an echocardiogram. Echocardiography is a painless test that uses sound waves to create images of your heart. It provides your doctor with information about the size and shape of your heart and how well your heart's chambers and valves are working. This procedure takes approximately one hour. There are no restrictions for this procedure. Please do NOT wear cologne, perfume, aftershave, or lotions (deodorant is allowed). Please arrive 15 minutes prior to your appointment time. Office will contact with results via phone, letter or mychart.     Follow-Up: Pending test results   Any Other Special Instructions Will Be Listed Below (If Applicable).   If you need a refill on your cardiac medications before your next appointment, please call your pharmacy.

## 2022-04-04 ENCOUNTER — Ambulatory Visit: Payer: Medicare Other | Attending: Cardiology

## 2022-04-04 DIAGNOSIS — R0602 Shortness of breath: Secondary | ICD-10-CM

## 2022-04-04 LAB — ECHOCARDIOGRAM COMPLETE
AR max vel: 3.72 cm2
AV Area VTI: 3.58 cm2
AV Area mean vel: 3.58 cm2
AV Mean grad: 2.7 mmHg
AV Peak grad: 5.4 mmHg
Ao pk vel: 1.16 m/s
Area-P 1/2: 2.63 cm2
Calc EF: 60.3 %
MV M vel: 2.44 m/s
MV Peak grad: 23.8 mmHg
S' Lateral: 3.7 cm
Single Plane A2C EF: 58.6 %
Single Plane A4C EF: 63.1 %

## 2022-04-05 ENCOUNTER — Ambulatory Visit: Payer: Self-pay | Admitting: *Deleted

## 2022-04-05 NOTE — Patient Outreach (Deleted)
  Care Coordination TOC Note {TOC NOTES:27765}

## 2022-04-05 NOTE — Patient Outreach (Signed)
  Care Coordination   04/05/2022 Name: Jonathan [name] MRN: [id number] DOB: 08-Nov-1940   Care Coordination Outreach Attempts:  An unsuccessful telephone outreach was attempted for a scheduled appointment today. #4  Follow Up Plan:  No further outreach attempts will be made at this time. We have been unable to contact the patient to offer or enroll patient in care coordination services  Encounter Outcome:  No Answer   Care Coordination Interventions:  No, not indicated    Left message to call back with any care coordination or resource needs, otherwise no further follow-up at this time.   Demetrios Loll, BSN, RN-BC RN Care Coordinator [facility]  [facility] Direct Dial: [phone] Main #: [phone]

## 2022-04-09 ENCOUNTER — Encounter: Payer: Self-pay | Admitting: *Deleted

## 2022-04-09 ENCOUNTER — Ambulatory Visit (INDEPENDENT_AMBULATORY_CARE_PROVIDER_SITE_OTHER): Payer: Medicare Other | Admitting: Urology

## 2022-04-09 DIAGNOSIS — R030 Elevated blood-pressure reading, without diagnosis of hypertension: Secondary | ICD-10-CM | POA: Diagnosis not present

## 2022-04-09 DIAGNOSIS — R339 Retention of urine, unspecified: Secondary | ICD-10-CM

## 2022-04-09 DIAGNOSIS — Z20828 Contact with and (suspected) exposure to other viral communicable diseases: Secondary | ICD-10-CM | POA: Diagnosis not present

## 2022-04-09 DIAGNOSIS — J0101 Acute recurrent maxillary sinusitis: Secondary | ICD-10-CM | POA: Diagnosis not present

## 2022-04-09 DIAGNOSIS — J209 Acute bronchitis, unspecified: Secondary | ICD-10-CM | POA: Diagnosis not present

## 2022-04-09 DIAGNOSIS — Z6826 Body mass index (BMI) 26.0-26.9, adult: Secondary | ICD-10-CM | POA: Diagnosis not present

## 2022-04-09 NOTE — Patient Outreach (Signed)
Please disregard.  Erroneous encounter.

## 2022-04-09 NOTE — Progress Notes (Signed)
PTNS  Session # Monthly PTNS  Health & Social Factors: yes Caffeine: 1 cup Alcohol: none Daytime voids #per day: 8 Night-time voids #per night: 4 Urgency: yes Incontinence Episodes #per day: 4 Ankle used: right Treatment Setting: 5 Feeling/ Response: positive sensation Comments: N/A  Performed By: Kennyth Lose, CMA  Follow Up: Keep monthly PTNS [place]

## 2022-05-07 DIAGNOSIS — R030 Elevated blood-pressure reading, without diagnosis of hypertension: Secondary | ICD-10-CM | POA: Diagnosis not present

## 2022-05-07 DIAGNOSIS — E0865 Diabetes mellitus due to underlying condition with hyperglycemia: Secondary | ICD-10-CM | POA: Diagnosis not present

## 2022-05-07 DIAGNOSIS — K5901 Slow transit constipation: Secondary | ICD-10-CM | POA: Diagnosis not present

## 2022-05-07 DIAGNOSIS — M19072 Primary osteoarthritis, left ankle and foot: Secondary | ICD-10-CM | POA: Diagnosis not present

## 2022-05-07 DIAGNOSIS — R296 Repeated falls: Secondary | ICD-10-CM | POA: Diagnosis not present

## 2022-05-07 DIAGNOSIS — M545 Low back pain, unspecified: Secondary | ICD-10-CM | POA: Diagnosis not present

## 2022-05-07 DIAGNOSIS — R42 Dizziness and giddiness: Secondary | ICD-10-CM | POA: Diagnosis not present

## 2022-05-07 DIAGNOSIS — Z6827 Body mass index (BMI) 27.0-27.9, adult: Secondary | ICD-10-CM | POA: Diagnosis not present

## 2022-05-07 DIAGNOSIS — H8110 Benign paroxysmal vertigo, unspecified ear: Secondary | ICD-10-CM | POA: Diagnosis not present

## 2022-05-07 DIAGNOSIS — M19071 Primary osteoarthritis, right ankle and foot: Secondary | ICD-10-CM | POA: Diagnosis not present

## 2022-05-08 ENCOUNTER — Telehealth: Payer: Self-pay | Admitting: Cardiology

## 2022-05-08 NOTE — Telephone Encounter (Signed)
Patient is returning call for results. Please advise

## 2022-05-10 NOTE — Telephone Encounter (Signed)
Laurine Blazer, LPN 05/11/2444  9:50 PM EST Back to Top    Notified, copy to pcp.   Laurine Blazer, LPN 10/14/5748  5:18 PM EST     Left message to return call.   Arnoldo Lenis, MD 04/29/2022  6:17 PM EST     No worrisome findings on echo, heart function overall looks good. Symptoms may be purely due to his history of lung disease. F/u 3 months to reassess symptoms, consider if any additional cardiac testing is needed     J BrancH MD

## 2022-05-10 NOTE — Telephone Encounter (Signed)
3 mo f/u scheduled.

## 2022-05-16 ENCOUNTER — Ambulatory Visit: Payer: Medicare Other | Admitting: Urology

## 2022-05-16 DIAGNOSIS — N3941 Urge incontinence: Secondary | ICD-10-CM

## 2022-05-16 NOTE — Progress Notes (Cosign Needed Addendum)
PTNS  Session # 34 of 45  Health & Social Factors: Diabetes, CHF, taking fluid pills Caffeine: 1 cup a day Alcohol: none Daytime voids #per day: 4 Night-time voids #per night: 4 Urgency: yes Incontinence Episodes #per day: 5 Ankle used: left Treatment Setting: 5 Feeling/ Response: tingling in foot and toes Comments: n/a  Performed By: Levi Aland, CMA  Follow Up: Follow up as scheduled.

## 2022-05-16 NOTE — Progress Notes (Signed)
PTNS  Session #33 Monthly PTNS  Health & Social Factors: yes Caffeine: 1 cup Alcohol: none Daytime voids #per day: 8 Night-time voids #per night: 4 Urgency: yes Incontinence Episodes #per day: 4 Ankle used: right Treatment Setting: 5 Feeling/ Response: positive sensation Comments: N/A  Performed By: Marisue Brooklyn, CMA  Follow Up: Keep monthly PTNS [place]

## 2022-06-06 DIAGNOSIS — S46911A Strain of unspecified muscle, fascia and tendon at shoulder and upper arm level, right arm, initial encounter: Secondary | ICD-10-CM | POA: Diagnosis not present

## 2022-06-06 DIAGNOSIS — E1165 Type 2 diabetes mellitus with hyperglycemia: Secondary | ICD-10-CM | POA: Diagnosis not present

## 2022-06-06 DIAGNOSIS — R296 Repeated falls: Secondary | ICD-10-CM | POA: Diagnosis not present

## 2022-06-06 DIAGNOSIS — R0602 Shortness of breath: Secondary | ICD-10-CM | POA: Diagnosis not present

## 2022-06-06 DIAGNOSIS — S5001XA Contusion of right elbow, initial encounter: Secondary | ICD-10-CM | POA: Diagnosis not present

## 2022-06-06 DIAGNOSIS — Z6825 Body mass index (BMI) 25.0-25.9, adult: Secondary | ICD-10-CM | POA: Diagnosis not present

## 2022-06-06 DIAGNOSIS — E1143 Type 2 diabetes mellitus with diabetic autonomic (poly)neuropathy: Secondary | ICD-10-CM | POA: Diagnosis not present

## 2022-06-06 DIAGNOSIS — R5383 Other fatigue: Secondary | ICD-10-CM | POA: Diagnosis not present

## 2022-06-13 ENCOUNTER — Ambulatory Visit (INDEPENDENT_AMBULATORY_CARE_PROVIDER_SITE_OTHER): Payer: Medicare PPO | Admitting: Urology

## 2022-06-13 DIAGNOSIS — N3941 Urge incontinence: Secondary | ICD-10-CM

## 2022-06-13 DIAGNOSIS — R3915 Urgency of urination: Secondary | ICD-10-CM

## 2022-06-13 NOTE — Progress Notes (Cosign Needed Addendum)
PTNS  Session # 35 of 45  Health & Social Factors: Diabetes fluid pill, and CHF Caffeine: 1 cup Alcohol: 0 Daytime voids #per day: 8-10 Night-time voids #per night: 8 Urgency: yes Incontinence Episodes #per day: 4 Ankle used: left Treatment Setting: 12 Feeling/ Response: positive response Comments: N/A  Performed By: Marisue Brooklyn, CMA  Follow Up: Keep scheduled [place]

## 2022-07-03 ENCOUNTER — Other Ambulatory Visit: Payer: Self-pay

## 2022-07-03 ENCOUNTER — Other Ambulatory Visit: Payer: TRICARE For Life (TFL)

## 2022-07-03 ENCOUNTER — Telehealth: Payer: Self-pay

## 2022-07-03 DIAGNOSIS — Z8546 Personal history of malignant neoplasm of prostate: Secondary | ICD-10-CM

## 2022-07-03 DIAGNOSIS — N5201 Erectile dysfunction due to arterial insufficiency: Secondary | ICD-10-CM

## 2022-07-03 MED ORDER — VIAGRA 100 MG PO TABS: 100.0000 mg | ORAL_TABLET | Freq: Every day | ORAL | 6 refills | Status: DC | PRN

## 2022-07-03 NOTE — Telephone Encounter (Signed)
Patient needing refill(s) on:  VIAGRA 100 MG tablet    Pharmacy: [facility] - [place], [name] - [address] Phone: [phone]  Fax: [phone]

## 2022-07-03 NOTE — Telephone Encounter (Signed)
Refill sent.

## 2022-07-04 LAB — TESTOSTERONE: Testosterone: 413 ng/dL (ref 264–916)

## 2022-07-04 LAB — PSA: Prostate Specific Ag, Serum: <0.1 ng/mL (ref 0.0–4.0)

## 2022-07-11 ENCOUNTER — Other Ambulatory Visit: Payer: Medicare PPO

## 2022-07-12 ENCOUNTER — Encounter: Payer: Self-pay | Admitting: Urology

## 2022-07-12 ENCOUNTER — Ambulatory Visit: Payer: Medicare PPO | Admitting: Urology

## 2022-07-12 VITALS — BP 118/73 | HR 71 | Ht >= 80 in | Wt 280.4 lb

## 2022-07-12 DIAGNOSIS — N5201 Erectile dysfunction due to arterial insufficiency: Secondary | ICD-10-CM

## 2022-07-12 DIAGNOSIS — N3281 Overactive bladder: Secondary | ICD-10-CM | POA: Diagnosis not present

## 2022-07-12 DIAGNOSIS — N390 Urinary tract infection, site not specified: Secondary | ICD-10-CM

## 2022-07-12 DIAGNOSIS — Z8546 Personal history of malignant neoplasm of prostate: Secondary | ICD-10-CM | POA: Diagnosis not present

## 2022-07-12 DIAGNOSIS — N320 Bladder-neck obstruction: Secondary | ICD-10-CM

## 2022-07-12 DIAGNOSIS — N3946 Mixed incontinence: Secondary | ICD-10-CM | POA: Diagnosis not present

## 2022-07-12 DIAGNOSIS — Z87440 Personal history of urinary (tract) infections: Secondary | ICD-10-CM | POA: Diagnosis not present

## 2022-07-12 DIAGNOSIS — N393 Stress incontinence (female) (male): Secondary | ICD-10-CM

## 2022-07-12 DIAGNOSIS — N3941 Urge incontinence: Secondary | ICD-10-CM

## 2022-07-12 LAB — URINALYSIS, ROUTINE W REFLEX MICROSCOPIC
Bilirubin, UA: NEGATIVE
Glucose, UA: NEGATIVE
Ketones, UA: NEGATIVE
Leukocytes,UA: NEGATIVE
Nitrite, UA: NEGATIVE
RBC, UA: NEGATIVE
Specific Gravity, UA: 1.02 (ref 1.005–1.030)
Urobilinogen, Ur: 4 mg/dL — ABNORMAL HIGH (ref 0.2–1.0)
pH, UA: 6.5 (ref 5.0–7.5)

## 2022-07-12 LAB — MICROSCOPIC EXAMINATION: Bacteria, UA: NONE SEEN

## 2022-07-12 MED ORDER — DEPEND REAL FIT/BRIEF/MEN/L-XL MISC: 1.0000 | Freq: Two times a day (BID) | 11 refills | Status: DC

## 2022-07-12 MED ORDER — DOXYCYCLINE HYCLATE 100 MG PO CAPS
100.0000 mg | ORAL_CAPSULE | Freq: Two times a day (BID) | ORAL | 5 refills | Status: DC
Start: 2022-07-12 — End: 2022-07-26

## 2022-07-12 MED ORDER — CVS MENS GUARD MISC: 1.0000 | Freq: Two times a day (BID) | 11 refills | Status: DC

## 2022-07-12 NOTE — Progress Notes (Signed)
Subjective:  1. History of prostate cancer   2. Recurrent UTI   3. Stress incontinence, male   4. Erectile dysfunction due to arterial insufficiency   5. Bladder outlet obstruction   6. Urge incontinence    07/12/22: Jonathan [name] returns today in f/u for his history of prostate cancer treated with EXRT and ADT as noted below.  His PSA is <0.1 on 07/03/22. He fell in March and is rehabing his right ankle.  He has a history of BOO and OAB wet.  He continues with PTNS maintenance therapy.  He remains on tamsulosin.  He is using light pads for his incontinence.  He has had UTI's and his UA is ok today.  He has ED and has success with sildenafil.  His UA is unremarkable.    02/01/22: Jonathan [name] returns today to assess his response to the bactrim he was given for a Citrobacter UTI on 01/18/22.  He had some initial benefit but the dysuria has recurred.  He took his last dose last night.  His UA is clear today.   He remains on tamsulosin.  01/18/22: Jonathan [name] returns today in f/u for the history below.  His PSA remains <0.1 with a T of 313.  He has chronic incontinence and had been on PTNS but that has been held.  He is having some enuresis.  He is off of  Gemtesa and Trospium after presenting with retention on 12/05/21 but passed a voiding trial on 12/19/21.  His PVR is 17ml today.   He is having some initial dysuria that is chronic.  He was started on Tamsulosin for the retention.  He off of daily tadalafil but is having success on Viagra for ED.  He is on oxycodone for chronic pain.    10/12/21: Jonathan [name] is here today for PTNS.  His PSA remains <0.1.  He completed EXRT with ADT in 9/19 with the last lupron in 1/21.  His testosterone is up to 313.  His UA is clear.  He has incontinence and wears guards.  His IPSS is 25.  He has nocturia x 3.  He is using the [place] and trospium.   He continues to use Viagra for the ED.   He had a PET scan on 7/12 for f/u of his lung cancer.   There was no evidence of recurrent  disease.   06/22/21: Jonathan [name] returns today in f/u. He has a history of T3b N0 M0 Gleason 9 prostate cancer that was treated with EXRT and ADT.   He completed EXRT in 9/19.   His final  Lupron injection was given 1/21. His PSA was up to 0.6 in November from an undetectible level.  He has no bone pain or weight loss.  His testosterone level was 310 which was up from 286 in May.   He had a lumbar laminectomy in January and he is recovering from that with improvement in his symptoms.   He continues to have OAB wet and is using depends about 4 daily.   He has been on the PTNS maintenance therapy.  He is out of Gemtesa and his symptoms have worsened with increased nocturia.   He has ED and is using Viagra.  He is not using the injections.  He has had no hematuria or dysuria.   His IPSS is 24.    02/09/21: Jonathan [name] returns today in f/u.  His PSA was <0.1 on 07/28/20.  He didn't get labs prior to the visit.  He has OAB wet and  is has been on PTNS maintenance and Gemtesa samples..  He has frequent small voids.  He has nocturia x 3. UA is clear today.   He isn't having accidents. He has some dysuria but no hematuria.   He is getting PTNS today.   He has tadalafil and viagra that he uses prn.  He was given prostin and started using it at home and has had success, but he has been given the med by the [place] and he says the needles are too big.  He does have success with the viagra.             IPSS     Row Name 07/12/22 1100         International Prostate Symptom Score   How often have you had the sensation of not emptying your bladder? About half the time     How often have you had to urinate less than every two hours? About half the time     How often have you found you stopped and started again several times when you urinated? Not at All     How often have you found it difficult to postpone urination? Less than 1 in 5 times     How often have you had a weak urinary stream? Less than half the time     How often  have you had to strain to start urination? Less than 1 in 5 times     How many times did you typically get up at night to urinate? 3 Times     Total IPSS Score 13       Quality of Life due to urinary symptoms   If you were to spend the rest of your life with your urinary condition just the way it is now how would you feel about that? Mostly Disatisfied                     ROS:  ROS:  A complete review of systems was performed.  All systems are negative except for pertinent findings as noted.   Review of Systems  Constitutional:  Positive for malaise/fatigue.  Eyes:  Positive for blurred vision.  Respiratory:  Positive for shortness of breath.   Cardiovascular:  Positive for leg swelling.  Gastrointestinal:  Positive for constipation.  Musculoskeletal:  Positive for back pain and joint pain.  Skin:  Positive for itching and rash.  Neurological:  Positive for dizziness.  Psychiatric/Behavioral:  Positive for memory loss.     Allergies  Allergen Reactions   Ampicillin Other (See Comments)    Don't work   Levaquin [Levofloxacin] Other (See Comments)    Doesn't work    Outpatient Encounter Medications as of 07/12/2022  Medication Sig   albuterol (PROVENTIL) (2.5 MG/3ML) 0.083% nebulizer solution Take 3 mLs (2.5 mg total) by nebulization every 6 (six) hours as needed for shortness of breath.   atenolol-chlorthalidone (TENORETIC) 50-25 MG tablet Take 1 tablet by mouth at bedtime.    atorvastatin (LIPITOR) 20 MG tablet Take 20 mg by mouth daily.   capsaicin (ZOSTRIX) 0.025 % cream Apply 1 application topically daily as needed (Skin Rash).   diclofenac Sodium (VOLTAREN) 1 % GEL Apply 2 g topically 4 (four) times daily as needed (pain).   donepezil (ARICEPT) 5 MG tablet Take 5 mg by mouth daily.   doxycycline (VIBRAMYCIN) 100 MG capsule Take 1 capsule (100 mg total) by mouth every 12 (twelve) hours.   escitalopram (LEXAPRO)  5 MG tablet Take 5 mg by mouth daily.   FREESTYLE  LITE test strip    gabapentin (NEURONTIN) 800 MG tablet Take 800 mg by mouth daily.   hydroquinone 4 % cream Apply 1 application topically daily as needed (Skin Rash).   ibuprofen (ADVIL) 600 MG tablet Take 600 mg by mouth every 6 (six) hours as needed for headache.   Incontinence Supply Disposable ([employer] MENS GUARD) MISC 1 Pad by Does not apply route 2 (two) times daily.   Incontinence Supply Disposable (DEPEND REAL FIT/BRIEF/MEN/L-XL) MISC 1 Pad by Does not apply route in the morning and at bedtime.   Lancets (FREESTYLE) lancets    LANTUS SOLOSTAR 100 UNIT/ML Solostar Pen Inject 2.5 Units into the skin at bedtime.   latanoprost (XALATAN) 0.005 % ophthalmic solution Place 1 drop into both eyes at bedtime.   lidocaine (LIDODERM) 5 % Place 1 patch onto the skin daily. Remove & Discard patch within 12 hours or as directed by MD   LINZESS 72 MCG capsule Take 72 mcg by mouth daily before breakfast.   meclizine (ANTIVERT) 12.5 MG tablet Take 6.25-12.5 mg by mouth every 6 (six) hours as needed for dizziness.   meloxicam (MOBIC) 7.5 MG tablet Take 1 tablet (7.5 mg total) by mouth daily.   methocarbamol (ROBAXIN) 500 MG tablet Take 1 tablet (500 mg total) by mouth 2 (two) times daily as needed for muscle spasms.   metoCLOPramide (REGLAN) 5 MG tablet Take 5 mg by mouth daily as needed for vomiting or nausea.   mupirocin cream (BACTROBAN) 2 % Apply 1 application topically 2 (two) times daily as needed (wound care).   mupirocin ointment (BACTROBAN) 2 % Apply 1 application topically 2 (two) times daily as needed (Rash).   nystatin ointment (MYCOSTATIN) 1 application daily as needed (Skin Rash).   nystatin-triamcinolone ointment (MYCOLOG) Apply 1 application topically 2 (two) times daily. To groins   Oxycodone HCl 20 MG TABS Take 4 mg by mouth See admin instructions. Five times a day as needed   pantoprazole (PROTONIX) 20 MG tablet Take 20 mg by mouth daily as needed for heartburn or indigestion.   predniSONE  (DELTASONE) 20 MG tablet Take 20 mg by mouth daily as needed (unknown).   psyllium (METAMUCIL SMOOTH TEXTURE) 58.6 % powder Take 1 packet by mouth daily.   RHOPRESSA 0.02 % SOLN Place 1 drop into the right eye at bedtime.   ROCKLATAN 0.02-0.005 % SOLN Place 1 drop into both eyes at bedtime.   SPIRIVA HANDIHALER 18 MCG inhalation capsule 18 mcg 2 (two) times daily.   tamsulosin (FLOMAX) 0.4 MG CAPS capsule Take 1 capsule (0.4 mg total) by mouth daily.   terbinafine (LAMISIL) 250 MG tablet Take 250 mg by mouth 2 (two) times daily.   timolol (TIMOPTIC) 0.5 % ophthalmic solution Place 1 drop into both eyes 2 (two) times daily as needed (eye pressure).   VIAGRA 100 MG tablet Take 1 tablet (100 mg total) by mouth daily as needed for erectile dysfunction.   [DISCONTINUED] Incontinence Supply Disposable (FITTED BRIEFS MAXIMUM XL) MISC Use briefs as needed daily for incontinence   [DISCONTINUED] sildenafil (VIAGRA) 100 MG tablet Take 50 mg by mouth daily as needed for erectile dysfunction.   No facility-administered encounter medications on file as of 07/12/2022.    Past Medical History:  Diagnosis Date   Acquired bladder diverticulum    12/ 2012  resection diverticulum done at [facility] in [place], [name]   Age-related cataract of right eye  scheduled for catarat extraction 09-12-2017   Agent orange exposure    Aneurysm of infrarenal abdominal aorta    first dx 2017--- last CT 06-11-2017  measures 3.5cm   Anxiety    Chronic constipation    COPD with emphysema    06--12-2017  per pt no symptoms since Feb 2019   Depression    PTSD   Diabetes mellitus type 2, diet-controlled    Diabetic neuropathy    Dyslipidemia    Dyspnea on exertion    Erectile dysfunction    Feeling of incomplete bladder emptying    Gait abnormality 01/06/2020   GERD (gastroesophageal reflux disease)    Gout    09-02-2017 last flare up 3 months ago   Hiatal hernia    History of atrial fibrillation    episode post op right  lung lobectomy 07-04-2015   History of bladder stone    History of colon polyps    History of DVT of lower extremity    03/ 2019  bilateral lower extremity (superficial) ---  per treated w/ oral medication    History of gastritis    History of kidney stones    History of radiation therapy 09-14-2015  to 09-21-2015   left upper lung nodule -- 54Gy in 3 fractions (18 Gy per fraction)   Hyperplasia of prostate with lower urinary tract symptoms (LUTS)    Hypertension    Lumbar spinal stenosis    Nephrolithiasis    Neurogenic bladder    Osteoarthritis    ankle, hands   Osteoporosis    Prostate cancer urologist-  dr Saagar Tortorella/  oncologist-  dr Kathrynn Running   dx 05-24-2017-- Stage T2b,  Gleason 4+4,  PSA 22.41,  vol 38cc--- started ADT 04/ 2019,  plan external radiation therapy   Radiation fibrosis of lung    hx left upper long nodule SBRT 06/ 2017   Squamous cell carcinoma of both lungs last CT in epic dated 06-26-2017 no recurrence   dx 03/ 2017  non-small cell SCC via bronchoscopy w/ bx's by dr Dorris Fetch---  s/p  right VATS w/ right lobectomy and node dissection's 07-04-2015 /  pt had SBRT to left upper nodule Stage 1 ([facility]) completed 09-21-2015   Vertigo 2018   Wears dentures    bottom   Wears glasses     Past Surgical History:  Procedure Laterality Date   BIOPSY  05/29/2019   Procedure: BIOPSY;  Surgeon: Malissa Hippo, MD;  [facility];  Service: Endoscopy;;  gastric ulcer   CARPAL TUNNEL RELEASE Right 07/09/2014   Procedure: RIGHT OPEN CARPAL TUNNEL RELEASE;  Surgeon: Kathryne Hitch, MD;  Location: [facility];  Service: Orthopedics;  Laterality: Right;   CHOLECYSTECTOMY N/A 02/24/2014   Procedure: LAPAROSCOPIC CHOLECYSTECTOMY;  Surgeon: Abigail Miyamoto, MD;  Location: [facility];  Service: General;  Laterality: N/A;   COLONOSCOPY     CYST[facility]ITHOT[facility]  [facility] [place]   CYSTOSCOPY N/A 04/18/2018   Procedure: CYSTOSCOPY FLEXIBLE;   Surgeon: Bjorn Pippin, MD;  Location: [facility];  Service: Urology;  Laterality: N/A;   CYSTOSCOPY W/ LITHOLAPAXY / EHL  02-24-2007   dr Brunilda Payor  [facility]   dental implant     No teeth at this time waiting for them to be made as of 01-30-19   ESOPHAGOGASTRODUODENOSCOPY (EGD) WITH PROPOFOL N/A 05/29/2019   Procedure: ESOPHAGOGASTRODUODENOSCOPY (EGD) WITH PROPOFOL;  Surgeon: Malissa Hippo, MD;  [facility];  Service: Endoscopy;  Laterality: N/A;  925   GOLD SEED IMPLANT N/A 09/05/2017   Procedure: GOLD SEED IMPLANT;  Surgeon: Bjorn Pippin, MD;  Location: [facility];  Service: Urology;  Laterality: N/A;   INSERTION OF SUPRAPUBIC CATHETER N/A 04/18/2018   Procedure: SUPRAPUBIC TUBE CHANGE;  Surgeon: Bjorn Pippin, MD;  Location: [facility];  Service: Urology;  Laterality: N/A;   IR CATHETER TUBE CHANGE  02/24/2018   LUMBAR LAMINECTOMY/DECOMPRESSION MICRODISCECTOMY Right 03/31/2021   Procedure: Bilateral Lumbar Three- Four, Lumbar Four-Five Laminectomy, Decompression;  Surgeon: Coletta Memos, MD;  Location: [facility];  Service: Neurosurgery;  Laterality: Right;   SPACE OAR INSTILLATION N/A 09/05/2017   Procedure: SPACE OAR INSTILLATION;  Surgeon: Bjorn Pippin, MD;  [facility];  Service: Urology;  Laterality: N/A;   TOTAL ANKLE ARTHROPLASTY Right 04/20/2015   Procedure: TOTAL ANKLE ARTHOPLASTY;  Surgeon: Nadara Mustard, MD;  Location: [facility];  Service: Orthopedics;  Laterality: Right;   TRANSTHORACIC ECHOCARDIOGRAM  11/16/2012   ef 55-60%,  grade 1 diastolic dysfunction/  mild dilated ascending aorta/  mild MR/ trivial TR   TRANSURETHRAL RESECTION OF PROSTATE N/A 02/03/2019   Procedure: TRANSURETHRAL RESECTION OF THE PROSTATE (TURP);  Surgeon: Bjorn Pippin, MD;  Location[facility];  Service: Urology;  Laterality: N/A;   VIDEO ASSISTED THORACOSCOPY (VATS)/ LOBECTOMY Right 07/04/2015   Procedure: VIDEO ASSISTED THORACOSCOPY (VATS)/ RIGHT UPPER LOBECTOMY;  Surgeon: Loreli Slot, MD;  [facility];  Service: Thoracic;  Laterality: Right;   VIDEO BRONCHOSCOPY N/A 06/17/2015   Procedure: VIDEO BRONCHOSCOPY;  Surgeon: Loreli Slot, MD;  Location: [facility] OR;  Service: Thoracic;  Laterality: N/A;   VIDEO BRONCHOSCOPY WITH ENDOBRONCHIAL NAVIGATION N/A 06/17/2015   Procedure: VIDEO BRONCHOSCOPY WITH ENDOBRONCHIAL NAVIGATION;  Surgeon: Loreli Slot, MD;  [facility];  Service: Thoracic;  Laterality: N/A;   VIDEO BRONCHOSCOPY WITH ENDOBRONCHIAL ULTRASOUND N/A 06/17/2015   Procedure: VIDEO BRONCHOSCOPY WITH ENDOBRONCHIAL ULTRASOUND;  Surgeon: Loreli Slot, MD;  Location: [facility];  Service: Thoracic;  Laterality: N/A;    Social History   Socioeconomic History   Marital status: Married    Spouse name: Rosine Door   Number of children: 2   Years of education: college   Highest education level: Not on file  Occupational History   Occupation: retired  Tobacco Use   Smoking status: Former    Packs/day: 0.50    Years: 40.00    Additional pack years: 0.00    Total pack years: 20.00    Types: Cigarettes    Quit date: 02/13/2015    Years since quitting: 7.4   Smokeless tobacco: Never  Vaping Use   Vaping Use: Never used  Substance and Sexual Activity   Alcohol use: No    Alcohol/week: 0.0 standard drinks of alcohol   Drug use: No   Sexual activity: Not Currently  Other Topics Concern   Not on file  Social History Narrative   Lives with wife, daughter and son in law temporarily to help them out   Right Handed   Drinks caffeine occassionally   Social Determinants of Health   Financial Resource Strain: Low Risk  (02/21/2022)   Overall Financial Resource Strain (CARDIA)    Difficulty of Paying Living Expenses: Not hard at all  Food Insecurity: No Food Insecurity (11/16/2021)   Hunger Vital Sign    Worried About Running Out of Food in the Last Year: Never true    Ran Out of Food in  the Last Year: Never true  Transportation Needs:  No Transportation Needs (02/21/2022)   PRAPARE - Administrator, Civil Service (Medical): No    Lack of Transportation (Non-Medical): No  Physical Activity: Not on file  Stress: Not on file  Social Connections: Not on file  Intimate Partner Violence: Not on file    Family History  Problem Relation Age of Onset   Cancer Father        Asbestos   Colon cancer Neg Hx    Colon polyps Neg Hx    Kidney disease Neg Hx    Esophageal cancer Neg Hx    Gallbladder disease Neg Hx    Heart disease Neg Hx    Diabetes Neg Hx        Objective: Vitals:   07/12/22 1133  BP: 118/73  Pulse: 71      Physical Exam Vitals reviewed.  Constitutional:      Appearance: Normal appearance.  Neurological:     Mental Status: He is alert.     Lab Results:  No results found for this or any previous visit (from the past 24 hour(s)).  UA is clear.    BMET No results for input(s): "NA", "K", "CL", "CO2", "GLUCOSE", "BUN", "CREATININE", "CALCIUM" in the last 72 hours. PSA Lab Results  Component Value Date   PSA1 <0.1 07/03/2022   PSA1 <0.1 01/12/2022   PSA1 <0.1 09/07/2021    PSA  Date Value Ref Range Status  08/21/2019 <0.1 < OR = 4.0 ng/mL Final    Comment:    The total PSA value from this assay system is  standardized against the WHO standard. The test  result will be approximately 20% lower when compared  to the equimolar-standardized total PSA (Beckman  Coulter). Comparison of serial PSA results should be  interpreted with this fact in mind. . This test was performed using the Siemens  chemiluminescent method. Values obtained from  different assay methods cannot be used interchangeably. PSA levels, regardless of value, should not be interpreted as absolute evidence of the presence or absence of disease.   04/06/2019 <0.1 < OR = 4.0 ng/mL Final    Comment:    The total PSA value from this assay system is  standardized against the WHO standard. The test   result will be approximately 20% lower when compared  to the equimolar-standardized total PSA (Beckman  Coulter). Comparison of serial PSA results should be  interpreted with this fact in mind. . This test was performed using the Siemens  chemiluminescent method. Values obtained from  different assay methods cannot be used interchangeably. PSA levels, regardless of value, should not be interpreted as absolute evidence of the presence or absence of disease.   10/26/2008 0.69 0.10 - 4.00 ng/mL Final    Comment:    See lab report for associated comment(s)   Testosterone  Date Value Ref Range Status  07/03/2022 413 264 - 916 ng/dL Final    Comment:    Adult male reference interval is based on a population of healthy nonobese males (BMI <30) between 77 and 49 years old. Travison, et.al. JCEM [phone]. PMID: 21308657.   01/12/2022 313 264 - 916 ng/dL Final    Comment:    Adult male reference interval is based on a population of healthy nonobese males (BMI <30) between 73 and 26 years old. Travison, et.al. JCEM [phone]. PMID: 40102725.   09/07/2021 313 264 - 916 ng/dL Final    Comment:  Adult male reference interval is based on a population of healthy nonobese males (BMI <30) between 70 and 43 years old. Travison, et.al. JCEM [phone]. PMID: 16010932.    UA is unremarkable today.    Studies/Results:   Assessment & Plan: History of Prostate cancer.   His  PSA remains undetectible.  His T is normal.   OAB with incontinence with an elevated PVR.  Tamsulosin refilled. He will continue using absorbant garments.  He will return for PTNS later today.   Recurrent UTI's.  UA is clear today.  He would like doxycycline on hand.  Script sent.    ED.  He will continue the Viagra.    Meds ordered this encounter  Medications   doxycycline (VIBRAMYCIN) 100 MG capsule    Sig: Take 1 capsule (100 mg total) by mouth every 12 (twelve) hours.     Dispense:  14 capsule    Refill:  5   Incontinence Supply Disposable ([employer] MENS GUARD) MISC    Sig: 1 Pad by Does not apply route 2 (two) times daily.    Dispense:  60 each    Refill:  11   Incontinence Supply Disposable (DEPEND REAL FIT/BRIEF/MEN/L-XL) MISC    Sig: 1 Pad by Does not apply route in the morning and at bedtime.    Dispense:  60 each    Refill:  11     Orders Placed This Encounter  Procedures   Urinalysis, Routine w reflex microscopic   PSA    Standing Status:   Future    Standing Expiration Date:   07/12/2023        Return in about 6 months (around 01/11/2023) for with PSA.   CC: Lianne Moris, PA-C      Bjorn Pippin 07/12/2022 Patient ID: Lovett Calender, male   DOB: Jan 18, 1941, 82 y.o.   MRN: [id number]

## 2022-07-12 NOTE — Progress Notes (Signed)
PTNS  Session # 36 of 45 Health & Social Factors: diabetic,CHF, fluid pills Caffeine: 1 cup Alcohol: none Daytime voids #per day: 6 Night-time voids #per night: 3 Urgency: yes Incontinence Episodes #per day: 3 Ankle used: right Treatment Setting: 18 Feeling/ Response: positive sensation Comments:   Performed By: Weslie Pretlow LPN  Follow Up: keep scheduled NV

## 2022-07-19 ENCOUNTER — Ambulatory Visit: Payer: TRICARE For Life (TFL) | Admitting: Urology

## 2022-07-24 ENCOUNTER — Encounter: Payer: Self-pay | Admitting: Urology

## 2022-07-26 ENCOUNTER — Other Ambulatory Visit: Payer: Self-pay

## 2022-07-26 ENCOUNTER — Telehealth: Payer: Self-pay

## 2022-07-26 MED ORDER — DOXYCYCLINE HYCLATE 100 MG PO CAPS: 100.0000 mg | ORAL_CAPSULE | Freq: Two times a day (BID) | ORAL | 5 refills | Status: DC

## 2022-07-26 NOTE — Telephone Encounter (Signed)
Returned call to patient and he request doxycycline rx be sent to [facility] instead of [place] [facility]. Prescription sent to [facility] at patient request.

## 2022-07-26 NOTE — Telephone Encounter (Signed)
Patient called advising he needed the below medication called in to the below pharmacy.     Medication: doxycycline (VIBRAMYCIN) 100 MG capsule    Pharmacy:  [facility]  Phone: [phone]  Fax: [phone]

## 2022-07-27 ENCOUNTER — Encounter: Payer: Self-pay | Admitting: Urology

## 2022-08-08 ENCOUNTER — Ambulatory Visit: Payer: TRICARE For Life (TFL)

## 2022-08-09 ENCOUNTER — Ambulatory Visit (INDEPENDENT_AMBULATORY_CARE_PROVIDER_SITE_OTHER): Payer: Medicare PPO | Admitting: Urology

## 2022-08-09 DIAGNOSIS — N393 Stress incontinence (female) (male): Secondary | ICD-10-CM | POA: Diagnosis not present

## 2022-08-09 NOTE — Progress Notes (Signed)
PTNS  Session # 36 of 45  Health & Social Factors: diabetes, CHF, taking fluid pills Caffeine: 1 cup Alcohol: none Daytime voids #per day: 8 Night-time voids #per night: 6 Urgency: yes Incontinence Episodes #per day: 4 Ankle used: right Treatment Setting: 5 Feeling/ Response: tingling in foot Comments: n/a  Performed By: Guss Bunde, CMA  Follow Up: Follow up as scheduled.

## 2022-08-31 ENCOUNTER — Encounter: Payer: Self-pay | Admitting: *Deleted

## 2022-08-31 ENCOUNTER — Encounter: Payer: Self-pay | Admitting: Cardiology

## 2022-08-31 ENCOUNTER — Ambulatory Visit: Payer: Medicare PPO | Attending: Cardiology | Admitting: Cardiology

## 2022-08-31 VITALS — BP 128/78 | HR 80 | Ht >= 80 in | Wt 239.0 lb

## 2022-08-31 DIAGNOSIS — I10 Essential (primary) hypertension: Secondary | ICD-10-CM | POA: Diagnosis not present

## 2022-08-31 DIAGNOSIS — R0602 Shortness of breath: Secondary | ICD-10-CM | POA: Diagnosis not present

## 2022-08-31 DIAGNOSIS — R079 Chest pain, unspecified: Secondary | ICD-10-CM

## 2022-08-31 NOTE — Patient Instructions (Signed)
Medication Instructions:  Continue all current medications.   Labwork: none  Testing/Procedures: Your physician has requested that you have a lexiscan myoview. For further information please visit [email or web address]. Please follow instruction sheet, as given. Office will contact with results via phone, letter or mychart.     Follow-Up: Pending test results   Any Other Special Instructions Will Be Listed Below (If Applicable).   If you need a refill on your cardiac medications before your next appointment, please call your pharmacy.  

## 2022-08-31 NOTE — Progress Notes (Signed)
Clinical Summary Mr. Stumbo is a 82 y.o.male seen today for follow up of the following medical problems.   1.SOB/DOE/chest pain - from notes previously evaluated by pulmonary - history COPD GOLD 2 s/p RULobectomy/ RT pneumonitis     -often worst with fumes, cigarette smoke - uses motorized scooter typically, chronic leg weakness and severe neuropathy.  - sedentary lifetyle.  - DOE with walking short distances, which has progressed - some LE edema at times - occasional chest tightness. Left sided, 5/10 in severity. Tends to occur with activity. No other associated symptoms. Better with position. Lasts about 15 minutes. Occurs 3-4 times a week   CAD risk factors: HTN, HL, DM2, former smoker    Jan 2024 echo: LVEF 55-60%, grade I dd  - some progression of SOB since last visit - breathing worst in extreme heat.  - some exertional chest pains.  - PET scan showed some coronary atherosclerosis     2.History of lung cancer - COPD GOLD 2 s/p RULobectomy/ RT pneumonitis     3. AAA - 3.4 cm by PET scan, needs repeat imaging 3 years  4. HTN - has not taken meds yet today  Past Medical History:  Diagnosis Date   Acquired bladder diverticulum    12/ 2012  resection diverticulum done at [facility] in [place], [name]   Age-related cataract of right eye    scheduled for catarat extraction 09-12-2017   Agent orange exposure    Aneurysm of infrarenal abdominal aorta (HCC)    first dx 2017--- last CT 06-11-2017  measures 3.5cm   Anxiety    Chronic constipation    COPD with emphysema (HCC)    06--12-2017  per pt no symptoms since Feb 2019   Depression    PTSD   Diabetes mellitus type 2, diet-controlled (HCC)    Diabetic neuropathy (HCC)    Dyslipidemia    Dyspnea on exertion    Erectile dysfunction    Feeling of incomplete bladder emptying    Gait abnormality 01/06/2020   GERD (gastroesophageal reflux disease)    Gout    09-02-2017 last flare up 3 months ago   Hiatal hernia     History of atrial fibrillation    episode post op right lung lobectomy 07-04-2015   History of bladder stone    History of colon polyps    History of DVT of lower extremity    03/ 2019  bilateral lower extremity (superficial) ---  per treated w/ oral medication    History of gastritis    History of kidney stones    History of radiation therapy 09-14-2015  to 09-21-2015   left upper lung nodule -- 54Gy in 3 fractions (18 Gy per fraction)   Hyperplasia of prostate with lower urinary tract symptoms (LUTS)    Hypertension    Lumbar spinal stenosis    Nephrolithiasis    Neurogenic bladder    Osteoarthritis    ankle, hands   Osteoporosis    Prostate cancer [facility]) urologist-  dr wrenn/  oncologist-  dr Kathrynn Running   dx 05-24-2017-- Stage T2b,  Gleason 4+4,  PSA 22.41,  vol 38cc--- started ADT 04/ 2019,  plan external radiation therapy   Radiation fibrosis of lung (HCC)    hx left upper long nodule SBRT 06/ 2017   Squamous cell carcinoma of both lungs (HCC) last CT in epic dated 06-26-2017 no recurrence   dx 03/ 2017  non-small cell SCC via bronchoscopy w/ bx's by  dr Dorris Fetch---  s/p  right VATS w/ right lobectomy and node dissection's 07-04-2015 /  pt had SBRT to left upper nodule Stage 1 ([facility]) completed 09-21-2015   Vertigo 2018   Wears dentures    bottom   Wears glasses      Allergies  Allergen Reactions   Ampicillin Other (See Comments)    Don't work   Levaquin [Levofloxacin] Other (See Comments)    Doesn't work     Current Outpatient Medications  Medication Sig Dispense Refill   albuterol (PROVENTIL) (2.5 MG/3ML) 0.083% nebulizer solution Take 3 mLs (2.5 mg total) by nebulization every 6 (six) hours as needed for shortness of breath. 75 mL 1   atenolol-chlorthalidone (TENORETIC) 50-25 MG tablet Take 1 tablet by mouth at bedtime.      atorvastatin (LIPITOR) 20 MG tablet Take 20 mg by mouth daily.     capsaicin (ZOSTRIX) 0.025 % cream Apply 1 application topically  daily as needed (Skin Rash).     diclofenac Sodium (VOLTAREN) 1 % GEL Apply 2 g topically 4 (four) times daily as needed (pain).     donepezil (ARICEPT) 5 MG tablet Take 5 mg by mouth daily.     doxycycline (VIBRAMYCIN) 100 MG capsule Take 1 capsule (100 mg total) by mouth every 12 (twelve) hours. 14 capsule 5   escitalopram (LEXAPRO) 5 MG tablet Take 5 mg by mouth daily.     FREESTYLE LITE test strip      gabapentin (NEURONTIN) 800 MG tablet Take 800 mg by mouth daily.     hydroquinone 4 % cream Apply 1 application topically daily as needed (Skin Rash).     ibuprofen (ADVIL) 600 MG tablet Take 600 mg by mouth every 6 (six) hours as needed for headache.     Incontinence Supply Disposable ([employer] MENS GUARD) MISC 1 Pad by Does not apply route 2 (two) times daily. 60 each 11   Incontinence Supply Disposable (DEPEND REAL FIT/BRIEF/MEN/L-XL) MISC 1 Pad by Does not apply route in the morning and at bedtime. 60 each 11   Lancets (FREESTYLE) lancets      LANTUS SOLOSTAR 100 UNIT/ML Solostar Pen Inject 2.5 Units into the skin at bedtime.     latanoprost (XALATAN) 0.005 % ophthalmic solution Place 1 drop into both eyes at bedtime.     lidocaine (LIDODERM) 5 % Place 1 patch onto the skin daily. Remove & Discard patch within 12 hours or as directed by MD 30 patch 0   LINZESS 72 MCG capsule Take 72 mcg by mouth daily before breakfast.     meclizine (ANTIVERT) 12.5 MG tablet Take 6.25-12.5 mg by mouth every 6 (six) hours as needed for dizziness.     meloxicam (MOBIC) 7.5 MG tablet Take 1 tablet (7.5 mg total) by mouth daily. 14 tablet 0   methocarbamol (ROBAXIN) 500 MG tablet Take 1 tablet (500 mg total) by mouth 2 (two) times daily as needed for muscle spasms. 20 tablet 0   metoCLOPramide (REGLAN) 5 MG tablet Take 5 mg by mouth daily as needed for vomiting or nausea.     mupirocin cream (BACTROBAN) 2 % Apply 1 application topically 2 (two) times daily as needed (wound care).     mupirocin ointment (BACTROBAN)  2 % Apply 1 application topically 2 (two) times daily as needed (Rash).     nystatin ointment (MYCOSTATIN) 1 application daily as needed (Skin Rash).     nystatin-triamcinolone ointment (MYCOLOG) Apply 1 application topically 2 (two) times  daily. To groins 30 g 11   Oxycodone HCl 20 MG TABS Take 4 mg by mouth See admin instructions. Five times a day as needed  0   pantoprazole (PROTONIX) 20 MG tablet Take 20 mg by mouth daily as needed for heartburn or indigestion.     predniSONE (DELTASONE) 20 MG tablet Take 20 mg by mouth daily as needed (unknown).     psyllium (METAMUCIL SMOOTH TEXTURE) 58.6 % powder Take 1 packet by mouth daily.     RHOPRESSA 0.02 % SOLN Place 1 drop into the right eye at bedtime.     ROCKLATAN 0.02-0.005 % SOLN Place 1 drop into both eyes at bedtime.     SPIRIVA HANDIHALER 18 MCG inhalation capsule 18 mcg 2 (two) times daily.     tamsulosin (FLOMAX) 0.4 MG CAPS capsule Take 1 capsule (0.4 mg total) by mouth daily. 90 capsule 3   terbinafine (LAMISIL) 250 MG tablet Take 250 mg by mouth 2 (two) times daily.     timolol (TIMOPTIC) 0.5 % ophthalmic solution Place 1 drop into both eyes 2 (two) times daily as needed (eye pressure).     VIAGRA 100 MG tablet Take 1 tablet (100 mg total) by mouth daily as needed for erectile dysfunction. 30 tablet 6   No current facility-administered medications for this visit.     Past Surgical History:  Procedure Laterality Date   BIOPSY  05/29/2019   Procedure: BIOPSY;  Surgeon: Malissa Hippo, MD;  [facility];  Service: Endoscopy;;  gastric ulcer   CARPAL TUNNEL RELEASE Right 07/09/2014   Procedure: RIGHT OPEN CARPAL TUNNEL RELEASE;  Surgeon: Kathryne Hitch, MD;  Location: [facility];  Service: Orthopedics;  Laterality: Right;   CHOLECYSTECTOMY N/A 02/24/2014   Procedure: LAPAROSCOPIC CHOLECYSTECTOMY;  Surgeon: Abigail Miyamoto, MD;  Location: [facility];  Service: General;  Laterality: N/A;   COLONOSCOPY      CYST[facility]ITHOT[facility]Y  11/ [facility] [place]   CYSTOSCOPY N/A 04/18/2018   Procedure: CYSTOSCOPY FLEXIBLE;  Surgeon: Bjorn Pippin, MD;  Location: [facility];  Service: Urology;  Laterality: N/A;   CYSTOSCOPY W/ LITHOLAPAXY / EHL  02-24-2007   dr Brunilda Payor  [facility]   dental implant     No teeth at this time waiting for them to be made as of 01-30-19   ESOPHAGOGASTRODUODENOSCOPY (EGD) WITH PROPOFOL N/A 05/29/2019   Procedure: ESOPHAGOGASTRODUODENOSCOPY (EGD) WITH PROPOFOL;  Surgeon: Malissa Hippo, MD;  [facility];  Service: Endoscopy;  Laterality: N/A;  925   GOLD SEED IMPLANT N/A 09/05/2017   Procedure: GOLD SEED IMPLANT;  Surgeon: Bjorn Pippin, MD;  Location: [facility];  Service: Urology;  Laterality: N/A;   INSERTION OF SUPRAPUBIC CATHETER N/A 04/18/2018   Procedure: SUPRAPUBIC TUBE CHANGE;  Surgeon: Bjorn Pippin, MD;  Location: [facility];  Service: Urology;  Laterality: N/A;   IR CATHETER TUBE CHANGE  02/24/2018   LUMBAR LAMINECTOMY/DECOMPRESSION MICRODISCECTOMY Right 03/31/2021   Procedure: Bilateral Lumbar Three- Four, Lumbar Four-Five Laminectomy, Decompression;  Surgeon: Coletta Memos, MD;  Location: [facility];  Service: Neurosurgery;  Laterality: Right;   SPACE OAR INSTILLATION N/A 09/05/2017   Procedure: SPACE OAR INSTILLATION;  Surgeon: Bjorn Pippin, MD;  Location[facility];  Service: Urology;  Laterality: N/A;   TOTAL ANKLE ARTHROPLASTY Right 04/20/2015   Procedure: TOTAL ANKLE ARTHOPLASTY;  Surgeon: Nadara Mustard, MD;  Location: [facility];  Service: Orthopedics;  Laterality: Right;  TRANSTHORACIC ECHOCARDIOGRAM  11/16/2012   ef 55-60%,  grade 1 diastolic dysfunction/  mild dilated ascending aorta/  mild MR/ trivial TR   TRANSURETHRAL RESECTION OF PROSTATE N/A 02/03/2019   Procedure: TRANSURETHRAL RESECTION OF THE PROSTATE (TURP);  Surgeon: Bjorn Pippin, MD;  Location: [facility];  Service: Urology;  Laterality: N/A;   VIDEO ASSISTED THORACOSCOPY (VATS)/ LOBECTOMY Right  07/04/2015   Procedure: VIDEO ASSISTED THORACOSCOPY (VATS)/ RIGHT UPPER LOBECTOMY;  Surgeon: Loreli Slot, MD;  Location: [facility] OR;  Service: Thoracic;  Laterality: Right;   VIDEO BRONCHOSCOPY N/A 06/17/2015   Procedure: VIDEO BRONCHOSCOPY;  Surgeon: Loreli Slot, MD;  Location: [facility] OR;  Service: Thoracic;  Laterality: N/A;   VIDEO BRONCHOSCOPY WITH ENDOBRONCHIAL NAVIGATION N/A 06/17/2015   Procedure: VIDEO BRONCHOSCOPY WITH ENDOBRONCHIAL NAVIGATION;  Surgeon: Loreli Slot, MD;  Location[facility];  Service: Thoracic;  Laterality: N/A;   VIDEO BRONCHOSCOPY WITH ENDOBRONCHIAL ULTRASOUND N/A 06/17/2015   Procedure: VIDEO BRONCHOSCOPY WITH ENDOBRONCHIAL ULTRASOUND;  Surgeon: Loreli Slot, MD;  Location: [facility];  Service: Thoracic;  Laterality: N/A;     Allergies  Allergen Reactions   Ampicillin Other (See Comments)    Don't work   Levaquin [Levofloxacin] Other (See Comments)    Doesn't work      Family History  Problem Relation Age of Onset   Cancer Father        Asbestos   Colon cancer Neg Hx    Colon polyps Neg Hx    Kidney disease Neg Hx    Esophageal cancer Neg Hx    Gallbladder disease Neg Hx    Heart disease Neg Hx    Diabetes Neg Hx      Social History Mr. Sutphen reports that he quit smoking about 7 years ago. His smoking use included cigarettes. He has a 20.00 pack-year smoking history. He has never used smokeless tobacco. Mr. Striegel reports no history of alcohol use.   Review of Systems CONSTITUTIONAL: No weight loss, fever, chills, weakness or fatigue.  HEENT: Eyes: No visual loss, blurred vision, double vision or yellow sclerae.No hearing loss, sneezing, congestion, runny nose or sore throat.  SKIN: No rash or itching.  CARDIOVASCULAR: per hpi RESPIRATORY: per hpi GASTROINTESTINAL: No anorexia, nausea, vomiting or diarrhea. No abdominal pain or blood.  GENITOURINARY: No burning on urination, no polyuria NEUROLOGICAL: No headache,  dizziness, syncope, paralysis, ataxia, numbness or tingling in the extremities. No change in bowel or bladder control.  MUSCULOSKELETAL: No muscle, back pain, joint pain or stiffness.  LYMPHATICS: No enlarged nodes. No history of splenectomy.  PSYCHIATRIC: No history of depression or anxiety.  ENDOCRINOLOGIC: No reports of sweating, cold or heat intolerance. No polyuria or polydipsia.  [name]   Physical Examination Today's Vitals   08/31/22 0826  BP: 138/88  Pulse: 80  SpO2: 90%  Weight: 239 lb (108.4 kg)  Height: 6\' 8"  (2.032 m)   Body mass index is 26.26 kg/m.  Gen: resting comfortably, no acute distress HEENT: no scleral icterus, pupils equal round and reactive, no palptable cervical adenopathy,  CV: RRR, no m/rg, no jvd Resp: Clear to auscultation bilaterally GI: abdomen is soft, non-tender, non-distended, normal bowel sounds, no hepatosplenomegaly MSK: extremities are warm, no edema.  Skin: warm, no rash Neuro:  no focal deficits Psych: appropriate affect   Diagnostic Studies  2014 echo Study Conclusions  - Left ventricle: The cavity size was normal. Systolic   function was normal. The estimated ejection fraction was   in the range of  55% to 60%. Wall motion was normal; there   were no regional wall motion abnormalities. Doppler   parameters are consistent with abnormal left ventricular   relaxation (grade 1 diastolic dysfunction). - Mitral valve: Mild regurgitation. - Right ventricle: The cavity size was mildly dilated. Wall   thickness was normal.  Jan 2024 echo 1. Left ventricular ejection fraction, by estimation, is 55 to 60%. The  left ventricle has normal function. The left ventricle has no regional  wall motion abnormalities. Left ventricular diastolic parameters are  consistent with Grade I diastolic  dysfunction (impaired relaxation).   2. Right ventricular systolic function is normal. The right ventricular  size is normal. Tricuspid regurgitation signal  is inadequate for assessing  PA pressure.   3. The mitral valve is normal in structure. Trivial mitral valve  regurgitation. No evidence of mitral stenosis.   4. The aortic valve has an indeterminant number of cusps. There is mild  calcification of the aortic valve. There is mild thickening of the aortic  valve. Aortic valve regurgitation is not visualized. No aortic stenosis is  present.   5. Aortic dilatation noted. There is mild dilatation of the aortic root,  measuring 41 mm.   6. The inferior vena cava is normal in size with greater than 50%  respiratory variability, suggesting right atrial pressure of 3 mmHg.    Assessment and Plan  1.SOB/DOE/chest pain - may be related purely to his extensive pulmonary history, significant deconditioning limited by severe neuropathy. With reported LE edema and chest pains cannot exclude a cardiac component. Multiple CAD risk factors - echo was benigin - with exertional chest pain and coronary atherosclerosis on recent PET scan will plan for lexiscan to further evaluate  2. HTN - at goal based on manual recheck, continue current meds      F/u pending lexiscan results, if benign can f/u just as needed.     Antoine Poche, M.D.

## 2022-09-04 ENCOUNTER — Ambulatory Visit (HOSPITAL_COMMUNITY)
Admission: RE | Admit: 2022-09-04 | Discharge: 2022-09-04 | Disposition: A | Discharge status: Home / Self Care | Payer: No Typology Code available for payment source | Source: Ambulatory Visit | Attending: Cardiology | Admitting: Cardiology

## 2022-09-04 ENCOUNTER — Encounter (HOSPITAL_COMMUNITY)
Admission: RE | Admit: 2022-09-04 | Discharge: 2022-09-04 | Disposition: A | Discharge status: Home / Self Care | Payer: Medicare PPO | Source: Ambulatory Visit | Attending: Cardiology | Admitting: Cardiology

## 2022-09-04 DIAGNOSIS — R079 Chest pain, unspecified: Secondary | ICD-10-CM | POA: Diagnosis not present

## 2022-09-04 LAB — NM MYOCAR MULTI W/SPECT W/WALL MOTION / EF
LV dias vol: 100 mL (ref 62–150)
LV sys vol: 37 mL
Nuc Stress EF: 63 %
Peak HR: 101 {beats}/min
RATE: 0.3
Rest HR: 81 {beats}/min
Rest Nuclear Isotope Dose: 10.2 mCi
SDS: 1
SRS: 3
SSS: 4
ST Depression (mm): 0 mm
Stress Nuclear Isotope Dose: 32 mCi
TID: 1.26

## 2022-09-04 MED ORDER — REGADENOSON 0.4 MG/5ML IV SOLN
INTRAVENOUS | Status: AC
  Administered 2022-09-04: 0.4 mg via INTRAVENOUS
  Filled 2022-09-04: qty 5

## 2022-09-04 MED ORDER — SODIUM CHLORIDE FLUSH 0.9 % IV SOLN
INTRAVENOUS | Status: AC
  Administered 2022-09-04: 10 mL via INTRAVENOUS
  Filled 2022-09-04: qty 10

## 2022-09-04 MED ORDER — TECHNETIUM TC 99M TETROFOSMIN IV KIT
30.0000 | PACK | Freq: Once | INTRAVENOUS | Status: AC | PRN
  Administered 2022-09-04: 32 via INTRAVENOUS

## 2022-09-04 MED ORDER — TECHNETIUM TC 99M TETROFOSMIN IV KIT
10.0000 | PACK | Freq: Once | INTRAVENOUS | Status: AC | PRN
  Administered 2022-09-04: 10.2 via INTRAVENOUS

## 2022-09-06 ENCOUNTER — Telehealth: Payer: Self-pay | Admitting: *Deleted

## 2022-09-06 ENCOUNTER — Ambulatory Visit: Payer: TRICARE For Life (TFL)

## 2022-09-06 NOTE — Telephone Encounter (Signed)
Lesle Chris, LPN 6/64/4034 74:25 AM EDT Back to Top    Notified, copy to pcp.

## 2022-09-06 NOTE — Telephone Encounter (Signed)
-----   Message from Antoine Poche, MD sent at 09/05/2022 10:53 AM EDT ----- Overall stress test looks good, no evidence of any severe significant blockages. Would f/u with Philis Nettle 3 weeks reassess symptoms and discuss results in more detail  Jonathan Ferry MD

## 2022-09-10 ENCOUNTER — Ambulatory Visit: Payer: TRICARE For Life (TFL)

## 2022-09-12 ENCOUNTER — Ambulatory Visit: Payer: No Typology Code available for payment source

## 2022-09-18 ENCOUNTER — Ambulatory Visit: Payer: No Typology Code available for payment source

## 2022-09-26 ENCOUNTER — Ambulatory Visit: Payer: No Typology Code available for payment source

## 2022-09-26 DIAGNOSIS — N393 Stress incontinence (female) (male): Secondary | ICD-10-CM

## 2022-09-26 NOTE — Progress Notes (Addendum)
PTNS  Session # 37  Health & Social Factors: Diabetes, Fluid Pills  and CHF Caffeine: 0 Alcohol: 0 Daytime voids #per day: 8 Night-time voids #per night: 4 Urgency: 4 Incontinence Episodes #per day: 4 Ankle used: left Treatment Setting: 13 Feeling/ Response: Positive Response Comments: N/A  Performed By: Kennyth Lose, CMA  Follow Up: Keep Nurse visit

## 2022-09-28 ENCOUNTER — Ambulatory Visit: Payer: No Typology Code available for payment source | Attending: Nurse Practitioner | Admitting: Nurse Practitioner

## 2022-09-28 NOTE — Progress Notes (Deleted)
  Cardiology Office Note:  .   Date:  09/28/2022  ID:  Jonathan [name], DOB Mar 17, 1941, MRN [id number] PCP: Lianne Moris, PA-C  [facility] Providers Cardiologist:  Dina Rich, MD { Click to update primary MD,subspecialty MD or APP then REFRESH:1}   History of Present Illness: .   HALFORD SPAYD is a 82 y.o. male with a PMH of HTN, HLD, T2DM, former smoker, SHOB/DOE, hx of chest pain, AAA, and hx of COPD and lung cancer, s/p right lung lobectomy and RT pneumonitis, who presents today for scheduled follow-up.   Last seen by Dr. Dina Rich on August 31, 2022. Patient endorsed shortness of breath/DOE and chest pain. Due to multiple CAD risk factors, Lexiscan was arranged, low risk and reassuring results.   Today he presents for follow-up. He states...   ROS: ***  Studies Reviewed: .        *** Risk Assessment/Calculations:   {Does this patient have ATRIAL FIBRILLATION?[phone]} No BP recorded.  {Refresh Note OR Click here to enter BP  :1}***       Physical Exam:   VS:  There were no vitals taken for this visit.   Wt Readings from Last 3 Encounters:  08/31/22 239 lb (108.4 kg)  07/12/22 280 lb 6.4 oz (127.2 kg)  04/02/22 240 lb 3.2 oz (109 kg)    GEN: Well nourished, well developed in no acute distress NECK: No JVD; No carotid bruits CARDIAC: ***RRR, no murmurs, rubs, gallops RESPIRATORY:  Clear to auscultation without rales, wheezing or rhonchi  ABDOMEN: Soft, non-tender, non-distended EXTREMITIES:  No edema; No deformity   ASSESSMENT AND PLAN: .   ***    {Are you ordering a CV Procedure (e.g. stress test, cath, DCCV, TEE, etc)?   Press F2        :[id number]}  Dispo: ***  Signed, Sharlene Dory, NP

## 2022-10-01 ENCOUNTER — Encounter: Payer: Self-pay | Admitting: Nurse Practitioner

## 2022-10-01 ENCOUNTER — Encounter: Payer: Self-pay | Admitting: *Deleted

## 2022-10-01 NOTE — Patient Outreach (Signed)
   Care Coordination 10/01/2022   Unable to maintain contact with patient. RN Care Coordination Goals closed and RNCC removed from care plan. Patient can reengage with Care Coordination team if needed in the future.   Demetrios Loll, BSN, RN-BC RN Care Coordinator [facility]  [facility] Direct Dial: [phone] Main #: [phone]

## 2022-10-04 ENCOUNTER — Ambulatory Visit: Payer: TRICARE For Life (TFL)

## 2022-10-09 ENCOUNTER — Encounter: Payer: Self-pay | Admitting: Nurse Practitioner

## 2022-10-09 ENCOUNTER — Ambulatory Visit: Payer: No Typology Code available for payment source | Attending: Nurse Practitioner | Admitting: Nurse Practitioner

## 2022-10-09 VITALS — BP 108/58 | HR 93 | Ht >= 80 in | Wt 236.8 lb

## 2022-10-09 DIAGNOSIS — I7143 Infrarenal abdominal aortic aneurysm, without rupture: Secondary | ICD-10-CM

## 2022-10-09 DIAGNOSIS — J449 Chronic obstructive pulmonary disease, unspecified: Secondary | ICD-10-CM

## 2022-10-09 DIAGNOSIS — I10 Essential (primary) hypertension: Secondary | ICD-10-CM | POA: Diagnosis not present

## 2022-10-09 DIAGNOSIS — I2510 Atherosclerotic heart disease of native coronary artery without angina pectoris: Secondary | ICD-10-CM

## 2022-10-09 DIAGNOSIS — C3402 Malignant neoplasm of left main bronchus: Secondary | ICD-10-CM

## 2022-10-09 DIAGNOSIS — Z87891 Personal history of nicotine dependence: Secondary | ICD-10-CM

## 2022-10-09 DIAGNOSIS — R0609 Other forms of dyspnea: Secondary | ICD-10-CM

## 2022-10-09 NOTE — Patient Instructions (Addendum)
Medication Instructions:  Your physician recommends that you continue on your current medications as directed. Please refer to the Current Medication list given to you today.  Labwork: NONE  Testing/Procedures: NONE  Follow-Up: Your physician recommends that you schedule a follow-up appointment in: 3 Months with Philis Nettle   Any Other Special Instructions Will Be Listed Below (If Applicable). Referral sent to Pulmonology in [place]   If you need a refill on your cardiac medications before your next appointment, please call your pharmacy.

## 2022-10-09 NOTE — Progress Notes (Signed)
Cardiology Office Note:  .   Date:  10/09/2022  ID:  Jonathan [name], DOB Mar 12, 1941, MRN [id number] PCP: Lianne Moris, PA-C  [facility] Providers Cardiologist:  Dina Rich, MD    History of Present Illness: .   Jonathan [name] is a 82 y.o. male with a PMH of coronary atherosclerosis, HTN, HLD, T2DM, former smoker, SHOB/DOE, hx of chest pain, AAA, and hx of COPD and lung cancer, s/p right lung lobectomy RT pneumonitis, and Agent Orange exposure, who presents today for scheduled follow-up.   Last seen by Dr. Dina Rich on August 31, 2022. Patient endorsed shortness of breath/DOE and chest pain. Due to multiple CAD risk factors, Lexiscan was arranged, low risk and reassuring results.   Today he presents for follow-up. He states his dyspnea on exertion is getting worse and more progressive. Denies any chest pain, palpitations, syncope, presyncope, dizziness, orthopnea, PND, swelling or significant weight changes, acute bleeding, or claudication.   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   No ST deviation was noted.   There is large moderate intensity inferior defect with mild reversibility. The defect has relatively preserved wall motion. There is significant adjacent gut radiotracer uptake that likely affects findings. Inferior infact with mild peri-infarct ischemia vs gut related artifact, either finding would support low risk   Left ventricular function is normal. Nuclear stress EF: 63 %. The left  ventricular ejection fraction is normal (55-65%). End diastolic cavity size is normal.  Echo 03/2022: 1. Left ventricular ejection fraction, by estimation, is 55 to 60%. The  left ventricle has normal function. The left ventricle has no regional  wall motion abnormalities. Left ventricular diastolic parameters are  consistent with Grade I diastolic  dysfunction (impaired relaxation).   2. Right ventricular systolic function is normal. The right ventricular  size is normal. Tricuspid regurgitation signal is inadequate for assessing  PA pressure.   3. The mitral valve is normal in structure. Trivial mitral valve  regurgitation. No evidence of mitral stenosis.   4. The aortic valve has an indeterminant number of cusps. There is mild  calcification of the aortic valve. There is mild thickening of the aortic  valve. Aortic valve regurgitation is not visualized. No aortic stenosis is  present.   5. Aortic dilatation noted. There is mild dilatation of the aortic root,  measuring 41 mm.   6. The inferior vena cava is normal in size with greater than 50%  respiratory variability, suggesting right atrial pressure of 3 mmHg.   Comparison(s): Echocardiogram done 11/17/12 showed an EF of 55-60%.  Physical Exam:   VS:  BP (!) 108/58   Pulse 93   Ht 6\' 8"  (2.032 m)   Wt 236 lb 12.8 oz (107.4 kg)   SpO2 95%   BMI 26.01 kg/m    Wt Readings from Last 3 Encounters:  10/09/22 236 lb 12.8 oz (107.4 kg)  08/31/22 239 lb (108.4 kg)  07/12/22 280 lb 6.4 oz (127.2  kg)    GEN: Well nourished, well developed in no acute distress NECK: No JVD; No carotid bruits CARDIAC: S1/S2, RRR, no murmurs, rubs, gallops RESPIRATORY:  Clear to auscultation without rales, wheezing or rhonchi  ABDOMEN: Soft, non-tender, non-distended EXTREMITIES:  No edema, wearing brace along right lower ankle; No deformity   ASSESSMENT AND PLAN: .    Coronary atherosclerosis, dyspnea on exertion Denies any chest pain. Does admit  to progressive DOE. Recent NST was benign without evidence of severe or significant blockages. TTE 03/2022 showed normal EF, no RWMA. EKG negative for acute ischemic changes today. Symptoms appear to be pulmonary in etiology and has a significant pulmonary PMH - see below. Continue current medication regimen. ED precautions discussed.   HTN BP stable. Discussed to monitor BP at home at least 2 hours after medications and sitting for 5-10 minutes. No medication changes at this time. Heart healthy diet encouraged.   AAA PET imaging 09/2021 revealed similar infrarenal AAA at 3.4 cm, was recommended to follow-up via ultrasound every 3 years. Repeat abdominal ultrasound will be due 09/2024. Care and ED precautions discussed. No medication changes at this time. Heart healthy diet encouraged.   COPD, lung cancer, s/p right lung lobectomy, former smoker Does admit to worsening DOE as mentioned above. Pt states he was previously exposed to [employer]. Recent cardiac workup reassuring. Will refer to pulmonology for further evaluation.   Dispo: Follow-up with me or APP in 3 months or sooner if anything changes.   Signed, Sharlene Dory, NP

## 2022-10-10 ENCOUNTER — Ambulatory Visit (INDEPENDENT_AMBULATORY_CARE_PROVIDER_SITE_OTHER): Payer: Medicare PPO | Admitting: Pulmonary Disease

## 2022-10-10 ENCOUNTER — Encounter: Payer: Self-pay | Admitting: Pulmonary Disease

## 2022-10-10 VITALS — BP 104/62 | HR 98 | Ht >= 80 in | Wt 237.0 lb

## 2022-10-10 DIAGNOSIS — H903 Sensorineural hearing loss, bilateral: Secondary | ICD-10-CM | POA: Insufficient documentation

## 2022-10-10 DIAGNOSIS — J449 Chronic obstructive pulmonary disease, unspecified: Secondary | ICD-10-CM | POA: Diagnosis not present

## 2022-10-10 NOTE — Addendum Note (Signed)
Addended by: Oretha Milch on: 10/10/2022 09:35 AM   Modules accepted: Orders

## 2022-10-10 NOTE — Progress Notes (Signed)
Subjective:    Patient ID: Jonathan [name], male    DOB: Apr 29, 1940, 82 y.o.   MRN: [id number]  HPI 82 year old veteran, ex-smoker, referred for evaluation of shortness of breath. He ambulates with a walker.  He complains of shortness of breath after walking about 200 feet.  This has been going on for several months. He has obtained oxygen from the [place] but persisted.  He also has a scooter and a ramp at his home. He is a retired Cytogeneticist since 1985  He carries a diagnosis of COPD and is maintained on Spiriva.  He rarely needs albuterol He underwent cardiology evaluation which I reviewed, Lexiscan 08/2022 was low risk and echo 1/20 showed normal LV function and normal RV function He has deconditioning and ambulates with a walker  He denies cough or frequent chest colds   PMH : coronary atherosclerosis, HTN, HLD, T2DM,  Infrarenal 3.4 cm AAA,   COPD and lung cancer, s/p right lung lobectomy 05/2015 LUL nodule s/p SBR 08/2015 Agent Orange exposure  Chronic opiate use Prostate cancer, neurogenic bladder with incontinence  Significant tests/ events reviewed  PFTs 05/2015 >> moderate airway obstruction, ratio 66, FEV1 59% of 68%, TLC 82%, DLCO 53%  PET scan 09/2021 SUV 1.2 left upper lobe -favor scarring CT chest 01/2020 right upper lobe scarring, left upper lobe density   Past Medical History:  Diagnosis Date   Acquired bladder diverticulum    12/ 2012  resection diverticulum done at [facility] in [place], [name]   Age-related cataract of right eye    scheduled for catarat extraction 09-12-2017   Agent orange exposure    Aneurysm of infrarenal abdominal aorta (HCC)    first dx 2017--- last CT 06-11-2017  measures 3.5cm   Anxiety    Chronic constipation    COPD with emphysema (HCC)    06--12-2017  per pt no symptoms since Feb 2019   Depression    PTSD   Diabetes mellitus type 2, diet-controlled (HCC)    Diabetic neuropathy (HCC)    Dyslipidemia    Dyspnea on exertion    Erectile  dysfunction    Feeling of incomplete bladder emptying    Gait abnormality 01/06/2020   GERD (gastroesophageal reflux disease)    Gout    09-02-2017 last flare up 3 months ago   Hiatal hernia    History of atrial fibrillation    episode post op right lung lobectomy 07-04-2015   History of bladder stone    History of colon polyps    History of DVT of lower extremity    03/ 2019  bilateral lower extremity (superficial) ---  per treated w/ oral medication    History of gastritis    History of kidney stones    History of radiation therapy 09-14-2015  to 09-21-2015   left upper lung nodule -- 54Gy in 3 fractions (18 Gy per fraction)   Hyperplasia of prostate with lower urinary tract symptoms (LUTS)    Hypertension    Lumbar spinal stenosis    Nephrolithiasis    Neurogenic bladder    Osteoarthritis    ankle, hands   Osteoporosis    Prostate cancer [facility]) urologist-  dr wrenn/  oncologist-  dr Kathrynn Running   dx 05-24-2017-- Stage T2b,  Gleason 4+4,  PSA 22.41,  vol 38cc--- started ADT 04/ 2019,  plan external radiation therapy   Radiation fibrosis of lung (HCC)    hx left upper long nodule SBRT 06/ 2017   Squamous cell carcinoma  of both lungs (HCC) last CT in epic dated 06-26-2017 no recurrence   dx 03/ 2017  non-small cell SCC via bronchoscopy w/ bx's by dr Dorris Fetch---  s/p  right VATS w/ right lobectomy and node dissection's 07-04-2015 /  pt had SBRT to left upper nodule Stage 1 ([facility]) completed 09-21-2015   Vertigo 2018   Wears dentures    bottom   Wears glasses    Past Surgical History:  Procedure Laterality Date   BIOPSY  05/29/2019   Procedure: BIOPSY;  Surgeon: Malissa Hippo, MD;  [facility];  Service: Endoscopy;;  gastric ulcer   CARPAL TUNNEL RELEASE Right 07/09/2014   Procedure: RIGHT OPEN CARPAL TUNNEL RELEASE;  Surgeon: Kathryne Hitch, MD;  Location: [facility];  Service: Orthopedics;  Laterality: Right;   CHOLECYSTECTOMY N/A 02/24/2014    Procedure: LAPAROSCOPIC CHOLECYSTECTOMY;  Surgeon: Abigail Miyamoto, MD;  Location: [facility];  Service: General;  Laterality: N/A;   COLONOSCOPY     CYST[facility]ITHOT[facility]  [facility]/ [facility] [place]   CYSTOSCOPY N/A 04/18/2018   Procedure: CYSTOSCOPY FLEXIBLE;  Surgeon: Bjorn Pippin, MD;  Location: [facility];  Service: Urology;  Laterality: N/A;   CYSTOSCOPY W/ LITHOLAPAXY / EHL  02-24-2007   dr Brunilda Payor  [facility]   dental implant     No teeth at this time waiting for them to be made as of 01-30-19   ESOPHAGOGASTRODUODENOSCOPY (EGD) WITH PROPOFOL N/A 05/29/2019   Procedure: ESOPHAGOGASTRODUODENOSCOPY (EGD) WITH PROPOFOL;  Surgeon: Malissa Hippo, MD;  [facility];  Service: Endoscopy;  Laterality: N/A;  925   GOLD SEED IMPLANT N/A 09/05/2017   Procedure: GOLD SEED IMPLANT;  Surgeon: Bjorn Pippin, MD;  Location: [facility];  Service: Urology;  Laterality: N/A;   INSERTION OF SUPRAPUBIC CATHETER N/A 04/18/2018   Procedure: SUPRAPUBIC TUBE CHANGE;  Surgeon: Bjorn Pippin, MD;  Location: [facility];  Service: Urology;  Laterality: N/A;   IR CATHETER TUBE CHANGE  02/24/2018   LUMBAR LAMINECTOMY/DECOMPRESSION MICRODISCECTOMY Right 03/31/2021   Procedure: Bilateral Lumbar Three- Four, Lumbar Four-Five Laminectomy, Decompression;  Surgeon: Coletta Memos, MD;  Location: [facility];  Service: Neurosurgery;  Laterality: Right;   SPACE OAR INSTILLATION N/A 09/05/2017   Procedure: SPACE OAR INSTILLATION;  Surgeon: Bjorn Pippin, MD;  Location: [facility];  Service: Urology;  Laterality: N/A;   TOTAL ANKLE ARTHROPLASTY Right 04/20/2015   Procedure: TOTAL ANKLE ARTHOPLASTY;  Surgeon: Nadara Mustard, MD;  Location: [facility];  Service: Orthopedics;  Laterality: Right;   TRANSTHORACIC ECHOCARDIOGRAM  11/16/2012   ef 55-60%,  grade 1 diastolic dysfunction/  mild dilated ascending aorta/  mild MR/ trivial TR   TRANSURETHRAL RESECTION OF PROSTATE N/A 02/03/2019   Procedure: TRANSURETHRAL  RESECTION OF THE PROSTATE (TURP);  Surgeon: Bjorn Pippin, MD;  Location: [facility];  Service: Urology;  Laterality: N/A;   VIDEO ASSISTED THORACOSCOPY (VATS)/ LOBECTOMY Right 07/04/2015   Procedure: VIDEO ASSISTED THORACOSCOPY (VATS)/ RIGHT UPPER LOBECTOMY;  Surgeon: Loreli Slot, MD;  Location: [facility] OR;  Service: Thoracic;  Laterality: Right;   VIDEO BRONCHOSCOPY N/A 06/17/2015   Procedure: VIDEO BRONCHOSCOPY;  Surgeon: Loreli Slot, MD;  Location[facility];  Service: Thoracic;  Laterality: N/A;   VIDEO BRONCHOSCOPY WITH ENDOBRONCHIAL NAVIGATION N/A 06/17/2015   Procedure: VIDEO BRONCHOSCOPY WITH ENDOBRONCHIAL NAVIGATION;  Surgeon: Loreli Slot, MD;  [facility];  Service: Thoracic;  Laterality: N/A;   VIDEO BRONCHOSCOPY WITH ENDOBRONCHIAL  ULTRASOUND N/A 06/17/2015   Procedure: VIDEO BRONCHOSCOPY WITH ENDOBRONCHIAL ULTRASOUND;  Surgeon: Loreli Slot, MD;  Location: [facility] OR;  Service: Thoracic;  Laterality: N/A;    Allergies  Allergen Reactions   Ampicillin Other (See Comments)    Don't work   Levaquin [Levofloxacin] Other (See Comments)    Doesn't work    Social History   Socioeconomic History   Marital status: Married    Spouse name: Rosine Door   Number of children: 2   Years of education: college   Highest education level: Not on file  Occupational History   Occupation: retired  Tobacco Use   Smoking status: Former    Current packs/day: 0.00    Average packs/day: 0.5 packs/day for 40.0 years (20.0 ttl pk-yrs)    Types: Cigarettes    Start date: 02/13/1975    Quit date: 02/13/2015    Years since quitting: 7.6   Smokeless tobacco: Never  Vaping Use   Vaping status: Never Used  Substance and Sexual Activity   Alcohol use: No    Alcohol/week: 0.0 standard drinks of alcohol   Drug use: No   Sexual activity: Not Currently  Other Topics Concern   Not on file  Social History Narrative   Lives with wife, daughter and son in law temporarily to help them  out   Right Handed   Drinks caffeine occassionally   Social Determinants of Health   Financial Resource Strain: Low Risk  (02/21/2022)   Overall Financial Resource Strain (CARDIA)    Difficulty of Paying Living Expenses: Not hard at all  Food Insecurity: No Food Insecurity (11/16/2021)   Hunger Vital Sign    Worried About Running Out of Food in the Last Year: Never true    Ran Out of Food in the Last Year: Never true  Transportation Needs: No Transportation Needs (02/21/2022)   PRAPARE - Administrator, Civil Service (Medical): No    Lack of Transportation (Non-Medical): No  Physical Activity: Not on file  Stress: Not on file  Social Connections: Not on file  Intimate Partner Violence: Not on file    Family History  Problem Relation Age of Onset   Cancer Father        Asbestos   Colon cancer Neg Hx    Colon polyps Neg Hx    Kidney disease Neg Hx    Esophageal cancer Neg Hx    Gallbladder disease Neg Hx    Heart disease Neg Hx    Diabetes Neg Hx       Review of Systems Constitutional: negative for anorexia, fevers and sweats  Eyes: negative for irritation, redness and visual disturbance  Ears, nose, mouth, throat, and face: negative for earaches, epistaxis, nasal congestion and sore throat  Respiratory: negative for cough, sputum and wheezing  Cardiovascular: negative for chest pain,  lower extremity edema, orthopnea, palpitations and syncope  Gastrointestinal: negative for abdominal pain, constipation, diarrhea, melena, nausea and vomiting  Genitourinary:negative for dysuria, frequency and hematuria  Hematologic/lymphatic: negative for bleeding, easy bruising and lymphadenopathy  Musculoskeletal:negative for arthralgias, muscle weakness and stiff joints  Neurological: negative for coordination problems, gait problems, headaches and weakness  Endocrine: negative for diabetic symptoms including polydipsia, polyuria and weight loss     Objective:   Physical  Exam  Gen. Pleasant, tall man, early, in no distress, normal affect ENT - no pallor,icterus, no post nasal drip, class 2-3 airway Neck: No JVD, no thyromegaly, no carotid bruits Lungs: no use  of accessory muscles, no dullness to percussion, decreased without rales or rhonchi  Cardiovascular: Rhythm regular, heart sounds  normal, no murmurs or gallops, no peripheral edema Abdomen: soft and non-tender, no hepatosplenomegaly, BS normal. Musculoskeletal: No deformities, no cyanosis or clubbing Neuro:  alert, non focal, no tremors, muscle wasting right hand        Assessment & Plan:

## 2022-10-10 NOTE — Patient Instructions (Addendum)
X schedule pFTs  X Trial of         - call me for Rx if this works  Use albuterol as needed

## 2022-10-10 NOTE — Assessment & Plan Note (Signed)
No clear reason for his shortness of breath.  He quit smoking 15 years ago. Lung function was good with COPD in 2017.  He is is evidence of right upper lobectomy and scarring in left upper lobe related to radiation. Cardiology evaluation was negative.  Echo does not show any evidence of pulmonary hypertension.  He is on Spiriva.  I gave him a sample of today.  He will report back if this helps with his breathing. If not, consider enrolling him in pulmonary rehab for deconditioning. We discussed signs and symptoms of COPD exacerbation and he will call us should this occur. We will reassess him in 3 months

## 2022-10-25 ENCOUNTER — Ambulatory Visit: Payer: TRICARE For Life (TFL)

## 2022-10-30 ENCOUNTER — Ambulatory Visit: Payer: No Typology Code available for payment source | Admitting: Nurse Practitioner

## 2022-11-09 ENCOUNTER — Telehealth: Payer: Self-pay

## 2022-11-09 NOTE — Telephone Encounter (Signed)
Patient is made aware appointment rescheduled. Patient states that he was in the hospital and missed his last PTNS appointment. Patient wants to know if he needs a sooner office visited appointment with MD he has many question and concerns that he will like for the MD to address. Patient is aware a message will be sent to Dr. Annabell Howells on earlier appointment.

## 2022-11-09 NOTE — Telephone Encounter (Signed)
Patient left a voice message 11-09-2022.  Needing a call back to speak with a nurse regarding missing his appointment.  Asking for a soon as possible call back at [phone].  Please advise.

## 2022-11-27 ENCOUNTER — Ambulatory Visit: Payer: Medicare PPO

## 2022-11-30 DIAGNOSIS — I10 Essential (primary) hypertension: Secondary | ICD-10-CM | POA: Diagnosis not present

## 2022-11-30 DIAGNOSIS — E1122 Type 2 diabetes mellitus with diabetic chronic kidney disease: Secondary | ICD-10-CM | POA: Diagnosis not present

## 2022-11-30 DIAGNOSIS — R079 Chest pain, unspecified: Secondary | ICD-10-CM | POA: Diagnosis not present

## 2022-11-30 DIAGNOSIS — R109 Unspecified abdominal pain: Secondary | ICD-10-CM | POA: Diagnosis not present

## 2022-11-30 DIAGNOSIS — Z87891 Personal history of nicotine dependence: Secondary | ICD-10-CM | POA: Diagnosis not present

## 2022-11-30 DIAGNOSIS — K449 Diaphragmatic hernia without obstruction or gangrene: Secondary | ICD-10-CM | POA: Diagnosis not present

## 2022-11-30 DIAGNOSIS — N189 Chronic kidney disease, unspecified: Secondary | ICD-10-CM | POA: Diagnosis not present

## 2022-11-30 DIAGNOSIS — J45909 Unspecified asthma, uncomplicated: Secondary | ICD-10-CM | POA: Diagnosis not present

## 2022-11-30 DIAGNOSIS — K219 Gastro-esophageal reflux disease without esophagitis: Secondary | ICD-10-CM | POA: Diagnosis not present

## 2022-12-03 ENCOUNTER — Ambulatory Visit (INDEPENDENT_AMBULATORY_CARE_PROVIDER_SITE_OTHER): Payer: Medicare PPO

## 2022-12-03 DIAGNOSIS — N393 Stress incontinence (female) (male): Secondary | ICD-10-CM | POA: Diagnosis not present

## 2022-12-03 DIAGNOSIS — M48061 Spinal stenosis, lumbar region without neurogenic claudication: Secondary | ICD-10-CM | POA: Diagnosis not present

## 2022-12-03 DIAGNOSIS — M5136 Other intervertebral disc degeneration, lumbar region: Secondary | ICD-10-CM | POA: Diagnosis not present

## 2022-12-03 DIAGNOSIS — M5135 Other intervertebral disc degeneration, thoracolumbar region: Secondary | ICD-10-CM | POA: Diagnosis not present

## 2022-12-03 DIAGNOSIS — R200 Anesthesia of skin: Secondary | ICD-10-CM | POA: Diagnosis not present

## 2022-12-03 DIAGNOSIS — M5126 Other intervertebral disc displacement, lumbar region: Secondary | ICD-10-CM | POA: Diagnosis not present

## 2022-12-03 NOTE — Progress Notes (Signed)
PTNS  Session # 38 of 45  Health & Social Factors: Diabetes, CHF, and fluid pills Caffeine: 1 cup Alcohol: none Daytime voids #per day: 10 Night-time voids #per night: 6 Urgency: yes Incontinence Episodes #per day: 6 Ankle used: left Treatment Setting: 10 Feeling/ Response: positive response Comments: N/A  Performed By: Kennyth Lose, CMA  Follow Up: keep nurse visit

## 2022-12-03 NOTE — Telephone Encounter (Signed)
Patient is going to write down some thing that he needs put in a letter that needs to go to the [place] and drop it off at his next appointment.

## 2022-12-05 DIAGNOSIS — J189 Pneumonia, unspecified organism: Secondary | ICD-10-CM | POA: Diagnosis not present

## 2022-12-05 DIAGNOSIS — R296 Repeated falls: Secondary | ICD-10-CM | POA: Diagnosis not present

## 2022-12-05 DIAGNOSIS — R42 Dizziness and giddiness: Secondary | ICD-10-CM | POA: Diagnosis not present

## 2022-12-05 DIAGNOSIS — R531 Weakness: Secondary | ICD-10-CM | POA: Diagnosis not present

## 2022-12-10 DIAGNOSIS — J189 Pneumonia, unspecified organism: Secondary | ICD-10-CM | POA: Diagnosis not present

## 2022-12-12 ENCOUNTER — Encounter: Payer: Self-pay | Admitting: Diagnostic Neuroimaging

## 2022-12-12 ENCOUNTER — Institutional Professional Consult (permissible substitution): Payer: TRICARE For Life (TFL) | Admitting: Diagnostic Neuroimaging

## 2022-12-13 ENCOUNTER — Other Ambulatory Visit: Payer: TRICARE For Life (TFL)

## 2022-12-17 ENCOUNTER — Other Ambulatory Visit (INDEPENDENT_AMBULATORY_CARE_PROVIDER_SITE_OTHER): Payer: Self-pay

## 2022-12-17 ENCOUNTER — Encounter: Payer: Self-pay | Admitting: Orthopedic Surgery

## 2022-12-17 ENCOUNTER — Ambulatory Visit (INDEPENDENT_AMBULATORY_CARE_PROVIDER_SITE_OTHER): Payer: Medicare PPO | Admitting: Orthopedic Surgery

## 2022-12-17 DIAGNOSIS — M25571 Pain in right ankle and joints of right foot: Secondary | ICD-10-CM

## 2022-12-17 DIAGNOSIS — M25372 Other instability, left ankle: Secondary | ICD-10-CM | POA: Diagnosis not present

## 2022-12-17 NOTE — Progress Notes (Signed)
Office Visit Note   Patient: Jonathan [name]           Date of Birth: 20-Aug-1940           MRN: [id number] Visit Date: 12/17/2022              Requested by: Lianne Moris, PA-C [address] [place],  [name] [address] PCP: Lianne Moris, PA-C  Chief Complaint  Patient presents with   Right Ankle - Pain      HPI: Patient is an 82 year old gentleman who is seen in follow-up.  He is status post a right total ankle arthroplasty approximately 8 years ago.  Patient states that he cannot stop falling.  He has weakness in his upper and lower extremities he states he is unable to perform his activities of daily living he is currently on nasal cannula oxygen at home.  Patient states he does have a follow-up with the [place].  He states he needs assistance for ADLs at home.  Assessment & Plan: Visit Diagnoses:  1. Pain in right ankle and joints of right foot   2. Unstable ankle, left     Plan: Patient was provided bilateral ankle stabilizing orthosis to help with his ankle instability.  He is currently wearing knee braces for his knee instability.  Patient will need further assistance at home for his ADLs.  Patient is a high risk of injury if he continues to fall at home.  Follow-Up Instructions: No follow-ups on file.   Ortho Exam  Patient is alert, oriented, no adenopathy, well-dressed, normal affect, normal respiratory effort. Examination patient has good range of motion of the ankle and subtalar joint bilaterally.  Patient currently has neoprene hinged knee sleeves for both knees.  Patient has muscle atrophy of both upper and lower extremities including the thenar muscle and his hands.  There is no effusion cellulitis or ulcers around the knees or ankle.  Patient is currently on oxycodone as needed for daily pain.  Patient's muscle weakness and neuropathy is contributing to his falls.  Imaging: XR Ankle Complete Right  Result Date: 12/17/2022 Three-view radiographs of the right ankle shows a  stable total ankle arthroplasty without loosening.  No images are attached to the encounter.  Labs: Lab Results  Component Value Date   HGBA1C 5.8 (H) 03/28/2021   HGBA1C 5.9 (H) 01/30/2019   HGBA1C 6.3 (H) 06/13/2015   ESRSEDRATE 26 01/06/2021   ESRSEDRATE 20 01/06/2020   REPTSTATUS 10/05/2019 FINAL 10/04/2019   GRAMSTAIN  06/17/2015    NO WBC SEEN NO SQUAMOUS EPITHELIAL CELLS SEEN NO ORGANISMS SEEN Performed at [facility]    CULT (A) 10/04/2019    <10,000 COLONIES/mL INSIGNIFICANT GROWTH Performed at [facility], [address] [place], [name] [address]    LABORGA MORGANELLA MORGANII (A) 09/18/2019     Lab Results  Component Value Date   ALBUMIN 3.4 (L) 12/21/2017   ALBUMIN 3.8 12/10/2017   ALBUMIN 3.6 10/25/2016    No results found for: "MG" No results found for: "VD25OH"  No results found for: "PREALBUMIN"    Latest Ref Rng & Units 04/06/2021    4:13 PM 03/31/2021    2:32 PM 03/28/2021   11:06 AM  CBC EXTENDED  WBC 4.0 - 10.5 K/uL 7.3  8.4  7.5   RBC 4.22 - 5.81 MIL/uL 3.93  4.45  4.37   Hemoglobin 13.0 - 17.0 g/dL 62.9  52.8  41.3   HCT 39.0 - 52.0 %  37.7  40.8  40.6   Platelets 150 - 400 K/uL 253  182  209   NEUT# 1.7 - 7.7 K/uL 5.1     Lymph# 0.7 - 4.0 K/uL 1.4        There is no height or weight on file to calculate BMI.  Orders:  Orders Placed This Encounter  Procedures   XR Ankle Complete Right   No orders of the defined types were placed in this encounter.    Procedures: No procedures performed  Clinical Data: No additional findings.  ROS:  All other systems negative, except as noted in the HPI. Review of Systems  Objective: Vital Signs: There were no vitals taken for this visit.  Specialty Comments:  No specialty comments available.  PMFS History: Patient Active Problem List   Diagnosis Date Noted   Sensorineural hearing loss, bilateral 10/10/2022   Mild memory disturbance 07/12/2021   Lumbar stenosis with  neurogenic claudication 03/31/2021   Gait abnormality 01/06/2020   Anemia 07/22/2019   Fecal occult blood test positive 07/22/2019   History of peptic ulcer disease    History of prostate cancer 04/10/2019   Urge incontinence 04/10/2019   Chronic UTI 01/29/2019   Generalized anxiety disorder 08/23/2018   Lymphedema, not elsewhere classified 04/21/2018   Urinary retention 04/18/2018   Hematuria 03/22/2018   Dizziness and giddiness 09/17/2017   Malignant neoplasm of prostate (HCC) 07/10/2017   Type 2 diabetes mellitus with diabetic autonomic (poly)neuropathy (HCC) 06/03/2017   Weakness 06/03/2017   Arthritis of right subtalar joint 04/18/2017   Elevated prostate specific antigen (PSA) 03/13/2017   Abdominal pain, generalized 02/11/2017   Adult body mass index 29.0-29.9 12/19/2016   Claw hand due to intrinsic minus deformity, right 05/22/2016   Idiopathic chronic venous hypertension of both lower extremities with inflammation 05/22/2016   Fibrosis of subtalar joint, right 03/08/2016   Cervical spondylosis with radiculopathy 12/05/2015   Primary malignant neoplasm of left upper lobe of lung (HCC) 09/21/2015   Lung cancer (HCC) 07/04/2015   Diabetes mellitus type II, controlled (HCC) 06/01/2015   Neoplasm of uncertain behavior of right upper lobe of lung 05/31/2015   Neoplasm of uncertain behavior of left upper lobe of lung 05/31/2015   Emphysema of lung (HCC)    Kidney stones    Osteoporosis    Lung mass 05/25/2015   H/O total ankle replacement, right 04/20/2015   Localized, primary osteoarthritis of the ankle and foot 09/09/2014   Pes planovalgus, acquired, right 09/09/2014   Carpal tunnel syndrome - right 07/09/2014   Bloating 06/14/2014   History of colonic polyps 06/14/2014   Displacement of lumbar intervertebral disc without myelopathy 03/31/2013   Lumbosacral spondylosis without myelopathy 02/09/2013   Diabetic polyneuropathy associated with type 2 diabetes mellitus (HCC)  12/05/2012   Lesion of ulnar nerve 12/05/2012   Syncope 11/15/2012   Gout 11/15/2012   Idiopathic peripheral neuropathy 09/25/2012   GERD (gastroesophageal reflux disease) 09/25/2011   Dysuria 09/25/2011   Chronic constipation 04/02/2009   CYSTITIS, RECURRENT 12/02/2008   COPD GOLD 2 s/p RULobectomy/ RT pneumonitis  10/26/2008   DIVERTICULUM, BLADDER 10/11/2008   INSOMNIA 07/24/2008   Acute cystitis without hematuria 07/20/2008   FATIGUE 07/20/2008   DEPRESSION 02/27/2008   CHEST PAIN UNSPECIFIED 02/27/2008   Neurogenic bladder 12/05/2007   NICOTINE ADDICTION 08/12/2007   DYSLIPIDEMIA 07/22/2007   OBESITY 07/22/2007   ERECTILE DYSFUNCTION 07/22/2007   HYPERTENSION 07/22/2007   Past Medical History:  Diagnosis Date   Acquired bladder diverticulum  12/ 2012  resection diverticulum done at [facility] in [place], [name]   Age-related cataract of right eye    scheduled for catarat extraction 09-12-2017   Agent orange exposure    Aneurysm of infrarenal abdominal aorta (HCC)    first dx 2017--- last CT 06-11-2017  measures 3.5cm   Anxiety    Chronic constipation    COPD with emphysema (HCC)    06--12-2017  per pt no symptoms since Feb 2019   Depression    PTSD   Diabetes mellitus type 2, diet-controlled (HCC)    Diabetic neuropathy (HCC)    Dyslipidemia    Dyspnea on exertion    Erectile dysfunction    Feeling of incomplete bladder emptying    Gait abnormality 01/06/2020   GERD (gastroesophageal reflux disease)    Gout    09-02-2017 last flare up 3 months ago   Hiatal hernia    History of atrial fibrillation    episode post op right lung lobectomy 07-04-2015   History of bladder stone    History of colon polyps    History of DVT of lower extremity    03/ 2019  bilateral lower extremity (superficial) ---  per treated w/ oral medication    History of gastritis    History of kidney stones    History of radiation therapy 09-14-2015  to 09-21-2015   left upper lung nodule --  54Gy in 3 fractions (18 Gy per fraction)   Hyperplasia of prostate with lower urinary tract symptoms (LUTS)    Hypertension    Lumbar spinal stenosis    Nephrolithiasis    Neurogenic bladder    Osteoarthritis    ankle, hands   Osteoporosis    Prostate cancer [facility]) urologist-  dr wrenn/  oncologist-  dr Kathrynn Running   dx 05-24-2017-- Stage T2b,  Gleason 4+4,  PSA 22.41,  vol 38cc--- started ADT 04/ 2019,  plan external radiation therapy   Radiation fibrosis of lung (HCC)    hx left upper long nodule SBRT 06/ 2017   Squamous cell carcinoma of both lungs (HCC) last CT in epic dated 06-26-2017 no recurrence   dx 03/ 2017  non-small cell SCC via bronchoscopy w/ bx's by dr Dorris Fetch---  s/p  right VATS w/ right lobectomy and node dissection's 07-04-2015 /  pt had SBRT to left upper nodule Stage 1 ([facility]) completed 09-21-2015   Vertigo 2018   Wears dentures    bottom   Wears glasses     Family History  Problem Relation Age of Onset   Cancer Father        Asbestos   Colon cancer Neg Hx    Colon polyps Neg Hx    Kidney disease Neg Hx    Esophageal cancer Neg Hx    Gallbladder disease Neg Hx    Heart disease Neg Hx    Diabetes Neg Hx     Past Surgical History:  Procedure Laterality Date   BIOPSY  05/29/2019   Procedure: BIOPSY;  Surgeon: Malissa Hippo, MD;  [facility];  Service: Endoscopy;;  gastric ulcer   CARPAL TUNNEL RELEASE Right 07/09/2014   Procedure: RIGHT OPEN CARPAL TUNNEL RELEASE;  Surgeon: Kathryne Hitch, MD;  Location: [facility];  Service: Orthopedics;  Laterality: Right;   CHOLECYSTECTOMY N/A 02/24/2014   Procedure: LAPAROSCOPIC CHOLECYSTECTOMY;  Surgeon: Abigail Miyamoto, MD;  Location: [facility];  Service: General;  Laterality: N/A;   COLONOSCOPY     CYSTOLITHOTOMY  11/ 2007      [facility] [place]   CYSTOSCOPY N/A 04/18/2018   Procedure: CYSTOSCOPY FLEXIBLE;  Surgeon: Bjorn Pippin, MD;  Location: [facility];  Service: Urology;   Laterality: N/A;   CYSTOSCOPY W/ LITHOLAPAXY / EHL  02-24-2007   dr Brunilda Payor  [facility]   dental implant     No teeth at this time waiting for them to be made as of 01-30-19   ESOPHAGOGASTRODUODENOSCOPY (EGD) WITH PROPOFOL N/A 05/29/2019   Procedure: ESOPHAGOGASTRODUODENOSCOPY (EGD) WITH PROPOFOL;  Surgeon: Malissa Hippo, MD;  [facility];  Service: Endoscopy;  Laterality: N/A;  925   GOLD SEED IMPLANT N/A 09/05/2017   Procedure: GOLD SEED IMPLANT;  Surgeon: Bjorn Pippin, MD;  Location: [facility];  Service: Urology;  Laterality: N/A;   INSERTION OF SUPRAPUBIC CATHETER N/A 04/18/2018   Procedure: SUPRAPUBIC TUBE CHANGE;  Surgeon: Bjorn Pippin, MD;  Location: [facility];  Service: Urology;  Laterality: N/A;   IR CATHETER TUBE CHANGE  02/24/2018   LUMBAR LAMINECTOMY/DECOMPRESSION MICRODISCECTOMY Right 03/31/2021   Procedure: Bilateral Lumbar Three- Four, Lumbar Four-Five Laminectomy, Decompression;  Surgeon: Coletta Memos, MD;  Location: [facility];  Service: Neurosurgery;  Laterality: Right;   SPACE OAR INSTILLATION N/A 09/05/2017   Procedure: SPACE OAR INSTILLATION;  Surgeon: Bjorn Pippin, MD;  Location: [facility];  Service: Urology;  Laterality: N/A;   TOTAL ANKLE ARTHROPLASTY Right 04/20/2015   Procedure: TOTAL ANKLE ARTHOPLASTY;  Surgeon: Nadara Mustard, MD;  Location: [facility];  Service: Orthopedics;  Laterality: Right;   TRANSTHORACIC ECHOCARDIOGRAM  11/16/2012   ef 55-60%,  grade 1 diastolic dysfunction/  mild dilated ascending aorta/  mild MR/ trivial TR   TRANSURETHRAL RESECTION OF PROSTATE N/A 02/03/2019   Procedure: TRANSURETHRAL RESECTION OF THE PROSTATE (TURP);  Surgeon: Bjorn Pippin, MD;  Location[facility];  Service: Urology;  Laterality: N/A;   VIDEO ASSISTED THORACOSCOPY (VATS)/ LOBECTOMY Right 07/04/2015   Procedure: VIDEO ASSISTED THORACOSCOPY (VATS)/ RIGHT UPPER LOBECTOMY;  Surgeon: Loreli Slot, MD;  Location[facility];  Service: Thoracic;  Laterality: Right;    VIDEO BRONCHOSCOPY N/A 06/17/2015   Procedure: VIDEO BRONCHOSCOPY;  Surgeon: Loreli Slot, MD;  Location: [facility];  Service: Thoracic;  Laterality: N/A;   VIDEO BRONCHOSCOPY WITH ENDOBRONCHIAL NAVIGATION N/A 06/17/2015   Procedure: VIDEO BRONCHOSCOPY WITH ENDOBRONCHIAL NAVIGATION;  Surgeon: Loreli Slot, MD;  Location[facility];  Service: Thoracic;  Laterality: N/A;   VIDEO BRONCHOSCOPY WITH ENDOBRONCHIAL ULTRASOUND N/A 06/17/2015   Procedure: VIDEO BRONCHOSCOPY WITH ENDOBRONCHIAL ULTRASOUND;  Surgeon: Loreli Slot, MD;  Location: [facility];  Service: Thoracic;  Laterality: N/A;   Social History   Occupational History   Occupation: retired  Tobacco Use   Smoking status: Former    Current packs/day: 0.00    Average packs/day: 0.5 packs/day for 40.0 years (20.0 ttl pk-yrs)    Types: Cigarettes    Start date: 02/13/1975    Quit date: 02/13/2015    Years since quitting: 7.8   Smokeless tobacco: Never  Vaping Use   Vaping status: Never Used  Substance and Sexual Activity   Alcohol use: No    Alcohol/week: 0.0 standard drinks of alcohol   Drug use: No   Sexual activity: Not Currently

## 2022-12-19 DIAGNOSIS — M19071 Primary osteoarthritis, right ankle and foot: Secondary | ICD-10-CM | POA: Diagnosis not present

## 2022-12-19 DIAGNOSIS — R4189 Other symptoms and signs involving cognitive functions and awareness: Secondary | ICD-10-CM | POA: Diagnosis not present

## 2022-12-19 DIAGNOSIS — Z85118 Personal history of other malignant neoplasm of bronchus and lung: Secondary | ICD-10-CM | POA: Diagnosis not present

## 2022-12-19 DIAGNOSIS — I10 Essential (primary) hypertension: Secondary | ICD-10-CM | POA: Diagnosis not present

## 2022-12-19 DIAGNOSIS — H8110 Benign paroxysmal vertigo, unspecified ear: Secondary | ICD-10-CM | POA: Diagnosis not present

## 2022-12-19 DIAGNOSIS — M170 Bilateral primary osteoarthritis of knee: Secondary | ICD-10-CM | POA: Diagnosis not present

## 2022-12-19 DIAGNOSIS — I700 Atherosclerosis of aorta: Secondary | ICD-10-CM | POA: Diagnosis not present

## 2022-12-19 DIAGNOSIS — E1165 Type 2 diabetes mellitus with hyperglycemia: Secondary | ICD-10-CM | POA: Diagnosis not present

## 2022-12-19 DIAGNOSIS — E7849 Other hyperlipidemia: Secondary | ICD-10-CM | POA: Diagnosis not present

## 2022-12-24 DIAGNOSIS — Z87891 Personal history of nicotine dependence: Secondary | ICD-10-CM | POA: Diagnosis not present

## 2022-12-24 DIAGNOSIS — R0902 Hypoxemia: Secondary | ICD-10-CM | POA: Diagnosis not present

## 2022-12-24 DIAGNOSIS — R1033 Periumbilical pain: Secondary | ICD-10-CM | POA: Diagnosis not present

## 2022-12-24 DIAGNOSIS — R109 Unspecified abdominal pain: Secondary | ICD-10-CM | POA: Diagnosis not present

## 2022-12-24 DIAGNOSIS — Z79899 Other long term (current) drug therapy: Secondary | ICD-10-CM | POA: Diagnosis not present

## 2022-12-24 DIAGNOSIS — Z880 Allergy status to penicillin: Secondary | ICD-10-CM | POA: Diagnosis not present

## 2022-12-24 DIAGNOSIS — Z884 Allergy status to anesthetic agent status: Secondary | ICD-10-CM | POA: Diagnosis not present

## 2022-12-24 DIAGNOSIS — N200 Calculus of kidney: Secondary | ICD-10-CM | POA: Diagnosis not present

## 2022-12-24 DIAGNOSIS — E1122 Type 2 diabetes mellitus with diabetic chronic kidney disease: Secondary | ICD-10-CM | POA: Diagnosis not present

## 2022-12-24 DIAGNOSIS — Z7984 Long term (current) use of oral hypoglycemic drugs: Secondary | ICD-10-CM | POA: Diagnosis not present

## 2022-12-24 DIAGNOSIS — R1084 Generalized abdominal pain: Secondary | ICD-10-CM | POA: Diagnosis not present

## 2022-12-24 DIAGNOSIS — N189 Chronic kidney disease, unspecified: Secondary | ICD-10-CM | POA: Diagnosis not present

## 2022-12-25 ENCOUNTER — Encounter (HOSPITAL_BASED_OUTPATIENT_CLINIC_OR_DEPARTMENT_OTHER): Payer: Medicare PPO

## 2022-12-25 ENCOUNTER — Encounter (HOSPITAL_BASED_OUTPATIENT_CLINIC_OR_DEPARTMENT_OTHER): Payer: Self-pay | Admitting: Primary Care

## 2022-12-26 ENCOUNTER — Emergency Department (HOSPITAL_COMMUNITY): Payer: Medicare PPO

## 2022-12-26 ENCOUNTER — Observation Stay (HOSPITAL_COMMUNITY)
Admission: EM | Admit: 2022-12-26 | Discharge: 2022-12-30 | Disposition: A | Discharge status: Home Health | Payer: Medicare PPO | Attending: Internal Medicine | Admitting: Internal Medicine

## 2022-12-26 ENCOUNTER — Other Ambulatory Visit: Payer: Self-pay

## 2022-12-26 ENCOUNTER — Encounter (HOSPITAL_COMMUNITY): Payer: Self-pay

## 2022-12-26 DIAGNOSIS — E876 Hypokalemia: Secondary | ICD-10-CM | POA: Insufficient documentation

## 2022-12-26 DIAGNOSIS — Z86718 Personal history of other venous thrombosis and embolism: Secondary | ICD-10-CM | POA: Insufficient documentation

## 2022-12-26 DIAGNOSIS — Z85118 Personal history of other malignant neoplasm of bronchus and lung: Secondary | ICD-10-CM | POA: Insufficient documentation

## 2022-12-26 DIAGNOSIS — M6281 Muscle weakness (generalized): Secondary | ICD-10-CM | POA: Insufficient documentation

## 2022-12-26 DIAGNOSIS — K449 Diaphragmatic hernia without obstruction or gangrene: Secondary | ICD-10-CM | POA: Diagnosis not present

## 2022-12-26 DIAGNOSIS — I129 Hypertensive chronic kidney disease with stage 1 through stage 4 chronic kidney disease, or unspecified chronic kidney disease: Secondary | ICD-10-CM | POA: Diagnosis not present

## 2022-12-26 DIAGNOSIS — Z79899 Other long term (current) drug therapy: Secondary | ICD-10-CM | POA: Insufficient documentation

## 2022-12-26 DIAGNOSIS — E1143 Type 2 diabetes mellitus with diabetic autonomic (poly)neuropathy: Secondary | ICD-10-CM | POA: Diagnosis not present

## 2022-12-26 DIAGNOSIS — J439 Emphysema, unspecified: Secondary | ICD-10-CM | POA: Diagnosis present

## 2022-12-26 DIAGNOSIS — E1122 Type 2 diabetes mellitus with diabetic chronic kidney disease: Secondary | ICD-10-CM | POA: Insufficient documentation

## 2022-12-26 DIAGNOSIS — D649 Anemia, unspecified: Secondary | ICD-10-CM | POA: Insufficient documentation

## 2022-12-26 DIAGNOSIS — I4891 Unspecified atrial fibrillation: Secondary | ICD-10-CM | POA: Diagnosis not present

## 2022-12-26 DIAGNOSIS — I714 Abdominal aortic aneurysm, without rupture, unspecified: Secondary | ICD-10-CM | POA: Diagnosis present

## 2022-12-26 DIAGNOSIS — I700 Atherosclerosis of aorta: Secondary | ICD-10-CM | POA: Diagnosis not present

## 2022-12-26 DIAGNOSIS — R2681 Unsteadiness on feet: Secondary | ICD-10-CM | POA: Insufficient documentation

## 2022-12-26 DIAGNOSIS — R413 Other amnesia: Secondary | ICD-10-CM | POA: Diagnosis not present

## 2022-12-26 DIAGNOSIS — I10 Essential (primary) hypertension: Secondary | ICD-10-CM | POA: Diagnosis present

## 2022-12-26 DIAGNOSIS — C3490 Malignant neoplasm of unspecified part of unspecified bronchus or lung: Secondary | ICD-10-CM | POA: Diagnosis present

## 2022-12-26 DIAGNOSIS — Z87891 Personal history of nicotine dependence: Secondary | ICD-10-CM | POA: Insufficient documentation

## 2022-12-26 DIAGNOSIS — I480 Paroxysmal atrial fibrillation: Secondary | ICD-10-CM | POA: Diagnosis not present

## 2022-12-26 DIAGNOSIS — M899 Disorder of bone, unspecified: Secondary | ICD-10-CM | POA: Diagnosis not present

## 2022-12-26 DIAGNOSIS — R109 Unspecified abdominal pain: Secondary | ICD-10-CM

## 2022-12-26 DIAGNOSIS — Z8546 Personal history of malignant neoplasm of prostate: Secondary | ICD-10-CM | POA: Insufficient documentation

## 2022-12-26 DIAGNOSIS — Z7901 Long term (current) use of anticoagulants: Secondary | ICD-10-CM | POA: Insufficient documentation

## 2022-12-26 DIAGNOSIS — Z9049 Acquired absence of other specified parts of digestive tract: Secondary | ICD-10-CM | POA: Diagnosis not present

## 2022-12-26 DIAGNOSIS — R1013 Epigastric pain: Secondary | ICD-10-CM | POA: Diagnosis not present

## 2022-12-26 DIAGNOSIS — J449 Chronic obstructive pulmonary disease, unspecified: Secondary | ICD-10-CM | POA: Diagnosis not present

## 2022-12-26 DIAGNOSIS — N1831 Chronic kidney disease, stage 3a: Secondary | ICD-10-CM | POA: Diagnosis present

## 2022-12-26 DIAGNOSIS — Z794 Long term (current) use of insulin: Secondary | ICD-10-CM | POA: Insufficient documentation

## 2022-12-26 DIAGNOSIS — C61 Malignant neoplasm of prostate: Secondary | ICD-10-CM | POA: Diagnosis present

## 2022-12-26 DIAGNOSIS — R1033 Periumbilical pain: Secondary | ICD-10-CM | POA: Diagnosis present

## 2022-12-26 LAB — COMPREHENSIVE METABOLIC PANEL
ALT: 14 U/L (ref 0–44)
AST: 16 U/L (ref 15–41)
Albumin: 3.1 g/dL — ABNORMAL LOW (ref 3.5–5.0)
Alkaline Phosphatase: 77 U/L (ref 38–126)
Anion gap: 7 (ref 5–15)
BUN: 14 mg/dL (ref 8–23)
CO2: 29 mmol/L (ref 22–32)
Calcium: 8.7 mg/dL — ABNORMAL LOW (ref 8.9–10.3)
Chloride: 101 mmol/L (ref 98–111)
Creatinine, Ser: 1.27 mg/dL — ABNORMAL HIGH (ref 0.61–1.24)
GFR, Estimated: 57 mL/min — ABNORMAL LOW (ref >60)
Glucose, Bld: 111 mg/dL — ABNORMAL HIGH (ref 70–99)
Potassium: 3.1 mmol/L — ABNORMAL LOW (ref 3.5–5.1)
Sodium: 137 mmol/L (ref 135–145)
Total Bilirubin: 0.7 mg/dL (ref 0.3–1.2)
Total Protein: 8 g/dL (ref 6.5–8.1)

## 2022-12-26 LAB — CBC
HCT: 37.6 % — ABNORMAL LOW (ref 39.0–52.0)
Hemoglobin: 11.7 g/dL — ABNORMAL LOW (ref 13.0–17.0)
MCH: 28.4 pg (ref 26.0–34.0)
MCHC: 31.1 g/dL (ref 30.0–36.0)
MCV: 91.3 fL (ref 80.0–100.0)
Platelets: 253 10*3/uL (ref 150–400)
RBC: 4.12 MIL/uL — ABNORMAL LOW (ref 4.22–5.81)
RDW: 14.6 % (ref 11.5–15.5)
WBC: 10.5 10*3/uL (ref 4.0–10.5)
nRBC: 0 % (ref 0.0–0.2)

## 2022-12-26 LAB — LIPASE, BLOOD: Lipase: 34 U/L (ref 11–51)

## 2022-12-26 LAB — TROPONIN I (HIGH SENSITIVITY): Troponin I (High Sensitivity): 6 ng/L (ref <18)

## 2022-12-26 MED ORDER — POTASSIUM CHLORIDE CRYS ER 20 MEQ PO TBCR
40.0000 meq | EXTENDED_RELEASE_TABLET | Freq: Once | ORAL | Status: AC
  Administered 2022-12-26: 40 meq via ORAL
  Filled 2022-12-26: qty 2

## 2022-12-26 MED ORDER — IOHEXOL 300 MG/ML  SOLN
100.0000 mL | Freq: Once | INTRAMUSCULAR | Status: AC | PRN
  Administered 2022-12-26: 100 mL via INTRAVENOUS

## 2022-12-26 MED ORDER — METOPROLOL TARTRATE 5 MG/5ML IV SOLN
5.0000 mg | Freq: Once | INTRAVENOUS | Status: AC
  Administered 2022-12-27: 5 mg via INTRAVENOUS
  Filled 2022-12-26: qty 5

## 2022-12-26 MED ORDER — FAMOTIDINE 20 MG PO TABS
20.0000 mg | ORAL_TABLET | Freq: Once | ORAL | Status: AC
  Administered 2022-12-26: 20 mg via ORAL
  Filled 2022-12-26: qty 1

## 2022-12-26 MED ORDER — ACETAMINOPHEN 500 MG PO TABS
1000.0000 mg | ORAL_TABLET | Freq: Once | ORAL | Status: AC
  Administered 2022-12-26: 1000 mg via ORAL
  Filled 2022-12-26: qty 2

## 2022-12-26 MED ORDER — POTASSIUM CHLORIDE CRYS ER 20 MEQ PO TBCR: 20.0000 meq | EXTENDED_RELEASE_TABLET | Freq: Two times a day (BID) | ORAL | 0 refills | Status: DC

## 2022-12-26 NOTE — ED Triage Notes (Signed)
Pt reports having a hiatal hernia with abdominal soreness and nausea x 2 weeks. Last BM Monday night. Denies vomiting, and fever.

## 2022-12-26 NOTE — ED Notes (Signed)
Patient transported to CT 

## 2022-12-26 NOTE — ED Notes (Signed)
Pt was given a urine cup. Pt was informed that we need a urine sample.

## 2022-12-26 NOTE — Discharge Instructions (Addendum)
You were seen today for abdominal pain.  Your lab work showed slightly low potassium, you are being discharged on a prescription for potassium.  Your CT scan did not show any clear cause for your abdominal pain today.  It is important you call your doctor or schedule close follow-up.  Your CT scan did show an enlargement of your abdominal aorta, you will need a repeat ultrasound of this at least every 3 years and should follow-up with your doctor about this.  He also have evidence of lesions in your bone which are concerning for cancer.  It is important you follow-up closely with your doctor to have this rechecked.  If you develop worsening abdominal pain, back pain, persistent vomiting or any other new concerning symptoms you should return to the ED.  Information on my medicine - ELIQUIS (apixaban)  Why was Eliquis prescribed for you? Eliquis was prescribed for you to reduce the risk of a blood clot forming that can cause a stroke if you have a medical condition called atrial fibrillation (a type of irregular heartbeat).  What do You need to know about Eliquis ? Take your Eliquis TWICE DAILY - one tablet in the morning and one tablet in the evening with or without food. If you have difficulty swallowing the tablet whole please discuss with your pharmacist how to take the medication safely.  Take Eliquis exactly as prescribed by your doctor and DO NOT stop taking Eliquis without talking to the doctor who prescribed the medication.  Stopping may increase your risk of developing a stroke.  Refill your prescription before you run out.  After discharge, you should have regular check-up appointments with your healthcare provider that is prescribing your Eliquis.  In the future your dose may need to be changed if your kidney function or weight changes by a significant amount or as you get older.  What do you do if you miss a dose? If you miss a dose, take it as soon as you remember on the same day  and resume taking twice daily.  Do not take more than one dose of ELIQUIS at the same time to make up a missed dose.  Important Safety Information A possible side effect of Eliquis is bleeding. You should call your healthcare provider right away if you experience any of the following: Bleeding from an injury or your nose that does not stop. Unusual colored urine (red or dark brown) or unusual colored stools (red or black). Unusual bruising for unknown reasons. A serious fall or if you hit your head (even if there is no bleeding).  Some medicines may interact with Eliquis and might increase your risk of bleeding or clotting while on Eliquis. To help avoid this, consult your healthcare provider or pharmacist prior to using any new prescription or non-prescription medications, including herbals, vitamins, non-steroidal anti-inflammatory drugs (NSAIDs) and supplements.  This website has more information on Eliquis (apixaban): http://www.eliquis.com/eliquis/home

## 2022-12-26 NOTE — ED Provider Notes (Signed)
[place] [facility] DEPARTMENT AT [facility] Provider Note   CSN: [id number] Arrival date & time: 12/26/22  1200     History  Chief Complaint  Patient presents with   Abdominal Pain    Jonathan [name] is a 82 y.o. male.   Abdominal Pain 82 year old male history of COPD, type 2 diabetes, hyperlipidemia, GERD, atrial fibrillation, prior prostate cancer, hypertension presenting for abdominal pain.  He states he has had pain periumbilical and epigastric for about 2 months.  Got worse today so presents for evaluation.  No nausea or vomiting.  Has not had a bowel movement a few days but does not feel like he needs to go to the bathroom.  He said decreased p.o. intake.  No difficulty urinating.  Reports history of prior hiatal hernia enlarges could be causing it.  No chest pain, no shortness of breath.  No fevers or chills.  No diarrhea.  He was seen at [facility] September 30.  He has CT scan which showed new lytic lesion in the right ilium concern for metastatic disease as well as material in the right inguinal canal.  He also had infrarenal abdominal aortic aneurysm.     Home Medications Prior to Admission medications   Medication Sig Start Date End Date Taking? Authorizing Provider  potassium chloride SA (KLOR-CON M) 20 MEQ tablet Take 1 tablet (20 mEq total) by mouth 2 (two) times daily. 12/26/22  Yes Laurence Spates, MD  albuterol (PROVENTIL) (2.5 MG/3ML) 0.083% nebulizer solution Take 3 mLs (2.5 mg total) by nebulization every 6 (six) hours as needed for shortness of breath. 04/06/21   Eber Hong, MD  atenolol-chlorthalidone (TENORETIC) 50-25 MG tablet Take 1 tablet by mouth at bedtime.  12/03/18   [provider]  atorvastatin (LIPITOR) 20 MG tablet Take 20 mg by mouth daily. 10/05/21   [provider]  capsaicin (ZOSTRIX) 0.025 % cream Apply 1 application topically daily as needed (Skin Rash). 02/09/20   [provider]  diclofenac Sodium  (VOLTAREN) 1 % GEL Apply 2 g topically 4 (four) times daily as needed (pain).    [provider]  donepezil (ARICEPT) 10 MG tablet Take 10 mg by mouth daily. 09/10/22   [provider]  doxycycline (VIBRAMYCIN) 100 MG capsule Take 1 capsule (100 mg total) by mouth every 12 (twelve) hours. 07/26/22   Bjorn Pippin, MD  escitalopram (LEXAPRO) 5 MG tablet Take 5 mg by mouth daily.    [provider]  FREESTYLE LITE test strip  09/02/19   [provider]  gabapentin (NEURONTIN) 800 MG tablet Take 800 mg by mouth daily.    [provider]  hydroquinone 4 % cream Apply 1 application topically daily as needed (Skin Rash).    [provider]  ibuprofen (ADVIL) 600 MG tablet Take 600 mg by mouth every 6 (six) hours as needed for headache. 05/26/19   [provider]  Incontinence Supply Disposable ([employer] MENS GUARD) MISC 1 Pad by Does not apply route 2 (two) times daily. 07/12/22   Bjorn Pippin, MD  Incontinence Supply Disposable (DEPEND REAL FIT/BRIEF/MEN/L-XL) MISC 1 Pad by Does not apply route in the morning and at bedtime. 07/12/22   Bjorn Pippin, MD  Lancets (FREESTYLE) lancets  09/02/19   [provider]  LANTUS SOLOSTAR 100 UNIT/ML Solostar Pen Inject 2.5 Units into the skin at bedtime. 11/28/17   [provider]  latanoprost (XALATAN) 0.005 % ophthalmic solution Place 1 drop into both eyes  at bedtime. 01/26/21   [provider]  lidocaine (LIDODERM) 5 % Place 1 patch onto the skin daily. Remove & Discard patch within 12 hours or as directed by MD 04/13/21   Caccavale, Sophia, PA-C  LINZESS 72 MCG capsule Take 72 mcg by mouth daily before breakfast. 05/21/19   [provider]  meclizine (ANTIVERT) 12.5 MG tablet Take 6.25-12.5 mg by mouth every 6 (six) hours as needed for dizziness. 02/16/21   [provider]  meloxicam (MOBIC) 7.5 MG tablet Take 1 tablet (7.5 mg total) by mouth daily. 04/13/21   Caccavale, Sophia, PA-C   methocarbamol (ROBAXIN) 500 MG tablet Take 1 tablet (500 mg total) by mouth 2 (two) times daily as needed for muscle spasms. 04/13/21   Caccavale, Sophia, PA-C  metoCLOPramide (REGLAN) 5 MG tablet Take 5 mg by mouth daily as needed for vomiting or nausea.    [provider]  mupirocin cream (BACTROBAN) 2 % Apply 1 application topically 2 (two) times daily as needed (wound care).    [provider]  mupirocin ointment (BACTROBAN) 2 % Apply 1 application topically 2 (two) times daily as needed (Rash). 09/10/19   [provider]  nystatin ointment (MYCOSTATIN) 1 application daily as needed (Skin Rash). 09/03/19   [provider]  nystatin-triamcinolone ointment (MYCOLOG) Apply 1 application topically 2 (two) times daily. To groins 09/11/19   Bjorn Pippin, MD  Oxycodone HCl 20 MG TABS Take 4 mg by mouth See admin instructions. Five times a day as needed 02/02/18   [provider]  pantoprazole (PROTONIX) 20 MG tablet Take 20 mg by mouth daily as needed for heartburn or indigestion.    [provider]  predniSONE (DELTASONE) 20 MG tablet Take 20 mg by mouth daily as needed (unknown).    [provider]  psyllium (METAMUCIL SMOOTH TEXTURE) 58.6 % powder Take 1 packet by mouth daily. 04/27/19   Rehman, Joline Maxcy, MD  RHOPRESSA 0.02 % SOLN Place 1 drop into the right eye at bedtime. 01/26/21   [provider]  ROCKLATAN 0.02-0.005 % SOLN Place 1 drop into both eyes at bedtime. 02/22/21   [provider]  scopolamine (TRANSDERM-SCOP) 1 MG/3DAYS 1 patch every 3 (three) days. 09/07/22   [provider]  SPIRIVA HANDIHALER 18 MCG inhalation capsule 18 mcg 2 (two) times daily. 03/16/20   [provider]  sucralfate (CARAFATE) 1 GM/10ML suspension Take 1 g by mouth 2 (two) times daily. 06/09/22   [provider]  tamsulosin (FLOMAX) 0.4 MG CAPS capsule Take 1 capsule (0.4 mg total) by mouth daily. 02/01/22   Bjorn Pippin, MD  terbinafine (LAMISIL) 250 MG tablet Take 250 mg by mouth 2 (two) times daily. 09/03/19   [provider]  timolol (TIMOPTIC) 0.5 % ophthalmic solution Place 1 drop into both eyes 2 (two) times daily as needed (eye pressure).    [provider]  VIAGRA 100 MG tablet Take 1 tablet (100 mg total) by mouth daily as needed for erectile dysfunction. 07/03/22   Bjorn Pippin, MD      Allergies    Ampicillin and Levaquin [levofloxacin]    Review of Systems   Review of Systems  Gastrointestinal:  Positive for abdominal pain.  Review of systems completed and notable as per HPI.  ROS otherwise negative.   Physical Exam Updated Vital Signs BP 123/76 (BP Location: Left Arm)   Pulse 93   Temp 98.1 F (36.7 C) (Oral)   Resp 13  Ht 6\' 8"  (2.032 m)   Wt 101.2 kg   SpO2 100%   BMI 24.50 kg/m  Physical Exam Vitals and nursing note reviewed.  Constitutional:      General: He is not in acute distress.    Appearance: He is well-developed.  HENT:     Head: Normocephalic and atraumatic.  Eyes:     Extraocular Movements: Extraocular movements intact.     Conjunctiva/sclera: Conjunctivae normal.     Pupils: Pupils are equal, round, and reactive to light.  Cardiovascular:     Rate and Rhythm: Normal rate and regular rhythm.     Heart sounds: No murmur heard. Pulmonary:     Effort: Pulmonary effort is normal. No respiratory distress.     Breath sounds: Normal breath sounds.  Abdominal:     General: Abdomen is flat.     Palpations: Abdomen is soft.     Tenderness: There is abdominal tenderness in the periumbilical area. There is no right CVA tenderness, left CVA tenderness, guarding or rebound.  Musculoskeletal:        General: No swelling.     Cervical back: Neck supple.  Skin:    General: Skin is warm and dry.     Capillary Refill: Capillary refill takes less than 2 seconds.  Neurological:     Mental Status: He is alert.  Psychiatric:        Mood and Affect: Mood  normal.     ED Results / Procedures / Treatments   Labs (all labs ordered are listed, but only abnormal results are displayed) Labs Reviewed  COMPREHENSIVE METABOLIC PANEL - Abnormal; Notable for the following components:      Result Value   Potassium 3.1 (*)    Glucose, Bld 111 (*)    Creatinine, Ser 1.27 (*)    Calcium 8.7 (*)    Albumin 3.1 (*)    GFR, Estimated 57 (*)    All other components within normal limits  CBC - Abnormal; Notable for the following components:   RBC 4.12 (*)    Hemoglobin 11.7 (*)    HCT 37.6 (*)    All other components within normal limits  LIPASE, BLOOD  URINALYSIS, ROUTINE W REFLEX MICROSCOPIC  TROPONIN I (HIGH SENSITIVITY)    EKG EKG Interpretation Date/Time:  Wednesday December 26 2022 23:49:25 EDT Ventricular Rate:  143 PR Interval:  150 QRS Duration:  97 QT Interval:  313 QTC Calculation: 483 R Axis:   73  Text Interpretation: Atrial fibrillation with rapid V-rate Confirmed by Fulton Reek [phone]) on 12/26/2022 11:53:05 PM  Radiology CT ABDOMEN PELVIS W CONTRAST  Result Date: 12/26/2022 CLINICAL DATA:  Acute nonlocalized abdominal pain. Hiatal hernia with abdominal soreness and nausea for 2 weeks. EXAM: CT ABDOMEN AND PELVIS WITH CONTRAST TECHNIQUE: Multidetector CT imaging of the abdomen and pelvis was performed using the standard protocol following bolus administration of intravenous contrast. RADIATION DOSE REDUCTION: This exam was performed according to the departmental dose-optimization program which includes automated exposure control, adjustment of the mA and/or kV according to patient size and/or use of iterative reconstruction technique. CONTRAST:  OMNIPAQUE IOHEXOL 300 MG/ML  SOLN COMPARISON:  12/24/2022 FINDINGS: Lower chest: Lung bases are clear. Hepatobiliary: No focal liver abnormality is seen. Status post cholecystectomy. No biliary dilatation. Pancreas: Unremarkable. No pancreatic ductal dilatation or surrounding  inflammatory changes. Spleen: 1.4 cm diameter and subcentimeter low-attenuation lesions in the spleen likely representing cysts or hemangioma. Adrenals/Urinary Tract: No adrenal gland nodules. Multiple renal cysts.  Largest on the left measures 3.6 cm diameter. No imaging follow-up is indicated. No hydronephrosis or hydroureter. Bladder is normal. Stomach/Bowel: Stomach is within normal limits. Appendix appears normal. No evidence of bowel wall thickening, distention, or inflammatory changes. Vascular/Lymphatic: Calcific and noncalcific abdominal aortic atherosclerosis. Abdominal aortic aneurysm measuring 3.9 cm diameter, unchanged. No dissection. No significant lymphadenopathy. Reproductive: Seed implants in the prostate. Other: No free air or free fluid in the abdomen. Abdominal wall musculature appears intact. Musculoskeletal: Degenerative changes in the spine and hips. Focal destructive bone lesion in the right iliac bone with multiple smaller scattered lucent lesions throughout the pelvis lesions are unchanged since prior study but suspicious for metastatic disease. IMPRESSION: 1. No evidence of bowel obstruction or inflammation. 2. 3.9 cm diameter abdominal aortic aneurysm. Recommend follow-up ultrasound every 3 years. (Ref.: J Vasc Surg. 2018; 67:2-77 and J Am Coll Radiol 2013;10(10):789-794.) 3. Aortic atherosclerosis. 4. Lucent bone lesions, most prominently a destructive bone lesion in the right iliac bone. Possible metastatic disease. Electronically Signed   By: Burman Nieves M.D.   On: 12/26/2022 22:13    Procedures Procedures    Medications Ordered in ED Medications  metoprolol tartrate (LOPRESSOR) injection 5 mg (has no administration in time range)  famotidine (PEPCID) tablet 20 mg (20 mg Oral Given 12/26/22 2147)  acetaminophen (TYLENOL) tablet 1,000 mg (1,000 mg Oral Given 12/26/22 2147)  iohexol (OMNIPAQUE) 300 MG/ML solution 100 mL (100 mLs Intravenous Contrast Given 12/26/22 2114)   potassium chloride SA (KLOR-CON M) CR tablet 40 mEq (40 mEq Oral Given 12/26/22 2337)    ED Course/ Medical Decision Making/ A&P                                 Medical Decision Making Amount and/or Complexity of Data Reviewed Labs: ordered. Radiology: ordered.  Risk OTC drugs. Prescription drug management.   Medical Decision Making:   Jonathan [name] is a 82 y.o. male who presented to the ED today with abdominal pain.  Vital signs in triage notable for mild tachycardia resolved on my examination.  He is mild tenderness of the periumbilical region where his pain is located.  No chest pain, low concern for anginal equivalent.  He has had some recent constipation which could be contribute.  Recent CT scan which showed some metastatic disease to his ileum, but no acute intra-abdominal pathology.  Lab work here so far is unremarkable, he has mild anemia but no leukocytosis.  Lipase is normal.  CMP notable for hypokalemia improved from prior, renal function at baseline.  Will plan to repeat CT scan given age and persistent symptoms.   Patient placed on continuous vitals and telemetry monitoring while in ED which was reviewed periodically.  Reviewed and confirmed nursing documentation for past medical history, family history, social history.  Reassessment and Plan:   On reassessment he is feeling slightly better.  Resting comfortably in bed.  Has not appears notable for mild hypokalemia, repletion is ordered.  He has mild anemia as well, appears stable from prior.  Lipase is normal, EKG showed some nonspecific inferior changes.  Troponin is normal and has had no chest pain, low concern for ACS.  His CT scan shows no acute abnormality but does show signs of possible metastatic disease with multiple bony lesions.  This was noted on his recent CT scan at another ED as well.  I discussed this with him, he states he was not  notified of this.  I discussed with him that it is concerning for metastatic  disease which could be indicative of underlying cancer of unclear etiology.  Could be contribute to his pain as well.  Also discussed the finding of a 3.9 cm abdominal aortic aneurysm.  No signs of rupture at this time.  I do not think this is causing his symptoms, but did discuss with him he develops severe abdominal or back pain, dizziness, fainting he needs to return to the ED due to risk of rupture.  He will need repeat ultrasound this which I discussed with him.  I discussed with him that he should call his doctor first thing tomorrow to follow-up for the AAA as well as the bony lesions concerning for metastatic disease.  Will treat his hypokalemia as well.  Urinalysis is pending.  Upon ambulating to the bathroom patient became somewhat short of breath.  He is now in A-fib RVR, rate 120s to 130s.  He has history of A-fib, although his sinus rhythm on arrival here.  No chest pain.  He does not appear to be on anticoagulation but does take atenolol for blood pressure which she did not take today.  IV metoprolol ordered.  Was given to Dr. Ala Dach at midnight with plan to follow-up heart rate and urinalysis.    Patient's presentation is most consistent with acute presentation with potential threat to life or bodily function.           Final Clinical Impression(s) / ED Diagnoses Final diagnoses:  Abdominal pain, unspecified abdominal location    Rx / DC Orders ED Discharge Orders          Ordered    potassium chloride SA (KLOR-CON M) 20 MEQ tablet  2 times daily        12/26/22 2343              Laurence Spates, MD 12/27/22 0000

## 2022-12-27 ENCOUNTER — Ambulatory Visit: Payer: Medicare PPO | Admitting: Urology

## 2022-12-27 ENCOUNTER — Other Ambulatory Visit: Payer: Self-pay | Admitting: *Deleted

## 2022-12-27 ENCOUNTER — Encounter (HOSPITAL_COMMUNITY): Payer: Self-pay | Admitting: Family Medicine

## 2022-12-27 DIAGNOSIS — R413 Other amnesia: Secondary | ICD-10-CM

## 2022-12-27 DIAGNOSIS — I4891 Unspecified atrial fibrillation: Secondary | ICD-10-CM | POA: Diagnosis not present

## 2022-12-27 DIAGNOSIS — N1831 Chronic kidney disease, stage 3a: Secondary | ICD-10-CM | POA: Diagnosis not present

## 2022-12-27 DIAGNOSIS — C61 Malignant neoplasm of prostate: Secondary | ICD-10-CM

## 2022-12-27 DIAGNOSIS — C3402 Malignant neoplasm of left main bronchus: Secondary | ICD-10-CM | POA: Diagnosis not present

## 2022-12-27 DIAGNOSIS — I714 Abdominal aortic aneurysm, without rupture, unspecified: Secondary | ICD-10-CM | POA: Diagnosis not present

## 2022-12-27 DIAGNOSIS — J439 Emphysema, unspecified: Secondary | ICD-10-CM

## 2022-12-27 DIAGNOSIS — E1143 Type 2 diabetes mellitus with diabetic autonomic (poly)neuropathy: Secondary | ICD-10-CM

## 2022-12-27 DIAGNOSIS — I10 Essential (primary) hypertension: Secondary | ICD-10-CM | POA: Diagnosis not present

## 2022-12-27 DIAGNOSIS — I480 Paroxysmal atrial fibrillation: Secondary | ICD-10-CM | POA: Diagnosis not present

## 2022-12-27 LAB — URINALYSIS, ROUTINE W REFLEX MICROSCOPIC
Bilirubin Urine: NEGATIVE
Glucose, UA: NEGATIVE mg/dL
Hgb urine dipstick: NEGATIVE
Ketones, ur: NEGATIVE mg/dL
Leukocytes,Ua: NEGATIVE
Nitrite: NEGATIVE
Protein, ur: NEGATIVE mg/dL
Specific Gravity, Urine: >1.046 — ABNORMAL HIGH (ref 1.005–1.030)
pH: 5 (ref 5.0–8.0)

## 2022-12-27 LAB — TROPONIN I (HIGH SENSITIVITY)
Troponin I (High Sensitivity): 9 ng/L (ref <18)
Troponin I (High Sensitivity): 10 ng/L (ref <18)

## 2022-12-27 LAB — CBC
HCT: 35.8 % — ABNORMAL LOW (ref 39.0–52.0)
Hemoglobin: 11.3 g/dL — ABNORMAL LOW (ref 13.0–17.0)
MCH: 28.8 pg (ref 26.0–34.0)
MCHC: 31.6 g/dL (ref 30.0–36.0)
MCV: 91.1 fL (ref 80.0–100.0)
Platelets: 238 10*3/uL (ref 150–400)
RBC: 3.93 MIL/uL — ABNORMAL LOW (ref 4.22–5.81)
RDW: 14.4 % (ref 11.5–15.5)
WBC: 9.1 10*3/uL (ref 4.0–10.5)
nRBC: 0 % (ref 0.0–0.2)

## 2022-12-27 LAB — CBG MONITORING, ED
Glucose-Capillary: 103 mg/dL — ABNORMAL HIGH (ref 70–99)
Glucose-Capillary: 102 mg/dL — ABNORMAL HIGH (ref 70–99)
Glucose-Capillary: 101 mg/dL — ABNORMAL HIGH (ref 70–99)

## 2022-12-27 LAB — GLUCOSE, CAPILLARY
Glucose-Capillary: 98 mg/dL (ref 70–99)
Glucose-Capillary: 103 mg/dL — ABNORMAL HIGH (ref 70–99)

## 2022-12-27 LAB — BASIC METABOLIC PANEL
Anion gap: 11 (ref 5–15)
BUN: 14 mg/dL (ref 8–23)
CO2: 27 mmol/L (ref 22–32)
Calcium: 8.4 mg/dL — ABNORMAL LOW (ref 8.9–10.3)
Chloride: 98 mmol/L (ref 98–111)
Creatinine, Ser: 1.13 mg/dL (ref 0.61–1.24)
GFR, Estimated: >60 mL/min (ref >60)
Glucose, Bld: 103 mg/dL — ABNORMAL HIGH (ref 70–99)
Potassium: 3.8 mmol/L (ref 3.5–5.1)
Sodium: 136 mmol/L (ref 135–145)

## 2022-12-27 LAB — HEMOGLOBIN A1C
Hgb A1c MFr Bld: 5.8 % — ABNORMAL HIGH (ref 4.8–5.6)
Mean Plasma Glucose: 119.76 mg/dL

## 2022-12-27 LAB — TSH: TSH: 0.852 u[IU]/mL (ref 0.350–4.500)

## 2022-12-27 LAB — MAGNESIUM: Magnesium: 2.1 mg/dL (ref 1.7–2.4)

## 2022-12-27 MED ORDER — PANTOPRAZOLE SODIUM 40 MG PO TBEC
40.0000 mg | DELAYED_RELEASE_TABLET | Freq: Every day | ORAL | Status: DC
  Administered 2022-12-27 – 2022-12-30 (×4): 40 mg via ORAL
  Filled 2022-12-27 (×4): qty 1

## 2022-12-27 MED ORDER — ALBUTEROL SULFATE (2.5 MG/3ML) 0.083% IN NEBU: 2.5000 mg | INHALATION_SOLUTION | Freq: Four times a day (QID) | RESPIRATORY_TRACT | Status: DC | PRN

## 2022-12-27 MED ORDER — APIXABAN 5 MG PO TABS
5.0000 mg | ORAL_TABLET | Freq: Two times a day (BID) | ORAL | Status: DC
  Administered 2022-12-27 (×3): 5 mg via ORAL
  Filled 2022-12-27 (×3): qty 1

## 2022-12-27 MED ORDER — OXYCODONE HCL 20 MG PO TABS: 20.0000 mg | ORAL_TABLET | Freq: Four times a day (QID) | ORAL | Status: DC | PRN

## 2022-12-27 MED ORDER — ACETAMINOPHEN 650 MG RE SUPP: 650.0000 mg | Freq: Four times a day (QID) | RECTAL | Status: DC | PRN

## 2022-12-27 MED ORDER — ATENOLOL 50 MG PO TABS
25.0000 mg | ORAL_TABLET | Freq: Every day | ORAL | Status: DC
  Administered 2022-12-27: 25 mg via ORAL
  Filled 2022-12-27: qty 1

## 2022-12-27 MED ORDER — INSULIN ASPART 100 UNIT/ML IJ SOLN
0.0000 [IU] | Freq: Every day | INTRAMUSCULAR | Status: DC
  Filled 2022-12-27: qty 0.05

## 2022-12-27 MED ORDER — INSULIN ASPART 100 UNIT/ML IJ SOLN
0.0000 [IU] | Freq: Three times a day (TID) | INTRAMUSCULAR | Status: DC
  Filled 2022-12-27: qty 0.06

## 2022-12-27 MED ORDER — POLYETHYLENE GLYCOL 3350 17 G PO PACK: 17.0000 g | PACK | Freq: Every day | ORAL | Status: DC | PRN

## 2022-12-27 MED ORDER — LATANOPROST 0.005 % OP SOLN
1.0000 [drp] | Freq: Every day | OPHTHALMIC | Status: DC
  Administered 2022-12-27 – 2022-12-29 (×3): 1 [drp] via OPHTHALMIC
  Filled 2022-12-27: qty 2.5

## 2022-12-27 MED ORDER — ATENOLOL-CHLORTHALIDONE 50-25 MG PO TABS: 1.0000 | ORAL_TABLET | Freq: Every day | ORAL | Status: DC

## 2022-12-27 MED ORDER — ENSURE ENLIVE PO LIQD
237.0000 mL | Freq: Two times a day (BID) | ORAL | Status: DC
  Administered 2022-12-28 – 2022-12-30 (×3): 237 mL via ORAL

## 2022-12-27 MED ORDER — ACETAMINOPHEN 325 MG PO TABS: 650.0000 mg | ORAL_TABLET | Freq: Four times a day (QID) | ORAL | Status: DC | PRN

## 2022-12-27 MED ORDER — ONDANSETRON HCL 4 MG/2ML IJ SOLN
4.0000 mg | Freq: Four times a day (QID) | INTRAMUSCULAR | Status: DC | PRN
  Administered 2022-12-27: 4 mg via INTRAVENOUS
  Filled 2022-12-27: qty 2

## 2022-12-27 MED ORDER — TIOTROPIUM BROMIDE MONOHYDRATE 18 MCG IN CAPS: 18.0000 ug | ORAL_CAPSULE | Freq: Two times a day (BID) | RESPIRATORY_TRACT | Status: DC

## 2022-12-27 MED ORDER — SUCRALFATE 1 GM/10ML PO SUSP
1.0000 g | Freq: Two times a day (BID) | ORAL | Status: DC
  Administered 2022-12-27 – 2022-12-30 (×7): 1 g via ORAL
  Filled 2022-12-27 (×7): qty 10

## 2022-12-27 MED ORDER — TIMOLOL MALEATE 0.5 % OP SOLN
1.0000 [drp] | Freq: Two times a day (BID) | OPHTHALMIC | Status: DC
  Administered 2022-12-27 – 2022-12-30 (×6): 1 [drp] via OPHTHALMIC
  Filled 2022-12-27: qty 5

## 2022-12-27 MED ORDER — DONEPEZIL HCL 5 MG PO TABS: 10.0000 mg | ORAL_TABLET | Freq: Every day | ORAL | Status: DC

## 2022-12-27 MED ORDER — METOPROLOL TARTRATE 5 MG/5ML IV SOLN: 5.0000 mg | INTRAVENOUS | Status: DC | PRN

## 2022-12-27 MED ORDER — OXYCODONE HCL 5 MG PO TABS
5.0000 mg | ORAL_TABLET | Freq: Four times a day (QID) | ORAL | Status: DC | PRN
  Administered 2022-12-27 – 2022-12-30 (×5): 5 mg via ORAL
  Filled 2022-12-27 (×5): qty 1

## 2022-12-27 MED ORDER — ONDANSETRON HCL 4 MG PO TABS: 4.0000 mg | ORAL_TABLET | Freq: Four times a day (QID) | ORAL | Status: DC | PRN

## 2022-12-27 MED ORDER — METOPROLOL TARTRATE 25 MG PO TABS
25.0000 mg | ORAL_TABLET | Freq: Two times a day (BID) | ORAL | Status: DC
  Administered 2022-12-27 – 2022-12-30 (×6): 25 mg via ORAL
  Filled 2022-12-27 (×6): qty 1

## 2022-12-27 MED ORDER — ATORVASTATIN CALCIUM 20 MG PO TABS
20.0000 mg | ORAL_TABLET | Freq: Every day | ORAL | Status: DC
  Administered 2022-12-27 – 2022-12-30 (×4): 20 mg via ORAL
  Filled 2022-12-27: qty 2
  Filled 2022-12-27 (×3): qty 1

## 2022-12-27 MED ORDER — SODIUM CHLORIDE 0.9% FLUSH
3.0000 mL | Freq: Two times a day (BID) | INTRAVENOUS | Status: DC
  Administered 2022-12-27 – 2022-12-30 (×7): 3 mL via INTRAVENOUS

## 2022-12-27 MED ORDER — UMECLIDINIUM BROMIDE 62.5 MCG/ACT IN AEPB
1.0000 | INHALATION_SPRAY | Freq: Every day | RESPIRATORY_TRACT | Status: DC
  Administered 2022-12-28 – 2022-12-30 (×3): 1 via RESPIRATORY_TRACT
  Filled 2022-12-27: qty 7

## 2022-12-27 NOTE — Consult Note (Addendum)
Cardiology Consultation   Patient ID: Jonathan [name] MRN: [id number]; DOB: 11-09-1940  Admit date: 12/26/2022 Date of Consult: 12/27/2022  PCP:  Lianne Moris, PA-C   [facility] Providers Cardiologist:  Dina Rich, MD   {  Patient Profile:   Jonathan [name] is a 82 y.o. male with a history of coronary artery calcifications noted on prior CT scan  with low risk Myoview in 08/2022, hypertension, hyperlipidemia, type 2 diabetes mellitus with peripheral neuropathy, AAA, COPD, lung cancer s/p right lung lobectomy, radiation pneumonitis, agent orange exposure, GERD, and gout who is being seen 12/27/2022 for the evaluation of new onset atrial fibrillation at the request of Dr. Marland Mcalpine.  History of Present Illness:   [name] is a 82 year old male with the above history who is followed by Dr. Dina Rich.  Last Echo in 03/2022 showed EF of 55-60% with normal wall motion and grade 1 diastolic dysfunction as well as mild dilatation of the aortic root measuring 41 mm.  He was ordered in 08/2022 for further evaluation of dyspnea on exertion and chest pain and was low risk with a large moderate intensity inferior defect with mild reversibility and significant adjacent gut radiotracer uptake that was felt to likely affect findings.  Differential included inferior infarct with mild peri-infarct ischemia versus gut related artifact but either finding still be low risk.  He was last seen by Sharlene Dory, NP, in 09/2022 at which time he reported progressive dyspnea on exertion but denied any chest pain.  Dyspnea was felt to be pulmonary in nature and he was referred to Pulmonology.  He was seen by Dr. Vassie Loll later that month.  PFTs were ordered and he was started on Spiriva.  Patient presented to the ED on 12/26/2022 for further evaluation of abdominal pain.  Upon arrival to the ED, vitals stable.  Initial EKG showed normal sinus rhytm with no acute ischemic changes. Initial  high-sensitivity troponin negative. WBC 10.5, Hgb 11.7, Plts 253. Na 137, K 3.1, Glucose 111, BUN 14, Cr 1.27. Albumin 3.1. Otherwise, LFTs normal. Abdominal/ pelvic CT showed no evidence of bowel obstruction or inflammation but did incidentally show a 3.9 cm AAA and lucent bone lesions.  While in the ED, he was noted to go into new onset atrial fibrillation with rates as high as the 150s. He was given IV Metoprolol and started on Eliquis.  Cardiology consulted for further evaluation.  At the time of this evaluation, patient is resting comfortably in no acute distress. He is a difficult historian. He had difficulty telling me what initially brought him to the hospital and had to be prompted before he could tell me. He also said it was November multiple times even after being told the correct month. Per H&P, he has a history of memory loss and is on Aricept at home. He states he has had abdominal/ epigastric pain for the last week with associated nausea but no vomiting. Pain is worse after he eats, especially after eating greasy foods. He had a cholecystectomy in 2015. He states his stools have been loose after starting a medication in a "brown bottle" but no diarrhea prior to this. He denies any chest pain. He has chronic dyspnea but states this is stable. He reports his breathing improved some after the Spiriva but also states he was started on O2. He uses 1L of O2 PRN at home. He denies any worsening shortness of breath, orthopnea, or PND. He reports some lower extremity edema at the  end of the day that improves by the morning. He denies any palpitations with his tachyarrhythmia - he just states it feels like he cannot take a good breath. He reports "staggering dizziness all the time." It was difficult to get him to elaborate on this but the more we talked the more it sounds like the dizziness occurs after he gets acutely short of breath while ambulating. He denies any syncope. No abnormal bleeding. No history  of GI or intracranial bleeding. However, he does state he falls frequently due to balance issues. He estimates that he has fallen 2-3 times in the last month alone. He denies feeling dizzy prior to any falls and assists  that this is due to balance issues. He had one "serious" falls a couple of months ago where he slipped getting out of the shower. However, he states that most of his falls are not serious because he will "catch himself."  Past Medical History:  Diagnosis Date   Acquired bladder diverticulum    12/ 2012  resection diverticulum done at [facility] in [place], [name]   Age-related cataract of right eye    scheduled for catarat extraction 09-12-2017   Agent orange exposure    Aneurysm of infrarenal abdominal aorta (HCC)    first dx 2017--- last CT 06-11-2017  measures 3.5cm   Anxiety    Chronic constipation    COPD with emphysema (HCC)    06--12-2017  per pt no symptoms since Feb 2019   Depression    PTSD   Diabetes mellitus type 2, diet-controlled (HCC)    Diabetic neuropathy (HCC)    Dyslipidemia    Dyspnea on exertion    Erectile dysfunction    Feeling of incomplete bladder emptying    Gait abnormality 01/06/2020   GERD (gastroesophageal reflux disease)    Gout    09-02-2017 last flare up 3 months ago   Hiatal hernia    History of atrial fibrillation    episode post op right lung lobectomy 07-04-2015   History of bladder stone    History of colon polyps    History of DVT of lower extremity    03/ 2019  bilateral lower extremity (superficial) ---  per treated w/ oral medication    History of gastritis    History of kidney stones    History of radiation therapy 09-14-2015  to 09-21-2015   left upper lung nodule -- 54Gy in 3 fractions (18 Gy per fraction)   Hyperplasia of prostate with lower urinary tract symptoms (LUTS)    Hypertension    Lumbar spinal stenosis    Nephrolithiasis    Neurogenic bladder    Osteoarthritis    ankle, hands   Osteoporosis    Prostate  cancer [facility]) urologist-  dr wrenn/  oncologist-  dr Kathrynn Running   dx 05-24-2017-- Stage T2b,  Gleason 4+4,  PSA 22.41,  vol 38cc--- started ADT 04/ 2019,  plan external radiation therapy   Radiation fibrosis of lung (HCC)    hx left upper long nodule SBRT 06/ 2017   Squamous cell carcinoma of both lungs (HCC) last CT in epic dated 06-26-2017 no recurrence   dx 03/ 2017  non-small cell SCC via bronchoscopy w/ bx's by dr Dorris Fetch---  s/p  right VATS w/ right lobectomy and node dissection's 07-04-2015 /  pt had SBRT to left upper nodule Stage 1 ([facility]) completed 09-21-2015   Vertigo 2018   Wears dentures    bottom   Wears glasses  Past Surgical History:  Procedure Laterality Date   BIOPSY  05/29/2019   Procedure: BIOPSY;  Surgeon: Malissa Hippo, MD;  [facility];  Service: Endoscopy;;  gastric ulcer   CARPAL TUNNEL RELEASE Right 07/09/2014   Procedure: RIGHT OPEN CARPAL TUNNEL RELEASE;  Surgeon: Kathryne Hitch, MD;  Location: [facility];  Service: Orthopedics;  Laterality: Right;   CHOLECYSTECTOMY N/A 02/24/2014   Procedure: LAPAROSCOPIC CHOLECYSTECTOMY;  Surgeon: Abigail Miyamoto, MD;  Location: [facility];  Service: General;  Laterality: N/A;   COLONOSCOPY     CYST[facility]ITHOT[facility]Y  [facility]/ [facility] [place]   CYSTOSCOPY N/A 04/18/2018   Procedure: CYSTOSCOPY FLEXIBLE;  Surgeon: Bjorn Pippin, MD;  Location: [facility];  Service: Urology;  Laterality: N/A;   CYSTOSCOPY W/ LITHOLAPAXY / EHL  02-24-2007   dr Brunilda Payor  [facility]   dental implant     No teeth at this time waiting for them to be made as of 01-30-19   ESOPHAGOGASTRODUODENOSCOPY (EGD) WITH PROPOFOL N/A 05/29/2019   Procedure: ESOPHAGOGASTRODUODENOSCOPY (EGD) WITH PROPOFOL;  Surgeon: Malissa Hippo, MD;  [facility];  Service: Endoscopy;  Laterality: N/A;  925   GOLD SEED IMPLANT N/A 09/05/2017   Procedure: GOLD SEED IMPLANT;  Surgeon: Bjorn Pippin, MD;  Location: [facility];   Service: Urology;  Laterality: N/A;   INSERTION OF SUPRAPUBIC CATHETER N/A 04/18/2018   Procedure: SUPRAPUBIC TUBE CHANGE;  Surgeon: Bjorn Pippin, MD;  Location: [facility];  Service: Urology;  Laterality: N/A;   IR CATHETER TUBE CHANGE  02/24/2018   LUMBAR LAMINECTOMY/DECOMPRESSION MICRODISCECTOMY Right 03/31/2021   Procedure: Bilateral Lumbar Three- Four, Lumbar Four-Five Laminectomy, Decompression;  Surgeon: Coletta Memos, MD;  Location: [facility];  Service: Neurosurgery;  Laterality: Right;   SPACE OAR INSTILLATION N/A 09/05/2017   Procedure: SPACE OAR INSTILLATION;  Surgeon: Bjorn Pippin, MD;  Location: [facility];  Service: Urology;  Laterality: N/A;   TOTAL ANKLE ARTHROPLASTY Right 04/20/2015   Procedure: TOTAL ANKLE ARTHOPLASTY;  Surgeon: Nadara Mustard, MD;  Location: [facility];  Service: Orthopedics;  Laterality: Right;   TRANSTHORACIC ECHOCARDIOGRAM  11/16/2012   ef 55-60%,  grade 1 diastolic dysfunction/  mild dilated ascending aorta/  mild MR/ trivial TR   TRANSURETHRAL RESECTION OF PROSTATE N/A 02/03/2019   Procedure: TRANSURETHRAL RESECTION OF THE PROSTATE (TURP);  Surgeon: Bjorn Pippin, MD;  Location: [facility];  Service: Urology;  Laterality: N/A;   VIDEO ASSISTED THORACOSCOPY (VATS)/ LOBECTOMY Right 07/04/2015   Procedure: VIDEO ASSISTED THORACOSCOPY (VATS)/ RIGHT UPPER LOBECTOMY;  Surgeon: Loreli Slot, MD;  Location[facility] OR;  Service: Thoracic;  Laterality: Right;   VIDEO BRONCHOSCOPY N/A 06/17/2015   Procedure: VIDEO BRONCHOSCOPY;  Surgeon: Loreli Slot, MD;  Location: [facility];  Service: Thoracic;  Laterality: N/A;   VIDEO BRONCHOSCOPY WITH ENDOBRONCHIAL NAVIGATION N/A 06/17/2015   Procedure: VIDEO BRONCHOSCOPY WITH ENDOBRONCHIAL NAVIGATION;  Surgeon: Loreli Slot, MD;  [facility];  Service: Thoracic;  Laterality: N/A;   VIDEO BRONCHOSCOPY WITH ENDOBRONCHIAL ULTRASOUND N/A 06/17/2015   Procedure: VIDEO BRONCHOSCOPY WITH ENDOBRONCHIAL ULTRASOUND;  Surgeon: Loreli Slot, MD;  Location[facility];  Service: Thoracic;  Laterality: N/A;     Home Medications:  Prior to Admission medications   Medication Sig Start Date End Date Taking? Authorizing Provider  albuterol (PROVENTIL) (2.5 MG/3ML) 0.083% nebulizer solution Take 3 mLs (2.5 mg total) by nebulization every 6 (six) hours as needed for shortness of breath.  04/06/21  Yes Eber Hong, MD  atenolol (TENORMIN) 25 MG tablet Take 1 tablet by mouth daily.   Yes [provider]  atorvastatin (LIPITOR) 20 MG tablet Take 20 mg by mouth daily. 10/05/21  Yes [provider]  diclofenac Sodium (VOLTAREN) 1 % GEL Apply 2 g topically 4 (four) times daily as needed (pain).   Yes [provider]  Docusate Sodium (DSS) 100 MG CAPS Take 100 mg by mouth daily.   Yes [provider]  doxycycline (MONODOX) 100 MG capsule Take 100 mg by mouth 2 (two) times daily. 11/05/22  Yes [provider]  escitalopram (LEXAPRO) 5 MG tablet Take 5 mg by mouth daily.   Yes [provider]  famotidine (PEPCID) 20 MG tablet Take by mouth. 11/30/22  Yes [provider]  gabapentin (NEURONTIN) 800 MG tablet Take 800 mg by mouth daily.   Yes [provider]  latanoprost (XALATAN) 0.005 % ophthalmic solution Place 1 drop into both eyes at bedtime. 01/26/21  Yes [provider]  LINZESS 72 MCG capsule Take 72 mcg by mouth daily before breakfast. 05/21/19  Yes [provider]  meclizine (ANTIVERT) 12.5 MG tablet Take 6.25-12.5 mg by mouth every 6 (six) hours as needed for dizziness. 02/16/21  Yes [provider]  meloxicam (MOBIC) 7.5 MG tablet Take 1 tablet (7.5 mg total) by mouth daily. 04/13/21  Yes Caccavale, Sophia, PA-C  methocarbamol (ROBAXIN) 500 MG tablet Take 1 tablet (500 mg total) by mouth 2 (two) times daily as needed for muscle spasms. 04/13/21  Yes Caccavale, Sophia, PA-C  metoCLOPramide (REGLAN) 5 MG tablet Take 5 mg by mouth daily as  needed for vomiting or nausea.   Yes [provider]  mupirocin cream (BACTROBAN) 2 % Apply 1 application topically 2 (two) times daily as needed (wound care).   Yes [provider]  Oxycodone HCl 20 MG TABS Take 5 mg by mouth See admin instructions. Takes 1/4 of a pill 4 times a day 02/02/18  Yes [provider]  pantoprazole (PROTONIX) 40 MG tablet Take 40 mg by mouth daily. 12/20/22  Yes [provider]  potassium chloride SA (KLOR-CON M) 20 MEQ tablet Take 1 tablet (20 mEq total) by mouth 2 (two) times daily. 12/26/22  Yes Laurence Spates, MD  predniSONE (DELTASONE) 20 MG tablet Take 20 mg by mouth daily as needed (unknown).   Yes [provider]  ROCKLATAN 0.02-0.005 % SOLN Place 1 drop into both eyes at bedtime. 02/22/21  Yes [provider]  scopolamine (TRANSDERM-SCOP) 1 MG/3DAYS 1 patch every 3 (three) days. 09/07/22  Yes [provider]  Simethicone 180 MG CAPS Take by mouth as needed. 12/03/22 01/02/23 Yes [provider]  SPIRIVA HANDIHALER 18 MCG inhalation capsule Place 18 mcg into inhaler and inhale as needed. 03/16/20  Yes [provider]  sucralfate (CARAFATE) 1 GM/10ML suspension Take 1 g by mouth 2 (two) times daily. 06/09/22  Yes [provider]  tamsulosin (FLOMAX) 0.4 MG CAPS capsule Take 1 capsule (0.4 mg total) by mouth daily. 02/01/22  Yes Bjorn Pippin, MD  timolol (TIMOPTIC) 0.5 % ophthalmic solution Place 1 drop into both eyes 2 (two) times daily as needed (eye pressure).   Yes [provider]  atenolol-chlorthalidone (TENORETIC) 50-25 MG tablet Take 1 tablet by mouth at bedtime.  Patient not taking: Reported on 12/27/2022 12/03/18   [provider]  capsaicin (ZOSTRIX) 0.025 % cream Apply 1 application topically daily as needed (  Skin Rash). Patient not taking: Reported on 12/27/2022 02/09/20   [provider]  donepezil (ARICEPT) 10 MG tablet Take 10 mg by mouth  daily. Patient not taking: Reported on 12/27/2022 09/10/22   [provider]  doxycycline (VIBRAMYCIN) 100 MG capsule Take 1 capsule (100 mg total) by mouth every 12 (twelve) hours. Patient not taking: Reported on 12/27/2022 07/26/22   Bjorn Pippin, MD  FREESTYLE LITE test strip  09/02/19   [provider]  hydroquinone 4 % cream Apply 1 application topically daily as needed (Skin Rash). Patient not taking: Reported on 12/27/2022    [provider]  ibuprofen (ADVIL) 600 MG tablet Take 600 mg by mouth every 6 (six) hours as needed for headache. Patient not taking: Reported on 12/27/2022 05/26/19   [provider]  Incontinence Supply Disposable ([employer] MENS GUARD) MISC 1 Pad by Does not apply route 2 (two) times daily. 07/12/22   Bjorn Pippin, MD  Incontinence Supply Disposable (DEPEND REAL FIT/BRIEF/MEN/L-XL) MISC 1 Pad by Does not apply route in the morning and at bedtime. 07/12/22   Bjorn Pippin, MD  Lancets (FREESTYLE) lancets  09/02/19   [provider]  LANTUS SOLOSTAR 100 UNIT/ML Solostar Pen Inject 2.5 Units into the skin at bedtime. Patient not taking: Reported on 12/27/2022 11/28/17   [provider]  lidocaine (LIDODERM) 5 % Place 1 patch onto the skin daily. Remove & Discard patch within 12 hours or as directed by MD Patient not taking: Reported on 12/27/2022 04/13/21   Caccavale, Sophia, PA-C  mirabegron ER (MYRBETRIQ) 25 MG TB24 tablet Take 25 mg by mouth. Patient not taking: Reported on 12/27/2022    [provider]  mupirocin ointment (BACTROBAN) 2 % Apply 1 application topically 2 (two) times daily as needed (Rash). Patient not taking: Reported on 12/27/2022 09/10/19   [provider]  nystatin ointment (MYCOSTATIN) 1 application daily as needed (Skin Rash). Patient not taking: Reported on 12/27/2022 09/03/19   [provider]  nystatin-triamcinolone ointment (MYCOLOG) Apply 1 application topically 2 (two) times daily. To  groins Patient not taking: Reported on 12/27/2022 09/11/19   Bjorn Pippin, MD  pantoprazole (PROTONIX) 20 MG tablet Take 20 mg by mouth daily as needed for heartburn or indigestion. Patient not taking: Reported on 12/27/2022    [provider]  psyllium (METAMUCIL SMOOTH TEXTURE) 58.6 % powder Take 1 packet by mouth daily. Patient not taking: Reported on 12/27/2022 04/27/19   Malissa Hippo, MD  RHOPRESSA 0.02 % SOLN Place 1 drop into the right eye at bedtime. Patient not taking: Reported on 12/27/2022 01/26/21   [provider]  terbinafine (LAMISIL) 250 MG tablet Take 250 mg by mouth 2 (two) times daily. Patient not taking: Reported on 12/27/2022 09/03/19   [provider]  VIAGRA 100 MG tablet Take 1 tablet (100 mg total) by mouth daily as needed for erectile dysfunction. Patient not taking: Reported on 12/27/2022 07/03/22   Bjorn Pippin, MD    Inpatient Medications: Scheduled Meds:  apixaban  5 mg Oral BID   atenolol  25 mg Oral Daily   atorvastatin  20 mg Oral Daily   insulin aspart  0-5 Units Subcutaneous QHS   insulin aspart  0-6 Units Subcutaneous TID WC   pantoprazole  40 mg Oral Daily   sodium chloride flush  3 mL Intravenous Q12H   sucralfate  1 g Oral BID   umeclidinium bromide  1 puff Inhalation Daily   Continuous Infusions:  PRN Meds:  acetaminophen **OR** acetaminophen, albuterol, metoprolol tartrate, ondansetron **OR** ondansetron (ZOFRAN) IV, oxyCODONE, polyethylene glycol  Allergies:    Allergies  Allergen Reactions   Ampicillin Other (See Comments)    Don't work   Levaquin [Levofloxacin] Other (See Comments)    Doesn't work    Social History:   Social History   Socioeconomic History   Marital status: Married    Spouse name: Rosine Door   Number of children: 2   Years of education: college   Highest education level: Not on file  Occupational History   Occupation: retired  Tobacco Use   Smoking status: Former    Current packs/day:  0.00    Average packs/day: 0.5 packs/day for 40.0 years (20.0 ttl pk-yrs)    Types: Cigarettes    Start date: 02/13/1975    Quit date: 02/13/2015    Years since quitting: 7.8   Smokeless tobacco: Never  Vaping Use   Vaping status: Never Used  Substance and Sexual Activity   Alcohol use: No    Alcohol/week: 0.0 standard drinks of alcohol   Drug use: No   Sexual activity: Not Currently  Other Topics Concern   Not on file  Social History Narrative   Lives with wife, daughter and son in law temporarily to help them out   Right Handed   Drinks caffeine occassionally   Social Determinants of Health   Financial Resource Strain: Low Risk  (11/05/2022)   Received from [facility]   Overall Financial Resource Strain (CARDIA)    Difficulty of Paying Living Expenses: Not hard at all  Food Insecurity: No Food Insecurity (11/05/2022)   Received from [facility]   Hunger Vital Sign    Worried About Running Out of Food in the Last Year: Never true    Ran Out of Food in the Last Year: Never true  Transportation Needs: No Transportation Needs (11/05/2022)   Received from [facility]   PRAPARE - Transportation    Lack of Transportation (Medical): No    Lack of Transportation (Non-Medical): No  Physical Activity: Insufficiently Active (11/04/2022)   Received from [facility]   Exercise Vital Sign    Days of Exercise per Week: 3 days    Minutes of Exercise per Session: 30 min  Stress: No Stress Concern Present (11/04/2022)   Received from [facility] of Occupational [facility] - Occupational Stress Questionnaire    Feeling of Stress : Only a little  Social Connections: Socially Integrated (11/04/2022)   Received from [facility]   Social Connection and Isolation Panel [NHANES]    Frequency of Communication with Friends and Family: More than three times a week    Frequency of Social Gatherings with Friends and Family: More than three times a week     Attends Religious Services: More than 4 times per year    Active Member of [employer] or Organizations: Yes    Attends Banker Meetings: 1 to 4 times per year    Marital Status: Married  Catering manager Violence: Not At Risk (11/04/2022)   Received from [facility]   Humiliation, Afraid, Rape, and Kick questionnaire    Fear of Current or Ex-Partner: No    Emotionally Abused: No    Physically Abused: No    Sexually Abused: No    Family History:   Family History  Problem Relation Age of Onset   Cancer Father  Asbestos   Colon cancer Neg Hx    Colon polyps Neg Hx    Kidney disease Neg Hx    Esophageal cancer Neg Hx    Gallbladder disease Neg Hx    Heart disease Neg Hx    Diabetes Neg Hx      ROS:  Please see the history of present illness.  All other ROS reviewed and negative.     Physical Exam/Data:   Vitals:   12/27/22 0900 12/27/22 1000 12/27/22 1023 12/27/22 1030  BP: (!) 118/91 99/64 94/70  119/68  Pulse: (!) 51 91 (!) 115 71  Resp: (!) 27 15  20   Temp:      TempSrc:      SpO2: (!) 72% (!) 88%  98%  Weight:      Height:       No intake or output data in the 24 hours ending 12/27/22 1146    12/26/2022   12:20 PM 12/26/2022   12:06 PM 10/10/2022    8:51 AM  Last 3 Weights  Weight (lbs) 223 lb 223 lb 237 lb  Weight (kg) 101.152 kg 101.152 kg 107.502 kg     Body mass index is 24.5 kg/m.  General: 82 y.o. African-American male resting comfortably in no acute distress. HEENT: Normocephalic and atraumatic. Sclera clear.  Neck: Supple. No carotid bruits. No JVD. Heart: Tachycardic with irregularly irregular rhythm.  No murmurs, gallops, or rubs. Radial pulses 2+ and equal bilaterally. Lungs: No increased work of breathing. Clear to ausculation bilaterally. No wheezes, rhonchi, or rales.  Abdomen: Soft and non-distended. Pain with palpation of right upper quadrant. Extremities: No lower extremity edema.   Skin: Warm and dry. Neuro: No focal  deficits. Mildly confused. Psych: Normal affect. Does not always respond appropriately.   EKG:  The EKG was personally reviewed and demonstrates:  Initial EKG showed normal sinus rhythm with no acute ischemic changes. Repeat EKGs showed atrial fibrillation with rates in the 130s to 140s with non-specific ST/T changes. Telemetry:  Telemetry was personally reviewed and demonstrates:  Atrial fibrillation with rates ranging from the 90s to 150s.   Relevant CV Studies:  Echocardiogram 04/04/2022: Impressions:  1. Left ventricular ejection fraction, by estimation, is 55 to 60%. The  left ventricle has normal function. The left ventricle has no regional  wall motion abnormalities. Left ventricular diastolic parameters are  consistent with Grade I diastolic  dysfunction (impaired relaxation).   2. Right ventricular systolic function is normal. The right ventricular  size is normal. Tricuspid regurgitation signal is inadequate for assessing  PA pressure.   3. The mitral valve is normal in structure. Trivial mitral valve  regurgitation. No evidence of mitral stenosis.   4. The aortic valve has an indeterminant number of cusps. There is mild  calcification of the aortic valve. There is mild thickening of the aortic  valve. Aortic valve regurgitation is not visualized. No aortic stenosis is  present.   5. Aortic dilatation noted. There is mild dilatation of the aortic root,  measuring 41 mm.   6. The inferior vena cava is normal in size with greater than 50%  respiratory variability, suggesting right atrial pressure of 3 mmHg.  _______________  Myoview 09/04/2022:   The study is low risk.   No ST deviation was noted.   There is large moderate intensity inferior defect with mild reversibility. The defect has relatively preserved wall motion. There is significant adjacent gut radiotracer uptake that likely affects findings. Inferior infact with mild peri-infarct  ischemia vs gut related artifact,  either finding would support low risk   Left ventricular function is normal. Nuclear stress EF: 63 %. The left ventricular ejection fraction is normal (55-65%). End diastolic cavity size is normal.   Laboratory Data:  High Sensitivity Troponin:   Recent Labs  Lab 12/26/22 2201 12/27/22 1030  TROPONINIHS 6 9     Chemistry Recent Labs  Lab 12/26/22 1225 12/27/22 0640  NA 137 136  K 3.1* 3.8  CL 101 98  CO2 29 27  GLUCOSE 111* 103*  BUN 14 14  CREATININE 1.27* 1.13  CALCIUM 8.7* 8.4*  MG  --  2.1  GFRNONAA 57* >60  ANIONGAP 7 11    Recent Labs  Lab 12/26/22 1225  PROT 8.0  ALBUMIN 3.1*  AST 16  ALT 14  ALKPHOS 77  BILITOT 0.7   Lipids No results for input(s): "CHOL", "TRIG", "HDL", "LABVLDL", "LDLCALC", "CHOLHDL" in the last 168 hours.  Hematology Recent Labs  Lab 12/26/22 1225 12/27/22 0640  WBC 10.5 9.1  RBC 4.12* 3.93*  HGB 11.7* 11.3*  HCT 37.6* 35.8*  MCV 91.3 91.1  MCH 28.4 28.8  MCHC 31.1 31.6  RDW 14.6 14.4  PLT 253 238   Thyroid  Recent Labs  Lab 12/27/22 0640  TSH 0.852    BNPNo results for input(s): "BNP", "PROBNP" in the last 168 hours.  DDimer No results for input(s): "DDIMER" in the last 168 hours.   Radiology/Studies:  CT ABDOMEN PELVIS W CONTRAST  Result Date: 12/26/2022 CLINICAL DATA:  Acute nonlocalized abdominal pain. Hiatal hernia with abdominal soreness and nausea for 2 weeks. EXAM: CT ABDOMEN AND PELVIS WITH CONTRAST TECHNIQUE: Multidetector CT imaging of the abdomen and pelvis was performed using the standard protocol following bolus administration of intravenous contrast. RADIATION DOSE REDUCTION: This exam was performed according to the departmental dose-optimization program which includes automated exposure control, adjustment of the mA and/or kV according to patient size and/or use of iterative reconstruction technique. CONTRAST:  OMNIPAQUE IOHEXOL 300 MG/ML  SOLN COMPARISON:  12/24/2022 FINDINGS: Lower chest: Lung  bases are clear. Hepatobiliary: No focal liver abnormality is seen. Status post cholecystectomy. No biliary dilatation. Pancreas: Unremarkable. No pancreatic ductal dilatation or surrounding inflammatory changes. Spleen: 1.4 cm diameter and subcentimeter low-attenuation lesions in the spleen likely representing cysts or hemangioma. Adrenals/Urinary Tract: No adrenal gland nodules. Multiple renal cysts. Largest on the left measures 3.6 cm diameter. No imaging follow-up is indicated. No hydronephrosis or hydroureter. Bladder is normal. Stomach/Bowel: Stomach is within normal limits. Appendix appears normal. No evidence of bowel wall thickening, distention, or inflammatory changes. Vascular/Lymphatic: Calcific and noncalcific abdominal aortic atherosclerosis. Abdominal aortic aneurysm measuring 3.9 cm diameter, unchanged. No dissection. No significant lymphadenopathy. Reproductive: Seed implants in the prostate. Other: No free air or free fluid in the abdomen. Abdominal wall musculature appears intact. Musculoskeletal: Degenerative changes in the spine and hips. Focal destructive bone lesion in the right iliac bone with multiple smaller scattered lucent lesions throughout the pelvis lesions are unchanged since prior study but suspicious for metastatic disease. IMPRESSION: 1. No evidence of bowel obstruction or inflammation. 2. 3.9 cm diameter abdominal aortic aneurysm. Recommend follow-up ultrasound every 3 years. (Ref.: J Vasc Surg. 2018; 67:2-77 and J Am Coll Radiol 2013;10(10):789-794.) 3. Aortic atherosclerosis. 4. Lucent bone lesions, most prominently a destructive bone lesion in the right iliac bone. Possible metastatic disease. Electronically Signed   By: Burman Nieves M.D.   On: 12/26/2022 22:13     Assessment  and Plan:    New Onset Atrial Fibrillation Patient presented with abdominal/ epigastric pain and nausea and while in the ED went into new onset rapid atrial fibrillation with rates as high as  the 150s. Echo in 03/2022 showed normal LV function. Potassium slightly low on admission (repleted) but Magnesium and TSH normal. He was given a dose of IV Lopressor and then restarted on home Atenolol.  - Rates were ranging from the 90s to 150s while I was in the room with him. Currently in the 130s. - Repeat Echo pending. - BP soft with systolic BP in the 90s at times so I don't think he will tolerate IV Diltiazem drip. Will discuss use of IV Amiodarone with MD - hesitant to do this given history of COPD and RT pneumonitis. Don't think Amiodarone is a good long-term option. - Will stop Atenolol and start Lopressor 25mg  twice daily for now. May need to increase to every 6 hours. Will discuss with MD.  - CHA2DS2-VASc = 5 (HTN, DM, coronary calcifications, age x2). He has already been started on Eliquis 5mg  twice daily. He does have history of frequent falls due to balance issues (has underlying peripheral neuropathy). However, he states most of his falls are not serious because he is able to catch himself. I did discuss the risk of serious bleeding with falls while on anticoagulation. OK to leave him on Eliquis for now but we may need to rediscuss this in the future. Will review with MD.  Hypertension BP soft but stable. - Will stop Atenolol and start Lopressor as above for better rate control.  - PTA medication include Chlorthalidone as well (combination Atenolol-Chlorthalidone pill). Agree with holding Chlorthalidone.   AAA CT on admission showed 3.9 cm AAA.  - Can continue to monitor as an outpatient.   Otherwise, per primary team: - Abdominal/ epigastric pain with nausea - COPD: on 1 L of O2 PRN at home. - History of lung cancer with RT pneumonitis - Hyperlipidemia - Type 2 diabetes with peripheral neuropathy  - Memory loss: on Aricept     Risk Assessment/Risk Scores:    CHA2DS2-VASc Score = 5  This indicates a 7.2% annual risk of stroke. The patient's score is based upon: CHF  History: 0 HTN History: 1 Diabetes History: 1 Stroke History: 0 Vascular Disease History: 1 Age Score: 2 Gender Score: 0     For questions or updates, please contact [facility] Please consult [email or web address] for contact info under    Signed, Corrin Parker, PA-C  12/27/2022 11:46 AM  History and all data above reviewed.  Patient examined.  I agree with the findings as above.   Patient has a history of some diastolic dysfunction.  There is a mention previously of an aortic aneurysm.  He had an abnormal stress test as mentioned above and a possible old infarct that was managed medically.  He is not complaining of any cardiac complaints.  I do see a mention previously of some paroxysmal atrial fibrillation but he does not know of this.  He has not had any palpitations, presyncope or syncope.  He has not had any chest pressure, neck or arm discomfort.  He was brought in because he was having such severe discomfort every time he ate.  The patient has some memory issues but does report the abdominal discomfort and denies any acute cardiac symptoms.  He is not incidentally to be in atrial fibrillation.  Of note his CT does confirm a 3.9 cm  abdominal aortic aneurysm.  He has some aortic atherosclerosis.  There are some lucent bone lesions in the right iliac bone with questionable metastatic disease.  The patient exam reveals COR: Irregular rate and rhythm, no murmurs,  Lungs: Clear to auscultation bilaterally,  Abd: Positive bowel sounds normal frequency in pitch, Ext 2+ pulses, no edema.  All available labs, radiology testing, previous records reviewed. Agree with documented assessment and plan.  Atrial fibrillation: No clear onset can be pinpointed.  The patient was in sinus on his last EKG in July.  He does not really feel his fibrillation.  He is already been started on Eliquis.  We might have to hold off on this pending any invasive evaluation if 1 is indicated during this admission.  We  will try for rate control although his blood pressure is somewhat low.  He is got some tacky bradycardia rates.  I did talk to his wife and he does not have a lot of falls and would be a candidate for long-term anticoagulation pending the workup of his abdominal complaints.  For now I am not planning cardioversion as he would need a TEE and hopefully will be able to rate control.  Fayrene Fearing Mattye Verdone  2:10 PM  12/27/2022

## 2022-12-27 NOTE — H&P (Addendum)
History and Physical    Jonathan [name] FAO:[id number] DOB: 05-22-1940 DOA: 12/26/2022  PCP: Lianne Moris, PA-C   Patient coming from: Home   Chief Complaint: Abdominal pain, generalized weakness, loss of appetite   HPI: Jonathan [name] is a 82 y.o. male with medical history significant for prostate cancer status post EXRT and ADT, cancer of the right lung status post lobectomy and left upper lobe nodule status post SBR in 2017, CKD 3A, hypertension, COPD, memory loss, and an episode of rapid atrial fibrillation following right upper lobectomy in 2017 who now presents with abdominal discomfort and loss of appetite.  Patient presents with multiple vague complaints including abdominal discomfort and generalized weakness.  Family also notes that he has not been eating or drinking much recently.  Abdominal discomfort has been present for weeks to months, intermittent, associated with nausea but no vomiting or diarrhea, fevers or chills.  Patient denies any chest pain or lightheadedness.  He denies leg swelling.  ED Course: Upon arrival to the ED, patient is found to be afebrile and saturating well on room air with normal heart rate initially.  Patient later went into rapid atrial fibrillation with rate in the 140s in the ED.  Labs are most notable for potassium 3.1, creatinine 1.27, normal troponin, and normal WBC.  CT of the abdomen pelvis demonstrates 3.9 cm AAA and lucent bone lesions.  Patient was treated with 40 mEq oral potassium, Pepcid, acetaminophen, Eliquis, and IV Lopressor in the ED.  Review of Systems:  All other systems reviewed and apart from HPI, are negative.  Past Medical History:  Diagnosis Date   Acquired bladder diverticulum    12/ 2012  resection diverticulum done at [facility] in [place], [name]   Age-related cataract of right eye    scheduled for catarat extraction 09-12-2017   Agent orange exposure    Aneurysm of infrarenal abdominal aorta (HCC)    first dx 2017--- last  CT 06-11-2017  measures 3.5cm   Anxiety    Chronic constipation    COPD with emphysema (HCC)    06--12-2017  per pt no symptoms since Feb 2019   Depression    PTSD   Diabetes mellitus type 2, diet-controlled (HCC)    Diabetic neuropathy (HCC)    Dyslipidemia    Dyspnea on exertion    Erectile dysfunction    Feeling of incomplete bladder emptying    Gait abnormality 01/06/2020   GERD (gastroesophageal reflux disease)    Gout    09-02-2017 last flare up 3 months ago   Hiatal hernia    History of atrial fibrillation    episode post op right lung lobectomy 07-04-2015   History of bladder stone    History of colon polyps    History of DVT of lower extremity    03/ 2019  bilateral lower extremity (superficial) ---  per treated w/ oral medication    History of gastritis    History of kidney stones    History of radiation therapy 09-14-2015  to 09-21-2015   left upper lung nodule -- 54Gy in 3 fractions (18 Gy per fraction)   Hyperplasia of prostate with lower urinary tract symptoms (LUTS)    Hypertension    Lumbar spinal stenosis    Nephrolithiasis    Neurogenic bladder    Osteoarthritis    ankle, hands   Osteoporosis    Prostate cancer [facility]) urologist-  dr wrenn/  oncologist-  dr Kathrynn Running   dx 05-24-2017-- Stage T2b,  Gleason  4+4,  PSA 22.41,  vol 38cc--- started ADT 04/ 2019,  plan external radiation therapy   Radiation fibrosis of lung (HCC)    hx left upper long nodule SBRT 06/ 2017   Squamous cell carcinoma of both lungs (HCC) last CT in epic dated 06-26-2017 no recurrence   dx 03/ 2017  non-small cell SCC via bronchoscopy w/ bx's by dr Dorris Fetch---  s/p  right VATS w/ right lobectomy and node dissection's 07-04-2015 /  pt had SBRT to left upper nodule Stage 1 ([facility]) completed 09-21-2015   Vertigo 2018   Wears dentures    bottom   Wears glasses     Past Surgical History:  Procedure Laterality Date   BIOPSY  05/29/2019   Procedure: BIOPSY;  Surgeon: Malissa Hippo, MD;  [facility];  Service: Endoscopy;;  gastric ulcer   CARPAL TUNNEL RELEASE Right 07/09/2014   Procedure: RIGHT OPEN CARPAL TUNNEL RELEASE;  Surgeon: Kathryne Hitch, MD;  Location: [facility];  Service: Orthopedics;  Laterality: Right;   CHOLECYSTECTOMY N/A 02/24/2014   Procedure: LAPAROSCOPIC CHOLECYSTECTOMY;  Surgeon: Abigail Miyamoto, MD;  Location: [facility];  Service: General;  Laterality: N/A;   COLONOSCOPY     CYST[facility]ITHOT[facility]  [facility] [place]   CYSTOSCOPY N/A 04/18/2018   Procedure: CYSTOSCOPY FLEXIBLE;  Surgeon: Bjorn Pippin, MD;  Location: [facility];  Service: Urology;  Laterality: N/A;   CYSTOSCOPY W/ LITHOLAPAXY / EHL  02-24-2007   dr Brunilda Payor  [facility]   dental implant     No teeth at this time waiting for them to be made as of 01-30-19   ESOPHAGOGASTRODUODENOSCOPY (EGD) WITH PROPOFOL N/A 05/29/2019   Procedure: ESOPHAGOGASTRODUODENOSCOPY (EGD) WITH PROPOFOL;  Surgeon: Malissa Hippo, MD;  [facility];  Service: Endoscopy;  Laterality: N/A;  925   GOLD SEED IMPLANT N/A 09/05/2017   Procedure: GOLD SEED IMPLANT;  Surgeon: Bjorn Pippin, MD;  Location: [facility];  Service: Urology;  Laterality: N/A;   INSERTION OF SUPRAPUBIC CATHETER N/A 04/18/2018   Procedure: SUPRAPUBIC TUBE CHANGE;  Surgeon: Bjorn Pippin, MD;  Location: [facility];  Service: Urology;  Laterality: N/A;   IR CATHETER TUBE CHANGE  02/24/2018   LUMBAR LAMINECTOMY/DECOMPRESSION MICRODISCECTOMY Right 03/31/2021   Procedure: Bilateral Lumbar Three- Four, Lumbar Four-Five Laminectomy, Decompression;  Surgeon: Coletta Memos, MD;  Location: [facility];  Service: Neurosurgery;  Laterality: Right;   SPACE OAR INSTILLATION N/A 09/05/2017   Procedure: SPACE OAR INSTILLATION;  Surgeon: Bjorn Pippin, MD;  Location: [facility];  Service: Urology;  Laterality: N/A;   TOTAL ANKLE ARTHROPLASTY Right 04/20/2015   Procedure: TOTAL ANKLE ARTHOPLASTY;  Surgeon: Nadara Mustard, MD;  Location: [facility];  Service: Orthopedics;  Laterality: Right;   TRANSTHORACIC ECHOCARDIOGRAM  11/16/2012   ef 55-60%,  grade 1 diastolic dysfunction/  mild dilated ascending aorta/  mild MR/ trivial TR   TRANSURETHRAL RESECTION OF PROSTATE N/A 02/03/2019   Procedure: TRANSURETHRAL RESECTION OF THE PROSTATE (TURP);  Surgeon: Bjorn Pippin, MD;  Location: [facility];  Service: Urology;  Laterality: N/A;   VIDEO ASSISTED THORACOSCOPY (VATS)/ LOBECTOMY Right 07/04/2015   Procedure: VIDEO ASSISTED THORACOSCOPY (VATS)/ RIGHT UPPER LOBECTOMY;  Surgeon: Loreli Slot, MD;  Location: [facility];  Service: Thoracic;  Laterality: Right;   VIDEO BRONCHOSCOPY N/A 06/17/2015   Procedure: VIDEO BRONCHOSCOPY;  Surgeon: Loreli Slot, MD;  Location: [facility] OR;  Service: Thoracic;  Laterality:  N/A;   VIDEO BRONCHOSCOPY WITH ENDOBRONCHIAL NAVIGATION N/A 06/17/2015   Procedure: VIDEO BRONCHOSCOPY WITH ENDOBRONCHIAL NAVIGATION;  Surgeon: Loreli Slot, MD;  Location: [facility] OR;  Service: Thoracic;  Laterality: N/A;   VIDEO BRONCHOSCOPY WITH ENDOBRONCHIAL ULTRASOUND N/A 06/17/2015   Procedure: VIDEO BRONCHOSCOPY WITH ENDOBRONCHIAL ULTRASOUND;  Surgeon: Loreli Slot, MD;  Location[facility];  Service: Thoracic;  Laterality: N/A;    Social History:   reports that he quit smoking about 7 years ago. His smoking use included cigarettes. He started smoking about 47 years ago. He has a 20 pack-year smoking history. He has never used smokeless tobacco. He reports that he does not drink alcohol and does not use drugs.  Allergies  Allergen Reactions   Ampicillin Other (See Comments)    Don't work   Levaquin [Levofloxacin] Other (See Comments)    Doesn't work    Family History  Problem Relation Age of Onset   Cancer Father        Asbestos   Colon cancer Neg Hx    Colon polyps Neg Hx    Kidney disease Neg Hx    Esophageal cancer Neg Hx    Gallbladder disease Neg Hx    Heart disease Neg Hx    Diabetes  Neg Hx      Prior to Admission medications   Medication Sig Start Date End Date Taking? Authorizing Provider  potassium chloride SA (KLOR-CON M) 20 MEQ tablet Take 1 tablet (20 mEq total) by mouth 2 (two) times daily. 12/26/22  Yes Laurence Spates, MD  albuterol (PROVENTIL) (2.5 MG/3ML) 0.083% nebulizer solution Take 3 mLs (2.5 mg total) by nebulization every 6 (six) hours as needed for shortness of breath. 04/06/21   Eber Hong, MD  atenolol-chlorthalidone (TENORETIC) 50-25 MG tablet Take 1 tablet by mouth at bedtime.  12/03/18   [provider]  atorvastatin (LIPITOR) 20 MG tablet Take 20 mg by mouth daily. 10/05/21   [provider]  capsaicin (ZOSTRIX) 0.025 % cream Apply 1 application topically daily as needed (Skin Rash). 02/09/20   [provider]  diclofenac Sodium (VOLTAREN) 1 % GEL Apply 2 g topically 4 (four) times daily as needed (pain).    [provider]  donepezil (ARICEPT) 10 MG tablet Take 10 mg by mouth daily. 09/10/22   [provider]  doxycycline (VIBRAMYCIN) 100 MG capsule Take 1 capsule (100 mg total) by mouth every 12 (twelve) hours. 07/26/22   Bjorn Pippin, MD  escitalopram (LEXAPRO) 5 MG tablet Take 5 mg by mouth daily.    [provider]  FREESTYLE LITE test strip  09/02/19   [provider]  gabapentin (NEURONTIN) 800 MG tablet Take 800 mg by mouth daily.    [provider]  hydroquinone 4 % cream Apply 1 application topically daily as needed (Skin Rash).    [provider]  ibuprofen (ADVIL) 600 MG tablet Take 600 mg by mouth every 6 (six) hours as needed for headache. 05/26/19   [provider]  Incontinence Supply Disposable ([employer] MENS GUARD) MISC 1 Pad by Does not apply route 2 (two) times daily. 07/12/22   Bjorn Pippin, MD  Incontinence Supply Disposable (DEPEND REAL FIT/BRIEF/MEN/L-XL) MISC 1 Pad by Does not apply route in the morning and at bedtime. 07/12/22   Bjorn Pippin, MD  Lancets  (FREESTYLE) lancets  09/02/19   [provider]  LANTUS SOLOSTAR 100 UNIT/ML Solostar Pen Inject 2.5 Units into the skin at bedtime. 11/28/17  [provider]  latanoprost (XALATAN) 0.005 % ophthalmic solution Place 1 drop into both eyes at bedtime. 01/26/21   [provider]  lidocaine (LIDODERM) 5 % Place 1 patch onto the skin daily. Remove & Discard patch within 12 hours or as directed by MD 04/13/21   Caccavale, Sophia, PA-C  LINZESS 72 MCG capsule Take 72 mcg by mouth daily before breakfast. 05/21/19   [provider]  meclizine (ANTIVERT) 12.5 MG tablet Take 6.25-12.5 mg by mouth every 6 (six) hours as needed for dizziness. 02/16/21   [provider]  meloxicam (MOBIC) 7.5 MG tablet Take 1 tablet (7.5 mg total) by mouth daily. 04/13/21   Caccavale, Sophia, PA-C  methocarbamol (ROBAXIN) 500 MG tablet Take 1 tablet (500 mg total) by mouth 2 (two) times daily as needed for muscle spasms. 04/13/21   Caccavale, Sophia, PA-C  metoCLOPramide (REGLAN) 5 MG tablet Take 5 mg by mouth daily as needed for vomiting or nausea.    [provider]  mupirocin cream (BACTROBAN) 2 % Apply 1 application topically 2 (two) times daily as needed (wound care).    [provider]  mupirocin ointment (BACTROBAN) 2 % Apply 1 application topically 2 (two) times daily as needed (Rash). 09/10/19   [provider]  nystatin ointment (MYCOSTATIN) 1 application daily as needed (Skin Rash). 09/03/19   [provider]  nystatin-triamcinolone ointment (MYCOLOG) Apply 1 application topically 2 (two) times daily. To groins 09/11/19   Bjorn Pippin, MD  Oxycodone HCl 20 MG TABS Take 4 mg by mouth See admin instructions. Five times a day as needed 02/02/18   [provider]  pantoprazole (PROTONIX) 20 MG tablet Take 20 mg by mouth daily as needed for heartburn or indigestion.    [provider]  predniSONE (DELTASONE) 20 MG tablet Take 20 mg by mouth  daily as needed (unknown).    [provider]  psyllium (METAMUCIL SMOOTH TEXTURE) 58.6 % powder Take 1 packet by mouth daily. 04/27/19   Rehman, Joline Maxcy, MD  RHOPRESSA 0.02 % SOLN Place 1 drop into the right eye at bedtime. 01/26/21   [provider]  ROCKLATAN 0.02-0.005 % SOLN Place 1 drop into both eyes at bedtime. 02/22/21   [provider]  scopolamine (TRANSDERM-SCOP) 1 MG/3DAYS 1 patch every 3 (three) days. 09/07/22   [provider]  SPIRIVA HANDIHALER 18 MCG inhalation capsule 18 mcg 2 (two) times daily. 03/16/20   [provider]  sucralfate (CARAFATE) 1 GM/10ML suspension Take 1 g by mouth 2 (two) times daily. 06/09/22   [provider]  tamsulosin (FLOMAX) 0.4 MG CAPS capsule Take 1 capsule (0.4 mg total) by mouth daily. 02/01/22   Bjorn Pippin, MD  terbinafine (LAMISIL) 250 MG tablet Take 250 mg by mouth 2 (two) times daily. 09/03/19   [provider]  timolol (TIMOPTIC) 0.5 % ophthalmic solution Place 1 drop into both eyes 2 (two) times daily as needed (eye pressure).    [provider]  VIAGRA 100 MG tablet Take 1 tablet (100 mg total) by mouth daily as needed for erectile dysfunction. 07/03/22   Bjorn Pippin, MD    Physical Exam: Vitals:   12/26/22 1611 12/26/22 1924 12/26/22 2034 12/27/22 0030  BP:   123/76 96/61  Pulse:  (!) 102 93 80  Resp:   13 15  Temp: 98.5 F (36.9 C)  98.1 F (36.7 C) 98.1 F (36.7 C)  TempSrc:   Oral Oral  SpO2:  100%  100% 94%  Weight:      Height:        Constitutional: NAD, no pallor or diaphoresis  Eyes: PERTLA, lids and conjunctivae normal ENMT: Mucous membranes are moist. Posterior pharynx clear of any exudate or lesions.   Neck: supple, no masses  Respiratory: no wheezing, no crackles. No accessory muscle use.  Cardiovascular: S1 & S2 heard, regular rate and rhythm. No extremity edema.   Abdomen: No distension, no tenderness, soft. Bowel sounds active.  Musculoskeletal:  no clubbing / cyanosis. No joint deformity upper and lower extremities.   Skin: no significant rashes, lesions, ulcers. Warm, dry, well-perfused. Neurologic: CN 2-12 grossly intact. Moving all extremities. Alert and oriented to person and place.  Psychiatric: Calm. Cooperative.    Labs and Imaging on Admission: I have personally reviewed following labs and imaging studies  CBC: Recent Labs  Lab 12/26/22 1225  WBC 10.5  HGB 11.7*  HCT 37.6*  MCV 91.3  PLT 253   Basic Metabolic Panel: Recent Labs  Lab 12/26/22 1225  NA 137  K 3.1*  CL 101  CO2 29  GLUCOSE 111*  BUN 14  CREATININE 1.27*  CALCIUM 8.7*   GFR: Estimated Creatinine Clearance: 63 mL/min (A) (by C-G formula based on SCr of 1.27 mg/dL (H)). Liver Function Tests: Recent Labs  Lab 12/26/22 1225  AST 16  ALT 14  ALKPHOS 77  BILITOT 0.7  PROT 8.0  ALBUMIN 3.1*   Recent Labs  Lab 12/26/22 1225  LIPASE 34   No results for input(s): "AMMONIA" in the last 168 hours. Coagulation Profile: No results for input(s): "INR", "PROTIME" in the last 168 hours. Cardiac Enzymes: No results for input(s): "CKTOTAL", "CKMB", "CKMBINDEX", "TROPONINI" in the last 168 hours. BNP (last 3 results) No results for input(s): "PROBNP" in the last 8760 hours. HbA1C: No results for input(s): "HGBA1C" in the last 72 hours. CBG: No results for input(s): "GLUCAP" in the last 168 hours. Lipid Profile: No results for input(s): "CHOL", "HDL", "LDLCALC", "TRIG", "CHOLHDL", "LDLDIRECT" in the last 72 hours. Thyroid Function Tests: No results for input(s): "TSH", "T4TOTAL", "FREET4", "T3FREE", "THYROIDAB" in the last 72 hours. Anemia Panel: No results for input(s): "VITAMINB12", "FOLATE", "FERRITIN", "TIBC", "IRON", "RETICCTPCT" in the last 72 hours. Urine analysis:    Component Value Date/Time   COLORURINE YELLOW 12/26/2022 2345   APPEARANCEUR CLEAR 12/26/2022 2345   APPEARANCEUR Clear 07/12/2022 1135   LABSPEC >1.046 (H)  12/26/2022 2345   PHURINE 5.0 12/26/2022 2345   GLUCOSEU NEGATIVE 12/26/2022 2345   HGBUR NEGATIVE 12/26/2022 2345   BILIRUBINUR NEGATIVE 12/26/2022 2345   BILIRUBINUR Negative 07/12/2022 1135   KETONESUR NEGATIVE 12/26/2022 2345   PROTEINUR NEGATIVE 12/26/2022 2345   UROBILINOGEN negative (A) 09/11/2019 1107   UROBILINOGEN 1.0 10/04/2014 2342   NITRITE NEGATIVE 12/26/2022 2345   LEUKOCYTESUR NEGATIVE 12/26/2022 2345   Sepsis Labs: @LABRCNTIP (procalcitonin:4,lacticidven:4) )No results found for this or any previous visit (from the past 240 hour(s)).   Radiological Exams on Admission: CT ABDOMEN PELVIS W CONTRAST  Result Date: 12/26/2022 CLINICAL DATA:  Acute nonlocalized abdominal pain. Hiatal hernia with abdominal soreness and nausea for 2 weeks. EXAM: CT ABDOMEN AND PELVIS WITH CONTRAST TECHNIQUE: Multidetector CT imaging of the abdomen and pelvis was performed using the standard protocol following bolus administration of intravenous contrast. RADIATION DOSE REDUCTION: This exam was performed according to the departmental dose-optimization program which includes automated exposure control, adjustment of the mA and/or kV according to patient size and/or use of iterative reconstruction technique.  CONTRAST:  OMNIPAQUE IOHEXOL 300 MG/ML  SOLN COMPARISON:  12/24/2022 FINDINGS: Lower chest: Lung bases are clear. Hepatobiliary: No focal liver abnormality is seen. Status post cholecystectomy. No biliary dilatation. Pancreas: Unremarkable. No pancreatic ductal dilatation or surrounding inflammatory changes. Spleen: 1.4 cm diameter and subcentimeter low-attenuation lesions in the spleen likely representing cysts or hemangioma. Adrenals/Urinary Tract: No adrenal gland nodules. Multiple renal cysts. Largest on the left measures 3.6 cm diameter. No imaging follow-up is indicated. No hydronephrosis or hydroureter. Bladder is normal. Stomach/Bowel: Stomach is within normal limits. Appendix appears  normal. No evidence of bowel wall thickening, distention, or inflammatory changes. Vascular/Lymphatic: Calcific and noncalcific abdominal aortic atherosclerosis. Abdominal aortic aneurysm measuring 3.9 cm diameter, unchanged. No dissection. No significant lymphadenopathy. Reproductive: Seed implants in the prostate. Other: No free air or free fluid in the abdomen. Abdominal wall musculature appears intact. Musculoskeletal: Degenerative changes in the spine and hips. Focal destructive bone lesion in the right iliac bone with multiple smaller scattered lucent lesions throughout the pelvis lesions are unchanged since prior study but suspicious for metastatic disease. IMPRESSION: 1. No evidence of bowel obstruction or inflammation. 2. 3.9 cm diameter abdominal aortic aneurysm. Recommend follow-up ultrasound every 3 years. (Ref.: J Vasc Surg. 2018; 67:2-77 and J Am Coll Radiol 2013;10(10):789-794.) 3. Aortic atherosclerosis. 4. Lucent bone lesions, most prominently a destructive bone lesion in the right iliac bone. Possible metastatic disease. Electronically Signed   By: Burman Nieves M.D.   On: 12/26/2022 22:13    EKG: Independently reviewed. Atrial fibrillation, rate 143.   Assessment/Plan   1. Atrial fibrillation with RVR  - Hx of rapid a fib following RULobectomy in 2017, not anticoagulated pta  - Given IV Lopressor and started on Eliquis in ED - Continue Eliquis, supplement potassium, check magnesium level and TSH, continue atenolol, use IV Lopressor as needed    2. Lucent bone lesions; hx of lung and prostate cancer  - Noted on CT in ED  - Can discuss next steps with oncology in am    3. COPD  - Not in exacerbation   - Continue Spiriva, as-needed albuterol    4. Hypertension  - Continue atenolol   5. CKD 3A  - SCr is 1.27 on admission, appears close to baseline  - Renally-dose medications, monitor   6. Type II DM  - A1c was 5.8% in January 2023  - Check CBGs, use low-intensity SSI if  needed   7. Memory loss  - Use delirium precautions   8. AAA - Noted incidentally on CT in ED  - Discussed with patient, outpatient follow-up recommended    DVT prophylaxis: Eliquis  Code Status: Full Level of Care: Level of care: Progressive Family Communication: Daughter at bedside   Disposition Plan:  Patient is from: home  Anticipated d/c is to: TBD Anticipated d/c date is: 10/4 or 12/29/22  Patient currently: Pending rate-control, PT evaluation  Consults called: None  Admission status: Observation     Briscoe Deutscher, MD [facility]  12/27/2022, 1:04 AM

## 2022-12-27 NOTE — ED Provider Notes (Signed)
Received patient in turnover from Dr. Earlene Plater.  Please see their note for further details of Hx, PE.  Briefly patient is a 82 y.o. male with a Abdominal Pain .  CT imaging without obvious pathology.  Plan to obtain a UA and likely discharge home.  Notified by nursing the patient is now tachycardic.  Now in atrial fibrillation with rapid ventricular response.  Given a dose of metoprolol.  Patient now rate controlled.  Feels very weak.  Discussed with family not comfortable with cardioversion at this time.  Will discuss with medicine for admission.     Melene Plan, DO 12/27/22 [phone]

## 2022-12-27 NOTE — Hospital Course (Addendum)
The patient is an 82 year old African-American male with a past medical history significant for but not limited to prostate cancer status post XRT and ADT and cancer of the right lung status post lobectomy and left upper lobe nodule status post SVR in 2017, history of CKD stage IIIa, hypertension, COPD, memory loss and a history of episode of rapid atrial fibrillation following his right upper lobe lobectomy who presented with abdominal discomfort and loss of appetite.  He presented with multiple vague complaints including abdominal discomfort and generalized weakness.  Family had noted that he has not been eating or drinking much recently and thinks that the abdominal pain and discomfort has been present for weeks to months and has been intermittent with associated nausea but no vomiting, diarrhea fevers or chills.  Patient has denied any chest pain or lightheadedness or dizziness.  On arrival to the ED is found to be afebrile and saturating well on room air.  He subsequently went into rapid atrial fibrillation with RVR with rates in the 140s in the ED.  Labs were notable for potassium 3.1 and a creatinine 1.27 normal troponins and a normal WBC.  CT scan of the abdomen pelvis was done and demonstrated 3.9 cm AAA and lucent bone lesions.  He was treated with potassium chloride Pepcid acetaminophen and was given Lopressor and started on Eliquis.  Subsequently urology was consulted and recommended getting a biopsy and then he was transitioned off of the Eliquis to heparin drip.  Interventional radiology evaluated and patient underwent a CT-guided right iliac bone biopsy.   Assessment and Plan:  Atrial Fibrillation with RVR status post conversion to normal sinus rhythm -Hx of rapid a fib following RULobectomy in 2017, not anticoagulated pta  -Given IV Lopressor and started on Eliquis in ED however he is transition to heparin drip today -Continue Eliquis, supplement potassium, check magnesium level and TSH, use  IV Lopressor as needed   -Hesitant to start Diltiazem gtt given low BP -Order Trop and ECHO and echocardiogram is still pending to be done -Cardiology consulted for further evaluation and recommendations and they are recommending metoprolol tartrate 25 mg p.o. twice daily and transition back to Eliquis 5 mg p.o. twice daily once he is done with his procedures   Lucent bone lesions; hx of lung and prostate cancer  -Noted on CT in ED  -Discussed next steps with oncology Dr. Truett Perna this AM and recommending outpatient follow up with PET Scan however urology was consulted for the right iliac bone lesion and associated soft tissue mass and given his history of prostate and lung cancer Dr. Annabell Howells had ordered a PSA level and recommended that the lesion be biopsied -Dr. Annabell Howells feels that if the PSA is elevated a PSMA PET scan could be considered and if the bone biopsy reveals prostate cancer and that he would need ADT and consideration of local radiation therapy   COPD  -Not in Exacerbation   -Continue Spiriva, as-needed albuterol    Abdominal Pain and Discomfort, improved -Improving -CT Scan done and showed "No evidence of bowel obstruction or inflammation. 3.9 cm diameter abdominal aortic aneurysm. Aortic atherosclerosis. Lucent bone lesions, most prominently a destructive bone lesion in the right iliac bone. Possible metastatic disease." -Continue to Monitor    Essential Hypertension  -Cardiology consulted and started Metoprolol po instead of Atenolol -BP on the low side so will need to monitor carefully -Continue to Monitor BP per Protocol -Last BP reading was soft at 96/64   CKD 3A  -  SCr is 1.27 on admission, appears close to baseline  -BUN/Cr Trend: Recent Labs  Lab 12/26/22 1225 12/27/22 0640 12/28/22 0405  BUN 14 14 13   CREATININE 1.27* 1.13 1.13  -Avoid Nephrotoxic Medications, Contrast Dyes, Hypotension and Dehydration to Ensure Adequate Renal Perfusion and will need to Renally  Adjust Meds -Continue to Monitor and Trend Renal Function carefully and repeat CMP in the AM   Normocytic Anemia -Hgb/Hct Trend: Recent Labs  Lab 12/26/22 1225 12/27/22 0640 12/28/22 0405  HGB 11.7* 11.3* 10.6*  HCT 37.6* 35.8* 34.4*  MCV 91.3 91.1 91.7  -Check Anemia Panel in the AM -Continue to Monitor for S/Sx of Bleeding; No overt bleeding noted -Repeat CBC in the AM  Hypokalemia -Patient's K+ Level Trend: Recent Labs  Lab 12/26/22 1225 12/27/22 0640 12/28/22 0405  K 3.1* 3.8 3.3*  -Replete with Potassium Chloride 40 mEQ BID -Continue to Monitor and Replete as Necessary -Repeat CMP in the AM   Type II DM  -A1c was 5.8% in January 2023 and was the same this visit -Check CBGs, use low-intensity SSI if needed    Memory Loss  -Use delirium precautions    AAA -Noted incidentally on CT in ED  -CT Scan showed "No adrenal gland nodules. Multiple renal cysts. Largest on the left measures 3.6 cm diameter. No imaging follow-up is indicated. No hydronephrosis or hydroureter. Bladder is Normal" -Discussed with patient, outpatient follow-up recommended   Hypoalbuminemia -Patient's Albumin Trend: Recent Labs  Lab 12/26/22 1225 12/28/22 0405  ALBUMIN 3.1* 2.7*  -Continue to Monitor and Trend and repeat CMP in the AM

## 2022-12-27 NOTE — Progress Notes (Signed)
PROGRESS NOTE    Jonathan [name]  ZOX:[id number] DOB: 1940-04-16 DOA: 12/26/2022 PCP: Lianne Moris, PA-C   Brief Narrative:  The patient is an 82 year old African-American male with a past medical history significant for but not limited to prostate cancer status post XRT and ADT and cancer of the right lung status post lobectomy and left upper lobe nodule status post SVR in 2017, history of CKD stage IIIa, hypertension, COPD, memory loss and a history of episode of rapid atrial fibrillation following his right upper lobe lobectomy who presented with abdominal discomfort and loss of appetite.  He presented with multiple vague complaints including abdominal discomfort and generalized weakness.  Family had noted that he has not been eating or drinking much recently and thinks that the abdominal pain and discomfort has been present for weeks to months and has been intermittent with associated nausea but no vomiting, diarrhea fevers or chills.  Patient has denied any chest pain or lightheadedness or dizziness.  On arrival to the ED is found to be afebrile and saturating well on room air.  He subsequently went into rapid atrial fibrillation with RVR with rates in the 140s in the ED.  Labs were notable for potassium 3.1 and a creatinine 1.27 normal troponins and a normal WBC.  CT scan of the abdomen pelvis was done and demonstrated 3.9 cm AAA and lucent bone lesions.  He was treated with potassium chloride Pepcid acetaminophen and was given Lopressor and started on Eliquis.  Currently he is being admitted and treated for the following but not limited to:  Atrial Fibrillation with RVR  -Hx of rapid a fib following RULobectomy in 2017, not anticoagulated pta  -Given IV Lopressor and started on Eliquis in ED -Continue Eliquis, supplement potassium, check magnesium level and TSH, use IV Lopressor as needed   -Hesitant to start Diltiazem gtt given low BP -Order Trop and ECHO -Cardiology consulted for further  evaluation and recommendations   Lucent bone lesions; hx of lung and prostate cancer  -Noted on CT in ED  -Discussed next steps with oncology Dr. Truett Perna this AM and recommending outpatient follow up with PET Scan   COPD  -Not in Exacerbation   -Continue Spiriva, as-needed albuterol    Abdominal Pain and Discomfort -Improving -CT Scan done and showed "No evidence of bowel obstruction or inflammation. 3.9 cm diameter abdominal aortic aneurysm. Aortic atherosclerosis. Lucent bone lesions, most prominently a destructive bone lesion in the right iliac bone. Possible metastatic disease." -Continue to Monitor    Hypertension  -Hold atenolol given Low BP -Cardiology consulted and started Metoprolol po instead of Atenolol -BP on the low side so will need to monitor carefully   CKD 3A  -SCr is 1.27 on admission, appears close to baseline  -BUN/Cr Trend: Recent Labs  Lab 12/26/22 1225 12/27/22 0640  BUN 14 14  CREATININE 1.27* 1.13  -Avoid Nephrotoxic Medications, Contrast Dyes, Hypotension and Dehydration to Ensure Adequate Renal Perfusion and will need to Renally Adjust Meds -Continue to Monitor and Trend Renal Function carefully and repeat CMP in the AM   Normocytic Anemia -Hgb/Hct Trend: Recent Labs  Lab 12/26/22 1225 12/27/22 0640  HGB 11.7* 11.3*  HCT 37.6* 35.8*  MCV 91.3 91.1  -Check Anemia Panel in the AM -Continue to Monitor for S/Sx of Bleeding; No overt bleeding noted -Repeat CBC in the AM  Type II DM  -A1c was 5.8% in January 2023 and was the same this visit -Check CBGs, use low-intensity SSI if needed  Memory loss  -Use delirium precautions    AAA -Noted incidentally on CT in ED  -Discussed with patient, outpatient follow-up recommended   Hypoalbuminemia -Patient's Albumin Trend: Recent Labs  Lab 12/26/22 1225  ALBUMIN 3.1*  -Continue to Monitor and Trend and repeat CMP in the AM   DVT prophylaxis:  apixaban (ELIQUIS) tablet 5 mg    Code  Status: Full Code Family Communication: No family present at bedside  Disposition Plan:  Level of care: Progressive Status is: Observation The patient will require care spanning > 2 midnights and should be moved to inpatient because: Needs improvement in his HR and clearance by Cardiology   Consultants:  Cardiology  Procedures:  ECHOCARDIOGRAM (ordered)  Antimicrobials:  Anti-infectives (From admission, onward)    None      Objective: Vitals:   12/27/22 1023 12/27/22 1030 12/27/22 1200 12/27/22 1230  BP: 94/70 119/68 (!) 142/119   Pulse: (!) 115 71 87   Resp:  20 17   Temp:    97.7 F (36.5 C)  TempSrc:    Oral  SpO2:  98% 100%   Weight:      Height:       No intake or output data in the 24 hours ending 12/27/22 1343 Filed Weights   12/26/22 1206 12/26/22 1220  Weight: 101.2 kg 101.2 kg   Data Reviewed: I have personally reviewed following labs and imaging studies  CBC: Recent Labs  Lab 12/26/22 1225 12/27/22 0640  WBC 10.5 9.1  HGB 11.7* 11.3*  HCT 37.6* 35.8*  MCV 91.3 91.1  PLT 253 238   Basic Metabolic Panel: Recent Labs  Lab 12/26/22 1225 12/27/22 0640  NA 137 136  K 3.1* 3.8  CL 101 98  CO2 29 27  GLUCOSE 111* 103*  BUN 14 14  CREATININE 1.27* 1.13  CALCIUM 8.7* 8.4*  MG  --  2.1   GFR: Estimated Creatinine Clearance: 70.8 mL/min (by C-G formula based on SCr of 1.13 mg/dL). Liver Function Tests: Recent Labs  Lab 12/26/22 1225  AST 16  ALT 14  ALKPHOS 77  BILITOT 0.7  PROT 8.0  ALBUMIN 3.1*   Recent Labs  Lab 12/26/22 1225  LIPASE 34   No results for input(s): "AMMONIA" in the last 168 hours. Coagulation Profile: No results for input(s): "INR", "PROTIME" in the last 168 hours. Cardiac Enzymes: No results for input(s): "CKTOTAL", "CKMB", "CKMBINDEX", "TROPONINI" in the last 168 hours. BNP (last 3 results) No results for input(s): "PROBNP" in the last 8760 hours. HbA1C: Recent Labs    12/27/22 0640  HGBA1C 5.8*    CBG: Recent Labs  Lab 12/27/22 0134 12/27/22 0842 12/27/22 1244  GLUCAP 103* 102* 101*   Lipid Profile: No results for input(s): "CHOL", "HDL", "LDLCALC", "TRIG", "CHOLHDL", "LDLDIRECT" in the last 72 hours. Thyroid Function Tests: Recent Labs    12/27/22 0640  TSH 0.852   Anemia Panel: No results for input(s): "VITAMINB12", "FOLATE", "FERRITIN", "TIBC", "IRON", "RETICCTPCT" in the last 72 hours. Sepsis Labs: No results for input(s): "PROCALCITON", "LATICACIDVEN" in the last 168 hours.  No results found for this or any previous visit (from the past 240 hour(s)).   Radiology Studies: CT ABDOMEN PELVIS W CONTRAST  Result Date: 12/26/2022 CLINICAL DATA:  Acute nonlocalized abdominal pain. Hiatal hernia with abdominal soreness and nausea for 2 weeks. EXAM: CT ABDOMEN AND PELVIS WITH CONTRAST TECHNIQUE: Multidetector CT imaging of the abdomen and pelvis was performed using the standard protocol following bolus administration of intravenous contrast.  RADIATION DOSE REDUCTION: This exam was performed according to the departmental dose-optimization program which includes automated exposure control, adjustment of the mA and/or kV according to patient size and/or use of iterative reconstruction technique. CONTRAST:  OMNIPAQUE IOHEXOL 300 MG/ML  SOLN COMPARISON:  12/24/2022 FINDINGS: Lower chest: Lung bases are clear. Hepatobiliary: No focal liver abnormality is seen. Status post cholecystectomy. No biliary dilatation. Pancreas: Unremarkable. No pancreatic ductal dilatation or surrounding inflammatory changes. Spleen: 1.4 cm diameter and subcentimeter low-attenuation lesions in the spleen likely representing cysts or hemangioma. Adrenals/Urinary Tract: No adrenal gland nodules. Multiple renal cysts. Largest on the left measures 3.6 cm diameter. No imaging follow-up is indicated. No hydronephrosis or hydroureter. Bladder is normal. Stomach/Bowel: Stomach is within normal limits. Appendix  appears normal. No evidence of bowel wall thickening, distention, or inflammatory changes. Vascular/Lymphatic: Calcific and noncalcific abdominal aortic atherosclerosis. Abdominal aortic aneurysm measuring 3.9 cm diameter, unchanged. No dissection. No significant lymphadenopathy. Reproductive: Seed implants in the prostate. Other: No free air or free fluid in the abdomen. Abdominal wall musculature appears intact. Musculoskeletal: Degenerative changes in the spine and hips. Focal destructive bone lesion in the right iliac bone with multiple smaller scattered lucent lesions throughout the pelvis lesions are unchanged since prior study but suspicious for metastatic disease. IMPRESSION: 1. No evidence of bowel obstruction or inflammation. 2. 3.9 cm diameter abdominal aortic aneurysm. Recommend follow-up ultrasound every 3 years. (Ref.: J Vasc Surg. 2018; 67:2-77 and J Am Coll Radiol 2013;10(10):789-794.) 3. Aortic atherosclerosis. 4. Lucent bone lesions, most prominently a destructive bone lesion in the right iliac bone. Possible metastatic disease. Electronically Signed   By: Burman Nieves M.D.   On: 12/26/2022 22:13    Scheduled Meds:  apixaban  5 mg Oral BID   atorvastatin  20 mg Oral Daily   insulin aspart  0-5 Units Subcutaneous QHS   insulin aspart  0-6 Units Subcutaneous TID WC   metoprolol tartrate  25 mg Oral BID   pantoprazole  40 mg Oral Daily   sodium chloride flush  3 mL Intravenous Q12H   sucralfate  1 g Oral BID   umeclidinium bromide  1 puff Inhalation Daily   Continuous Infusions:   LOS: 0 days   Merlene Laughter, DO [facility] Available via Epic secure chat 7am-7pm After these hours, please refer to coverage provider listed on [email or web address] 12/27/2022, 1:43 PM

## 2022-12-27 NOTE — Progress Notes (Signed)
PT Cancellation Note  Patient Details Name: Jonathan [name] MRN: [id number] DOB: 02/14/1941   Cancelled Treatment:    Reason Eval/Treat Not Completed: Patient not medically ready, patient in afib rhythm, noted HR  up into 140's. Will check back when stable to mobilize or MD gives clearance. Blanchard Kelch PT Acute Rehabilitation Services Office [phone] Weekend [phone]    Rada Hay 12/27/2022, 8:31 AM

## 2022-12-27 NOTE — ED Notes (Signed)
ED TO INPATIENT HANDOFF REPORT  ED Nurse Name and Phone #: Serita Kyle Name/Age/Gender Jonathan [name] 82 y.o. male Room/Bed: WA16/WA16  Code Status   Code Status: Full Code  Home/SNF/Other Home Patient oriented to: self, place, time, and situation Is this baseline? Yes   Triage Complete: Triage complete  Chief Complaint Rapid atrial fibrillation [facility]) [I48.91]  Triage Note Pt reports having a hiatal hernia with abdominal soreness and nausea x 2 weeks. Last BM Monday night. Denies vomiting, and fever.   Allergies Allergies  Allergen Reactions   Ampicillin Other (See Comments)    Don't work   Levaquin [Levofloxacin] Other (See Comments)    Doesn't work    Level of Care/Admitting Diagnosis ED Disposition     ED Disposition  Admit   Condition  --   Comment  Hospital Area: [facility] [[id number]]  Level of Care: Progressive [102]  Admit to Progressive based on following criteria: CARDIOVASCULAR & THORACIC of moderate stability with acute coronary syndrome symptoms/low risk myocardial infarction/hypertensive urgency/arrhythmias/heart failure potentially compromising stability and stable post cardiovascular intervention patients.  May place patient in observation at [facility] or [facility] if equivalent level of care is available:: Yes  Covid Evaluation: Asymptomatic - no recent exposure (last 10 days) testing not required  Diagnosis: Rapid atrial fibrillation [facility]) [[id number]]  Admitting Physician: Briscoe Deutscher [[id number]]  Attending Physician: Briscoe Deutscher [[id number]]          B Medical/Surgery History Past Medical History:  Diagnosis Date   Acquired bladder diverticulum    12/ 2012  resection diverticulum done at [facility] in [place], [name]   Age-related cataract of right eye    scheduled for catarat extraction 09-12-2017   Agent orange exposure    Aneurysm of infrarenal abdominal aorta (HCC)    first dx 2017--- last CT 06-11-2017  measures  3.5cm   Anxiety    Chronic constipation    COPD with emphysema (HCC)    06--12-2017  per pt no symptoms since Feb 2019   Depression    PTSD   Diabetes mellitus type 2, diet-controlled (HCC)    Diabetic neuropathy (HCC)    Dyslipidemia    Erectile dysfunction    Feeling of incomplete bladder emptying    Gait abnormality 01/06/2020   GERD (gastroesophageal reflux disease)    Gout    09-02-2017 last flare up 3 months ago   Hiatal hernia    History of atrial fibrillation    episode post op right lung lobectomy 07-04-2015   History of bladder stone    History of colon polyps    History of DVT of lower extremity    03/ 2019  bilateral lower extremity (superficial) ---  per treated w/ oral medication    History of gastritis    History of kidney stones    History of radiation therapy 09-14-2015  to 09-21-2015   left upper lung nodule -- 54Gy in 3 fractions (18 Gy per fraction)   Hyperplasia of prostate with lower urinary tract symptoms (LUTS)    Hypertension    Lumbar spinal stenosis    Nephrolithiasis    Neurogenic bladder    Osteoarthritis    ankle, hands   Osteoporosis    Prostate cancer [facility]) urologist-  dr wrenn/  oncologist-  dr Kathrynn Running   dx 05-24-2017-- Stage T2b,  Gleason 4+4,  PSA 22.41,  vol 38cc--- started ADT 04/ 2019,  plan external radiation therapy   Radiation fibrosis of lung (  HCC)    hx left upper long nodule SBRT 06/ 2017   Squamous cell carcinoma of both lungs (HCC) last CT in epic dated 06-26-2017 no recurrence   dx 03/ 2017  non-small cell SCC via bronchoscopy w/ bx's by dr Dorris Fetch---  s/p  right VATS w/ right lobectomy and node dissection's 07-04-2015 /  pt had SBRT to left upper nodule Stage 1 ([facility]) completed 09-21-2015   Vertigo 2018   Wears dentures    bottom   Wears glasses    Past Surgical History:  Procedure Laterality Date   BIOPSY  05/29/2019   Procedure: BIOPSY;  Surgeon: Malissa Hippo, MD;  [facility];  Service:  Endoscopy;;  gastric ulcer   CARPAL TUNNEL RELEASE Right 07/09/2014   Procedure: RIGHT OPEN CARPAL TUNNEL RELEASE;  Surgeon: Kathryne Hitch, MD;  Location: [facility];  Service: Orthopedics;  Laterality: Right;   CHOLECYSTECTOMY N/A 02/24/2014   Procedure: LAPAROSCOPIC CHOLECYSTECTOMY;  Surgeon: Abigail Miyamoto, MD;  Location: [facility];  Service: General;  Laterality: N/A;   COLONOSCOPY     [facility]  [facility] [place]   CYSTOSCOPY N/A 04/18/2018   Procedure: CYSTOSCOPY FLEXIBLE;  Surgeon: Bjorn Pippin, MD;  Location: [facility];  Service: Urology;  Laterality: N/A;   CYSTOSCOPY W/ LITHOLAPAXY / EHL  02-24-2007   dr Brunilda Payor  [facility]   dental implant     No teeth at this time waiting for them to be made as of 01-30-19   ESOPHAGOGASTRODUODENOSCOPY (EGD) WITH PROPOFOL N/A 05/29/2019   Procedure: ESOPHAGOGASTRODUODENOSCOPY (EGD) WITH PROPOFOL;  Surgeon: Malissa Hippo, MD;  [facility];  Service: Endoscopy;  Laterality: N/A;  925   GOLD SEED IMPLANT N/A 09/05/2017   Procedure: GOLD SEED IMPLANT;  Surgeon: Bjorn Pippin, MD;  Location: [facility];  Service: Urology;  Laterality: N/A;   INSERTION OF SUPRAPUBIC CATHETER N/A 04/18/2018   Procedure: SUPRAPUBIC TUBE CHANGE;  Surgeon: Bjorn Pippin, MD;  Location: [facility];  Service: Urology;  Laterality: N/A;   IR CATHETER TUBE CHANGE  02/24/2018   LUMBAR LAMINECTOMY/DECOMPRESSION MICRODISCECTOMY Right 03/31/2021   Procedure: Bilateral Lumbar Three- Four, Lumbar Four-Five Laminectomy, Decompression;  Surgeon: Coletta Memos, MD;  Location: [facility];  Service: Neurosurgery;  Laterality: Right;   SPACE OAR INSTILLATION N/A 09/05/2017   Procedure: SPACE OAR INSTILLATION;  Surgeon: Bjorn Pippin, MD;  Location: [facility];  Service: Urology;  Laterality: N/A;   TOTAL ANKLE ARTHROPLASTY Right 04/20/2015   Procedure: TOTAL ANKLE ARTHOPLASTY;  Surgeon: Nadara Mustard, MD;  Location[facility];  Service: Orthopedics;   Laterality: Right;   TRANSTHORACIC ECHOCARDIOGRAM  11/16/2012   ef 55-60%,  grade 1 diastolic dysfunction/  mild dilated ascending aorta/  mild MR/ trivial TR   TRANSURETHRAL RESECTION OF PROSTATE N/A 02/03/2019   Procedure: TRANSURETHRAL RESECTION OF THE PROSTATE (TURP);  Surgeon: Bjorn Pippin, MD;  Location[facility];  Service: Urology;  Laterality: N/A;   VIDEO ASSISTED THORACOSCOPY (VATS)/ LOBECTOMY Right 07/04/2015   Procedure: VIDEO ASSISTED THORACOSCOPY (VATS)/ RIGHT UPPER LOBECTOMY;  Surgeon: Loreli Slot, MD;  Location: [facility] OR;  Service: Thoracic;  Laterality: Right;   VIDEO BRONCHOSCOPY N/A 06/17/2015   Procedure: VIDEO BRONCHOSCOPY;  Surgeon: Loreli Slot, MD;  Location: [facility] OR;  Service: Thoracic;  Laterality: N/A;   VIDEO BRONCHOSCOPY WITH ENDOBRONCHIAL NAVIGATION N/A 06/17/2015   Procedure: VIDEO BRONCHOSCOPY WITH ENDOBRONCHIAL NAVIGATION;  Surgeon: Loreli Slot,  MD;  Location[facility];  Service: Thoracic;  Laterality: N/A;   VIDEO BRONCHOSCOPY WITH ENDOBRONCHIAL ULTRASOUND N/A 06/17/2015   Procedure: VIDEO BRONCHOSCOPY WITH ENDOBRONCHIAL ULTRASOUND;  Surgeon: Loreli Slot, MD;  Location: [facility];  Service: Thoracic;  Laterality: N/A;     A IV Location/Drains/Wounds Patient Lines/Drains/Airways Status     Active Line/Drains/Airways     Name Placement date Placement time Site Days   Peripheral IV 12/26/22 20 G Right Antecubital 12/26/22  2005  Antecubital  1   Incision - 4 Ports Abdomen 1: Mid;Superior 2: Umbilicus 3: Right;Lateral 4: Right;Mid 02/24/14  0850  -- 3228            Intake/Output Last 24 hours No intake or output data in the 24 hours ending 12/27/22 1546  Labs/Imaging Results for orders placed or performed during the hospital encounter of 12/26/22 (from the past 48 hour(s))  Lipase, blood     Status: None   Collection Time: 12/26/22 12:25 PM  Result Value Ref Range   Lipase 34 11 - 51 U/L    Comment: Performed at [facility], [address] [place], [name] [address]  Comprehensive metabolic panel     Status: Abnormal   Collection Time: 12/26/22 12:25 PM  Result Value Ref Range   Sodium 137 135 - 145 mmol/L   Potassium 3.1 (L) 3.5 - 5.1 mmol/L   Chloride 101 98 - 111 mmol/L   CO2 29 22 - 32 mmol/L   Glucose, Bld 111 (H) 70 - 99 mg/dL    Comment: Glucose reference range applies only to samples taken after fasting for at least 8 hours.   BUN 14 8 - 23 mg/dL   Creatinine, Ser 6.21 (H) 0.61 - 1.24 mg/dL   Calcium 8.7 (L) 8.9 - 10.3 mg/dL   Total Protein 8.0 6.5 - 8.1 g/dL   Albumin 3.1 (L) 3.5 - 5.0 g/dL   AST 16 15 - 41 U/L   ALT 14 0 - 44 U/L   Alkaline Phosphatase 77 38 - 126 U/L   Total Bilirubin 0.7 0.3 - 1.2 mg/dL   GFR, Estimated 57 (L) >60 mL/min    Comment: (NOTE) Calculated using the CKD-EPI Creatinine Equation (2021)    Anion gap 7 5 - 15    Comment: Performed at [facility], [address] [place], [name] [address]  CBC     Status: Abnormal   Collection Time: 12/26/22 12:25 PM  Result Value Ref Range   WBC 10.5 4.0 - 10.5 K/uL   RBC 4.12 (L) 4.22 - 5.81 MIL/uL   Hemoglobin 11.7 (L) 13.0 - 17.0 g/dL   HCT 78.4 (L) 69.6 - 29.5 %   MCV 91.3 80.0 - 100.0 fL   MCH 28.4 26.0 - 34.0 pg   MCHC 31.1 30.0 - 36.0 g/dL   RDW 28.4 13.2 - 44.0 %   Platelets 253 150 - 400 K/uL   nRBC 0.0 0.0 - 0.2 %    Comment: Performed at [facility], [address] [place], [name] [address]  Troponin I (High Sensitivity)     Status: None   Collection Time: 12/26/22 10:01 PM  Result Value Ref Range   Troponin I (High Sensitivity) 6 <18 ng/L    Comment: (NOTE) Elevated high sensitivity troponin I (hsTnI) values and significant  changes across serial measurements may suggest ACS but many other  chronic and acute conditions are known to elevate hsTnI results.  Refer  to the "Links" section for chest pain algorithms and additional  guidance. Performed  at [facility], [address], [place], [name] [address]   Urinalysis, Routine w reflex microscopic -Urine, Clean Catch     Status: Abnormal   Collection Time: 12/26/22 11:45 PM  Result Value Ref Range   Color, Urine YELLOW YELLOW   APPearance CLEAR CLEAR   Specific Gravity, Urine >1.046 (H) 1.005 - 1.030   pH 5.0 5.0 - 8.0   Glucose, UA NEGATIVE NEGATIVE mg/dL   Hgb urine dipstick NEGATIVE NEGATIVE   Bilirubin Urine NEGATIVE NEGATIVE   Ketones, ur NEGATIVE NEGATIVE mg/dL   Protein, ur NEGATIVE NEGATIVE mg/dL   Nitrite NEGATIVE NEGATIVE   Leukocytes,Ua NEGATIVE NEGATIVE    Comment: Performed at [facility], [address], [place], [name] [address]  CBG monitoring, ED     Status: Abnormal   Collection Time: 12/27/22  1:34 AM  Result Value Ref Range   Glucose-Capillary 103 (H) 70 - 99 mg/dL    Comment: Glucose reference range applies only to samples taken after fasting for at least 8 hours.  Hemoglobin A1c     Status: Abnormal   Collection Time: 12/27/22  6:40 AM  Result Value Ref Range   Hgb A1c MFr Bld 5.8 (H) 4.8 - 5.6 %    Comment: (NOTE) Pre diabetes:          5.7%-6.4%  Diabetes:              >6.4%  Glycemic control for   <7.0% adults with diabetes    Mean Plasma Glucose 119.76 mg/dL    Comment: Performed at [facility], [address] [place], [name] [address]  TSH     Status: None   Collection Time: 12/27/22  6:40 AM  Result Value Ref Range   TSH 0.852 0.350 - 4.500 uIU/mL    Comment: Performed by a 3rd Generation assay with a functional sensitivity of <=0.01 uIU/mL. Performed at [facility], [address] [place], [name] [address]   Magnesium     Status: None   Collection Time: 12/27/22  6:40 AM  Result Value Ref Range   Magnesium 2.1 1.7 - 2.4 mg/dL    Comment: Performed at [facility], [address], [place], [name] [address]  Basic metabolic panel     Status:  Abnormal   Collection Time: 12/27/22  6:40 AM  Result Value Ref Range   Sodium 136 135 - 145 mmol/L   Potassium 3.8 3.5 - 5.1 mmol/L   Chloride 98 98 - 111 mmol/L   CO2 27 22 - 32 mmol/L   Glucose, Bld 103 (H) 70 - 99 mg/dL    Comment: Glucose reference range applies only to samples taken after fasting for at least 8 hours.   BUN 14 8 - 23 mg/dL   Creatinine, Ser 1.30 0.61 - 1.24 mg/dL   Calcium 8.4 (L) 8.9 - 10.3 mg/dL   GFR, Estimated >86 >57 mL/min    Comment: (NOTE) Calculated using the CKD-EPI Creatinine Equation (2021)    Anion gap 11 5 - 15    Comment: Performed at [facility], [address] [place], [name] [address]  CBC     Status: Abnormal   Collection Time: 12/27/22  6:40 AM  Result Value Ref Range   WBC 9.1 4.0 - 10.5 K/uL   RBC 3.93 (L) 4.22 - 5.81 MIL/uL   Hemoglobin 11.3 (  L) 13.0 - 17.0 g/dL   HCT 60.4 (L) 54.0 - 98.1 %   MCV 91.1 80.0 - 100.0 fL   MCH 28.8 26.0 - 34.0 pg   MCHC 31.6 30.0 - 36.0 g/dL   RDW 19.1 47.8 - 29.5 %   Platelets 238 150 - 400 K/uL   nRBC 0.0 0.0 - 0.2 %    Comment: Performed at [facility], [address], [place], [name] [address]  CBG monitoring, ED     Status: Abnormal   Collection Time: 12/27/22  8:42 AM  Result Value Ref Range   Glucose-Capillary 102 (H) 70 - 99 mg/dL    Comment: Glucose reference range applies only to samples taken after fasting for at least 8 hours.  Troponin I (High Sensitivity)     Status: None   Collection Time: 12/27/22 10:30 AM  Result Value Ref Range   Troponin I (High Sensitivity) 9 <18 ng/L    Comment: (NOTE) Elevated high sensitivity troponin I (hsTnI) values and significant  changes across serial measurements may suggest ACS but many other  chronic and acute conditions are known to elevate hsTnI results.  Refer to the "Links" section for chest pain algorithms and additional  guidance. Performed at [facility], [address] [place], [name] [address]   CBG monitoring, ED     Status: Abnormal   Collection Time: 12/27/22 12:44 PM  Result Value Ref Range   Glucose-Capillary 101 (H) 70 - 99 mg/dL    Comment: Glucose reference range applies only to samples taken after fasting for at least 8 hours.   CT ABDOMEN PELVIS W CONTRAST  Result Date: 12/26/2022 CLINICAL DATA:  Acute nonlocalized abdominal pain. Hiatal hernia with abdominal soreness and nausea for 2 weeks. EXAM: CT ABDOMEN AND PELVIS WITH CONTRAST TECHNIQUE: Multidetector CT imaging of the abdomen and pelvis was performed using the standard protocol following bolus administration of intravenous contrast. RADIATION DOSE REDUCTION: This exam was performed according to the departmental dose-optimization program which includes automated exposure control, adjustment of the mA and/or kV according to patient size and/or use of iterative reconstruction technique. CONTRAST:  OMNIPAQUE IOHEXOL 300 MG/ML  SOLN COMPARISON:  12/24/2022 FINDINGS: Lower chest: Lung bases are clear. Hepatobiliary: No focal liver abnormality is seen. Status post cholecystectomy. No biliary dilatation. Pancreas: Unremarkable. No pancreatic ductal dilatation or surrounding inflammatory changes. Spleen: 1.4 cm diameter and subcentimeter low-attenuation lesions in the spleen likely representing cysts or hemangioma. Adrenals/Urinary Tract: No adrenal gland nodules. Multiple renal cysts. Largest on the left measures 3.6 cm diameter. No imaging follow-up is indicated. No hydronephrosis or hydroureter. Bladder is normal. Stomach/Bowel: Stomach is within normal limits. Appendix appears normal. No evidence of bowel wall thickening, distention, or inflammatory changes. Vascular/Lymphatic: Calcific and noncalcific abdominal aortic atherosclerosis. Abdominal aortic aneurysm measuring 3.9 cm diameter, unchanged. No dissection. No significant lymphadenopathy. Reproductive: Seed implants in the prostate. Other: No  free air or free fluid in the abdomen. Abdominal wall musculature appears intact. Musculoskeletal: Degenerative changes in the spine and hips. Focal destructive bone lesion in the right iliac bone with multiple smaller scattered lucent lesions throughout the pelvis lesions are unchanged since prior study but suspicious for metastatic disease. IMPRESSION: 1. No evidence of bowel obstruction or inflammation. 2. 3.9 cm diameter abdominal aortic aneurysm. Recommend follow-up ultrasound every 3 years. (Ref.: J Vasc Surg. 2018; 67:2-77 and J Am Coll Radiol 2013;10(10):789-794.) 3. Aortic atherosclerosis. 4. Lucent bone lesions, most prominently a destructive bone lesion in  the right iliac bone. Possible metastatic disease. Electronically Signed   By: Burman Nieves M.D.   On: 12/26/2022 22:13    Pending Labs Unresulted Labs (From admission, onward)     Start     Ordered   12/28/22 0500  CBC with Differential/Platelet  Tomorrow morning,   R        12/27/22 1344   12/28/22 0500  Comprehensive metabolic panel  Tomorrow morning,   R        12/27/22 1344   12/28/22 0500  Magnesium  Tomorrow morning,   R        12/27/22 1344   12/28/22 0500  Phosphorus  Tomorrow morning,   R        12/27/22 1344            Vitals/Pain Today's Vitals   12/27/22 1030 12/27/22 1200 12/27/22 1230 12/27/22 1345  BP: 119/68 (!) 142/119  115/72  Pulse: 71 87  62  Resp: 20 17  (!) 22  Temp:   97.7 F (36.5 C)   TempSrc:   Oral   SpO2: 98% 100%  100%  Weight:      Height:      PainSc:        Isolation Precautions No active isolations  Medications Medications  apixaban (ELIQUIS) tablet 5 mg (5 mg Oral Given 12/27/22 1025)  atorvastatin (LIPITOR) tablet 20 mg (20 mg Oral Given 12/27/22 1025)  pantoprazole (PROTONIX) EC tablet 40 mg (40 mg Oral Given 12/27/22 1023)  sucralfate (CARAFATE) 1 GM/10ML suspension 1 g (1 g Oral Given 12/27/22 1025)  albuterol (PROVENTIL) (2.5 MG/3ML) 0.083% nebulizer solution 2.5 mg  (has no administration in time range)  insulin aspart (novoLOG) injection 0-6 Units ( Subcutaneous Not Given 12/27/22 1245)  insulin aspart (novoLOG) injection 0-5 Units ( Subcutaneous Canceled Entry 12/27/22 0148)  metoprolol tartrate (LOPRESSOR) injection 5 mg (has no administration in time range)  sodium chloride flush (NS) 0.9 % injection 3 mL (3 mLs Intravenous Given 12/27/22 1033)  acetaminophen (TYLENOL) tablet 650 mg (has no administration in time range)    Or  acetaminophen (TYLENOL) suppository 650 mg (has no administration in time range)  polyethylene glycol (MIRALAX / GLYCOLAX) packet 17 g (has no administration in time range)  ondansetron (ZOFRAN) tablet 4 mg ( Oral See Alternative 12/27/22 0857)    Or  ondansetron (ZOFRAN) injection 4 mg (4 mg Intravenous Given 12/27/22 0857)  oxyCODONE (Oxy IR/ROXICODONE) immediate release tablet 5 mg (has no administration in time range)  umeclidinium bromide (INCRUSE ELLIPTA) 62.5 MCG/ACT 1 puff (1 puff Inhalation Not Given 12/27/22 1246)  metoprolol tartrate (LOPRESSOR) tablet 25 mg (has no administration in time range)  famotidine (PEPCID) tablet 20 mg (20 mg Oral Given 12/26/22 2147)  acetaminophen (TYLENOL) tablet 1,000 mg (1,000 mg Oral Given 12/26/22 2147)  iohexol (OMNIPAQUE) 300 MG/ML solution 100 mL (100 mLs Intravenous Contrast Given 12/26/22 2114)  potassium chloride SA (KLOR-CON M) CR tablet 40 mEq (40 mEq Oral Given 12/26/22 2337)  metoprolol tartrate (LOPRESSOR) injection 5 mg (5 mg Intravenous Given 12/27/22 0013)    Mobility walks     Focused Assessments Ab pain   R Recommendations: See Admitting Provider Note  Report given to:   Additional Notes: .

## 2022-12-28 ENCOUNTER — Observation Stay (HOSPITAL_COMMUNITY): Payer: Medicare PPO

## 2022-12-28 ENCOUNTER — Ambulatory Visit: Payer: Medicare PPO | Admitting: Primary Care

## 2022-12-28 ENCOUNTER — Encounter: Payer: Self-pay | Admitting: Primary Care

## 2022-12-28 DIAGNOSIS — I4891 Unspecified atrial fibrillation: Secondary | ICD-10-CM | POA: Diagnosis not present

## 2022-12-28 DIAGNOSIS — D492 Neoplasm of unspecified behavior of bone, soft tissue, and skin: Secondary | ICD-10-CM | POA: Diagnosis not present

## 2022-12-28 DIAGNOSIS — R413 Other amnesia: Secondary | ICD-10-CM | POA: Diagnosis not present

## 2022-12-28 DIAGNOSIS — M898X8 Other specified disorders of bone, other site: Secondary | ICD-10-CM | POA: Diagnosis not present

## 2022-12-28 DIAGNOSIS — E1143 Type 2 diabetes mellitus with diabetic autonomic (poly)neuropathy: Secondary | ICD-10-CM | POA: Diagnosis not present

## 2022-12-28 DIAGNOSIS — C61 Malignant neoplasm of prostate: Secondary | ICD-10-CM | POA: Diagnosis not present

## 2022-12-28 DIAGNOSIS — J439 Emphysema, unspecified: Secondary | ICD-10-CM | POA: Diagnosis not present

## 2022-12-28 DIAGNOSIS — I714 Abdominal aortic aneurysm, without rupture, unspecified: Secondary | ICD-10-CM | POA: Diagnosis not present

## 2022-12-28 DIAGNOSIS — M899 Disorder of bone, unspecified: Secondary | ICD-10-CM | POA: Diagnosis not present

## 2022-12-28 DIAGNOSIS — I480 Paroxysmal atrial fibrillation: Secondary | ICD-10-CM | POA: Diagnosis not present

## 2022-12-28 DIAGNOSIS — Z85118 Personal history of other malignant neoplasm of bronchus and lung: Secondary | ICD-10-CM | POA: Diagnosis not present

## 2022-12-28 DIAGNOSIS — N1831 Chronic kidney disease, stage 3a: Secondary | ICD-10-CM | POA: Diagnosis not present

## 2022-12-28 DIAGNOSIS — C3402 Malignant neoplasm of left main bronchus: Secondary | ICD-10-CM | POA: Diagnosis not present

## 2022-12-28 DIAGNOSIS — Z8546 Personal history of malignant neoplasm of prostate: Secondary | ICD-10-CM | POA: Diagnosis not present

## 2022-12-28 DIAGNOSIS — I10 Essential (primary) hypertension: Secondary | ICD-10-CM | POA: Diagnosis not present

## 2022-12-28 LAB — COMPREHENSIVE METABOLIC PANEL
ALT: 12 U/L (ref 0–44)
AST: 12 U/L — ABNORMAL LOW (ref 15–41)
Albumin: 2.7 g/dL — ABNORMAL LOW (ref 3.5–5.0)
Alkaline Phosphatase: 60 U/L (ref 38–126)
Anion gap: 10 (ref 5–15)
BUN: 13 mg/dL (ref 8–23)
CO2: 25 mmol/L (ref 22–32)
Calcium: 8 mg/dL — ABNORMAL LOW (ref 8.9–10.3)
Chloride: 98 mmol/L (ref 98–111)
Creatinine, Ser: 1.13 mg/dL (ref 0.61–1.24)
GFR, Estimated: >60 mL/min (ref >60)
Glucose, Bld: 97 mg/dL (ref 70–99)
Potassium: 3.3 mmol/L — ABNORMAL LOW (ref 3.5–5.1)
Sodium: 133 mmol/L — ABNORMAL LOW (ref 135–145)
Total Bilirubin: 0.9 mg/dL (ref 0.3–1.2)
Total Protein: 6.9 g/dL (ref 6.5–8.1)

## 2022-12-28 LAB — CBC WITH DIFFERENTIAL/PLATELET
Abs Immature Granulocytes: 0.03 10*3/uL (ref 0.00–0.07)
Basophils Absolute: 0 10*3/uL (ref 0.0–0.1)
Basophils Relative: 0 %
Eosinophils Absolute: 0.4 10*3/uL (ref 0.0–0.5)
Eosinophils Relative: 5 %
HCT: 34.4 % — ABNORMAL LOW (ref 39.0–52.0)
Hemoglobin: 10.6 g/dL — ABNORMAL LOW (ref 13.0–17.0)
Immature Granulocytes: 0 %
Lymphocytes Relative: 17 %
Lymphs Abs: 1.4 10*3/uL (ref 0.7–4.0)
MCH: 28.3 pg (ref 26.0–34.0)
MCHC: 30.8 g/dL (ref 30.0–36.0)
MCV: 91.7 fL (ref 80.0–100.0)
Monocytes Absolute: 0.5 10*3/uL (ref 0.1–1.0)
Monocytes Relative: 6 %
Neutro Abs: 5.9 10*3/uL (ref 1.7–7.7)
Neutrophils Relative %: 72 %
Platelets: 249 10*3/uL (ref 150–400)
RBC: 3.75 MIL/uL — ABNORMAL LOW (ref 4.22–5.81)
RDW: 14.6 % (ref 11.5–15.5)
WBC: 8.2 10*3/uL (ref 4.0–10.5)
nRBC: 0 % (ref 0.0–0.2)

## 2022-12-28 LAB — PHOSPHORUS: Phosphorus: 2.6 mg/dL (ref 2.5–4.6)

## 2022-12-28 LAB — GLUCOSE, CAPILLARY
Glucose-Capillary: 92 mg/dL (ref 70–99)
Glucose-Capillary: 91 mg/dL (ref 70–99)
Glucose-Capillary: 89 mg/dL (ref 70–99)
Glucose-Capillary: 136 mg/dL — ABNORMAL HIGH (ref 70–99)

## 2022-12-28 LAB — PROTIME-INR
INR: 1.2 (ref 0.8–1.2)
Prothrombin Time: 15.6 s — ABNORMAL HIGH (ref 11.4–15.2)

## 2022-12-28 LAB — MAGNESIUM: Magnesium: 2.1 mg/dL (ref 1.7–2.4)

## 2022-12-28 LAB — PSA: Prostatic Specific Antigen: 0.08 ng/mL (ref 0.00–4.00)

## 2022-12-28 MED ORDER — MIDAZOLAM HCL 2 MG/2ML IJ SOLN
INTRAMUSCULAR | Status: AC
  Filled 2022-12-28: qty 2

## 2022-12-28 MED ORDER — HEPARIN (PORCINE) 25000 UT/250ML-% IV SOLN
1450.0000 [IU]/h | INTRAVENOUS | Status: DC
  Filled 2022-12-28: qty 250

## 2022-12-28 MED ORDER — HEPARIN (PORCINE) 25000 UT/250ML-% IV SOLN: 1450.0000 [IU]/h | INTRAVENOUS | Status: DC

## 2022-12-28 MED ORDER — SODIUM CHLORIDE 0.9 % IV SOLN
INTRAVENOUS | Status: AC
  Filled 2022-12-28: qty 250

## 2022-12-28 MED ORDER — POTASSIUM CHLORIDE CRYS ER 20 MEQ PO TBCR
40.0000 meq | EXTENDED_RELEASE_TABLET | Freq: Once | ORAL | Status: AC
  Administered 2022-12-28: 40 meq via ORAL
  Filled 2022-12-28: qty 2

## 2022-12-28 MED ORDER — HEPARIN (PORCINE) 25000 UT/250ML-% IV SOLN
1450.0000 [IU]/h | INTRAVENOUS | Status: AC
  Administered 2022-12-28 – 2022-12-29 (×2): 1450 [IU]/h via INTRAVENOUS
  Filled 2022-12-28 (×2): qty 250

## 2022-12-28 MED ORDER — FENTANYL CITRATE (PF) 100 MCG/2ML IJ SOLN
INTRAMUSCULAR | Status: AC | PRN
Start: 2022-12-28 — End: 2022-12-28
  Administered 2022-12-28 (×2): 25 ug via INTRAVENOUS

## 2022-12-28 MED ORDER — FENTANYL CITRATE (PF) 100 MCG/2ML IJ SOLN
INTRAMUSCULAR | Status: AC
  Filled 2022-12-28: qty 2

## 2022-12-28 NOTE — Progress Notes (Signed)
  Went by to see patient multiple times but he has been down in CT. Reviewed chart. He is back in normal sinus rhythm with rates in the 60s to 70s. BP soft but stable. Continue Lopressor 25mg  twice daily. Currently on IV Heparin for anticoagulation. Can switch to Eliquis 5mg  twice daily after all procedures are down. Dr. Antoine Poche confirmed with wife yesterday that he is not having frequent falls at home. Echo is still pending. We will follow-up on this.  Corrin Parker, PA-C 12/28/2022 12:21 PM

## 2022-12-28 NOTE — Evaluation (Addendum)
Physical Therapy Evaluation Patient Details Name: Jonathan [name] MRN: [id number] DOB: September 21, 1940 Today's Date: 12/28/2022  History of Present Illness  Jonathan [name] is a 82 yo male presents with abdominal discomfort and generalized weakness. Pt s/p CT guided biopsy of infiltrative right iliac lesion 10/4. PMH: prostate cancer s/p EXRT and ADT, cancer of the right lung s/p lobectomy and left upper lobe nodule status post SBR in 2017, CKD 3A, HTN, COPD, memory loss, and an episode of rapid atrial fibrillation following right upper lobectomy in 2017  Clinical Impression  Pt admitted with above diagnosis. Pt from home with spouse, reports spouse and aide assist him with self care and complete the household chores, reports has [facility] and rollator for in home ambulation, doesn't ambulate in community, reports falls in the last 6 months without specific number, reports all DME needs met at home. Pt currently comes to sitting EOB without physical assist, slightly increased time. Pt powers to stand from elevated bed with BUE assisting and CGA; pt also powers to stand from recliner chair with strong UE assisting to power up. Pt amb ~15 ft in room with RW, slow cadence, denies pain, no overt LOB or LE buckling noted. Pt slightly impulsive with mobility, needing verbal cues for DME positioning and hand placement to improve safety. Pt tolerates remaining up in recliner to eat lunch. Pt without complaints during session, HR in 60s-70s. Recommend HHPT at d/c with spouse and aide support. Pt currently with functional limitations due to the deficits listed below (see PT Problem List). Pt will benefit from acute skilled PT to increase their independence and safety with mobility to allow discharge.           If plan is discharge home, recommend the following: A little help with walking and/or transfers;A little help with bathing/dressing/bathroom;Assistance with cooking/housework;Assist for transportation;Help with  stairs or ramp for entrance   Can travel by private vehicle        Equipment Recommendations None recommended by PT  Recommendations for Other Services       Functional Status Assessment Patient has had a recent decline in their functional status and demonstrates the ability to make significant improvements in function in a reasonable and predictable amount of time.     Precautions / Restrictions Precautions Precautions: Fall Restrictions Weight Bearing Restrictions: No      Mobility  Bed Mobility Overal bed mobility: Modified Independent             General bed mobility comments: slightly increased time    Transfers Overall transfer level: Needs assistance Equipment used: Rolling walker (2 wheels) Transfers: Sit to/from Stand Sit to Stand: Contact guard assist, From elevated surface           General transfer comment: pt slightly impulsive, CGA for safety, rocking momentum with strong UE assist to power up, elevated bed height due to pt's tall stature    Ambulation/Gait Ambulation/Gait assistance: Contact guard assist Gait Distance (Feet): 15 Feet Assistive device: Rolling walker (2 wheels) Gait Pattern/deviations: Step-through pattern, Decreased stride length Gait velocity: decreased     General Gait Details: step through gait pattern with trunk slightly flexed, verbal cues for RW management to improve safety and maintain body within RW frame, denies pain, limited by lunch tray arrival  Stairs            Wheelchair Mobility     Tilt Bed    Modified Rankin (Stroke Patients Only)       Balance Overall  balance assessment: Mild deficits observed, not formally tested, History of Falls                                           Pertinent Vitals/Pain Pain Assessment Pain Assessment: No/denies pain    Home Living Family/patient expects to be discharged to:: Private residence Living Arrangements: Spouse/significant  other;Children Available Help at Discharge: Family;Available 24 hours/day Type of Home: House Home Access: Ramped entrance       Home Layout: One level Home Equipment: Rollator (4 wheels);Cane - single point;BSC/3in1;Shower seat - built in;Grab bars - toilet;Grab bars - tub/shower;Wheelchair - manual;Hospital bed ("braces for both of my legs") Additional Comments: pt reports has aide    Prior Function Prior Level of Function : Needs assist             Mobility Comments: pt reports using SPC or rollator for in home ambulation, doesn't amb in community ADLs Comments: pt reports spouse and aide assists with self care tasks, also spouse and aide completes household chores     Extremity/Trunk Assessment   Upper Extremity Assessment Upper Extremity Assessment: Defer to OT evaluation    Lower Extremity Assessment Lower Extremity Assessment: RLE deficits/detail;LLE deficits/detail RLE Deficits / Details: AROM WFL, strength grossly 4/5, reports peripheral neuropathy up into knees RLE Sensation: history of peripheral neuropathy RLE Coordination: WNL LLE Deficits / Details: AROM WFL, strength grossly 4/5, reports peripheral neuropathy up into knees LLE Sensation: history of peripheral neuropathy LLE Coordination: WNL    Cervical / Trunk Assessment Cervical / Trunk Assessment: Normal  Communication   Communication Communication: No apparent difficulties  Cognition Arousal: Alert Behavior During Therapy: WFL for tasks assessed/performed, Impulsive Overall Cognitive Status: Within Functional Limits for tasks assessed                                 General Comments: pt alert and oriented to self, location and situation, slightly impulsive trying to mobilize before AD in place and self directed needing cues for safety        General Comments      Exercises     Assessment/Plan    PT Assessment Patient needs continued PT services  PT Problem List Decreased  strength;Decreased activity tolerance;Decreased balance;Decreased knowledge of use of DME;Decreased safety awareness;Impaired sensation       PT Treatment Interventions DME instruction;Gait training;Functional mobility training;Therapeutic activities;Therapeutic exercise;Balance training;Neuromuscular re-education;Patient/family education    PT Goals (Current goals can be found in the Care Plan section)  Acute Rehab PT Goals Patient Stated Goal: go home PT Goal Formulation: With patient Time For Goal Achievement: 01/11/23 Potential to Achieve Goals: Good    Frequency Min 1X/week     Co-evaluation               AM-PAC PT "6 Clicks" Mobility  Outcome Measure Help needed turning from your back to your side while in a flat bed without using bedrails?: A Little Help needed moving from lying on your back to sitting on the side of a flat bed without using bedrails?: A Little Help needed moving to and from a bed to a chair (including a wheelchair)?: A Little Help needed standing up from a chair using your arms (e.g., wheelchair or bedside chair)?: A Little Help needed to walk in hospital room?: A Little Help needed  climbing 3-5 steps with a railing? : A Lot 6 Click Score: 17    End of Session Equipment Utilized During Treatment: Gait belt Activity Tolerance: Patient tolerated treatment well Patient left: in chair;with call bell/phone within reach;with chair alarm set Nurse Communication: Mobility status PT Visit Diagnosis: Other abnormalities of gait and mobility (R26.89);Difficulty in walking, not elsewhere classified (R26.2)    Time: 4098-1191 PT Time Calculation (min) (ACUTE ONLY): 22 min   Charges:   PT Evaluation $PT Eval Low Complexity: 1 Low   PT General Charges $$ ACUTE PT VISIT: 1 Visit          Tori Race Latour PT, DPT 12/28/22, 2:35 PM

## 2022-12-28 NOTE — Progress Notes (Signed)
PROGRESS NOTE    Jonathan [name]  QMV:[id number] DOB: 11-14-1940 DOA: 12/26/2022 PCP: Lianne Moris, PA-C   Brief Narrative:  The patient is an 82 year old African-American male with a past medical history significant for but not limited to prostate cancer status post XRT and ADT and cancer of the right lung status post lobectomy and left upper lobe nodule status post SVR in 2017, history of CKD stage IIIa, hypertension, COPD, memory loss and a history of episode of rapid atrial fibrillation following his right upper lobe lobectomy who presented with abdominal discomfort and loss of appetite.  He presented with multiple vague complaints including abdominal discomfort and generalized weakness.  Family had noted that he has not been eating or drinking much recently and thinks that the abdominal pain and discomfort has been present for weeks to months and has been intermittent with associated nausea but no vomiting, diarrhea fevers or chills.  Patient has denied any chest pain or lightheadedness or dizziness.  On arrival to the ED is found to be afebrile and saturating well on room air.  He subsequently went into rapid atrial fibrillation with RVR with rates in the 140s in the ED.  Labs were notable for potassium 3.1 and a creatinine 1.27 normal troponins and a normal WBC.  CT scan of the abdomen pelvis was done and demonstrated 3.9 cm AAA and lucent bone lesions.  He was treated with potassium chloride Pepcid acetaminophen and was given Lopressor and started on Eliquis.  Subsequently urology was consulted and recommended getting a biopsy and then he was transitioned off of the Eliquis to heparin drip.  Interventional radiology evaluated and patient underwent a CT-guided right iliac bone biopsy.   Assessment and Plan:  Atrial Fibrillation with RVR status post conversion to normal sinus rhythm -Hx of rapid a fib following RULobectomy in 2017, not anticoagulated pta  -Given IV Lopressor and started on  Eliquis in ED however he is transition to heparin drip today -Continue Eliquis, supplement potassium, check magnesium level and TSH, use IV Lopressor as needed   -Hesitant to start Diltiazem gtt given low BP -Order Trop and ECHO and echocardiogram is still pending to be done -Cardiology consulted for further evaluation and recommendations and they are recommending metoprolol tartrate 25 mg p.o. twice daily and transition back to Eliquis 5 mg p.o. twice daily once he is done with his procedures   Lucent bone lesions; hx of lung and prostate cancer  -Noted on CT in ED  -Discussed next steps with oncology Dr. Truett Perna this AM and recommending outpatient follow up with PET Scan however urology was consulted for the right iliac bone lesion and associated soft tissue mass and given his history of prostate and lung cancer Dr. Annabell Howells had ordered a PSA level and recommended that the lesion be biopsied -Dr. Annabell Howells feels that if the PSA is elevated a PSMA PET scan could be considered and if the bone biopsy reveals prostate cancer and that he would need ADT and consideration of local radiation therapy   COPD  -Not in Exacerbation   -Continue Spiriva, as-needed albuterol    Abdominal Pain and Discomfort, improved -Improving -CT Scan done and showed "No evidence of bowel obstruction or inflammation. 3.9 cm diameter abdominal aortic aneurysm. Aortic atherosclerosis. Lucent bone lesions, most prominently a destructive bone lesion in the right iliac bone. Possible metastatic disease." -Continue to Monitor    Essential Hypertension  -Cardiology consulted and started Metoprolol po instead of Atenolol -BP on the low side so  will need to monitor carefully -Continue to Monitor BP per Protocol -Last BP reading was soft at 96/64   CKD 3A  -SCr is 1.27 on admission, appears close to baseline  -BUN/Cr Trend: Recent Labs  Lab 12/26/22 1225 12/27/22 0640 12/28/22 0405  BUN 14 14 13   CREATININE 1.27* 1.13 1.13   -Avoid Nephrotoxic Medications, Contrast Dyes, Hypotension and Dehydration to Ensure Adequate Renal Perfusion and will need to Renally Adjust Meds -Continue to Monitor and Trend Renal Function carefully and repeat CMP in the AM   Normocytic Anemia -Hgb/Hct Trend: Recent Labs  Lab 12/26/22 1225 12/27/22 0640 12/28/22 0405  HGB 11.7* 11.3* 10.6*  HCT 37.6* 35.8* 34.4*  MCV 91.3 91.1 91.7  -Check Anemia Panel in the AM -Continue to Monitor for S/Sx of Bleeding; No overt bleeding noted -Repeat CBC in the AM  Hypokalemia -Patient's K+ Level Trend: Recent Labs  Lab 12/26/22 1225 12/27/22 0640 12/28/22 0405  K 3.1* 3.8 3.3*  -Replete with Potassium Chloride 40 mEQ BID -Continue to Monitor and Replete as Necessary -Repeat CMP in the AM   Type II DM  -A1c was 5.8% in January 2023 and was the same this visit -Check CBGs, use low-intensity SSI if needed    Memory Loss  -Use delirium precautions    AAA -Noted incidentally on CT in ED  -CT Scan showed "No adrenal gland nodules. Multiple renal cysts. Largest on the left measures 3.6 cm diameter. No imaging follow-up is indicated. No hydronephrosis or hydroureter. Bladder is Normal" -Discussed with patient, outpatient follow-up recommended   Hypoalbuminemia -Patient's Albumin Trend: Recent Labs  Lab 12/26/22 1225 12/28/22 0405  ALBUMIN 3.1* 2.7*  -Continue to Monitor and Trend and repeat CMP in the AM   DVT prophylaxis: Anticoagulated with a Heparin gtt    Code Status: Full Code Family Communication: No family present at bedside  Disposition Plan:  Level of care: Progressive Status is: Observation The patient will require care spanning > 2 midnights and should be moved to inpatient because: Needs further clinical improvement and clearance by Specialists    Consultants:  Cardiology Urology IR Discussed with Medical Oncology   Procedures:  Technically successful CT guided biopsy of infiltrative right iliac  lesion   Antimicrobials:  Anti-infectives (From admission, onward)    None       Subjective: Seen and examined at bedside and he is doing fairly well after his biopsy and states he is just a little sore.  No nausea or vomiting.  States that he is feeling much better since coming into the hospital.  No other concerns or complaint at this time.  Objective: Vitals:   12/28/22 1200 12/28/22 1205 12/28/22 1210 12/28/22 1320  BP: 112/66 112/66 101/68 96/64  Pulse:    64  Resp: 14 16 19 16   Temp:    98.2 F (36.8 C)  TempSrc:    Oral  SpO2: 100% 100% 100% 99%  Weight:      Height:        Intake/Output Summary (Last 24 hours) at 12/28/2022 2025 Last data filed at 12/28/2022 1722 Gross per 24 hour  Intake 600 ml  Output 100 ml  Net 500 ml   Filed Weights   12/26/22 1220 12/27/22 1626 12/28/22 0458  Weight: 101.2 kg 93.9 kg 97 kg   Examination: Physical Exam:  Constitutional: WN/WD African-American male in no acute distress Respiratory: Diminished to auscultation bilaterally, no wheezing, rales, rhonchi or crackles. Normal respiratory effort and patient is not tachypenic.  No accessory muscle use.  Unlabored breathing Cardiovascular: RRR, no murmurs / rubs / gallops. S1 and S2 auscultated. No extremity edema.   Abdomen: Soft, non-tender, non-distended. Bowel sounds positive.  GU: Deferred. Musculoskeletal: No clubbing / cyanosis of digits/nails. No joint deformity upper and lower extremities.  Skin: No rashes, lesions, ulcers on a limited skin evaluation. No induration; Warm and dry.  Neurologic: CN 2-12 grossly intact with no focal deficits. Romberg sign and cerebellar reflexes not assessed.  Psychiatric: Normal judgment and insight. Alert and oriented x 3. Normal mood and appropriate affect.   Data Reviewed: I have personally reviewed following labs and imaging studies  CBC: Recent Labs  Lab 12/26/22 1225 12/27/22 0640 12/28/22 0405  WBC 10.5 9.1 8.2  NEUTROABS  --    --  5.9  HGB 11.7* 11.3* 10.6*  HCT 37.6* 35.8* 34.4*  MCV 91.3 91.1 91.7  PLT 253 238 249   Basic Metabolic Panel: Recent Labs  Lab 12/26/22 1225 12/27/22 0640 12/28/22 0405  NA 137 136 133*  K 3.1* 3.8 3.3*  CL 101 98 98  CO2 29 27 25   GLUCOSE 111* 103* 97  BUN 14 14 13   CREATININE 1.27* 1.13 1.13  CALCIUM 8.7* 8.4* 8.0*  MG  --  2.1 2.1  PHOS  --   --  2.6   GFR: Estimated Creatinine Clearance: 70.8 mL/min (by C-G formula based on SCr of 1.13 mg/dL). Liver Function Tests: Recent Labs  Lab 12/26/22 1225 12/28/22 0405  AST 16 12*  ALT 14 12  ALKPHOS 77 60  BILITOT 0.7 0.9  PROT 8.0 6.9  ALBUMIN 3.1* 2.7*   Recent Labs  Lab 12/26/22 1225  LIPASE 34   No results for input(s): "AMMONIA" in the last 168 hours. Coagulation Profile: Recent Labs  Lab 12/28/22 1311  INR 1.2   Cardiac Enzymes: No results for input(s): "CKTOTAL", "CKMB", "CKMBINDEX", "TROPONINI" in the last 168 hours. BNP (last 3 results) No results for input(s): "PROBNP" in the last 8760 hours. HbA1C: Recent Labs    12/27/22 0640  HGBA1C 5.8*   CBG: Recent Labs  Lab 12/27/22 1712 12/27/22 2045 12/28/22 0747 12/28/22 1316 12/28/22 1647  GLUCAP 98 103* 92 91 89   Lipid Profile: No results for input(s): "CHOL", "HDL", "LDLCALC", "TRIG", "CHOLHDL", "LDLDIRECT" in the last 72 hours. Thyroid Function Tests: Recent Labs    12/27/22 0640  TSH 0.852   Anemia Panel: No results for input(s): "VITAMINB12", "FOLATE", "FERRITIN", "TIBC", "IRON", "RETICCTPCT" in the last 72 hours. Sepsis Labs: No results for input(s): "PROCALCITON", "LATICACIDVEN" in the last 168 hours.  No results found for this or any previous visit (from the past 240 hour(s)).   Radiology Studies: CT BONE TROCAR/NEEDLE BIOPSY DEEP  Result Date: 12/28/2022 INDICATION: History of lung and prostate cancer, now with lytic expansile lesion involving the right ilium. Please perform CT-guided biopsy for tissue diagnostic  purposes. EXAM: CT-GUIDED BONE LESION BIOPSY MEDICATIONS: None COMPARISON:  CT abdomen and pelvis-12/26/2022 ANESTHESIA/SEDATION: Moderate (conscious) sedation was employed during this procedure as administered by the Interventional Radiology RN. A total of Fentanyl 50 mcg was administered intravenously. Moderate Sedation Time: 15 minutes. The patient's level of consciousness and vital signs were monitored continuously by radiology nursing throughout the procedure under my direct supervision. COMPLICATIONS: None immediate. PROCEDURE: Informed consent was obtained from the patient following an explanation of the procedure, risks, benefits and alternatives. The patient understands, agrees and consents for the procedure. All questions were addressed. A time out was performed  prior to the initiation of the procedure. The patient was positioned prone on the CT table and a limited CT was performed for procedural planning demonstrating unchanged size and appearance of the at least 3.2 x 3.2 cm lytic expansile lesion involving the right ilium (image 20, series 2). The procedure was planned. The operative site was prepped and draped in the usual sterile fashion. Appropriate trajectory was confirmed with a 22 gauge spinal needle (series 4) after the adjacent tissues were anesthetized with 1% Lidocaine with epinephrine. Next, an 11 gauge coaxial bone biopsy needle was advanced into the peripheral aspect of the slightly expansile right iliac lesion. Appropriate presumably confirmed with CT imaging. Next, the inner 13 gauge bone biopsy device was utilized to obtain a core needle biopsy followed by the acquisition of additional core needle biopsy with the outer 11 gauge biopsy device. The identical procedure was repeated with the acquisition of 2 additional core needle biopsies with both the 13 gauge bone biopsy device as well as the outer 11 gauge bone biopsy device (representative 11, series 9). The needle was removed and  superficial hemostasis was obtained with manual compression. A dressing was applied. The patient tolerated the procedure well without immediate post procedural complication. IMPRESSION: Technically successful CT-guided core needle biopsy of expansile lytic right iliac osseous lesion. Electronically Signed   By: Simonne Come M.D.   On: 12/28/2022 13:19   CT ABDOMEN PELVIS W CONTRAST  Result Date: 12/26/2022 CLINICAL DATA:  Acute nonlocalized abdominal pain. Hiatal hernia with abdominal soreness and nausea for 2 weeks. EXAM: CT ABDOMEN AND PELVIS WITH CONTRAST TECHNIQUE: Multidetector CT imaging of the abdomen and pelvis was performed using the standard protocol following bolus administration of intravenous contrast. RADIATION DOSE REDUCTION: This exam was performed according to the departmental dose-optimization program which includes automated exposure control, adjustment of the mA and/or kV according to patient size and/or use of iterative reconstruction technique. CONTRAST:  OMNIPAQUE IOHEXOL 300 MG/ML  SOLN COMPARISON:  12/24/2022 FINDINGS: Lower chest: Lung bases are clear. Hepatobiliary: No focal liver abnormality is seen. Status post cholecystectomy. No biliary dilatation. Pancreas: Unremarkable. No pancreatic ductal dilatation or surrounding inflammatory changes. Spleen: 1.4 cm diameter and subcentimeter low-attenuation lesions in the spleen likely representing cysts or hemangioma. Adrenals/Urinary Tract: No adrenal gland nodules. Multiple renal cysts. Largest on the left measures 3.6 cm diameter. No imaging follow-up is indicated. No hydronephrosis or hydroureter. Bladder is normal. Stomach/Bowel: Stomach is within normal limits. Appendix appears normal. No evidence of bowel wall thickening, distention, or inflammatory changes. Vascular/Lymphatic: Calcific and noncalcific abdominal aortic atherosclerosis. Abdominal aortic aneurysm measuring 3.9 cm diameter, unchanged. No dissection. No significant  lymphadenopathy. Reproductive: Seed implants in the prostate. Other: No free air or free fluid in the abdomen. Abdominal wall musculature appears intact. Musculoskeletal: Degenerative changes in the spine and hips. Focal destructive bone lesion in the right iliac bone with multiple smaller scattered lucent lesions throughout the pelvis lesions are unchanged since prior study but suspicious for metastatic disease. IMPRESSION: 1. No evidence of bowel obstruction or inflammation. 2. 3.9 cm diameter abdominal aortic aneurysm. Recommend follow-up ultrasound every 3 years. (Ref.: J Vasc Surg. 2018; 67:2-77 and J Am Coll Radiol 2013;10(10):789-794.) 3. Aortic atherosclerosis. 4. Lucent bone lesions, most prominently a destructive bone lesion in the right iliac bone. Possible metastatic disease. Electronically Signed   By: Burman Nieves M.D.   On: 12/26/2022 22:13    Scheduled Meds:  atorvastatin  20 mg Oral Daily   feeding supplement  237 mL Oral BID BM   insulin aspart  0-5 Units Subcutaneous QHS   insulin aspart  0-6 Units Subcutaneous TID WC   latanoprost  1 drop Both Eyes QHS   metoprolol tartrate  25 mg Oral BID   pantoprazole  40 mg Oral Daily   sodium chloride flush  3 mL Intravenous Q12H   sucralfate  1 g Oral BID   timolol  1 drop Both Eyes BID   umeclidinium bromide  1 puff Inhalation Daily   Continuous Infusions:  heparin 1,450 Units/hr (12/28/22 1905)    LOS: 0 days   Marguerita Merles, DO [facility] Available via Epic secure chat 7am-7pm After these hours, please refer to coverage provider listed on [email or web address] 12/28/2022, 8:25 PM

## 2022-12-28 NOTE — Progress Notes (Signed)
Referring Physician(s): Sheikh,O  Supervising Physician: Simonne Come  Patient Status:  [facility] - In-pt  Chief Complaint: Abdominal/back pain, right iliac bone lesion   Subjective: Pt known to IR team from placement and exchange/upsizing of a SP catheter in 2019. He is an 82 yo male with PMH sig for prostate cancer status post XRT and ADT and cancer of the right lung status post lobectomy and left upper lobe nodule status post SVR in 2017, history of CKD stage IIIa, hypertension, COPD, memory loss and a history of episode of rapid atrial fibrillation following his right upper lobe lobectomy who presented with abdominal discomfort and loss of appetite. He presented to [facility] recently with multiple vague complaints including abdominal discomfort and generalized weakness. CT A/P on 10/2 revealed:   1. No evidence of bowel obstruction or inflammation. 2. 3.9 cm diameter abdominal aortic aneurysm. Recommend follow-up ultrasound every 3 years. (Ref.: J Vasc Surg. 2018; 67:2-77 and J Am Coll Radiol 2013;10(10):789-794.) 3. Aortic atherosclerosis. 4. Lucent bone lesions, most prominently a destructive bone lesion in the right iliac bone. Possible metastatic disease.  Request now received for image guided rt iliac bone lesion bx. Pt denies fever,HA,CP,worsening dyspnea, N/V or bleeding; additional med hx as below  Past Medical History:  Diagnosis Date   Acquired bladder diverticulum    12/ 2012  resection diverticulum done at [facility] in [place], [name]   Age-related cataract of right eye    scheduled for catarat extraction 09-12-2017   Agent orange exposure    Aneurysm of infrarenal abdominal aorta (HCC)    first dx 2017--- last CT 06-11-2017  measures 3.5cm   Anxiety    Chronic constipation    COPD with emphysema (HCC)    06--12-2017  per pt no symptoms since Feb 2019   Depression    PTSD   Diabetes mellitus type 2, diet-controlled (HCC)    Diabetic neuropathy (HCC)    Dyslipidemia     Erectile dysfunction    Feeling of incomplete bladder emptying    Gait abnormality 01/06/2020   GERD (gastroesophageal reflux disease)    Gout    09-02-2017 last flare up 3 months ago   Hiatal hernia    History of atrial fibrillation    episode post op right lung lobectomy 07-04-2015   History of bladder stone    History of colon polyps    History of DVT of lower extremity    03/ 2019  bilateral lower extremity (superficial) ---  per treated w/ oral medication    History of gastritis    History of kidney stones    History of radiation therapy 09-14-2015  to 09-21-2015   left upper lung nodule -- 54Gy in 3 fractions (18 Gy per fraction)   Hyperplasia of prostate with lower urinary tract symptoms (LUTS)    Hypertension    Lumbar spinal stenosis    Nephrolithiasis    Neurogenic bladder    Osteoarthritis    ankle, hands   Osteoporosis    Prostate cancer [facility]) urologist-  dr wrenn/  oncologist-  dr Kathrynn Running   dx 05-24-2017-- Stage T2b,  Gleason 4+4,  PSA 22.41,  vol 38cc--- started ADT 04/ 2019,  plan external radiation therapy   Radiation fibrosis of lung (HCC)    hx left upper long nodule SBRT 06/ 2017   Squamous cell carcinoma of both lungs (HCC) last CT in epic dated 06-26-2017 no recurrence   dx 03/ 2017  non-small cell SCC via bronchoscopy w/ bx's  by dr Dorris Fetch---  s/p  right VATS w/ right lobectomy and node dissection's 07-04-2015 /  pt had SBRT to left upper nodule Stage 1 ([facility]) completed 09-21-2015   Vertigo 2018   Wears dentures    bottom   Wears glasses    Past Surgical History:  Procedure Laterality Date   BIOPSY  05/29/2019   Procedure: BIOPSY;  Surgeon: Malissa Hippo, MD;  [facility];  Service: Endoscopy;;  gastric ulcer   CARPAL TUNNEL RELEASE Right 07/09/2014   Procedure: RIGHT OPEN CARPAL TUNNEL RELEASE;  Surgeon: Kathryne Hitch, MD;  Location: [facility];  Service: Orthopedics;  Laterality: Right;   CHOLECYSTECTOMY N/A  02/24/2014   Procedure: LAPAROSCOPIC CHOLECYSTECTOMY;  Surgeon: Abigail Miyamoto, MD;  Location: [facility];  Service: General;  Laterality: N/A;   COLONOSCOPY     CYST[facility]ITHOT[facility]Y  11/ [facility] [place]   CYSTOSCOPY N/A 04/18/2018   Procedure: CYSTOSCOPY FLEXIBLE;  Surgeon: Bjorn Pippin, MD;  Location: [facility];  Service: Urology;  Laterality: N/A;   CYSTOSCOPY W/ LITHOLAPAXY / EHL  02-24-2007   dr Brunilda Payor  [facility]   dental implant     No teeth at this time waiting for them to be made as of 01-30-19   ESOPHAGOGASTRODUODENOSCOPY (EGD) WITH PROPOFOL N/A 05/29/2019   Procedure: ESOPHAGOGASTRODUODENOSCOPY (EGD) WITH PROPOFOL;  Surgeon: Malissa Hippo, MD;  [facility];  Service: Endoscopy;  Laterality: N/A;  925   GOLD SEED IMPLANT N/A 09/05/2017   Procedure: GOLD SEED IMPLANT;  Surgeon: Bjorn Pippin, MD;  Location: [facility];  Service: Urology;  Laterality: N/A;   INSERTION OF SUPRAPUBIC CATHETER N/A 04/18/2018   Procedure: SUPRAPUBIC TUBE CHANGE;  Surgeon: Bjorn Pippin, MD;  Location: [facility];  Service: Urology;  Laterality: N/A;   IR CATHETER TUBE CHANGE  02/24/2018   LUMBAR LAMINECTOMY/DECOMPRESSION MICRODISCECTOMY Right 03/31/2021   Procedure: Bilateral Lumbar Three- Four, Lumbar Four-Five Laminectomy, Decompression;  Surgeon: Coletta Memos, MD;  Location: [facility];  Service: Neurosurgery;  Laterality: Right;   SPACE OAR INSTILLATION N/A 09/05/2017   Procedure: SPACE OAR INSTILLATION;  Surgeon: Bjorn Pippin, MD;  Location: [facility];  Service: Urology;  Laterality: N/A;   TOTAL ANKLE ARTHROPLASTY Right 04/20/2015   Procedure: TOTAL ANKLE ARTHOPLASTY;  Surgeon: Nadara Mustard, MD;  Location: [facility];  Service: Orthopedics;  Laterality: Right;   TRANSTHORACIC ECHOCARDIOGRAM  11/16/2012   ef 55-60%,  grade 1 diastolic dysfunction/  mild dilated ascending aorta/  mild MR/ trivial TR   TRANSURETHRAL RESECTION OF PROSTATE N/A 02/03/2019   Procedure:  TRANSURETHRAL RESECTION OF THE PROSTATE (TURP);  Surgeon: Bjorn Pippin, MD;  Location: [facility];  Service: Urology;  Laterality: N/A;   VIDEO ASSISTED THORACOSCOPY (VATS)/ LOBECTOMY Right 07/04/2015   Procedure: VIDEO ASSISTED THORACOSCOPY (VATS)/ RIGHT UPPER LOBECTOMY;  Surgeon: Loreli Slot, MD;  Location: [facility] OR;  Service: Thoracic;  Laterality: Right;   VIDEO BRONCHOSCOPY N/A 06/17/2015   Procedure: VIDEO BRONCHOSCOPY;  Surgeon: Loreli Slot, MD;  Location: [facility] OR;  Service: Thoracic;  Laterality: N/A;   VIDEO BRONCHOSCOPY WITH ENDOBRONCHIAL NAVIGATION N/A 06/17/2015   Procedure: VIDEO BRONCHOSCOPY WITH ENDOBRONCHIAL NAVIGATION;  Surgeon: Loreli Slot, MD;  [facility];  Service: Thoracic;  Laterality: N/A;   VIDEO BRONCHOSCOPY WITH ENDOBRONCHIAL ULTRASOUND N/A 06/17/2015   Procedure: VIDEO BRONCHOSCOPY WITH ENDOBRONCHIAL ULTRASOUND;  Surgeon: Loreli Slot, MD;  Location: [facility] OR;  Service: Thoracic;  Laterality: N/A;     Allergies: Ampicillin and Levaquin [levofloxacin]  Medications: Prior to Admission medications   Medication Sig Start Date End Date Taking? Authorizing Provider  albuterol (PROVENTIL) (2.5 MG/3ML) 0.083% nebulizer solution Take 3 mLs (2.5 mg total) by nebulization every 6 (six) hours as needed for shortness of breath. 04/06/21  Yes Eber Hong, MD  atenolol (TENORMIN) 25 MG tablet Take 1 tablet by mouth daily.   Yes [provider]  atorvastatin (LIPITOR) 20 MG tablet Take 20 mg by mouth daily. 10/05/21  Yes [provider]  diclofenac Sodium (VOLTAREN) 1 % GEL Apply 2 g topically 4 (four) times daily as needed (pain).   Yes [provider]  Docusate Sodium (DSS) 100 MG CAPS Take 100 mg by mouth daily.   Yes [provider]  doxycycline (MONODOX) 100 MG capsule Take 100 mg by mouth 2 (two) times daily. 11/05/22  Yes [provider]  escitalopram (LEXAPRO) 5 MG tablet Take 5 mg by mouth daily.   Yes  [provider]  famotidine (PEPCID) 20 MG tablet Take by mouth. 11/30/22  Yes [provider]  gabapentin (NEURONTIN) 800 MG tablet Take 800 mg by mouth daily.   Yes [provider]  latanoprost (XALATAN) 0.005 % ophthalmic solution Place 1 drop into both eyes at bedtime. 01/26/21  Yes [provider]  LINZESS 72 MCG capsule Take 72 mcg by mouth daily before breakfast. 05/21/19  Yes [provider]  meclizine (ANTIVERT) 12.5 MG tablet Take 6.25-12.5 mg by mouth every 6 (six) hours as needed for dizziness. 02/16/21  Yes [provider]  meloxicam (MOBIC) 7.5 MG tablet Take 1 tablet (7.5 mg total) by mouth daily. 04/13/21  Yes Caccavale, Sophia, PA-C  methocarbamol (ROBAXIN) 500 MG tablet Take 1 tablet (500 mg total) by mouth 2 (two) times daily as needed for muscle spasms. 04/13/21  Yes Caccavale, Sophia, PA-C  metoCLOPramide (REGLAN) 5 MG tablet Take 5 mg by mouth daily as needed for vomiting or nausea.   Yes [provider]  mupirocin cream (BACTROBAN) 2 % Apply 1 application topically 2 (two) times daily as needed (wound care).   Yes [provider]  Oxycodone HCl 20 MG TABS Take 5 mg by mouth See admin instructions. Takes 1/4 of a pill 4 times a day 02/02/18  Yes [provider]  pantoprazole (PROTONIX) 40 MG tablet Take 40 mg by mouth daily. 12/20/22  Yes [provider]  potassium chloride SA (KLOR-CON M) 20 MEQ tablet Take 1 tablet (20 mEq total) by mouth 2 (two) times daily. 12/26/22  Yes Laurence Spates, MD  predniSONE (DELTASONE) 20 MG tablet Take 20 mg by mouth daily as needed (unknown).   Yes [provider]  ROCKLATAN 0.02-0.005 % SOLN Place 1 drop into both eyes at bedtime. 02/22/21  Yes [provider]  scopolamine (TRANSDERM-SCOP) 1 MG/3DAYS 1 patch every 3 (three) days. 09/07/22  Yes [provider]  Simethicone 180 MG CAPS Take by mouth as needed. 12/03/22 01/02/23 Yes  [provider]  SPIRIVA HANDIHALER 18 MCG inhalation capsule Place 18 mcg into inhaler and inhale as needed. 03/16/20  Yes [provider]  sucralfate (CARAFATE) 1 GM/10ML suspension Take 1 g by mouth 2 (two) times daily. 06/09/22  Yes [provider]  tamsulosin (FLOMAX) 0.4 MG CAPS capsule Take 1 capsule (0.4 mg total) by mouth daily. 02/01/22  Yes Bjorn Pippin, MD  timolol (TIMOPTIC) 0.5 % ophthalmic solution Place 1 drop into  both eyes 2 (two) times daily as needed (eye pressure).   Yes [provider]  atenolol-chlorthalidone (TENORETIC) 50-25 MG tablet Take 1 tablet by mouth at bedtime.  Patient not taking: Reported on 12/27/2022 12/03/18   [provider]  capsaicin (ZOSTRIX) 0.025 % cream Apply 1 application topically daily as needed (Skin Rash). Patient not taking: Reported on 12/27/2022 02/09/20   [provider]  donepezil (ARICEPT) 10 MG tablet Take 10 mg by mouth daily. Patient not taking: Reported on 12/27/2022 09/10/22   [provider]  doxycycline (VIBRAMYCIN) 100 MG capsule Take 1 capsule (100 mg total) by mouth every 12 (twelve) hours. Patient not taking: Reported on 12/27/2022 07/26/22   Bjorn Pippin, MD  FREESTYLE LITE test strip  09/02/19   [provider]  hydroquinone 4 % cream Apply 1 application topically daily as needed (Skin Rash). Patient not taking: Reported on 12/27/2022    [provider]  ibuprofen (ADVIL) 600 MG tablet Take 600 mg by mouth every 6 (six) hours as needed for headache. Patient not taking: Reported on 12/27/2022 05/26/19   [provider]  Incontinence Supply Disposable ([employer] MENS GUARD) MISC 1 Pad by Does not apply route 2 (two) times daily. 07/12/22   Bjorn Pippin, MD  Incontinence Supply Disposable (DEPEND REAL FIT/BRIEF/MEN/L-XL) MISC 1 Pad by Does not apply route in the morning and at bedtime. 07/12/22   Bjorn Pippin, MD  Lancets (FREESTYLE) lancets  09/02/19   [provider]  LANTUS SOLOSTAR 100 UNIT/ML Solostar Pen Inject 2.5 Units into the skin at bedtime. Patient not taking: Reported on 12/27/2022 11/28/17   [provider]  lidocaine (LIDODERM) 5 % Place 1 patch onto the skin daily. Remove & Discard patch within 12 hours or as directed by MD Patient not taking: Reported on 12/27/2022 04/13/21   Caccavale, Sophia, PA-C  mirabegron ER (MYRBETRIQ) 25 MG TB24 tablet Take 25 mg by mouth. Patient not taking: Reported on 12/27/2022    [provider]  mupirocin ointment (BACTROBAN) 2 % Apply 1 application topically 2 (two) times daily as needed (Rash). Patient not taking: Reported on 12/27/2022 09/10/19   [provider]  nystatin ointment (MYCOSTATIN) 1 application daily as needed (Skin Rash). Patient not taking: Reported on 12/27/2022 09/03/19   [provider]  nystatin-triamcinolone ointment (MYCOLOG) Apply 1 application topically 2 (two) times daily. To groins Patient not taking: Reported on 12/27/2022 09/11/19   Bjorn Pippin, MD  pantoprazole (PROTONIX) 20 MG tablet Take 20 mg by mouth daily as needed for heartburn or indigestion. Patient not taking: Reported on 12/27/2022    [provider]  psyllium (METAMUCIL SMOOTH TEXTURE) 58.6 % powder Take 1 packet by mouth daily. Patient not taking: Reported on 12/27/2022 04/27/19   Malissa Hippo, MD  RHOPRESSA 0.02 % SOLN Place 1 drop into the right eye at bedtime. Patient not taking: Reported on 12/27/2022 01/26/21   [provider]  terbinafine (LAMISIL) 250 MG tablet Take 250 mg by mouth 2 (two) times daily. Patient not taking: Reported on 12/27/2022 09/03/19   [provider]  VIAGRA 100 MG tablet Take 1 tablet (100 mg total) by mouth daily as needed for erectile dysfunction. Patient not taking: Reported on 12/27/2022 07/03/22   Bjorn Pippin, MD     Vital Signs: BP 95/60 (BP Location: Left Arm)   Pulse 65   Temp 98.4 F (36.9 C) (Oral)   Resp 17   Ht  6\' 8"  (2.032 m)  Wt 213 lb 13.5 oz (97 kg)   SpO2 98%   BMI 23.49 kg/m   Physical Exam awake/alert; chest- CTA bilat; heart- RRR; abd- soft,+BS, some lower abd tenderness to palpation, no LE edema  Imaging: CT ABDOMEN PELVIS W CONTRAST  Result Date: 12/26/2022 CLINICAL DATA:  Acute nonlocalized abdominal pain. Hiatal hernia with abdominal soreness and nausea for 2 weeks. EXAM: CT ABDOMEN AND PELVIS WITH CONTRAST TECHNIQUE: Multidetector CT imaging of the abdomen and pelvis was performed using the standard protocol following bolus administration of intravenous contrast. RADIATION DOSE REDUCTION: This exam was performed according to the departmental dose-optimization program which includes automated exposure control, adjustment of the mA and/or kV according to patient size and/or use of iterative reconstruction technique. CONTRAST:  OMNIPAQUE IOHEXOL 300 MG/ML  SOLN COMPARISON:  12/24/2022 FINDINGS: Lower chest: Lung bases are clear. Hepatobiliary: No focal liver abnormality is seen. Status post cholecystectomy. No biliary dilatation. Pancreas: Unremarkable. No pancreatic ductal dilatation or surrounding inflammatory changes. Spleen: 1.4 cm diameter and subcentimeter low-attenuation lesions in the spleen likely representing cysts or hemangioma. Adrenals/Urinary Tract: No adrenal gland nodules. Multiple renal cysts. Largest on the left measures 3.6 cm diameter. No imaging follow-up is indicated. No hydronephrosis or hydroureter. Bladder is normal. Stomach/Bowel: Stomach is within normal limits. Appendix appears normal. No evidence of bowel wall thickening, distention, or inflammatory changes. Vascular/Lymphatic: Calcific and noncalcific abdominal aortic atherosclerosis. Abdominal aortic aneurysm measuring 3.9 cm diameter, unchanged. No dissection. No significant lymphadenopathy. Reproductive: Seed implants in the prostate. Other: No free air or free fluid in the abdomen. Abdominal wall musculature  appears intact. Musculoskeletal: Degenerative changes in the spine and hips. Focal destructive bone lesion in the right iliac bone with multiple smaller scattered lucent lesions throughout the pelvis lesions are unchanged since prior study but suspicious for metastatic disease. IMPRESSION: 1. No evidence of bowel obstruction or inflammation. 2. 3.9 cm diameter abdominal aortic aneurysm. Recommend follow-up ultrasound every 3 years. (Ref.: J Vasc Surg. 2018; 67:2-77 and J Am Coll Radiol 2013;10(10):789-794.) 3. Aortic atherosclerosis. 4. Lucent bone lesions, most prominently a destructive bone lesion in the right iliac bone. Possible metastatic disease. Electronically Signed   By: Burman Nieves M.D.   On: 12/26/2022 22:13    Labs:  CBC: Recent Labs    12/26/22 1225 12/27/22 0640 12/28/22 0405  WBC 10.5 9.1 8.2  HGB 11.7* 11.3* 10.6*  HCT 37.6* 35.8* 34.4*  PLT 253 238 249    COAGS: No results for input(s): "INR", "APTT" in the last 8760 hours.  BMP: Recent Labs    12/26/22 1225 12/27/22 0640 12/28/22 0405  NA 137 136 133*  K 3.1* 3.8 3.3*  CL 101 98 98  CO2 29 27 25   GLUCOSE 111* 103* 97  BUN 14 14 13   CALCIUM 8.7* 8.4* 8.0*  CREATININE 1.27* 1.13 1.13  GFRNONAA 57* >60 >60    LIVER FUNCTION TESTS: Recent Labs    12/26/22 1225 12/28/22 0405  BILITOT 0.7 0.9  AST 16 12*  ALT 14 12  ALKPHOS 77 60  PROT 8.0 6.9  ALBUMIN 3.1* 2.7*    Assessment and Plan: 82 yo male with PMH sig for prostate cancer status post XRT and ADT and cancer of the right lung status post lobectomy and left upper lobe nodule status post SVR in 2017, history of CKD stage IIIa, hypertension, COPD, memory loss and a history of episode of rapid atrial fibrillation following his right upper lobe lobectomy who presented with abdominal discomfort and loss  of appetite. He presented to [facility] recently with multiple vague complaints including abdominal discomfort and generalized weakness. CT A/P on 10/2  revealed:   1. No evidence of bowel obstruction or inflammation. 2. 3.9 cm diameter abdominal aortic aneurysm. Recommend follow-up ultrasound every 3 years. (Ref.: J Vasc Surg. 2018; 67:2-77 and J Am Coll Radiol 2013;10(10):789-794.) 3. Aortic atherosclerosis. 4. Lucent bone lesions, most prominently a destructive bone lesion in the right iliac bone. Possible metastatic disease.  Request now received for image guided rt iliac bone lesion bx. Imaging studies were reviewed by Dr. Grace Isaac.Risks and benefits of procedure was discussed with the patient including, but not limited to bleeding, infection, damage to adjacent structures or low yield requiring additional tests.  All of the questions were answered and there is agreement to proceed.  Consent signed and in chart. Procedure tent planned for today    Electronically Signed: D. Jeananne Rama, PA-C 12/28/2022, 11:00 AM   I spent a total of 25 Minutes at the the patient's bedside AND on the patient's hospital floor or unit, greater than 50% of which was counseling/coordinating care for CT guided right iliac bone lesion biopsy    Patient ID: Jonathan [name], male   DOB: October 31, 1940, 82 y.o.   MRN: [id number]

## 2022-12-28 NOTE — Procedures (Signed)
Pre procedural Dx: Right iliac bone lesion Post procedural Dx: Same  Technically successful CT guided biopsy of infiltrative right iliac lesion.   EBL: None.   Complications: None immediate.   Katherina Right, MD Pager #: [phone]

## 2022-12-28 NOTE — Progress Notes (Signed)
PHARMACY - ANTICOAGULATION CONSULT NOTE  Pharmacy Consult for IV heparin Indication: atrial fibrillation  Allergies  Allergen Reactions   Ampicillin Other (See Comments)    Don't work   Levaquin [Levofloxacin] Other (See Comments)    Doesn't work    Patient Measurements: Height: 6\' 8"  (203.2 cm) Weight: 97 kg (213 lb 13.5 oz) IBW/kg (Calculated) : 96 Heparin Dosing Weight: 97 kg   Vital Signs: Temp: 98.4 F (36.9 C) (10/04 0458) Temp Source: Oral (10/04 0458) BP: 99/52 (10/04 0458) Pulse Rate: 69 (10/04 0458)  Labs: Recent Labs    12/26/22 1225 12/26/22 2201 12/27/22 0640 12/27/22 1030 12/27/22 1641 12/28/22 0405  HGB 11.7*  --  11.3*  --   --  10.6*  HCT 37.6*  --  35.8*  --   --  34.4*  PLT 253  --  238  --   --  249  CREATININE 1.27*  --  1.13  --   --  1.13  TROPONINIHS  --  6  --  9 10  --     Estimated Creatinine Clearance: 70.8 mL/min (by C-G formula based on SCr of 1.13 mg/dL).   Medical History: Past Medical History:  Diagnosis Date   Acquired bladder diverticulum    12/ 2012  resection diverticulum done at [facility] in [place], [name]   Age-related cataract of right eye    scheduled for catarat extraction 09-12-2017   Agent orange exposure    Aneurysm of infrarenal abdominal aorta (HCC)    first dx 2017--- last CT 06-11-2017  measures 3.5cm   Anxiety    Chronic constipation    COPD with emphysema (HCC)    06--12-2017  per pt no symptoms since Feb 2019   Depression    PTSD   Diabetes mellitus type 2, diet-controlled (HCC)    Diabetic neuropathy (HCC)    Dyslipidemia    Erectile dysfunction    Feeling of incomplete bladder emptying    Gait abnormality 01/06/2020   GERD (gastroesophageal reflux disease)    Gout    09-02-2017 last flare up 3 months ago   Hiatal hernia    History of atrial fibrillation    episode post op right lung lobectomy 07-04-2015   History of bladder stone    History of colon polyps    History of DVT of lower extremity     03/ 2019  bilateral lower extremity (superficial) ---  per treated w/ oral medication    History of gastritis    History of kidney stones    History of radiation therapy 09-14-2015  to 09-21-2015   left upper lung nodule -- 54Gy in 3 fractions (18 Gy per fraction)   Hyperplasia of prostate with lower urinary tract symptoms (LUTS)    Hypertension    Lumbar spinal stenosis    Nephrolithiasis    Neurogenic bladder    Osteoarthritis    ankle, hands   Osteoporosis    Prostate cancer [facility]) urologist-  dr wrenn/  oncologist-  dr Kathrynn Running   dx 05-24-2017-- Stage T2b,  Gleason 4+4,  PSA 22.41,  vol 38cc--- started ADT 04/ 2019,  plan external radiation therapy   Radiation fibrosis of lung (HCC)    hx left upper long nodule SBRT 06/ 2017   Squamous cell carcinoma of both lungs (HCC) last CT in epic dated 06-26-2017 no recurrence   dx 03/ 2017  non-small cell SCC via bronchoscopy w/ bx's by dr Dorris Fetch---  s/p  right VATS w/ right lobectomy  and node dissection's 07-04-2015 /  pt had SBRT to left upper nodule Stage 1 ([facility]) completed 09-21-2015   Vertigo 2018   Wears dentures    bottom   Wears glasses     Medications:  Medications Prior to Admission  Medication Sig Dispense Refill Last Dose   albuterol (PROVENTIL) (2.5 MG/3ML) 0.083% nebulizer solution Take 3 mLs (2.5 mg total) by nebulization every 6 (six) hours as needed for shortness of breath. 75 mL 1 Unknown   atenolol (TENORMIN) 25 MG tablet Take 1 tablet by mouth daily.   12/26/2022   atorvastatin (LIPITOR) 20 MG tablet Take 20 mg by mouth daily.   12/26/2022   diclofenac Sodium (VOLTAREN) 1 % GEL Apply 2 g topically 4 (four) times daily as needed (pain).   Unknown   Docusate Sodium (DSS) 100 MG CAPS Take 100 mg by mouth daily.   Unknown   doxycycline (MONODOX) 100 MG capsule Take 100 mg by mouth 2 (two) times daily.   12/26/2022   escitalopram (LEXAPRO) 5 MG tablet Take 5 mg by mouth daily.   12/26/2022   famotidine  (PEPCID) 20 MG tablet Take by mouth.   12/26/2022   gabapentin (NEURONTIN) 800 MG tablet Take 800 mg by mouth daily.   12/26/2022   latanoprost (XALATAN) 0.005 % ophthalmic solution Place 1 drop into both eyes at bedtime.   12/26/2022   LINZESS 72 MCG capsule Take 72 mcg by mouth daily before breakfast.   12/19/2022   meclizine (ANTIVERT) 12.5 MG tablet Take 6.25-12.5 mg by mouth every 6 (six) hours as needed for dizziness.   Unknown   meloxicam (MOBIC) 7.5 MG tablet Take 1 tablet (7.5 mg total) by mouth daily. 14 tablet 0 Unknown   methocarbamol (ROBAXIN) 500 MG tablet Take 1 tablet (500 mg total) by mouth 2 (two) times daily as needed for muscle spasms. 20 tablet 0 Unknown   metoCLOPramide (REGLAN) 5 MG tablet Take 5 mg by mouth daily as needed for vomiting or nausea.   Unknown   mupirocin cream (BACTROBAN) 2 % Apply 1 application topically 2 (two) times daily as needed (wound care).   Unknown   Oxycodone HCl 20 MG TABS Take 5 mg by mouth See admin instructions. Takes 1/4 of a pill 4 times a day  0 Unknown   pantoprazole (PROTONIX) 40 MG tablet Take 40 mg by mouth daily.   12/26/2022   predniSONE (DELTASONE) 20 MG tablet Take 20 mg by mouth daily as needed (unknown).   Unknown   ROCKLATAN 0.02-0.005 % SOLN Place 1 drop into both eyes at bedtime.   12/26/2022   scopolamine (TRANSDERM-SCOP) 1 MG/3DAYS 1 patch every 3 (three) days.   Unknown   Simethicone 180 MG CAPS Take by mouth as needed.   Unknown   SPIRIVA HANDIHALER 18 MCG inhalation capsule Place 18 mcg into inhaler and inhale as needed.   Unknown   sucralfate (CARAFATE) 1 GM/10ML suspension Take 1 g by mouth 2 (two) times daily.   12/26/2022   tamsulosin (FLOMAX) 0.4 MG CAPS capsule Take 1 capsule (0.4 mg total) by mouth daily. 90 capsule 3 12/26/2022   timolol (TIMOPTIC) 0.5 % ophthalmic solution Place 1 drop into both eyes 2 (two) times daily as needed (eye pressure).   12/26/2022   atenolol-chlorthalidone (TENORETIC) 50-25 MG tablet Take 1 tablet  by mouth at bedtime.  (Patient not taking: Reported on 12/27/2022)   Not Taking   capsaicin (ZOSTRIX) 0.025 % cream Apply 1 application  topically daily as needed (Skin Rash). (Patient not taking: Reported on 12/27/2022)   Not Taking   donepezil (ARICEPT) 10 MG tablet Take 10 mg by mouth daily. (Patient not taking: Reported on 12/27/2022)   Not Taking   doxycycline (VIBRAMYCIN) 100 MG capsule Take 1 capsule (100 mg total) by mouth every 12 (twelve) hours. (Patient not taking: Reported on 12/27/2022) 14 capsule 5 Not Taking   FREESTYLE LITE test strip       hydroquinone 4 % cream Apply 1 application topically daily as needed (Skin Rash). (Patient not taking: Reported on 12/27/2022)   Not Taking   ibuprofen (ADVIL) 600 MG tablet Take 600 mg by mouth every 6 (six) hours as needed for headache. (Patient not taking: Reported on 12/27/2022)   Not Taking   Incontinence Supply Disposable ([employer] MENS GUARD) MISC 1 Pad by Does not apply route 2 (two) times daily. 60 each 11    Incontinence Supply Disposable (DEPEND REAL FIT/BRIEF/MEN/L-XL) MISC 1 Pad by Does not apply route in the morning and at bedtime. 60 each 11    Lancets (FREESTYLE) lancets       LANTUS SOLOSTAR 100 UNIT/ML Solostar Pen Inject 2.5 Units into the skin at bedtime. (Patient not taking: Reported on 12/27/2022)   Not Taking   lidocaine (LIDODERM) 5 % Place 1 patch onto the skin daily. Remove & Discard patch within 12 hours or as directed by MD (Patient not taking: Reported on 12/27/2022) 30 patch 0 Not Taking   mirabegron ER (MYRBETRIQ) 25 MG TB24 tablet Take 25 mg by mouth. (Patient not taking: Reported on 12/27/2022)   Not Taking   mupirocin ointment (BACTROBAN) 2 % Apply 1 application topically 2 (two) times daily as needed (Rash). (Patient not taking: Reported on 12/27/2022)   Not Taking   nystatin ointment (MYCOSTATIN) 1 application daily as needed (Skin Rash). (Patient not taking: Reported on 12/27/2022)   Not Taking   nystatin-triamcinolone ointment  (MYCOLOG) Apply 1 application topically 2 (two) times daily. To groins (Patient not taking: Reported on 12/27/2022) 30 g 11 Not Taking   pantoprazole (PROTONIX) 20 MG tablet Take 20 mg by mouth daily as needed for heartburn or indigestion. (Patient not taking: Reported on 12/27/2022)   Not Taking   psyllium (METAMUCIL SMOOTH TEXTURE) 58.6 % powder Take 1 packet by mouth daily. (Patient not taking: Reported on 12/27/2022)   Not Taking   RHOPRESSA 0.02 % SOLN Place 1 drop into the right eye at bedtime. (Patient not taking: Reported on 12/27/2022)   Not Taking   terbinafine (LAMISIL) 250 MG tablet Take 250 mg by mouth 2 (two) times daily. (Patient not taking: Reported on 12/27/2022)   Not Taking   VIAGRA 100 MG tablet Take 1 tablet (100 mg total) by mouth daily as needed for erectile dysfunction. (Patient not taking: Reported on 12/27/2022) 30 tablet 6 Not Taking    Assessment: Pharmacy is consulted to start heparin on 82 yo male diagnosed with atrial fibrillation. Pt had been started on Eliquis on 10/3 for new onset atrial fibrillation with last dose being on 10/3 at 2139. Eliquis is now to be held as pt will need a lesion biopsy and will need an Eliquis washout.   Per IR consult, post-procedure bleeding risk is standard. Will resume heparin drip 6 hours after end of procedure   Goal of Therapy:  aPTT 66-102 sec HL 0.3-0.7 Monitor platelets by anticoagulation protocol: Yes   Plan:  Due to recent Eliquis dose, will omit bolus Starting  at 1830, Start heparin drip at 1450 units/hr  Obtain HL/aPTT 8 hours after start of infusion Daily CBC while on heparin drip Monitor for signs and symptoms of bleeding    Adalberto Cole, PharmD, BCPS 12/28/2022 9:54 AM

## 2022-12-28 NOTE — Consult Note (Signed)
Subjective:    Consult requested by Dr. Marguerita Merles  12/28/22: [name] is an 82 yo male who I was asked to see for a destructive bone lesion in the right iliac bone on a CT this admission.   He had a lumber MRI in September at [facility] and the lesion was present and there is about a 4cm soft tissue component. There was no evidence of the lesion on a PET scan in 9/23.  He has had some pain in that area for about 2 months and has some numbness and weakness in the right leg.  He was admitted with abdominal pain with anorexia and was also found to have afib with RVR.  He has had some nausea and weight loss.  He has SOB and is on home O2.  He is voiding ok with a reduced stream.  He has constipation managed with Linzess.     I have followed for many years for a variety of urologic issues including prostate cancer that was previously treated with ADT and EXRT in 2019.  He also has a history of Squamous cell ca of the lung with right lobectomy, left upper lobe nodule treated with SBR in 2017.   His PSA in 4/24 was <0.1 with a testosterone of 413.[name]  He has a history of a bladder diverticulum that was resected remotely and a history of a neurogenic bladder with retention.  He had a TURP in 2020.   He has had renal and bladder stones and recurrent UTI's in the past and had been on PTNS therapy for urge incontinence.     ROS:  Review of Systems  Constitutional:  Positive for weight loss.  Respiratory:  Positive for shortness of breath.   Gastrointestinal:  Positive for constipation.  Genitourinary:  Negative for flank pain and hematuria.  Musculoskeletal:  Positive for joint pain.  Neurological:  Positive for sensory change and weakness.  Psychiatric/Behavioral:  Positive for memory loss.   All other systems reviewed and are negative.   Allergies  Allergen Reactions   Ampicillin Other (See Comments)    Don't work   Levaquin [Levofloxacin] Other (See Comments)    Doesn't work    Past Medical  History:  Diagnosis Date   Acquired bladder diverticulum    12/ 2012  resection diverticulum done at [facility] in [place], [name]   Age-related cataract of right eye    scheduled for catarat extraction 09-12-2017   Agent orange exposure    Aneurysm of infrarenal abdominal aorta (HCC)    first dx 2017--- last CT 06-11-2017  measures 3.5cm   Anxiety    Chronic constipation    COPD with emphysema (HCC)    06--12-2017  per pt no symptoms since Feb 2019   Depression    PTSD   Diabetes mellitus type 2, diet-controlled (HCC)    Diabetic neuropathy (HCC)    Dyslipidemia    Erectile dysfunction    Feeling of incomplete bladder emptying    Gait abnormality 01/06/2020   GERD (gastroesophageal reflux disease)    Gout    09-02-2017 last flare up 3 months ago   Hiatal hernia    History of atrial fibrillation    episode post op right lung lobectomy 07-04-2015   History of bladder stone    History of colon polyps    History of DVT of lower extremity    03/ 2019  bilateral lower extremity (superficial) ---  per treated w/ oral medication    History of  gastritis    History of kidney stones    History of radiation therapy 09-14-2015  to 09-21-2015   left upper lung nodule -- 54Gy in 3 fractions (18 Gy per fraction)   Hyperplasia of prostate with lower urinary tract symptoms (LUTS)    Hypertension    Lumbar spinal stenosis    Nephrolithiasis    Neurogenic bladder    Osteoarthritis    ankle, hands   Osteoporosis    Prostate cancer [facility]) urologist-  dr Montie Gelardi/  oncologist-  dr Kathrynn Running   dx 05-24-2017-- Stage T2b,  Gleason 4+4,  PSA 22.41,  vol 38cc--- started ADT 04/ 2019,  plan external radiation therapy   Radiation fibrosis of lung (HCC)    hx left upper long nodule SBRT 06/ 2017   Squamous cell carcinoma of both lungs (HCC) last CT in epic dated 06-26-2017 no recurrence   dx 03/ 2017  non-small cell SCC via bronchoscopy w/ bx's by dr Dorris Fetch---  s/p  right VATS w/ right lobectomy and node  dissection's 07-04-2015 /  pt had SBRT to left upper nodule Stage 1 ([facility]) completed 09-21-2015   Vertigo 2018   Wears dentures    bottom   Wears glasses     Past Surgical History:  Procedure Laterality Date   BIOPSY  05/29/2019   Procedure: BIOPSY;  Surgeon: Malissa Hippo, MD;  [facility];  Service: Endoscopy;;  gastric ulcer   CARPAL TUNNEL RELEASE Right 07/09/2014   Procedure: RIGHT OPEN CARPAL TUNNEL RELEASE;  Surgeon: Kathryne Hitch, MD;  Location: [facility];  Service: Orthopedics;  Laterality: Right;   CHOLECYSTECTOMY N/A 02/24/2014   Procedure: LAPAROSCOPIC CHOLECYSTECTOMY;  Surgeon: Abigail Miyamoto, MD;  Location: [facility];  Service: General;  Laterality: N/A;   COLONOSCOPY     CYSTOLITHOT[facility]Y  11/ [facility] [place]   CYSTOSCOPY N/A 04/18/2018   Procedure: CYSTOSCOPY FLEXIBLE;  Surgeon: Bjorn Pippin, MD;  Location: [facility];  Service: Urology;  Laterality: N/A;   CYSTOSCOPY W/ LITHOLAPAXY / EHL  02-24-2007   dr Brunilda Payor  [facility]   dental implant     No teeth at this time waiting for them to be made as of 01-30-19   ESOPHAGOGASTRODUODENOSCOPY (EGD) WITH PROPOFOL N/A 05/29/2019   Procedure: ESOPHAGOGASTRODUODENOSCOPY (EGD) WITH PROPOFOL;  Surgeon: Malissa Hippo, MD;  [facility];  Service: Endoscopy;  Laterality: N/A;  925   GOLD SEED IMPLANT N/A 09/05/2017   Procedure: GOLD SEED IMPLANT;  Surgeon: Bjorn Pippin, MD;  Location: [facility];  Service: Urology;  Laterality: N/A;   INSERTION OF SUPRAPUBIC CATHETER N/A 04/18/2018   Procedure: SUPRAPUBIC TUBE CHANGE;  Surgeon: Bjorn Pippin, MD;  Location: [facility];  Service: Urology;  Laterality: N/A;   IR CATHETER TUBE CHANGE  02/24/2018   LUMBAR LAMINECTOMY/DECOMPRESSION MICRODISCECTOMY Right 03/31/2021   Procedure: Bilateral Lumbar Three- Four, Lumbar Four-Five Laminectomy, Decompression;  Surgeon: Coletta Memos, MD;  Location: [facility];  Service: Neurosurgery;  Laterality:  Right;   SPACE OAR INSTILLATION N/A 09/05/2017   Procedure: SPACE OAR INSTILLATION;  Surgeon: Bjorn Pippin, MD;  Location: [facility];  Service: Urology;  Laterality: N/A;   TOTAL ANKLE ARTHROPLASTY Right 04/20/2015   Procedure: TOTAL ANKLE ARTHOPLASTY;  Surgeon: Nadara Mustard, MD;  Location: [facility];  Service: Orthopedics;  Laterality: Right;   TRANSTHORACIC ECHOCARDIOGRAM  11/16/2012   ef 55-60%,  grade 1 diastolic dysfunction/  mild dilated ascending  aorta/  mild MR/ trivial TR   TRANSURETHRAL RESECTION OF PROSTATE N/A 02/03/2019   Procedure: TRANSURETHRAL RESECTION OF THE PROSTATE (TURP);  Surgeon: Bjorn Pippin, MD;  Location: [facility];  Service: Urology;  Laterality: N/A;   VIDEO ASSISTED THORACOSCOPY (VATS)/ LOBECTOMY Right 07/04/2015   Procedure: VIDEO ASSISTED THORACOSCOPY (VATS)/ RIGHT UPPER LOBECTOMY;  Surgeon: Loreli Slot, MD;  Location: [facility] OR;  Service: Thoracic;  Laterality: Right;   VIDEO BRONCHOSCOPY N/A 06/17/2015   Procedure: VIDEO BRONCHOSCOPY;  Surgeon: Loreli Slot, MD;  Location: [facility] OR;  Service: Thoracic;  Laterality: N/A;   VIDEO BRONCHOSCOPY WITH ENDOBRONCHIAL NAVIGATION N/A 06/17/2015   Procedure: VIDEO BRONCHOSCOPY WITH ENDOBRONCHIAL NAVIGATION;  Surgeon: Loreli Slot, MD;  [facility];  Service: Thoracic;  Laterality: N/A;   VIDEO BRONCHOSCOPY WITH ENDOBRONCHIAL ULTRASOUND N/A 06/17/2015   Procedure: VIDEO BRONCHOSCOPY WITH ENDOBRONCHIAL ULTRASOUND;  Surgeon: Loreli Slot, MD;  Location: [facility];  Service: Thoracic;  Laterality: N/A;    Social History   Socioeconomic History   Marital status: Married    Spouse name: Rosine Door   Number of children: 2   Years of education: college   Highest education level: Not on file  Occupational History   Occupation: retired  Tobacco Use   Smoking status: Former    Current packs/day: 0.00    Average packs/day: 0.5 packs/day for 40.0 years (20.0 ttl pk-yrs)    Types: Cigarettes     Start date: 02/13/1975    Quit date: 02/13/2015    Years since quitting: 7.8   Smokeless tobacco: Never  Vaping Use   Vaping status: Never Used  Substance and Sexual Activity   Alcohol use: No    Alcohol/week: 0.0 standard drinks of alcohol   Drug use: No   Sexual activity: Not Currently  Other Topics Concern   Not on file  Social History Narrative   Lives with wife, daughter and son in law temporarily to help them out   Right Handed   Drinks caffeine occassionally   Social Determinants of Health   Financial Resource Strain: Low Risk  (11/05/2022)   Received from [facility]   Overall Financial Resource Strain (CARDIA)    Difficulty of Paying Living Expenses: Not hard at all  Food Insecurity: No Food Insecurity (12/27/2022)   Hunger Vital Sign    Worried About Running Out of Food in the Last Year: Never true    Ran Out of Food in the Last Year: Never true  Transportation Needs: No Transportation Needs (12/27/2022)   PRAPARE - Administrator, Civil Service (Medical): No    Lack of Transportation (Non-Medical): No  Physical Activity: Insufficiently Active (11/04/2022)   Received from [facility]   Exercise Vital Sign    Days of Exercise per Week: 3 days    Minutes of Exercise per Session: 30 min  Stress: No Stress Concern Present (11/04/2022)   Received from [facility] - Occupational Stress Questionnaire    Feeling of Stress : Only a little  Social Connections: Socially Integrated (11/04/2022)   Received from [facility]   Social Connection and Isolation Panel [NHANES]    Frequency of Communication with Friends and Family: More than three times a week    Frequency of Social Gatherings with Friends and Family: More than three times a week    Attends Religious Services: More than 4 times per year    Active Member  of Clubs or Organizations: Yes    Attends Banker Meetings: 1 to 4 times per year     Marital Status: Married  Catering manager Violence: Not At Risk (12/27/2022)   Humiliation, Afraid, Rape, and Kick questionnaire    Fear of Current or Ex-Partner: No    Emotionally Abused: No    Physically Abused: No    Sexually Abused: No    Family History  Problem Relation Age of Onset   Cancer Father        Asbestos   Colon cancer Neg Hx    Colon polyps Neg Hx    Kidney disease Neg Hx    Esophageal cancer Neg Hx    Gallbladder disease Neg Hx    Heart disease Neg Hx    Diabetes Neg Hx     Anti-infectives: Anti-infectives (From admission, onward)    None       Current Facility-Administered Medications  Medication Dose Route Frequency Provider Last Rate Last Admin   acetaminophen (TYLENOL) tablet 650 mg  650 mg Oral Q6H PRN Opyd, Lavone Neri, MD       Or   acetaminophen (TYLENOL) suppository 650 mg  650 mg Rectal Q6H PRN Opyd, Lavone Neri, MD       albuterol (PROVENTIL) (2.5 MG/3ML) 0.083% nebulizer solution 2.5 mg  2.5 mg Nebulization Q6H PRN Opyd, Lavone Neri, MD       apixaban (ELIQUIS) tablet 5 mg  5 mg Oral BID Opyd, Lavone Neri, MD   5 mg at 12/27/22 2139   atorvastatin (LIPITOR) tablet 20 mg  20 mg Oral Daily Opyd, Lavone Neri, MD   20 mg at 12/27/22 1025   feeding supplement (ENSURE ENLIVE / ENSURE PLUS) liquid 237 mL  237 mL Oral BID BM Sheikh, Omair Latif, DO       insulin aspart (novoLOG) injection 0-5 Units  0-5 Units Subcutaneous QHS Opyd, Lavone Neri, MD       insulin aspart (novoLOG) injection 0-6 Units  0-6 Units Subcutaneous TID WC Opyd, Lavone Neri, MD       latanoprost (XALATAN) 0.005 % ophthalmic solution 1 drop  1 drop Both Eyes QHS Anthoney Harada, NP   1 drop at 12/27/22 2213   metoprolol tartrate (LOPRESSOR) injection 5 mg  5 mg Intravenous Q2H PRN Opyd, Lavone Neri, MD       metoprolol tartrate (LOPRESSOR) tablet 25 mg  25 mg Oral BID Marjie Skiff E, PA-C   25 mg at 12/27/22 2139   ondansetron (ZOFRAN) tablet 4 mg  4 mg Oral Q6H PRN Opyd, Lavone Neri, MD        Or   ondansetron (ZOFRAN) injection 4 mg  4 mg Intravenous Q6H PRN Opyd, Lavone Neri, MD   4 mg at 12/27/22 0857   oxyCODONE (Oxy IR/ROXICODONE) immediate release tablet 5 mg  5 mg Oral Q6H PRN Opyd, Lavone Neri, MD   5 mg at 12/27/22 2139   pantoprazole (PROTONIX) EC tablet 40 mg  40 mg Oral Daily Opyd, Lavone Neri, MD   40 mg at 12/27/22 1023   polyethylene glycol (MIRALAX / GLYCOLAX) packet 17 g  17 g Oral Daily PRN Opyd, Lavone Neri, MD       sodium chloride flush (NS) 0.9 % injection 3 mL  3 mL Intravenous Q12H Opyd, Lavone Neri, MD   3 mL at 12/27/22 2214   sucralfate (CARAFATE) 1 GM/10ML suspension 1 g  1 g Oral BID Opyd, Lavone Neri, MD   1  g at 12/27/22 2139   timolol (TIMOPTIC) 0.5 % ophthalmic solution 1 drop  1 drop Both Eyes BID Anthoney Harada, NP   1 drop at 12/27/22 2213   umeclidinium bromide (INCRUSE ELLIPTA) 62.5 MCG/ACT 1 puff  1 puff Inhalation Daily Opyd, Lavone Neri, MD         Objective: Vital signs in last 24 hours: BP (!) 99/52 (BP Location: Left Arm)   Pulse 69   Temp 98.4 F (36.9 C) (Oral)   Resp 17   Ht 6\' 8"  (2.032 m)   Wt 97 kg   SpO2 98%   BMI 23.49 kg/m   Intake/Output from previous day: No intake/output data recorded. Intake/Output this shift: No intake/output data recorded.   Physical Exam Vitals reviewed.  Constitutional:      Appearance: He is well-developed.  Abdominal:     General: Abdomen is flat. There is no distension.     Palpations: Abdomen is soft.  Musculoskeletal:     Comments: No palpable mass of the right iliac area but some tenderness.   Skin:    General: Skin is warm and dry.  Neurological:     Mental Status: He is alert.     Lab Results:  Results for orders placed or performed during the hospital encounter of 12/26/22 (from the past 24 hour(s))  CBG monitoring, ED     Status: Abnormal   Collection Time: 12/27/22  8:42 AM  Result Value Ref Range   Glucose-Capillary 102 (H) 70 - 99 mg/dL  Troponin I (High Sensitivity)      Status: None   Collection Time: 12/27/22 10:30 AM  Result Value Ref Range   Troponin I (High Sensitivity) 9 <18 ng/L  CBG monitoring, ED     Status: Abnormal   Collection Time: 12/27/22 12:44 PM  Result Value Ref Range   Glucose-Capillary 101 (H) 70 - 99 mg/dL  Troponin I (High Sensitivity)     Status: None   Collection Time: 12/27/22  4:41 PM  Result Value Ref Range   Troponin I (High Sensitivity) 10 <18 ng/L  Glucose, capillary     Status: None   Collection Time: 12/27/22  5:12 PM  Result Value Ref Range   Glucose-Capillary 98 70 - 99 mg/dL  Glucose, capillary     Status: Abnormal   Collection Time: 12/27/22  8:45 PM  Result Value Ref Range   Glucose-Capillary 103 (H) 70 - 99 mg/dL  CBC with Differential/Platelet     Status: Abnormal   Collection Time: 12/28/22  4:05 AM  Result Value Ref Range   WBC 8.2 4.0 - 10.5 K/uL   RBC 3.75 (L) 4.22 - 5.81 MIL/uL   Hemoglobin 10.6 (L) 13.0 - 17.0 g/dL   HCT 91.4 (L) 78.2 - 95.6 %   MCV 91.7 80.0 - 100.0 fL   MCH 28.3 26.0 - 34.0 pg   MCHC 30.8 30.0 - 36.0 g/dL   RDW 21.3 08.6 - 57.8 %   Platelets 249 150 - 400 K/uL   nRBC 0.0 0.0 - 0.2 %   Neutrophils Relative % 72 %   Neutro Abs 5.9 1.7 - 7.7 K/uL   Lymphocytes Relative 17 %   Lymphs Abs 1.4 0.7 - 4.0 K/uL   Monocytes Relative 6 %   Monocytes Absolute 0.5 0.1 - 1.0 K/uL   Eosinophils Relative 5 %   Eosinophils Absolute 0.4 0.0 - 0.5 K/uL   Basophils Relative 0 %   Basophils Absolute 0.0 0.0 - 0.1  K/uL   Immature Granulocytes 0 %   Abs Immature Granulocytes 0.03 0.00 - 0.07 K/uL  Comprehensive metabolic panel     Status: Abnormal   Collection Time: 12/28/22  4:05 AM  Result Value Ref Range   Sodium 133 (L) 135 - 145 mmol/L   Potassium 3.3 (L) 3.5 - 5.1 mmol/L   Chloride 98 98 - 111 mmol/L   CO2 25 22 - 32 mmol/L   Glucose, Bld 97 70 - 99 mg/dL   BUN 13 8 - 23 mg/dL   Creatinine, Ser 1.61 0.61 - 1.24 mg/dL   Calcium 8.0 (L) 8.9 - 10.3 mg/dL   Total Protein 6.9 6.5 - 8.1  g/dL   Albumin 2.7 (L) 3.5 - 5.0 g/dL   AST 12 (L) 15 - 41 U/L   ALT 12 0 - 44 U/L   Alkaline Phosphatase 60 38 - 126 U/L   Total Bilirubin 0.9 0.3 - 1.2 mg/dL   GFR, Estimated >09 >60 mL/min   Anion gap 10 5 - 15  Magnesium     Status: None   Collection Time: 12/28/22  4:05 AM  Result Value Ref Range   Magnesium 2.1 1.7 - 2.4 mg/dL  Phosphorus     Status: None   Collection Time: 12/28/22  4:05 AM  Result Value Ref Range   Phosphorus 2.6 2.5 - 4.6 mg/dL  Glucose, capillary     Status: None   Collection Time: 12/28/22  7:47 AM  Result Value Ref Range   Glucose-Capillary 92 70 - 99 mg/dL    BMET Recent Labs    12/27/22 0640 12/28/22 0405  NA 136 133*  K 3.8 3.3*  CL 98 98  CO2 27 25  GLUCOSE 103* 97  BUN 14 13  CREATININE 1.13 1.13  CALCIUM 8.4* 8.0*   PT/INR No results for input(s): "LABPROT", "INR" in the last 72 hours. ABG No results for input(s): "PHART", "HCO3" in the last 72 hours.  Invalid input(s): "PCO2", "PO2"  Studies/Results: CT ABDOMEN PELVIS W CONTRAST  Result Date: 12/26/2022 CLINICAL DATA:  Acute nonlocalized abdominal pain. Hiatal hernia with abdominal soreness and nausea for 2 weeks. EXAM: CT ABDOMEN AND PELVIS WITH CONTRAST TECHNIQUE: Multidetector CT imaging of the abdomen and pelvis was performed using the standard protocol following bolus administration of intravenous contrast. RADIATION DOSE REDUCTION: This exam was performed according to the departmental dose-optimization program which includes automated exposure control, adjustment of the mA and/or kV according to patient size and/or use of iterative reconstruction technique. CONTRAST:  OMNIPAQUE IOHEXOL 300 MG/ML  SOLN COMPARISON:  12/24/2022 FINDINGS: Lower chest: Lung bases are clear. Hepatobiliary: No focal liver abnormality is seen. Status post cholecystectomy. No biliary dilatation. Pancreas: Unremarkable. No pancreatic ductal dilatation or surrounding inflammatory changes. Spleen: 1.4  cm diameter and subcentimeter low-attenuation lesions in the spleen likely representing cysts or hemangioma. Adrenals/Urinary Tract: No adrenal gland nodules. Multiple renal cysts. Largest on the left measures 3.6 cm diameter. No imaging follow-up is indicated. No hydronephrosis or hydroureter. Bladder is normal. Stomach/Bowel: Stomach is within normal limits. Appendix appears normal. No evidence of bowel wall thickening, distention, or inflammatory changes. Vascular/Lymphatic: Calcific and noncalcific abdominal aortic atherosclerosis. Abdominal aortic aneurysm measuring 3.9 cm diameter, unchanged. No dissection. No significant lymphadenopathy. Reproductive: Seed implants in the prostate. Other: No free air or free fluid in the abdomen. Abdominal wall musculature appears intact. Musculoskeletal: Degenerative changes in the spine and hips. Focal destructive bone lesion in the right iliac bone with multiple smaller  scattered lucent lesions throughout the pelvis lesions are unchanged since prior study but suspicious for metastatic disease. IMPRESSION: 1. No evidence of bowel obstruction or inflammation. 2. 3.9 cm diameter abdominal aortic aneurysm. Recommend follow-up ultrasound every 3 years. (Ref.: J Vasc Surg. 2018; 67:2-77 and J Am Coll Radiol 2013;10(10):789-794.) 3. Aortic atherosclerosis. 4. Lucent bone lesions, most prominently a destructive bone lesion in the right iliac bone. Possible metastatic disease. Electronically Signed   By: Burman Nieves M.D.   On: 12/26/2022 22:13     Assessment/Plan: Right iliac bone lesion with associated soft tissue mass.   With his history of prostate and lung cancer, I have ordered a PSA and recommended that the lesion be biopsied.  If this PSA is elevated, a PSMA PET scan could be considered before the biopsy and if prostate cancer proves to be the cause of the lesion, he would need ADT and consideration of local radiation therapy.  He would need to be off of Eliquis for  the biopsy.         No follow-ups on file.    CC: Marguerita Merles MD     Bjorn Pippin 12/28/2022

## 2022-12-28 NOTE — Care Management Obs Status (Signed)
MEDICARE OBSERVATION STATUS NOTIFICATION   Patient Details  Name: Jonathan [name] MRN: [id number] Date of Birth: 03-06-1941   Medicare Observation Status Notification Given:  Yes    Larrie Kass, LCSW 12/28/2022, 11:04 AM

## 2022-12-29 DIAGNOSIS — E1143 Type 2 diabetes mellitus with diabetic autonomic (poly)neuropathy: Secondary | ICD-10-CM | POA: Diagnosis not present

## 2022-12-29 DIAGNOSIS — R413 Other amnesia: Secondary | ICD-10-CM | POA: Diagnosis not present

## 2022-12-29 DIAGNOSIS — C3402 Malignant neoplasm of left main bronchus: Secondary | ICD-10-CM | POA: Diagnosis not present

## 2022-12-29 DIAGNOSIS — I480 Paroxysmal atrial fibrillation: Secondary | ICD-10-CM | POA: Diagnosis not present

## 2022-12-29 DIAGNOSIS — I10 Essential (primary) hypertension: Secondary | ICD-10-CM | POA: Diagnosis not present

## 2022-12-29 DIAGNOSIS — I714 Abdominal aortic aneurysm, without rupture, unspecified: Secondary | ICD-10-CM | POA: Diagnosis not present

## 2022-12-29 DIAGNOSIS — I4891 Unspecified atrial fibrillation: Secondary | ICD-10-CM | POA: Diagnosis not present

## 2022-12-29 DIAGNOSIS — N1831 Chronic kidney disease, stage 3a: Secondary | ICD-10-CM | POA: Diagnosis not present

## 2022-12-29 DIAGNOSIS — J439 Emphysema, unspecified: Secondary | ICD-10-CM | POA: Diagnosis not present

## 2022-12-29 DIAGNOSIS — C61 Malignant neoplasm of prostate: Secondary | ICD-10-CM | POA: Diagnosis not present

## 2022-12-29 LAB — CBC WITH DIFFERENTIAL/PLATELET
Abs Immature Granulocytes: 0.04 10*3/uL (ref 0.00–0.07)
Basophils Absolute: 0 10*3/uL (ref 0.0–0.1)
Basophils Relative: 0 %
Eosinophils Absolute: 0.3 10*3/uL (ref 0.0–0.5)
Eosinophils Relative: 4 %
HCT: 33.2 % — ABNORMAL LOW (ref 39.0–52.0)
Hemoglobin: 10.5 g/dL — ABNORMAL LOW (ref 13.0–17.0)
Immature Granulocytes: 1 %
Lymphocytes Relative: 19 %
Lymphs Abs: 1.6 10*3/uL (ref 0.7–4.0)
MCH: 28.8 pg (ref 26.0–34.0)
MCHC: 31.6 g/dL (ref 30.0–36.0)
MCV: 91 fL (ref 80.0–100.0)
Monocytes Absolute: 0.5 10*3/uL (ref 0.1–1.0)
Monocytes Relative: 6 %
Neutro Abs: 5.9 10*3/uL (ref 1.7–7.7)
Neutrophils Relative %: 70 %
Platelets: 249 10*3/uL (ref 150–400)
RBC: 3.65 MIL/uL — ABNORMAL LOW (ref 4.22–5.81)
RDW: 14.5 % (ref 11.5–15.5)
WBC: 8.3 10*3/uL (ref 4.0–10.5)
nRBC: 0 % (ref 0.0–0.2)

## 2022-12-29 LAB — COMPREHENSIVE METABOLIC PANEL
ALT: 9 U/L (ref 0–44)
AST: 13 U/L — ABNORMAL LOW (ref 15–41)
Albumin: 2.6 g/dL — ABNORMAL LOW (ref 3.5–5.0)
Alkaline Phosphatase: 59 U/L (ref 38–126)
Anion gap: 9 (ref 5–15)
BUN: 12 mg/dL (ref 8–23)
CO2: 25 mmol/L (ref 22–32)
Calcium: 7.9 mg/dL — ABNORMAL LOW (ref 8.9–10.3)
Chloride: 100 mmol/L (ref 98–111)
Creatinine, Ser: 1.21 mg/dL (ref 0.61–1.24)
GFR, Estimated: >60 mL/min (ref >60)
Glucose, Bld: 92 mg/dL (ref 70–99)
Potassium: 3.4 mmol/L — ABNORMAL LOW (ref 3.5–5.1)
Sodium: 134 mmol/L — ABNORMAL LOW (ref 135–145)
Total Bilirubin: 0.6 mg/dL (ref 0.3–1.2)
Total Protein: 6.5 g/dL (ref 6.5–8.1)

## 2022-12-29 LAB — GLUCOSE, CAPILLARY
Glucose-Capillary: 90 mg/dL (ref 70–99)
Glucose-Capillary: 88 mg/dL (ref 70–99)
Glucose-Capillary: 90 mg/dL (ref 70–99)
Glucose-Capillary: 101 mg/dL — ABNORMAL HIGH (ref 70–99)

## 2022-12-29 LAB — APTT
aPTT: 74 s — ABNORMAL HIGH (ref 24–36)
aPTT: 92 s — ABNORMAL HIGH (ref 24–36)

## 2022-12-29 LAB — MAGNESIUM: Magnesium: 2.2 mg/dL (ref 1.7–2.4)

## 2022-12-29 LAB — HEPARIN LEVEL (UNFRACTIONATED)
Heparin Unfractionated: 0.68 [IU]/mL (ref 0.30–0.70)
Heparin Unfractionated: 0.63 [IU]/mL (ref 0.30–0.70)

## 2022-12-29 LAB — PHOSPHORUS: Phosphorus: 1.9 mg/dL — ABNORMAL LOW (ref 2.5–4.6)

## 2022-12-29 MED ORDER — K PHOS MONO-SOD PHOS DI & MONO 155-852-130 MG PO TABS
500.0000 mg | ORAL_TABLET | Freq: Two times a day (BID) | ORAL | Status: AC
  Administered 2022-12-29 (×2): 500 mg via ORAL
  Filled 2022-12-29 (×2): qty 2

## 2022-12-29 MED ORDER — APIXABAN 5 MG PO TABS
5.0000 mg | ORAL_TABLET | Freq: Two times a day (BID) | ORAL | Status: DC
  Administered 2022-12-29 – 2022-12-30 (×2): 5 mg via ORAL
  Filled 2022-12-29 (×2): qty 1

## 2022-12-29 MED ORDER — POTASSIUM CHLORIDE CRYS ER 20 MEQ PO TBCR
40.0000 meq | EXTENDED_RELEASE_TABLET | Freq: Two times a day (BID) | ORAL | Status: DC
  Administered 2022-12-29 – 2022-12-30 (×3): 40 meq via ORAL
  Filled 2022-12-29 (×3): qty 2

## 2022-12-29 NOTE — Progress Notes (Signed)
PROGRESS NOTE    Jonathan [name]  QMV:[id number] DOB: [birth year] DOA: 12/26/2022 PCP: Lianne Moris, PA-C   Brief Narrative:  The patient is an 82 year old African-American male with a past medical history significant for but not limited to prostate cancer status post XRT and ADT and cancer of the right lung status post lobectomy and left upper lobe nodule status post SVR in 2017, history of CKD stage IIIa, hypertension, COPD, memory loss and a history of episode of rapid atrial fibrillation following his right upper lobe lobectomy who presented with abdominal discomfort and loss of appetite.  He presented with multiple vague complaints including abdominal discomfort and generalized weakness.  Family had noted that he has not been eating or drinking much recently and thinks that the abdominal pain and discomfort has been present for weeks to months and has been intermittent with associated nausea but no vomiting, diarrhea fevers or chills.  Patient has denied any chest pain or lightheadedness or dizziness.  On arrival to the ED is found to be afebrile and saturating well on room air.  He subsequently went into rapid atrial fibrillation with RVR with rates in the 140s in the ED.  Labs were notable for potassium 3.1 and a creatinine 1.27 normal troponins and a normal WBC.  CT scan of the abdomen pelvis was done and demonstrated 3.9 cm AAA and lucent bone lesions.  He was treated with potassium chloride Pepcid acetaminophen and was given Lopressor and started on Eliquis.  Subsequently urology was consulted and recommended getting a biopsy and then he was transitioned off of the Eliquis to heparin drip.  Interventional radiology evaluated and patient underwent a CT-guided right iliac bone biopsy.  He is doing well and his heart rate has converted to normal sinus rhythm.  Currently awaiting echocardiogram and anticipate discharging after this is done if it is has no issues as PT OT recommending home  health.   Assessment and Plan:  Atrial Fibrillation with RVR status post conversion to normal sinus rhythm -Hx of rapid a fib following RULobectomy in 2017, not anticoagulated pta  -Given IV Lopressor and started on Eliquis in ED however he is transition to heparin drip today -Continue Eliquis, supplement potassium, check magnesium level and TSH, use IV Lopressor as needed   -Hesitant to start Diltiazem gtt given low BP -Ordered Trop and was 9 and 10; ECHO and echocardiogram is still pending to be done -Cardiology consulted for further evaluation and recommendations and they are recommending metoprolol tartrate 25 mg p.o. twice daily and we have now transitioned back to Eliquis 5 mg p.o. twice daily and off of Heparin gtt   Lucent bone lesions; Hx of lung and prostate cancer  -Noted on CT in ED  -Discussed next steps with oncology Dr. Truett Perna this AM and recommending outpatient follow up with PET Scan however urology was consulted for the right iliac bone lesion and associated soft tissue mass and given his history of prostate and lung cancer Dr. Annabell Howells had ordered a PSA level and recommended that the lesion be biopsied -Dr. Annabell Howells feels that if the PSA is elevated a PSMA PET scan could be considered and if the bone biopsy reveals prostate cancer and that he would need ADT and consideration of local radiation therapy   COPD  -Not in Exacerbation   -Continue Spiriva, as-needed Albuterol    Abdominal Pain and Discomfort, improved -Improving -CT Scan done and showed "No evidence of bowel obstruction or inflammation. 3.9 cm diameter abdominal aortic aneurysm.  Aortic atherosclerosis. Lucent bone lesions, most prominently a destructive bone lesion in the right iliac bone. Possible metastatic disease." -Continue to Monitor    Essential Hypertension  -Cardiology consulted and started Metoprolol po instead of Atenolol -BP on the low side so will need to monitor carefully -Continue to Monitor BP  per Protocol -Last BP reading was soft at 107/65   CKD 3A  -SCr is 1.27 on admission, appears close to baseline  -BUN/Cr Trend: Recent Labs  Lab 12/26/22 1225 12/27/22 0640 12/28/22 0405 12/29/22 0404  BUN 14 14 13 12   CREATININE 1.27* 1.13 1.13 1.21  -Avoid Nephrotoxic Medications, Contrast Dyes, Hypotension and Dehydration to Ensure Adequate Renal Perfusion and will need to Renally Adjust Meds -Continue to Monitor and Trend Renal Function carefully and repeat CMP in the AM   Normocytic Anemia -Hgb/Hct Trend: Recent Labs  Lab 12/26/22 1225 12/27/22 0640 12/28/22 0405 12/29/22 0404  HGB 11.7* 11.3* 10.6* 10.5*  HCT 37.6* 35.8* 34.4* 33.2*  MCV 91.3 91.1 91.7 91.0  -Check Anemia Panel in the AM -Continue to Monitor for S/Sx of Bleeding; No overt bleeding noted -Repeat CBC in the AM  Hypokalemia -Patient's K+ Level Trend: Recent Labs  Lab 12/26/22 1225 12/27/22 0640 12/28/22 0405 12/29/22 0404  K 3.1* 3.8 3.3* 3.4*  -Replete with Potassium Chloride 40 mEQ BID again and po K Phos Neutral 500 mg x2 -Continue to Monitor and Replete as Necessary -Repeat CMP in the AM   Type II DM  -A1c was 5.8% in January 2023 and was the same this visit -Check CBGs and patient is on a Very Sensitive Novolog SSI AC/HS   Memory Loss  -Use delirium precautions    AAA -Noted incidentally on CT in ED  -CT Scan showed "No adrenal gland nodules. Multiple renal cysts. Largest on the left measures 3.6 cm diameter. No imaging follow-up is indicated. No hydronephrosis or hydroureter. Bladder is Normal" -Discussed with patient, outpatient follow-up recommended   Hypoalbuminemia -Patient's Albumin Trend: Recent Labs  Lab 12/26/22 1225 12/28/22 0405 12/29/22 0404  ALBUMIN 3.1* 2.7* 2.6*  -Continue to Monitor and Trend and repeat CMP in the AM   DVT prophylaxis:  apixaban (ELIQUIS) tablet 5 mg    Code Status: Full Code Family Communication: Discussed with patient's daughter at  bedside  Disposition Plan:  Level of care: Progressive Status is: Observation The patient remains OBS appropriate and will d/c before 2 midnights. Currently awaiting echocardiogram to be discharged  Consultants:  Cardiology Interventional radiology Discussed with medical oncology Urology  Procedures:  Technically successful CT guided biopsy of infiltrative right iliac lesion   Antimicrobials:  Anti-infectives (From admission, onward)    None       Subjective: Seen and examined at bedside and he is doing fairly well.  Thinks he is doing little bit better.  No nausea or vomiting.  No other concerns or complaints at this time.  Objective: Vitals:   12/28/22 2042 12/29/22 0559 12/29/22 0923 12/29/22 1422  BP: 105/64 103/66  107/65  Pulse: 77 75  71  Resp: 17 19  18   Temp: 99.3 F (37.4 C) 99.3 F (37.4 C)  99.6 F (37.6 C)  TempSrc: Oral Oral  Oral  SpO2: 99% 100% 98% 98%  Weight:      Height:        Intake/Output Summary (Last 24 hours) at 12/29/2022 1744 Last data filed at 12/29/2022 1237 Gross per 24 hour  Intake 500.97 ml  Output 575 ml  Net -74.03  ml   Filed Weights   12/26/22 1220 12/27/22 1626 12/28/22 0458  Weight: 101.2 kg 93.9 kg 97 kg   Examination: Physical Exam:  Constitutional: WN/WD African-American male in no acute distress Respiratory: Diminished to auscultation bilaterally, no wheezing, rales, rhonchi or crackles. Normal respiratory effort and patient is not tachypenic. No accessory muscle use.  Unlabored breathing Cardiovascular: RRR, no murmurs / rubs / gallops. S1 and S2 auscultated. No extremity edema.  Abdomen: Soft, non-tender, non-distended. Bowel sounds positive.  GU: Deferred. Musculoskeletal: No clubbing / cyanosis of digits/nails. No joint deformity upper and lower extremities.  Skin: No rashes, lesions, ulcers on limited skin evaluation. No induration; Warm and dry.  Neurologic: CN 2-12 grossly intact with no focal deficits.  Romberg sign and cerebellar reflexes not assessed.  Psychiatric: Normal judgment and insight. Alert and oriented x 3. Normal mood and appropriate affect.   Data Reviewed: I have personally reviewed following labs and imaging studies  CBC: Recent Labs  Lab 12/26/22 1225 12/27/22 0640 12/28/22 0405 12/29/22 0404  WBC 10.5 9.1 8.2 8.3  NEUTROABS  --   --  5.9 5.9  HGB 11.7* 11.3* 10.6* 10.5*  HCT 37.6* 35.8* 34.4* 33.2*  MCV 91.3 91.1 91.7 91.0  PLT 253 238 249 249   Basic Metabolic Panel: Recent Labs  Lab 12/26/22 1225 12/27/22 0640 12/28/22 0405 12/29/22 0404  NA 137 136 133* 134*  K 3.1* 3.8 3.3* 3.4*  CL 101 98 98 100  CO2 29 27 25 25   GLUCOSE 111* 103* 97 92  BUN 14 14 13 12   CREATININE 1.27* 1.13 1.13 1.21  CALCIUM 8.7* 8.4* 8.0* 7.9*  MG  --  2.1 2.1 2.2  PHOS  --   --  2.6 1.9*   GFR: Estimated Creatinine Clearance: 66.1 mL/min (by C-G formula based on SCr of 1.21 mg/dL). Liver Function Tests: Recent Labs  Lab 12/26/22 1225 12/28/22 0405 12/29/22 0404  AST 16 12* 13*  ALT 14 12 9   ALKPHOS 77 60 59  BILITOT 0.7 0.9 0.6  PROT 8.0 6.9 6.5  ALBUMIN 3.1* 2.7* 2.6*   Recent Labs  Lab 12/26/22 1225  LIPASE 34   No results for input(s): "AMMONIA" in the last 168 hours. Coagulation Profile: Recent Labs  Lab 12/28/22 1311  INR 1.2   Cardiac Enzymes: No results for input(s): "CKTOTAL", "CKMB", "CKMBINDEX", "TROPONINI" in the last 168 hours. BNP (last 3 results) No results for input(s): "PROBNP" in the last 8760 hours. HbA1C: Recent Labs    12/27/22 0640  HGBA1C 5.8*   CBG: Recent Labs  Lab 12/28/22 1647 12/28/22 2038 12/29/22 0754 12/29/22 1235 12/29/22 1652  GLUCAP 89 136* 90 88 90   Lipid Profile: No results for input(s): "CHOL", "HDL", "LDLCALC", "TRIG", "CHOLHDL", "LDLDIRECT" in the last 72 hours. Thyroid Function Tests: Recent Labs    12/27/22 0640  TSH 0.852   Anemia Panel: No results for input(s): "VITAMINB12", "FOLATE",  "FERRITIN", "TIBC", "IRON", "RETICCTPCT" in the last 72 hours. Sepsis Labs: No results for input(s): "PROCALCITON", "LATICACIDVEN" in the last 168 hours.  No results found for this or any previous visit (from the past 240 hour(s)).   Radiology Studies: CT BONE TROCAR/NEEDLE BIOPSY DEEP  Result Date: 12/28/2022 INDICATION: History of lung and prostate cancer, now with lytic expansile lesion involving the right ilium. Please perform CT-guided biopsy for tissue diagnostic purposes. EXAM: CT-GUIDED BONE LESION BIOPSY MEDICATIONS: None COMPARISON:  CT abdomen and pelvis-12/26/2022 ANESTHESIA/SEDATION: Moderate (conscious) sedation was employed during this procedure as  administered by the Interventional Radiology RN. A total of Fentanyl 50 mcg was administered intravenously. Moderate Sedation Time: 15 minutes. The patient's level of consciousness and vital signs were monitored continuously by radiology nursing throughout the procedure under my direct supervision. COMPLICATIONS: None immediate. PROCEDURE: Informed consent was obtained from the patient following an explanation of the procedure, risks, benefits and alternatives. The patient understands, agrees and consents for the procedure. All questions were addressed. A time out was performed prior to the initiation of the procedure. The patient was positioned prone on the CT table and a limited CT was performed for procedural planning demonstrating unchanged size and appearance of the at least 3.2 x 3.2 cm lytic expansile lesion involving the right ilium (image 20, series 2). The procedure was planned. The operative site was prepped and draped in the usual sterile fashion. Appropriate trajectory was confirmed with a 22 gauge spinal needle (series 4) after the adjacent tissues were anesthetized with 1% Lidocaine with epinephrine. Next, an 11 gauge coaxial bone biopsy needle was advanced into the peripheral aspect of the slightly expansile right iliac lesion.  Appropriate presumably confirmed with CT imaging. Next, the inner 13 gauge bone biopsy device was utilized to obtain a core needle biopsy followed by the acquisition of additional core needle biopsy with the outer 11 gauge biopsy device. The identical procedure was repeated with the acquisition of 2 additional core needle biopsies with both the 13 gauge bone biopsy device as well as the outer 11 gauge bone biopsy device (representative 11, series 9). The needle was removed and superficial hemostasis was obtained with manual compression. A dressing was applied. The patient tolerated the procedure well without immediate post procedural complication. IMPRESSION: Technically successful CT-guided core needle biopsy of expansile lytic right iliac osseous lesion. Electronically Signed   By: Simonne Come M.D.   On: 12/28/2022 13:19    Scheduled Meds:  apixaban  5 mg Oral BID   atorvastatin  20 mg Oral Daily   feeding supplement  237 mL Oral BID BM   insulin aspart  0-5 Units Subcutaneous QHS   insulin aspart  0-6 Units Subcutaneous TID WC   latanoprost  1 drop Both Eyes QHS   metoprolol tartrate  25 mg Oral BID   pantoprazole  40 mg Oral Daily   phosphorus  500 mg Oral BID   potassium chloride  40 mEq Oral BID   sodium chloride flush  3 mL Intravenous Q12H   sucralfate  1 g Oral BID   timolol  1 drop Both Eyes BID   umeclidinium bromide  1 puff Inhalation Daily   Continuous Infusions:  heparin 1,450 Units/hr (12/29/22 1353)    LOS: 0 days   Marguerita Merles, DO [facility] Available via Epic secure chat 7am-7pm After these hours, please refer to coverage provider listed on [email or web address] 12/29/2022, 5:44 PM

## 2022-12-29 NOTE — Progress Notes (Signed)
PHARMACY - ANTICOAGULATION CONSULT NOTE  Pharmacy Consult for IV heparin Indication: atrial fibrillation  Allergies  Allergen Reactions   Ampicillin Other (See Comments)    Don't work   Levaquin [Levofloxacin] Other (See Comments)    Doesn't work    Patient Measurements: Height: 6\' 8"  (203.2 cm) Weight: 97 kg (213 lb 13.5 oz) IBW/kg (Calculated) : 96 Heparin Dosing Weight: 97 kg   Vital Signs: Temp: 99.3 F (37.4 C) (10/05 0559) Temp Source: Oral (10/05 0559) BP: 103/66 (10/05 0559) Pulse Rate: 75 (10/05 0559)  Labs: Recent Labs    12/26/22 2201 12/27/22 0640 12/27/22 1030 12/27/22 1641 12/28/22 0405 12/28/22 1311 12/29/22 0404 12/29/22 1054  HGB  --  11.3*  --   --  10.6*  --  10.5*  --   HCT  --  35.8*  --   --  34.4*  --  33.2*  --   PLT  --  238  --   --  249  --  249  --   APTT  --   --   --   --   --   --  74*  --   LABPROT  --   --   --   --   --  15.6*  --   --   INR  --   --   --   --   --  1.2  --   --   HEPARINUNFRC  --   --   --   --   --   --  0.68 0.63  CREATININE  --  1.13  --   --  1.13  --  1.21  --   TROPONINIHS 6  --  9 10  --   --   --   --     Estimated Creatinine Clearance: 66.1 mL/min (by C-G formula based on SCr of 1.21 mg/dL).   Medical History: Past Medical History:  Diagnosis Date   Acquired bladder diverticulum    12/ 2012  resection diverticulum done at [facility] in [place], [name]   Age-related cataract of right eye    scheduled for catarat extraction 09-12-2017   Agent orange exposure    Aneurysm of infrarenal abdominal aorta (HCC)    first dx 2017--- last CT 06-11-2017  measures 3.5cm   Anxiety    Chronic constipation    COPD with emphysema (HCC)    06--12-2017  per pt no symptoms since Feb 2019   Depression    PTSD   Diabetes mellitus type 2, diet-controlled (HCC)    Diabetic neuropathy (HCC)    Dyslipidemia    Erectile dysfunction    Feeling of incomplete bladder emptying    Gait abnormality 01/06/2020   GERD  (gastroesophageal reflux disease)    Gout    09-02-2017 last flare up 3 months ago   Hiatal hernia    History of atrial fibrillation    episode post op right lung lobectomy 07-04-2015   History of bladder stone    History of colon polyps    History of DVT of lower extremity    03/ 2019  bilateral lower extremity (superficial) ---  per treated w/ oral medication    History of gastritis    History of kidney stones    History of radiation therapy 09-14-2015  to 09-21-2015   left upper lung nodule -- 54Gy in 3 fractions (18 Gy per fraction)   Hyperplasia of prostate with lower urinary tract symptoms (LUTS)  Hypertension    Lumbar spinal stenosis    Nephrolithiasis    Neurogenic bladder    Osteoarthritis    ankle, hands   Osteoporosis    Prostate cancer [facility]) urologist-  dr wrenn/  oncologist-  dr Kathrynn Running   dx 05-24-2017-- Stage T2b,  Gleason 4+4,  PSA 22.41,  vol 38cc--- started ADT 04/ 2019,  plan external radiation therapy   Radiation fibrosis of lung (HCC)    hx left upper long nodule SBRT 06/ 2017   Squamous cell carcinoma of both lungs (HCC) last CT in epic dated 06-26-2017 no recurrence   dx 03/ 2017  non-small cell SCC via bronchoscopy w/ bx's by dr Dorris Fetch---  s/p  right VATS w/ right lobectomy and node dissection's 07-04-2015 /  pt had SBRT to left upper nodule Stage 1 ([facility]) completed 09-21-2015   Vertigo 2018   Wears dentures    bottom   Wears glasses     Medications:  Medications Prior to Admission  Medication Sig Dispense Refill Last Dose   albuterol (PROVENTIL) (2.5 MG/3ML) 0.083% nebulizer solution Take 3 mLs (2.5 mg total) by nebulization every 6 (six) hours as needed for shortness of breath. 75 mL 1 Unknown   atenolol (TENORMIN) 25 MG tablet Take 1 tablet by mouth daily.   12/26/2022   atorvastatin (LIPITOR) 20 MG tablet Take 20 mg by mouth daily.   12/26/2022   diclofenac Sodium (VOLTAREN) 1 % GEL Apply 2 g topically 4 (four) times daily as  needed (pain).   Unknown   Docusate Sodium (DSS) 100 MG CAPS Take 100 mg by mouth daily.   Unknown   doxycycline (MONODOX) 100 MG capsule Take 100 mg by mouth 2 (two) times daily.   12/26/2022   escitalopram (LEXAPRO) 5 MG tablet Take 5 mg by mouth daily.   12/26/2022   famotidine (PEPCID) 20 MG tablet Take by mouth.   12/26/2022   gabapentin (NEURONTIN) 800 MG tablet Take 800 mg by mouth daily.   12/26/2022   latanoprost (XALATAN) 0.005 % ophthalmic solution Place 1 drop into both eyes at bedtime.   12/26/2022   LINZESS 72 MCG capsule Take 72 mcg by mouth daily before breakfast.   12/19/2022   meclizine (ANTIVERT) 12.5 MG tablet Take 6.25-12.5 mg by mouth every 6 (six) hours as needed for dizziness.   Unknown   meloxicam (MOBIC) 7.5 MG tablet Take 1 tablet (7.5 mg total) by mouth daily. 14 tablet 0 Unknown   methocarbamol (ROBAXIN) 500 MG tablet Take 1 tablet (500 mg total) by mouth 2 (two) times daily as needed for muscle spasms. 20 tablet 0 Unknown   metoCLOPramide (REGLAN) 5 MG tablet Take 5 mg by mouth daily as needed for vomiting or nausea.   Unknown   mupirocin cream (BACTROBAN) 2 % Apply 1 application topically 2 (two) times daily as needed (wound care).   Unknown   Oxycodone HCl 20 MG TABS Take 5 mg by mouth See admin instructions. Takes 1/4 of a pill 4 times a day  0 Unknown   pantoprazole (PROTONIX) 40 MG tablet Take 40 mg by mouth daily.   12/26/2022   predniSONE (DELTASONE) 20 MG tablet Take 20 mg by mouth daily as needed (unknown).   Unknown   ROCKLATAN 0.02-0.005 % SOLN Place 1 drop into both eyes at bedtime.   12/26/2022   scopolamine (TRANSDERM-SCOP) 1 MG/3DAYS 1 patch every 3 (three) days.   Unknown   Simethicone 180 MG CAPS Take by mouth  as needed.   Unknown   SPIRIVA HANDIHALER 18 MCG inhalation capsule Place 18 mcg into inhaler and inhale as needed.   Unknown   sucralfate (CARAFATE) 1 GM/10ML suspension Take 1 g by mouth 2 (two) times daily.   12/26/2022   tamsulosin (FLOMAX) 0.4 MG  CAPS capsule Take 1 capsule (0.4 mg total) by mouth daily. 90 capsule 3 12/26/2022   timolol (TIMOPTIC) 0.5 % ophthalmic solution Place 1 drop into both eyes 2 (two) times daily as needed (eye pressure).   12/26/2022   atenolol-chlorthalidone (TENORETIC) 50-25 MG tablet Take 1 tablet by mouth at bedtime.  (Patient not taking: Reported on 12/27/2022)   Not Taking   capsaicin (ZOSTRIX) 0.025 % cream Apply 1 application topically daily as needed (Skin Rash). (Patient not taking: Reported on 12/27/2022)   Not Taking   donepezil (ARICEPT) 10 MG tablet Take 10 mg by mouth daily. (Patient not taking: Reported on 12/27/2022)   Not Taking   doxycycline (VIBRAMYCIN) 100 MG capsule Take 1 capsule (100 mg total) by mouth every 12 (twelve) hours. (Patient not taking: Reported on 12/27/2022) 14 capsule 5 Not Taking   FREESTYLE LITE test strip       hydroquinone 4 % cream Apply 1 application topically daily as needed (Skin Rash). (Patient not taking: Reported on 12/27/2022)   Not Taking   ibuprofen (ADVIL) 600 MG tablet Take 600 mg by mouth every 6 (six) hours as needed for headache. (Patient not taking: Reported on 12/27/2022)   Not Taking   Incontinence Supply Disposable ([employer] MENS GUARD) MISC 1 Pad by Does not apply route 2 (two) times daily. 60 each 11    Incontinence Supply Disposable (DEPEND REAL FIT/BRIEF/MEN/L-XL) MISC 1 Pad by Does not apply route in the morning and at bedtime. 60 each 11    Lancets (FREESTYLE) lancets       LANTUS SOLOSTAR 100 UNIT/ML Solostar Pen Inject 2.5 Units into the skin at bedtime. (Patient not taking: Reported on 12/27/2022)   Not Taking   lidocaine (LIDODERM) 5 % Place 1 patch onto the skin daily. Remove & Discard patch within 12 hours or as directed by MD (Patient not taking: Reported on 12/27/2022) 30 patch 0 Not Taking   mirabegron ER (MYRBETRIQ) 25 MG TB24 tablet Take 25 mg by mouth. (Patient not taking: Reported on 12/27/2022)   Not Taking   mupirocin ointment (BACTROBAN) 2 % Apply 1  application topically 2 (two) times daily as needed (Rash). (Patient not taking: Reported on 12/27/2022)   Not Taking   nystatin ointment (MYCOSTATIN) 1 application daily as needed (Skin Rash). (Patient not taking: Reported on 12/27/2022)   Not Taking   nystatin-triamcinolone ointment (MYCOLOG) Apply 1 application topically 2 (two) times daily. To groins (Patient not taking: Reported on 12/27/2022) 30 g 11 Not Taking   pantoprazole (PROTONIX) 20 MG tablet Take 20 mg by mouth daily as needed for heartburn or indigestion. (Patient not taking: Reported on 12/27/2022)   Not Taking   psyllium (METAMUCIL SMOOTH TEXTURE) 58.6 % powder Take 1 packet by mouth daily. (Patient not taking: Reported on 12/27/2022)   Not Taking   RHOPRESSA 0.02 % SOLN Place 1 drop into the right eye at bedtime. (Patient not taking: Reported on 12/27/2022)   Not Taking   terbinafine (LAMISIL) 250 MG tablet Take 250 mg by mouth 2 (two) times daily. (Patient not taking: Reported on 12/27/2022)   Not Taking   VIAGRA 100 MG tablet Take 1 tablet (100 mg total) by  mouth daily as needed for erectile dysfunction. (Patient not taking: Reported on 12/27/2022) 30 tablet 6 Not Taking    Assessment: Pharmacy is consulted to start heparin on 82 yo male diagnosed with atrial fibrillation. Pt had been started on Eliquis on 10/3 for new onset atrial fibrillation with last dose being on 10/3 at 2139. Eliquis is now to be held as pt will need an IR iliac bone biopsy - completed on 10/4 at 12:16.     10/4: Per IR consult, post-procedure bleeding risk is standard. Will resume heparin drip 6 hours after end of procedure   12/29/2022 aPTT 92, therapeutic on heparin 1450 units/hr HL 0.63 also therapeutic CBC:  Hgb 10.5, plts 249 No bleeding or interruptions per RN  Goal of Therapy:  aPTT 66-102 sec HL 0.3-0.7 Monitor platelets by anticoagulation protocol: Yes   Plan:  Continue heparin drip at 1450 units/hr  Daily Heparin leve, CBC while on heparin  drip Monitor for signs and symptoms of bleeding Follow up plans to resume DOAC when no further testing/procedures planned.     Lynann Beaver PharmD, BCPS [facility] main pharmacy [phone] 12/29/2022 11:27 AM

## 2022-12-29 NOTE — TOC Transition Note (Signed)
Transition of Care [facility]) - CM/SW Discharge Note   Patient Details  Name: Jonathan [name] MRN: [id number] Date of Birth: 07/29/1940  Transition [facility]) CM/SW Contact:  Amada Jupiter, LCSW Phone Number: 12/29/2022, 2:30 PM   Clinical Narrative:     Met with pt and daughter to review PT recommendation for HHPT/ OT follow up.  Pt is agreeable and no agency preference.  Referral placed with [facility] with agency info added to AVS.  No further TOC needs.  Final next level of care: Home w Home Health Services Barriers to Discharge: No Barriers Identified   Patient Goals and CMS Choice      Discharge Placement                         Discharge Plan and Services Additional resources added to the After Visit Summary for                  DME Arranged: N/A DME Agency: NA       HH Arranged: PT, OT HH Agency: [facility] Date [facility] Agency Contacted: 12/29/22 Time HH Agency Contacted: 1430 Representative spoke with at [facility] Agency: Kandee Keen  Social Determinants of Health (SDOH) Interventions SDOH Screenings   Food Insecurity: No Food Insecurity (12/27/2022)  Housing: Low Risk  (12/27/2022)  Transportation Needs: No Transportation Needs (12/27/2022)  Utilities: Not At Risk (12/27/2022)  Financial Resource Strain: Low Risk  (11/05/2022)   Received from [facility]  Physical Activity: Insufficiently Active (11/04/2022)   Received from [facility]  Social Connections: Socially Integrated (11/04/2022)   Received from [facility]  Stress: No Stress Concern Present (11/04/2022)   Received from [facility]  Tobacco Use: Medium Risk (12/27/2022)  Health Literacy: Low Risk  (11/04/2022)   Received from [facility]     Readmission Risk Interventions     No data to display

## 2022-12-29 NOTE — Progress Notes (Signed)
Progress Note  Patient Name: Jonathan [name] Date of Encounter: 12/29/2022  Primary Cardiologist:   Dina Rich, MD   Subjective   No chest pain.  He is anxious to go home.   Status post iliac crest biopsy  Inpatient Medications    Scheduled Meds:  atorvastatin  20 mg Oral Daily   feeding supplement  237 mL Oral BID BM   insulin aspart  0-5 Units Subcutaneous QHS   insulin aspart  0-6 Units Subcutaneous TID WC   latanoprost  1 drop Both Eyes QHS   metoprolol tartrate  25 mg Oral BID   pantoprazole  40 mg Oral Daily   sodium chloride flush  3 mL Intravenous Q12H   sucralfate  1 g Oral BID   timolol  1 drop Both Eyes BID   umeclidinium bromide  1 puff Inhalation Daily   Continuous Infusions:  heparin 1,450 Units/hr (12/29/22 0547)   PRN Meds: acetaminophen **OR** acetaminophen, albuterol, metoprolol tartrate, ondansetron **OR** ondansetron (ZOFRAN) IV, oxyCODONE, polyethylene glycol   Vital Signs    Vitals:   12/28/22 1210 12/28/22 1320 12/28/22 2042 12/29/22 0559  BP: 101/68 96/64 105/64 103/66  Pulse:  64 77 75  Resp: 19 16 17 19   Temp:  98.2 F (36.8 C) 99.3 F (37.4 C) 99.3 F (37.4 C)  TempSrc:  Oral Oral Oral  SpO2: 100% 99% 99% 100%  Weight:      Height:        Intake/Output Summary (Last 24 hours) at 12/29/2022 0743 Last data filed at 12/29/2022 0600 Gross per 24 hour  Intake 1100.97 ml  Output 400 ml  Net 700.97 ml   Filed Weights   12/26/22 1220 12/27/22 1626 12/28/22 0458  Weight: 101.2 kg 93.9 kg 97 kg    Telemetry    NSR - Personally Reviewed  ECG    NA - Personally Reviewed  Physical Exam   GEN: No acute distress.   Neck: No  JVD Cardiac: RRR,  no murmurs, rubs, or gallops.  Respiratory: Clear  to auscultation bilaterally. GI: Soft, nontender, non-distended  MS: No  edema; No deformity. Neuro:  Nonfocal  Psych: Normal affect   Labs    Chemistry Recent Labs  Lab 12/26/22 1225 12/27/22 0640 12/28/22 0405  12/29/22 0404  NA 137 136 133* 134*  K 3.1* 3.8 3.3* 3.4*  CL 101 98 98 100  CO2 29 27 25 25   GLUCOSE 111* 103* 97 92  BUN 14 14 13 12   CREATININE 1.27* 1.13 1.13 1.21  CALCIUM 8.7* 8.4* 8.0* 7.9*  PROT 8.0  --  6.9 6.5  ALBUMIN 3.1*  --  2.7* 2.6*  AST 16  --  12* 13*  ALT 14  --  12 9  ALKPHOS 77  --  60 59  BILITOT 0.7  --  0.9 0.6  GFRNONAA 57* >60 >60 >60  ANIONGAP 7 11 10 9      Hematology Recent Labs  Lab 12/27/22 0640 12/28/22 0405 12/29/22 0404  WBC 9.1 8.2 8.3  RBC 3.93* 3.75* 3.65*  HGB 11.3* 10.6* 10.5*  HCT 35.8* 34.4* 33.2*  MCV 91.1 91.7 91.0  MCH 28.8 28.3 28.8  MCHC 31.6 30.8 31.6  RDW 14.4 14.6 14.5  PLT 238 249 249    Cardiac EnzymesNo results for input(s): "TROPONINI" in the last 168 hours. No results for input(s): "TROPIPOC" in the last 168 hours.   BNPNo results for input(s): "BNP", "PROBNP" in the last 168 hours.  DDimer No results for input(s): "DDIMER" in the last 168 hours.   Radiology    CT BONE TROCAR/NEEDLE BIOPSY DEEP  Result Date: 12/28/2022 INDICATION: History of lung and prostate cancer, now with lytic expansile lesion involving the right ilium. Please perform CT-guided biopsy for tissue diagnostic purposes. EXAM: CT-GUIDED BONE LESION BIOPSY MEDICATIONS: None COMPARISON:  CT abdomen and pelvis-12/26/2022 ANESTHESIA/SEDATION: Moderate (conscious) sedation was employed during this procedure as administered by the Interventional Radiology RN. A total of Fentanyl 50 mcg was administered intravenously. Moderate Sedation Time: 15 minutes. The patient's level of consciousness and vital signs were monitored continuously by radiology nursing throughout the procedure under my direct supervision. COMPLICATIONS: None immediate. PROCEDURE: Informed consent was obtained from the patient following an explanation of the procedure, risks, benefits and alternatives. The patient understands, agrees and consents for the procedure. All questions were  addressed. A time out was performed prior to the initiation of the procedure. The patient was positioned prone on the CT table and a limited CT was performed for procedural planning demonstrating unchanged size and appearance of the at least 3.2 x 3.2 cm lytic expansile lesion involving the right ilium (image 20, series 2). The procedure was planned. The operative site was prepped and draped in the usual sterile fashion. Appropriate trajectory was confirmed with a 22 gauge spinal needle (series 4) after the adjacent tissues were anesthetized with 1% Lidocaine with epinephrine. Next, an 11 gauge coaxial bone biopsy needle was advanced into the peripheral aspect of the slightly expansile right iliac lesion. Appropriate presumably confirmed with CT imaging. Next, the inner 13 gauge bone biopsy device was utilized to obtain a core needle biopsy followed by the acquisition of additional core needle biopsy with the outer 11 gauge biopsy device. The identical procedure was repeated with the acquisition of 2 additional core needle biopsies with both the 13 gauge bone biopsy device as well as the outer 11 gauge bone biopsy device (representative 11, series 9). The needle was removed and superficial hemostasis was obtained with manual compression. A dressing was applied. The patient tolerated the procedure well without immediate post procedural complication. IMPRESSION: Technically successful CT-guided core needle biopsy of expansile lytic right iliac osseous lesion. Electronically Signed   By: Simonne Come M.D.   On: 12/28/2022 13:19    Cardiac Studies   Echo :  Pending  Patient Profile     82 y.o. male with a history of coronary artery calcifications noted on prior CT scan  with low risk Myoview in 08/2022, hypertension, hyperlipidemia, type 2 diabetes mellitus with peripheral neuropathy, AAA, COPD, lung cancer s/p right lung lobectomy, radiation pneumonitis, agent orange exposure, GERD, and gout who is being seen  12/27/2022 for the evaluation of new onset atrial fibrillation at the request of Dr. Marland Mcalpine.    Assessment & Plan    Atrial fib:  Now in NSR.  Currently on IV Heparin and will need DOAC when no further interventions/invasive testing is indicated.   AAA:  3.9 cm.  Will follow accordingly.    HTN:    BP is low but tolerating beta blocker.   No change in therapy.   Hypokalemia:  Will supplement today again.   For questions or updates, please contact [facility] Please consult [email or web address] for contact info under Cardiology/STEMI.   Signed, Rollene Rotunda, MD  12/29/2022, 7:43 AM

## 2022-12-29 NOTE — Progress Notes (Signed)
PHARMACY - ANTICOAGULATION CONSULT NOTE  Pharmacy Consult for IV heparin Indication: atrial fibrillation  Allergies  Allergen Reactions   Ampicillin Other (See Comments)    Don't work   Levaquin [Levofloxacin] Other (See Comments)    Doesn't work    Patient Measurements: Height: 6\' 8"  (203.2 cm) Weight: 97 kg (213 lb 13.5 oz) IBW/kg (Calculated) : 96 Heparin Dosing Weight: 97 kg   Vital Signs: Temp: 99.3 F (37.4 C) (10/04 2042) Temp Source: Oral (10/04 2042) BP: 105/64 (10/04 2042) Pulse Rate: 77 (10/04 2042)  Labs: Recent Labs    12/26/22 1225 12/26/22 2201 12/27/22 0640 12/27/22 1030 12/27/22 1641 12/28/22 0405 12/28/22 1311 12/29/22 0404  HGB 11.7*  --  11.3*  --   --  10.6*  --  10.5*  HCT 37.6*  --  35.8*  --   --  34.4*  --  33.2*  PLT 253  --  238  --   --  249  --  249  APTT  --   --   --   --   --   --   --  74*  LABPROT  --   --   --   --   --   --  15.6*  --   INR  --   --   --   --   --   --  1.2  --   HEPARINUNFRC  --   --   --   --   --   --   --  0.68  CREATININE 1.27*  --  1.13  --   --  1.13  --   --   TROPONINIHS  --  6  --  9 10  --   --   --     Estimated Creatinine Clearance: 70.8 mL/min (by C-G formula based on SCr of 1.13 mg/dL).   Medical History: Past Medical History:  Diagnosis Date   Acquired bladder diverticulum    12/ 2012  resection diverticulum done at [facility] in [place], [name]   Age-related cataract of right eye    scheduled for catarat extraction 09-12-2017   Agent orange exposure    Aneurysm of infrarenal abdominal aorta (HCC)    first dx 2017--- last CT 06-11-2017  measures 3.5cm   Anxiety    Chronic constipation    COPD with emphysema (HCC)    06--12-2017  per pt no symptoms since Feb 2019   Depression    PTSD   Diabetes mellitus type 2, diet-controlled (HCC)    Diabetic neuropathy (HCC)    Dyslipidemia    Erectile dysfunction    Feeling of incomplete bladder emptying    Gait abnormality 01/06/2020   GERD  (gastroesophageal reflux disease)    Gout    09-02-2017 last flare up 3 months ago   Hiatal hernia    History of atrial fibrillation    episode post op right lung lobectomy 07-04-2015   History of bladder stone    History of colon polyps    History of DVT of lower extremity    03/ 2019  bilateral lower extremity (superficial) ---  per treated w/ oral medication    History of gastritis    History of kidney stones    History of radiation therapy 09-14-2015  to 09-21-2015   left upper lung nodule -- 54Gy in 3 fractions (18 Gy per fraction)   Hyperplasia of prostate with lower urinary tract symptoms (LUTS)    Hypertension  Lumbar spinal stenosis    Nephrolithiasis    Neurogenic bladder    Osteoarthritis    ankle, hands   Osteoporosis    Prostate cancer [facility]) urologist-  dr wrenn/  oncologist-  dr Kathrynn Running   dx 05-24-2017-- Stage T2b,  Gleason 4+4,  PSA 22.41,  vol 38cc--- started ADT 04/ 2019,  plan external radiation therapy   Radiation fibrosis of lung (HCC)    hx left upper long nodule SBRT 06/ 2017   Squamous cell carcinoma of both lungs (HCC) last CT in epic dated 06-26-2017 no recurrence   dx 03/ 2017  non-small cell SCC via bronchoscopy w/ bx's by dr Dorris Fetch---  s/p  right VATS w/ right lobectomy and node dissection's 07-04-2015 /  pt had SBRT to left upper nodule Stage 1 ([facility]) completed 09-21-2015   Vertigo 2018   Wears dentures    bottom   Wears glasses     Medications:  Medications Prior to Admission  Medication Sig Dispense Refill Last Dose   albuterol (PROVENTIL) (2.5 MG/3ML) 0.083% nebulizer solution Take 3 mLs (2.5 mg total) by nebulization every 6 (six) hours as needed for shortness of breath. 75 mL 1 Unknown   atenolol (TENORMIN) 25 MG tablet Take 1 tablet by mouth daily.   12/26/2022   atorvastatin (LIPITOR) 20 MG tablet Take 20 mg by mouth daily.   12/26/2022   diclofenac Sodium (VOLTAREN) 1 % GEL Apply 2 g topically 4 (four) times daily as  needed (pain).   Unknown   Docusate Sodium (DSS) 100 MG CAPS Take 100 mg by mouth daily.   Unknown   doxycycline (MONODOX) 100 MG capsule Take 100 mg by mouth 2 (two) times daily.   12/26/2022   escitalopram (LEXAPRO) 5 MG tablet Take 5 mg by mouth daily.   12/26/2022   famotidine (PEPCID) 20 MG tablet Take by mouth.   12/26/2022   gabapentin (NEURONTIN) 800 MG tablet Take 800 mg by mouth daily.   12/26/2022   latanoprost (XALATAN) 0.005 % ophthalmic solution Place 1 drop into both eyes at bedtime.   12/26/2022   LINZESS 72 MCG capsule Take 72 mcg by mouth daily before breakfast.   12/19/2022   meclizine (ANTIVERT) 12.5 MG tablet Take 6.25-12.5 mg by mouth every 6 (six) hours as needed for dizziness.   Unknown   meloxicam (MOBIC) 7.5 MG tablet Take 1 tablet (7.5 mg total) by mouth daily. 14 tablet 0 Unknown   methocarbamol (ROBAXIN) 500 MG tablet Take 1 tablet (500 mg total) by mouth 2 (two) times daily as needed for muscle spasms. 20 tablet 0 Unknown   metoCLOPramide (REGLAN) 5 MG tablet Take 5 mg by mouth daily as needed for vomiting or nausea.   Unknown   mupirocin cream (BACTROBAN) 2 % Apply 1 application topically 2 (two) times daily as needed (wound care).   Unknown   Oxycodone HCl 20 MG TABS Take 5 mg by mouth See admin instructions. Takes 1/4 of a pill 4 times a day  0 Unknown   pantoprazole (PROTONIX) 40 MG tablet Take 40 mg by mouth daily.   12/26/2022   predniSONE (DELTASONE) 20 MG tablet Take 20 mg by mouth daily as needed (unknown).   Unknown   ROCKLATAN 0.02-0.005 % SOLN Place 1 drop into both eyes at bedtime.   12/26/2022   scopolamine (TRANSDERM-SCOP) 1 MG/3DAYS 1 patch every 3 (three) days.   Unknown   Simethicone 180 MG CAPS Take by mouth as needed.  Unknown   SPIRIVA HANDIHALER 18 MCG inhalation capsule Place 18 mcg into inhaler and inhale as needed.   Unknown   sucralfate (CARAFATE) 1 GM/10ML suspension Take 1 g by mouth 2 (two) times daily.   12/26/2022   tamsulosin (FLOMAX) 0.4 MG  CAPS capsule Take 1 capsule (0.4 mg total) by mouth daily. 90 capsule 3 12/26/2022   timolol (TIMOPTIC) 0.5 % ophthalmic solution Place 1 drop into both eyes 2 (two) times daily as needed (eye pressure).   12/26/2022   atenolol-chlorthalidone (TENORETIC) 50-25 MG tablet Take 1 tablet by mouth at bedtime.  (Patient not taking: Reported on 12/27/2022)   Not Taking   capsaicin (ZOSTRIX) 0.025 % cream Apply 1 application topically daily as needed (Skin Rash). (Patient not taking: Reported on 12/27/2022)   Not Taking   donepezil (ARICEPT) 10 MG tablet Take 10 mg by mouth daily. (Patient not taking: Reported on 12/27/2022)   Not Taking   doxycycline (VIBRAMYCIN) 100 MG capsule Take 1 capsule (100 mg total) by mouth every 12 (twelve) hours. (Patient not taking: Reported on 12/27/2022) 14 capsule 5 Not Taking   FREESTYLE LITE test strip       hydroquinone 4 % cream Apply 1 application topically daily as needed (Skin Rash). (Patient not taking: Reported on 12/27/2022)   Not Taking   ibuprofen (ADVIL) 600 MG tablet Take 600 mg by mouth every 6 (six) hours as needed for headache. (Patient not taking: Reported on 12/27/2022)   Not Taking   Incontinence Supply Disposable ([employer] MENS GUARD) MISC 1 Pad by Does not apply route 2 (two) times daily. 60 each 11    Incontinence Supply Disposable (DEPEND REAL FIT/BRIEF/MEN/L-XL) MISC 1 Pad by Does not apply route in the morning and at bedtime. 60 each 11    Lancets (FREESTYLE) lancets       LANTUS SOLOSTAR 100 UNIT/ML Solostar Pen Inject 2.5 Units into the skin at bedtime. (Patient not taking: Reported on 12/27/2022)   Not Taking   lidocaine (LIDODERM) 5 % Place 1 patch onto the skin daily. Remove & Discard patch within 12 hours or as directed by MD (Patient not taking: Reported on 12/27/2022) 30 patch 0 Not Taking   mirabegron ER (MYRBETRIQ) 25 MG TB24 tablet Take 25 mg by mouth. (Patient not taking: Reported on 12/27/2022)   Not Taking   mupirocin ointment (BACTROBAN) 2 % Apply 1  application topically 2 (two) times daily as needed (Rash). (Patient not taking: Reported on 12/27/2022)   Not Taking   nystatin ointment (MYCOSTATIN) 1 application daily as needed (Skin Rash). (Patient not taking: Reported on 12/27/2022)   Not Taking   nystatin-triamcinolone ointment (MYCOLOG) Apply 1 application topically 2 (two) times daily. To groins (Patient not taking: Reported on 12/27/2022) 30 g 11 Not Taking   pantoprazole (PROTONIX) 20 MG tablet Take 20 mg by mouth daily as needed for heartburn or indigestion. (Patient not taking: Reported on 12/27/2022)   Not Taking   psyllium (METAMUCIL SMOOTH TEXTURE) 58.6 % powder Take 1 packet by mouth daily. (Patient not taking: Reported on 12/27/2022)   Not Taking   RHOPRESSA 0.02 % SOLN Place 1 drop into the right eye at bedtime. (Patient not taking: Reported on 12/27/2022)   Not Taking   terbinafine (LAMISIL) 250 MG tablet Take 250 mg by mouth 2 (two) times daily. (Patient not taking: Reported on 12/27/2022)   Not Taking   VIAGRA 100 MG tablet Take 1 tablet (100 mg total) by mouth daily as needed  for erectile dysfunction. (Patient not taking: Reported on 12/27/2022) 30 tablet 6 Not Taking    Assessment: Pharmacy is consulted to start heparin on 82 yo male diagnosed with atrial fibrillation. Pt had been started on Eliquis on 10/3 for new onset atrial fibrillation with last dose being on 10/3 at 2139. Eliquis is now to be held as pt will need a lesion biopsy and will need an Eliquis washout.   10/4: Per IR consult, post-procedure bleeding risk is standard. Will resume heparin drip 6 hours after end of procedure   12/29/2022 aPTT 74 therapeutic on 1450 units/hr HL 0.68 also therapeutic Hgb 10.5, plts 249 No bleeding or interruptions per RN  Goal of Therapy:  aPTT 66-102 sec HL 0.3-0.7 Monitor platelets by anticoagulation protocol: Yes   Plan:  Continue heparin drip at 1450 units/hr  Obtain HL/aPTT in 8 hours Daily CBC while on heparin  drip Monitor for signs and symptoms of bleeding   Arley Phenix RPh 12/29/2022, 5:31 AM

## 2022-12-30 ENCOUNTER — Observation Stay (HOSPITAL_BASED_OUTPATIENT_CLINIC_OR_DEPARTMENT_OTHER): Payer: Medicare PPO

## 2022-12-30 DIAGNOSIS — E1143 Type 2 diabetes mellitus with diabetic autonomic (poly)neuropathy: Secondary | ICD-10-CM | POA: Diagnosis not present

## 2022-12-30 DIAGNOSIS — I4891 Unspecified atrial fibrillation: Secondary | ICD-10-CM | POA: Diagnosis not present

## 2022-12-30 DIAGNOSIS — C61 Malignant neoplasm of prostate: Secondary | ICD-10-CM | POA: Diagnosis not present

## 2022-12-30 DIAGNOSIS — I10 Essential (primary) hypertension: Secondary | ICD-10-CM | POA: Diagnosis not present

## 2022-12-30 DIAGNOSIS — I480 Paroxysmal atrial fibrillation: Secondary | ICD-10-CM | POA: Diagnosis not present

## 2022-12-30 DIAGNOSIS — I714 Abdominal aortic aneurysm, without rupture, unspecified: Secondary | ICD-10-CM | POA: Diagnosis not present

## 2022-12-30 DIAGNOSIS — J439 Emphysema, unspecified: Secondary | ICD-10-CM | POA: Diagnosis not present

## 2022-12-30 DIAGNOSIS — R413 Other amnesia: Secondary | ICD-10-CM | POA: Diagnosis not present

## 2022-12-30 DIAGNOSIS — N1831 Chronic kidney disease, stage 3a: Secondary | ICD-10-CM | POA: Diagnosis not present

## 2022-12-30 LAB — CBC WITH DIFFERENTIAL/PLATELET
Abs Immature Granulocytes: 0.04 10*3/uL (ref 0.00–0.07)
Basophils Absolute: 0 10*3/uL (ref 0.0–0.1)
Basophils Relative: 0 %
Eosinophils Absolute: 0.3 10*3/uL (ref 0.0–0.5)
Eosinophils Relative: 4 %
HCT: 31.9 % — ABNORMAL LOW (ref 39.0–52.0)
Hemoglobin: 10 g/dL — ABNORMAL LOW (ref 13.0–17.0)
Immature Granulocytes: 0 %
Lymphocytes Relative: 16 %
Lymphs Abs: 1.4 10*3/uL (ref 0.7–4.0)
MCH: 28.8 pg (ref 26.0–34.0)
MCHC: 31.3 g/dL (ref 30.0–36.0)
MCV: 91.9 fL (ref 80.0–100.0)
Monocytes Absolute: 0.6 10*3/uL (ref 0.1–1.0)
Monocytes Relative: 7 %
Neutro Abs: 6.5 10*3/uL (ref 1.7–7.7)
Neutrophils Relative %: 73 %
Platelets: 233 10*3/uL (ref 150–400)
RBC: 3.47 MIL/uL — ABNORMAL LOW (ref 4.22–5.81)
RDW: 14.9 % (ref 11.5–15.5)
WBC: 8.9 10*3/uL (ref 4.0–10.5)
nRBC: 0 % (ref 0.0–0.2)

## 2022-12-30 LAB — URINALYSIS, COMPLETE (UACMP) WITH MICROSCOPIC
Bacteria, UA: NONE SEEN
Bilirubin Urine: NEGATIVE
Glucose, UA: NEGATIVE mg/dL
Hgb urine dipstick: NEGATIVE
Ketones, ur: NEGATIVE mg/dL
Leukocytes,Ua: NEGATIVE
Nitrite: NEGATIVE
Protein, ur: NEGATIVE mg/dL
Specific Gravity, Urine: 1.014 (ref 1.005–1.030)
pH: 8 (ref 5.0–8.0)

## 2022-12-30 LAB — RETICULOCYTES
Immature Retic Fract: 27.9 % — ABNORMAL HIGH (ref 2.3–15.9)
RBC.: 3.44 MIL/uL — ABNORMAL LOW (ref 4.22–5.81)
Retic Count, Absolute: 57.8 10*3/uL (ref 19.0–186.0)
Retic Ct Pct: 1.7 % (ref 0.4–3.1)

## 2022-12-30 LAB — COMPREHENSIVE METABOLIC PANEL
ALT: 10 U/L (ref 0–44)
AST: 13 U/L — ABNORMAL LOW (ref 15–41)
Albumin: 2.6 g/dL — ABNORMAL LOW (ref 3.5–5.0)
Alkaline Phosphatase: 59 U/L (ref 38–126)
Anion gap: 7 (ref 5–15)
BUN: 11 mg/dL (ref 8–23)
CO2: 25 mmol/L (ref 22–32)
Calcium: 7.9 mg/dL — ABNORMAL LOW (ref 8.9–10.3)
Chloride: 104 mmol/L (ref 98–111)
Creatinine, Ser: 1.23 mg/dL (ref 0.61–1.24)
GFR, Estimated: 59 mL/min — ABNORMAL LOW (ref >60)
Glucose, Bld: 96 mg/dL (ref 70–99)
Potassium: 3.8 mmol/L (ref 3.5–5.1)
Sodium: 136 mmol/L (ref 135–145)
Total Bilirubin: 0.7 mg/dL (ref 0.3–1.2)
Total Protein: 6.4 g/dL — ABNORMAL LOW (ref 6.5–8.1)

## 2022-12-30 LAB — ECHOCARDIOGRAM COMPLETE
AV Mean grad: 1 mm[Hg]
AV Peak grad: 2.2 mm[Hg]
Ao pk vel: 0.74 m/s
Area-P 1/2: 3.02 cm2
Est EF: 55
Height: 80 in
Weight: 3460.34 [oz_av]

## 2022-12-30 LAB — FOLATE: Folate: 4.7 ng/mL — ABNORMAL LOW (ref >5.9)

## 2022-12-30 LAB — GLUCOSE, CAPILLARY
Glucose-Capillary: 97 mg/dL (ref 70–99)
Glucose-Capillary: 90 mg/dL (ref 70–99)

## 2022-12-30 LAB — PHOSPHORUS: Phosphorus: 3.1 mg/dL (ref 2.5–4.6)

## 2022-12-30 LAB — IRON AND TIBC
Iron: 30 ug/dL — ABNORMAL LOW (ref 45–182)
Saturation Ratios: 16 % — ABNORMAL LOW (ref 17.9–39.5)
TIBC: 183 ug/dL — ABNORMAL LOW (ref 250–450)
UIBC: 153 ug/dL

## 2022-12-30 LAB — VITAMIN B12: Vitamin B-12: 187 pg/mL (ref 180–914)

## 2022-12-30 LAB — MAGNESIUM: Magnesium: 2 mg/dL (ref 1.7–2.4)

## 2022-12-30 LAB — FERRITIN: Ferritin: 154 ng/mL (ref 24–336)

## 2022-12-30 MED ORDER — FOLIC ACID 1 MG PO TABS: 1.0000 mg | ORAL_TABLET | Freq: Every day | ORAL | Status: DC

## 2022-12-30 MED ORDER — FOLIC ACID 1 MG PO TABS: 1.0000 mg | ORAL_TABLET | Freq: Every day | ORAL | 0 refills | Status: DC

## 2022-12-30 MED ORDER — ENSURE ENLIVE PO LIQD: 237.0000 mL | Freq: Two times a day (BID) | ORAL | 12 refills | Status: DC

## 2022-12-30 MED ORDER — POLYETHYLENE GLYCOL 3350 17 G PO PACK: 17.0000 g | PACK | Freq: Every day | ORAL | 0 refills | Status: DC | PRN

## 2022-12-30 MED ORDER — POLYSACCHARIDE IRON COMPLEX 150 MG PO CAPS: 150.0000 mg | ORAL_CAPSULE | Freq: Every day | ORAL | Status: DC

## 2022-12-30 MED ORDER — ACETAMINOPHEN 325 MG PO TABS: 650.0000 mg | ORAL_TABLET | Freq: Four times a day (QID) | ORAL | 0 refills | Status: DC | PRN

## 2022-12-30 MED ORDER — METOPROLOL TARTRATE 25 MG PO TABS: 25.0000 mg | ORAL_TABLET | Freq: Two times a day (BID) | ORAL | 0 refills | Status: DC

## 2022-12-30 MED ORDER — PERFLUTREN LIPID MICROSPHERE
1.0000 mL | INTRAVENOUS | Status: AC | PRN
  Administered 2022-12-30: 5 mL via INTRAVENOUS

## 2022-12-30 MED ORDER — POLYSACCHARIDE IRON COMPLEX 150 MG PO CAPS: 150.0000 mg | ORAL_CAPSULE | Freq: Every day | ORAL | 0 refills | Status: DC

## 2022-12-30 MED ORDER — APIXABAN 5 MG PO TABS: 5.0000 mg | ORAL_TABLET | Freq: Two times a day (BID) | ORAL | 0 refills | Status: DC

## 2022-12-30 MED ORDER — ONDANSETRON HCL 4 MG PO TABS: 4.0000 mg | ORAL_TABLET | Freq: Four times a day (QID) | ORAL | 0 refills | Status: DC | PRN

## 2022-12-30 NOTE — Evaluation (Signed)
Occupational Therapy Evaluation Patient Details Name: Jonathan [name] MRN: [id number] DOB: 30-Nov-1940 Today's Date: 12/30/2022   History of Present Illness Jonathan [name] is a 82 yr old male who presents with abdominal discomfort, decreased appetite, and generalized weakness. Pt s/p CT guided biopsy of infiltrative right iliac lesion.[name] PMH: prostate cancer s/p EXRT and ADT, cancer of the right lung s/p lobectomy and left upper lobe nodule status post SBR in 2017, CKD 3A, HTN, COPD, memory loss, and an episode of rapid atrial fibrillation following right upper lobectomy in 2017   Clinical Impression   The pt is currently presenting with the below listed deficits, which compromise his ADL performance and overall functional independence (see OT problem list). During the session today, he required assist for lower body dressing, sit to stand using a RW, for ambulating in his room using a RW, and for toileting at bathroom level. He reported having significant chronic R ankle pain, shortness of breath with activity, and feelings of generalized weakness. He will benefit from OT services to maximize his ADL performance and to decrease the risk for further weakness and deconditioning. Home health therapy and home health aide services are recommended.        If plan is discharge home, recommend the following: A little help with walking and/or transfers;Assistance with cooking/housework;A little help with bathing/dressing/bathroom;Direct supervision/assist for medications management    Functional Status Assessment  Patient has had a recent decline in their functional status and demonstrates the ability to make significant improvements in function in a reasonable and predictable amount of time.  Equipment Recommendations  None recommended by OT    Recommendations for Other Services       Precautions / Restrictions Precautions Precautions: Fall Restrictions Weight Bearing Restrictions: No       Mobility Bed Mobility Overal bed mobility: Needs Assistance Bed Mobility: Supine to Sit, Sit to Supine     Supine to sit: Supervision, Used rails Sit to supine: Supervision        Transfers Overall transfer level: Needs assistance Equipment used: Rolling walker (2 wheels) Transfers: Sit to/from Stand Sit to Stand: Contact guard assist, From elevated surface                  Balance     Sitting balance-Leahy Scale: Good         Standing balance comment: CGA using a RW           ADL either performed or assessed with clinical judgement   ADL Overall ADL's : Needs assistance/impaired Eating/Feeding: Set up;Sitting Eating/Feeding Details (indicate cue type and reason): based on clinical judgement; he reports use of built-up utensils for feeding, given R hand deficits Grooming: Set up;Sitting Grooming Details (indicate cue type and reason): simulated Upper Body Bathing: Minimal assistance Upper Body Bathing Details (indicate cue type and reason): based on clinical judgement Lower Body Bathing: Minimal assistance;Moderate assistance Lower Body Bathing Details (indicate cue type and reason): based on clinical judgement Upper Body Dressing : Minimal assistance;Sitting Upper Body Dressing Details (indicate cue type and reason): simulated in sitting Lower Body Dressing: Minimal assistance;Bed level;Sit to/from stand Lower Body Dressing Details (indicate cue type and reason): Pt doffed then donned a brief seated on the toilet, then in standing to pull them up over his hips. He further demonstrated bridging at bed level, in order to donn a pair of pajama pants. Toilet Transfer: Contact guard assist;Regular Toilet;Grab bars;Ambulation;Rolling walker (2 wheels) Toilet Transfer Details (indicate cue type and reason):  Performed at bathroom level. Toileting- Clothing Manipulation and Hygiene: Minimal assistance;Sit to/from stand Toileting - Clothing Manipulation Details  (indicate cue type and reason): Pt urinated only, so hygiene was not assessed. Tasks performed at bathroom level.              Pertinent Vitals/Pain Pain Assessment Pain Assessment: 0-10 Pain Score: 10-Worst pain ever Pain Location: chronic R ankle pain Pain Intervention(s): Limited activity within patient's tolerance, Monitored during session     Extremity/Trunk Assessment Upper Extremity Assessment Upper Extremity Assessment: Right hand dominant;RUE deficits/detail RUE Deficits / Details: He reported chronic nerve damage from a fall a few yrs ago, causing decreased grip strength and impaired fine motor coordination.   Lower Extremity Assessment Lower Extremity Assessment: LLE deficits/detail;RLE deficits/detail RLE Deficits / Details: AROM WFL. Slight generalized weakness. He reported chronic neuropathy, causing total numbness of his feet RLE Sensation: history of peripheral neuropathy LLE Deficits / Details: AROM WFL. Slight generalized weakness. He reported chronic neuropathy, causing total numbness of his feet LLE Sensation: history of peripheral neuropathy       Communication Communication Communication: No apparent difficulties   Cognition Arousal: Alert Behavior During Therapy: WFL for tasks assessed/performed Overall Cognitive Status: Within Functional Limits for tasks assessed            General Comments: Oriented x4, able to follow 1-2 step commands without difficulty                Home Living Family/patient expects to be discharged to:: Private residence Living Arrangements: Spouse/significant other and Grandson   Type of Home: House Home Access: Ramped entrance     Home Layout: One level     Bathroom Shower/Tub: Producer, television/film/video: Handicapped height     Home Equipment: Shower seat - built in;Rollator (4 wheels);Electric scooter;Crutches;Hospital bed. He reports his hospital bed is not long enough for him.            Prior Functioning/Environment Prior Level of Function : Needs assist             Mobility Comments: He reported using crutches for household ambulation (limited distance due to chronic R ankle pain) and a scooter in the community. ADLs Comments: Pt reported needing assist from his spouse for dressing and bathing; he could manage toileting without assist, though he wore briefs/depends. His spouse managed the cooking and cleaning.        OT Problem List: Decreased strength;Impaired balance (sitting and/or standing);Decreased coordination;Impaired sensation;Pain      OT Treatment/Interventions: Self-care/ADL training;Therapeutic exercise;Energy conservation;DME and/or AE instruction;Therapeutic activities;Patient/family education;Balance training    OT Goals(Current goals can be found in the care plan section) Acute Rehab OT Goals OT Goal Formulation: With patient/family Time For Goal Achievement: 01/13/23 Potential to Achieve Goals: Good ADL Goals Pt Will Perform Grooming: with supervision;standing Pt Will Perform Upper Body Dressing: with set-up;sitting Pt Will Transfer to Toilet: with supervision;ambulating;grab bars Pt Will Perform Toileting - Clothing Manipulation and hygiene: with supervision;sit to/from stand  OT Frequency: Min 1X/week       AM-PAC OT "6 Clicks" Daily Activity     Outcome Measure Help from another person eating meals?: A Little Help from another person taking care of personal grooming?: A Little Help from another person toileting, which includes using toliet, bedpan, or urinal?: A Little Help from another person bathing (including washing, rinsing, drying)?: A Lot Help from another person to put on and taking off regular upper body clothing?: A Little Help from  another person to put on and taking off regular lower body clothing?: A Little 6 Click Score: 17   End of Session Equipment Utilized During Treatment: Rolling walker (2 wheels) Nurse  Communication: Mobility status  Activity Tolerance: Patient tolerated treatment well Patient left: in bed;with call bell/phone within reach;with chair alarm set;with family/visitor present  OT Visit Diagnosis: Muscle weakness (generalized) (M62.81);Pain;Unsteadiness on feet (R26.81) Pain - Right/Left: Right Pain - part of body:  (ankle)                Time: 1005-1050 OT Time Calculation (min): 45 min Charges:  OT General Charges $OT Visit: 1 Visit OT Evaluation $OT Eval Moderate Complexity: 1 Mod OT Treatments $Self Care/Home Management : 8-22 mins    Marella Bile, OTR/L 12/30/2022, 12:33 PM

## 2022-12-30 NOTE — Plan of Care (Signed)
  Problem: Education: Goal: Knowledge of disease or condition will improve Outcome: Progressing   Problem: Activity: Goal: Ability to tolerate increased activity will improve Outcome: Progressing   Problem: Coping: Goal: Ability to adjust to condition or change in health will improve Outcome: Progressing   Problem: Fluid Volume: Goal: Ability to maintain a balanced intake and output will improve Outcome: Progressing

## 2022-12-30 NOTE — Progress Notes (Signed)
AVS and discharge instructions reviewed with patient and wife at the bedside. Both parties verbalized understanding and had no further questions.

## 2022-12-30 NOTE — Plan of Care (Signed)
Monitor labs and vital signs. Reinforce education. Continue telemetry monitoring. Monitor blood glucose levels as ordered. Continue current plan of care.

## 2022-12-30 NOTE — Discharge Summary (Signed)
needed for muscle spasms.   metoCLOPramide 5 MG tablet Commonly known as: REGLAN Take 5 mg by mouth daily as needed for vomiting or nausea.   metoprolol tartrate 25 MG tablet Commonly known as: LOPRESSOR Take 1 tablet (25 mg total) by mouth 2 (two) times daily.   mupirocin cream 2 % Commonly known as: BACTROBAN Apply 1 application topically 2 (two) times daily as needed (wound care).   ondansetron 4 MG tablet Commonly known as: ZOFRAN Take 1 tablet (4 mg total) by mouth every 6 (six) hours as needed for nausea.   Oxycodone HCl 20 MG Tabs Take 5 mg by mouth See admin instructions. Takes 1/4 of a pill 4 times a day   pantoprazole 40 MG tablet Commonly known as: PROTONIX Take 40 mg by mouth daily. What changed: Another medication with the same name was removed. Continue taking this medication, and follow the directions you see here.   polyethylene glycol 17 g packet Commonly known as: MIRALAX / GLYCOLAX Take 17 g by mouth daily as needed for mild constipation.   potassium chloride SA 20 MEQ tablet Commonly known as: KLOR-CON M Take 1 tablet (20 mEq total) by mouth 2 (two) times daily.   predniSONE 20 MG tablet Commonly known as: DELTASONE Take 20 mg by mouth daily as needed (unknown).   Rocklatan 0.02-0.005 % Soln Generic drug:  Netarsudil-Latanoprost Place 1 drop into both eyes at bedtime.   scopolamine 1 MG/3DAYS Commonly known as: TRANSDERM-SCOP 1 patch every 3 (three) days.   Simethicone 180 MG Caps Take by mouth as needed.   Spiriva HandiHaler 18 MCG inhalation capsule Generic drug: tiotropium Place 18 mcg into inhaler and inhale as needed.   sucralfate 1 GM/10ML suspension Commonly known as: CARAFATE Take 1 g by mouth 2 (two) times daily.   tamsulosin 0.4 MG Caps capsule Commonly known as: FLOMAX Take 1 capsule (0.4 mg total) by mouth daily.   timolol 0.5 % ophthalmic solution Commonly known as: TIMOPTIC Place 1 drop into both eyes 2 (two) times daily as needed (eye pressure).        Follow-up Information     Sharlene Dory, NP Follow up.   Specialty: Cardiology Why: Please keep your appointment with Cardiology Sharlene Dory, NP) on 01/11/2023 at 9:30am which you previously had scheduled. Contact information: [address] [name] [address] [phone]         Care, [facility] Follow up.   Specialty: Home Health Services Why: to provide home therapy visits Contact information: [address] [name] [address] [phone]                Discharge Exam: Filed Weights   12/27/22 1626 12/28/22 0458 12/30/22 0500  Weight: 93.9 kg 97 kg 98.1 kg   ***  Condition at discharge: {DC Condition:26389}  The results of significant diagnostics from this hospitalization (including imaging, microbiology, ancillary and laboratory) are listed below for reference.   Imaging Studies: ECHOCARDIOGRAM COMPLETE  Result Date: 12/30/2022    ECHOCARDIOGRAM REPORT   Patient Name:   Jonathan [name] Date of Exam: 12/30/2022 Medical Rec #:  [id number]         Height:       80.0 in Accession #:    [id number]        Weight:       216.3 lb Date of Birth:  April 18, 1940         BSA:  needed for muscle spasms.   metoCLOPramide 5 MG tablet Commonly known as: REGLAN Take 5 mg by mouth daily as needed for vomiting or nausea.   metoprolol tartrate 25 MG tablet Commonly known as: LOPRESSOR Take 1 tablet (25 mg total) by mouth 2 (two) times daily.   mupirocin cream 2 % Commonly known as: BACTROBAN Apply 1 application topically 2 (two) times daily as needed (wound care).   ondansetron 4 MG tablet Commonly known as: ZOFRAN Take 1 tablet (4 mg total) by mouth every 6 (six) hours as needed for nausea.   Oxycodone HCl 20 MG Tabs Take 5 mg by mouth See admin instructions. Takes 1/4 of a pill 4 times a day   pantoprazole 40 MG tablet Commonly known as: PROTONIX Take 40 mg by mouth daily. What changed: Another medication with the same name was removed. Continue taking this medication, and follow the directions you see here.   polyethylene glycol 17 g packet Commonly known as: MIRALAX / GLYCOLAX Take 17 g by mouth daily as needed for mild constipation.   potassium chloride SA 20 MEQ tablet Commonly known as: KLOR-CON M Take 1 tablet (20 mEq total) by mouth 2 (two) times daily.   predniSONE 20 MG tablet Commonly known as: DELTASONE Take 20 mg by mouth daily as needed (unknown).   Rocklatan 0.02-0.005 % Soln Generic drug:  Netarsudil-Latanoprost Place 1 drop into both eyes at bedtime.   scopolamine 1 MG/3DAYS Commonly known as: TRANSDERM-SCOP 1 patch every 3 (three) days.   Simethicone 180 MG Caps Take by mouth as needed.   Spiriva HandiHaler 18 MCG inhalation capsule Generic drug: tiotropium Place 18 mcg into inhaler and inhale as needed.   sucralfate 1 GM/10ML suspension Commonly known as: CARAFATE Take 1 g by mouth 2 (two) times daily.   tamsulosin 0.4 MG Caps capsule Commonly known as: FLOMAX Take 1 capsule (0.4 mg total) by mouth daily.   timolol 0.5 % ophthalmic solution Commonly known as: TIMOPTIC Place 1 drop into both eyes 2 (two) times daily as needed (eye pressure).        Follow-up Information     Sharlene Dory, NP Follow up.   Specialty: Cardiology Why: Please keep your appointment with Cardiology Sharlene Dory, NP) on 01/11/2023 at 9:30am which you previously had scheduled. Contact information: [address] [name] [address] [phone]         Care, [facility] Follow up.   Specialty: Home Health Services Why: to provide home therapy visits Contact information: [address] [name] [address] [phone]                Discharge Exam: Filed Weights   12/27/22 1626 12/28/22 0458 12/30/22 0500  Weight: 93.9 kg 97 kg 98.1 kg   ***  Condition at discharge: {DC Condition:26389}  The results of significant diagnostics from this hospitalization (including imaging, microbiology, ancillary and laboratory) are listed below for reference.   Imaging Studies: ECHOCARDIOGRAM COMPLETE  Result Date: 12/30/2022    ECHOCARDIOGRAM REPORT   Patient Name:   Jonathan [name] Date of Exam: 12/30/2022 Medical Rec #:  [id number]         Height:       80.0 in Accession #:    [id number]        Weight:       216.3 lb Date of Birth:  April 18, 1940         BSA:  Physician Discharge Summary   Patient: Jonathan [name] MRN: [id number] DOB: 1940/07/07  Admit date:     12/26/2022  Discharge date: 12/30/22  Discharge Physician: Marguerita Merles, DO   PCP: Lianne Moris, PA-C   Recommendations at discharge:  {Tip this will not be part of the note when signed- Example include specific recommendations for outpatient follow-up, pending tests to follow-up on. (Optional):26781}  ***  Discharge Diagnoses: Principal Problem:   Rapid atrial fibrillation (HCC) Active Problems:   Essential hypertension   Emphysema of lung (HCC)   Lung cancer (HCC)   Malignant neoplasm of prostate (HCC)   Mild memory disturbance   Type 2 diabetes mellitus with diabetic autonomic (poly)neuropathy (HCC)   AAA (abdominal aortic aneurysm) (HCC)   CKD stage 3a, GFR 45-59 ml/min (HCC)  Resolved Problems:   * No resolved hospital problems. [facility] Course: The patient is an 82 year old African-American male with a past medical history significant for but not limited to prostate cancer status post XRT and ADT and cancer of the right lung status post lobectomy and left upper lobe nodule status post SVR in 2017, history of CKD stage IIIa, hypertension, COPD, memory loss and a history of episode of rapid atrial fibrillation following his right upper lobe lobectomy who presented with abdominal discomfort and loss of appetite.  He presented with multiple vague complaints including abdominal discomfort and generalized weakness.  Family had noted that he has not been eating or drinking much recently and thinks that the abdominal pain and discomfort has been present for weeks to months and has been intermittent with associated nausea but no vomiting, diarrhea fevers or chills.  Patient has denied any chest pain or lightheadedness or dizziness.  On arrival to the ED is found to be afebrile and saturating well on room air.  He subsequently went into rapid atrial fibrillation with RVR with rates  in the 140s in the ED.  Labs were notable for potassium 3.1 and a creatinine 1.27 normal troponins and a normal WBC.  CT scan of the abdomen pelvis was done and demonstrated 3.9 cm AAA and lucent bone lesions.  He was treated with potassium chloride Pepcid acetaminophen and was given Lopressor and started on Eliquis.  Subsequently urology was consulted and recommended getting a biopsy and then he was transitioned off of the Eliquis to heparin drip.  Interventional radiology evaluated and patient underwent a CT-guided right iliac bone biopsy.  He is doing well and his heart rate has converted to normal sinus rhythm.  Currently awaiting echocardiogram and anticipate discharging after this is done if it is has no issues as PT OT recommending home health.   Assessment and Plan:  Atrial Fibrillation with RVR status post conversion to normal sinus rhythm -Hx of rapid a fib following RULobectomy in 2017, not anticoagulated pta  -Given IV Lopressor and started on Eliquis in ED however he is transition to heparin drip today -Continue Eliquis, supplement potassium, check magnesium level and TSH, use IV Lopressor as needed   -Hesitant to start Diltiazem gtt given low BP -Ordered Trop and was 9 and 10; ECHO and echocardiogram is still pending to be done -Cardiology consulted for further evaluation and recommendations and they are recommending metoprolol tartrate 25 mg p.o. twice daily and we have now transitioned back to Eliquis 5 mg p.o. twice daily and off of Heparin gtt   Lucent bone lesions; Hx of lung and prostate cancer  -Noted on CT in ED  -Discussed next steps with  needed for muscle spasms.   metoCLOPramide 5 MG tablet Commonly known as: REGLAN Take 5 mg by mouth daily as needed for vomiting or nausea.   metoprolol tartrate 25 MG tablet Commonly known as: LOPRESSOR Take 1 tablet (25 mg total) by mouth 2 (two) times daily.   mupirocin cream 2 % Commonly known as: BACTROBAN Apply 1 application topically 2 (two) times daily as needed (wound care).   ondansetron 4 MG tablet Commonly known as: ZOFRAN Take 1 tablet (4 mg total) by mouth every 6 (six) hours as needed for nausea.   Oxycodone HCl 20 MG Tabs Take 5 mg by mouth See admin instructions. Takes 1/4 of a pill 4 times a day   pantoprazole 40 MG tablet Commonly known as: PROTONIX Take 40 mg by mouth daily. What changed: Another medication with the same name was removed. Continue taking this medication, and follow the directions you see here.   polyethylene glycol 17 g packet Commonly known as: MIRALAX / GLYCOLAX Take 17 g by mouth daily as needed for mild constipation.   potassium chloride SA 20 MEQ tablet Commonly known as: KLOR-CON M Take 1 tablet (20 mEq total) by mouth 2 (two) times daily.   predniSONE 20 MG tablet Commonly known as: DELTASONE Take 20 mg by mouth daily as needed (unknown).   Rocklatan 0.02-0.005 % Soln Generic drug:  Netarsudil-Latanoprost Place 1 drop into both eyes at bedtime.   scopolamine 1 MG/3DAYS Commonly known as: TRANSDERM-SCOP 1 patch every 3 (three) days.   Simethicone 180 MG Caps Take by mouth as needed.   Spiriva HandiHaler 18 MCG inhalation capsule Generic drug: tiotropium Place 18 mcg into inhaler and inhale as needed.   sucralfate 1 GM/10ML suspension Commonly known as: CARAFATE Take 1 g by mouth 2 (two) times daily.   tamsulosin 0.4 MG Caps capsule Commonly known as: FLOMAX Take 1 capsule (0.4 mg total) by mouth daily.   timolol 0.5 % ophthalmic solution Commonly known as: TIMOPTIC Place 1 drop into both eyes 2 (two) times daily as needed (eye pressure).        Follow-up Information     Sharlene Dory, NP Follow up.   Specialty: Cardiology Why: Please keep your appointment with Cardiology Sharlene Dory, NP) on 01/11/2023 at 9:30am which you previously had scheduled. Contact information: [address] [name] [address] [phone]         Care, [facility] Follow up.   Specialty: Home Health Services Why: to provide home therapy visits Contact information: [address] [name] [address] [phone]                Discharge Exam: Filed Weights   12/27/22 1626 12/28/22 0458 12/30/22 0500  Weight: 93.9 kg 97 kg 98.1 kg   ***  Condition at discharge: {DC Condition:26389}  The results of significant diagnostics from this hospitalization (including imaging, microbiology, ancillary and laboratory) are listed below for reference.   Imaging Studies: ECHOCARDIOGRAM COMPLETE  Result Date: 12/30/2022    ECHOCARDIOGRAM REPORT   Patient Name:   Jonathan [name] Date of Exam: 12/30/2022 Medical Rec #:  [id number]         Height:       80.0 in Accession #:    [id number]        Weight:       216.3 lb Date of Birth:  April 18, 1940         BSA:  needed for muscle spasms.   metoCLOPramide 5 MG tablet Commonly known as: REGLAN Take 5 mg by mouth daily as needed for vomiting or nausea.   metoprolol tartrate 25 MG tablet Commonly known as: LOPRESSOR Take 1 tablet (25 mg total) by mouth 2 (two) times daily.   mupirocin cream 2 % Commonly known as: BACTROBAN Apply 1 application topically 2 (two) times daily as needed (wound care).   ondansetron 4 MG tablet Commonly known as: ZOFRAN Take 1 tablet (4 mg total) by mouth every 6 (six) hours as needed for nausea.   Oxycodone HCl 20 MG Tabs Take 5 mg by mouth See admin instructions. Takes 1/4 of a pill 4 times a day   pantoprazole 40 MG tablet Commonly known as: PROTONIX Take 40 mg by mouth daily. What changed: Another medication with the same name was removed. Continue taking this medication, and follow the directions you see here.   polyethylene glycol 17 g packet Commonly known as: MIRALAX / GLYCOLAX Take 17 g by mouth daily as needed for mild constipation.   potassium chloride SA 20 MEQ tablet Commonly known as: KLOR-CON M Take 1 tablet (20 mEq total) by mouth 2 (two) times daily.   predniSONE 20 MG tablet Commonly known as: DELTASONE Take 20 mg by mouth daily as needed (unknown).   Rocklatan 0.02-0.005 % Soln Generic drug:  Netarsudil-Latanoprost Place 1 drop into both eyes at bedtime.   scopolamine 1 MG/3DAYS Commonly known as: TRANSDERM-SCOP 1 patch every 3 (three) days.   Simethicone 180 MG Caps Take by mouth as needed.   Spiriva HandiHaler 18 MCG inhalation capsule Generic drug: tiotropium Place 18 mcg into inhaler and inhale as needed.   sucralfate 1 GM/10ML suspension Commonly known as: CARAFATE Take 1 g by mouth 2 (two) times daily.   tamsulosin 0.4 MG Caps capsule Commonly known as: FLOMAX Take 1 capsule (0.4 mg total) by mouth daily.   timolol 0.5 % ophthalmic solution Commonly known as: TIMOPTIC Place 1 drop into both eyes 2 (two) times daily as needed (eye pressure).        Follow-up Information     Sharlene Dory, NP Follow up.   Specialty: Cardiology Why: Please keep your appointment with Cardiology Sharlene Dory, NP) on 01/11/2023 at 9:30am which you previously had scheduled. Contact information: [address] [name] [address] [phone]         Care, [facility] Follow up.   Specialty: Home Health Services Why: to provide home therapy visits Contact information: [address] [name] [address] [phone]                Discharge Exam: Filed Weights   12/27/22 1626 12/28/22 0458 12/30/22 0500  Weight: 93.9 kg 97 kg 98.1 kg   ***  Condition at discharge: {DC Condition:26389}  The results of significant diagnostics from this hospitalization (including imaging, microbiology, ancillary and laboratory) are listed below for reference.   Imaging Studies: ECHOCARDIOGRAM COMPLETE  Result Date: 12/30/2022    ECHOCARDIOGRAM REPORT   Patient Name:   Jonathan [name] Date of Exam: 12/30/2022 Medical Rec #:  [id number]         Height:       80.0 in Accession #:    [id number]        Weight:       216.3 lb Date of Birth:  April 18, 1940         BSA:  needed for muscle spasms.   metoCLOPramide 5 MG tablet Commonly known as: REGLAN Take 5 mg by mouth daily as needed for vomiting or nausea.   metoprolol tartrate 25 MG tablet Commonly known as: LOPRESSOR Take 1 tablet (25 mg total) by mouth 2 (two) times daily.   mupirocin cream 2 % Commonly known as: BACTROBAN Apply 1 application topically 2 (two) times daily as needed (wound care).   ondansetron 4 MG tablet Commonly known as: ZOFRAN Take 1 tablet (4 mg total) by mouth every 6 (six) hours as needed for nausea.   Oxycodone HCl 20 MG Tabs Take 5 mg by mouth See admin instructions. Takes 1/4 of a pill 4 times a day   pantoprazole 40 MG tablet Commonly known as: PROTONIX Take 40 mg by mouth daily. What changed: Another medication with the same name was removed. Continue taking this medication, and follow the directions you see here.   polyethylene glycol 17 g packet Commonly known as: MIRALAX / GLYCOLAX Take 17 g by mouth daily as needed for mild constipation.   potassium chloride SA 20 MEQ tablet Commonly known as: KLOR-CON M Take 1 tablet (20 mEq total) by mouth 2 (two) times daily.   predniSONE 20 MG tablet Commonly known as: DELTASONE Take 20 mg by mouth daily as needed (unknown).   Rocklatan 0.02-0.005 % Soln Generic drug:  Netarsudil-Latanoprost Place 1 drop into both eyes at bedtime.   scopolamine 1 MG/3DAYS Commonly known as: TRANSDERM-SCOP 1 patch every 3 (three) days.   Simethicone 180 MG Caps Take by mouth as needed.   Spiriva HandiHaler 18 MCG inhalation capsule Generic drug: tiotropium Place 18 mcg into inhaler and inhale as needed.   sucralfate 1 GM/10ML suspension Commonly known as: CARAFATE Take 1 g by mouth 2 (two) times daily.   tamsulosin 0.4 MG Caps capsule Commonly known as: FLOMAX Take 1 capsule (0.4 mg total) by mouth daily.   timolol 0.5 % ophthalmic solution Commonly known as: TIMOPTIC Place 1 drop into both eyes 2 (two) times daily as needed (eye pressure).        Follow-up Information     Sharlene Dory, NP Follow up.   Specialty: Cardiology Why: Please keep your appointment with Cardiology Sharlene Dory, NP) on 01/11/2023 at 9:30am which you previously had scheduled. Contact information: [address] [name] [address] [phone]         Care, [facility] Follow up.   Specialty: Home Health Services Why: to provide home therapy visits Contact information: [address] [name] [address] [phone]                Discharge Exam: Filed Weights   12/27/22 1626 12/28/22 0458 12/30/22 0500  Weight: 93.9 kg 97 kg 98.1 kg   ***  Condition at discharge: {DC Condition:26389}  The results of significant diagnostics from this hospitalization (including imaging, microbiology, ancillary and laboratory) are listed below for reference.   Imaging Studies: ECHOCARDIOGRAM COMPLETE  Result Date: 12/30/2022    ECHOCARDIOGRAM REPORT   Patient Name:   Jonathan [name] Date of Exam: 12/30/2022 Medical Rec #:  [id number]         Height:       80.0 in Accession #:    [id number]        Weight:       216.3 lb Date of Birth:  April 18, 1940         BSA:  needed for muscle spasms.   metoCLOPramide 5 MG tablet Commonly known as: REGLAN Take 5 mg by mouth daily as needed for vomiting or nausea.   metoprolol tartrate 25 MG tablet Commonly known as: LOPRESSOR Take 1 tablet (25 mg total) by mouth 2 (two) times daily.   mupirocin cream 2 % Commonly known as: BACTROBAN Apply 1 application topically 2 (two) times daily as needed (wound care).   ondansetron 4 MG tablet Commonly known as: ZOFRAN Take 1 tablet (4 mg total) by mouth every 6 (six) hours as needed for nausea.   Oxycodone HCl 20 MG Tabs Take 5 mg by mouth See admin instructions. Takes 1/4 of a pill 4 times a day   pantoprazole 40 MG tablet Commonly known as: PROTONIX Take 40 mg by mouth daily. What changed: Another medication with the same name was removed. Continue taking this medication, and follow the directions you see here.   polyethylene glycol 17 g packet Commonly known as: MIRALAX / GLYCOLAX Take 17 g by mouth daily as needed for mild constipation.   potassium chloride SA 20 MEQ tablet Commonly known as: KLOR-CON M Take 1 tablet (20 mEq total) by mouth 2 (two) times daily.   predniSONE 20 MG tablet Commonly known as: DELTASONE Take 20 mg by mouth daily as needed (unknown).   Rocklatan 0.02-0.005 % Soln Generic drug:  Netarsudil-Latanoprost Place 1 drop into both eyes at bedtime.   scopolamine 1 MG/3DAYS Commonly known as: TRANSDERM-SCOP 1 patch every 3 (three) days.   Simethicone 180 MG Caps Take by mouth as needed.   Spiriva HandiHaler 18 MCG inhalation capsule Generic drug: tiotropium Place 18 mcg into inhaler and inhale as needed.   sucralfate 1 GM/10ML suspension Commonly known as: CARAFATE Take 1 g by mouth 2 (two) times daily.   tamsulosin 0.4 MG Caps capsule Commonly known as: FLOMAX Take 1 capsule (0.4 mg total) by mouth daily.   timolol 0.5 % ophthalmic solution Commonly known as: TIMOPTIC Place 1 drop into both eyes 2 (two) times daily as needed (eye pressure).        Follow-up Information     Sharlene Dory, NP Follow up.   Specialty: Cardiology Why: Please keep your appointment with Cardiology Sharlene Dory, NP) on 01/11/2023 at 9:30am which you previously had scheduled. Contact information: [address] [name] [address] [phone]         Care, [facility] Follow up.   Specialty: Home Health Services Why: to provide home therapy visits Contact information: [address] [name] [address] [phone]                Discharge Exam: Filed Weights   12/27/22 1626 12/28/22 0458 12/30/22 0500  Weight: 93.9 kg 97 kg 98.1 kg   ***  Condition at discharge: {DC Condition:26389}  The results of significant diagnostics from this hospitalization (including imaging, microbiology, ancillary and laboratory) are listed below for reference.   Imaging Studies: ECHOCARDIOGRAM COMPLETE  Result Date: 12/30/2022    ECHOCARDIOGRAM REPORT   Patient Name:   Jonathan [name] Date of Exam: 12/30/2022 Medical Rec #:  [id number]         Height:       80.0 in Accession #:    [id number]        Weight:       216.3 lb Date of Birth:  April 18, 1940         BSA:  needed for muscle spasms.   metoCLOPramide 5 MG tablet Commonly known as: REGLAN Take 5 mg by mouth daily as needed for vomiting or nausea.   metoprolol tartrate 25 MG tablet Commonly known as: LOPRESSOR Take 1 tablet (25 mg total) by mouth 2 (two) times daily.   mupirocin cream 2 % Commonly known as: BACTROBAN Apply 1 application topically 2 (two) times daily as needed (wound care).   ondansetron 4 MG tablet Commonly known as: ZOFRAN Take 1 tablet (4 mg total) by mouth every 6 (six) hours as needed for nausea.   Oxycodone HCl 20 MG Tabs Take 5 mg by mouth See admin instructions. Takes 1/4 of a pill 4 times a day   pantoprazole 40 MG tablet Commonly known as: PROTONIX Take 40 mg by mouth daily. What changed: Another medication with the same name was removed. Continue taking this medication, and follow the directions you see here.   polyethylene glycol 17 g packet Commonly known as: MIRALAX / GLYCOLAX Take 17 g by mouth daily as needed for mild constipation.   potassium chloride SA 20 MEQ tablet Commonly known as: KLOR-CON M Take 1 tablet (20 mEq total) by mouth 2 (two) times daily.   predniSONE 20 MG tablet Commonly known as: DELTASONE Take 20 mg by mouth daily as needed (unknown).   Rocklatan 0.02-0.005 % Soln Generic drug:  Netarsudil-Latanoprost Place 1 drop into both eyes at bedtime.   scopolamine 1 MG/3DAYS Commonly known as: TRANSDERM-SCOP 1 patch every 3 (three) days.   Simethicone 180 MG Caps Take by mouth as needed.   Spiriva HandiHaler 18 MCG inhalation capsule Generic drug: tiotropium Place 18 mcg into inhaler and inhale as needed.   sucralfate 1 GM/10ML suspension Commonly known as: CARAFATE Take 1 g by mouth 2 (two) times daily.   tamsulosin 0.4 MG Caps capsule Commonly known as: FLOMAX Take 1 capsule (0.4 mg total) by mouth daily.   timolol 0.5 % ophthalmic solution Commonly known as: TIMOPTIC Place 1 drop into both eyes 2 (two) times daily as needed (eye pressure).        Follow-up Information     Sharlene Dory, NP Follow up.   Specialty: Cardiology Why: Please keep your appointment with Cardiology Sharlene Dory, NP) on 01/11/2023 at 9:30am which you previously had scheduled. Contact information: [address] [name] [address] [phone]         Care, [facility] Follow up.   Specialty: Home Health Services Why: to provide home therapy visits Contact information: [address] [name] [address] [phone]                Discharge Exam: Filed Weights   12/27/22 1626 12/28/22 0458 12/30/22 0500  Weight: 93.9 kg 97 kg 98.1 kg   ***  Condition at discharge: {DC Condition:26389}  The results of significant diagnostics from this hospitalization (including imaging, microbiology, ancillary and laboratory) are listed below for reference.   Imaging Studies: ECHOCARDIOGRAM COMPLETE  Result Date: 12/30/2022    ECHOCARDIOGRAM REPORT   Patient Name:   Jonathan [name] Date of Exam: 12/30/2022 Medical Rec #:  [id number]         Height:       80.0 in Accession #:    [id number]        Weight:       216.3 lb Date of Birth:  April 18, 1940         BSA:

## 2023-01-02 ENCOUNTER — Ambulatory Visit: Payer: Medicare PPO

## 2023-01-02 LAB — SURGICAL PATHOLOGY

## 2023-01-04 ENCOUNTER — Encounter (INDEPENDENT_AMBULATORY_CARE_PROVIDER_SITE_OTHER): Payer: Self-pay | Admitting: *Deleted

## 2023-01-08 DIAGNOSIS — C61 Malignant neoplasm of prostate: Secondary | ICD-10-CM | POA: Diagnosis not present

## 2023-01-08 DIAGNOSIS — C3491 Malignant neoplasm of unspecified part of right bronchus or lung: Secondary | ICD-10-CM | POA: Diagnosis not present

## 2023-01-08 DIAGNOSIS — M898X5 Other specified disorders of bone, thigh: Secondary | ICD-10-CM | POA: Diagnosis not present

## 2023-01-08 DIAGNOSIS — E1143 Type 2 diabetes mellitus with diabetic autonomic (poly)neuropathy: Secondary | ICD-10-CM | POA: Diagnosis not present

## 2023-01-08 DIAGNOSIS — J439 Emphysema, unspecified: Secondary | ICD-10-CM | POA: Diagnosis not present

## 2023-01-08 DIAGNOSIS — L89896 Pressure-induced deep tissue damage of other site: Secondary | ICD-10-CM | POA: Diagnosis not present

## 2023-01-08 DIAGNOSIS — C3412 Malignant neoplasm of upper lobe, left bronchus or lung: Secondary | ICD-10-CM | POA: Diagnosis not present

## 2023-01-08 DIAGNOSIS — E1122 Type 2 diabetes mellitus with diabetic chronic kidney disease: Secondary | ICD-10-CM | POA: Diagnosis not present

## 2023-01-08 DIAGNOSIS — I4891 Unspecified atrial fibrillation: Secondary | ICD-10-CM | POA: Diagnosis not present

## 2023-01-09 DIAGNOSIS — M898X5 Other specified disorders of bone, thigh: Secondary | ICD-10-CM | POA: Diagnosis not present

## 2023-01-09 DIAGNOSIS — C61 Malignant neoplasm of prostate: Secondary | ICD-10-CM | POA: Diagnosis not present

## 2023-01-09 DIAGNOSIS — C3412 Malignant neoplasm of upper lobe, left bronchus or lung: Secondary | ICD-10-CM | POA: Diagnosis not present

## 2023-01-09 DIAGNOSIS — J439 Emphysema, unspecified: Secondary | ICD-10-CM | POA: Diagnosis not present

## 2023-01-09 DIAGNOSIS — I4891 Unspecified atrial fibrillation: Secondary | ICD-10-CM | POA: Diagnosis not present

## 2023-01-09 DIAGNOSIS — E1122 Type 2 diabetes mellitus with diabetic chronic kidney disease: Secondary | ICD-10-CM | POA: Diagnosis not present

## 2023-01-09 DIAGNOSIS — L89896 Pressure-induced deep tissue damage of other site: Secondary | ICD-10-CM | POA: Diagnosis not present

## 2023-01-09 DIAGNOSIS — E1143 Type 2 diabetes mellitus with diabetic autonomic (poly)neuropathy: Secondary | ICD-10-CM | POA: Diagnosis not present

## 2023-01-09 DIAGNOSIS — J189 Pneumonia, unspecified organism: Secondary | ICD-10-CM | POA: Diagnosis not present

## 2023-01-09 DIAGNOSIS — C3491 Malignant neoplasm of unspecified part of right bronchus or lung: Secondary | ICD-10-CM | POA: Diagnosis not present

## 2023-01-10 ENCOUNTER — Other Ambulatory Visit: Payer: Self-pay | Admitting: Urology

## 2023-01-10 ENCOUNTER — Telehealth: Payer: Self-pay

## 2023-01-10 DIAGNOSIS — E1143 Type 2 diabetes mellitus with diabetic autonomic (poly)neuropathy: Secondary | ICD-10-CM | POA: Diagnosis not present

## 2023-01-10 DIAGNOSIS — J439 Emphysema, unspecified: Secondary | ICD-10-CM | POA: Diagnosis not present

## 2023-01-10 DIAGNOSIS — E1122 Type 2 diabetes mellitus with diabetic chronic kidney disease: Secondary | ICD-10-CM | POA: Diagnosis not present

## 2023-01-10 DIAGNOSIS — C3491 Malignant neoplasm of unspecified part of right bronchus or lung: Secondary | ICD-10-CM | POA: Diagnosis not present

## 2023-01-10 DIAGNOSIS — C61 Malignant neoplasm of prostate: Secondary | ICD-10-CM | POA: Diagnosis not present

## 2023-01-10 DIAGNOSIS — C3412 Malignant neoplasm of upper lobe, left bronchus or lung: Secondary | ICD-10-CM | POA: Diagnosis not present

## 2023-01-10 DIAGNOSIS — I4891 Unspecified atrial fibrillation: Secondary | ICD-10-CM | POA: Diagnosis not present

## 2023-01-10 DIAGNOSIS — M898X5 Other specified disorders of bone, thigh: Secondary | ICD-10-CM | POA: Diagnosis not present

## 2023-01-10 DIAGNOSIS — L89896 Pressure-induced deep tissue damage of other site: Secondary | ICD-10-CM | POA: Diagnosis not present

## 2023-01-10 MED ORDER — SILDENAFIL CITRATE 100 MG PO TABS: 100.0000 mg | ORAL_TABLET | Freq: Every day | ORAL | 3 refills | Status: DC | PRN

## 2023-01-10 NOTE — Telephone Encounter (Signed)
Patient scheduled with MD next week for 6 month follow up  Patient called in office today to request viagra rx  Requested to be sent to [facility] in [name]

## 2023-01-10 NOTE — Telephone Encounter (Signed)
Per Dr. Annabell Howells He could have sildenafil 100mg  po qday prn #30 with refills.    Prescription sent to pharmacy.

## 2023-01-11 ENCOUNTER — Encounter: Payer: Self-pay | Admitting: Nurse Practitioner

## 2023-01-11 ENCOUNTER — Ambulatory Visit: Payer: Medicare PPO | Attending: Nurse Practitioner | Admitting: Nurse Practitioner

## 2023-01-11 VITALS — BP 100/62 | HR 93 | Ht >= 80 in | Wt 225.0 lb

## 2023-01-11 DIAGNOSIS — R0602 Shortness of breath: Secondary | ICD-10-CM

## 2023-01-11 DIAGNOSIS — J449 Chronic obstructive pulmonary disease, unspecified: Secondary | ICD-10-CM

## 2023-01-11 DIAGNOSIS — I714 Abdominal aortic aneurysm, without rupture, unspecified: Secondary | ICD-10-CM | POA: Diagnosis not present

## 2023-01-11 DIAGNOSIS — Z79899 Other long term (current) drug therapy: Secondary | ICD-10-CM

## 2023-01-11 DIAGNOSIS — J439 Emphysema, unspecified: Secondary | ICD-10-CM | POA: Diagnosis not present

## 2023-01-11 DIAGNOSIS — R0609 Other forms of dyspnea: Secondary | ICD-10-CM | POA: Diagnosis not present

## 2023-01-11 DIAGNOSIS — C3412 Malignant neoplasm of upper lobe, left bronchus or lung: Secondary | ICD-10-CM | POA: Diagnosis not present

## 2023-01-11 DIAGNOSIS — M898X5 Other specified disorders of bone, thigh: Secondary | ICD-10-CM | POA: Diagnosis not present

## 2023-01-11 DIAGNOSIS — Z515 Encounter for palliative care: Secondary | ICD-10-CM

## 2023-01-11 DIAGNOSIS — E1143 Type 2 diabetes mellitus with diabetic autonomic (poly)neuropathy: Secondary | ICD-10-CM | POA: Diagnosis not present

## 2023-01-11 DIAGNOSIS — I2510 Atherosclerotic heart disease of native coronary artery without angina pectoris: Secondary | ICD-10-CM

## 2023-01-11 DIAGNOSIS — R634 Abnormal weight loss: Secondary | ICD-10-CM

## 2023-01-11 DIAGNOSIS — Z87891 Personal history of nicotine dependence: Secondary | ICD-10-CM

## 2023-01-11 DIAGNOSIS — C61 Malignant neoplasm of prostate: Secondary | ICD-10-CM | POA: Diagnosis not present

## 2023-01-11 DIAGNOSIS — I10 Essential (primary) hypertension: Secondary | ICD-10-CM | POA: Diagnosis not present

## 2023-01-11 DIAGNOSIS — E1122 Type 2 diabetes mellitus with diabetic chronic kidney disease: Secondary | ICD-10-CM | POA: Diagnosis not present

## 2023-01-11 DIAGNOSIS — I4891 Unspecified atrial fibrillation: Secondary | ICD-10-CM

## 2023-01-11 DIAGNOSIS — C3491 Malignant neoplasm of unspecified part of right bronchus or lung: Secondary | ICD-10-CM | POA: Diagnosis not present

## 2023-01-11 DIAGNOSIS — L89896 Pressure-induced deep tissue damage of other site: Secondary | ICD-10-CM | POA: Diagnosis not present

## 2023-01-11 DIAGNOSIS — R531 Weakness: Secondary | ICD-10-CM

## 2023-01-11 NOTE — Progress Notes (Signed)
Cardiology Office Note:  .   Date:  01/11/2023  ID:  Jonathan [name], DOB [birth year], MRN [id number] PCP: Lianne Moris, PA-C  [facility] Providers Cardiologist:  Dina Rich, MD    History of Present Illness: .   Jonathan [name] is a 82 y.o. male with a PMH of coronary atherosclerosis, HTN, HLD, T2DM, former smoker, SHOB/DOE, hx of chest pain, AAA, and hx of COPD and lung cancer, s/p right lung lobectomy RT pneumonitis, and Agent Orange exposure, who presents today for scheduled follow-up.   Last seen by Dr. Dina Rich on August 31, 2022. Patient endorsed shortness of breath/DOE and chest pain. Due to multiple CAD risk factors, Lexiscan was arranged, low risk and reassuring results.   Last seen for follow-up on October 09, 2022.  Stated dyspnea on exertion was getting worse and more progressive. Denied any chest pain, palpitations, syncope, presyncope, dizziness, orthopnea, PND, swelling or significant weight changes, acute bleeding, or claudication.  Was referred to pulmonology for further evaluation.  Seen by Dr. Vassie Loll in July 2024 who stated there was no clear reason for his shortness of breath, was given a sample of Spiriva.   Since I last saw patient in July 2024 he has been admitted to [facility] in August 2024 for pneumonia.  In September 2024 he presented with abdominal pain at [facility], said it was chronic from hernia.  CT imaging was positive for new lytic lesions, no abdominal pathology.  Was recommended to follow-up outpatient for ultrasound as needed.  Admitted in October 2024 for rapid A-fib, was noted he had an episode of rapid A-fib following right upper lobe lobectomy.  CT scan of abdomen/pelvis and demonstrated 3.9 cm AAA and lucent bone lesions.  Treated with potassium chloride, Pepcid, acetaminophen, given Lopressor, started on Eliquis.  Urology was consulted recommended getting a biopsy, transition off of Eliquis and onto heparin drip.  IR evaluated  and patient underwent CT-guided right iliac bone biopsy.  Converted to normal sinus rhythm.  PT/OT recommended home health.  Echocardiogram revealed EF 55%, mildly calcified aortic valve, no significant valvular abnormalities.  Today he presents for follow-up.  Says he has not been doing well.  Admits to weakness.  Admits to diagnosis of cancer and will be seeing oncology for possible chemo/radiation.  Says he has lost a lot of weight, admits to poor appetite and has been taking Ensure supplements for this.  Continues to admit to stable/chronic DOE.  Says he attempted pulmonary rehab at [facility], quit on his own a couple months ago, says he could not tolerate this.  Wears 1 L of oxygen via nasal cannula as needed. Denies any chest pain, palpitations, syncope, presyncope, dizziness, orthopnea, PND, swelling, acute bleeding, or claudication.  PT/OT are helping out with his care.   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    Echocardiogram 12/2022: 1. Unable to r/o small RWMA poor endocardial definition even with  definity     . Left ventricular ejection fraction, by estimation, is 55%. The left  ventricle has normal function. Left ventricular endocardial border not  optimally defined to evaluate regional wall motion. Left ventricular  diastolic parameters were normal.   2. Right ventricular systolic function is normal. The right ventricular  size is normal.   3. Left atrial size was mildly dilated.   4. Right atrial size was mildly dilated.   5. The mitral valve was not well  visualized. No evidence of mitral valve  regurgitation.   6. The aortic valve is tricuspid. There is mild calcification of the  aortic valve. There is mild thickening of the aortic valve. Aortic valve  regurgitation is not visualized. Aortic valve sclerosis is present, with  no evidence of aortic valve stenosis.  Lexiscan 08/2022:   The study is low risk.   No ST  deviation was noted.   There is large moderate intensity inferior defect with mild reversibility. The defect has relatively preserved wall motion. There is significant adjacent gut radiotracer uptake that likely affects findings. Inferior infact with mild peri-infarct ischemia vs gut related artifact, either finding would support low risk   Left ventricular function is normal. Nuclear stress EF: 63 %. The left ventricular ejection fraction is normal (55-65%). End diastolic cavity size is normal.  Echo 03/2022: 1. Left ventricular ejection fraction, by estimation, is 55 to 60%. The  left ventricle has normal function. The left ventricle has no regional  wall motion abnormalities. Left ventricular diastolic parameters are  consistent with Grade I diastolic  dysfunction (impaired relaxation).   2. Right ventricular systolic function is normal. The right ventricular  size is normal. Tricuspid regurgitation signal is inadequate for assessing  PA pressure.   3. The mitral valve is normal in structure. Trivial mitral valve  regurgitation. No evidence of mitral stenosis.   4. The aortic valve has an indeterminant number of cusps. There is mild  calcification of the aortic valve. There is mild thickening of the aortic  valve. Aortic valve regurgitation is not visualized. No aortic stenosis is  present.   5. Aortic dilatation noted. There is mild dilatation of the aortic root,  measuring 41 mm.   6. The inferior vena cava is normal in size with greater than 50%  respiratory variability, suggesting right atrial pressure of 3 mmHg.   Comparison(s): Echocardiogram done 11/17/12 showed an EF of 55-60%.  Physical Exam:   VS:  BP 100/62   Pulse 93   Ht 6\' 8"  (2.032 m)   Wt 225 lb (102.1 kg)   SpO2 99%   BMI 24.72 kg/m    Wt Readings from Last 3 Encounters:  01/11/23 225 lb (102.1 kg)  12/30/22 216 lb 4.3 oz (98.1 kg)  10/10/22 237 lb (107.5 kg)    GEN: Thin, 82 year old male in no acute  distress,  appears weak/fatigued NECK: No JVD; No carotid bruits CARDIAC: S1/S2, RRR, no murmurs, rubs, gallops RESPIRATORY:  Clear to auscultation without rales, wheezing or rhonchi  EXTREMITIES:  No edema; No deformity   ASSESSMENT AND PLAN: .    Coronary atherosclerosis, dyspnea on exertion/SHOB Denies any chest pain. Does admit to stable DOE. Past NST was benign without evidence of severe or significant blockages. TTE 12/2022 showed normal EF. No indication for ischemic valuation at this time. Continue current medication regimen. ED precautions discussed.   2. A-fib, medication mangement Denies any sense of tachycardia or palpitations.  Heart rate is well-controlled today.  No medication changes at this time.  He is tolerating Eliquis well.  Continue Eliquis for stroke prevention.  Will obtain the following lab work per request of hospitalist: CBC, CMP, magnesium, and phosphorus.  HTN BP stable. Discussed to monitor BP at home at least 2 hours after medications and sitting for 5-10 minutes. No medication changes at this time. Heart healthy diet encouraged.   AAA Recent CT imaging revealed 3.9 cm diameter of AAA, was recommended to follow-up via ultrasound every 3  years. Repeat abdominal ultrasound will be due in in 2027. Care and ED precautions discussed. No medication changes at this time. Heart healthy diet encouraged.   COPD, lung cancer, s/p right lung lobectomy, former smoker Does admit to stable DOE as mentioned above. Pt states he was previously exposed to [employer]. Recent cardiac workup reassuring.  Continue follow-up with pulmonology.3  5. Weakness, weight loss, palliative care by specialist Does admit to recent cancer diagnosis, will be following up with oncology.  Admits to weakness/weight loss.  Discussed/reviewed goals of care.  He is requesting care at home and is agreeable to palliative care referral.  Continue to work with home health PT/OT.  Will refer to palliative  care.  Dispo: Follow-up with me or APP via telephone visit 2-3 months or sooner if anything changes.   Signed, Sharlene Dory, NP

## 2023-01-11 NOTE — Patient Instructions (Addendum)
Medication Instructions:  Your physician recommends that you continue on your current medications as directed. Please refer to the Current Medication list given to you today.  Labwork: CBC, MAG, Phos in 1 [facility] at [facility]   Testing/Procedures: None   Follow-Up: Your physician recommends that you schedule a follow-up appointment in: Telephone visit in 2-3 months   Any Other Special Instructions Will Be Listed Below (If Applicable).  If you need a refill on your cardiac medications before your next appointment, please call your pharmacy.

## 2023-01-14 ENCOUNTER — Other Ambulatory Visit: Payer: TRICARE For Life (TFL)

## 2023-01-15 DIAGNOSIS — G609 Hereditary and idiopathic neuropathy, unspecified: Secondary | ICD-10-CM | POA: Diagnosis not present

## 2023-01-15 DIAGNOSIS — N319 Neuromuscular dysfunction of bladder, unspecified: Secondary | ICD-10-CM | POA: Diagnosis not present

## 2023-01-15 DIAGNOSIS — M899 Disorder of bone, unspecified: Secondary | ICD-10-CM | POA: Diagnosis not present

## 2023-01-15 DIAGNOSIS — Z85118 Personal history of other malignant neoplasm of bronchus and lung: Secondary | ICD-10-CM | POA: Diagnosis not present

## 2023-01-15 DIAGNOSIS — E1142 Type 2 diabetes mellitus with diabetic polyneuropathy: Secondary | ICD-10-CM | POA: Diagnosis not present

## 2023-01-15 DIAGNOSIS — C3411 Malignant neoplasm of upper lobe, right bronchus or lung: Secondary | ICD-10-CM | POA: Diagnosis not present

## 2023-01-15 DIAGNOSIS — E1122 Type 2 diabetes mellitus with diabetic chronic kidney disease: Secondary | ICD-10-CM | POA: Diagnosis not present

## 2023-01-15 DIAGNOSIS — Z8546 Personal history of malignant neoplasm of prostate: Secondary | ICD-10-CM | POA: Diagnosis not present

## 2023-01-15 DIAGNOSIS — Z794 Long term (current) use of insulin: Secondary | ICD-10-CM | POA: Diagnosis not present

## 2023-01-15 DIAGNOSIS — C61 Malignant neoplasm of prostate: Secondary | ICD-10-CM | POA: Diagnosis not present

## 2023-01-15 DIAGNOSIS — C3412 Malignant neoplasm of upper lobe, left bronchus or lung: Secondary | ICD-10-CM | POA: Diagnosis not present

## 2023-01-15 DIAGNOSIS — C8190 Hodgkin lymphoma, unspecified, unspecified site: Secondary | ICD-10-CM | POA: Diagnosis not present

## 2023-01-15 DIAGNOSIS — J849 Interstitial pulmonary disease, unspecified: Secondary | ICD-10-CM | POA: Diagnosis not present

## 2023-01-16 DIAGNOSIS — L89896 Pressure-induced deep tissue damage of other site: Secondary | ICD-10-CM | POA: Diagnosis not present

## 2023-01-16 DIAGNOSIS — E1143 Type 2 diabetes mellitus with diabetic autonomic (poly)neuropathy: Secondary | ICD-10-CM | POA: Diagnosis not present

## 2023-01-16 DIAGNOSIS — I4891 Unspecified atrial fibrillation: Secondary | ICD-10-CM | POA: Diagnosis not present

## 2023-01-16 DIAGNOSIS — C3491 Malignant neoplasm of unspecified part of right bronchus or lung: Secondary | ICD-10-CM | POA: Diagnosis not present

## 2023-01-16 DIAGNOSIS — J439 Emphysema, unspecified: Secondary | ICD-10-CM | POA: Diagnosis not present

## 2023-01-16 DIAGNOSIS — M898X5 Other specified disorders of bone, thigh: Secondary | ICD-10-CM | POA: Diagnosis not present

## 2023-01-16 DIAGNOSIS — C61 Malignant neoplasm of prostate: Secondary | ICD-10-CM | POA: Diagnosis not present

## 2023-01-16 DIAGNOSIS — E1122 Type 2 diabetes mellitus with diabetic chronic kidney disease: Secondary | ICD-10-CM | POA: Diagnosis not present

## 2023-01-16 DIAGNOSIS — C3412 Malignant neoplasm of upper lobe, left bronchus or lung: Secondary | ICD-10-CM | POA: Diagnosis not present

## 2023-01-17 ENCOUNTER — Ambulatory Visit: Payer: TRICARE For Life (TFL) | Admitting: Urology

## 2023-01-17 ENCOUNTER — Ambulatory Visit: Payer: Medicare PPO | Admitting: Urology

## 2023-01-17 DIAGNOSIS — J439 Emphysema, unspecified: Secondary | ICD-10-CM | POA: Diagnosis not present

## 2023-01-17 DIAGNOSIS — M898X5 Other specified disorders of bone, thigh: Secondary | ICD-10-CM | POA: Diagnosis not present

## 2023-01-17 DIAGNOSIS — C3412 Malignant neoplasm of upper lobe, left bronchus or lung: Secondary | ICD-10-CM | POA: Diagnosis not present

## 2023-01-17 DIAGNOSIS — C3491 Malignant neoplasm of unspecified part of right bronchus or lung: Secondary | ICD-10-CM | POA: Diagnosis not present

## 2023-01-17 DIAGNOSIS — C61 Malignant neoplasm of prostate: Secondary | ICD-10-CM | POA: Diagnosis not present

## 2023-01-17 DIAGNOSIS — I4891 Unspecified atrial fibrillation: Secondary | ICD-10-CM | POA: Diagnosis not present

## 2023-01-17 DIAGNOSIS — E1122 Type 2 diabetes mellitus with diabetic chronic kidney disease: Secondary | ICD-10-CM | POA: Diagnosis not present

## 2023-01-17 DIAGNOSIS — L89896 Pressure-induced deep tissue damage of other site: Secondary | ICD-10-CM | POA: Diagnosis not present

## 2023-01-17 DIAGNOSIS — E1143 Type 2 diabetes mellitus with diabetic autonomic (poly)neuropathy: Secondary | ICD-10-CM | POA: Diagnosis not present

## 2023-01-18 DIAGNOSIS — R300 Dysuria: Secondary | ICD-10-CM | POA: Diagnosis not present

## 2023-01-23 DIAGNOSIS — E1122 Type 2 diabetes mellitus with diabetic chronic kidney disease: Secondary | ICD-10-CM | POA: Diagnosis not present

## 2023-01-23 DIAGNOSIS — J439 Emphysema, unspecified: Secondary | ICD-10-CM | POA: Diagnosis not present

## 2023-01-23 DIAGNOSIS — I4891 Unspecified atrial fibrillation: Secondary | ICD-10-CM | POA: Diagnosis not present

## 2023-01-23 DIAGNOSIS — C61 Malignant neoplasm of prostate: Secondary | ICD-10-CM | POA: Diagnosis not present

## 2023-01-23 DIAGNOSIS — C3412 Malignant neoplasm of upper lobe, left bronchus or lung: Secondary | ICD-10-CM | POA: Diagnosis not present

## 2023-01-23 DIAGNOSIS — E1143 Type 2 diabetes mellitus with diabetic autonomic (poly)neuropathy: Secondary | ICD-10-CM | POA: Diagnosis not present

## 2023-01-23 DIAGNOSIS — C3491 Malignant neoplasm of unspecified part of right bronchus or lung: Secondary | ICD-10-CM | POA: Diagnosis not present

## 2023-01-23 DIAGNOSIS — M898X5 Other specified disorders of bone, thigh: Secondary | ICD-10-CM | POA: Diagnosis not present

## 2023-01-23 DIAGNOSIS — L89896 Pressure-induced deep tissue damage of other site: Secondary | ICD-10-CM | POA: Diagnosis not present

## 2023-01-24 ENCOUNTER — Ambulatory Visit: Payer: Medicare PPO | Admitting: Urology

## 2023-01-24 ENCOUNTER — Encounter: Payer: Self-pay | Admitting: Urology

## 2023-01-24 VITALS — BP 100/66 | HR 92 | Ht >= 80 in | Wt 225.0 lb

## 2023-01-24 DIAGNOSIS — N5201 Erectile dysfunction due to arterial insufficiency: Secondary | ICD-10-CM

## 2023-01-24 DIAGNOSIS — R32 Unspecified urinary incontinence: Secondary | ICD-10-CM

## 2023-01-24 DIAGNOSIS — N3941 Urge incontinence: Secondary | ICD-10-CM

## 2023-01-24 DIAGNOSIS — N390 Urinary tract infection, site not specified: Secondary | ICD-10-CM

## 2023-01-24 DIAGNOSIS — N3281 Overactive bladder: Secondary | ICD-10-CM | POA: Diagnosis not present

## 2023-01-24 DIAGNOSIS — R9721 Rising PSA following treatment for malignant neoplasm of prostate: Secondary | ICD-10-CM

## 2023-01-24 DIAGNOSIS — Z87440 Personal history of urinary (tract) infections: Secondary | ICD-10-CM

## 2023-01-24 DIAGNOSIS — R1084 Generalized abdominal pain: Secondary | ICD-10-CM | POA: Diagnosis not present

## 2023-01-24 DIAGNOSIS — Z8546 Personal history of malignant neoplasm of prostate: Secondary | ICD-10-CM

## 2023-01-24 DIAGNOSIS — J449 Chronic obstructive pulmonary disease, unspecified: Secondary | ICD-10-CM | POA: Diagnosis not present

## 2023-01-24 DIAGNOSIS — N393 Stress incontinence (female) (male): Secondary | ICD-10-CM

## 2023-01-24 MED ORDER — CVS MENS GUARD MISC: 1.0000 | Freq: Two times a day (BID) | 11 refills | Status: DC

## 2023-01-24 MED ORDER — SILDENAFIL CITRATE 100 MG PO TABS: 100.0000 mg | ORAL_TABLET | Freq: Every day | ORAL | 3 refills | Status: DC | PRN

## 2023-01-24 MED ORDER — TAMSULOSIN HCL 0.4 MG PO CAPS: 0.4000 mg | ORAL_CAPSULE | Freq: Every day | ORAL | 3 refills | Status: DC

## 2023-01-24 MED ORDER — DEPEND REAL FIT/BRIEF/MEN/L-XL MISC: 1.0000 | Freq: Two times a day (BID) | 11 refills | Status: DC

## 2023-01-25 DIAGNOSIS — C3491 Malignant neoplasm of unspecified part of right bronchus or lung: Secondary | ICD-10-CM | POA: Diagnosis not present

## 2023-01-25 DIAGNOSIS — C61 Malignant neoplasm of prostate: Secondary | ICD-10-CM | POA: Diagnosis not present

## 2023-01-25 DIAGNOSIS — M898X5 Other specified disorders of bone, thigh: Secondary | ICD-10-CM | POA: Diagnosis not present

## 2023-01-25 DIAGNOSIS — I4891 Unspecified atrial fibrillation: Secondary | ICD-10-CM | POA: Diagnosis not present

## 2023-01-25 DIAGNOSIS — L89896 Pressure-induced deep tissue damage of other site: Secondary | ICD-10-CM | POA: Diagnosis not present

## 2023-01-25 DIAGNOSIS — E1143 Type 2 diabetes mellitus with diabetic autonomic (poly)neuropathy: Secondary | ICD-10-CM | POA: Diagnosis not present

## 2023-01-25 DIAGNOSIS — J439 Emphysema, unspecified: Secondary | ICD-10-CM | POA: Diagnosis not present

## 2023-01-25 DIAGNOSIS — C3412 Malignant neoplasm of upper lobe, left bronchus or lung: Secondary | ICD-10-CM | POA: Diagnosis not present

## 2023-01-25 DIAGNOSIS — E1122 Type 2 diabetes mellitus with diabetic chronic kidney disease: Secondary | ICD-10-CM | POA: Diagnosis not present

## 2023-01-28 DIAGNOSIS — M899 Disorder of bone, unspecified: Secondary | ICD-10-CM | POA: Diagnosis not present

## 2023-01-28 DIAGNOSIS — I700 Atherosclerosis of aorta: Secondary | ICD-10-CM | POA: Diagnosis not present

## 2023-01-28 DIAGNOSIS — C3411 Malignant neoplasm of upper lobe, right bronchus or lung: Secondary | ICD-10-CM | POA: Diagnosis not present

## 2023-01-28 DIAGNOSIS — C61 Malignant neoplasm of prostate: Secondary | ICD-10-CM | POA: Diagnosis not present

## 2023-01-28 DIAGNOSIS — C8590 Non-Hodgkin lymphoma, unspecified, unspecified site: Secondary | ICD-10-CM | POA: Diagnosis not present

## 2023-01-28 DIAGNOSIS — R930 Abnormal findings on diagnostic imaging of skull and head, not elsewhere classified: Secondary | ICD-10-CM | POA: Diagnosis not present

## 2023-01-28 DIAGNOSIS — J439 Emphysema, unspecified: Secondary | ICD-10-CM | POA: Diagnosis not present

## 2023-01-29 ENCOUNTER — Ambulatory Visit: Payer: Medicare PPO

## 2023-01-29 DIAGNOSIS — R1011 Right upper quadrant pain: Secondary | ICD-10-CM | POA: Diagnosis not present

## 2023-01-29 DIAGNOSIS — R110 Nausea: Secondary | ICD-10-CM | POA: Diagnosis not present

## 2023-01-29 DIAGNOSIS — N189 Chronic kidney disease, unspecified: Secondary | ICD-10-CM | POA: Diagnosis not present

## 2023-01-29 DIAGNOSIS — Z85118 Personal history of other malignant neoplasm of bronchus and lung: Secondary | ICD-10-CM | POA: Diagnosis not present

## 2023-01-29 DIAGNOSIS — R1084 Generalized abdominal pain: Secondary | ICD-10-CM | POA: Diagnosis not present

## 2023-01-29 DIAGNOSIS — Z884 Allergy status to anesthetic agent status: Secondary | ICD-10-CM | POA: Diagnosis not present

## 2023-01-29 DIAGNOSIS — Z880 Allergy status to penicillin: Secondary | ICD-10-CM | POA: Diagnosis not present

## 2023-01-29 DIAGNOSIS — I129 Hypertensive chronic kidney disease with stage 1 through stage 4 chronic kidney disease, or unspecified chronic kidney disease: Secondary | ICD-10-CM | POA: Diagnosis not present

## 2023-01-29 DIAGNOSIS — Z888 Allergy status to other drugs, medicaments and biological substances status: Secondary | ICD-10-CM | POA: Diagnosis not present

## 2023-01-29 DIAGNOSIS — E1122 Type 2 diabetes mellitus with diabetic chronic kidney disease: Secondary | ICD-10-CM | POA: Diagnosis not present

## 2023-01-29 DIAGNOSIS — K219 Gastro-esophageal reflux disease without esophagitis: Secondary | ICD-10-CM | POA: Diagnosis not present

## 2023-01-29 DIAGNOSIS — I4891 Unspecified atrial fibrillation: Secondary | ICD-10-CM | POA: Diagnosis not present

## 2023-01-29 DIAGNOSIS — Z8546 Personal history of malignant neoplasm of prostate: Secondary | ICD-10-CM | POA: Diagnosis not present

## 2023-01-29 DIAGNOSIS — M898X5 Other specified disorders of bone, thigh: Secondary | ICD-10-CM | POA: Diagnosis not present

## 2023-01-29 DIAGNOSIS — R1013 Epigastric pain: Secondary | ICD-10-CM | POA: Diagnosis not present

## 2023-02-01 DIAGNOSIS — C3412 Malignant neoplasm of upper lobe, left bronchus or lung: Secondary | ICD-10-CM | POA: Diagnosis not present

## 2023-02-01 DIAGNOSIS — L89896 Pressure-induced deep tissue damage of other site: Secondary | ICD-10-CM | POA: Diagnosis not present

## 2023-02-01 DIAGNOSIS — M898X5 Other specified disorders of bone, thigh: Secondary | ICD-10-CM | POA: Diagnosis not present

## 2023-02-01 DIAGNOSIS — I4891 Unspecified atrial fibrillation: Secondary | ICD-10-CM | POA: Diagnosis not present

## 2023-02-01 DIAGNOSIS — C61 Malignant neoplasm of prostate: Secondary | ICD-10-CM | POA: Diagnosis not present

## 2023-02-01 DIAGNOSIS — E1143 Type 2 diabetes mellitus with diabetic autonomic (poly)neuropathy: Secondary | ICD-10-CM | POA: Diagnosis not present

## 2023-02-01 DIAGNOSIS — C3491 Malignant neoplasm of unspecified part of right bronchus or lung: Secondary | ICD-10-CM | POA: Diagnosis not present

## 2023-02-01 DIAGNOSIS — E1122 Type 2 diabetes mellitus with diabetic chronic kidney disease: Secondary | ICD-10-CM | POA: Diagnosis not present

## 2023-02-01 DIAGNOSIS — J439 Emphysema, unspecified: Secondary | ICD-10-CM | POA: Diagnosis not present

## 2023-02-05 DIAGNOSIS — J439 Emphysema, unspecified: Secondary | ICD-10-CM | POA: Diagnosis not present

## 2023-02-05 DIAGNOSIS — Z85118 Personal history of other malignant neoplasm of bronchus and lung: Secondary | ICD-10-CM | POA: Diagnosis not present

## 2023-02-05 DIAGNOSIS — G609 Hereditary and idiopathic neuropathy, unspecified: Secondary | ICD-10-CM | POA: Diagnosis not present

## 2023-02-05 DIAGNOSIS — I4891 Unspecified atrial fibrillation: Secondary | ICD-10-CM | POA: Diagnosis not present

## 2023-02-05 DIAGNOSIS — C8190 Hodgkin lymphoma, unspecified, unspecified site: Secondary | ICD-10-CM | POA: Diagnosis not present

## 2023-02-05 DIAGNOSIS — L89896 Pressure-induced deep tissue damage of other site: Secondary | ICD-10-CM | POA: Diagnosis not present

## 2023-02-05 DIAGNOSIS — C3412 Malignant neoplasm of upper lobe, left bronchus or lung: Secondary | ICD-10-CM | POA: Diagnosis not present

## 2023-02-05 DIAGNOSIS — E1143 Type 2 diabetes mellitus with diabetic autonomic (poly)neuropathy: Secondary | ICD-10-CM | POA: Diagnosis not present

## 2023-02-05 DIAGNOSIS — M898X5 Other specified disorders of bone, thigh: Secondary | ICD-10-CM | POA: Diagnosis not present

## 2023-02-05 DIAGNOSIS — E1122 Type 2 diabetes mellitus with diabetic chronic kidney disease: Secondary | ICD-10-CM | POA: Diagnosis not present

## 2023-02-05 DIAGNOSIS — C9002 Multiple myeloma in relapse: Secondary | ICD-10-CM | POA: Diagnosis not present

## 2023-02-05 DIAGNOSIS — C61 Malignant neoplasm of prostate: Secondary | ICD-10-CM | POA: Diagnosis not present

## 2023-02-05 DIAGNOSIS — C3491 Malignant neoplasm of unspecified part of right bronchus or lung: Secondary | ICD-10-CM | POA: Diagnosis not present

## 2023-02-05 DIAGNOSIS — C4092 Malignant neoplasm of unspecified bones and articular cartilage of left limb: Secondary | ICD-10-CM | POA: Diagnosis not present

## 2023-02-05 DIAGNOSIS — M899 Disorder of bone, unspecified: Secondary | ICD-10-CM | POA: Diagnosis not present

## 2023-02-05 DIAGNOSIS — Z8546 Personal history of malignant neoplasm of prostate: Secondary | ICD-10-CM | POA: Diagnosis not present

## 2023-02-05 DIAGNOSIS — C3411 Malignant neoplasm of upper lobe, right bronchus or lung: Secondary | ICD-10-CM | POA: Diagnosis not present

## 2023-02-05 DIAGNOSIS — D472 Monoclonal gammopathy: Secondary | ICD-10-CM | POA: Diagnosis not present

## 2023-02-06 DIAGNOSIS — R030 Elevated blood-pressure reading, without diagnosis of hypertension: Secondary | ICD-10-CM | POA: Diagnosis not present

## 2023-02-06 DIAGNOSIS — R531 Weakness: Secondary | ICD-10-CM | POA: Diagnosis not present

## 2023-02-06 DIAGNOSIS — R2689 Other abnormalities of gait and mobility: Secondary | ICD-10-CM | POA: Diagnosis not present

## 2023-02-06 DIAGNOSIS — Z8546 Personal history of malignant neoplasm of prostate: Secondary | ICD-10-CM | POA: Diagnosis not present

## 2023-02-06 DIAGNOSIS — I4891 Unspecified atrial fibrillation: Secondary | ICD-10-CM | POA: Diagnosis not present

## 2023-02-06 DIAGNOSIS — C9000 Multiple myeloma not having achieved remission: Secondary | ICD-10-CM | POA: Diagnosis not present

## 2023-02-06 DIAGNOSIS — M792 Neuralgia and neuritis, unspecified: Secondary | ICD-10-CM | POA: Diagnosis not present

## 2023-02-06 DIAGNOSIS — J701 Chronic and other pulmonary manifestations due to radiation: Secondary | ICD-10-CM | POA: Diagnosis not present

## 2023-02-09 DIAGNOSIS — J189 Pneumonia, unspecified organism: Secondary | ICD-10-CM | POA: Diagnosis not present

## 2023-02-12 DIAGNOSIS — C61 Malignant neoplasm of prostate: Secondary | ICD-10-CM | POA: Diagnosis not present

## 2023-02-12 DIAGNOSIS — M898X5 Other specified disorders of bone, thigh: Secondary | ICD-10-CM | POA: Diagnosis not present

## 2023-02-12 DIAGNOSIS — C3412 Malignant neoplasm of upper lobe, left bronchus or lung: Secondary | ICD-10-CM | POA: Diagnosis not present

## 2023-02-12 DIAGNOSIS — L89896 Pressure-induced deep tissue damage of other site: Secondary | ICD-10-CM | POA: Diagnosis not present

## 2023-02-12 DIAGNOSIS — E1143 Type 2 diabetes mellitus with diabetic autonomic (poly)neuropathy: Secondary | ICD-10-CM | POA: Diagnosis not present

## 2023-02-12 DIAGNOSIS — I4891 Unspecified atrial fibrillation: Secondary | ICD-10-CM | POA: Diagnosis not present

## 2023-02-12 DIAGNOSIS — C3491 Malignant neoplasm of unspecified part of right bronchus or lung: Secondary | ICD-10-CM | POA: Diagnosis not present

## 2023-02-12 DIAGNOSIS — J439 Emphysema, unspecified: Secondary | ICD-10-CM | POA: Diagnosis not present

## 2023-02-12 DIAGNOSIS — E1122 Type 2 diabetes mellitus with diabetic chronic kidney disease: Secondary | ICD-10-CM | POA: Diagnosis not present

## 2023-02-13 DIAGNOSIS — Z7901 Long term (current) use of anticoagulants: Secondary | ICD-10-CM | POA: Diagnosis not present

## 2023-02-13 DIAGNOSIS — D72822 Plasmacytosis: Secondary | ICD-10-CM | POA: Diagnosis not present

## 2023-02-13 DIAGNOSIS — Z8546 Personal history of malignant neoplasm of prostate: Secondary | ICD-10-CM | POA: Diagnosis not present

## 2023-02-13 DIAGNOSIS — Z85118 Personal history of other malignant neoplasm of bronchus and lung: Secondary | ICD-10-CM | POA: Diagnosis not present

## 2023-02-13 DIAGNOSIS — M899 Disorder of bone, unspecified: Secondary | ICD-10-CM | POA: Diagnosis not present

## 2023-02-13 DIAGNOSIS — Z7982 Long term (current) use of aspirin: Secondary | ICD-10-CM | POA: Diagnosis not present

## 2023-02-13 DIAGNOSIS — Z79899 Other long term (current) drug therapy: Secondary | ICD-10-CM | POA: Diagnosis not present

## 2023-02-14 ENCOUNTER — Telehealth: Payer: Self-pay | Admitting: Nurse Practitioner

## 2023-02-14 DIAGNOSIS — E1143 Type 2 diabetes mellitus with diabetic autonomic (poly)neuropathy: Secondary | ICD-10-CM | POA: Diagnosis not present

## 2023-02-14 DIAGNOSIS — C3412 Malignant neoplasm of upper lobe, left bronchus or lung: Secondary | ICD-10-CM | POA: Diagnosis not present

## 2023-02-14 DIAGNOSIS — L89896 Pressure-induced deep tissue damage of other site: Secondary | ICD-10-CM | POA: Diagnosis not present

## 2023-02-14 DIAGNOSIS — E1122 Type 2 diabetes mellitus with diabetic chronic kidney disease: Secondary | ICD-10-CM | POA: Diagnosis not present

## 2023-02-14 DIAGNOSIS — I4891 Unspecified atrial fibrillation: Secondary | ICD-10-CM | POA: Diagnosis not present

## 2023-02-14 DIAGNOSIS — C3491 Malignant neoplasm of unspecified part of right bronchus or lung: Secondary | ICD-10-CM | POA: Diagnosis not present

## 2023-02-14 DIAGNOSIS — C61 Malignant neoplasm of prostate: Secondary | ICD-10-CM | POA: Diagnosis not present

## 2023-02-14 DIAGNOSIS — M898X5 Other specified disorders of bone, thigh: Secondary | ICD-10-CM | POA: Diagnosis not present

## 2023-02-14 DIAGNOSIS — J439 Emphysema, unspecified: Secondary | ICD-10-CM | POA: Diagnosis not present

## 2023-02-14 NOTE — Telephone Encounter (Signed)
Office calling to make NP aware that pt declined referral and services. Please advise

## 2023-02-18 DIAGNOSIS — C61 Malignant neoplasm of prostate: Secondary | ICD-10-CM | POA: Diagnosis not present

## 2023-02-18 DIAGNOSIS — C3491 Malignant neoplasm of unspecified part of right bronchus or lung: Secondary | ICD-10-CM | POA: Diagnosis not present

## 2023-02-18 DIAGNOSIS — C3412 Malignant neoplasm of upper lobe, left bronchus or lung: Secondary | ICD-10-CM | POA: Diagnosis not present

## 2023-02-18 DIAGNOSIS — E1122 Type 2 diabetes mellitus with diabetic chronic kidney disease: Secondary | ICD-10-CM | POA: Diagnosis not present

## 2023-02-18 DIAGNOSIS — L89896 Pressure-induced deep tissue damage of other site: Secondary | ICD-10-CM | POA: Diagnosis not present

## 2023-02-18 DIAGNOSIS — M898X5 Other specified disorders of bone, thigh: Secondary | ICD-10-CM | POA: Diagnosis not present

## 2023-02-18 DIAGNOSIS — I4891 Unspecified atrial fibrillation: Secondary | ICD-10-CM | POA: Diagnosis not present

## 2023-02-18 DIAGNOSIS — J439 Emphysema, unspecified: Secondary | ICD-10-CM | POA: Diagnosis not present

## 2023-02-18 DIAGNOSIS — E1143 Type 2 diabetes mellitus with diabetic autonomic (poly)neuropathy: Secondary | ICD-10-CM | POA: Diagnosis not present

## 2023-02-19 DIAGNOSIS — Z87891 Personal history of nicotine dependence: Secondary | ICD-10-CM | POA: Diagnosis not present

## 2023-02-19 DIAGNOSIS — G8929 Other chronic pain: Secondary | ICD-10-CM | POA: Diagnosis not present

## 2023-02-19 DIAGNOSIS — Z8546 Personal history of malignant neoplasm of prostate: Secondary | ICD-10-CM | POA: Diagnosis not present

## 2023-02-19 DIAGNOSIS — E1122 Type 2 diabetes mellitus with diabetic chronic kidney disease: Secondary | ICD-10-CM | POA: Diagnosis not present

## 2023-02-19 DIAGNOSIS — Z87440 Personal history of urinary (tract) infections: Secondary | ICD-10-CM | POA: Diagnosis not present

## 2023-02-19 DIAGNOSIS — I129 Hypertensive chronic kidney disease with stage 1 through stage 4 chronic kidney disease, or unspecified chronic kidney disease: Secondary | ICD-10-CM | POA: Diagnosis not present

## 2023-02-19 DIAGNOSIS — R0602 Shortness of breath: Secondary | ICD-10-CM | POA: Diagnosis not present

## 2023-02-19 DIAGNOSIS — Z85118 Personal history of other malignant neoplasm of bronchus and lung: Secondary | ICD-10-CM | POA: Diagnosis not present

## 2023-02-19 DIAGNOSIS — R531 Weakness: Secondary | ICD-10-CM | POA: Diagnosis not present

## 2023-02-19 DIAGNOSIS — Z20822 Contact with and (suspected) exposure to covid-19: Secondary | ICD-10-CM | POA: Diagnosis not present

## 2023-02-19 DIAGNOSIS — N189 Chronic kidney disease, unspecified: Secondary | ICD-10-CM | POA: Diagnosis not present

## 2023-02-25 DIAGNOSIS — C61 Malignant neoplasm of prostate: Secondary | ICD-10-CM | POA: Diagnosis not present

## 2023-02-25 DIAGNOSIS — J439 Emphysema, unspecified: Secondary | ICD-10-CM | POA: Diagnosis not present

## 2023-02-25 DIAGNOSIS — E1143 Type 2 diabetes mellitus with diabetic autonomic (poly)neuropathy: Secondary | ICD-10-CM | POA: Diagnosis not present

## 2023-02-25 DIAGNOSIS — C3491 Malignant neoplasm of unspecified part of right bronchus or lung: Secondary | ICD-10-CM | POA: Diagnosis not present

## 2023-02-25 DIAGNOSIS — E1122 Type 2 diabetes mellitus with diabetic chronic kidney disease: Secondary | ICD-10-CM | POA: Diagnosis not present

## 2023-02-25 DIAGNOSIS — C3412 Malignant neoplasm of upper lobe, left bronchus or lung: Secondary | ICD-10-CM | POA: Diagnosis not present

## 2023-02-25 DIAGNOSIS — L89896 Pressure-induced deep tissue damage of other site: Secondary | ICD-10-CM | POA: Diagnosis not present

## 2023-02-25 DIAGNOSIS — I4891 Unspecified atrial fibrillation: Secondary | ICD-10-CM | POA: Diagnosis not present

## 2023-02-25 DIAGNOSIS — M898X5 Other specified disorders of bone, thigh: Secondary | ICD-10-CM | POA: Diagnosis not present

## 2023-02-28 ENCOUNTER — Ambulatory Visit: Payer: Medicare PPO

## 2023-02-28 DIAGNOSIS — C9002 Multiple myeloma in relapse: Secondary | ICD-10-CM | POA: Diagnosis not present

## 2023-03-04 DIAGNOSIS — J439 Emphysema, unspecified: Secondary | ICD-10-CM | POA: Diagnosis not present

## 2023-03-04 DIAGNOSIS — M898X5 Other specified disorders of bone, thigh: Secondary | ICD-10-CM | POA: Diagnosis not present

## 2023-03-04 DIAGNOSIS — E1122 Type 2 diabetes mellitus with diabetic chronic kidney disease: Secondary | ICD-10-CM | POA: Diagnosis not present

## 2023-03-04 DIAGNOSIS — C61 Malignant neoplasm of prostate: Secondary | ICD-10-CM | POA: Diagnosis not present

## 2023-03-04 DIAGNOSIS — I4891 Unspecified atrial fibrillation: Secondary | ICD-10-CM | POA: Diagnosis not present

## 2023-03-04 DIAGNOSIS — C3412 Malignant neoplasm of upper lobe, left bronchus or lung: Secondary | ICD-10-CM | POA: Diagnosis not present

## 2023-03-04 DIAGNOSIS — C3491 Malignant neoplasm of unspecified part of right bronchus or lung: Secondary | ICD-10-CM | POA: Diagnosis not present

## 2023-03-04 DIAGNOSIS — E1143 Type 2 diabetes mellitus with diabetic autonomic (poly)neuropathy: Secondary | ICD-10-CM | POA: Diagnosis not present

## 2023-03-04 DIAGNOSIS — L89896 Pressure-induced deep tissue damage of other site: Secondary | ICD-10-CM | POA: Diagnosis not present

## 2023-03-06 DIAGNOSIS — C8590 Non-Hodgkin lymphoma, unspecified, unspecified site: Secondary | ICD-10-CM | POA: Diagnosis not present

## 2023-03-06 DIAGNOSIS — C9000 Multiple myeloma not having achieved remission: Secondary | ICD-10-CM | POA: Diagnosis not present

## 2023-03-06 DIAGNOSIS — C9002 Multiple myeloma in relapse: Secondary | ICD-10-CM | POA: Diagnosis not present

## 2023-03-08 ENCOUNTER — Encounter: Payer: Self-pay | Admitting: Nurse Practitioner

## 2023-03-08 ENCOUNTER — Telehealth: Payer: Self-pay | Admitting: Nurse Practitioner

## 2023-03-08 ENCOUNTER — Ambulatory Visit: Payer: Medicare PPO | Attending: Nurse Practitioner | Admitting: Nurse Practitioner

## 2023-03-08 VITALS — BP 114/70 | HR 80 | Ht >= 80 in | Wt 223.0 lb

## 2023-03-08 DIAGNOSIS — I4891 Unspecified atrial fibrillation: Secondary | ICD-10-CM | POA: Diagnosis not present

## 2023-03-08 DIAGNOSIS — J449 Chronic obstructive pulmonary disease, unspecified: Secondary | ICD-10-CM

## 2023-03-08 DIAGNOSIS — I2510 Atherosclerotic heart disease of native coronary artery without angina pectoris: Secondary | ICD-10-CM

## 2023-03-08 DIAGNOSIS — R0602 Shortness of breath: Secondary | ICD-10-CM | POA: Diagnosis not present

## 2023-03-08 DIAGNOSIS — D649 Anemia, unspecified: Secondary | ICD-10-CM | POA: Diagnosis not present

## 2023-03-08 DIAGNOSIS — R0609 Other forms of dyspnea: Secondary | ICD-10-CM | POA: Diagnosis not present

## 2023-03-08 DIAGNOSIS — I714 Abdominal aortic aneurysm, without rupture, unspecified: Secondary | ICD-10-CM | POA: Diagnosis not present

## 2023-03-08 DIAGNOSIS — I10 Essential (primary) hypertension: Secondary | ICD-10-CM

## 2023-03-08 DIAGNOSIS — Z87891 Personal history of nicotine dependence: Secondary | ICD-10-CM

## 2023-03-08 DIAGNOSIS — C9000 Multiple myeloma not having achieved remission: Secondary | ICD-10-CM | POA: Diagnosis not present

## 2023-03-08 DIAGNOSIS — R531 Weakness: Secondary | ICD-10-CM

## 2023-03-08 DIAGNOSIS — R296 Repeated falls: Secondary | ICD-10-CM

## 2023-03-08 MED ORDER — METOPROLOL TARTRATE 25 MG PO TABS: 25.0000 mg | ORAL_TABLET | Freq: Two times a day (BID) | ORAL | 6 refills | Status: DC

## 2023-03-08 MED ORDER — APIXABAN 5 MG PO TABS: 5.0000 mg | ORAL_TABLET | Freq: Two times a day (BID) | ORAL | 6 refills | Status: DC

## 2023-03-08 NOTE — Progress Notes (Signed)
Virtual Visit via Telephone Note   Because of Oba Trejos Denny's co-morbid illnesses, he is at least at moderate risk for complications without adequate follow up.  This format is felt to be most appropriate for this patient at this time.  The patient did not have access to video technology/had technical difficulties with video requiring transitioning to audio format only (telephone).  All issues noted in this document were discussed and addressed.  No physical exam could be performed with this format.  Please refer to the patient's chart for his consent to telehealth for [facility].    Date:  03/08/2023   ID:  Jonathan [name], DOB 07/04/1940, MRN [id number] The patient was identified using 2 identifiers.  Patient Location: Home Provider Location: Office/Clinic   PCP:  Lianne Moris, PA-C   [facility] Providers Cardiologist:  Dina Rich, MD     Evaluation Performed:  Follow-Up Visit  Chief Complaint:  Here for telephone follow-up.   History of Present Illness:    Jonathan [name] is a 82 y.o. male with a PMH of coronary atherosclerosis, HTN, HLD, T2DM, former smoker, SHOB/DOE, hx of chest pain, AAA, and hx of COPD and lung cancer, s/p right lung lobectomy RT pneumonitis, and Agent Orange exposure, who presents today for scheduled telehealth follow-up.    Last seen by Dr. Dina Rich on August 31, 2022. Patient endorsed shortness of breath/DOE and chest pain. Due to multiple CAD risk factors, Lexiscan was arranged, low risk and reassuring results.    Last seen for follow-up on October 09, 2022.  Stated dyspnea on exertion was getting worse and more progressive. Denied any chest pain, palpitations, syncope, presyncope, dizziness, orthopnea, PND, swelling or significant weight changes, acute bleeding, or claudication.  Was referred to pulmonology for further evaluation.   Seen by Dr. Vassie Loll in July 2024 who stated there was no clear reason for his shortness  of breath, was given a sample of Spiriva.    Since I last saw patient in July 2024 he has been admitted to [facility] in August 2024 for pneumonia.  In September 2024 he presented with abdominal pain at [facility], said it was chronic from hernia.  CT imaging was positive for new lytic lesions, no abdominal pathology.  Was recommended to follow-up outpatient for ultrasound as needed.   Admitted in October 2024 for rapid A-fib, was noted he had an episode of rapid A-fib following right upper lobe lobectomy.  CT scan of abdomen/pelvis and demonstrated 3.9 cm AAA and lucent bone lesions.  Treated with potassium chloride, Pepcid, acetaminophen, given Lopressor, started on Eliquis.  Urology was consulted recommended getting a biopsy, transition off of Eliquis and onto heparin drip.  IR evaluated and patient underwent CT-guided right iliac bone biopsy.  Converted to normal sinus rhythm.  PT/OT recommended home health.  Echocardiogram revealed EF 55%, mildly calcified aortic valve, no significant valvular abnormalities.   I last saw him for follow-up on January 11, 2023.  He said he had not been doing well, admitted to weakness and to cancer diagnosis, was planning to see oncology for possible chemo/radiation.  Had lost a lot of weight, admitted to poor appetite and had been taking Ensure supplements for this.  Continued to admit to stable/chronic DOE. Reported that he attempted pulmonary rehab at [facility], quit on his own a couple months ago, could not tolerate this.  Was wearing 1 L of oxygen via nasal cannula as needed. Denied any chest pain, palpitations, syncope,  presyncope, dizziness, orthopnea, PND, swelling, acute bleeding, or claudication.  PT/OT are helping out with his care.  Palliative care referral was placed, however patient canceled this.  ED visit at [facility] on February 19, 2023 for generalized weakness, was on antibiotics for UTI. Was given IV ABX and IV medications. Later d/c home.   Today  presents for telehealth follow-up visit.  Still doing the same since I last saw him.  He is currently being followed by hematology/oncology at [facility], specializing in multiple myeloma. Denies any chest pain, worsening shortness of breath, palpitations, syncope, presyncope, dizziness, orthopnea, PND, swelling or significant weight changes, acute bleeding, or claudication.  Admits to 4 falls since last office visit, had a fall last week.  Describes all these falls as mechanical, says "my legs give out."  Denies any acute injuries related to his falls. Says his PCP is aware of this.  Currently has assistance with registered nurse to help with his care.     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    All other systems reviewed and are negative.   Prior CV studies:   The following studies were reviewed today:  Echocardiogram 12/2022: 1. Unable to r/o small RWMA poor endocardial definition even with  definity     . Left ventricular ejection fraction, by estimation, is 55%. The left  ventricle has normal function. Left ventricular endocardial border not  optimally defined to evaluate regional wall motion. Left ventricular  diastolic parameters were normal.   2. Right ventricular systolic function is normal. The right ventricular  size is normal.   3. Left atrial size was mildly dilated.   4. Right atrial size was mildly dilated.   5. The mitral valve was not well visualized. No evidence of mitral valve  regurgitation.   6. The aortic valve is tricuspid. There is mild calcification of the  aortic valve. There is mild thickening of the aortic valve. Aortic valve  regurgitation is not visualized. Aortic valve sclerosis is present, with  no evidence of aortic valve stenosis.   Lexiscan 08/2022:   The study is low risk.   No ST deviation was noted.   There is large moderate intensity inferior defect with  mild reversibility. The defect has relatively preserved wall motion. There is significant adjacent gut radiotracer uptake that likely affects findings. Inferior infact with mild peri-infarct ischemia vs gut related artifact, either finding would support low risk   Left ventricular function is normal. Nuclear stress EF: 63 %. The left ventricular ejection fraction is normal (55-65%). End diastolic cavity size is normal.   Echo 03/2022: 1. Left ventricular ejection fraction, by estimation, is 55 to 60%. The  left ventricle has normal function. The left ventricle has no regional  wall motion abnormalities. Left ventricular diastolic parameters are  consistent with Grade I diastolic  dysfunction (impaired relaxation).   2. Right ventricular systolic function is normal. The right ventricular  size is normal. Tricuspid regurgitation signal is inadequate for assessing  PA pressure.   3. The mitral valve is normal in structure. Trivial mitral valve  regurgitation. No evidence of mitral stenosis.   4. The aortic valve has an indeterminant number of cusps. There is mild  calcification of the aortic valve. There is mild thickening of the aortic  valve. Aortic valve regurgitation is not visualized. No aortic stenosis is  present.   5.  Aortic dilatation noted. There is mild dilatation of the aortic root,  measuring 41 mm.   6. The inferior vena cava is normal in size with greater than 50%  respiratory variability, suggesting right atrial pressure of 3 mmHg.   Comparison(s): Echocardiogram done 11/17/12 showed an EF of 55-60%.  Labs/Other Tests and Data Reviewed:    EKG: EKG is ordered today.        Objective:    Vital Signs:  BP 114/70   Pulse 80   Ht 6\' 8"  (2.032 m)   Wt 223 lb (101.2 kg)   SpO2 99%   BMI 24.50 kg/m    Due to nature of today's visit, physical exam was unable to be performed.  ASSESSMENT & PLAN:    Coronary atherosclerosis, dyspnea on exertion/SHOB Denies any chest  pain. Does admit to stable DOE. Past NST was benign without evidence of severe or significant blockages. TTE 12/2022 showed normal EF. No indication for ischemic valuation at this time. Continue current medication regimen. ED precautions discussed.    2. A-fib Denies any sense of tachycardia or palpitations.  Heart rate is well-controlled.  No medication changes at this time.  He is tolerating Eliquis well, denies any bleeding issues.  Continue Eliquis for stroke prevention.     HTN BP stable. Discussed to monitor BP at home at least 2 hours after medications and sitting for 5-10 minutes. No medication changes at this time. Heart healthy diet encouraged.    AAA Past CT imaging revealed 3.9 cm diameter of AAA, was recommended to follow-up via ultrasound every 3 years. Repeat abdominal ultrasound will be due in in 2027. Care and ED precautions discussed. No medication changes at this time. Heart healthy diet encouraged.    COPD, lung cancer, s/p right lung lobectomy, former smoker Does admit to stable DOE as mentioned above. Pt states he was previously exposed to [employer]. Past cardiac workup reassuring.  Continue follow-up with pulmonology.  4. Anemia Recent labs obtained 2 days ago revealed hemoglobin at 8.6, previously was 9.2.  Denies any noticeable bleeding in his urine/stool.  Denies any bleeding issues.  Recommend PCP obtain stool sample/FOBT to check for blood in his stool.  Care and ED precautions discussed.  Continue follow-up with hem/onc.    5. Weakness, falls Does admit to recent cancer diagnosis.  Admits to cancer diagnosis and following at [facility] hematology/oncology team.  He is previously declined palliative care.  Admits to 4 mechanical falls since last office visit, denies any acute injuries. Recommend he discussed his mechanical falls with PCP, would strongly benefit from home health therapy.  Care and ED precautions discussed.  Dispo: Will provide refills per his request.  Follow-up with me or MD in 6 months or sooner if anything changes.      Time:   Today, I have spent 29 minutes with the patient with telehealth technology discussing the above problems.     Medication Adjustments/Labs and Tests Ordered: Current medicines are reviewed at length with the patient today.  Concerns regarding medicines are outlined above.   Tests Ordered: No orders of the defined types were placed in this encounter.   Medication Changes: No orders of the defined types were placed in this encounter.   Follow Up: In person in 6 months   Signed, Sharlene Dory, NP  03/08/2023 8:31 AM    [facility]

## 2023-03-08 NOTE — Patient Instructions (Addendum)
 Medication Instructions:  Your physician recommends that you continue on your current medications as directed. Please refer to the Current Medication list given to you today.  Labwork: NONE   Testing/Procedures: NONE   Follow-Up: Your physician recommends that you schedule a follow-up appointment in: 6 Months   Any Other Special Instructions Will Be Listed Below (If Applicable).  If you need a refill on your cardiac medications before your next appointment, please call your pharmacy.

## 2023-03-08 NOTE — Telephone Encounter (Signed)
  Patient Consent for Virtual Visit        Jonathan [name] has provided verbal consent on 03/08/2023 for a virtual visit (video or telephone).   CONSENT FOR VIRTUAL VISIT FOR:  Jonathan [name]  By participating in this virtual visit I agree to the following:  I hereby voluntarily request, consent and authorize [facility] and its employed or contracted physicians, physician assistants, nurse practitioners or other licensed health care professionals (the Practitioner), to provide me with telemedicine health care services (the "Services") as deemed necessary by the treating Practitioner. I acknowledge and consent to receive the Services by the Practitioner via telemedicine. I understand that the telemedicine visit will involve communicating with the Practitioner through live audiovisual communication technology and the disclosure of certain medical information by electronic transmission. I acknowledge that I have been given the opportunity to request an in-person assessment or other available alternative prior to the telemedicine visit and am voluntarily participating in the telemedicine visit.  I understand that I have the right to withhold or withdraw my consent to the use of telemedicine in the course of my care at any time, without affecting my right to future care or treatment, and that the Practitioner or I may terminate the telemedicine visit at any time. I understand that I have the right to inspect all information obtained and/or recorded in the course of the telemedicine visit and may receive copies of available information for a reasonable fee.  I understand that some of the potential risks of receiving the Services via telemedicine include:  Delay or interruption in medical evaluation due to technological equipment failure or disruption; Information transmitted may not be sufficient (e.g. poor resolution of images) to allow for appropriate medical decision making by the  Practitioner; and/or  In rare instances, security protocols could fail, causing a breach of personal health information.  Furthermore, I acknowledge that it is my responsibility to provide information about my medical history, conditions and care that is complete and accurate to the best of my ability. I acknowledge that Practitioner's advice, recommendations, and/or decision may be based on factors not within their control, such as incomplete or inaccurate data provided by me or distortions of diagnostic images or specimens that may result from electronic transmissions. I understand that the practice of medicine is not an exact science and that Practitioner makes no warranties or guarantees regarding treatment outcomes. I acknowledge that a copy of this consent can be made available to me via my patient portal [facility] MyChart), or I can request a printed copy by calling the office of [facility].    I understand that my insurance will be billed for this visit.   I have read or had this consent read to me. I understand the contents of this consent, which adequately explains the benefits and risks of the Services being provided via telemedicine.  I have been provided ample opportunity to ask questions regarding this consent and the Services and have had my questions answered to my satisfaction. I give my informed consent for the services to be provided through the use of telemedicine in my medical care

## 2023-03-11 ENCOUNTER — Telehealth: Payer: Medicare PPO | Admitting: Nurse Practitioner

## 2023-03-11 DIAGNOSIS — M898X5 Other specified disorders of bone, thigh: Secondary | ICD-10-CM | POA: Diagnosis not present

## 2023-03-11 DIAGNOSIS — J439 Emphysema, unspecified: Secondary | ICD-10-CM | POA: Diagnosis not present

## 2023-03-11 DIAGNOSIS — C61 Malignant neoplasm of prostate: Secondary | ICD-10-CM | POA: Diagnosis not present

## 2023-03-11 DIAGNOSIS — J189 Pneumonia, unspecified organism: Secondary | ICD-10-CM | POA: Diagnosis not present

## 2023-03-11 DIAGNOSIS — E1122 Type 2 diabetes mellitus with diabetic chronic kidney disease: Secondary | ICD-10-CM | POA: Diagnosis not present

## 2023-03-11 DIAGNOSIS — C3491 Malignant neoplasm of unspecified part of right bronchus or lung: Secondary | ICD-10-CM | POA: Diagnosis not present

## 2023-03-11 DIAGNOSIS — S91102D Unspecified open wound of left great toe without damage to nail, subsequent encounter: Secondary | ICD-10-CM | POA: Diagnosis not present

## 2023-03-11 DIAGNOSIS — E1143 Type 2 diabetes mellitus with diabetic autonomic (poly)neuropathy: Secondary | ICD-10-CM | POA: Diagnosis not present

## 2023-03-11 DIAGNOSIS — C3412 Malignant neoplasm of upper lobe, left bronchus or lung: Secondary | ICD-10-CM | POA: Diagnosis not present

## 2023-03-11 DIAGNOSIS — I4891 Unspecified atrial fibrillation: Secondary | ICD-10-CM | POA: Diagnosis not present

## 2023-03-13 DIAGNOSIS — E1122 Type 2 diabetes mellitus with diabetic chronic kidney disease: Secondary | ICD-10-CM | POA: Diagnosis not present

## 2023-03-13 DIAGNOSIS — R0689 Other abnormalities of breathing: Secondary | ICD-10-CM | POA: Diagnosis not present

## 2023-03-13 DIAGNOSIS — Z7983 Long term (current) use of bisphosphonates: Secondary | ICD-10-CM | POA: Diagnosis not present

## 2023-03-13 DIAGNOSIS — C61 Malignant neoplasm of prostate: Secondary | ICD-10-CM | POA: Diagnosis not present

## 2023-03-13 DIAGNOSIS — R079 Chest pain, unspecified: Secondary | ICD-10-CM | POA: Diagnosis not present

## 2023-03-13 DIAGNOSIS — N1831 Chronic kidney disease, stage 3a: Secondary | ICD-10-CM | POA: Diagnosis not present

## 2023-03-13 DIAGNOSIS — C3490 Malignant neoplasm of unspecified part of unspecified bronchus or lung: Secondary | ICD-10-CM | POA: Diagnosis not present

## 2023-03-13 DIAGNOSIS — C3411 Malignant neoplasm of upper lobe, right bronchus or lung: Secondary | ICD-10-CM | POA: Diagnosis not present

## 2023-03-13 DIAGNOSIS — R1084 Generalized abdominal pain: Secondary | ICD-10-CM | POA: Diagnosis not present

## 2023-03-13 DIAGNOSIS — R10817 Generalized abdominal tenderness: Secondary | ICD-10-CM | POA: Diagnosis not present

## 2023-03-13 DIAGNOSIS — R1013 Epigastric pain: Secondary | ICD-10-CM | POA: Diagnosis not present

## 2023-03-13 DIAGNOSIS — Z7982 Long term (current) use of aspirin: Secondary | ICD-10-CM | POA: Diagnosis not present

## 2023-03-13 DIAGNOSIS — Z79891 Long term (current) use of opiate analgesic: Secondary | ICD-10-CM | POA: Diagnosis not present

## 2023-03-13 DIAGNOSIS — R109 Unspecified abdominal pain: Secondary | ICD-10-CM | POA: Diagnosis not present

## 2023-03-13 DIAGNOSIS — J984 Other disorders of lung: Secondary | ICD-10-CM | POA: Diagnosis not present

## 2023-03-13 DIAGNOSIS — I491 Atrial premature depolarization: Secondary | ICD-10-CM | POA: Diagnosis not present

## 2023-03-17 DIAGNOSIS — I959 Hypotension, unspecified: Secondary | ICD-10-CM | POA: Diagnosis not present

## 2023-03-17 DIAGNOSIS — N281 Cyst of kidney, acquired: Secondary | ICD-10-CM | POA: Diagnosis not present

## 2023-03-17 DIAGNOSIS — R531 Weakness: Secondary | ICD-10-CM | POA: Diagnosis not present

## 2023-03-17 DIAGNOSIS — N3000 Acute cystitis without hematuria: Secondary | ICD-10-CM | POA: Diagnosis not present

## 2023-03-17 DIAGNOSIS — R1030 Lower abdominal pain, unspecified: Secondary | ICD-10-CM | POA: Diagnosis not present

## 2023-03-17 DIAGNOSIS — I129 Hypertensive chronic kidney disease with stage 1 through stage 4 chronic kidney disease, or unspecified chronic kidney disease: Secondary | ICD-10-CM | POA: Diagnosis not present

## 2023-03-17 DIAGNOSIS — R1033 Periumbilical pain: Secondary | ICD-10-CM | POA: Diagnosis not present

## 2023-03-17 DIAGNOSIS — Z7984 Long term (current) use of oral hypoglycemic drugs: Secondary | ICD-10-CM | POA: Diagnosis not present

## 2023-03-17 DIAGNOSIS — N189 Chronic kidney disease, unspecified: Secondary | ICD-10-CM | POA: Diagnosis not present

## 2023-03-17 DIAGNOSIS — D7389 Other diseases of spleen: Secondary | ICD-10-CM | POA: Diagnosis not present

## 2023-03-17 DIAGNOSIS — R932 Abnormal findings on diagnostic imaging of liver and biliary tract: Secondary | ICD-10-CM | POA: Diagnosis not present

## 2023-03-17 DIAGNOSIS — R1084 Generalized abdominal pain: Secondary | ICD-10-CM | POA: Diagnosis not present

## 2023-03-17 DIAGNOSIS — R109 Unspecified abdominal pain: Secondary | ICD-10-CM | POA: Diagnosis not present

## 2023-03-17 DIAGNOSIS — R918 Other nonspecific abnormal finding of lung field: Secondary | ICD-10-CM | POA: Diagnosis not present

## 2023-03-17 DIAGNOSIS — R1013 Epigastric pain: Secondary | ICD-10-CM | POA: Diagnosis not present

## 2023-03-17 DIAGNOSIS — Z87891 Personal history of nicotine dependence: Secondary | ICD-10-CM | POA: Diagnosis not present

## 2023-03-17 DIAGNOSIS — I7143 Infrarenal abdominal aortic aneurysm, without rupture: Secondary | ICD-10-CM | POA: Diagnosis not present

## 2023-03-17 DIAGNOSIS — E1122 Type 2 diabetes mellitus with diabetic chronic kidney disease: Secondary | ICD-10-CM | POA: Diagnosis not present

## 2023-03-17 DIAGNOSIS — J984 Other disorders of lung: Secondary | ICD-10-CM | POA: Diagnosis not present

## 2023-03-17 DIAGNOSIS — R112 Nausea with vomiting, unspecified: Secondary | ICD-10-CM | POA: Diagnosis not present

## 2023-03-25 DIAGNOSIS — E1122 Type 2 diabetes mellitus with diabetic chronic kidney disease: Secondary | ICD-10-CM | POA: Diagnosis not present

## 2023-03-25 DIAGNOSIS — J449 Chronic obstructive pulmonary disease, unspecified: Secondary | ICD-10-CM | POA: Diagnosis not present

## 2023-03-25 DIAGNOSIS — N1831 Chronic kidney disease, stage 3a: Secondary | ICD-10-CM | POA: Diagnosis not present

## 2023-03-25 DIAGNOSIS — R590 Localized enlarged lymph nodes: Secondary | ICD-10-CM | POA: Diagnosis not present

## 2023-03-25 DIAGNOSIS — I7143 Infrarenal abdominal aortic aneurysm, without rupture: Secondary | ICD-10-CM | POA: Diagnosis not present

## 2023-03-25 DIAGNOSIS — I10 Essential (primary) hypertension: Secondary | ICD-10-CM | POA: Diagnosis not present

## 2023-03-25 DIAGNOSIS — I4891 Unspecified atrial fibrillation: Secondary | ICD-10-CM | POA: Diagnosis not present

## 2023-03-26 DIAGNOSIS — I700 Atherosclerosis of aorta: Secondary | ICD-10-CM | POA: Diagnosis not present

## 2023-03-26 DIAGNOSIS — C8331 Diffuse large B-cell lymphoma, lymph nodes of head, face, and neck: Secondary | ICD-10-CM | POA: Diagnosis not present

## 2023-03-26 DIAGNOSIS — I130 Hypertensive heart and chronic kidney disease with heart failure and stage 1 through stage 4 chronic kidney disease, or unspecified chronic kidney disease: Secondary | ICD-10-CM | POA: Diagnosis not present

## 2023-03-26 DIAGNOSIS — N1831 Chronic kidney disease, stage 3a: Secondary | ICD-10-CM | POA: Diagnosis not present

## 2023-03-26 DIAGNOSIS — C8590 Non-Hodgkin lymphoma, unspecified, unspecified site: Secondary | ICD-10-CM | POA: Diagnosis not present

## 2023-03-26 DIAGNOSIS — R590 Localized enlarged lymph nodes: Secondary | ICD-10-CM | POA: Diagnosis not present

## 2023-03-26 DIAGNOSIS — R591 Generalized enlarged lymph nodes: Secondary | ICD-10-CM | POA: Diagnosis not present

## 2023-03-26 DIAGNOSIS — I4891 Unspecified atrial fibrillation: Secondary | ICD-10-CM | POA: Diagnosis not present

## 2023-03-26 DIAGNOSIS — E1122 Type 2 diabetes mellitus with diabetic chronic kidney disease: Secondary | ICD-10-CM | POA: Diagnosis not present

## 2023-03-26 DIAGNOSIS — I7143 Infrarenal abdominal aortic aneurysm, without rupture: Secondary | ICD-10-CM | POA: Diagnosis not present

## 2023-03-26 DIAGNOSIS — I509 Heart failure, unspecified: Secondary | ICD-10-CM | POA: Diagnosis not present

## 2023-04-01 DIAGNOSIS — C3412 Malignant neoplasm of upper lobe, left bronchus or lung: Secondary | ICD-10-CM | POA: Diagnosis not present

## 2023-04-01 DIAGNOSIS — C61 Malignant neoplasm of prostate: Secondary | ICD-10-CM | POA: Diagnosis not present

## 2023-04-01 DIAGNOSIS — I4891 Unspecified atrial fibrillation: Secondary | ICD-10-CM | POA: Diagnosis not present

## 2023-04-01 DIAGNOSIS — J439 Emphysema, unspecified: Secondary | ICD-10-CM | POA: Diagnosis not present

## 2023-04-01 DIAGNOSIS — C3491 Malignant neoplasm of unspecified part of right bronchus or lung: Secondary | ICD-10-CM | POA: Diagnosis not present

## 2023-04-01 DIAGNOSIS — E1143 Type 2 diabetes mellitus with diabetic autonomic (poly)neuropathy: Secondary | ICD-10-CM | POA: Diagnosis not present

## 2023-04-01 DIAGNOSIS — M898X5 Other specified disorders of bone, thigh: Secondary | ICD-10-CM | POA: Diagnosis not present

## 2023-04-01 DIAGNOSIS — E1122 Type 2 diabetes mellitus with diabetic chronic kidney disease: Secondary | ICD-10-CM | POA: Diagnosis not present

## 2023-04-01 DIAGNOSIS — S91102D Unspecified open wound of left great toe without damage to nail, subsequent encounter: Secondary | ICD-10-CM | POA: Diagnosis not present

## 2023-04-04 DIAGNOSIS — E1122 Type 2 diabetes mellitus with diabetic chronic kidney disease: Secondary | ICD-10-CM | POA: Diagnosis not present

## 2023-04-04 DIAGNOSIS — M25531 Pain in right wrist: Secondary | ICD-10-CM | POA: Diagnosis not present

## 2023-04-04 DIAGNOSIS — Z880 Allergy status to penicillin: Secondary | ICD-10-CM | POA: Diagnosis not present

## 2023-04-04 DIAGNOSIS — N189 Chronic kidney disease, unspecified: Secondary | ICD-10-CM | POA: Diagnosis not present

## 2023-04-04 DIAGNOSIS — Z87891 Personal history of nicotine dependence: Secondary | ICD-10-CM | POA: Diagnosis not present

## 2023-04-04 DIAGNOSIS — R519 Headache, unspecified: Secondary | ICD-10-CM | POA: Diagnosis not present

## 2023-04-04 DIAGNOSIS — I129 Hypertensive chronic kidney disease with stage 1 through stage 4 chronic kidney disease, or unspecified chronic kidney disease: Secondary | ICD-10-CM | POA: Diagnosis not present

## 2023-04-04 DIAGNOSIS — M1811 Unilateral primary osteoarthritis of first carpometacarpal joint, right hand: Secondary | ICD-10-CM | POA: Diagnosis not present

## 2023-04-04 DIAGNOSIS — M48061 Spinal stenosis, lumbar region without neurogenic claudication: Secondary | ICD-10-CM | POA: Diagnosis not present

## 2023-04-04 DIAGNOSIS — K219 Gastro-esophageal reflux disease without esophagitis: Secondary | ICD-10-CM | POA: Diagnosis not present

## 2023-04-04 DIAGNOSIS — I6782 Cerebral ischemia: Secondary | ICD-10-CM | POA: Diagnosis not present

## 2023-04-04 DIAGNOSIS — C799 Secondary malignant neoplasm of unspecified site: Secondary | ICD-10-CM | POA: Diagnosis not present

## 2023-04-04 DIAGNOSIS — G629 Polyneuropathy, unspecified: Secondary | ICD-10-CM | POA: Diagnosis not present

## 2023-04-04 DIAGNOSIS — R29898 Other symptoms and signs involving the musculoskeletal system: Secondary | ICD-10-CM | POA: Diagnosis not present

## 2023-04-04 DIAGNOSIS — Z888 Allergy status to other drugs, medicaments and biological substances status: Secondary | ICD-10-CM | POA: Diagnosis not present

## 2023-04-04 DIAGNOSIS — R531 Weakness: Secondary | ICD-10-CM | POA: Diagnosis not present

## 2023-04-04 DIAGNOSIS — W06XXXA Fall from bed, initial encounter: Secondary | ICD-10-CM | POA: Diagnosis not present

## 2023-04-04 DIAGNOSIS — I7143 Infrarenal abdominal aortic aneurysm, without rupture: Secondary | ICD-10-CM | POA: Diagnosis not present

## 2023-04-04 DIAGNOSIS — M47817 Spondylosis without myelopathy or radiculopathy, lumbosacral region: Secondary | ICD-10-CM | POA: Diagnosis not present

## 2023-04-04 DIAGNOSIS — N281 Cyst of kidney, acquired: Secondary | ICD-10-CM | POA: Diagnosis not present

## 2023-04-04 DIAGNOSIS — C8590 Non-Hodgkin lymphoma, unspecified, unspecified site: Secondary | ICD-10-CM | POA: Diagnosis not present

## 2023-04-04 DIAGNOSIS — N390 Urinary tract infection, site not specified: Secondary | ICD-10-CM | POA: Diagnosis not present

## 2023-04-04 DIAGNOSIS — M47816 Spondylosis without myelopathy or radiculopathy, lumbar region: Secondary | ICD-10-CM | POA: Diagnosis not present

## 2023-04-05 DIAGNOSIS — G3184 Mild cognitive impairment, so stated: Secondary | ICD-10-CM | POA: Diagnosis not present

## 2023-04-05 DIAGNOSIS — I130 Hypertensive heart and chronic kidney disease with heart failure and stage 1 through stage 4 chronic kidney disease, or unspecified chronic kidney disease: Secondary | ICD-10-CM | POA: Diagnosis not present

## 2023-04-05 DIAGNOSIS — Z880 Allergy status to penicillin: Secondary | ICD-10-CM | POA: Diagnosis not present

## 2023-04-05 DIAGNOSIS — R29898 Other symptoms and signs involving the musculoskeletal system: Secondary | ICD-10-CM | POA: Diagnosis not present

## 2023-04-05 DIAGNOSIS — I509 Heart failure, unspecified: Secondary | ICD-10-CM | POA: Diagnosis not present

## 2023-04-05 DIAGNOSIS — M8958 Osteolysis, other site: Secondary | ICD-10-CM | POA: Diagnosis not present

## 2023-04-05 DIAGNOSIS — C8470 Anaplastic large cell lymphoma, ALK-negative, unspecified site: Secondary | ICD-10-CM | POA: Diagnosis not present

## 2023-04-05 DIAGNOSIS — G893 Neoplasm related pain (acute) (chronic): Secondary | ICD-10-CM | POA: Diagnosis not present

## 2023-04-05 DIAGNOSIS — I129 Hypertensive chronic kidney disease with stage 1 through stage 4 chronic kidney disease, or unspecified chronic kidney disease: Secondary | ICD-10-CM | POA: Diagnosis not present

## 2023-04-05 DIAGNOSIS — Z87891 Personal history of nicotine dependence: Secondary | ICD-10-CM | POA: Diagnosis not present

## 2023-04-05 DIAGNOSIS — R531 Weakness: Secondary | ICD-10-CM | POA: Diagnosis not present

## 2023-04-05 DIAGNOSIS — C8468 Anaplastic large cell lymphoma, ALK-positive, lymph nodes of multiple sites: Secondary | ICD-10-CM | POA: Diagnosis not present

## 2023-04-05 DIAGNOSIS — N189 Chronic kidney disease, unspecified: Secondary | ICD-10-CM | POA: Diagnosis not present

## 2023-04-05 DIAGNOSIS — D84822 Immunodeficiency due to external causes: Secondary | ICD-10-CM | POA: Diagnosis not present

## 2023-04-05 DIAGNOSIS — R530 Neoplastic (malignant) related fatigue: Secondary | ICD-10-CM | POA: Diagnosis not present

## 2023-04-05 DIAGNOSIS — K219 Gastro-esophageal reflux disease without esophagitis: Secondary | ICD-10-CM | POA: Diagnosis not present

## 2023-04-05 DIAGNOSIS — E236 Other disorders of pituitary gland: Secondary | ICD-10-CM | POA: Diagnosis not present

## 2023-04-05 DIAGNOSIS — M79604 Pain in right leg: Secondary | ICD-10-CM | POA: Diagnosis not present

## 2023-04-05 DIAGNOSIS — I4891 Unspecified atrial fibrillation: Secondary | ICD-10-CM | POA: Diagnosis not present

## 2023-04-05 DIAGNOSIS — D352 Benign neoplasm of pituitary gland: Secondary | ICD-10-CM | POA: Diagnosis not present

## 2023-04-05 DIAGNOSIS — M79605 Pain in left leg: Secondary | ICD-10-CM | POA: Diagnosis not present

## 2023-04-05 DIAGNOSIS — Z888 Allergy status to other drugs, medicaments and biological substances status: Secondary | ICD-10-CM | POA: Diagnosis not present

## 2023-04-05 DIAGNOSIS — Z7401 Bed confinement status: Secondary | ICD-10-CM | POA: Diagnosis not present

## 2023-04-05 DIAGNOSIS — M6281 Muscle weakness (generalized): Secondary | ICD-10-CM | POA: Diagnosis not present

## 2023-04-05 DIAGNOSIS — N390 Urinary tract infection, site not specified: Secondary | ICD-10-CM | POA: Diagnosis not present

## 2023-04-05 DIAGNOSIS — C9002 Multiple myeloma in relapse: Secondary | ICD-10-CM | POA: Diagnosis not present

## 2023-04-05 DIAGNOSIS — C8590 Non-Hodgkin lymphoma, unspecified, unspecified site: Secondary | ICD-10-CM | POA: Diagnosis not present

## 2023-04-05 DIAGNOSIS — E1122 Type 2 diabetes mellitus with diabetic chronic kidney disease: Secondary | ICD-10-CM | POA: Diagnosis not present

## 2023-04-06 DIAGNOSIS — C8590 Non-Hodgkin lymphoma, unspecified, unspecified site: Secondary | ICD-10-CM | POA: Diagnosis not present

## 2023-04-06 DIAGNOSIS — E236 Other disorders of pituitary gland: Secondary | ICD-10-CM | POA: Diagnosis not present

## 2023-04-11 DIAGNOSIS — S91102D Unspecified open wound of left great toe without damage to nail, subsequent encounter: Secondary | ICD-10-CM | POA: Diagnosis not present

## 2023-04-11 DIAGNOSIS — C3412 Malignant neoplasm of upper lobe, left bronchus or lung: Secondary | ICD-10-CM | POA: Diagnosis not present

## 2023-04-11 DIAGNOSIS — E1122 Type 2 diabetes mellitus with diabetic chronic kidney disease: Secondary | ICD-10-CM | POA: Diagnosis not present

## 2023-04-11 DIAGNOSIS — E1143 Type 2 diabetes mellitus with diabetic autonomic (poly)neuropathy: Secondary | ICD-10-CM | POA: Diagnosis not present

## 2023-04-11 DIAGNOSIS — M898X5 Other specified disorders of bone, thigh: Secondary | ICD-10-CM | POA: Diagnosis not present

## 2023-04-11 DIAGNOSIS — J439 Emphysema, unspecified: Secondary | ICD-10-CM | POA: Diagnosis not present

## 2023-04-11 DIAGNOSIS — C61 Malignant neoplasm of prostate: Secondary | ICD-10-CM | POA: Diagnosis not present

## 2023-04-11 DIAGNOSIS — J189 Pneumonia, unspecified organism: Secondary | ICD-10-CM | POA: Diagnosis not present

## 2023-04-11 DIAGNOSIS — C3491 Malignant neoplasm of unspecified part of right bronchus or lung: Secondary | ICD-10-CM | POA: Diagnosis not present

## 2023-04-11 DIAGNOSIS — I4891 Unspecified atrial fibrillation: Secondary | ICD-10-CM | POA: Diagnosis not present

## 2023-04-12 DIAGNOSIS — C8470 Anaplastic large cell lymphoma, ALK-negative, unspecified site: Secondary | ICD-10-CM | POA: Diagnosis not present

## 2023-04-12 DIAGNOSIS — T451X5A Adverse effect of antineoplastic and immunosuppressive drugs, initial encounter: Secondary | ICD-10-CM | POA: Diagnosis not present

## 2023-04-12 DIAGNOSIS — D701 Agranulocytosis secondary to cancer chemotherapy: Secondary | ICD-10-CM | POA: Diagnosis not present

## 2023-04-12 DIAGNOSIS — Z79899 Other long term (current) drug therapy: Secondary | ICD-10-CM | POA: Diagnosis not present

## 2023-04-15 DIAGNOSIS — J439 Emphysema, unspecified: Secondary | ICD-10-CM | POA: Diagnosis not present

## 2023-04-15 DIAGNOSIS — E1143 Type 2 diabetes mellitus with diabetic autonomic (poly)neuropathy: Secondary | ICD-10-CM | POA: Diagnosis not present

## 2023-04-15 DIAGNOSIS — C3412 Malignant neoplasm of upper lobe, left bronchus or lung: Secondary | ICD-10-CM | POA: Diagnosis not present

## 2023-04-15 DIAGNOSIS — I4891 Unspecified atrial fibrillation: Secondary | ICD-10-CM | POA: Diagnosis not present

## 2023-04-15 DIAGNOSIS — C3491 Malignant neoplasm of unspecified part of right bronchus or lung: Secondary | ICD-10-CM | POA: Diagnosis not present

## 2023-04-15 DIAGNOSIS — C61 Malignant neoplasm of prostate: Secondary | ICD-10-CM | POA: Diagnosis not present

## 2023-04-15 DIAGNOSIS — S91102D Unspecified open wound of left great toe without damage to nail, subsequent encounter: Secondary | ICD-10-CM | POA: Diagnosis not present

## 2023-04-15 DIAGNOSIS — M898X5 Other specified disorders of bone, thigh: Secondary | ICD-10-CM | POA: Diagnosis not present

## 2023-04-15 DIAGNOSIS — E1122 Type 2 diabetes mellitus with diabetic chronic kidney disease: Secondary | ICD-10-CM | POA: Diagnosis not present

## 2023-04-17 DIAGNOSIS — R110 Nausea: Secondary | ICD-10-CM | POA: Diagnosis not present

## 2023-04-17 DIAGNOSIS — E1122 Type 2 diabetes mellitus with diabetic chronic kidney disease: Secondary | ICD-10-CM | POA: Diagnosis not present

## 2023-04-17 DIAGNOSIS — R918 Other nonspecific abnormal finding of lung field: Secondary | ICD-10-CM | POA: Diagnosis not present

## 2023-04-17 DIAGNOSIS — Z881 Allergy status to other antibiotic agents status: Secondary | ICD-10-CM | POA: Diagnosis not present

## 2023-04-17 DIAGNOSIS — R109 Unspecified abdominal pain: Secondary | ICD-10-CM | POA: Diagnosis not present

## 2023-04-17 DIAGNOSIS — I129 Hypertensive chronic kidney disease with stage 1 through stage 4 chronic kidney disease, or unspecified chronic kidney disease: Secondary | ICD-10-CM | POA: Diagnosis not present

## 2023-04-17 DIAGNOSIS — R112 Nausea with vomiting, unspecified: Secondary | ICD-10-CM | POA: Diagnosis not present

## 2023-04-17 DIAGNOSIS — N189 Chronic kidney disease, unspecified: Secondary | ICD-10-CM | POA: Diagnosis not present

## 2023-04-17 DIAGNOSIS — K529 Noninfective gastroenteritis and colitis, unspecified: Secondary | ICD-10-CM | POA: Diagnosis not present

## 2023-04-17 DIAGNOSIS — Z87891 Personal history of nicotine dependence: Secondary | ICD-10-CM | POA: Diagnosis not present

## 2023-04-17 DIAGNOSIS — Z79899 Other long term (current) drug therapy: Secondary | ICD-10-CM | POA: Diagnosis not present

## 2023-04-17 DIAGNOSIS — R000 Tachycardia, unspecified: Secondary | ICD-10-CM | POA: Diagnosis not present

## 2023-04-17 DIAGNOSIS — R531 Weakness: Secondary | ICD-10-CM | POA: Diagnosis not present

## 2023-04-18 DIAGNOSIS — R1084 Generalized abdominal pain: Secondary | ICD-10-CM | POA: Diagnosis not present

## 2023-04-18 DIAGNOSIS — E1122 Type 2 diabetes mellitus with diabetic chronic kidney disease: Secondary | ICD-10-CM | POA: Diagnosis not present

## 2023-04-18 DIAGNOSIS — I129 Hypertensive chronic kidney disease with stage 1 through stage 4 chronic kidney disease, or unspecified chronic kidney disease: Secondary | ICD-10-CM | POA: Diagnosis not present

## 2023-04-18 DIAGNOSIS — I714 Abdominal aortic aneurysm, without rupture, unspecified: Secondary | ICD-10-CM | POA: Diagnosis not present

## 2023-04-18 DIAGNOSIS — K219 Gastro-esophageal reflux disease without esophagitis: Secondary | ICD-10-CM | POA: Diagnosis not present

## 2023-04-18 DIAGNOSIS — Z880 Allergy status to penicillin: Secondary | ICD-10-CM | POA: Diagnosis not present

## 2023-04-18 DIAGNOSIS — Z87891 Personal history of nicotine dependence: Secondary | ICD-10-CM | POA: Diagnosis not present

## 2023-04-18 DIAGNOSIS — A0472 Enterocolitis due to Clostridium difficile, not specified as recurrent: Secondary | ICD-10-CM | POA: Diagnosis not present

## 2023-04-18 DIAGNOSIS — R197 Diarrhea, unspecified: Secondary | ICD-10-CM | POA: Diagnosis not present

## 2023-04-18 DIAGNOSIS — N189 Chronic kidney disease, unspecified: Secondary | ICD-10-CM | POA: Diagnosis not present

## 2023-04-18 DIAGNOSIS — Z881 Allergy status to other antibiotic agents status: Secondary | ICD-10-CM | POA: Diagnosis not present

## 2023-04-22 ENCOUNTER — Institutional Professional Consult (permissible substitution): Payer: TRICARE For Life (TFL) | Admitting: Diagnostic Neuroimaging

## 2023-04-22 DIAGNOSIS — T451X5D Adverse effect of antineoplastic and immunosuppressive drugs, subsequent encounter: Secondary | ICD-10-CM | POA: Diagnosis not present

## 2023-04-22 DIAGNOSIS — C3411 Malignant neoplasm of upper lobe, right bronchus or lung: Secondary | ICD-10-CM | POA: Diagnosis not present

## 2023-04-22 DIAGNOSIS — Z5111 Encounter for antineoplastic chemotherapy: Secondary | ICD-10-CM | POA: Diagnosis not present

## 2023-04-22 DIAGNOSIS — C8470 Anaplastic large cell lymphoma, ALK-negative, unspecified site: Secondary | ICD-10-CM | POA: Diagnosis not present

## 2023-04-23 DIAGNOSIS — I4891 Unspecified atrial fibrillation: Secondary | ICD-10-CM | POA: Diagnosis not present

## 2023-04-23 DIAGNOSIS — M898X5 Other specified disorders of bone, thigh: Secondary | ICD-10-CM | POA: Diagnosis not present

## 2023-04-23 DIAGNOSIS — S91102D Unspecified open wound of left great toe without damage to nail, subsequent encounter: Secondary | ICD-10-CM | POA: Diagnosis not present

## 2023-04-23 DIAGNOSIS — C61 Malignant neoplasm of prostate: Secondary | ICD-10-CM | POA: Diagnosis not present

## 2023-04-23 DIAGNOSIS — E1122 Type 2 diabetes mellitus with diabetic chronic kidney disease: Secondary | ICD-10-CM | POA: Diagnosis not present

## 2023-04-23 DIAGNOSIS — C3491 Malignant neoplasm of unspecified part of right bronchus or lung: Secondary | ICD-10-CM | POA: Diagnosis not present

## 2023-04-23 DIAGNOSIS — J439 Emphysema, unspecified: Secondary | ICD-10-CM | POA: Diagnosis not present

## 2023-04-23 DIAGNOSIS — E1143 Type 2 diabetes mellitus with diabetic autonomic (poly)neuropathy: Secondary | ICD-10-CM | POA: Diagnosis not present

## 2023-04-23 DIAGNOSIS — C3412 Malignant neoplasm of upper lobe, left bronchus or lung: Secondary | ICD-10-CM | POA: Diagnosis not present

## 2023-04-26 DIAGNOSIS — C61 Malignant neoplasm of prostate: Secondary | ICD-10-CM | POA: Diagnosis not present

## 2023-04-26 DIAGNOSIS — S91102D Unspecified open wound of left great toe without damage to nail, subsequent encounter: Secondary | ICD-10-CM | POA: Diagnosis not present

## 2023-04-26 DIAGNOSIS — I4891 Unspecified atrial fibrillation: Secondary | ICD-10-CM | POA: Diagnosis not present

## 2023-04-26 DIAGNOSIS — E1122 Type 2 diabetes mellitus with diabetic chronic kidney disease: Secondary | ICD-10-CM | POA: Diagnosis not present

## 2023-04-26 DIAGNOSIS — M898X5 Other specified disorders of bone, thigh: Secondary | ICD-10-CM | POA: Diagnosis not present

## 2023-04-26 DIAGNOSIS — E1143 Type 2 diabetes mellitus with diabetic autonomic (poly)neuropathy: Secondary | ICD-10-CM | POA: Diagnosis not present

## 2023-04-26 DIAGNOSIS — J439 Emphysema, unspecified: Secondary | ICD-10-CM | POA: Diagnosis not present

## 2023-04-26 DIAGNOSIS — C3491 Malignant neoplasm of unspecified part of right bronchus or lung: Secondary | ICD-10-CM | POA: Diagnosis not present

## 2023-04-26 DIAGNOSIS — C3412 Malignant neoplasm of upper lobe, left bronchus or lung: Secondary | ICD-10-CM | POA: Diagnosis not present

## 2023-04-29 DIAGNOSIS — C8470 Anaplastic large cell lymphoma, ALK-negative, unspecified site: Secondary | ICD-10-CM | POA: Diagnosis not present

## 2023-04-29 DIAGNOSIS — Z452 Encounter for adjustment and management of vascular access device: Secondary | ICD-10-CM | POA: Diagnosis not present

## 2023-04-30 DIAGNOSIS — I4891 Unspecified atrial fibrillation: Secondary | ICD-10-CM | POA: Diagnosis not present

## 2023-04-30 DIAGNOSIS — E1122 Type 2 diabetes mellitus with diabetic chronic kidney disease: Secondary | ICD-10-CM | POA: Diagnosis not present

## 2023-04-30 DIAGNOSIS — C3491 Malignant neoplasm of unspecified part of right bronchus or lung: Secondary | ICD-10-CM | POA: Diagnosis not present

## 2023-04-30 DIAGNOSIS — M898X5 Other specified disorders of bone, thigh: Secondary | ICD-10-CM | POA: Diagnosis not present

## 2023-04-30 DIAGNOSIS — C61 Malignant neoplasm of prostate: Secondary | ICD-10-CM | POA: Diagnosis not present

## 2023-04-30 DIAGNOSIS — C3412 Malignant neoplasm of upper lobe, left bronchus or lung: Secondary | ICD-10-CM | POA: Diagnosis not present

## 2023-04-30 DIAGNOSIS — E1143 Type 2 diabetes mellitus with diabetic autonomic (poly)neuropathy: Secondary | ICD-10-CM | POA: Diagnosis not present

## 2023-04-30 DIAGNOSIS — J439 Emphysema, unspecified: Secondary | ICD-10-CM | POA: Diagnosis not present

## 2023-04-30 DIAGNOSIS — C8470 Anaplastic large cell lymphoma, ALK-negative, unspecified site: Secondary | ICD-10-CM | POA: Diagnosis not present

## 2023-04-30 DIAGNOSIS — S91102D Unspecified open wound of left great toe without damage to nail, subsequent encounter: Secondary | ICD-10-CM | POA: Diagnosis not present

## 2023-05-02 DIAGNOSIS — M898X5 Other specified disorders of bone, thigh: Secondary | ICD-10-CM | POA: Diagnosis not present

## 2023-05-02 DIAGNOSIS — C3412 Malignant neoplasm of upper lobe, left bronchus or lung: Secondary | ICD-10-CM | POA: Diagnosis not present

## 2023-05-02 DIAGNOSIS — S91102D Unspecified open wound of left great toe without damage to nail, subsequent encounter: Secondary | ICD-10-CM | POA: Diagnosis not present

## 2023-05-02 DIAGNOSIS — I4891 Unspecified atrial fibrillation: Secondary | ICD-10-CM | POA: Diagnosis not present

## 2023-05-02 DIAGNOSIS — E1143 Type 2 diabetes mellitus with diabetic autonomic (poly)neuropathy: Secondary | ICD-10-CM | POA: Diagnosis not present

## 2023-05-02 DIAGNOSIS — E1122 Type 2 diabetes mellitus with diabetic chronic kidney disease: Secondary | ICD-10-CM | POA: Diagnosis not present

## 2023-05-02 DIAGNOSIS — C3491 Malignant neoplasm of unspecified part of right bronchus or lung: Secondary | ICD-10-CM | POA: Diagnosis not present

## 2023-05-02 DIAGNOSIS — C61 Malignant neoplasm of prostate: Secondary | ICD-10-CM | POA: Diagnosis not present

## 2023-05-02 DIAGNOSIS — J439 Emphysema, unspecified: Secondary | ICD-10-CM | POA: Diagnosis not present

## 2023-05-06 DIAGNOSIS — G894 Chronic pain syndrome: Secondary | ICD-10-CM | POA: Diagnosis not present

## 2023-05-06 DIAGNOSIS — S91102D Unspecified open wound of left great toe without damage to nail, subsequent encounter: Secondary | ICD-10-CM | POA: Diagnosis not present

## 2023-05-06 DIAGNOSIS — C61 Malignant neoplasm of prostate: Secondary | ICD-10-CM | POA: Diagnosis not present

## 2023-05-06 DIAGNOSIS — I4891 Unspecified atrial fibrillation: Secondary | ICD-10-CM | POA: Diagnosis not present

## 2023-05-06 DIAGNOSIS — C3412 Malignant neoplasm of upper lobe, left bronchus or lung: Secondary | ICD-10-CM | POA: Diagnosis not present

## 2023-05-06 DIAGNOSIS — E1122 Type 2 diabetes mellitus with diabetic chronic kidney disease: Secondary | ICD-10-CM | POA: Diagnosis not present

## 2023-05-06 DIAGNOSIS — C3491 Malignant neoplasm of unspecified part of right bronchus or lung: Secondary | ICD-10-CM | POA: Diagnosis not present

## 2023-05-06 DIAGNOSIS — E1143 Type 2 diabetes mellitus with diabetic autonomic (poly)neuropathy: Secondary | ICD-10-CM | POA: Diagnosis not present

## 2023-05-06 DIAGNOSIS — M898X5 Other specified disorders of bone, thigh: Secondary | ICD-10-CM | POA: Diagnosis not present

## 2023-05-06 DIAGNOSIS — C8580 Other specified types of non-Hodgkin lymphoma, unspecified site: Secondary | ICD-10-CM | POA: Diagnosis not present

## 2023-05-06 DIAGNOSIS — J439 Emphysema, unspecified: Secondary | ICD-10-CM | POA: Diagnosis not present

## 2023-05-07 DIAGNOSIS — D701 Agranulocytosis secondary to cancer chemotherapy: Secondary | ICD-10-CM | POA: Diagnosis not present

## 2023-05-07 DIAGNOSIS — C8470 Anaplastic large cell lymphoma, ALK-negative, unspecified site: Secondary | ICD-10-CM | POA: Diagnosis not present

## 2023-05-07 DIAGNOSIS — T451X5A Adverse effect of antineoplastic and immunosuppressive drugs, initial encounter: Secondary | ICD-10-CM | POA: Diagnosis not present

## 2023-05-07 DIAGNOSIS — C8474 Anaplastic large cell lymphoma, ALK-negative, lymph nodes of axilla and upper limb: Secondary | ICD-10-CM | POA: Diagnosis not present

## 2023-05-08 DIAGNOSIS — Z5112 Encounter for antineoplastic immunotherapy: Secondary | ICD-10-CM | POA: Diagnosis not present

## 2023-05-08 DIAGNOSIS — Z7689 Persons encountering health services in other specified circumstances: Secondary | ICD-10-CM | POA: Diagnosis not present

## 2023-05-08 DIAGNOSIS — Z5111 Encounter for antineoplastic chemotherapy: Secondary | ICD-10-CM | POA: Diagnosis not present

## 2023-05-08 DIAGNOSIS — D701 Agranulocytosis secondary to cancer chemotherapy: Secondary | ICD-10-CM | POA: Diagnosis not present

## 2023-05-08 DIAGNOSIS — C8470 Anaplastic large cell lymphoma, ALK-negative, unspecified site: Secondary | ICD-10-CM | POA: Diagnosis not present

## 2023-05-08 DIAGNOSIS — T451X5A Adverse effect of antineoplastic and immunosuppressive drugs, initial encounter: Secondary | ICD-10-CM | POA: Diagnosis not present

## 2023-05-09 DIAGNOSIS — T451X5A Adverse effect of antineoplastic and immunosuppressive drugs, initial encounter: Secondary | ICD-10-CM | POA: Diagnosis not present

## 2023-05-09 DIAGNOSIS — C8470 Anaplastic large cell lymphoma, ALK-negative, unspecified site: Secondary | ICD-10-CM | POA: Diagnosis not present

## 2023-05-09 DIAGNOSIS — Z7689 Persons encountering health services in other specified circumstances: Secondary | ICD-10-CM | POA: Diagnosis not present

## 2023-05-09 DIAGNOSIS — D701 Agranulocytosis secondary to cancer chemotherapy: Secondary | ICD-10-CM | POA: Diagnosis not present

## 2023-05-12 DIAGNOSIS — J189 Pneumonia, unspecified organism: Secondary | ICD-10-CM | POA: Diagnosis not present

## 2023-05-13 DIAGNOSIS — G893 Neoplasm related pain (acute) (chronic): Secondary | ICD-10-CM | POA: Diagnosis not present

## 2023-05-13 DIAGNOSIS — C8461 Anaplastic large cell lymphoma, ALK-positive, lymph nodes of head, face, and neck: Secondary | ICD-10-CM | POA: Diagnosis not present

## 2023-05-13 DIAGNOSIS — C3412 Malignant neoplasm of upper lobe, left bronchus or lung: Secondary | ICD-10-CM | POA: Diagnosis not present

## 2023-05-13 DIAGNOSIS — D701 Agranulocytosis secondary to cancer chemotherapy: Secondary | ICD-10-CM | POA: Diagnosis not present

## 2023-05-13 DIAGNOSIS — S90421D Blister (nonthermal), right great toe, subsequent encounter: Secondary | ICD-10-CM | POA: Diagnosis not present

## 2023-05-13 DIAGNOSIS — D630 Anemia in neoplastic disease: Secondary | ICD-10-CM | POA: Diagnosis not present

## 2023-05-13 DIAGNOSIS — C61 Malignant neoplasm of prostate: Secondary | ICD-10-CM | POA: Diagnosis not present

## 2023-05-13 DIAGNOSIS — C3491 Malignant neoplasm of unspecified part of right bronchus or lung: Secondary | ICD-10-CM | POA: Diagnosis not present

## 2023-05-13 DIAGNOSIS — T451X5D Adverse effect of antineoplastic and immunosuppressive drugs, subsequent encounter: Secondary | ICD-10-CM | POA: Diagnosis not present

## 2023-05-20 DIAGNOSIS — C3491 Malignant neoplasm of unspecified part of right bronchus or lung: Secondary | ICD-10-CM | POA: Diagnosis not present

## 2023-05-20 DIAGNOSIS — C3412 Malignant neoplasm of upper lobe, left bronchus or lung: Secondary | ICD-10-CM | POA: Diagnosis not present

## 2023-05-20 DIAGNOSIS — T451X5D Adverse effect of antineoplastic and immunosuppressive drugs, subsequent encounter: Secondary | ICD-10-CM | POA: Diagnosis not present

## 2023-05-20 DIAGNOSIS — D630 Anemia in neoplastic disease: Secondary | ICD-10-CM | POA: Diagnosis not present

## 2023-05-20 DIAGNOSIS — S90421D Blister (nonthermal), right great toe, subsequent encounter: Secondary | ICD-10-CM | POA: Diagnosis not present

## 2023-05-20 DIAGNOSIS — C61 Malignant neoplasm of prostate: Secondary | ICD-10-CM | POA: Diagnosis not present

## 2023-05-20 DIAGNOSIS — C8461 Anaplastic large cell lymphoma, ALK-positive, lymph nodes of head, face, and neck: Secondary | ICD-10-CM | POA: Diagnosis not present

## 2023-05-20 DIAGNOSIS — G893 Neoplasm related pain (acute) (chronic): Secondary | ICD-10-CM | POA: Diagnosis not present

## 2023-05-20 DIAGNOSIS — D701 Agranulocytosis secondary to cancer chemotherapy: Secondary | ICD-10-CM | POA: Diagnosis not present

## 2023-05-27 DIAGNOSIS — S90421D Blister (nonthermal), right great toe, subsequent encounter: Secondary | ICD-10-CM | POA: Diagnosis not present

## 2023-05-27 DIAGNOSIS — C8461 Anaplastic large cell lymphoma, ALK-positive, lymph nodes of head, face, and neck: Secondary | ICD-10-CM | POA: Diagnosis not present

## 2023-05-27 DIAGNOSIS — C3412 Malignant neoplasm of upper lobe, left bronchus or lung: Secondary | ICD-10-CM | POA: Diagnosis not present

## 2023-05-27 DIAGNOSIS — G893 Neoplasm related pain (acute) (chronic): Secondary | ICD-10-CM | POA: Diagnosis not present

## 2023-05-27 DIAGNOSIS — D701 Agranulocytosis secondary to cancer chemotherapy: Secondary | ICD-10-CM | POA: Diagnosis not present

## 2023-05-27 DIAGNOSIS — C61 Malignant neoplasm of prostate: Secondary | ICD-10-CM | POA: Diagnosis not present

## 2023-05-27 DIAGNOSIS — T451X5D Adverse effect of antineoplastic and immunosuppressive drugs, subsequent encounter: Secondary | ICD-10-CM | POA: Diagnosis not present

## 2023-05-27 DIAGNOSIS — C3491 Malignant neoplasm of unspecified part of right bronchus or lung: Secondary | ICD-10-CM | POA: Diagnosis not present

## 2023-05-27 DIAGNOSIS — D630 Anemia in neoplastic disease: Secondary | ICD-10-CM | POA: Diagnosis not present

## 2023-05-28 DIAGNOSIS — C8470 Anaplastic large cell lymphoma, ALK-negative, unspecified site: Secondary | ICD-10-CM | POA: Diagnosis not present

## 2023-05-28 DIAGNOSIS — C8468 Anaplastic large cell lymphoma, ALK-positive, lymph nodes of multiple sites: Secondary | ICD-10-CM | POA: Diagnosis not present

## 2023-05-28 DIAGNOSIS — T451X5A Adverse effect of antineoplastic and immunosuppressive drugs, initial encounter: Secondary | ICD-10-CM | POA: Diagnosis not present

## 2023-05-28 DIAGNOSIS — D701 Agranulocytosis secondary to cancer chemotherapy: Secondary | ICD-10-CM | POA: Diagnosis not present

## 2023-05-29 DIAGNOSIS — Z5112 Encounter for antineoplastic immunotherapy: Secondary | ICD-10-CM | POA: Diagnosis not present

## 2023-05-29 DIAGNOSIS — C8470 Anaplastic large cell lymphoma, ALK-negative, unspecified site: Secondary | ICD-10-CM | POA: Diagnosis not present

## 2023-05-29 DIAGNOSIS — T451X5A Adverse effect of antineoplastic and immunosuppressive drugs, initial encounter: Secondary | ICD-10-CM | POA: Diagnosis not present

## 2023-05-29 DIAGNOSIS — Z5111 Encounter for antineoplastic chemotherapy: Secondary | ICD-10-CM | POA: Diagnosis not present

## 2023-05-29 DIAGNOSIS — D701 Agranulocytosis secondary to cancer chemotherapy: Secondary | ICD-10-CM | POA: Diagnosis not present

## 2023-05-30 DIAGNOSIS — C8470 Anaplastic large cell lymphoma, ALK-negative, unspecified site: Secondary | ICD-10-CM | POA: Diagnosis not present

## 2023-05-30 DIAGNOSIS — Z79899 Other long term (current) drug therapy: Secondary | ICD-10-CM | POA: Diagnosis not present

## 2023-05-30 DIAGNOSIS — D701 Agranulocytosis secondary to cancer chemotherapy: Secondary | ICD-10-CM | POA: Diagnosis not present

## 2023-05-30 DIAGNOSIS — T451X5A Adverse effect of antineoplastic and immunosuppressive drugs, initial encounter: Secondary | ICD-10-CM | POA: Diagnosis not present

## 2023-06-03 DIAGNOSIS — C3412 Malignant neoplasm of upper lobe, left bronchus or lung: Secondary | ICD-10-CM | POA: Diagnosis not present

## 2023-06-03 DIAGNOSIS — D630 Anemia in neoplastic disease: Secondary | ICD-10-CM | POA: Diagnosis not present

## 2023-06-03 DIAGNOSIS — C8461 Anaplastic large cell lymphoma, ALK-positive, lymph nodes of head, face, and neck: Secondary | ICD-10-CM | POA: Diagnosis not present

## 2023-06-03 DIAGNOSIS — S90421D Blister (nonthermal), right great toe, subsequent encounter: Secondary | ICD-10-CM | POA: Diagnosis not present

## 2023-06-03 DIAGNOSIS — C61 Malignant neoplasm of prostate: Secondary | ICD-10-CM | POA: Diagnosis not present

## 2023-06-03 DIAGNOSIS — G893 Neoplasm related pain (acute) (chronic): Secondary | ICD-10-CM | POA: Diagnosis not present

## 2023-06-03 DIAGNOSIS — C3491 Malignant neoplasm of unspecified part of right bronchus or lung: Secondary | ICD-10-CM | POA: Diagnosis not present

## 2023-06-03 DIAGNOSIS — D701 Agranulocytosis secondary to cancer chemotherapy: Secondary | ICD-10-CM | POA: Diagnosis not present

## 2023-06-03 DIAGNOSIS — T451X5D Adverse effect of antineoplastic and immunosuppressive drugs, subsequent encounter: Secondary | ICD-10-CM | POA: Diagnosis not present

## 2023-06-06 DIAGNOSIS — I129 Hypertensive chronic kidney disease with stage 1 through stage 4 chronic kidney disease, or unspecified chronic kidney disease: Secondary | ICD-10-CM | POA: Diagnosis not present

## 2023-06-06 DIAGNOSIS — T451X5A Adverse effect of antineoplastic and immunosuppressive drugs, initial encounter: Secondary | ICD-10-CM | POA: Diagnosis not present

## 2023-06-06 DIAGNOSIS — K6289 Other specified diseases of anus and rectum: Secondary | ICD-10-CM | POA: Diagnosis not present

## 2023-06-06 DIAGNOSIS — Z79899 Other long term (current) drug therapy: Secondary | ICD-10-CM | POA: Diagnosis not present

## 2023-06-06 DIAGNOSIS — E1122 Type 2 diabetes mellitus with diabetic chronic kidney disease: Secondary | ICD-10-CM | POA: Diagnosis not present

## 2023-06-06 DIAGNOSIS — I7143 Infrarenal abdominal aortic aneurysm, without rupture: Secondary | ICD-10-CM | POA: Diagnosis not present

## 2023-06-06 DIAGNOSIS — D701 Agranulocytosis secondary to cancer chemotherapy: Secondary | ICD-10-CM | POA: Diagnosis not present

## 2023-06-06 DIAGNOSIS — R109 Unspecified abdominal pain: Secondary | ICD-10-CM | POA: Diagnosis not present

## 2023-06-06 DIAGNOSIS — D61818 Other pancytopenia: Secondary | ICD-10-CM | POA: Diagnosis not present

## 2023-06-06 DIAGNOSIS — Z881 Allergy status to other antibiotic agents status: Secondary | ICD-10-CM | POA: Diagnosis not present

## 2023-06-06 DIAGNOSIS — R1030 Lower abdominal pain, unspecified: Secondary | ICD-10-CM | POA: Diagnosis not present

## 2023-06-06 DIAGNOSIS — R918 Other nonspecific abnormal finding of lung field: Secondary | ICD-10-CM | POA: Diagnosis not present

## 2023-06-06 DIAGNOSIS — C8470 Anaplastic large cell lymphoma, ALK-negative, unspecified site: Secondary | ICD-10-CM | POA: Diagnosis not present

## 2023-06-06 DIAGNOSIS — N189 Chronic kidney disease, unspecified: Secondary | ICD-10-CM | POA: Diagnosis not present

## 2023-06-06 DIAGNOSIS — Z7901 Long term (current) use of anticoagulants: Secondary | ICD-10-CM | POA: Diagnosis not present

## 2023-06-06 DIAGNOSIS — N200 Calculus of kidney: Secondary | ICD-10-CM | POA: Diagnosis not present

## 2023-06-06 DIAGNOSIS — Z20822 Contact with and (suspected) exposure to covid-19: Secondary | ICD-10-CM | POA: Diagnosis not present

## 2023-06-06 DIAGNOSIS — G629 Polyneuropathy, unspecified: Secondary | ICD-10-CM | POA: Diagnosis not present

## 2023-06-06 DIAGNOSIS — C9000 Multiple myeloma not having achieved remission: Secondary | ICD-10-CM | POA: Diagnosis not present

## 2023-06-06 DIAGNOSIS — Z880 Allergy status to penicillin: Secondary | ICD-10-CM | POA: Diagnosis not present

## 2023-06-06 DIAGNOSIS — K219 Gastro-esophageal reflux disease without esophagitis: Secondary | ICD-10-CM | POA: Diagnosis not present

## 2023-06-06 DIAGNOSIS — C3411 Malignant neoplasm of upper lobe, right bronchus or lung: Secondary | ICD-10-CM | POA: Diagnosis not present

## 2023-06-07 DIAGNOSIS — R197 Diarrhea, unspecified: Secondary | ICD-10-CM | POA: Diagnosis not present

## 2023-06-07 DIAGNOSIS — N189 Chronic kidney disease, unspecified: Secondary | ICD-10-CM | POA: Diagnosis not present

## 2023-06-07 DIAGNOSIS — C8590 Non-Hodgkin lymphoma, unspecified, unspecified site: Secondary | ICD-10-CM | POA: Diagnosis not present

## 2023-06-07 DIAGNOSIS — D61810 Antineoplastic chemotherapy induced pancytopenia: Secondary | ICD-10-CM | POA: Diagnosis not present

## 2023-06-07 DIAGNOSIS — C9000 Multiple myeloma not having achieved remission: Secondary | ICD-10-CM | POA: Diagnosis not present

## 2023-06-07 DIAGNOSIS — C9100 Acute lymphoblastic leukemia not having achieved remission: Secondary | ICD-10-CM | POA: Diagnosis not present

## 2023-06-07 DIAGNOSIS — E1122 Type 2 diabetes mellitus with diabetic chronic kidney disease: Secondary | ICD-10-CM | POA: Diagnosis not present

## 2023-06-07 DIAGNOSIS — G609 Hereditary and idiopathic neuropathy, unspecified: Secondary | ICD-10-CM | POA: Diagnosis not present

## 2023-06-07 DIAGNOSIS — K219 Gastro-esophageal reflux disease without esophagitis: Secondary | ICD-10-CM | POA: Diagnosis not present

## 2023-06-07 DIAGNOSIS — K6289 Other specified diseases of anus and rectum: Secondary | ICD-10-CM | POA: Diagnosis not present

## 2023-06-07 DIAGNOSIS — D61818 Other pancytopenia: Secondary | ICD-10-CM | POA: Diagnosis not present

## 2023-06-07 DIAGNOSIS — I129 Hypertensive chronic kidney disease with stage 1 through stage 4 chronic kidney disease, or unspecified chronic kidney disease: Secondary | ICD-10-CM | POA: Diagnosis not present

## 2023-06-09 DIAGNOSIS — J189 Pneumonia, unspecified organism: Secondary | ICD-10-CM | POA: Diagnosis not present

## 2023-06-10 DIAGNOSIS — D630 Anemia in neoplastic disease: Secondary | ICD-10-CM | POA: Diagnosis not present

## 2023-06-10 DIAGNOSIS — D701 Agranulocytosis secondary to cancer chemotherapy: Secondary | ICD-10-CM | POA: Diagnosis not present

## 2023-06-10 DIAGNOSIS — G893 Neoplasm related pain (acute) (chronic): Secondary | ICD-10-CM | POA: Diagnosis not present

## 2023-06-10 DIAGNOSIS — C8461 Anaplastic large cell lymphoma, ALK-positive, lymph nodes of head, face, and neck: Secondary | ICD-10-CM | POA: Diagnosis not present

## 2023-06-10 DIAGNOSIS — C3412 Malignant neoplasm of upper lobe, left bronchus or lung: Secondary | ICD-10-CM | POA: Diagnosis not present

## 2023-06-10 DIAGNOSIS — T451X5D Adverse effect of antineoplastic and immunosuppressive drugs, subsequent encounter: Secondary | ICD-10-CM | POA: Diagnosis not present

## 2023-06-10 DIAGNOSIS — S90421D Blister (nonthermal), right great toe, subsequent encounter: Secondary | ICD-10-CM | POA: Diagnosis not present

## 2023-06-10 DIAGNOSIS — C61 Malignant neoplasm of prostate: Secondary | ICD-10-CM | POA: Diagnosis not present

## 2023-06-10 DIAGNOSIS — C3491 Malignant neoplasm of unspecified part of right bronchus or lung: Secondary | ICD-10-CM | POA: Diagnosis not present

## 2023-06-12 DIAGNOSIS — C8470 Anaplastic large cell lymphoma, ALK-negative, unspecified site: Secondary | ICD-10-CM | POA: Diagnosis not present

## 2023-06-12 DIAGNOSIS — T451X5A Adverse effect of antineoplastic and immunosuppressive drugs, initial encounter: Secondary | ICD-10-CM | POA: Diagnosis not present

## 2023-06-12 DIAGNOSIS — D701 Agranulocytosis secondary to cancer chemotherapy: Secondary | ICD-10-CM | POA: Diagnosis not present

## 2023-06-17 DIAGNOSIS — C8468 Anaplastic large cell lymphoma, ALK-positive, lymph nodes of multiple sites: Secondary | ICD-10-CM | POA: Diagnosis not present

## 2023-06-17 DIAGNOSIS — I7143 Infrarenal abdominal aortic aneurysm, without rupture: Secondary | ICD-10-CM | POA: Diagnosis not present

## 2023-06-17 DIAGNOSIS — M898X8 Other specified disorders of bone, other site: Secondary | ICD-10-CM | POA: Diagnosis not present

## 2023-06-17 DIAGNOSIS — R918 Other nonspecific abnormal finding of lung field: Secondary | ICD-10-CM | POA: Diagnosis not present

## 2023-06-17 DIAGNOSIS — R933 Abnormal findings on diagnostic imaging of other parts of digestive tract: Secondary | ICD-10-CM | POA: Diagnosis not present

## 2023-06-17 DIAGNOSIS — R9389 Abnormal findings on diagnostic imaging of other specified body structures: Secondary | ICD-10-CM | POA: Diagnosis not present

## 2023-06-18 DIAGNOSIS — C8470 Anaplastic large cell lymphoma, ALK-negative, unspecified site: Secondary | ICD-10-CM | POA: Diagnosis not present

## 2023-06-18 DIAGNOSIS — I4811 Longstanding persistent atrial fibrillation: Secondary | ICD-10-CM | POA: Diagnosis not present

## 2023-06-18 DIAGNOSIS — D701 Agranulocytosis secondary to cancer chemotherapy: Secondary | ICD-10-CM | POA: Diagnosis not present

## 2023-06-18 DIAGNOSIS — N1831 Chronic kidney disease, stage 3a: Secondary | ICD-10-CM | POA: Diagnosis not present

## 2023-06-18 DIAGNOSIS — T451X5A Adverse effect of antineoplastic and immunosuppressive drugs, initial encounter: Secondary | ICD-10-CM | POA: Diagnosis not present

## 2023-06-19 DIAGNOSIS — C8470 Anaplastic large cell lymphoma, ALK-negative, unspecified site: Secondary | ICD-10-CM | POA: Diagnosis not present

## 2023-06-19 DIAGNOSIS — T451X5A Adverse effect of antineoplastic and immunosuppressive drugs, initial encounter: Secondary | ICD-10-CM | POA: Diagnosis not present

## 2023-06-19 DIAGNOSIS — D701 Agranulocytosis secondary to cancer chemotherapy: Secondary | ICD-10-CM | POA: Diagnosis not present

## 2023-06-19 DIAGNOSIS — Z5111 Encounter for antineoplastic chemotherapy: Secondary | ICD-10-CM | POA: Diagnosis not present

## 2023-06-19 DIAGNOSIS — Z5112 Encounter for antineoplastic immunotherapy: Secondary | ICD-10-CM | POA: Diagnosis not present

## 2023-06-20 DIAGNOSIS — C8470 Anaplastic large cell lymphoma, ALK-negative, unspecified site: Secondary | ICD-10-CM | POA: Diagnosis not present

## 2023-06-20 DIAGNOSIS — D701 Agranulocytosis secondary to cancer chemotherapy: Secondary | ICD-10-CM | POA: Diagnosis not present

## 2023-06-20 DIAGNOSIS — T451X5A Adverse effect of antineoplastic and immunosuppressive drugs, initial encounter: Secondary | ICD-10-CM | POA: Diagnosis not present

## 2023-06-24 DIAGNOSIS — C3412 Malignant neoplasm of upper lobe, left bronchus or lung: Secondary | ICD-10-CM | POA: Diagnosis not present

## 2023-06-24 DIAGNOSIS — S90421D Blister (nonthermal), right great toe, subsequent encounter: Secondary | ICD-10-CM | POA: Diagnosis not present

## 2023-06-24 DIAGNOSIS — C3491 Malignant neoplasm of unspecified part of right bronchus or lung: Secondary | ICD-10-CM | POA: Diagnosis not present

## 2023-06-24 DIAGNOSIS — G893 Neoplasm related pain (acute) (chronic): Secondary | ICD-10-CM | POA: Diagnosis not present

## 2023-06-24 DIAGNOSIS — N390 Urinary tract infection, site not specified: Secondary | ICD-10-CM | POA: Diagnosis not present

## 2023-06-24 DIAGNOSIS — E782 Mixed hyperlipidemia: Secondary | ICD-10-CM | POA: Diagnosis not present

## 2023-06-24 DIAGNOSIS — C61 Malignant neoplasm of prostate: Secondary | ICD-10-CM | POA: Diagnosis not present

## 2023-06-24 DIAGNOSIS — D701 Agranulocytosis secondary to cancer chemotherapy: Secondary | ICD-10-CM | POA: Diagnosis not present

## 2023-06-24 DIAGNOSIS — C8580 Other specified types of non-Hodgkin lymphoma, unspecified site: Secondary | ICD-10-CM | POA: Diagnosis not present

## 2023-06-24 DIAGNOSIS — G894 Chronic pain syndrome: Secondary | ICD-10-CM | POA: Diagnosis not present

## 2023-06-24 DIAGNOSIS — C8461 Anaplastic large cell lymphoma, ALK-positive, lymph nodes of head, face, and neck: Secondary | ICD-10-CM | POA: Diagnosis not present

## 2023-06-24 DIAGNOSIS — Z6825 Body mass index (BMI) 25.0-25.9, adult: Secondary | ICD-10-CM | POA: Diagnosis not present

## 2023-06-24 DIAGNOSIS — T451X5D Adverse effect of antineoplastic and immunosuppressive drugs, subsequent encounter: Secondary | ICD-10-CM | POA: Diagnosis not present

## 2023-06-24 DIAGNOSIS — D630 Anemia in neoplastic disease: Secondary | ICD-10-CM | POA: Diagnosis not present

## 2023-06-30 DIAGNOSIS — J984 Other disorders of lung: Secondary | ICD-10-CM | POA: Diagnosis not present

## 2023-06-30 DIAGNOSIS — R509 Fever, unspecified: Secondary | ICD-10-CM | POA: Diagnosis not present

## 2023-06-30 DIAGNOSIS — I951 Orthostatic hypotension: Secondary | ICD-10-CM | POA: Diagnosis not present

## 2023-06-30 DIAGNOSIS — C8330 Diffuse large B-cell lymphoma, unspecified site: Secondary | ICD-10-CM | POA: Diagnosis not present

## 2023-06-30 DIAGNOSIS — I4891 Unspecified atrial fibrillation: Secondary | ICD-10-CM | POA: Diagnosis not present

## 2023-06-30 DIAGNOSIS — E860 Dehydration: Secondary | ICD-10-CM | POA: Diagnosis not present

## 2023-06-30 DIAGNOSIS — I959 Hypotension, unspecified: Secondary | ICD-10-CM | POA: Diagnosis not present

## 2023-06-30 DIAGNOSIS — D649 Anemia, unspecified: Secondary | ICD-10-CM | POA: Diagnosis not present

## 2023-06-30 DIAGNOSIS — E1122 Type 2 diabetes mellitus with diabetic chronic kidney disease: Secondary | ICD-10-CM | POA: Diagnosis not present

## 2023-06-30 DIAGNOSIS — N419 Inflammatory disease of prostate, unspecified: Secondary | ICD-10-CM | POA: Diagnosis not present

## 2023-06-30 DIAGNOSIS — M47812 Spondylosis without myelopathy or radiculopathy, cervical region: Secondary | ICD-10-CM | POA: Diagnosis not present

## 2023-06-30 DIAGNOSIS — R109 Unspecified abdominal pain: Secondary | ICD-10-CM | POA: Diagnosis not present

## 2023-06-30 DIAGNOSIS — K922 Gastrointestinal hemorrhage, unspecified: Secondary | ICD-10-CM | POA: Diagnosis not present

## 2023-06-30 DIAGNOSIS — R221 Localized swelling, mass and lump, neck: Secondary | ICD-10-CM | POA: Diagnosis not present

## 2023-06-30 DIAGNOSIS — R0902 Hypoxemia: Secondary | ICD-10-CM | POA: Diagnosis not present

## 2023-06-30 DIAGNOSIS — R531 Weakness: Secondary | ICD-10-CM | POA: Diagnosis not present

## 2023-06-30 DIAGNOSIS — Z20822 Contact with and (suspected) exposure to covid-19: Secondary | ICD-10-CM | POA: Diagnosis not present

## 2023-07-01 DIAGNOSIS — N3289 Other specified disorders of bladder: Secondary | ICD-10-CM | POA: Diagnosis not present

## 2023-07-01 DIAGNOSIS — K51211 Ulcerative (chronic) proctitis with rectal bleeding: Secondary | ICD-10-CM | POA: Diagnosis not present

## 2023-07-01 DIAGNOSIS — D638 Anemia in other chronic diseases classified elsewhere: Secondary | ICD-10-CM | POA: Diagnosis not present

## 2023-07-01 DIAGNOSIS — E1142 Type 2 diabetes mellitus with diabetic polyneuropathy: Secondary | ICD-10-CM | POA: Diagnosis not present

## 2023-07-01 DIAGNOSIS — K922 Gastrointestinal hemorrhage, unspecified: Secondary | ICD-10-CM | POA: Diagnosis not present

## 2023-07-01 DIAGNOSIS — I4891 Unspecified atrial fibrillation: Secondary | ICD-10-CM | POA: Diagnosis not present

## 2023-07-01 DIAGNOSIS — D696 Thrombocytopenia, unspecified: Secondary | ICD-10-CM | POA: Diagnosis not present

## 2023-07-01 DIAGNOSIS — R259 Unspecified abnormal involuntary movements: Secondary | ICD-10-CM | POA: Diagnosis not present

## 2023-07-01 DIAGNOSIS — R627 Adult failure to thrive: Secondary | ICD-10-CM | POA: Diagnosis not present

## 2023-07-01 DIAGNOSIS — K529 Noninfective gastroenteritis and colitis, unspecified: Secondary | ICD-10-CM | POA: Diagnosis not present

## 2023-07-01 DIAGNOSIS — C8590 Non-Hodgkin lymphoma, unspecified, unspecified site: Secondary | ICD-10-CM | POA: Diagnosis not present

## 2023-07-01 DIAGNOSIS — N281 Cyst of kidney, acquired: Secondary | ICD-10-CM | POA: Diagnosis not present

## 2023-07-01 DIAGNOSIS — Z9221 Personal history of antineoplastic chemotherapy: Secondary | ICD-10-CM | POA: Diagnosis not present

## 2023-07-01 DIAGNOSIS — K6389 Other specified diseases of intestine: Secondary | ICD-10-CM | POA: Diagnosis not present

## 2023-07-01 DIAGNOSIS — I480 Paroxysmal atrial fibrillation: Secondary | ICD-10-CM | POA: Diagnosis not present

## 2023-07-01 DIAGNOSIS — K6289 Other specified diseases of anus and rectum: Secondary | ICD-10-CM | POA: Diagnosis not present

## 2023-07-01 DIAGNOSIS — R531 Weakness: Secondary | ICD-10-CM | POA: Diagnosis not present

## 2023-07-02 DIAGNOSIS — D638 Anemia in other chronic diseases classified elsewhere: Secondary | ICD-10-CM | POA: Diagnosis not present

## 2023-07-02 DIAGNOSIS — K922 Gastrointestinal hemorrhage, unspecified: Secondary | ICD-10-CM | POA: Diagnosis not present

## 2023-07-02 DIAGNOSIS — E1142 Type 2 diabetes mellitus with diabetic polyneuropathy: Secondary | ICD-10-CM | POA: Diagnosis not present

## 2023-07-02 DIAGNOSIS — I480 Paroxysmal atrial fibrillation: Secondary | ICD-10-CM | POA: Diagnosis not present

## 2023-07-02 DIAGNOSIS — R531 Weakness: Secondary | ICD-10-CM | POA: Diagnosis not present

## 2023-07-02 DIAGNOSIS — K529 Noninfective gastroenteritis and colitis, unspecified: Secondary | ICD-10-CM | POA: Diagnosis not present

## 2023-07-02 DIAGNOSIS — C8590 Non-Hodgkin lymphoma, unspecified, unspecified site: Secondary | ICD-10-CM | POA: Diagnosis not present

## 2023-07-02 DIAGNOSIS — R627 Adult failure to thrive: Secondary | ICD-10-CM | POA: Diagnosis not present

## 2023-07-02 DIAGNOSIS — D696 Thrombocytopenia, unspecified: Secondary | ICD-10-CM | POA: Diagnosis not present

## 2023-07-02 DIAGNOSIS — Z9221 Personal history of antineoplastic chemotherapy: Secondary | ICD-10-CM | POA: Diagnosis not present

## 2023-07-02 DIAGNOSIS — I4891 Unspecified atrial fibrillation: Secondary | ICD-10-CM | POA: Diagnosis not present

## 2023-07-02 DIAGNOSIS — K51211 Ulcerative (chronic) proctitis with rectal bleeding: Secondary | ICD-10-CM | POA: Diagnosis not present

## 2023-07-03 DIAGNOSIS — R933 Abnormal findings on diagnostic imaging of other parts of digestive tract: Secondary | ICD-10-CM | POA: Diagnosis not present

## 2023-07-03 DIAGNOSIS — E1142 Type 2 diabetes mellitus with diabetic polyneuropathy: Secondary | ICD-10-CM | POA: Diagnosis not present

## 2023-07-03 DIAGNOSIS — K6289 Other specified diseases of anus and rectum: Secondary | ICD-10-CM | POA: Diagnosis not present

## 2023-07-03 DIAGNOSIS — J449 Chronic obstructive pulmonary disease, unspecified: Secondary | ICD-10-CM | POA: Diagnosis not present

## 2023-07-03 DIAGNOSIS — I10 Essential (primary) hypertension: Secondary | ICD-10-CM | POA: Diagnosis not present

## 2023-07-03 DIAGNOSIS — Z681 Body mass index (BMI) 19 or less, adult: Secondary | ICD-10-CM | POA: Diagnosis not present

## 2023-07-03 DIAGNOSIS — G9340 Encephalopathy, unspecified: Secondary | ICD-10-CM | POA: Diagnosis not present

## 2023-07-03 DIAGNOSIS — K51211 Ulcerative (chronic) proctitis with rectal bleeding: Secondary | ICD-10-CM | POA: Diagnosis not present

## 2023-07-03 DIAGNOSIS — F0390 Unspecified dementia without behavioral disturbance: Secondary | ICD-10-CM | POA: Diagnosis not present

## 2023-07-03 DIAGNOSIS — D696 Thrombocytopenia, unspecified: Secondary | ICD-10-CM | POA: Diagnosis not present

## 2023-07-03 DIAGNOSIS — E1169 Type 2 diabetes mellitus with other specified complication: Secondary | ICD-10-CM | POA: Diagnosis not present

## 2023-07-03 DIAGNOSIS — C8590 Non-Hodgkin lymphoma, unspecified, unspecified site: Secondary | ICD-10-CM | POA: Diagnosis not present

## 2023-07-03 DIAGNOSIS — R627 Adult failure to thrive: Secondary | ICD-10-CM | POA: Diagnosis not present

## 2023-07-03 DIAGNOSIS — D638 Anemia in other chronic diseases classified elsewhere: Secondary | ICD-10-CM | POA: Diagnosis not present

## 2023-07-03 DIAGNOSIS — Z7401 Bed confinement status: Secondary | ICD-10-CM | POA: Diagnosis not present

## 2023-07-03 DIAGNOSIS — R531 Weakness: Secondary | ICD-10-CM | POA: Diagnosis not present

## 2023-07-03 DIAGNOSIS — I480 Paroxysmal atrial fibrillation: Secondary | ICD-10-CM | POA: Diagnosis not present

## 2023-07-03 DIAGNOSIS — E441 Mild protein-calorie malnutrition: Secondary | ICD-10-CM | POA: Diagnosis not present

## 2023-07-06 DIAGNOSIS — G893 Neoplasm related pain (acute) (chronic): Secondary | ICD-10-CM | POA: Diagnosis not present

## 2023-07-06 DIAGNOSIS — T451X5D Adverse effect of antineoplastic and immunosuppressive drugs, subsequent encounter: Secondary | ICD-10-CM | POA: Diagnosis not present

## 2023-07-06 DIAGNOSIS — S90421D Blister (nonthermal), right great toe, subsequent encounter: Secondary | ICD-10-CM | POA: Diagnosis not present

## 2023-07-06 DIAGNOSIS — D701 Agranulocytosis secondary to cancer chemotherapy: Secondary | ICD-10-CM | POA: Diagnosis not present

## 2023-07-06 DIAGNOSIS — D630 Anemia in neoplastic disease: Secondary | ICD-10-CM | POA: Diagnosis not present

## 2023-07-06 DIAGNOSIS — C61 Malignant neoplasm of prostate: Secondary | ICD-10-CM | POA: Diagnosis not present

## 2023-07-06 DIAGNOSIS — C3491 Malignant neoplasm of unspecified part of right bronchus or lung: Secondary | ICD-10-CM | POA: Diagnosis not present

## 2023-07-06 DIAGNOSIS — C3412 Malignant neoplasm of upper lobe, left bronchus or lung: Secondary | ICD-10-CM | POA: Diagnosis not present

## 2023-07-06 DIAGNOSIS — C8461 Anaplastic large cell lymphoma, ALK-positive, lymph nodes of head, face, and neck: Secondary | ICD-10-CM | POA: Diagnosis not present

## 2023-07-08 DIAGNOSIS — C8461 Anaplastic large cell lymphoma, ALK-positive, lymph nodes of head, face, and neck: Secondary | ICD-10-CM | POA: Diagnosis not present

## 2023-07-08 DIAGNOSIS — K648 Other hemorrhoids: Secondary | ICD-10-CM | POA: Diagnosis not present

## 2023-07-08 DIAGNOSIS — C3412 Malignant neoplasm of upper lobe, left bronchus or lung: Secondary | ICD-10-CM | POA: Diagnosis not present

## 2023-07-08 DIAGNOSIS — K6289 Other specified diseases of anus and rectum: Secondary | ICD-10-CM | POA: Diagnosis not present

## 2023-07-08 DIAGNOSIS — D630 Anemia in neoplastic disease: Secondary | ICD-10-CM | POA: Diagnosis not present

## 2023-07-08 DIAGNOSIS — G893 Neoplasm related pain (acute) (chronic): Secondary | ICD-10-CM | POA: Diagnosis not present

## 2023-07-08 DIAGNOSIS — C3491 Malignant neoplasm of unspecified part of right bronchus or lung: Secondary | ICD-10-CM | POA: Diagnosis not present

## 2023-07-08 DIAGNOSIS — D61810 Antineoplastic chemotherapy induced pancytopenia: Secondary | ICD-10-CM | POA: Diagnosis not present

## 2023-07-08 DIAGNOSIS — C61 Malignant neoplasm of prostate: Secondary | ICD-10-CM | POA: Diagnosis not present

## 2023-07-09 DIAGNOSIS — C8470 Anaplastic large cell lymphoma, ALK-negative, unspecified site: Secondary | ICD-10-CM | POA: Diagnosis not present

## 2023-07-09 DIAGNOSIS — I4811 Longstanding persistent atrial fibrillation: Secondary | ICD-10-CM | POA: Diagnosis not present

## 2023-07-09 DIAGNOSIS — E876 Hypokalemia: Secondary | ICD-10-CM | POA: Diagnosis not present

## 2023-07-09 DIAGNOSIS — Z923 Personal history of irradiation: Secondary | ICD-10-CM | POA: Diagnosis not present

## 2023-07-09 DIAGNOSIS — Z8546 Personal history of malignant neoplasm of prostate: Secondary | ICD-10-CM | POA: Diagnosis not present

## 2023-07-09 DIAGNOSIS — K639 Disease of intestine, unspecified: Secondary | ICD-10-CM | POA: Diagnosis not present

## 2023-07-09 DIAGNOSIS — C3411 Malignant neoplasm of upper lobe, right bronchus or lung: Secondary | ICD-10-CM | POA: Diagnosis not present

## 2023-07-09 DIAGNOSIS — T451X5A Adverse effect of antineoplastic and immunosuppressive drugs, initial encounter: Secondary | ICD-10-CM | POA: Diagnosis not present

## 2023-07-09 DIAGNOSIS — D701 Agranulocytosis secondary to cancer chemotherapy: Secondary | ICD-10-CM | POA: Diagnosis not present

## 2023-07-10 ENCOUNTER — Telehealth: Payer: Self-pay | Admitting: Cardiology

## 2023-07-10 DIAGNOSIS — J189 Pneumonia, unspecified organism: Secondary | ICD-10-CM | POA: Diagnosis not present

## 2023-07-10 NOTE — Telephone Encounter (Signed)
 Pt c/o medication issue:  1. Name of Medication:  Amiodarone  2. How are you currently taking this medication (dosage and times per day)?   3. Are you having a reaction (difficulty breathing--STAT)?   4. What is your medication issue?   Caller Ship broker) stated patient was discharged from the hospital with this medication but patient does not have this medication in the home.  Caller wants to know if patient should be taking this medication.

## 2023-07-11 NOTE — Telephone Encounter (Signed)
 Patient HH  informed and verbalized understanding of plan.

## 2023-07-15 DIAGNOSIS — D61810 Antineoplastic chemotherapy induced pancytopenia: Secondary | ICD-10-CM | POA: Diagnosis not present

## 2023-07-15 DIAGNOSIS — G893 Neoplasm related pain (acute) (chronic): Secondary | ICD-10-CM | POA: Diagnosis not present

## 2023-07-15 DIAGNOSIS — D630 Anemia in neoplastic disease: Secondary | ICD-10-CM | POA: Diagnosis not present

## 2023-07-15 DIAGNOSIS — C61 Malignant neoplasm of prostate: Secondary | ICD-10-CM | POA: Diagnosis not present

## 2023-07-15 DIAGNOSIS — C3412 Malignant neoplasm of upper lobe, left bronchus or lung: Secondary | ICD-10-CM | POA: Diagnosis not present

## 2023-07-15 DIAGNOSIS — C3491 Malignant neoplasm of unspecified part of right bronchus or lung: Secondary | ICD-10-CM | POA: Diagnosis not present

## 2023-07-15 DIAGNOSIS — K648 Other hemorrhoids: Secondary | ICD-10-CM | POA: Diagnosis not present

## 2023-07-15 DIAGNOSIS — C8461 Anaplastic large cell lymphoma, ALK-positive, lymph nodes of head, face, and neck: Secondary | ICD-10-CM | POA: Diagnosis not present

## 2023-07-15 DIAGNOSIS — K6289 Other specified diseases of anus and rectum: Secondary | ICD-10-CM | POA: Diagnosis not present

## 2023-07-16 DIAGNOSIS — N281 Cyst of kidney, acquired: Secondary | ICD-10-CM | POA: Diagnosis not present

## 2023-07-16 DIAGNOSIS — D701 Agranulocytosis secondary to cancer chemotherapy: Secondary | ICD-10-CM | POA: Diagnosis not present

## 2023-07-16 DIAGNOSIS — R972 Elevated prostate specific antigen [PSA]: Secondary | ICD-10-CM | POA: Diagnosis not present

## 2023-07-16 DIAGNOSIS — Z79899 Other long term (current) drug therapy: Secondary | ICD-10-CM | POA: Diagnosis not present

## 2023-07-16 DIAGNOSIS — Z5181 Encounter for therapeutic drug level monitoring: Secondary | ICD-10-CM | POA: Diagnosis not present

## 2023-07-16 DIAGNOSIS — I7143 Infrarenal abdominal aortic aneurysm, without rupture: Secondary | ICD-10-CM | POA: Diagnosis not present

## 2023-07-16 DIAGNOSIS — R948 Abnormal results of function studies of other organs and systems: Secondary | ICD-10-CM | POA: Diagnosis not present

## 2023-07-16 DIAGNOSIS — T451X5A Adverse effect of antineoplastic and immunosuppressive drugs, initial encounter: Secondary | ICD-10-CM | POA: Diagnosis not present

## 2023-07-16 DIAGNOSIS — I700 Atherosclerosis of aorta: Secondary | ICD-10-CM | POA: Diagnosis not present

## 2023-07-16 DIAGNOSIS — R591 Generalized enlarged lymph nodes: Secondary | ICD-10-CM | POA: Diagnosis not present

## 2023-07-16 DIAGNOSIS — C3411 Malignant neoplasm of upper lobe, right bronchus or lung: Secondary | ICD-10-CM | POA: Diagnosis not present

## 2023-07-16 DIAGNOSIS — C8470 Anaplastic large cell lymphoma, ALK-negative, unspecified site: Secondary | ICD-10-CM | POA: Diagnosis not present

## 2023-07-17 DIAGNOSIS — C8470 Anaplastic large cell lymphoma, ALK-negative, unspecified site: Secondary | ICD-10-CM | POA: Diagnosis not present

## 2023-07-17 DIAGNOSIS — R197 Diarrhea, unspecified: Secondary | ICD-10-CM | POA: Diagnosis not present

## 2023-07-17 DIAGNOSIS — T451X5A Adverse effect of antineoplastic and immunosuppressive drugs, initial encounter: Secondary | ICD-10-CM | POA: Diagnosis not present

## 2023-07-17 DIAGNOSIS — D701 Agranulocytosis secondary to cancer chemotherapy: Secondary | ICD-10-CM | POA: Diagnosis not present

## 2023-07-17 DIAGNOSIS — Z95828 Presence of other vascular implants and grafts: Secondary | ICD-10-CM | POA: Diagnosis not present

## 2023-07-19 DIAGNOSIS — D630 Anemia in neoplastic disease: Secondary | ICD-10-CM | POA: Diagnosis not present

## 2023-07-19 DIAGNOSIS — K6289 Other specified diseases of anus and rectum: Secondary | ICD-10-CM | POA: Diagnosis not present

## 2023-07-19 DIAGNOSIS — C3412 Malignant neoplasm of upper lobe, left bronchus or lung: Secondary | ICD-10-CM | POA: Diagnosis not present

## 2023-07-19 DIAGNOSIS — C3491 Malignant neoplasm of unspecified part of right bronchus or lung: Secondary | ICD-10-CM | POA: Diagnosis not present

## 2023-07-19 DIAGNOSIS — C8461 Anaplastic large cell lymphoma, ALK-positive, lymph nodes of head, face, and neck: Secondary | ICD-10-CM | POA: Diagnosis not present

## 2023-07-19 DIAGNOSIS — D61810 Antineoplastic chemotherapy induced pancytopenia: Secondary | ICD-10-CM | POA: Diagnosis not present

## 2023-07-19 DIAGNOSIS — C61 Malignant neoplasm of prostate: Secondary | ICD-10-CM | POA: Diagnosis not present

## 2023-07-19 DIAGNOSIS — K648 Other hemorrhoids: Secondary | ICD-10-CM | POA: Diagnosis not present

## 2023-07-19 DIAGNOSIS — G893 Neoplasm related pain (acute) (chronic): Secondary | ICD-10-CM | POA: Diagnosis not present

## 2023-07-22 DIAGNOSIS — K6289 Other specified diseases of anus and rectum: Secondary | ICD-10-CM | POA: Diagnosis not present

## 2023-07-22 DIAGNOSIS — C3412 Malignant neoplasm of upper lobe, left bronchus or lung: Secondary | ICD-10-CM | POA: Diagnosis not present

## 2023-07-22 DIAGNOSIS — C8461 Anaplastic large cell lymphoma, ALK-positive, lymph nodes of head, face, and neck: Secondary | ICD-10-CM | POA: Diagnosis not present

## 2023-07-22 DIAGNOSIS — C3491 Malignant neoplasm of unspecified part of right bronchus or lung: Secondary | ICD-10-CM | POA: Diagnosis not present

## 2023-07-22 DIAGNOSIS — D61810 Antineoplastic chemotherapy induced pancytopenia: Secondary | ICD-10-CM | POA: Diagnosis not present

## 2023-07-22 DIAGNOSIS — D630 Anemia in neoplastic disease: Secondary | ICD-10-CM | POA: Diagnosis not present

## 2023-07-22 DIAGNOSIS — C61 Malignant neoplasm of prostate: Secondary | ICD-10-CM | POA: Diagnosis not present

## 2023-07-22 DIAGNOSIS — K648 Other hemorrhoids: Secondary | ICD-10-CM | POA: Diagnosis not present

## 2023-07-22 DIAGNOSIS — G893 Neoplasm related pain (acute) (chronic): Secondary | ICD-10-CM | POA: Diagnosis not present

## 2023-07-23 DIAGNOSIS — D701 Agranulocytosis secondary to cancer chemotherapy: Secondary | ICD-10-CM | POA: Diagnosis not present

## 2023-07-23 DIAGNOSIS — C8470 Anaplastic large cell lymphoma, ALK-negative, unspecified site: Secondary | ICD-10-CM | POA: Diagnosis not present

## 2023-07-23 DIAGNOSIS — T451X5A Adverse effect of antineoplastic and immunosuppressive drugs, initial encounter: Secondary | ICD-10-CM | POA: Diagnosis not present

## 2023-07-24 DIAGNOSIS — C8470 Anaplastic large cell lymphoma, ALK-negative, unspecified site: Secondary | ICD-10-CM | POA: Diagnosis not present

## 2023-07-24 DIAGNOSIS — D701 Agranulocytosis secondary to cancer chemotherapy: Secondary | ICD-10-CM | POA: Diagnosis not present

## 2023-07-24 DIAGNOSIS — Z5112 Encounter for antineoplastic immunotherapy: Secondary | ICD-10-CM | POA: Diagnosis not present

## 2023-07-24 DIAGNOSIS — T451X5A Adverse effect of antineoplastic and immunosuppressive drugs, initial encounter: Secondary | ICD-10-CM | POA: Diagnosis not present

## 2023-07-24 DIAGNOSIS — Z5111 Encounter for antineoplastic chemotherapy: Secondary | ICD-10-CM | POA: Diagnosis not present

## 2023-07-25 ENCOUNTER — Ambulatory Visit: Payer: Medicare PPO | Admitting: Urology

## 2023-07-25 ENCOUNTER — Ambulatory Visit (INDEPENDENT_AMBULATORY_CARE_PROVIDER_SITE_OTHER): Admitting: Urology

## 2023-07-25 ENCOUNTER — Ambulatory Visit: Admitting: Urology

## 2023-07-25 VITALS — BP 108/67 | HR 114

## 2023-07-25 DIAGNOSIS — T451X5A Adverse effect of antineoplastic and immunosuppressive drugs, initial encounter: Secondary | ICD-10-CM | POA: Diagnosis not present

## 2023-07-25 DIAGNOSIS — R300 Dysuria: Secondary | ICD-10-CM

## 2023-07-25 DIAGNOSIS — C8470 Anaplastic large cell lymphoma, ALK-negative, unspecified site: Secondary | ICD-10-CM | POA: Diagnosis not present

## 2023-07-25 DIAGNOSIS — Z87440 Personal history of urinary (tract) infections: Secondary | ICD-10-CM | POA: Diagnosis not present

## 2023-07-25 DIAGNOSIS — Z8546 Personal history of malignant neoplasm of prostate: Secondary | ICD-10-CM | POA: Diagnosis not present

## 2023-07-25 DIAGNOSIS — N390 Urinary tract infection, site not specified: Secondary | ICD-10-CM

## 2023-07-25 DIAGNOSIS — R339 Retention of urine, unspecified: Secondary | ICD-10-CM

## 2023-07-25 DIAGNOSIS — D701 Agranulocytosis secondary to cancer chemotherapy: Secondary | ICD-10-CM | POA: Diagnosis not present

## 2023-07-25 NOTE — Progress Notes (Signed)
 Subjective:  1. Urinary retention   2. History of prostate cancer   3. Recurrent UTI   4. Dysuria    07/25/23: Jadyel returns today in f/u.  He had an admission at [facility] on 06/30/23 for a GI bleed and had a foley placed for retention and a failed voiding trial.  He was taking off of tamsulosin  and changed to Cardura.   HIs PSA was 0.08 on 12/28/22 but I don't have one since. He completed EXRT in 9/19 and ADT with last lupron  in 1/21.   HIs last testosterone  was 413 in 4/24.   He had a CT at [facility] that showed renal cysts.  Those were simple on CT in 10/24.   He has been taken off of the Eliquis .  He is currently being treated for non-Hodgkins lymphoma in [place].  He has lost a lot of weight. His Cr was 1.31 on 07/23/23.  His Hgb is 10.1.   01/24/23: Clerence returns today in f/u.  He was recently hospitalized and is to have a lymph node biopsy on 11/3 for a possible lymphoma after the finding of atypical lymphocytes found on a CT with an iliac lucent lesion on 12/26/22. Aaron Aas  He has persistent incontinence and is wear to diapers at night. His PSA remains low at 0.08 on 12/28/22.  He is having recurrent UTI's and had a culture with his PCP.  He has a script for bactrim  waiting at the pharmacy. He remains on tamsulosin  and was getting PTNS but he has missed a couple of treatment.  He remains on sildenafil .      07/12/22: Kizer returns today in f/u for his history of prostate cancer treated with EXRT and ADT as noted below.  His PSA is <0.1 on 07/03/22. He fell in March and is rehabing his right ankle.  He has a history of BOO and OAB wet.  He continues with PTNS maintenance therapy.  He remains on tamsulosin .  He is using light pads for his incontinence.  He has had UTI's and his UA is ok today.  He has ED and has success with sildenafil .  His UA is unremarkable.    02/01/22: Odus returns today to assess his response to the bactrim  he was given for a Citrobacter UTI on 01/18/22.  He had some initial benefit but the  dysuria has recurred.  He took his last dose last night.  His UA is clear today.   He remains on tamsulosin .  01/18/22: Quirino returns today in f/u for the history below.  His PSA remains <0.1 with a T of 313.  He has chronic incontinence and had been on PTNS but that has been held.  He is having some enuresis.  He is off of  Gemtesa  and Trospium  after presenting with retention on 12/05/21 but passed a voiding trial on 12/19/21.  His PVR is 17ml today.   He is having some initial dysuria that is chronic.  He was started on Tamsulosin  for the retention.  He off of daily tadalafil  but is having success on Viagra  for ED.  He is on oxycodone  for chronic pain.    10/12/21: Dimitric is here today for PTNS.  His PSA remains <0.1.  He completed EXRT with ADT in 9/19 with the last lupron  in 1/21.  His testosterone  is up to 313.  His UA is clear.  He has incontinence and wears guards.  His IPSS is 25.  He has nocturia x 3.  He is using the  Gemtesa  and trospium .   He continues to use Viagra  for the ED.   He had a PET scan on 7/12 for f/u of his lung cancer.   There was no evidence of recurrent disease.   06/22/21: Jahson returns today in f/u. He has a history of T3b N0 M0 Gleason 9 prostate cancer that was treated with EXRT and ADT.   He completed EXRT in 9/19.   His final  Lupron  injection was given 1/21. His PSA was up to 0.6 in November from an undetectible level.  He has no bone pain or weight loss.  His testosterone  level was 310 which was up from 286 in May.   He had a lumbar laminectomy in January and he is recovering from that with improvement in his symptoms.   He continues to have OAB wet and is using depends about 4 daily.   He has been on the PTNS maintenance therapy.  He is out of Gemtesa  and his symptoms have worsened with increased nocturia.   He has ED and is using Viagra .  He is not using the injections.  He has had no hematuria or dysuria.   His IPSS is 24.    02/09/21: Rasool returns today in f/u.  His  PSA was <0.1 on 07/28/20.  He didn't get labs prior to the visit.  He has OAB wet and is has been on PTNS maintenance and Gemtesa  samples..  He has frequent small voids.  He has nocturia x 3. UA is clear today.   He isn't having accidents. He has some dysuria but no hematuria.   He is getting PTNS today.   He has tadalafil  and viagra  that he uses prn.  He was given prostin  and started using it at home and has had success, but he has been given the med by the [place] and he says the needles are too big.  He does have success with the viagra .                       ROS:  ROS:  A complete review of systems was performed.  All systems are negative except for pertinent findings as noted.   Review of Systems  Constitutional:  Positive for malaise/fatigue.  Eyes:  Positive for blurred vision and double vision.  Respiratory:  Positive for shortness of breath.   Musculoskeletal:  Positive for back pain and joint pain.  Skin:  Positive for itching.  Neurological:  Positive for weakness.  Psychiatric/Behavioral:  Positive for depression and memory loss.     No Active Allergies   Outpatient Encounter Medications as of 07/25/2023  Medication Sig Note   albuterol  (PROVENTIL ) (2.5 MG/3ML) 0.083% nebulizer solution Take 3 mLs (2.5 mg total) by nebulization every 6 (six) hours as needed for shortness of breath.    atorvastatin  (LIPITOR) 20 MG tablet Take 20 mg by mouth daily. 07/26/2023: No longer taking   diclofenac Sodium (VOLTAREN) 1 % GEL Apply 2 g topically 4 (four) times daily as needed (pain).    Docusate Sodium  (DSS) 100 MG CAPS Take 100 mg by mouth daily.    donepezil  (ARICEPT ) 10 MG tablet Take 10 mg by mouth daily.    escitalopram  (LEXAPRO ) 5 MG tablet Take 5 mg by mouth daily.    feeding supplement (ENSURE ENLIVE / ENSURE PLUS) LIQD Take 237 mLs by mouth 2 (two) times daily between meals.    FREESTYLE LITE test strip     gabapentin  (NEURONTIN ) 800  MG tablet Take 800 mg by mouth daily.     Incontinence Supply Disposable ([employer] MENS GUARD) MISC 1 Pad by Does not apply route 2 (two) times daily.    Incontinence Supply Disposable (DEPEND REAL FIT/BRIEF/MEN/L-XL) MISC 1 Pad by Does not apply route in the morning and at bedtime.    iron  polysaccharides (NIFEREX) 150 MG capsule Take 1 capsule (150 mg total) by mouth daily.    Lancets (FREESTYLE) lancets     latanoprost  (XALATAN ) 0.005 % ophthalmic solution Place 1 drop into both eyes at bedtime.    LINZESS  72 MCG capsule Take 72 mcg by mouth daily before breakfast. 07/26/2023: Not taking   meclizine  (ANTIVERT ) 12.5 MG tablet Take 6.25-12.5 mg by mouth every 6 (six) hours as needed for dizziness. 07/26/2023: Not using   methocarbamol  (ROBAXIN ) 500 MG tablet Take 1 tablet (500 mg total) by mouth 2 (two) times daily as needed for muscle spasms.    metoCLOPramide  (REGLAN ) 5 MG tablet Take 5 mg by mouth daily as needed for vomiting or nausea.    mupirocin  cream (BACTROBAN ) 2 % Apply 1 application topically 2 (two) times daily as needed (wound care).    Oxycodone  HCl 20 MG TABS Take 5 mg by mouth See admin instructions. Takes 1/4 of a pill 4 times a day    polyethylene glycol (MIRALAX  / GLYCOLAX ) 17 g packet Take 17 g by mouth daily as needed for mild constipation.    potassium chloride  SA (KLOR-CON  M) 20 MEQ tablet Take 1 tablet (20 mEq total) by mouth 2 (two) times daily.    predniSONE  (DELTASONE ) 20 MG tablet Take 20 mg by mouth daily as needed (unknown).    psyllium (METAMUCIL SMOOTH TEXTURE) 58.6 % powder Take 1 packet by mouth daily.    ROCKLATAN  0.02-0.005 % SOLN Place 1 drop into both eyes at bedtime.    scopolamine (TRANSDERM-SCOP) 1 MG/3DAYS 1 patch every 3 (three) days.    sildenafil  (VIAGRA ) 100 MG tablet Take 1 tablet (100 mg total) by mouth daily as needed for erectile dysfunction.    SPIRIVA  HANDIHALER 18 MCG inhalation capsule Place 18 mcg into inhaler and inhale as needed.    [DISCONTINUED] acetaminophen  (TYLENOL ) 325 MG tablet Take  2 tablets (650 mg total) by mouth every 6 (six) hours as needed for mild pain (or Fever >/= 101). (Patient not taking: Reported on 07/25/2023)    [DISCONTINUED] apixaban  (ELIQUIS ) 5 MG TABS tablet Take 1 tablet (5 mg total) by mouth 2 (two) times daily. (Patient not taking: Reported on 07/25/2023)    [DISCONTINUED] famotidine  (PEPCID ) 20 MG tablet Take by mouth. (Patient not taking: Reported on 07/25/2023)    [DISCONTINUED] folic acid  (FOLVITE ) 1 MG tablet Take 1 tablet (1 mg total) by mouth daily. (Patient not taking: Reported on 07/25/2023)    [DISCONTINUED] metoprolol  tartrate (LOPRESSOR ) 25 MG tablet Take 1 tablet (25 mg total) by mouth 2 (two) times daily. (Patient not taking: Reported on 07/25/2023)    [DISCONTINUED] ondansetron  (ZOFRAN ) 4 MG tablet Take 1 tablet (4 mg total) by mouth every 6 (six) hours as needed for nausea. (Patient not taking: Reported on 07/25/2023)    [DISCONTINUED] pantoprazole  (PROTONIX ) 40 MG tablet Take 40 mg by mouth daily. (Patient not taking: Reported on 07/25/2023)    [DISCONTINUED] Simethicone 180 MG CAPS Take by mouth as needed. (Patient not taking: Reported on 07/25/2023)    [DISCONTINUED] sucralfate  (CARAFATE ) 1 GM/10ML suspension Take 1 g by mouth 2 (two) times daily. (Patient not taking: Reported  on 07/25/2023)    [DISCONTINUED] tamsulosin  (FLOMAX ) 0.4 MG CAPS capsule Take 1 capsule (0.4 mg total) by mouth daily. (Patient not taking: Reported on 07/25/2023)    [DISCONTINUED] timolol  (TIMOPTIC ) 0.5 % ophthalmic solution Place 1 drop into both eyes 2 (two) times daily as needed (eye pressure). (Patient not taking: Reported on 07/25/2023)    No facility-administered encounter medications on file as of 07/25/2023.    Past Medical History:  Diagnosis Date   Acquired bladder diverticulum    12/ 2012  resection diverticulum done at [facility] in [place], [name]   Age-related cataract of right eye    scheduled for catarat extraction 09-12-2017   Agent orange exposure    Aneurysm of infrarenal  abdominal aorta (HCC)    first dx 2017--- last CT 06-11-2017  measures 3.5cm   Anxiety    Chronic constipation    COPD with emphysema (HCC)    06--12-2017  per pt no symptoms since Feb 2019   Depression    PTSD   Diabetes mellitus type 2, diet-controlled (HCC)    Diabetic neuropathy (HCC)    Dyslipidemia    Erectile dysfunction    Feeling of incomplete bladder emptying    Gait abnormality 01/06/2020   GERD (gastroesophageal reflux disease)    Gout    09-02-2017 last flare up 3 months ago   Hiatal hernia    History of atrial fibrillation    episode post op right lung lobectomy 07-04-2015   History of bladder stone    History of colon polyps    History of DVT of lower extremity    03/ 2019  bilateral lower extremity (superficial) ---  per treated w/ oral medication    History of gastritis    History of kidney stones    History of radiation therapy 09-14-2015  to 09-21-2015   left upper lung nodule -- 54Gy in 3 fractions (18 Gy per fraction)   Hyperplasia of prostate with lower urinary tract symptoms (LUTS)    Hypertension    Lumbar spinal stenosis    Nephrolithiasis    Neurogenic bladder    Osteoarthritis    ankle, hands   Osteoporosis    Prostate cancer [facility]) urologist-  dr Travante Knee/  oncologist-  dr Lorri Rota   dx 05-24-2017-- Stage T2b,  Gleason 4+4,  PSA 22.41,  vol 38cc--- started ADT 04/ 2019,  plan external radiation therapy   Radiation fibrosis of lung (HCC)    hx left upper long nodule SBRT 06/ 2017   Squamous cell carcinoma of both lungs (HCC) last CT in epic dated 06-26-2017 no recurrence   dx 03/ 2017  non-small cell SCC via bronchoscopy w/ bx's by dr Luna Salinas---  s/p  right VATS w/ right lobectomy and node dissection's 07-04-2015 /  pt had SBRT to left upper nodule Stage 1 ([facility]) completed 09-21-2015   Vertigo 2018   Wears dentures    bottom   Wears glasses     Past Surgical History:  Procedure Laterality Date   BIOPSY  05/29/2019   Procedure:  BIOPSY;  Surgeon: Ruby Corporal, MD;  [facility];  Service: Endoscopy;;  gastric ulcer   CARPAL TUNNEL RELEASE Right 07/09/2014   Procedure: RIGHT OPEN CARPAL TUNNEL RELEASE;  Surgeon: Arnie Lao, MD;  Location: [facility];  Service: Orthopedics;  Laterality: Right;   CHOLECYSTECTOMY N/A 02/24/2014   Procedure: LAPAROSCOPIC CHOLECYSTECTOMY;  Surgeon: Oza Blumenthal, MD;  Location: [facility];  Service: General;  Laterality:  N/A;   COLONOSCOPY     CYST[facility]ITHOT[facility]  [facility] [place]   CYSTOSCOPY N/A 04/18/2018   Procedure: CYSTOSCOPY FLEXIBLE;  Surgeon: Homero Luster, MD;  Location: [facility];  Service: Urology;  Laterality: N/A;   CYSTOSCOPY W/ LITHOLAPAXY / EHL  02-24-2007   dr Levi Real  [facility]   dental implant     No teeth at this time waiting for them to be made as of 01-30-19   ESOPHAGOGASTRODUODENOSCOPY (EGD) WITH PROPOFOL  N/A 05/29/2019   Procedure: ESOPHAGOGASTRODUODENOSCOPY (EGD) WITH PROPOFOL ;  Surgeon: Ruby Corporal, MD;  [facility];  Service: Endoscopy;  Laterality: N/A;  925   GOLD SEED IMPLANT N/A 09/05/2017   Procedure: GOLD SEED IMPLANT;  Surgeon: Homero Luster, MD;  Location[facility];  Service: Urology;  Laterality: N/A;   INSERTION OF SUPRAPUBIC CATHETER N/A 04/18/2018   Procedure: SUPRAPUBIC TUBE CHANGE;  Surgeon: Homero Luster, MD;  Location: [facility];  Service: Urology;  Laterality: N/A;   IR CATHETER TUBE CHANGE  02/24/2018   LUMBAR LAMINECTOMY/DECOMPRESSION MICRODISCECTOMY Right 03/31/2021   Procedure: Bilateral Lumbar Three- Four, Lumbar Four-Five Laminectomy, Decompression;  Surgeon: Audie Bleacher, MD;  Location: [facility];  Service: Neurosurgery;  Laterality: Right;   SPACE OAR INSTILLATION N/A 09/05/2017   Procedure: SPACE OAR INSTILLATION;  Surgeon: Homero Luster, MD;  Location[facility];  Service: Urology;  Laterality: N/A;   TOTAL ANKLE ARTHROPLASTY Right 04/20/2015   Procedure: TOTAL ANKLE  ARTHOPLASTY;  Surgeon: Timothy Ford, MD;  Location: [facility];  Service: Orthopedics;  Laterality: Right;   TRANSTHORACIC ECHOCARDIOGRAM  11/16/2012   ef 55-60%,  grade 1 diastolic dysfunction/  mild dilated ascending aorta/  mild MR/ trivial TR   TRANSURETHRAL RESECTION OF PROSTATE N/A 02/03/2019   Procedure: TRANSURETHRAL RESECTION OF THE PROSTATE (TURP);  Surgeon: Homero Luster, MD;  [facility];  Service: Urology;  Laterality: N/A;   VIDEO ASSISTED THORACOSCOPY (VATS)/ LOBECTOMY Right 07/04/2015   Procedure: VIDEO ASSISTED THORACOSCOPY (VATS)/ RIGHT UPPER LOBECTOMY;  Surgeon: Zelphia Higashi, MD;  Location[facility];  Service: Thoracic;  Laterality: Right;   VIDEO BRONCHOSCOPY N/A 06/17/2015   Procedure: VIDEO BRONCHOSCOPY;  Surgeon: Zelphia Higashi, MD;  Location: [facility] OR;  Service: Thoracic;  Laterality: N/A;   VIDEO BRONCHOSCOPY WITH ENDOBRONCHIAL NAVIGATION N/A 06/17/2015   Procedure: VIDEO BRONCHOSCOPY WITH ENDOBRONCHIAL NAVIGATION;  Surgeon: Zelphia Higashi, MD;  [facility];  Service: Thoracic;  Laterality: N/A;   VIDEO BRONCHOSCOPY WITH ENDOBRONCHIAL ULTRASOUND N/A 06/17/2015   Procedure: VIDEO BRONCHOSCOPY WITH ENDOBRONCHIAL ULTRASOUND;  Surgeon: Zelphia Higashi, MD;  Location[facility];  Service: Thoracic;  Laterality: N/A;    Social History   Socioeconomic History   Marital status: Married    Spouse name: Moise Anes   Number of children: 2   Years of education: college   Highest education level: Not on file  Occupational History   Occupation: retired  Tobacco Use   Smoking status: Former    Current packs/day: 0.00    Average packs/day: 0.5 packs/day for 40.0 years (20.0 ttl pk-yrs)    Types: Cigarettes    Start date: 02/13/1975    Quit date: 02/13/2015    Years since quitting: 8.4   Smokeless tobacco: Never  Vaping Use   Vaping status: Never Used  Substance and Sexual Activity   Alcohol  use: No    Alcohol /week: 0.0 standard drinks of alcohol    Drug  use: No  Sexual activity: Not Currently  Other Topics Concern   Not on file  Social History Narrative   Lives with wife, daughter and son in law temporarily to help them out   Right Handed   Drinks caffeine occassionally   Social Drivers of Health   Financial Resource Strain: Low Risk  (06/07/2023)   [facility]   Overall Financial Resource Strain (CARDIA)    Difficulty of Paying Living Expenses: Not hard at all  Food Insecurity: No Food Insecurity (07/01/2023)   Received from [facility]   Hunger Vital Sign    Worried About Running Out of Food in the Last Year: Never true    Ran Out of Food in the Last Year: Never true  Transportation Needs: No Transportation Needs (07/02/2023)   Received from [facility] - Transportation    Lack of Transportation (Medical): No    Lack of Transportation (Non-Medical): No  Physical Activity: Insufficiently Active (11/04/2022)   Received from [facility]   Exercise Vital Sign    Days of Exercise per Week: 3 days    Minutes of Exercise per Session: 30 min  Stress: No Stress Concern Present (07/01/2023)   Received from [facility] - Occupational Stress Questionnaire    Feeling of Stress : Not at all  Social Connections: Socially Integrated (11/04/2022)   Received from [facility]   Social Connection and Isolation Panel [NHANES]    Frequency of Communication with Friends and Family: More than three times a week    Frequency of Social Gatherings with Friends and Family: More than three times a week    Attends Religious Services: More than 4 times per year    Active Member of [employer] or Organizations: Yes    Attends Banker Meetings: 1 to 4 times per year    Marital Status: Married  Catering manager Violence: Not At Risk (07/09/2023)   Received from [facility]   Humiliation, Afraid, Rape, and Kick questionnaire    Fear of Current or Ex-Partner: No     Emotionally Abused: No    Physically Abused: No    Sexually Abused: No    Family History  Problem Relation Age of Onset   Cancer Father        Asbestos   Colon cancer Neg Hx    Colon polyps Neg Hx    Kidney disease Neg Hx    Esophageal cancer Neg Hx    Gallbladder disease Neg Hx    Heart disease Neg Hx    Diabetes Neg Hx        Objective: Vitals:   07/25/23 1102  BP: 108/67  Pulse: (!) 114      Physical Exam Vitals reviewed.  Constitutional:      Appearance: Normal appearance.  Neurological:     Mental Status: He is alert.     Lab Results:  No results found for this or any previous visit (from the past 24 hours).  UA is clear.    BMET No results for input(s): "NA", "K", "CL", "CO2", "GLUCOSE", "BUN", "CREATININE", "CALCIUM " in the last 72 hours. PSA Lab Results  Component Value Date   PSA1 <0.1 07/03/2022   PSA1 <0.1 01/12/2022   PSA1 <0.1 09/07/2021    PSA  Date Value Ref Range Status  08/21/2019 <0.1 < OR = 4.0 ng/mL Final    Comment:    The total PSA value from  this assay system is  standardized against the WHO standard. The test  result will be approximately 20% lower when compared  to the equimolar-standardized total PSA (Beckman  Coulter). Comparison of serial PSA results should be  interpreted with this fact in mind. . This test was performed using the Siemens  chemiluminescent method. Values obtained from  different assay methods cannot be used interchangeably. PSA levels, regardless of value, should not be interpreted as absolute evidence of the presence or absence of disease.   04/06/2019 <0.1 < OR = 4.0 ng/mL Final    Comment:    The total PSA value from this assay system is  standardized against the WHO standard. The test  result will be approximately 20% lower when compared  to the equimolar-standardized total PSA (Beckman  Coulter). Comparison of serial PSA results should be  interpreted with this fact in mind. . This test  was performed using the Siemens  chemiluminescent method. Values obtained from  different assay methods cannot be used interchangeably. PSA levels, regardless of value, should not be interpreted as absolute evidence of the presence or absence of disease.   10/26/2008 0.69 0.10 - 4.00 ng/mL Final    Comment:    See lab report for associated comment(s)   Testosterone   Date Value Ref Range Status  07/03/2022 413 264 - 916 ng/dL Final    Comment:    Adult male reference interval is based on a population of healthy nonobese males (BMI <30) between 62 and 24 years old. Travison, et.al. JCEM [phone]. PMID: 91478295.   01/12/2022 313 264 - 916 ng/dL Final    Comment:    Adult male reference interval is based on a population of healthy nonobese males (BMI <30) between 60 and 77 years old. Travison, et.al. JCEM [phone]. PMID: 95284132.   09/07/2021 313 264 - 916 ng/dL Final    Comment:    Adult male reference interval is based on a population of healthy nonobese males (BMI <30) between 14 and 87 years old. Travison, et.al. JCEM [phone]. PMID: 34742595.     PSA was 0.08 at his recent hospitalization.  UA is unremarkable today.    Studies/Results:   Assessment & Plan: History of Prostate cancer.   His  PSA remains is very low at 0.08.  OAB with incontinence with an elevated PVR.  He was in retention during his hospitalization and has a foley with some discomfort.   I discussed a voiding trial vs an SP tube.  He wants to be set up for the SP tube insertion.   Recurrent UTI's.  I will get a culture with the new catheter today.   ED.  He has been on viagra  but has a foley now.    No orders of the defined types were placed in this encounter.    Orders Placed This Encounter  Procedures   Urine Culture   IR Guided Drain W Catheter Placement    He has pain with the foley and needs an SP tube placed as soon as possible.    Standing Status:    Future    Number of Occurrences:   1    Expected Date:   07/26/2023    Expiration Date:   10/25/2023    Reason for Exam (SYMPTOM  OR DIAGNOSIS REQUIRED):   Urinary retention    Preferred Imaging Location?:   [facility]   Insert,temp indwelling blad cath,simple        Return in about 1 month (around  08/25/2023) for any available provider.   CC: Fredick Jarred, PA-C      Homero Luster 07/26/2023 Patient ID: Ebony Goldstein, male   DOB: 05-30-1940, 83 y.o.   MRN: [id number]

## 2023-07-26 ENCOUNTER — Other Ambulatory Visit: Payer: Self-pay | Admitting: Urology

## 2023-07-26 ENCOUNTER — Ambulatory Visit (HOSPITAL_COMMUNITY)

## 2023-07-26 ENCOUNTER — Other Ambulatory Visit: Payer: Self-pay

## 2023-07-26 ENCOUNTER — Encounter: Payer: Self-pay | Admitting: Urology

## 2023-07-26 ENCOUNTER — Ambulatory Visit (HOSPITAL_COMMUNITY)
Admission: RE | Admit: 2023-07-26 | Discharge: 2023-07-26 | Disposition: A | Discharge status: Home / Self Care | Source: Ambulatory Visit | Attending: Urology | Admitting: Urology

## 2023-07-26 ENCOUNTER — Other Ambulatory Visit: Payer: Self-pay | Admitting: Radiology

## 2023-07-26 ENCOUNTER — Encounter (HOSPITAL_COMMUNITY): Payer: Self-pay

## 2023-07-26 DIAGNOSIS — R339 Retention of urine, unspecified: Secondary | ICD-10-CM

## 2023-07-26 DIAGNOSIS — Z87891 Personal history of nicotine dependence: Secondary | ICD-10-CM | POA: Insufficient documentation

## 2023-07-26 DIAGNOSIS — E785 Hyperlipidemia, unspecified: Secondary | ICD-10-CM | POA: Insufficient documentation

## 2023-07-26 DIAGNOSIS — N401 Enlarged prostate with lower urinary tract symptoms: Secondary | ICD-10-CM | POA: Diagnosis not present

## 2023-07-26 DIAGNOSIS — F32A Depression, unspecified: Secondary | ICD-10-CM | POA: Diagnosis not present

## 2023-07-26 DIAGNOSIS — J449 Chronic obstructive pulmonary disease, unspecified: Secondary | ICD-10-CM | POA: Diagnosis not present

## 2023-07-26 DIAGNOSIS — R300 Dysuria: Secondary | ICD-10-CM

## 2023-07-26 DIAGNOSIS — C8590 Non-Hodgkin lymphoma, unspecified, unspecified site: Secondary | ICD-10-CM | POA: Insufficient documentation

## 2023-07-26 DIAGNOSIS — N390 Urinary tract infection, site not specified: Secondary | ICD-10-CM

## 2023-07-26 DIAGNOSIS — C61 Malignant neoplasm of prostate: Secondary | ICD-10-CM | POA: Diagnosis not present

## 2023-07-26 DIAGNOSIS — K219 Gastro-esophageal reflux disease without esophagitis: Secondary | ICD-10-CM | POA: Diagnosis not present

## 2023-07-26 DIAGNOSIS — E119 Type 2 diabetes mellitus without complications: Secondary | ICD-10-CM | POA: Insufficient documentation

## 2023-07-26 DIAGNOSIS — Z79899 Other long term (current) drug therapy: Secondary | ICD-10-CM | POA: Insufficient documentation

## 2023-07-26 DIAGNOSIS — N3281 Overactive bladder: Secondary | ICD-10-CM | POA: Diagnosis not present

## 2023-07-26 DIAGNOSIS — Z8546 Personal history of malignant neoplasm of prostate: Secondary | ICD-10-CM

## 2023-07-26 HISTORY — PX: IR CYSTOSTOMY TUBE PLACEMENT/BLADDER ASPIRATION: IMG1097

## 2023-07-26 LAB — CBC WITH DIFFERENTIAL/PLATELET
Abs Immature Granulocytes: 5.2 10*3/uL — ABNORMAL HIGH (ref 0.00–0.07)
Basophils Absolute: 0.1 10*3/uL (ref 0.0–0.1)
Basophils Relative: 0 %
Eosinophils Absolute: 0 10*3/uL (ref 0.0–0.5)
Eosinophils Relative: 0 %
HCT: 28.7 % — ABNORMAL LOW (ref 39.0–52.0)
Hemoglobin: 8.8 g/dL — ABNORMAL LOW (ref 13.0–17.0)
Immature Granulocytes: 10 %
Lymphocytes Relative: 2 %
Lymphs Abs: 1 10*3/uL (ref 0.7–4.0)
MCH: 29.2 pg (ref 26.0–34.0)
MCHC: 30.7 g/dL (ref 30.0–36.0)
MCV: 95.3 fL (ref 80.0–100.0)
Monocytes Absolute: 1.1 10*3/uL — ABNORMAL HIGH (ref 0.1–1.0)
Monocytes Relative: 2 %
Neutro Abs: 43.5 10*3/uL — ABNORMAL HIGH (ref 1.7–7.7)
Neutrophils Relative %: 86 %
Platelets: 110 10*3/uL — ABNORMAL LOW (ref 150–400)
RBC: 3.01 MIL/uL — ABNORMAL LOW (ref 4.22–5.81)
RDW: 20.6 % — ABNORMAL HIGH (ref 11.5–15.5)
WBC: 50.9 10*3/uL (ref 4.0–10.5)
nRBC: 0 % (ref 0.0–0.2)

## 2023-07-26 LAB — BASIC METABOLIC PANEL WITH GFR
Anion gap: 8 (ref 5–15)
BUN: 12 mg/dL (ref 8–23)
CO2: 24 mmol/L (ref 22–32)
Calcium: 8.3 mg/dL — ABNORMAL LOW (ref 8.9–10.3)
Chloride: 107 mmol/L (ref 98–111)
Creatinine, Ser: 0.75 mg/dL (ref 0.61–1.24)
GFR, Estimated: >60 mL/min (ref >60)
Glucose, Bld: 78 mg/dL (ref 70–99)
Potassium: 3.5 mmol/L (ref 3.5–5.1)
Sodium: 139 mmol/L (ref 135–145)

## 2023-07-26 LAB — PROTIME-INR
INR: 1.2 (ref 0.8–1.2)
Prothrombin Time: 15.5 s — ABNORMAL HIGH (ref 11.4–15.2)

## 2023-07-26 LAB — GLUCOSE, CAPILLARY: Glucose-Capillary: 78 mg/dL (ref 70–99)

## 2023-07-26 MED ORDER — SODIUM CHLORIDE 0.9% FLUSH: 5.0000 mL | Freq: Three times a day (TID) | INTRAVENOUS | Status: DC

## 2023-07-26 MED ORDER — MIDAZOLAM HCL 2 MG/2ML IJ SOLN
INTRAMUSCULAR | Status: AC
  Filled 2023-07-26: qty 2

## 2023-07-26 MED ORDER — IOHEXOL 300 MG/ML  SOLN
50.0000 mL | Freq: Once | INTRAMUSCULAR | Status: AC | PRN
  Administered 2023-07-26: 15 mL

## 2023-07-26 MED ORDER — SODIUM CHLORIDE 0.9 % IV SOLN: INTRAVENOUS | Status: DC

## 2023-07-26 MED ORDER — FENTANYL CITRATE (PF) 100 MCG/2ML IJ SOLN
INTRAMUSCULAR | Status: AC
  Filled 2023-07-26: qty 2

## 2023-07-26 MED ORDER — LIDOCAINE VISCOUS HCL 2 % MT SOLN
OROMUCOSAL | Status: AC
  Filled 2023-07-26: qty 15

## 2023-07-26 MED ORDER — LIDOCAINE-EPINEPHRINE 1 %-1:100000 IJ SOLN
INTRAMUSCULAR | Status: AC
  Filled 2023-07-26: qty 1

## 2023-07-26 MED ORDER — HEPARIN SOD (PORK) LOCK FLUSH 100 UNIT/ML IV SOLN
500.0000 [IU] | Freq: Once | INTRAVENOUS | Status: AC
  Administered 2023-07-26: 500 [IU] via INTRAVENOUS
  Filled 2023-07-26: qty 5

## 2023-07-26 MED ORDER — LIDOCAINE VISCOUS HCL 2 % MT SOLN
15.0000 mL | Freq: Once | OROMUCOSAL | Status: AC
  Administered 2023-07-26: 5 mL via OROMUCOSAL

## 2023-07-26 MED ORDER — SODIUM CHLORIDE 0.9 % IV SOLN
2.0000 g | Freq: Once | INTRAVENOUS | Status: AC
  Administered 2023-07-26: 2 g via INTRAVENOUS

## 2023-07-26 MED ORDER — FENTANYL CITRATE (PF) 100 MCG/2ML IJ SOLN
INTRAMUSCULAR | Status: DC | PRN
  Administered 2023-07-26 (×6): 50 ug via INTRAVENOUS

## 2023-07-26 MED ORDER — SODIUM CHLORIDE 0.9 % IV SOLN
INTRAVENOUS | Status: AC
  Filled 2023-07-26: qty 20

## 2023-07-26 MED ORDER — LIDOCAINE-EPINEPHRINE 1 %-1:100000 IJ SOLN
20.0000 mL | Freq: Once | INTRAMUSCULAR | Status: AC
  Administered 2023-07-26: 7 mL via INTRADERMAL

## 2023-07-26 MED ORDER — SODIUM CHLORIDE 0.9% FLUSH: 10.0000 mL | Freq: Two times a day (BID) | INTRAVENOUS | Status: DC

## 2023-07-26 MED ORDER — MIDAZOLAM HCL 2 MG/2ML IJ SOLN
INTRAMUSCULAR | Status: DC | PRN
  Administered 2023-07-26: 1 mg via INTRAVENOUS
  Administered 2023-07-26: 2 mg via INTRAVENOUS
  Administered 2023-07-26 (×3): 1 mg via INTRAVENOUS

## 2023-07-26 MED ORDER — SODIUM CHLORIDE 0.9% FLUSH: 10.0000 mL | INTRAVENOUS | Status: DC | PRN

## 2023-07-26 MED ORDER — FENTANYL CITRATE (PF) 100 MCG/2ML IJ SOLN
INTRAMUSCULAR | Status: AC
Start: 2023-07-26 — End: ?
  Filled 2023-07-26: qty 2

## 2023-07-26 NOTE — Progress Notes (Signed)
 Date and time results received: 07/26/23 1630  Test: CBC WBC Critical Value: 50.9  Name of Provider Notified: Clarke Crouch RN  Orders Received? Or Actions Taken?: Dr. Marne Sings was made aware and after comparing CBC from 3 days ago stated there was no possible chance it could be that high as of today. He stated the pt. Could go home.

## 2023-07-26 NOTE — Procedures (Signed)
 Interventional Radiology Procedure Note  Procedure: 79F suprapubic catheter placement.  Councill-tip foley.   Complications: None  Estimated Blood Loss: None  Recommendations: - DC home   Signed,  Roxie Cord, MD

## 2023-07-26 NOTE — Discharge Instructions (Signed)
 Please call Interventional Radiology clinic [phone] with any questions or concerns.  You may remove your dressing and shower tomorrow.  After the procedure, it is common to have Discomfort around the opening in your abdomen Blood in the urine is normal and will improve  Follow these instructions at home:  Medication: Do not use Aspirin  or ibuprofen products, such as Advil or Motrin, as it may increase bleeding.  You may resume your usual medications as ordered by your doctor If your doctor prescribed antibiotics, take them as directed. Do not stop taking them just because you feel better. You need to take the full course of antibiotics  Eating and drinking: Drink plenty of liquids to keep your urine pale yellow You can resume your regular diet as directed by your doctor   Care of the procedure site Always wash your hands for at least 20 seconds before and after caring for your catheter and collection bag. Use a mild, fragrance-free soap. If soap and water  are not available, use hand sanitizer. Clean the outside of the catheter with soap and water  as often as told by your provider Always make sure there are no twists or kinks in the catheter tube Always make sure there are no leaks in the catheter or collection bag Always wear the leg bag below your knee Make sure the overnight drainage bag is always lower than the level of your bladder Do not let the bag touch the floor Before you go to sleep, hang the bag inside a wastebasket that is covered by a clean plastic bag. Drink enough fluid to keep your pee pale yellow  Activity Do not take baths, swim, or use a hot tub until your health care provider approves. Take showers only Keep all follow-up visits as told by your doctor  Contact your doctor or seek immediate medical care if: You have any signs of infection around your catheter You have a fever or chills There is a change in the color or smell of your pee You have vomiting  that does not stop You have back pain You have blood in your pee You have a hard time changing your catheter You leak pee around your catheter Your pee flow slows down  Get help right away if: Your catheter comes out and you are unable to insert a new one You have no pee flow for 1 hour

## 2023-07-26 NOTE — Progress Notes (Signed)
 Called of labs with critical value.  McKenzie from [employer] stated will contact Dr. Marne Sings and see if would like a redraw of blood. At 1656/ McKenzie called and stated Dr. Marne Sings did not want a repeat of labs and patient could be discharged home.

## 2023-07-26 NOTE — H&P (Signed)
 Chief Complaint: Chronic urinary retention, neurogenic/overactive bladder with incontinence/Foley catheter in place; prostate cancer, non-Hodgkin's lymphoma; referred now for suprapubic catheter placement  Referring Provider(s): Wrenn,J  Supervising Physician: Fernando Hoyer  Patient Status: [name] - Out-pt  History of Present Illness: Jonathan [name] is an 83 y.o. male with past medical history significant for bladder diverticulum with prior resection in 2012, AAA, anxiety, COPD, depression, PTSD, diabetes, hyperlipidemia, GERD, gout, atrial fibrillation, colon polyps, lower extremity DVT, nephrolithiasis, prostate cancer, neurogenic bladder, chronic urinary retention, recurrent UTIs, hypertension, spinal stenosis, osteoarthritis, lung cancer, non Hodgkin's lymphoma, recent admission at [facility] last month for GI bleed.  At that time patient had a Foley catheter placed for retention and a failed voiding trial.  He has overactive bladder with incontinence and an elevated postvoid residual.  Patient currently has pain with Foley catheter in place and request now received from urology for suprapubic catheter placement.   Patient is Full Code  Past Medical History:  Diagnosis Date   Acquired bladder diverticulum    12/ 2012  resection diverticulum done at [facility] in [name], [name]   Age-related cataract of right eye    scheduled for catarat extraction 09-12-2017   Agent orange exposure    Aneurysm of infrarenal abdominal aorta (HCC)    first dx 2017--- last CT 06-11-2017  measures 3.5cm   Anxiety    Chronic constipation    COPD with emphysema (HCC)    06--12-2017  per pt no symptoms since Feb 2019   Depression    PTSD   Diabetes mellitus type 2, diet-controlled (HCC)    Diabetic neuropathy (HCC)    Dyslipidemia    Erectile dysfunction    Feeling of incomplete bladder emptying    Gait abnormality 01/06/2020   GERD (gastroesophageal reflux disease)    Gout    09-02-2017 last flare  up 3 months ago   Hiatal hernia    History of atrial fibrillation    episode post op right lung lobectomy 07-04-2015   History of bladder stone    History of colon polyps    History of DVT of lower extremity    03/ 2019  bilateral lower extremity (superficial) ---  per treated w/ oral medication    History of gastritis    History of kidney stones    History of radiation therapy 09-14-2015  to 09-21-2015   left upper lung nodule -- 54Gy in 3 fractions (18 Gy per fraction)   Hyperplasia of prostate with lower urinary tract symptoms (LUTS)    Hypertension    Lumbar spinal stenosis    Nephrolithiasis    Neurogenic bladder    Osteoarthritis    ankle, hands   Osteoporosis    Prostate cancer [facility]) urologist-  dr wrenn/  oncologist-  dr Lorri Rota   dx 05-24-2017-- Stage T2b,  Gleason 4+4,  PSA 22.41,  vol 38cc--- started ADT 04/ 2019,  plan external radiation therapy   Radiation fibrosis of lung (HCC)    hx left upper long nodule SBRT 06/ 2017   Squamous cell carcinoma of both lungs (HCC) last CT in epic dated 06-26-2017 no recurrence   dx 03/ 2017  non-small cell SCC via bronchoscopy w/ bx's by dr Luna Salinas---  s/p  right VATS w/ right lobectomy and node dissection's 07-04-2015 /  pt had SBRT to left upper nodule Stage 1 ([facility]) completed 09-21-2015   Vertigo 2018   Wears dentures    bottom   Wears glasses  Past Surgical History:  Procedure Laterality Date   BIOPSY  05/29/2019   Procedure: BIOPSY;  Surgeon: Ruby Corporal, MD;  [facility];  Service: Endoscopy;;  gastric ulcer   CARPAL TUNNEL RELEASE Right 07/09/2014   Procedure: RIGHT OPEN CARPAL TUNNEL RELEASE;  Surgeon: Arnie Lao, MD;  Location: [facility];  Service: Orthopedics;  Laterality: Right;   CHOLECYSTECTOMY N/A 02/24/2014   Procedure: LAPAROSCOPIC CHOLECYSTECTOMY;  Surgeon: Oza Blumenthal, MD;  Location: [facility];  Service: General;  Laterality: N/A;   COLONOSCOPY      [facility]ST[facility]ITH[facility]  [facility] [place]   CYSTOSCOPY N/A 04/18/2018   Procedure: CYSTOSCOPY FLEXIBLE;  Surgeon: Homero Luster, MD;  Location: [facility];  Service: Urology;  Laterality: N/A;   CYSTOSCOPY W/ LITHOLAPAXY / EHL  02-24-2007   dr Levi Real  [facility]   dental implant     No teeth at this time waiting for them to be made as of 01-30-19   ESOPHAGOGASTRODUODENOSCOPY (EGD) WITH PROPOFOL  N/A 05/29/2019   Procedure: ESOPHAGOGASTRODUODENOSCOPY (EGD) WITH PROPOFOL ;  Surgeon: Ruby Corporal, MD;  [facility];  Service: Endoscopy;  Laterality: N/A;  925   GOLD SEED IMPLANT N/A 09/05/2017   Procedure: GOLD SEED IMPLANT;  Surgeon: Homero Luster, MD;  Location: [facility];  Service: Urology;  Laterality: N/A;   INSERTION OF SUPRAPUBIC CATHETER N/A 04/18/2018   Procedure: SUPRAPUBIC TUBE CHANGE;  Surgeon: Homero Luster, MD;  Location: [facility];  Service: Urology;  Laterality: N/A;   IR CATHETER TUBE CHANGE  02/24/2018   LUMBAR LAMINECTOMY/DECOMPRESSION MICRODISCECTOMY Right 03/31/2021   Procedure: Bilateral Lumbar Three- Four, Lumbar Four-Five Laminectomy, Decompression;  Surgeon: Audie Bleacher, MD;  Location: [facility];  Service: Neurosurgery;  Laterality: Right;   SPACE OAR INSTILLATION N/A 09/05/2017   Procedure: SPACE OAR INSTILLATION;  Surgeon: Homero Luster, MD;  Location: [facility];  Service: Urology;  Laterality: N/A;   TOTAL ANKLE ARTHROPLASTY Right 04/20/2015   Procedure: TOTAL ANKLE ARTHOPLASTY;  Surgeon: Timothy Ford, MD;  Location: [facility];  Service: Orthopedics;  Laterality: Right;   TRANSTHORACIC ECHOCARDIOGRAM  11/16/2012   ef 55-60%,  grade 1 diastolic dysfunction/  mild dilated ascending aorta/  mild MR/ trivial TR   TRANSURETHRAL RESECTION OF PROSTATE N/A 02/03/2019   Procedure: TRANSURETHRAL RESECTION OF THE PROSTATE (TURP);  Surgeon: Homero Luster, MD;  [facility];  Service: Urology;  Laterality: N/A;   VIDEO ASSISTED THORACOSCOPY (VATS)/ LOBECTOMY Right  07/04/2015   Procedure: VIDEO ASSISTED THORACOSCOPY (VATS)/ RIGHT UPPER LOBECTOMY;  Surgeon: Zelphia Higashi, MD;  Location: [facility] OR;  Service: Thoracic;  Laterality: Right;   VIDEO BRONCHOSCOPY N/A 06/17/2015   Procedure: VIDEO BRONCHOSCOPY;  Surgeon: Zelphia Higashi, MD;  Location: [facility] OR;  Service: Thoracic;  Laterality: N/A;   VIDEO BRONCHOSCOPY WITH ENDOBRONCHIAL NAVIGATION N/A 06/17/2015   Procedure: VIDEO BRONCHOSCOPY WITH ENDOBRONCHIAL NAVIGATION;  Surgeon: Zelphia Higashi, MD;  [facility];  Service: Thoracic;  Laterality: N/A;   VIDEO BRONCHOSCOPY WITH ENDOBRONCHIAL ULTRASOUND N/A 06/17/2015   Procedure: VIDEO BRONCHOSCOPY WITH ENDOBRONCHIAL ULTRASOUND;  Surgeon: Zelphia Higashi, MD;  Location: [facility];  Service: Thoracic;  Laterality: N/A;    Allergies: Patient has no active allergies.  Medications: Prior to Admission medications   Medication Sig Start Date End Date Taking? Authorizing Provider  albuterol  (PROVENTIL ) (2.5 MG/3ML) 0.083% nebulizer solution Take 3 mLs (2.5 mg total) by nebulization every 6 (six) hours as needed  for shortness of breath. 04/06/21   Early Glisson, MD  atorvastatin  (LIPITOR) 20 MG tablet Take 20 mg by mouth daily. 10/05/21   [provider]  diclofenac Sodium (VOLTAREN) 1 % GEL Apply 2 g topically 4 (four) times daily as needed (pain).    [provider]  Docusate Sodium  (DSS) 100 MG CAPS Take 100 mg by mouth daily.    [provider]  donepezil  (ARICEPT ) 10 MG tablet Take 10 mg by mouth daily. 09/10/22   [provider]  escitalopram  (LEXAPRO ) 5 MG tablet Take 5 mg by mouth daily.    [provider]  feeding supplement (ENSURE ENLIVE / ENSURE PLUS) LIQD Take 237 mLs by mouth 2 (two) times daily between meals. 12/30/22   Aura Leeds Latif, DO  FREESTYLE LITE test strip  09/02/19   [provider]  gabapentin  (NEURONTIN ) 800 MG tablet Take 800 mg by mouth daily.    [provider]   Incontinence Supply Disposable ([employer] MENS GUARD) MISC 1 Pad by Does not apply route 2 (two) times daily. 01/24/23   Homero Luster, MD  Incontinence Supply Disposable (DEPEND REAL FIT/BRIEF/MEN/L-XL) MISC 1 Pad by Does not apply route in the morning and at bedtime. 01/24/23   Homero Luster, MD  iron  polysaccharides (NIFEREX) 150 MG capsule Take 1 capsule (150 mg total) by mouth daily. 12/30/22   Aura Leeds Latif, DO  Lancets (FREESTYLE) lancets  09/02/19   [provider]  latanoprost  (XALATAN ) 0.005 % ophthalmic solution Place 1 drop into both eyes at bedtime. 01/26/21   [provider]  LINZESS  72 MCG capsule Take 72 mcg by mouth daily before breakfast. 05/21/19   [provider]  meclizine  (ANTIVERT ) 12.5 MG tablet Take 6.25-12.5 mg by mouth every 6 (six) hours as needed for dizziness. 02/16/21   [provider]  methocarbamol  (ROBAXIN ) 500 MG tablet Take 1 tablet (500 mg total) by mouth 2 (two) times daily as needed for muscle spasms. 04/13/21   Caccavale, Sophia, PA-C  metoCLOPramide  (REGLAN ) 5 MG tablet Take 5 mg by mouth daily as needed for vomiting or nausea.    [provider]  mupirocin  cream (BACTROBAN ) 2 % Apply 1 application topically 2 (two) times daily as needed (wound care).    [provider]  Oxycodone  HCl 20 MG TABS Take 5 mg by mouth See admin instructions. Takes 1/4 of a pill 4 times a day 02/02/18   [provider]  polyethylene glycol (MIRALAX  / GLYCOLAX ) 17 g packet Take 17 g by mouth daily as needed for mild constipation. 12/30/22   Sheikh, Omair Latif, DO  potassium chloride  SA (KLOR-CON  M) 20 MEQ tablet Take 1 tablet (20 mEq total) by mouth 2 (two) times daily. 12/26/22   Davis, Jonathon H, MD  predniSONE  (DELTASONE ) 20 MG tablet Take 20 mg by mouth daily as needed (unknown).    [provider]  psyllium (METAMUCIL SMOOTH TEXTURE) 58.6 % powder Take 1 packet by mouth daily. 04/27/19   Rehman, Mathews Solomons, MD   ROCKLATAN  0.02-0.005 % SOLN Place 1 drop into both eyes at bedtime. 02/22/21   [provider]  scopolamine (TRANSDERM-SCOP) 1 MG/3DAYS 1 patch every 3 (three) days. 09/07/22   [provider]  sildenafil  (VIAGRA ) 100 MG tablet Take 1 tablet (100 mg total) by mouth daily as needed for erectile dysfunction. 01/24/23   Homero Luster, MD  SPIRIVA  HANDIHALER 18 MCG inhalation capsule Place 18 mcg into inhaler and inhale as needed. 03/16/20  [provider]     Family History  Problem Relation Age of Onset   Cancer Father        Asbestos   Colon cancer Neg Hx    Colon polyps Neg Hx    Kidney disease Neg Hx    Esophageal cancer Neg Hx    Gallbladder disease Neg Hx    Heart disease Neg Hx    Diabetes Neg Hx     Social History   Socioeconomic History   Marital status: Married    Spouse name: Moise Anes   Number of children: 2   Years of education: college   Highest education level: Not on file  Occupational History   Occupation: retired  Tobacco Use   Smoking status: Former    Current packs/day: 0.00    Average packs/day: 0.5 packs/day for 40.0 years (20.0 ttl pk-yrs)    Types: Cigarettes    Start date: 02/13/1975    Quit date: 02/13/2015    Years since quitting: 8.4   Smokeless tobacco: Never  Vaping Use   Vaping status: Never Used  Substance and Sexual Activity   Alcohol  use: No    Alcohol /week: 0.0 standard drinks of alcohol    Drug use: No   Sexual activity: Not Currently  Other Topics Concern   Not on file  Social History Narrative   Lives with wife, daughter and son in law temporarily to help them out   Right Handed   Drinks caffeine occassionally   Social Drivers of Health   Financial Resource Strain: Low Risk  (06/07/2023)   Received from [facility]   Overall Financial Resource Strain (CARDIA)    Difficulty of Paying Living Expenses: Not hard at all  Food Insecurity: No Food Insecurity (07/01/2023)   Received from [facility]    Hunger Vital Sign    Worried About Running Out of Food in the Last Year: Never true    Ran Out of Food in the Last Year: Never true  Transportation Needs: No Transportation Needs (07/02/2023)   Received from [facility] - Transportation    Lack of Transportation (Medical): No    Lack of Transportation (Non-Medical): No  Physical Activity: Insufficiently Active (11/04/2022)   Received from [facility]   Exercise Vital Sign    Days of Exercise per Week: 3 days    Minutes of Exercise per Session: 30 min  Stress: No Stress Concern Present (07/01/2023)   Received from [facility] - Occupational Stress Questionnaire    Feeling of Stress : Not at all  Social Connections: Socially Integrated (11/04/2022)   Received from [facility]   Social Connection and Isolation Panel [NHANES]    Frequency of Communication with Friends and Family: More than three times a week    Frequency of Social Gatherings with Friends and Family: More than three times a week    Attends Religious Services: More than 4 times per year    Active Member of [employer] or Organizations: Yes    Attends Banker Meetings: 1 to 4 times per year    Marital Status: Married      Review of Systems denies fever,HA,CP, N/V or bleeding; he does have dyspnea, cough, lower abd/suprapubic/penile pain, back pain  Vital Signs: Vitals:   07/26/23 1217  BP: 119/77  Pulse: 98  Resp: 16  Temp: 98.2 F (36.8 C)  SpO2: 98%      Advance  Care Plan: No documents on file    Physical Exam: awake/alert; chest- distant BS bilat; rt chest wall port intact; heart- RRR; abd-soft,+BS, mildly tender SP region; foley cath in place draining yellow urine; no sig LE edema  Imaging: No results found.  Labs:  CBC: Recent Labs    12/27/22 0640 12/28/22 0405 12/29/22 0404 12/30/22 0414  WBC 9.1 8.2 8.3 8.9  HGB 11.3* 10.6* 10.5* 10.0*  HCT 35.8* 34.4* 33.2* 31.9*   PLT 238 249 249 233    COAGS: Recent Labs    12/28/22 1311 12/29/22 0404 12/29/22 1054  INR 1.2  --   --   APTT  --  74* 92*    BMP: Recent Labs    12/27/22 0640 12/28/22 0405 12/29/22 0404 12/30/22 0414  NA 136 133* 134* 136  K 3.8 3.3* 3.4* 3.8  CL 98 98 100 104  CO2 27 25 25 25   GLUCOSE 103* 97 92 96  BUN 14 13 12 11   CALCIUM  8.4* 8.0* 7.9* 7.9*  CREATININE 1.13 1.13 1.21 1.23  GFRNONAA >60 >60 >60 59*    LIVER FUNCTION TESTS: Recent Labs    12/26/22 1225 12/28/22 0405 12/29/22 0404 12/30/22 0414  BILITOT 0.7 0.9 0.6 0.7  AST 16 12* 13* 13*  ALT 14 12 9 10   ALKPHOS 77 60 59 59  PROT 8.0 6.9 6.5 6.4*  ALBUMIN  3.1* 2.7* 2.6* 2.6*    TUMOR MARKERS: No results for input(s): "AFPTM", "CEA", "CA199", "CHROMGRNA" in the last 8760 hours.  Assessment and Plan: 83 y.o. male with past medical history significant for bladder diverticulum with prior resection in 2012, AAA, anxiety, COPD, depression, PTSD, diabetes, hyperlipidemia, GERD, gout, atrial fibrillation, colon polyps, lower extremity DVT, nephrolithiasis, prostate cancer, neurogenic bladder, chronic urinary retention, hypertension, spinal stenosis, osteoarthritis, lung cancer, non Hodgkin's lymphoma, recent admission at [facility] last month for GI bleed.  At that time patient had a Foley catheter placed for retention and a failed voiding trial.  He has overactive bladder with incontinence and an elevated postvoid residual along with recurrent UTIs.  Patient currently has pain with Foley catheter in place and request now received from urology for image guided suprapubic catheter placement.  Patient known to IR team from suprapubic catheter placement in 2019 and core biopsy of expansile lytic right iliac lesion in 2024. Details/risks of procedure, including but not limited to, internal bleeding, infection, injury to adjacent structures discussed with patient /spouse with their  understanding and consent.   Thank you  for allowing our service to participate in Jonathan [name] 's care.  Electronically Signed: D. Honore Lux, PA-C   07/26/2023, 8:51 AM      I spent a total of    25 Minutes in face to face in clinical consultation, greater than 50% of which was counseling/coordinating care for image guided suprapubic catheter placement

## 2023-07-29 DIAGNOSIS — K6289 Other specified diseases of anus and rectum: Secondary | ICD-10-CM | POA: Diagnosis not present

## 2023-07-29 DIAGNOSIS — C61 Malignant neoplasm of prostate: Secondary | ICD-10-CM | POA: Diagnosis not present

## 2023-07-29 DIAGNOSIS — D630 Anemia in neoplastic disease: Secondary | ICD-10-CM | POA: Diagnosis not present

## 2023-07-29 DIAGNOSIS — K648 Other hemorrhoids: Secondary | ICD-10-CM | POA: Diagnosis not present

## 2023-07-29 DIAGNOSIS — C3491 Malignant neoplasm of unspecified part of right bronchus or lung: Secondary | ICD-10-CM | POA: Diagnosis not present

## 2023-07-29 DIAGNOSIS — G893 Neoplasm related pain (acute) (chronic): Secondary | ICD-10-CM | POA: Diagnosis not present

## 2023-07-29 DIAGNOSIS — D61810 Antineoplastic chemotherapy induced pancytopenia: Secondary | ICD-10-CM | POA: Diagnosis not present

## 2023-07-29 DIAGNOSIS — C8461 Anaplastic large cell lymphoma, ALK-positive, lymph nodes of head, face, and neck: Secondary | ICD-10-CM | POA: Diagnosis not present

## 2023-07-29 DIAGNOSIS — C3412 Malignant neoplasm of upper lobe, left bronchus or lung: Secondary | ICD-10-CM | POA: Diagnosis not present

## 2023-07-30 LAB — URINE CULTURE

## 2023-07-31 ENCOUNTER — Telehealth: Payer: Self-pay

## 2023-07-31 DIAGNOSIS — C3491 Malignant neoplasm of unspecified part of right bronchus or lung: Secondary | ICD-10-CM | POA: Diagnosis not present

## 2023-07-31 DIAGNOSIS — C8461 Anaplastic large cell lymphoma, ALK-positive, lymph nodes of head, face, and neck: Secondary | ICD-10-CM | POA: Diagnosis not present

## 2023-07-31 DIAGNOSIS — N390 Urinary tract infection, site not specified: Secondary | ICD-10-CM

## 2023-07-31 DIAGNOSIS — K648 Other hemorrhoids: Secondary | ICD-10-CM | POA: Diagnosis not present

## 2023-07-31 DIAGNOSIS — D630 Anemia in neoplastic disease: Secondary | ICD-10-CM | POA: Diagnosis not present

## 2023-07-31 DIAGNOSIS — C61 Malignant neoplasm of prostate: Secondary | ICD-10-CM | POA: Diagnosis not present

## 2023-07-31 DIAGNOSIS — K6289 Other specified diseases of anus and rectum: Secondary | ICD-10-CM | POA: Diagnosis not present

## 2023-07-31 DIAGNOSIS — C3412 Malignant neoplasm of upper lobe, left bronchus or lung: Secondary | ICD-10-CM | POA: Diagnosis not present

## 2023-07-31 DIAGNOSIS — G893 Neoplasm related pain (acute) (chronic): Secondary | ICD-10-CM | POA: Diagnosis not present

## 2023-07-31 DIAGNOSIS — D61810 Antineoplastic chemotherapy induced pancytopenia: Secondary | ICD-10-CM | POA: Diagnosis not present

## 2023-07-31 MED ORDER — LEVOFLOXACIN 500 MG PO TABS: 500.0000 mg | ORAL_TABLET | Freq: Every day | ORAL | 0 refills | Status: DC

## 2023-07-31 NOTE — Telephone Encounter (Signed)
-----   Message from Homero Luster sent at 07/31/2023 12:46 PM EDT ----- He has 2 bacteria but with the suprapubic tube, I don't want to treat unless he had UTI symptoms or bleeding.   If he is having those, he could have levaquin  500mg  po qday #5. ----- Message ----- From: Dyke Glasser, CMA Sent: 07/30/2023   4:12 PM EDT To: Homero Luster, MD  Please review.

## 2023-07-31 NOTE — Telephone Encounter (Signed)
 Pt states he has having some blood and some pain in his pelvic area pt notified levaquin  would be sent to his pharmacy pt states he's ahad levaquin  before and he believes it doesn't work

## 2023-08-01 DIAGNOSIS — Z79899 Other long term (current) drug therapy: Secondary | ICD-10-CM | POA: Diagnosis not present

## 2023-08-01 DIAGNOSIS — Z5181 Encounter for therapeutic drug level monitoring: Secondary | ICD-10-CM | POA: Diagnosis not present

## 2023-08-01 DIAGNOSIS — I77819 Aortic ectasia, unspecified site: Secondary | ICD-10-CM | POA: Diagnosis not present

## 2023-08-01 DIAGNOSIS — I081 Rheumatic disorders of both mitral and tricuspid valves: Secondary | ICD-10-CM | POA: Diagnosis not present

## 2023-08-02 DIAGNOSIS — K5900 Constipation, unspecified: Secondary | ICD-10-CM | POA: Diagnosis not present

## 2023-08-02 DIAGNOSIS — Z452 Encounter for adjustment and management of vascular access device: Secondary | ICD-10-CM | POA: Diagnosis not present

## 2023-08-02 DIAGNOSIS — Z87891 Personal history of nicotine dependence: Secondary | ICD-10-CM | POA: Diagnosis not present

## 2023-08-02 DIAGNOSIS — L89159 Pressure ulcer of sacral region, unspecified stage: Secondary | ICD-10-CM | POA: Diagnosis not present

## 2023-08-02 DIAGNOSIS — J984 Other disorders of lung: Secondary | ICD-10-CM | POA: Diagnosis not present

## 2023-08-02 DIAGNOSIS — I129 Hypertensive chronic kidney disease with stage 1 through stage 4 chronic kidney disease, or unspecified chronic kidney disease: Secondary | ICD-10-CM | POA: Diagnosis not present

## 2023-08-02 DIAGNOSIS — Z8572 Personal history of non-Hodgkin lymphomas: Secondary | ICD-10-CM | POA: Diagnosis not present

## 2023-08-02 DIAGNOSIS — E1122 Type 2 diabetes mellitus with diabetic chronic kidney disease: Secondary | ICD-10-CM | POA: Diagnosis not present

## 2023-08-02 DIAGNOSIS — Z885 Allergy status to narcotic agent status: Secondary | ICD-10-CM | POA: Diagnosis not present

## 2023-08-02 DIAGNOSIS — R109 Unspecified abdominal pain: Secondary | ICD-10-CM | POA: Diagnosis not present

## 2023-08-02 DIAGNOSIS — G8929 Other chronic pain: Secondary | ICD-10-CM | POA: Diagnosis not present

## 2023-08-02 DIAGNOSIS — N189 Chronic kidney disease, unspecified: Secondary | ICD-10-CM | POA: Diagnosis not present

## 2023-08-02 NOTE — Telephone Encounter (Signed)
 Called pt and left detailed vm from MD see previous encounter from MD for details

## 2023-08-05 DIAGNOSIS — K648 Other hemorrhoids: Secondary | ICD-10-CM | POA: Diagnosis not present

## 2023-08-05 DIAGNOSIS — C8461 Anaplastic large cell lymphoma, ALK-positive, lymph nodes of head, face, and neck: Secondary | ICD-10-CM | POA: Diagnosis not present

## 2023-08-05 DIAGNOSIS — C61 Malignant neoplasm of prostate: Secondary | ICD-10-CM | POA: Diagnosis not present

## 2023-08-05 DIAGNOSIS — D630 Anemia in neoplastic disease: Secondary | ICD-10-CM | POA: Diagnosis not present

## 2023-08-05 DIAGNOSIS — C3412 Malignant neoplasm of upper lobe, left bronchus or lung: Secondary | ICD-10-CM | POA: Diagnosis not present

## 2023-08-05 DIAGNOSIS — C3491 Malignant neoplasm of unspecified part of right bronchus or lung: Secondary | ICD-10-CM | POA: Diagnosis not present

## 2023-08-05 DIAGNOSIS — D61810 Antineoplastic chemotherapy induced pancytopenia: Secondary | ICD-10-CM | POA: Diagnosis not present

## 2023-08-05 DIAGNOSIS — G893 Neoplasm related pain (acute) (chronic): Secondary | ICD-10-CM | POA: Diagnosis not present

## 2023-08-05 DIAGNOSIS — K6289 Other specified diseases of anus and rectum: Secondary | ICD-10-CM | POA: Diagnosis not present

## 2023-08-07 DIAGNOSIS — C8461 Anaplastic large cell lymphoma, ALK-positive, lymph nodes of head, face, and neck: Secondary | ICD-10-CM | POA: Diagnosis not present

## 2023-08-07 DIAGNOSIS — D630 Anemia in neoplastic disease: Secondary | ICD-10-CM | POA: Diagnosis not present

## 2023-08-07 DIAGNOSIS — C3491 Malignant neoplasm of unspecified part of right bronchus or lung: Secondary | ICD-10-CM | POA: Diagnosis not present

## 2023-08-07 DIAGNOSIS — G893 Neoplasm related pain (acute) (chronic): Secondary | ICD-10-CM | POA: Diagnosis not present

## 2023-08-07 DIAGNOSIS — C61 Malignant neoplasm of prostate: Secondary | ICD-10-CM | POA: Diagnosis not present

## 2023-08-07 DIAGNOSIS — K6289 Other specified diseases of anus and rectum: Secondary | ICD-10-CM | POA: Diagnosis not present

## 2023-08-07 DIAGNOSIS — D61810 Antineoplastic chemotherapy induced pancytopenia: Secondary | ICD-10-CM | POA: Diagnosis not present

## 2023-08-07 DIAGNOSIS — K648 Other hemorrhoids: Secondary | ICD-10-CM | POA: Diagnosis not present

## 2023-08-07 DIAGNOSIS — C3412 Malignant neoplasm of upper lobe, left bronchus or lung: Secondary | ICD-10-CM | POA: Diagnosis not present

## 2023-08-09 DIAGNOSIS — J189 Pneumonia, unspecified organism: Secondary | ICD-10-CM | POA: Diagnosis not present

## 2023-08-12 DIAGNOSIS — C3412 Malignant neoplasm of upper lobe, left bronchus or lung: Secondary | ICD-10-CM | POA: Diagnosis not present

## 2023-08-12 DIAGNOSIS — K6289 Other specified diseases of anus and rectum: Secondary | ICD-10-CM | POA: Diagnosis not present

## 2023-08-12 DIAGNOSIS — G893 Neoplasm related pain (acute) (chronic): Secondary | ICD-10-CM | POA: Diagnosis not present

## 2023-08-12 DIAGNOSIS — D630 Anemia in neoplastic disease: Secondary | ICD-10-CM | POA: Diagnosis not present

## 2023-08-12 DIAGNOSIS — C61 Malignant neoplasm of prostate: Secondary | ICD-10-CM | POA: Diagnosis not present

## 2023-08-12 DIAGNOSIS — C3491 Malignant neoplasm of unspecified part of right bronchus or lung: Secondary | ICD-10-CM | POA: Diagnosis not present

## 2023-08-12 DIAGNOSIS — C8461 Anaplastic large cell lymphoma, ALK-positive, lymph nodes of head, face, and neck: Secondary | ICD-10-CM | POA: Diagnosis not present

## 2023-08-12 DIAGNOSIS — K648 Other hemorrhoids: Secondary | ICD-10-CM | POA: Diagnosis not present

## 2023-08-12 DIAGNOSIS — D61810 Antineoplastic chemotherapy induced pancytopenia: Secondary | ICD-10-CM | POA: Diagnosis not present

## 2023-08-13 DIAGNOSIS — Z8546 Personal history of malignant neoplasm of prostate: Secondary | ICD-10-CM | POA: Diagnosis not present

## 2023-08-13 DIAGNOSIS — C3491 Malignant neoplasm of unspecified part of right bronchus or lung: Secondary | ICD-10-CM | POA: Diagnosis not present

## 2023-08-13 DIAGNOSIS — D47Z9 Other specified neoplasms of uncertain behavior of lymphoid, hematopoietic and related tissue: Secondary | ICD-10-CM | POA: Diagnosis not present

## 2023-08-13 DIAGNOSIS — C61 Malignant neoplasm of prostate: Secondary | ICD-10-CM | POA: Diagnosis not present

## 2023-08-13 DIAGNOSIS — D61810 Antineoplastic chemotherapy induced pancytopenia: Secondary | ICD-10-CM | POA: Diagnosis not present

## 2023-08-13 DIAGNOSIS — G893 Neoplasm related pain (acute) (chronic): Secondary | ICD-10-CM | POA: Diagnosis not present

## 2023-08-13 DIAGNOSIS — K648 Other hemorrhoids: Secondary | ICD-10-CM | POA: Diagnosis not present

## 2023-08-13 DIAGNOSIS — T451X5A Adverse effect of antineoplastic and immunosuppressive drugs, initial encounter: Secondary | ICD-10-CM | POA: Diagnosis not present

## 2023-08-13 DIAGNOSIS — C8461 Anaplastic large cell lymphoma, ALK-positive, lymph nodes of head, face, and neck: Secondary | ICD-10-CM | POA: Diagnosis not present

## 2023-08-13 DIAGNOSIS — K6289 Other specified diseases of anus and rectum: Secondary | ICD-10-CM | POA: Diagnosis not present

## 2023-08-13 DIAGNOSIS — D701 Agranulocytosis secondary to cancer chemotherapy: Secondary | ICD-10-CM | POA: Diagnosis not present

## 2023-08-13 DIAGNOSIS — D630 Anemia in neoplastic disease: Secondary | ICD-10-CM | POA: Diagnosis not present

## 2023-08-13 DIAGNOSIS — C3412 Malignant neoplasm of upper lobe, left bronchus or lung: Secondary | ICD-10-CM | POA: Diagnosis not present

## 2023-08-13 DIAGNOSIS — C8470 Anaplastic large cell lymphoma, ALK-negative, unspecified site: Secondary | ICD-10-CM | POA: Diagnosis not present

## 2023-08-14 DIAGNOSIS — D701 Agranulocytosis secondary to cancer chemotherapy: Secondary | ICD-10-CM | POA: Diagnosis not present

## 2023-08-14 DIAGNOSIS — C8470 Anaplastic large cell lymphoma, ALK-negative, unspecified site: Secondary | ICD-10-CM | POA: Diagnosis not present

## 2023-08-14 DIAGNOSIS — T451X5A Adverse effect of antineoplastic and immunosuppressive drugs, initial encounter: Secondary | ICD-10-CM | POA: Diagnosis not present

## 2023-08-15 DIAGNOSIS — Z79899 Other long term (current) drug therapy: Secondary | ICD-10-CM | POA: Diagnosis not present

## 2023-08-15 DIAGNOSIS — C8580 Other specified types of non-Hodgkin lymphoma, unspecified site: Secondary | ICD-10-CM | POA: Diagnosis not present

## 2023-08-15 DIAGNOSIS — G894 Chronic pain syndrome: Secondary | ICD-10-CM | POA: Diagnosis not present

## 2023-08-15 DIAGNOSIS — T451X5A Adverse effect of antineoplastic and immunosuppressive drugs, initial encounter: Secondary | ICD-10-CM | POA: Diagnosis not present

## 2023-08-15 DIAGNOSIS — D701 Agranulocytosis secondary to cancer chemotherapy: Secondary | ICD-10-CM | POA: Diagnosis not present

## 2023-08-15 DIAGNOSIS — F411 Generalized anxiety disorder: Secondary | ICD-10-CM | POA: Diagnosis not present

## 2023-08-15 DIAGNOSIS — C8470 Anaplastic large cell lymphoma, ALK-negative, unspecified site: Secondary | ICD-10-CM | POA: Diagnosis not present

## 2023-08-18 DIAGNOSIS — E46 Unspecified protein-calorie malnutrition: Secondary | ICD-10-CM | POA: Diagnosis not present

## 2023-08-18 DIAGNOSIS — K219 Gastro-esophageal reflux disease without esophagitis: Secondary | ICD-10-CM | POA: Diagnosis not present

## 2023-08-18 DIAGNOSIS — J984 Other disorders of lung: Secondary | ICD-10-CM | POA: Diagnosis not present

## 2023-08-18 DIAGNOSIS — Z1152 Encounter for screening for COVID-19: Secondary | ICD-10-CM | POA: Diagnosis not present

## 2023-08-18 DIAGNOSIS — D72829 Elevated white blood cell count, unspecified: Secondary | ICD-10-CM | POA: Diagnosis not present

## 2023-08-18 DIAGNOSIS — C61 Malignant neoplasm of prostate: Secondary | ICD-10-CM | POA: Diagnosis not present

## 2023-08-18 DIAGNOSIS — Z8546 Personal history of malignant neoplasm of prostate: Secondary | ICD-10-CM | POA: Diagnosis not present

## 2023-08-18 DIAGNOSIS — Z743 Need for continuous supervision: Secondary | ICD-10-CM | POA: Diagnosis not present

## 2023-08-18 DIAGNOSIS — L989 Disorder of the skin and subcutaneous tissue, unspecified: Secondary | ICD-10-CM | POA: Diagnosis not present

## 2023-08-18 DIAGNOSIS — N319 Neuromuscular dysfunction of bladder, unspecified: Secondary | ICD-10-CM | POA: Diagnosis not present

## 2023-08-18 DIAGNOSIS — Z7901 Long term (current) use of anticoagulants: Secondary | ICD-10-CM | POA: Diagnosis not present

## 2023-08-18 DIAGNOSIS — Z79899 Other long term (current) drug therapy: Secondary | ICD-10-CM | POA: Diagnosis not present

## 2023-08-18 DIAGNOSIS — I82411 Acute embolism and thrombosis of right femoral vein: Secondary | ICD-10-CM | POA: Diagnosis not present

## 2023-08-18 DIAGNOSIS — Z4682 Encounter for fitting and adjustment of non-vascular catheter: Secondary | ICD-10-CM | POA: Diagnosis not present

## 2023-08-18 DIAGNOSIS — I2782 Chronic pulmonary embolism: Secondary | ICD-10-CM | POA: Diagnosis not present

## 2023-08-18 DIAGNOSIS — I3139 Other pericardial effusion (noninflammatory): Secondary | ICD-10-CM | POA: Diagnosis not present

## 2023-08-18 DIAGNOSIS — I2699 Other pulmonary embolism without acute cor pulmonale: Secondary | ICD-10-CM | POA: Diagnosis not present

## 2023-08-18 DIAGNOSIS — R079 Chest pain, unspecified: Secondary | ICD-10-CM | POA: Diagnosis not present

## 2023-08-18 DIAGNOSIS — N200 Calculus of kidney: Secondary | ICD-10-CM | POA: Diagnosis not present

## 2023-08-18 DIAGNOSIS — N281 Cyst of kidney, acquired: Secondary | ICD-10-CM | POA: Diagnosis not present

## 2023-08-18 DIAGNOSIS — R531 Weakness: Secondary | ICD-10-CM | POA: Diagnosis not present

## 2023-08-18 DIAGNOSIS — K5900 Constipation, unspecified: Secondary | ICD-10-CM | POA: Diagnosis not present

## 2023-08-18 DIAGNOSIS — R000 Tachycardia, unspecified: Secondary | ICD-10-CM | POA: Diagnosis not present

## 2023-08-18 DIAGNOSIS — R1032 Left lower quadrant pain: Secondary | ICD-10-CM | POA: Diagnosis not present

## 2023-08-18 DIAGNOSIS — I361 Nonrheumatic tricuspid (valve) insufficiency: Secondary | ICD-10-CM | POA: Diagnosis not present

## 2023-08-18 DIAGNOSIS — D696 Thrombocytopenia, unspecified: Secondary | ICD-10-CM | POA: Diagnosis not present

## 2023-08-18 DIAGNOSIS — R918 Other nonspecific abnormal finding of lung field: Secondary | ICD-10-CM | POA: Diagnosis not present

## 2023-08-18 DIAGNOSIS — E440 Moderate protein-calorie malnutrition: Secondary | ICD-10-CM | POA: Diagnosis not present

## 2023-08-18 DIAGNOSIS — Z681 Body mass index (BMI) 19 or less, adult: Secondary | ICD-10-CM | POA: Diagnosis not present

## 2023-08-18 DIAGNOSIS — D649 Anemia, unspecified: Secondary | ICD-10-CM | POA: Diagnosis not present

## 2023-08-18 DIAGNOSIS — C8590 Non-Hodgkin lymphoma, unspecified, unspecified site: Secondary | ICD-10-CM | POA: Diagnosis not present

## 2023-08-18 DIAGNOSIS — L89152 Pressure ulcer of sacral region, stage 2: Secondary | ICD-10-CM | POA: Diagnosis not present

## 2023-08-22 DIAGNOSIS — K6289 Other specified diseases of anus and rectum: Secondary | ICD-10-CM | POA: Diagnosis not present

## 2023-08-22 DIAGNOSIS — C3491 Malignant neoplasm of unspecified part of right bronchus or lung: Secondary | ICD-10-CM | POA: Diagnosis not present

## 2023-08-22 DIAGNOSIS — C3412 Malignant neoplasm of upper lobe, left bronchus or lung: Secondary | ICD-10-CM | POA: Diagnosis not present

## 2023-08-22 DIAGNOSIS — C61 Malignant neoplasm of prostate: Secondary | ICD-10-CM | POA: Diagnosis not present

## 2023-08-22 DIAGNOSIS — G893 Neoplasm related pain (acute) (chronic): Secondary | ICD-10-CM | POA: Diagnosis not present

## 2023-08-22 DIAGNOSIS — C8461 Anaplastic large cell lymphoma, ALK-positive, lymph nodes of head, face, and neck: Secondary | ICD-10-CM | POA: Diagnosis not present

## 2023-08-22 DIAGNOSIS — K648 Other hemorrhoids: Secondary | ICD-10-CM | POA: Diagnosis not present

## 2023-08-22 DIAGNOSIS — D630 Anemia in neoplastic disease: Secondary | ICD-10-CM | POA: Diagnosis not present

## 2023-08-22 DIAGNOSIS — D61810 Antineoplastic chemotherapy induced pancytopenia: Secondary | ICD-10-CM | POA: Diagnosis not present

## 2023-08-26 DIAGNOSIS — R6510 Systemic inflammatory response syndrome (SIRS) of non-infectious origin without acute organ dysfunction: Secondary | ICD-10-CM | POA: Diagnosis not present

## 2023-08-26 DIAGNOSIS — D630 Anemia in neoplastic disease: Secondary | ICD-10-CM | POA: Diagnosis not present

## 2023-08-26 DIAGNOSIS — K641 Second degree hemorrhoids: Secondary | ICD-10-CM | POA: Diagnosis not present

## 2023-08-26 DIAGNOSIS — C8590 Non-Hodgkin lymphoma, unspecified, unspecified site: Secondary | ICD-10-CM | POA: Diagnosis not present

## 2023-08-26 DIAGNOSIS — C3491 Malignant neoplasm of unspecified part of right bronchus or lung: Secondary | ICD-10-CM | POA: Diagnosis not present

## 2023-08-26 DIAGNOSIS — K219 Gastro-esophageal reflux disease without esophagitis: Secondary | ICD-10-CM | POA: Diagnosis not present

## 2023-08-26 DIAGNOSIS — R69 Illness, unspecified: Secondary | ICD-10-CM | POA: Diagnosis not present

## 2023-08-26 DIAGNOSIS — E1122 Type 2 diabetes mellitus with diabetic chronic kidney disease: Secondary | ICD-10-CM | POA: Diagnosis not present

## 2023-08-26 DIAGNOSIS — Z20822 Contact with and (suspected) exposure to covid-19: Secondary | ICD-10-CM | POA: Diagnosis not present

## 2023-08-26 DIAGNOSIS — E872 Acidosis, unspecified: Secondary | ICD-10-CM | POA: Diagnosis not present

## 2023-08-26 DIAGNOSIS — E876 Hypokalemia: Secondary | ICD-10-CM | POA: Diagnosis not present

## 2023-08-26 DIAGNOSIS — R000 Tachycardia, unspecified: Secondary | ICD-10-CM | POA: Diagnosis not present

## 2023-08-26 DIAGNOSIS — R079 Chest pain, unspecified: Secondary | ICD-10-CM | POA: Diagnosis not present

## 2023-08-26 DIAGNOSIS — I4819 Other persistent atrial fibrillation: Secondary | ICD-10-CM | POA: Diagnosis not present

## 2023-08-26 DIAGNOSIS — F0280 Dementia in other diseases classified elsewhere without behavioral disturbance: Secondary | ICD-10-CM | POA: Diagnosis not present

## 2023-08-26 DIAGNOSIS — Z792 Long term (current) use of antibiotics: Secondary | ICD-10-CM | POA: Diagnosis not present

## 2023-08-26 DIAGNOSIS — M6281 Muscle weakness (generalized): Secondary | ICD-10-CM | POA: Diagnosis not present

## 2023-08-26 DIAGNOSIS — J45998 Other asthma: Secondary | ICD-10-CM | POA: Diagnosis not present

## 2023-08-26 DIAGNOSIS — E441 Mild protein-calorie malnutrition: Secondary | ICD-10-CM | POA: Diagnosis not present

## 2023-08-26 DIAGNOSIS — K648 Other hemorrhoids: Secondary | ICD-10-CM | POA: Diagnosis not present

## 2023-08-26 DIAGNOSIS — Z7901 Long term (current) use of anticoagulants: Secondary | ICD-10-CM | POA: Diagnosis not present

## 2023-08-26 DIAGNOSIS — R0902 Hypoxemia: Secondary | ICD-10-CM | POA: Diagnosis not present

## 2023-08-26 DIAGNOSIS — C8468 Anaplastic large cell lymphoma, ALK-positive, lymph nodes of multiple sites: Secondary | ICD-10-CM | POA: Diagnosis not present

## 2023-08-26 DIAGNOSIS — G893 Neoplasm related pain (acute) (chronic): Secondary | ICD-10-CM | POA: Diagnosis not present

## 2023-08-26 DIAGNOSIS — I2699 Other pulmonary embolism without acute cor pulmonale: Secondary | ICD-10-CM | POA: Diagnosis not present

## 2023-08-26 DIAGNOSIS — G309 Alzheimer's disease, unspecified: Secondary | ICD-10-CM | POA: Diagnosis not present

## 2023-08-26 DIAGNOSIS — R410 Disorientation, unspecified: Secondary | ICD-10-CM | POA: Diagnosis not present

## 2023-08-26 DIAGNOSIS — C3412 Malignant neoplasm of upper lobe, left bronchus or lung: Secondary | ICD-10-CM | POA: Diagnosis not present

## 2023-08-26 DIAGNOSIS — Z79899 Other long term (current) drug therapy: Secondary | ICD-10-CM | POA: Diagnosis not present

## 2023-08-26 DIAGNOSIS — C8461 Anaplastic large cell lymphoma, ALK-positive, lymph nodes of head, face, and neck: Secondary | ICD-10-CM | POA: Diagnosis not present

## 2023-08-26 DIAGNOSIS — F02B4 Dementia in other diseases classified elsewhere, moderate, with anxiety: Secondary | ICD-10-CM | POA: Diagnosis not present

## 2023-08-26 DIAGNOSIS — J9811 Atelectasis: Secondary | ICD-10-CM | POA: Diagnosis not present

## 2023-08-26 DIAGNOSIS — I4811 Longstanding persistent atrial fibrillation: Secondary | ICD-10-CM | POA: Diagnosis not present

## 2023-08-26 DIAGNOSIS — E119 Type 2 diabetes mellitus without complications: Secondary | ICD-10-CM | POA: Diagnosis not present

## 2023-08-26 DIAGNOSIS — C61 Malignant neoplasm of prostate: Secondary | ICD-10-CM | POA: Diagnosis not present

## 2023-08-26 DIAGNOSIS — B965 Pseudomonas (aeruginosa) (mallei) (pseudomallei) as the cause of diseases classified elsewhere: Secondary | ICD-10-CM | POA: Diagnosis not present

## 2023-08-26 DIAGNOSIS — K6289 Other specified diseases of anus and rectum: Secondary | ICD-10-CM | POA: Diagnosis not present

## 2023-08-26 DIAGNOSIS — H409 Unspecified glaucoma: Secondary | ICD-10-CM | POA: Diagnosis not present

## 2023-08-26 DIAGNOSIS — N319 Neuromuscular dysfunction of bladder, unspecified: Secondary | ICD-10-CM | POA: Diagnosis not present

## 2023-08-26 DIAGNOSIS — I2782 Chronic pulmonary embolism: Secondary | ICD-10-CM | POA: Diagnosis not present

## 2023-08-26 DIAGNOSIS — F419 Anxiety disorder, unspecified: Secondary | ICD-10-CM | POA: Diagnosis not present

## 2023-08-26 DIAGNOSIS — N390 Urinary tract infection, site not specified: Secondary | ICD-10-CM | POA: Diagnosis not present

## 2023-08-26 DIAGNOSIS — J99 Respiratory disorders in diseases classified elsewhere: Secondary | ICD-10-CM | POA: Diagnosis not present

## 2023-08-26 DIAGNOSIS — N189 Chronic kidney disease, unspecified: Secondary | ICD-10-CM | POA: Diagnosis not present

## 2023-08-26 DIAGNOSIS — T83510A Infection and inflammatory reaction due to cystostomy catheter, initial encounter: Secondary | ICD-10-CM | POA: Diagnosis not present

## 2023-08-26 DIAGNOSIS — Z743 Need for continuous supervision: Secondary | ICD-10-CM | POA: Diagnosis not present

## 2023-08-26 DIAGNOSIS — Z8546 Personal history of malignant neoplasm of prostate: Secondary | ICD-10-CM | POA: Diagnosis not present

## 2023-08-26 DIAGNOSIS — D72829 Elevated white blood cell count, unspecified: Secondary | ICD-10-CM | POA: Diagnosis not present

## 2023-08-26 DIAGNOSIS — D72823 Leukemoid reaction: Secondary | ICD-10-CM | POA: Diagnosis not present

## 2023-08-26 DIAGNOSIS — D61810 Antineoplastic chemotherapy induced pancytopenia: Secondary | ICD-10-CM | POA: Diagnosis not present

## 2023-08-27 DIAGNOSIS — C3412 Malignant neoplasm of upper lobe, left bronchus or lung: Secondary | ICD-10-CM | POA: Diagnosis not present

## 2023-08-27 DIAGNOSIS — D630 Anemia in neoplastic disease: Secondary | ICD-10-CM | POA: Diagnosis not present

## 2023-08-27 DIAGNOSIS — D61810 Antineoplastic chemotherapy induced pancytopenia: Secondary | ICD-10-CM | POA: Diagnosis not present

## 2023-08-27 DIAGNOSIS — C8461 Anaplastic large cell lymphoma, ALK-positive, lymph nodes of head, face, and neck: Secondary | ICD-10-CM | POA: Diagnosis not present

## 2023-08-27 DIAGNOSIS — K648 Other hemorrhoids: Secondary | ICD-10-CM | POA: Diagnosis not present

## 2023-08-27 DIAGNOSIS — G893 Neoplasm related pain (acute) (chronic): Secondary | ICD-10-CM | POA: Diagnosis not present

## 2023-08-27 DIAGNOSIS — C3491 Malignant neoplasm of unspecified part of right bronchus or lung: Secondary | ICD-10-CM | POA: Diagnosis not present

## 2023-08-27 DIAGNOSIS — K6289 Other specified diseases of anus and rectum: Secondary | ICD-10-CM | POA: Diagnosis not present

## 2023-08-27 DIAGNOSIS — C61 Malignant neoplasm of prostate: Secondary | ICD-10-CM | POA: Diagnosis not present

## 2023-08-28 DIAGNOSIS — J9811 Atelectasis: Secondary | ICD-10-CM | POA: Diagnosis not present

## 2023-08-28 DIAGNOSIS — R079 Chest pain, unspecified: Secondary | ICD-10-CM | POA: Diagnosis not present

## 2023-08-29 DIAGNOSIS — N390 Urinary tract infection, site not specified: Secondary | ICD-10-CM | POA: Diagnosis not present

## 2023-08-29 DIAGNOSIS — N319 Neuromuscular dysfunction of bladder, unspecified: Secondary | ICD-10-CM | POA: Diagnosis not present

## 2023-08-29 DIAGNOSIS — E119 Type 2 diabetes mellitus without complications: Secondary | ICD-10-CM | POA: Diagnosis not present

## 2023-08-29 DIAGNOSIS — K219 Gastro-esophageal reflux disease without esophagitis: Secondary | ICD-10-CM | POA: Diagnosis not present

## 2023-08-29 DIAGNOSIS — I2699 Other pulmonary embolism without acute cor pulmonale: Secondary | ICD-10-CM | POA: Diagnosis not present

## 2023-08-29 DIAGNOSIS — D72823 Leukemoid reaction: Secondary | ICD-10-CM | POA: Diagnosis not present

## 2023-08-29 DIAGNOSIS — Z8546 Personal history of malignant neoplasm of prostate: Secondary | ICD-10-CM | POA: Diagnosis not present

## 2023-08-29 DIAGNOSIS — K641 Second degree hemorrhoids: Secondary | ICD-10-CM | POA: Diagnosis not present

## 2023-08-29 DIAGNOSIS — T83510A Infection and inflammatory reaction due to cystostomy catheter, initial encounter: Secondary | ICD-10-CM | POA: Diagnosis not present

## 2023-08-29 DIAGNOSIS — I4819 Other persistent atrial fibrillation: Secondary | ICD-10-CM | POA: Diagnosis not present

## 2023-08-30 ENCOUNTER — Ambulatory Visit: Admitting: Urology

## 2023-08-30 DIAGNOSIS — I4819 Other persistent atrial fibrillation: Secondary | ICD-10-CM | POA: Diagnosis not present

## 2023-08-30 DIAGNOSIS — Z79899 Other long term (current) drug therapy: Secondary | ICD-10-CM | POA: Diagnosis not present

## 2023-08-30 DIAGNOSIS — N390 Urinary tract infection, site not specified: Secondary | ICD-10-CM | POA: Diagnosis not present

## 2023-08-30 DIAGNOSIS — N319 Neuromuscular dysfunction of bladder, unspecified: Secondary | ICD-10-CM | POA: Diagnosis not present

## 2023-08-30 DIAGNOSIS — Z8546 Personal history of malignant neoplasm of prostate: Secondary | ICD-10-CM | POA: Diagnosis not present

## 2023-08-30 DIAGNOSIS — T83510A Infection and inflammatory reaction due to cystostomy catheter, initial encounter: Secondary | ICD-10-CM | POA: Diagnosis not present

## 2023-08-30 DIAGNOSIS — E119 Type 2 diabetes mellitus without complications: Secondary | ICD-10-CM | POA: Diagnosis not present

## 2023-08-30 DIAGNOSIS — Z792 Long term (current) use of antibiotics: Secondary | ICD-10-CM | POA: Diagnosis not present

## 2023-08-30 DIAGNOSIS — K219 Gastro-esophageal reflux disease without esophagitis: Secondary | ICD-10-CM | POA: Diagnosis not present

## 2023-08-31 DIAGNOSIS — N319 Neuromuscular dysfunction of bladder, unspecified: Secondary | ICD-10-CM | POA: Diagnosis not present

## 2023-08-31 DIAGNOSIS — Z8546 Personal history of malignant neoplasm of prostate: Secondary | ICD-10-CM | POA: Diagnosis not present

## 2023-08-31 DIAGNOSIS — I4819 Other persistent atrial fibrillation: Secondary | ICD-10-CM | POA: Diagnosis not present

## 2023-08-31 DIAGNOSIS — F02B4 Dementia in other diseases classified elsewhere, moderate, with anxiety: Secondary | ICD-10-CM | POA: Diagnosis not present

## 2023-08-31 DIAGNOSIS — K219 Gastro-esophageal reflux disease without esophagitis: Secondary | ICD-10-CM | POA: Diagnosis not present

## 2023-08-31 DIAGNOSIS — E119 Type 2 diabetes mellitus without complications: Secondary | ICD-10-CM | POA: Diagnosis not present

## 2023-08-31 DIAGNOSIS — N390 Urinary tract infection, site not specified: Secondary | ICD-10-CM | POA: Diagnosis not present

## 2023-08-31 DIAGNOSIS — G309 Alzheimer's disease, unspecified: Secondary | ICD-10-CM | POA: Diagnosis not present

## 2023-08-31 DIAGNOSIS — T83510A Infection and inflammatory reaction due to cystostomy catheter, initial encounter: Secondary | ICD-10-CM | POA: Diagnosis not present

## 2023-09-01 DIAGNOSIS — F02B4 Dementia in other diseases classified elsewhere, moderate, with anxiety: Secondary | ICD-10-CM | POA: Diagnosis not present

## 2023-09-01 DIAGNOSIS — I4819 Other persistent atrial fibrillation: Secondary | ICD-10-CM | POA: Diagnosis not present

## 2023-09-01 DIAGNOSIS — G309 Alzheimer's disease, unspecified: Secondary | ICD-10-CM | POA: Diagnosis not present

## 2023-09-01 DIAGNOSIS — K219 Gastro-esophageal reflux disease without esophagitis: Secondary | ICD-10-CM | POA: Diagnosis not present

## 2023-09-01 DIAGNOSIS — Z8546 Personal history of malignant neoplasm of prostate: Secondary | ICD-10-CM | POA: Diagnosis not present

## 2023-09-01 DIAGNOSIS — N390 Urinary tract infection, site not specified: Secondary | ICD-10-CM | POA: Diagnosis not present

## 2023-09-01 DIAGNOSIS — E119 Type 2 diabetes mellitus without complications: Secondary | ICD-10-CM | POA: Diagnosis not present

## 2023-09-01 DIAGNOSIS — T83510A Infection and inflammatory reaction due to cystostomy catheter, initial encounter: Secondary | ICD-10-CM | POA: Diagnosis not present

## 2023-09-01 DIAGNOSIS — N319 Neuromuscular dysfunction of bladder, unspecified: Secondary | ICD-10-CM | POA: Diagnosis not present

## 2023-09-02 DIAGNOSIS — F02B4 Dementia in other diseases classified elsewhere, moderate, with anxiety: Secondary | ICD-10-CM | POA: Diagnosis not present

## 2023-09-02 DIAGNOSIS — I4819 Other persistent atrial fibrillation: Secondary | ICD-10-CM | POA: Diagnosis not present

## 2023-09-02 DIAGNOSIS — N319 Neuromuscular dysfunction of bladder, unspecified: Secondary | ICD-10-CM | POA: Diagnosis not present

## 2023-09-02 DIAGNOSIS — N390 Urinary tract infection, site not specified: Secondary | ICD-10-CM | POA: Diagnosis not present

## 2023-09-02 DIAGNOSIS — G309 Alzheimer's disease, unspecified: Secondary | ICD-10-CM | POA: Diagnosis not present

## 2023-09-02 DIAGNOSIS — K219 Gastro-esophageal reflux disease without esophagitis: Secondary | ICD-10-CM | POA: Diagnosis not present

## 2023-09-02 DIAGNOSIS — Z8546 Personal history of malignant neoplasm of prostate: Secondary | ICD-10-CM | POA: Diagnosis not present

## 2023-09-02 DIAGNOSIS — T83510A Infection and inflammatory reaction due to cystostomy catheter, initial encounter: Secondary | ICD-10-CM | POA: Diagnosis not present

## 2023-09-02 DIAGNOSIS — E119 Type 2 diabetes mellitus without complications: Secondary | ICD-10-CM | POA: Diagnosis not present

## 2023-09-03 DIAGNOSIS — F339 Major depressive disorder, recurrent, unspecified: Secondary | ICD-10-CM | POA: Diagnosis not present

## 2023-09-03 DIAGNOSIS — K219 Gastro-esophageal reflux disease without esophagitis: Secondary | ICD-10-CM | POA: Diagnosis not present

## 2023-09-03 DIAGNOSIS — M7989 Other specified soft tissue disorders: Secondary | ICD-10-CM | POA: Diagnosis not present

## 2023-09-03 DIAGNOSIS — I82411 Acute embolism and thrombosis of right femoral vein: Secondary | ICD-10-CM | POA: Diagnosis not present

## 2023-09-03 DIAGNOSIS — Z743 Need for continuous supervision: Secondary | ICD-10-CM | POA: Diagnosis not present

## 2023-09-03 DIAGNOSIS — M6281 Muscle weakness (generalized): Secondary | ICD-10-CM | POA: Diagnosis not present

## 2023-09-03 DIAGNOSIS — D72829 Elevated white blood cell count, unspecified: Secondary | ICD-10-CM | POA: Diagnosis not present

## 2023-09-03 DIAGNOSIS — E876 Hypokalemia: Secondary | ICD-10-CM | POA: Diagnosis not present

## 2023-09-03 DIAGNOSIS — M19072 Primary osteoarthritis, left ankle and foot: Secondary | ICD-10-CM | POA: Diagnosis not present

## 2023-09-03 DIAGNOSIS — M79642 Pain in left hand: Secondary | ICD-10-CM | POA: Diagnosis not present

## 2023-09-03 DIAGNOSIS — L03114 Cellulitis of left upper limb: Secondary | ICD-10-CM | POA: Diagnosis not present

## 2023-09-03 DIAGNOSIS — M160 Bilateral primary osteoarthritis of hip: Secondary | ICD-10-CM | POA: Diagnosis not present

## 2023-09-03 DIAGNOSIS — L97919 Non-pressure chronic ulcer of unspecified part of right lower leg with unspecified severity: Secondary | ICD-10-CM | POA: Diagnosis not present

## 2023-09-03 DIAGNOSIS — G609 Hereditary and idiopathic neuropathy, unspecified: Secondary | ICD-10-CM | POA: Diagnosis not present

## 2023-09-03 DIAGNOSIS — L89514 Pressure ulcer of right ankle, stage 4: Secondary | ICD-10-CM | POA: Diagnosis not present

## 2023-09-03 DIAGNOSIS — R000 Tachycardia, unspecified: Secondary | ICD-10-CM | POA: Diagnosis not present

## 2023-09-03 DIAGNOSIS — L89626 Pressure-induced deep tissue damage of left heel: Secondary | ICD-10-CM | POA: Diagnosis not present

## 2023-09-03 DIAGNOSIS — T84196A Other mechanical complication of internal fixation device of bone of right lower leg, initial encounter: Secondary | ICD-10-CM | POA: Diagnosis not present

## 2023-09-03 DIAGNOSIS — Z880 Allergy status to penicillin: Secondary | ICD-10-CM | POA: Diagnosis not present

## 2023-09-03 DIAGNOSIS — R918 Other nonspecific abnormal finding of lung field: Secondary | ICD-10-CM | POA: Diagnosis not present

## 2023-09-03 DIAGNOSIS — N390 Urinary tract infection, site not specified: Secondary | ICD-10-CM | POA: Diagnosis not present

## 2023-09-03 DIAGNOSIS — M47816 Spondylosis without myelopathy or radiculopathy, lumbar region: Secondary | ICD-10-CM | POA: Diagnosis not present

## 2023-09-03 DIAGNOSIS — F419 Anxiety disorder, unspecified: Secondary | ICD-10-CM | POA: Diagnosis not present

## 2023-09-03 DIAGNOSIS — D649 Anemia, unspecified: Secondary | ICD-10-CM | POA: Diagnosis not present

## 2023-09-03 DIAGNOSIS — S91302A Unspecified open wound, left foot, initial encounter: Secondary | ICD-10-CM | POA: Diagnosis not present

## 2023-09-03 DIAGNOSIS — Z87891 Personal history of nicotine dependence: Secondary | ICD-10-CM | POA: Diagnosis not present

## 2023-09-03 DIAGNOSIS — H409 Unspecified glaucoma: Secondary | ICD-10-CM | POA: Diagnosis not present

## 2023-09-03 DIAGNOSIS — Z7901 Long term (current) use of anticoagulants: Secondary | ICD-10-CM | POA: Diagnosis not present

## 2023-09-03 DIAGNOSIS — J99 Respiratory disorders in diseases classified elsewhere: Secondary | ICD-10-CM | POA: Diagnosis not present

## 2023-09-03 DIAGNOSIS — T83510A Infection and inflammatory reaction due to cystostomy catheter, initial encounter: Secondary | ICD-10-CM | POA: Diagnosis not present

## 2023-09-03 DIAGNOSIS — R0902 Hypoxemia: Secondary | ICD-10-CM | POA: Diagnosis not present

## 2023-09-03 DIAGNOSIS — F0280 Dementia in other diseases classified elsewhere without behavioral disturbance: Secondary | ICD-10-CM | POA: Diagnosis not present

## 2023-09-03 DIAGNOSIS — N189 Chronic kidney disease, unspecified: Secondary | ICD-10-CM | POA: Diagnosis not present

## 2023-09-03 DIAGNOSIS — M25532 Pain in left wrist: Secondary | ICD-10-CM | POA: Diagnosis not present

## 2023-09-03 DIAGNOSIS — I129 Hypertensive chronic kidney disease with stage 1 through stage 4 chronic kidney disease, or unspecified chronic kidney disease: Secondary | ICD-10-CM | POA: Diagnosis not present

## 2023-09-03 DIAGNOSIS — M19042 Primary osteoarthritis, left hand: Secondary | ICD-10-CM | POA: Diagnosis not present

## 2023-09-03 DIAGNOSIS — T8459XA Infection and inflammatory reaction due to other internal joint prosthesis, initial encounter: Secondary | ICD-10-CM | POA: Diagnosis not present

## 2023-09-03 DIAGNOSIS — E1122 Type 2 diabetes mellitus with diabetic chronic kidney disease: Secondary | ICD-10-CM | POA: Diagnosis not present

## 2023-09-03 DIAGNOSIS — J45998 Other asthma: Secondary | ICD-10-CM | POA: Diagnosis not present

## 2023-09-03 DIAGNOSIS — I959 Hypotension, unspecified: Secondary | ICD-10-CM | POA: Diagnosis not present

## 2023-09-03 DIAGNOSIS — R69 Illness, unspecified: Secondary | ICD-10-CM | POA: Diagnosis not present

## 2023-09-03 DIAGNOSIS — E43 Unspecified severe protein-calorie malnutrition: Secondary | ICD-10-CM | POA: Diagnosis not present

## 2023-09-03 DIAGNOSIS — Z7984 Long term (current) use of oral hypoglycemic drugs: Secondary | ICD-10-CM | POA: Diagnosis not present

## 2023-09-03 DIAGNOSIS — J45909 Unspecified asthma, uncomplicated: Secondary | ICD-10-CM | POA: Diagnosis not present

## 2023-09-03 DIAGNOSIS — Z885 Allergy status to narcotic agent status: Secondary | ICD-10-CM | POA: Diagnosis not present

## 2023-09-03 DIAGNOSIS — N319 Neuromuscular dysfunction of bladder, unspecified: Secondary | ICD-10-CM | POA: Diagnosis not present

## 2023-09-03 DIAGNOSIS — R0600 Dyspnea, unspecified: Secondary | ICD-10-CM | POA: Diagnosis not present

## 2023-09-03 DIAGNOSIS — R5381 Other malaise: Secondary | ICD-10-CM | POA: Diagnosis not present

## 2023-09-03 DIAGNOSIS — R936 Abnormal findings on diagnostic imaging of limbs: Secondary | ICD-10-CM | POA: Diagnosis not present

## 2023-09-03 DIAGNOSIS — J90 Pleural effusion, not elsewhere classified: Secondary | ICD-10-CM | POA: Diagnosis not present

## 2023-09-03 DIAGNOSIS — E119 Type 2 diabetes mellitus without complications: Secondary | ICD-10-CM | POA: Diagnosis not present

## 2023-09-03 DIAGNOSIS — S59912A Unspecified injury of left forearm, initial encounter: Secondary | ICD-10-CM | POA: Diagnosis not present

## 2023-09-03 DIAGNOSIS — L97429 Non-pressure chronic ulcer of left heel and midfoot with unspecified severity: Secondary | ICD-10-CM | POA: Diagnosis not present

## 2023-09-03 DIAGNOSIS — E441 Mild protein-calorie malnutrition: Secondary | ICD-10-CM | POA: Diagnosis not present

## 2023-09-03 DIAGNOSIS — F02B4 Dementia in other diseases classified elsewhere, moderate, with anxiety: Secondary | ICD-10-CM | POA: Diagnosis not present

## 2023-09-03 DIAGNOSIS — C8590 Non-Hodgkin lymphoma, unspecified, unspecified site: Secondary | ICD-10-CM | POA: Diagnosis not present

## 2023-09-03 DIAGNOSIS — R531 Weakness: Secondary | ICD-10-CM | POA: Diagnosis not present

## 2023-09-03 DIAGNOSIS — J189 Pneumonia, unspecified organism: Secondary | ICD-10-CM | POA: Diagnosis not present

## 2023-09-03 DIAGNOSIS — A419 Sepsis, unspecified organism: Secondary | ICD-10-CM | POA: Diagnosis not present

## 2023-09-03 DIAGNOSIS — Z20822 Contact with and (suspected) exposure to covid-19: Secondary | ICD-10-CM | POA: Diagnosis not present

## 2023-09-03 DIAGNOSIS — M2042 Other hammer toe(s) (acquired), left foot: Secondary | ICD-10-CM | POA: Diagnosis not present

## 2023-09-03 DIAGNOSIS — L89150 Pressure ulcer of sacral region, unstageable: Secondary | ICD-10-CM | POA: Diagnosis not present

## 2023-09-03 DIAGNOSIS — G309 Alzheimer's disease, unspecified: Secondary | ICD-10-CM | POA: Diagnosis not present

## 2023-09-04 DIAGNOSIS — F02B4 Dementia in other diseases classified elsewhere, moderate, with anxiety: Secondary | ICD-10-CM | POA: Diagnosis not present

## 2023-09-04 DIAGNOSIS — C8590 Non-Hodgkin lymphoma, unspecified, unspecified site: Secondary | ICD-10-CM | POA: Diagnosis not present

## 2023-09-04 DIAGNOSIS — F339 Major depressive disorder, recurrent, unspecified: Secondary | ICD-10-CM | POA: Diagnosis not present

## 2023-09-04 DIAGNOSIS — G609 Hereditary and idiopathic neuropathy, unspecified: Secondary | ICD-10-CM | POA: Diagnosis not present

## 2023-09-04 DIAGNOSIS — K219 Gastro-esophageal reflux disease without esophagitis: Secondary | ICD-10-CM | POA: Diagnosis not present

## 2023-09-04 DIAGNOSIS — E876 Hypokalemia: Secondary | ICD-10-CM | POA: Diagnosis not present

## 2023-09-04 DIAGNOSIS — G309 Alzheimer's disease, unspecified: Secondary | ICD-10-CM | POA: Diagnosis not present

## 2023-09-04 DIAGNOSIS — I82411 Acute embolism and thrombosis of right femoral vein: Secondary | ICD-10-CM | POA: Diagnosis not present

## 2023-09-04 DIAGNOSIS — F0280 Dementia in other diseases classified elsewhere without behavioral disturbance: Secondary | ICD-10-CM | POA: Diagnosis not present

## 2023-09-07 DIAGNOSIS — Z885 Allergy status to narcotic agent status: Secondary | ICD-10-CM | POA: Diagnosis not present

## 2023-09-07 DIAGNOSIS — M25532 Pain in left wrist: Secondary | ICD-10-CM | POA: Diagnosis not present

## 2023-09-07 DIAGNOSIS — Z87891 Personal history of nicotine dependence: Secondary | ICD-10-CM | POA: Diagnosis not present

## 2023-09-07 DIAGNOSIS — Z7984 Long term (current) use of oral hypoglycemic drugs: Secondary | ICD-10-CM | POA: Diagnosis not present

## 2023-09-07 DIAGNOSIS — N189 Chronic kidney disease, unspecified: Secondary | ICD-10-CM | POA: Diagnosis not present

## 2023-09-07 DIAGNOSIS — I129 Hypertensive chronic kidney disease with stage 1 through stage 4 chronic kidney disease, or unspecified chronic kidney disease: Secondary | ICD-10-CM | POA: Diagnosis not present

## 2023-09-07 DIAGNOSIS — M7989 Other specified soft tissue disorders: Secondary | ICD-10-CM | POA: Diagnosis not present

## 2023-09-07 DIAGNOSIS — M19042 Primary osteoarthritis, left hand: Secondary | ICD-10-CM | POA: Diagnosis not present

## 2023-09-07 DIAGNOSIS — J45909 Unspecified asthma, uncomplicated: Secondary | ICD-10-CM | POA: Diagnosis not present

## 2023-09-07 DIAGNOSIS — R936 Abnormal findings on diagnostic imaging of limbs: Secondary | ICD-10-CM | POA: Diagnosis not present

## 2023-09-07 DIAGNOSIS — E1122 Type 2 diabetes mellitus with diabetic chronic kidney disease: Secondary | ICD-10-CM | POA: Diagnosis not present

## 2023-09-07 DIAGNOSIS — M79642 Pain in left hand: Secondary | ICD-10-CM | POA: Diagnosis not present

## 2023-09-07 DIAGNOSIS — L03114 Cellulitis of left upper limb: Secondary | ICD-10-CM | POA: Diagnosis not present

## 2023-09-07 DIAGNOSIS — S59912A Unspecified injury of left forearm, initial encounter: Secondary | ICD-10-CM | POA: Diagnosis not present

## 2023-09-07 DIAGNOSIS — Z880 Allergy status to penicillin: Secondary | ICD-10-CM | POA: Diagnosis not present

## 2023-09-09 DIAGNOSIS — C8590 Non-Hodgkin lymphoma, unspecified, unspecified site: Secondary | ICD-10-CM | POA: Diagnosis not present

## 2023-09-09 DIAGNOSIS — D649 Anemia, unspecified: Secondary | ICD-10-CM | POA: Diagnosis not present

## 2023-09-09 DIAGNOSIS — E119 Type 2 diabetes mellitus without complications: Secondary | ICD-10-CM | POA: Diagnosis not present

## 2023-09-10 DIAGNOSIS — E119 Type 2 diabetes mellitus without complications: Secondary | ICD-10-CM | POA: Diagnosis not present

## 2023-09-10 DIAGNOSIS — R5381 Other malaise: Secondary | ICD-10-CM | POA: Diagnosis not present

## 2023-09-11 ENCOUNTER — Ambulatory Visit: Payer: Medicare PPO | Attending: Cardiology | Admitting: Cardiology

## 2023-09-11 ENCOUNTER — Encounter: Payer: Self-pay | Admitting: Cardiology

## 2023-09-11 DIAGNOSIS — M6281 Muscle weakness (generalized): Secondary | ICD-10-CM | POA: Diagnosis not present

## 2023-09-11 DIAGNOSIS — G309 Alzheimer's disease, unspecified: Secondary | ICD-10-CM | POA: Diagnosis not present

## 2023-09-11 DIAGNOSIS — E119 Type 2 diabetes mellitus without complications: Secondary | ICD-10-CM | POA: Diagnosis not present

## 2023-09-11 DIAGNOSIS — L89626 Pressure-induced deep tissue damage of left heel: Secondary | ICD-10-CM | POA: Diagnosis not present

## 2023-09-11 DIAGNOSIS — L89150 Pressure ulcer of sacral region, unstageable: Secondary | ICD-10-CM | POA: Diagnosis not present

## 2023-09-11 DIAGNOSIS — L89514 Pressure ulcer of right ankle, stage 4: Secondary | ICD-10-CM | POA: Diagnosis not present

## 2023-09-11 NOTE — Progress Notes (Deleted)
 Clinical Summary [name] is a 83 y.o.male  seen today for follow up of the following medical problems.    1.SOB/DOE/chest pain - from notes previously evaluated by pulmonary - history COPD GOLD 2 s/p RULobectomy/ RT pneumonitis     -often worst with fumes, cigarette smoke - uses motorized scooter typically, chronic leg weakness and severe neuropathy.  - sedentary lifetyle.  - DOE with walking short distances, which has progressed - some LE edema at times - occasional chest tightness. Left sided, 5/10 in severity. Tends to occur with activity. No other associated symptoms. Better with position. Lasts about 15 minutes. Occurs 3-4 times a week   CAD risk factors: HTN, HL, DM2, former smoker    Jan 2024 echo: LVEF 55-60%, grade I dd   - some progression of SOB since last visit - breathing worst in extreme heat.  - some exertional chest pains.  - PET scan showed some coronary atherosclerosis     08/2022 nuclear stress no ischemia    2.History of lung cancer - COPD GOLD 2 s/p RULobectomy/ RT pneumonitis     3. AAA - 3.4 cm by PET scan, needs repeat imaging 3 years   4. HTN - has not taken meds yet today   5. Afib -    6. Pulmonary embolism - noted during 07/2023 admission, presented with abdominal pain. CT imaging subsequently showed bilateral PEs - Lower extremity Dopplers today show DVT's in right common femoral vein and proximal deep femoral vein  - 07/2023 echo: LVEF >55%, normal RV function - had been on eliquis  at last cardiology appt 02/2023 for afib, looks like stopped by another provider. Details unclear. Now on for DVT/PE treatment.   7.Lymphoma -    8.Suprapubic catheter.  - reoccurring issues with UTI Past Medical History:  Diagnosis Date   Acquired bladder diverticulum    12/ 2012  resection diverticulum done at [facility] in [place], [name]   Age-related cataract of right eye    scheduled for catarat extraction 09-12-2017   Agent orange exposure     Aneurysm of infrarenal abdominal aorta (HCC)    first dx 2017--- last CT 06-11-2017  measures 3.5cm   Anxiety    Chronic constipation    COPD with emphysema (HCC)    06--12-2017  per pt no symptoms since Feb 2019   Depression    PTSD   Diabetes mellitus type 2, diet-controlled (HCC)    Diabetic neuropathy (HCC)    Dyslipidemia    Erectile dysfunction    Feeling of incomplete bladder emptying    Gait abnormality 01/06/2020   GERD (gastroesophageal reflux disease)    Gout    09-02-2017 last flare up 3 months ago   Hiatal hernia    History of atrial fibrillation    episode post op right lung lobectomy 07-04-2015   History of bladder stone    History of colon polyps    History of DVT of lower extremity    03/ 2019  bilateral lower extremity (superficial) ---  per treated w/ oral medication    History of gastritis    History of kidney stones    History of radiation therapy 09-14-2015  to 09-21-2015   left upper lung nodule -- 54Gy in 3 fractions (18 Gy per fraction)   Hyperplasia of prostate with lower urinary tract symptoms (LUTS)    Hypertension    Lumbar spinal stenosis    Nephrolithiasis    Neurogenic bladder  Osteoarthritis    ankle, hands   Osteoporosis    Prostate cancer [facility]) urologist-  dr Jonathan [name]/  oncologist-  dr Jonathan [name]   dx 05-24-2017-- Stage T2b,  Gleason 4+4,  PSA 22.41,  vol 38cc--- started ADT 04/ 2019,  plan external radiation therapy   Radiation fibrosis of lung (HCC)    hx left upper long nodule SBRT 06/ 2017   Squamous cell carcinoma of both lungs (HCC) last CT in epic dated 06-26-2017 no recurrence   dx 03/ 2017  non-small cell SCC via bronchoscopy w/ bx's by dr Jonathan [name]---  s/p  right VATS w/ right lobectomy and node dissection's 07-04-2015 /  pt had SBRT to left upper nodule Stage 1 ([facility]) completed 09-21-2015   Vertigo 2018   Wears dentures    bottom   Wears glasses      No Active Allergies   Current Outpatient Medications   Medication Sig Dispense Refill   albuterol  (PROVENTIL ) (2.5 MG/3ML) 0.083% nebulizer solution Take 3 mLs (2.5 mg total) by nebulization every 6 (six) hours as needed for shortness of breath. 75 mL 1   atorvastatin  (LIPITOR) 20 MG tablet Take 20 mg by mouth daily.     diclofenac Sodium (VOLTAREN) 1 % GEL Apply 2 g topically 4 (four) times daily as needed (pain).     Docusate Sodium  (DSS) 100 MG CAPS Take 100 mg by mouth daily.     donepezil  (ARICEPT ) 10 MG tablet Take 10 mg by mouth daily.     escitalopram  (LEXAPRO ) 5 MG tablet Take 5 mg by mouth daily.     feeding supplement (ENSURE ENLIVE / ENSURE PLUS) LIQD Take 237 mLs by mouth 2 (two) times daily between meals. 237 mL 12   FREESTYLE LITE test strip      gabapentin  (NEURONTIN ) 800 MG tablet Take 800 mg by mouth daily.     Incontinence Supply Disposable ([employer] MENS GUARD) MISC 1 Pad by Does not apply route 2 (two) times daily. 60 each 11   Incontinence Supply Disposable (DEPEND REAL FIT/BRIEF/MEN/L-XL) MISC 1 Pad by Does not apply route in the morning and at bedtime. 60 each 11   iron  polysaccharides (NIFEREX) 150 MG capsule Take 1 capsule (150 mg total) by mouth daily. 30 capsule 0   Lancets (FREESTYLE) lancets      latanoprost  (XALATAN ) 0.005 % ophthalmic solution Place 1 drop into both eyes at bedtime.     levofloxacin  (LEVAQUIN ) 500 MG tablet Take 1 tablet (500 mg total) by mouth daily. 7 tablet 0   LINZESS  72 MCG capsule Take 72 mcg by mouth daily before breakfast.     meclizine  (ANTIVERT ) 12.5 MG tablet Take 6.25-12.5 mg by mouth every 6 (six) hours as needed for dizziness.     methocarbamol  (ROBAXIN ) 500 MG tablet Take 1 tablet (500 mg total) by mouth 2 (two) times daily as needed for muscle spasms. 20 tablet 0   metoCLOPramide  (REGLAN ) 5 MG tablet Take 5 mg by mouth daily as needed for vomiting or nausea.     mupirocin  cream (BACTROBAN ) 2 % Apply 1 application topically 2 (two) times daily as needed (wound care).     Oxycodone  HCl 20  MG TABS Take 5 mg by mouth See admin instructions. Takes 1/4 of a pill 4 times a day  0   polyethylene glycol (MIRALAX  / GLYCOLAX ) 17 g packet Take 17 g by mouth daily as needed for mild constipation. 14 each 0   potassium chloride  SA (KLOR-CON  M)  20 MEQ tablet Take 1 tablet (20 mEq total) by mouth 2 (two) times daily. 5 tablet 0   predniSONE  (DELTASONE ) 20 MG tablet Take 20 mg by mouth daily as needed (unknown).     psyllium (METAMUCIL SMOOTH TEXTURE) 58.6 % powder Take 1 packet by mouth daily.     ROCKLATAN  0.02-0.005 % SOLN Place 1 drop into both eyes at bedtime.     scopolamine (TRANSDERM-SCOP) 1 MG/3DAYS 1 patch every 3 (three) days.     sildenafil  (VIAGRA ) 100 MG tablet Take 1 tablet (100 mg total) by mouth daily as needed for erectile dysfunction. 30 tablet 3   SPIRIVA  HANDIHALER 18 MCG inhalation capsule Place 18 mcg into inhaler and inhale as needed.     No current facility-administered medications for this visit.     Past Surgical History:  Procedure Laterality Date   BIOPSY  05/29/2019   Procedure: BIOPSY;  Surgeon: Ruby Corporal, MD;  [facility];  Service: Endoscopy;;  gastric ulcer   CARPAL TUNNEL RELEASE Right 07/09/2014   Procedure: RIGHT OPEN CARPAL TUNNEL RELEASE;  Surgeon: Arnie Lao, MD;  Location: [facility];  Service: Orthopedics;  Laterality: Right;   CHOLECYSTECTOMY N/A 02/24/2014   Procedure: LAPAROSCOPIC CHOLECYSTECTOMY;  Surgeon: Oza Blumenthal, MD;  Location: [facility];  Service: General;  Laterality: N/A;   COLONOSCOPY     [facility]ST[facility]OT[facility]  [facility] [place]   CYSTOSCOPY N/A 04/18/2018   Procedure: CYSTOSCOPY FLEXIBLE;  Surgeon: Homero Luster, MD;  Location: [facility];  Service: Urology;  Laterality: N/A;   CYSTOSCOPY W/ LITHOLAPAXY / EHL  02-24-2007   dr Levi Real  [facility]   dental implant     No teeth at this time waiting for them to be made as of 01-30-19   ESOPHAGOGASTRODUODENOSCOPY (EGD) WITH PROPOFOL  N/A 05/29/2019    Procedure: ESOPHAGOGASTRODUODENOSCOPY (EGD) WITH PROPOFOL ;  Surgeon: Ruby Corporal, MD;  [facility];  Service: Endoscopy;  Laterality: N/A;  925   GOLD SEED IMPLANT N/A 09/05/2017   Procedure: GOLD SEED IMPLANT;  Surgeon: Homero Luster, MD;  Location: [facility];  Service: Urology;  Laterality: N/A;   INSERTION OF SUPRAPUBIC CATHETER N/A 04/18/2018   Procedure: SUPRAPUBIC TUBE CHANGE;  Surgeon: Homero Luster, MD;  Location: [facility];  Service: Urology;  Laterality: N/A;   IR CATHETER TUBE CHANGE  02/24/2018   IR CYSTOSTOMY TUBE PLACEMENT/BLADDER ASPIRATION  07/26/2023   LUMBAR LAMINECTOMY/DECOMPRESSION MICRODISCECTOMY Right 03/31/2021   Procedure: Bilateral Lumbar Three- Four, Lumbar Four-Five Laminectomy, Decompression;  Surgeon: Audie Bleacher, MD;  Location[facility];  Service: Neurosurgery;  Laterality: Right;   SPACE OAR INSTILLATION N/A 09/05/2017   Procedure: SPACE OAR INSTILLATION;  Surgeon: Homero Luster, MD;  Location: [facility];  Service: Urology;  Laterality: N/A;   TOTAL ANKLE ARTHROPLASTY Right 04/20/2015   Procedure: TOTAL ANKLE ARTHOPLASTY;  Surgeon: Timothy Ford, MD;  Location[facility];  Service: Orthopedics;  Laterality: Right;   TRANSTHORACIC ECHOCARDIOGRAM  11/16/2012   ef 55-60%,  grade 1 diastolic dysfunction/  mild dilated ascending aorta/  mild MR/ trivial TR   TRANSURETHRAL RESECTION OF PROSTATE N/A 02/03/2019   Procedure: TRANSURETHRAL RESECTION OF THE PROSTATE (TURP);  Surgeon: Homero Luster, MD;  [facility];  Service: Urology;  Laterality: N/A;   VIDEO ASSISTED THORACOSCOPY (VATS)/ LOBECTOMY Right 07/04/2015   Procedure: VIDEO ASSISTED THORACOSCOPY (VATS)/ RIGHT UPPER LOBECTOMY;  Surgeon: Zelphia Higashi, MD;  Location: [facility];  Service: Thoracic;  Laterality: Right;   VIDEO BRONCHOSCOPY N/A 06/17/2015   Procedure: VIDEO BRONCHOSCOPY;  Surgeon: Zelphia Higashi, MD;  Location: [facility];  Service: Thoracic;  Laterality: N/A;   VIDEO  BRONCHOSCOPY WITH ENDOBRONCHIAL NAVIGATION N/A 06/17/2015   Procedure: VIDEO BRONCHOSCOPY WITH ENDOBRONCHIAL NAVIGATION;  Surgeon: Zelphia Higashi, MD;  [facility];  Service: Thoracic;  Laterality: N/A;   VIDEO BRONCHOSCOPY WITH ENDOBRONCHIAL ULTRASOUND N/A 06/17/2015   Procedure: VIDEO BRONCHOSCOPY WITH ENDOBRONCHIAL ULTRASOUND;  Surgeon: Zelphia Higashi, MD;  Location[facility];  Service: Thoracic;  Laterality: N/A;     No Active Allergies    Family History  Problem Relation Age of Onset   Cancer Father        Asbestos   Colon cancer Neg Hx    Colon polyps Neg Hx    Kidney disease Neg Hx    Esophageal cancer Neg Hx    Gallbladder disease Neg Hx    Heart disease Neg Hx    Diabetes Neg Hx      Social History [name] reports that he quit smoking about 8 years ago. His smoking use included cigarettes. He started smoking about 48 years ago. He has a 20 pack-year smoking history. He has never used smokeless tobacco. Mr. Krontz reports no history of alcohol  use.   Review of Systems CONSTITUTIONAL: No weight loss, fever, chills, weakness or fatigue.  HEENT: Eyes: No visual loss, blurred vision, double vision or yellow sclerae.No hearing loss, sneezing, congestion, runny nose or sore throat.  SKIN: No rash or itching.  CARDIOVASCULAR:  RESPIRATORY: No shortness of breath, cough or sputum.  GASTROINTESTINAL: No anorexia, nausea, vomiting or diarrhea. No abdominal pain or blood.  GENITOURINARY: No burning on urination, no polyuria NEUROLOGICAL: No headache, dizziness, syncope, paralysis, ataxia, numbness or tingling in the extremities. No change in bowel or bladder control.  MUSCULOSKELETAL: No muscle, back pain, joint pain or stiffness.  LYMPHATICS: No enlarged nodes. No history of splenectomy.  PSYCHIATRIC: No history of depression or anxiety.  ENDOCRINOLOGIC: No reports of sweating, cold or heat intolerance. No polyuria or polydipsia.  Aaron Aas   Physical  Examination There were no vitals filed for this visit. There were no vitals filed for this visit.  Gen: resting comfortably, no acute distress HEENT: no scleral icterus, pupils equal round and reactive, no palptable cervical adenopathy,  CV Resp: Clear to auscultation bilaterally GI: abdomen is soft, non-tender, non-distended, normal bowel sounds, no hepatosplenomegaly MSK: extremities are warm, no edema.  Skin: warm, no rash Neuro:  no focal deficits Psych: appropriate affect   Diagnostic Studies  2014 echo Study Conclusions  - Left ventricle: The cavity size was normal. Systolic   function was normal. The estimated ejection fraction was   in the range of 55% to 60%. Wall motion was normal; there   were no regional wall motion abnormalities. Doppler   parameters are consistent with abnormal left ventricular   relaxation (grade 1 diastolic dysfunction). - Mitral valve: Mild regurgitation. - Right ventricle: The cavity size was mildly dilated. Wall   thickness was normal.   Jan 2024 echo 1. Left ventricular ejection fraction, by estimation, is 55 to 60%. The  left ventricle has normal function. The left ventricle has no regional  wall motion abnormalities. Left ventricular diastolic parameters are  consistent with Grade I diastolic  dysfunction (impaired relaxation).   2. Right ventricular systolic function is normal. The right ventricular  size is normal. Tricuspid regurgitation signal is inadequate for  assessing  PA pressure.   3. The mitral valve is normal in structure. Trivial mitral valve  regurgitation. No evidence of mitral stenosis.   4. The aortic valve has an indeterminant number of cusps. There is mild  calcification of the aortic valve. There is mild thickening of the aortic  valve. Aortic valve regurgitation is not visualized. No aortic stenosis is  present.   5. Aortic dilatation noted. There is mild dilatation of the aortic root,  measuring 41 mm.   6. The  inferior vena cava is normal in size with greater than 50%  respiratory variability, suggesting right atrial pressure of 3 mmHg.     08/2022 nuclear stress The study is low risk.   No ST deviation was noted.   There is large moderate intensity inferior defect with mild reversibility. The defect has relatively preserved wall motion. There is significant adjacent gut radiotracer uptake that likely affects findings. Inferior infact with mild peri-infarct ischemia vs gut related artifact, either finding would support low risk   Left ventricular function is normal. Nuclear stress EF: 63 %. The left ventricular ejection fraction is normal (55-65%). End diastolic cavity size is normal.    Assessment and Plan    1.SOB/DOE/chest pain - may be related purely to his extensive pulmonary history, significant deconditioning limited by severe neuropathy. With reported LE edema and chest pains cannot exclude a cardiac component. Multiple CAD risk factors - echo was benigin - with exertional chest pain and coronary atherosclerosis on recent PET scan will plan for lexiscan  to further evaluate   2. HTN - at goal based on manual recheck, continue current meds       F/u pending lexiscan  results, if benign can f/u just as needed.       Laurann Pollock, M.D., F.A.C.C.

## 2023-09-15 DIAGNOSIS — N390 Urinary tract infection, site not specified: Secondary | ICD-10-CM | POA: Diagnosis not present

## 2023-09-15 DIAGNOSIS — T8459XA Infection and inflammatory reaction due to other internal joint prosthesis, initial encounter: Secondary | ICD-10-CM | POA: Diagnosis not present

## 2023-09-15 DIAGNOSIS — F02B4 Dementia in other diseases classified elsewhere, moderate, with anxiety: Secondary | ICD-10-CM | POA: Diagnosis not present

## 2023-09-15 DIAGNOSIS — I480 Paroxysmal atrial fibrillation: Secondary | ICD-10-CM | POA: Diagnosis not present

## 2023-09-15 DIAGNOSIS — Z8546 Personal history of malignant neoplasm of prostate: Secondary | ICD-10-CM | POA: Diagnosis not present

## 2023-09-15 DIAGNOSIS — E43 Unspecified severe protein-calorie malnutrition: Secondary | ICD-10-CM | POA: Diagnosis not present

## 2023-09-15 DIAGNOSIS — R0600 Dyspnea, unspecified: Secondary | ICD-10-CM | POA: Diagnosis not present

## 2023-09-15 DIAGNOSIS — S91302A Unspecified open wound, left foot, initial encounter: Secondary | ICD-10-CM | POA: Diagnosis not present

## 2023-09-15 DIAGNOSIS — L8990 Pressure ulcer of unspecified site, unspecified stage: Secondary | ICD-10-CM | POA: Diagnosis not present

## 2023-09-15 DIAGNOSIS — M19072 Primary osteoarthritis, left ankle and foot: Secondary | ICD-10-CM | POA: Diagnosis not present

## 2023-09-15 DIAGNOSIS — A419 Sepsis, unspecified organism: Secondary | ICD-10-CM | POA: Diagnosis not present

## 2023-09-15 DIAGNOSIS — L97919 Non-pressure chronic ulcer of unspecified part of right lower leg with unspecified severity: Secondary | ICD-10-CM | POA: Diagnosis not present

## 2023-09-15 DIAGNOSIS — T83510A Infection and inflammatory reaction due to cystostomy catheter, initial encounter: Secondary | ICD-10-CM | POA: Diagnosis not present

## 2023-09-15 DIAGNOSIS — R32 Unspecified urinary incontinence: Secondary | ICD-10-CM | POA: Diagnosis not present

## 2023-09-15 DIAGNOSIS — I2699 Other pulmonary embolism without acute cor pulmonale: Secondary | ICD-10-CM | POA: Diagnosis not present

## 2023-09-15 DIAGNOSIS — Z87891 Personal history of nicotine dependence: Secondary | ICD-10-CM | POA: Diagnosis not present

## 2023-09-15 DIAGNOSIS — Z7401 Bed confinement status: Secondary | ICD-10-CM | POA: Diagnosis not present

## 2023-09-15 DIAGNOSIS — E11621 Type 2 diabetes mellitus with foot ulcer: Secondary | ICD-10-CM | POA: Diagnosis not present

## 2023-09-15 DIAGNOSIS — J449 Chronic obstructive pulmonary disease, unspecified: Secondary | ICD-10-CM | POA: Diagnosis not present

## 2023-09-15 DIAGNOSIS — M7989 Other specified soft tissue disorders: Secondary | ICD-10-CM | POA: Diagnosis not present

## 2023-09-15 DIAGNOSIS — J189 Pneumonia, unspecified organism: Secondary | ICD-10-CM | POA: Diagnosis not present

## 2023-09-15 DIAGNOSIS — D649 Anemia, unspecified: Secondary | ICD-10-CM | POA: Diagnosis not present

## 2023-09-15 DIAGNOSIS — F0390 Unspecified dementia without behavioral disturbance: Secondary | ICD-10-CM | POA: Diagnosis not present

## 2023-09-15 DIAGNOSIS — K219 Gastro-esophageal reflux disease without esophagitis: Secondary | ICD-10-CM | POA: Diagnosis not present

## 2023-09-15 DIAGNOSIS — J90 Pleural effusion, not elsewhere classified: Secondary | ICD-10-CM | POA: Diagnosis not present

## 2023-09-15 DIAGNOSIS — M47816 Spondylosis without myelopathy or radiculopathy, lumbar region: Secondary | ICD-10-CM | POA: Diagnosis not present

## 2023-09-15 DIAGNOSIS — R7881 Bacteremia: Secondary | ICD-10-CM | POA: Diagnosis not present

## 2023-09-15 DIAGNOSIS — Z20822 Contact with and (suspected) exposure to covid-19: Secondary | ICD-10-CM | POA: Diagnosis not present

## 2023-09-15 DIAGNOSIS — I890 Lymphedema, not elsewhere classified: Secondary | ICD-10-CM | POA: Diagnosis not present

## 2023-09-15 DIAGNOSIS — M160 Bilateral primary osteoarthritis of hip: Secondary | ICD-10-CM | POA: Diagnosis not present

## 2023-09-15 DIAGNOSIS — R0902 Hypoxemia: Secondary | ICD-10-CM | POA: Diagnosis not present

## 2023-09-15 DIAGNOSIS — R918 Other nonspecific abnormal finding of lung field: Secondary | ICD-10-CM | POA: Diagnosis not present

## 2023-09-15 DIAGNOSIS — M2042 Other hammer toe(s) (acquired), left foot: Secondary | ICD-10-CM | POA: Diagnosis not present

## 2023-09-15 DIAGNOSIS — L97429 Non-pressure chronic ulcer of left heel and midfoot with unspecified severity: Secondary | ICD-10-CM | POA: Diagnosis not present

## 2023-09-15 DIAGNOSIS — R531 Weakness: Secondary | ICD-10-CM | POA: Diagnosis not present

## 2023-09-15 DIAGNOSIS — N319 Neuromuscular dysfunction of bladder, unspecified: Secondary | ICD-10-CM | POA: Diagnosis not present

## 2023-09-15 DIAGNOSIS — L89150 Pressure ulcer of sacral region, unstageable: Secondary | ICD-10-CM | POA: Diagnosis not present

## 2023-09-15 DIAGNOSIS — I82409 Acute embolism and thrombosis of unspecified deep veins of unspecified lower extremity: Secondary | ICD-10-CM | POA: Diagnosis not present

## 2023-09-15 DIAGNOSIS — T84196A Other mechanical complication of internal fixation device of bone of right lower leg, initial encounter: Secondary | ICD-10-CM | POA: Diagnosis not present

## 2023-09-15 DIAGNOSIS — R000 Tachycardia, unspecified: Secondary | ICD-10-CM | POA: Diagnosis not present

## 2023-09-15 DIAGNOSIS — T8469XA Infection and inflammatory reaction due to internal fixation device of other site, initial encounter: Secondary | ICD-10-CM | POA: Diagnosis not present

## 2023-09-15 DIAGNOSIS — E119 Type 2 diabetes mellitus without complications: Secondary | ICD-10-CM | POA: Diagnosis not present

## 2023-09-15 DIAGNOSIS — I959 Hypotension, unspecified: Secondary | ICD-10-CM | POA: Diagnosis not present

## 2023-09-15 DIAGNOSIS — I4891 Unspecified atrial fibrillation: Secondary | ICD-10-CM | POA: Diagnosis not present

## 2023-09-15 DIAGNOSIS — C8590 Non-Hodgkin lymphoma, unspecified, unspecified site: Secondary | ICD-10-CM | POA: Diagnosis not present

## 2023-09-15 DIAGNOSIS — C3411 Malignant neoplasm of upper lobe, right bronchus or lung: Secondary | ICD-10-CM | POA: Diagnosis not present

## 2023-09-24 DIAGNOSIS — R531 Weakness: Secondary | ICD-10-CM | POA: Diagnosis not present

## 2023-09-24 DIAGNOSIS — Z7401 Bed confinement status: Secondary | ICD-10-CM | POA: Diagnosis not present

## 2023-09-24 NOTE — Nursing Note (Signed)
 Physician Order and Certification Statement for Non-Emergency Ambulance Services  SECTION I - GENERAL INFORMATION AND PHYSICIAN ORDER  Origin: [facility] Room: 303/303-01 [address] [name] [address] Phone: [phone]  Destination: [address] [place], [name] [address]                             [phone] Payer is Medicare, the patient's stay is covered under Medicare Part A.   SECTION II - MEDICAL NECESSITY QUESTIONNAIRE  Ambulance Transportation is medically necessary only if other means of transport are contraindicated or would be potentially harmful to the patient. To meet this requirement, the patient must be either bed confined or suffer from a condition such that transport by means other than ambulance is contraindicated by the patient's condition. The following questions must be answered by the medical professional signing below for this form to be valid: Describe the MEDICAL CONDITION (physical and/or mental) of this patient AT THE TIME OF AMBULANCE TRANSPORT that requires the patient to be transported in an ambulance and why transport by other means is contraindicated by the patient's condition:   Hospice at Home, Hypoxia, Pneumonia, Sepsis, Failure to Thrive , Non- Hodgkins Lymphoma  Is this patient bed confined as defined below?YES     To be bed confined the patient must satisfy all three of the following conditions: (1) unable to get up from bed without Assistance; AND (2) unable to ambulate; AND (3) unable to sit in a chair or wheelchair.  Can this patient safely be transported by car or wheelchair van (i.e., seated during transport, without a medical attendant or monitoring?) NO  In addition to completing questions 1-3 above, please check any of the following conditions that apply*: (*Note: supporting documentation for any boxes checked must be maintained in the patient's medical record.)  Moderate/severe pain on movement and Unable to sit in a chair or wheelchair due to  decubitus ulcers or other wounds  SECTION III - SIGNATURE OF PHYSICIAN OR  HEALTHCARE PROFESSIONAL  I certify that the above information is true and correct based on my current valuation of this patient, and represent that the patient requires transport by ambulance and that other form of transport are contraindicated. I understand that this information will be used by the Centers for Medicare and Medicaid Services (CMS) to support the determination of medical necessity for ambulance services, and I represent that I have personal knowledge of the patient's condition at the time of transport.  I also certify that the patient is physically or mentally incapable of signing the ambulance service's claim and that the institution with which I am affiliated has furnished care, services or assistance to the patient. My signature below is made on behalf of the patient pursuant to 42 CFR 424.36(b)(4). ln accordance with 42 CFR 424.37, the specific reason(s) that the patient is physically or mentally incapable of signing the claim  form is as follows:  Hospice at Home patient with altered mental status  Signature of Healthcare Professional* - Discharge Planner: Champ JONETTA Hummer, RN 09/24/2023 1:56 PM Electronically Signed  Champ JONETTA Hummer, RN Printed Name and Credentials of Physician or Healthcare Professional (MD, DO, RN, etc.) *Form must be signed only by patient's attending physician for scheduled, repetitive transports. For non-repetitive, unscheduled ambulance transports, if unable to obtain the signature of the attending physician, any of the following may sign (please check appropriate box below. A physician order is required if form is signed by  the registered nurse or discharge planner.  Note:  User bears all responsibility for compliance with all applicable laws and regulations.

## 2023-09-24 NOTE — Care Plan (Signed)
  Care Management Final Transition Planning Assessment    09/24/2023  Patient's Post Acute Contact Information: [address] [place], [name] [address]  [phone] Joen  Additional transition plan information: [facility]  Has a PCP appointment been made?: No Is appointment within 7 days of discharge?: N/A Has a specialist appointment been made?: No   Post Acute Facility needed at discharge?: Yes Post Acute Facility: SNF (Skilled Nursing Facility) Facility (Name/Phone #): [facility]    Home Care/ Home Medical Equipment needed at discharge?: Yes Home Care/ Home Medical Equipment: [facility] Provider (Name/Phone #): [facility]      Outpatient/Community Referrals needed for discharge?: No  Outpatient/Community Resources: Clinic  Agency detail (Name/Phone #): [facility] Transportation Anticipated: ambulance    Currently receiving outpatient dialysis?: N/A       Discharge Disposition: Home w/ Hospice     IMM Delivery Follow Up Important Message (IM) letter given to patient and/or family?: Yes IMM Delivered by: The IMM was delivered and signed by authorized representative. IMM Delivery Date: 09/24/23 IMM Delivery Time: 1100 The beneficiary verbalized understanding of the rights of the hospitalized patient and the right to appeal the discharge decision?: Yes The patient/authorized representative was advised they have 4 hours to consider their right to request a QIO review prior to leaving the hospital, if the physician orders discharge today.: Yes The patient/authorized representative verbalized understanding of this right.: Yes IMM Delivery Comments: FUIM. Reminded of discharge rights.  Quality data for continuing care services shared with patient and/or representative?: Yes Patient and/or family were provided with choice of facilities / services that are available and appropriate to meet post  hospital care needs?: Yes  List choices in order highest to lowest preferred, if applicable. : [facility] at [facility]     Final Assessment Complete: Yes                     Readmission Risk Score:  Predictive Model Details        57% (High)  Factor Value   Calculated 09/24/2023 12:07 29% Number of ED visits in last six months 10   [facility] Risk of Unplanned Readmission Model 16% Number of active inpatient medication orders 45    13% Number of hospitalizations in last year 5    5% Diagnosis of cancer present    5% ECG/EKG order present in last 6 months    5% Latest calcium  low (7.6 mg/dL)    5% Current length of stay 9.231 days    4% Charlson Comorbidity Index 7    4% Diagnosis of electrolyte disorder present    3% Imaging order present in last 6 months    3% Age 83    3% Latest hemoglobin low (7.9 g/dL)    2% Active anticoagulant inpatient medication order present    2% Diagnosis of renal failure present    1% Active ulcer inpatient medication order present

## 2023-09-24 NOTE — Discharge Summary (Signed)
 Hospitalist Discharge Summary   Discharge date:   September 24, 2023 Length of stay:    LOS: 9 days    Discharge Service:   [facility] Hospitalists Discharge Attending Physician: Margart Elsie Dragon, DO Discharge to:    To Home with Hospice Condition at Discharge:  poor   Mental Status On day of Discharge:  The patient is Alert and oriented to PERSON The patient is not Alert And oriented to TIME The patient is not Alert and oriented to [facility] course based on timeline of significant events after admission (by date):   ______________________________________   Admission HPI   Patient admitted on: 09/15/2023  4:35 AM  Patient admitted by: Thersia Roger, D.O. CHIEF COMPLAINT:        Chief Complaint  Patient presents with  . Altered Mental Status  . Shortness of Breath      Day of admission HPI:   September 15, 2023 6:12 AM    Patient admitted on Home O2? - No Patient on home anticoagulant? -  Yes Patient admitted with Chronic home foley catheter? - Yes, suprapubic Foley catheter placed or replaced by another service prior to admission? - No   Mental Status on Admission: The patient is NOT Alert and oriented to PERSON The patient is NOT Alert And oriented to TIME The patient is NOT Alert and oriented to LOCATION   This is a 83 y.o. y.o. male with a known history of non-Hodgkins lymphoma, finished treatment in May 2025, COPD, afib, DM2,  prostate cancer (2016) neurogenic bladder with suprapubic catheter x 3 months, history of lung cancer, history of bilateral PEs currently on Eliquis   presents to the emergency department from [facility] for evaluation of  AMS, SOB and hypoxia to the 60s.    Of note he has had a recent admission for, and prior to that he, pseudomonas CAUTI for which he completed Levaquin .  He is currently in rehab at [facility], but has not been able to participate in several days.  Wife is concerned about multiple pressure ulcers at left ankle, left leg and  sacrum.     Otherwise there has been no change in status. Patient has been taking medication as prescribed and there has been no recent change in medication or diet.   In the ED the patient received Zosyn , Vanco, NS.   Medical admission was requested for further workup and management of Sepsis 2/2 HCAP.    Problem List, Assessment & Plan    ASSESSMENT & PLAN (In order of descending acuity) Sepsis 2/2  multiple sources: HCAP, recurrent UTI (due to suprapubic catheter), Multiple open wounds with probable osteo/hardware infection in right ankle - Appreciate ID recommendations, patient has completed antibiotics for open wounds, recommend continued local wound care  Mutiple pressure ulcers/Exposed ankle hardware/severe protein calorie malnutrition - Xrays of left ankle and tibia negative for bony changes however patient does have exposed hardware in right ankle - Continue local wound care, patient has very poor wound healing capability, and would not benefit from any operative intervention at this time - Offload areas as much possible - Continue vitamins - After goals of care discussion with wife Joen plan will be to transition to more of a quality of life approach with home with hospice care   Dementia  - continue buspirone, memantine, donepezil , escitalopram  - at baseline   GERD (gastroesophageal reflux disease)  - Protonix  has been changed to IV twice daily - Hemoccult was positive on 09/22/2023 - Posttransfusion hemoglobin  is 8.3 this morning - No need for EGD at this time, do not suspect brisk GI bleed, patient could hold Eliquis  as we are transitioning to end-of-life/hospice care at home.  Wife understands risk versus benefit of Eliquis , due to recurrent blood transfusions would recommend discontinuing. - Patient did test positive for H. pylori, have prescribed oral antibiotics that patient could take if desired   History of prostate cancer 2016 with radiation therapy, neurogenic  bladder - Suprapubic catheter in place - Sees Dr. Watt urologist outpatient - Appreciate general surgery willing to change out suprapubic catheter   Lymphoma, history of lung cancer - Sees [facility] cancer center - Patient has bone involvement in the pelvis - Right upper lobectomy in 2017 for squamous cell carcinoma of the lung - Per wife they have recommended palliative, and have no further chemo treatment planned - After goals of care discussion with wife and patient he wants to discharge home with hospice care for extra support, wife is aware of poor prognosis   DM2 - Recommend patient liberalize diet as he is discharging home with hospice - Check blood sugar as needed   Atrial fibrillation (paroxysmal)/ bilateral PEs - Difficult decision whether to continue apixaban  or hold, as patient transitioning to hospice and unlikely to provide any additional benefit in the setting of ongoing slow GI blood loss would recommend holding/discontinuing medication for now and continue to reassess if desired   COPD  - continue Spiriva   ADDITIONAL PATIENT FINDINGS OR OBSERVATIONS  Alert and pleasantly conversant 83 year old male.  Wishes to go home.  Lying supine in hospital bed in no acute distress. Lungs clear to auscultation bilaterally, rhonchi or wheezes Heart regular rate and rhythm, no murmur Extremities with multiple open wounds covered by clean bandages, no strikethrough Abdomen soft, nontender, suprapubic cath draining straw-colored urine  DVT prophylaxis while in hospital:  on other anticoagulant (apixaban , rivaroxaban, dabigatran, prasugrel)   _____________________________________  Vital Signs: BP 136/82   Pulse 92   Temp 36.5 C (97.7 F)   Resp 17   Ht 203.2 cm (6' 8)   Wt 100.6 kg (221 lb 12.8 oz)   SpO2 96%   BMI 24.37 kg/m   Nutrition:                                  Diet Instructions     Discharge diet (specify)     Discharge Nutrition Therapy: Heart  Healthy       Non-severe (Moderate) Protein-Calorie Malnutrition in the context of chronic illness (09/16/23 1545)     Energy Intake: < 75% of estimated energy requirement for > or equal to 1 month Interpretation of Wt. Loss: > or equal to 7.5% x 3 month Fat Loss: Moderate Muscle Loss: Moderate        CODE STATUS :                    DNR and DNI   Patient discharged on Home O2? - no Patient discharged on home anticoagulant? -  no Patient discharged with Chronic home foley catheter? - yes  Time Spent on Discharge I spent greater than 30 minutes counseling and coordinating care for the discharge of this patient. The patient, wife, and I discussed the importance of outpatient follow-up as well as concerning signs and symptoms that would require immediate evaluation by a medical professional. The aforementioned conversation participants understand  and did show  insight. I did use teachback to ensure understanding. The above participant/s is aware that not following the discussed plan, recommendations, and follow up can lead to severe negative effects on the patient's health, up to and including death.    Discharge Medications     Your Medication List     START taking these medications    amiodarone  200 MG tablet Commonly known as: PACERONE  Take 1 tablet (200 mg total) by mouth daily. Start taking on: September 25, 2023   metroNIDAZOLE 500 MG tablet Commonly known as: FLAGYL Take 1 tablet (500 mg total) by mouth Three (3) times a day for 10 days.   PINK BISMUTH 262 mg Chew Generic drug: bismuth subsalicylate Chew 2 tablets (524 mg total) four (4) times a day.   tetracycline 500 MG capsule Commonly known as: SUMYCIN Take 1 capsule (500 mg total) by mouth four (4) times a day for 10 days.       CONTINUE taking these medications    albuterol  2.5 mg /3 mL (0.083 %) nebulizer solution Inhale 3 mL (2.5 mg total) by nebulization every six (6) hours as needed for shortness  of breath or wheezing.   ascorbic acid (vitamin C) 500 MG tablet Commonly known as: VITAMIN C Take 1 tablet (500 mg total) by mouth daily.   busPIRone 5 MG tablet Commonly known as: BUSPAR Take 1 tablet (5 mg total) by mouth Three (3) times a day.   donepezil  5 MG tablet Commonly known as: ARICEPT  Take 1 tablet (5 mg total) by mouth nightly.   escitalopram  oxalate 5 MG tablet Commonly known as: LEXAPRO  Take 1 tablet (5 mg total) by mouth in the morning.   ferrous sulfate 325 (65 FE) MG EC tablet Take 1 tablet (325 mg total) by mouth Three (3) times a day with a meal.   gabapentin  400 MG capsule Commonly known as: NEURONTIN  Take 2 capsules (800 mg total) by mouth Three (3) times a day.   latanoprost  0.005 % ophthalmic solution Commonly known as: XALATAN  Apply 1 drop to eye at bedtime.   memantine 10 MG tablet Commonly known as: NAMENDA Take 1 tablet (10 mg total) by mouth daily.   pantoprazole  40 MG tablet Commonly known as: Protonix  Take 1 tablet (40 mg total) by mouth daily before breakfast.   polyethylene glycol 17 gram packet Commonly known as: MIRALAX  Take 17 g by mouth daily as needed.   tiotropium 18 mcg inhalation capsule Commonly known as: SPIRIVA  Place 1 capsule (18 mcg total) into inhaler and inhale daily as needed.       ASK your doctor about these medications    apixaban  5 mg Tab Commonly known as: ELIQUIS  Take 1 tablet (5 mg total) by mouth two (2) times a day. Ask about: Should I take this medication?       ____________________________________________  Discharge Instructions   Nutrition:                                  Diet Instructions     Discharge diet (specify)     Discharge Nutrition Therapy: Heart Healthy       Activity:                                   Activity Instructions     Activity as tolerated  Appointments:                          Follow Up:                              Follow Up instructions and  Outpatient Referrals    Ambulatory Referral to Palliative Care     Reason for referral: Spouse requested referral to Palliative care   Is this a palliative referral for:  goals of care coping support symptom management     Requested follow up plan: You would evaluate and manage.   Ambulatory Referral to Home Health     Reason for referral: PT RN Assistance with IV ABX, PortaCath   Physician to follow patient's care: PCP   Disciplines requested:  Physical Therapy Nursing     Nursing requested: IV/Infusion per home infusion order   Drug class: Antibiotics   Pharmacy to dose: Yes   Start Date: 09/19/2023   Stop Date: 09/23/2023   Labs per pharmacy: Yes   Line care per protocol: Yes   Type of line: Port a cath   Physical Therapy requested:  Home safety evaluation Evaluate and treat     Requested [facility] Date: 09/20/2023   Special instructions: Patient will go home with palliative  are through  [facility], IV antipiotics for 5 days, Needs RN to assess Porta Cath and  assist with Medication   Requested follow up plan: You would evaluate and manage.   Call MD for:  difficulty breathing, headache or visual disturbances     Call MD for:  extreme fatigue     Call MD for:  persistent dizziness or light-headedness     Call MD for:  persistent nausea or vomiting     Call MD for: Temperature > 38.5 Celsius ( > 101.3 Fahrenheit)     Discharge instructions     Ambulatory Referral to Orthopedics     Reason for referral: Please evaluate and treat exposed hardware right  ankle   Specific Service Requested: Joint   Choose joint: Ankle/Foot   Is this referral for a new fracture?: No   Requested follow up plan: You would evaluate and manage.       Allergies  Allergies[1]    Past Medical History  Past Medical History[2]     Lab Results   Recent Labs    Units 09/18/23 0803 09/18/23 1547 09/20/23 9476 09/22/23 0502 09/22/23 9391 09/22/23 1152 09/22/23 2025 09/23/23 0026 09/23/23 9463  09/23/23 9161 09/23/23 1221 09/24/23 0452  WBC 10*9/L 13.3*  --  17.6* 9.4  --   --   --   --   --   --   --  6.9  HGB g/dL 7.2*   < > 9.2* 7.3*   < > 7.0* 7.5* 7.9* 8.2* 8.3* 8.0* 7.9*  HCT % 22.3*   < > 28.7* 23.2*   < > 21.8* 23.4* 24.3* 25.4* 25.6* 25.1* 24.7*  PLT 10*9/L 182  --  212 171  --   --   --   --   --   --   --  157   < > = values in this interval not displayed.   Recent Labs    Units 09/18/23 0803 09/19/23 0552 09/20/23 0523 09/22/23 0503 09/23/23 0536  NA mmol/L 138  --  139 142  --   K mmol/L 3.3*  --  3.4*  3.2*  --   CL mmol/L 106  --  107 110*  --   CO2 mmol/L 26.7  --  24.8 23.5  --   BUN mg/dL 12  --  9 10  --   CREATININE mg/dL 9.51* 9.52* 9.54* 9.48* 0.53*  GLU mg/dL 89  --  78 78  --   CALCIUM  mg/dL 7.4*  --  7.4* 7.6*  --   ALBUMIN  g/dL 1.1*  --  1.1* 1.0*  --   PROT g/dL 4.9*  --  5.3* 4.8*  --   BILITOT mg/dL 0.4  --  0.7 0.3  --   AST U/L 16  --  18 17  --   ALT U/L 7*  --  8* <6*  --   ALKPHOS U/L 67  --  73 60  --   MG mg/dL 1.4*  --   --  1.5*  --    No results for input(s): CKTOTAL, TROPONINI, TROPONINT, CKMB, EDTPNI, BNP, CHOL, LDL, HDL, TRIG in the last 168 hours. No results for input(s): INR, LABPROT, APTT, DDIMER in the last 168 hours. No results for input(s): TSH, ETOH, ACETAMIN, SALICYLATE, FREET3, T4FREE, A1C, TTR, ESR, CRP, HSCRP, ANA, RF, VITAMINB12, FOLATE, IRON , LABIRON, TIBC, FERRITIN, RETIC, RETICCTPCT, C3, C4, LDH, HAPTM, URICACID, HEPP4, CEA, RAPSCRN, CDIFRPCR, CDIFFNAP1, HIV12SCRN, HIVCP, TBCELLQN in the last 168 hours.  Invalid input(s): HIVSCRN  No results for input(s): HEPAIGM, HEPBSAG, HEPBIGM, HEPCAB, MITOAB in the last 168 hours. No results for input(s): WBCUA, NITRITE, LEUKOCYTESUR, BACTERIA, RBCUA, BLOODU, GLUCOSEU, PROTEINUA, KETONESU in the last 168 hours. No results for input(s): KETUR,  PREGTESTUR, PREGPOC, NAURINE, LABOSMO, OSMOFT, PROTEINUR, CREATUR, PCRATIOUR, OPIAU, BENZU, TRICYCLIC, PCPU, AMPHU, COCAU, CANNAU, BARBU, URPROTELEC in the last 168 hours. No results for input(s): FTYP1, WBCFLUID, FNEUT, LYMPHSFL, FMONO, EOSFL, RBCFL, CLARITYFLUID, COLORFL in the last 168 hours. No results for input(s): O2SOUR, FIO2ART, PHART, PCO2ART, PO2ART, HCO3ART, O2SATART, BEART in the last 72 hours.  Imaging  ECG 12 Lead Result Date: 09/16/2023 Atrial fibrillation with rapid ventricular response with premature ventricular or aberrantly conducted complexes Low voltage QRS Nonspecific T wave abnormality Abnormal ECG When compared with ECG of 15-Sep-2023 04:56, Atrial fibrillation has replaced Sinus rhythm Criteria for Anteroseptal infarct are no longer present ST no longer depressed in Lateral leads T wave inversion no longer evident in Anterior leads  XR Sacrum And Coccyx Result Date: 09/15/2023 Exam:  Sacrum and Coccyx  History:  Altered mental status.  Concern for osteomyelitis.  Technique:  3 views.  Comparison:  CT of the chest, abdomen, and pelvis dated 08/26/2023.  Findings:  Bilateral iliac lytic foci are present as seen on prior CT imaging.  There are no visible fractures.  Bony alignment is normal.  The SI joints are symmetric.  The symphysis pubis is normally aligned.  Bilateral hip degenerative changes are present, left greater than right.  Disc and facet degenerative changes are visualized within the lower lumbar spine.  There is no discrete sacrococcygeal pathology identified.    1.    No acute osseous pathology identified. 2.    Bilateral iliac lytic foci as seen on prior CT imaging. 3.    Degenerative changes within the lower lumbar spine and bilateral hips.  Signed (Electronic Signature): 09/15/2023 7:59 AM Signed By: Cleatus SHAUNNA Brazier, MD  ECG 12 Lead Result Date: 09/15/2023 Sinus tachycardia with premature  supraventricular complexes and with occasional premature ventricular complexes Anteroseptal infarct (cited on or before 18-Aug-2023) Abnormal ECG When compared with ECG  of 28-Aug-2023 11:48, premature ventricular complexes are now present premature supraventricular complexes are now present Non-specific change in ST segment in Inferior leads ST now depressed in Lateral leads Nonspecific T wave abnormality now evident in Inferior leads T wave inversion now evident in Anterior leads Confirmed by Cherie Searle ([id number]) on 09/15/2023 5:35:34 AM  XR Foot 2 Views Left Result Date: 09/15/2023 Exam:  Left Foot  History:  Multiple wounds, evaluate for osteomyelitis  Technique:  2 views  Comparison:  None.  Findings:  Normal mineralization. No acute fracture or malalignment. First MTP osteoarthritis with joint space narrowing and subchondral sclerosis. Multiple hammertoe deformities. Dorsal talonavicular osteophyte formation. No osseous erosion or periosteal reaction. Negative for soft tissue gas or retained radiopaque foreign body.    1.    No radiographic evidence for osteomyelitis. 2.    Degenerative changes as detailed.  Signed (Electronic Signature): 09/15/2023 5:22 AM Signed By: Alm Platt, MD  XR Tibia Fibula Right Result Date: 09/15/2023 Exam:  Right Tibia and Fibula  History:  Multiple wounds, evaluate for osteomyelitis  Technique:  2 views  Comparison:  None.  Findings:  Distal fibular ORIF hardware and tibiotalar prosthesis. No periprosthetic lucency or overt failure. No osseous erosions or periosteal reaction. No acute fracture or malalignment. Soft tissue swelling about the ankle, particularly the medial malleolus. No soft tissue gas.    1.    No radiographic evidence for osteomyelitis. 2.    Soft tissue swelling about the ankle, particularly the medial malleolus. No soft tissue gas.  Signed (Electronic Signature): 09/15/2023 5:21 AM Signed By: Alm Platt, MD  XR Chest Portable Result Date:  09/15/2023 Exam:  Portable Chest  History:  Dyspnea  Technique:  Single frontal view.  Comparison:  08/28/2023  Findings:   Right chest wall port, tip within the deep SVC. Right lung is clear. Left basilar heterogeneous pulmonary opacity with associated small pleural effusion. Heterogeneous opacity also extends into the medial left upper lobe. No pneumothorax. Stable regional skeleton.    Heterogeneous left lung opacity consistent with pneumonia. Associated small effusion.  Signed (Electronic Signature): 09/15/2023 5:19 AM Signed By: Alm Platt, MD   Margart Dragon, DO Hospitalist, [facility] 09/24/23, 2:44 PM      [1] Allergies Allergen Reactions  . Ampicillin  Other (See Comments)    Don't work Denies allergy Pt tolerated PCNs/cephs in past  . Lisinopril Other (See Comments)    Not tolerated well Denies allergy  . Ciprofloxacin  Other (See Comments)    Doesn't work  . Levofloxacin  Other (See Comments)    Doesn't work  . Opioids - Morphine  Analogues Hives    Had urticaria following the vein where it was injected  [2] Past Medical History: Diagnosis Date  . Asthma (HHS-HCC)   . Atrial fibrillation      . Cancer      . Chronic kidney disease   . Colon polyp   . Diabetes mellitus      . GERD (gastroesophageal reflux disease)   . Hypertension   . Large cell lymphoma      . Lung cancer      . Prostate cancer

## 2023-09-25 NOTE — Care Plan (Signed)
    After a thorough review of the chart this patient does not meet criteria for a Transitions Call at this time. Reason for Disqualification : Discharged to [employer]. Please contact [phone] with any further questions.                      DAWNYETTA L TAYLOR, RN

## 2023-09-30 DIAGNOSIS — T84498A Other mechanical complication of other internal orthopedic devices, implants and grafts, initial encounter: Secondary | ICD-10-CM | POA: Diagnosis not present

## 2023-10-09 DIAGNOSIS — J189 Pneumonia, unspecified organism: Secondary | ICD-10-CM | POA: Diagnosis not present

## 2023-10-25 DEATH — deceased
